# Patient Record
Sex: Female | Born: 1945
Health system: Southern US, Community
[De-identification: ages and names within clinical notes are randomized; demographics above are authoritative.]

## PROBLEM LIST (undated history)

## (undated) DIAGNOSIS — H269 Unspecified cataract: Secondary | ICD-10-CM

## (undated) DIAGNOSIS — Z8489 Family history of other specified conditions: Secondary | ICD-10-CM

## (undated) DIAGNOSIS — I10 Essential (primary) hypertension: Secondary | ICD-10-CM

## (undated) DIAGNOSIS — I2510 Atherosclerotic heart disease of native coronary artery without angina pectoris: Secondary | ICD-10-CM

## (undated) DIAGNOSIS — M1990 Unspecified osteoarthritis, unspecified site: Secondary | ICD-10-CM

## (undated) DIAGNOSIS — D649 Anemia, unspecified: Secondary | ICD-10-CM

## (undated) DIAGNOSIS — E785 Hyperlipidemia, unspecified: Secondary | ICD-10-CM

## (undated) DIAGNOSIS — E119 Type 2 diabetes mellitus without complications: Secondary | ICD-10-CM

## (undated) DIAGNOSIS — I119 Hypertensive heart disease without heart failure: Secondary | ICD-10-CM

## (undated) DIAGNOSIS — D126 Benign neoplasm of colon, unspecified: Secondary | ICD-10-CM

## (undated) DIAGNOSIS — K219 Gastro-esophageal reflux disease without esophagitis: Secondary | ICD-10-CM

## (undated) DIAGNOSIS — N184 Chronic kidney disease, stage 4 (severe): Secondary | ICD-10-CM

## (undated) DIAGNOSIS — I639 Cerebral infarction, unspecified: Secondary | ICD-10-CM

## (undated) DIAGNOSIS — Z9289 Personal history of other medical treatment: Secondary | ICD-10-CM

## (undated) HISTORY — DX: Chronic kidney disease, stage 4 (severe): N18.4

## (undated) HISTORY — PX: REDUCTION MAMMAPLASTY: SUR839

## (undated) HISTORY — DX: Anemia, unspecified: D64.9

## (undated) HISTORY — DX: Type 2 diabetes mellitus without complications: E11.9

## (undated) HISTORY — DX: Cerebral infarction, unspecified: I63.9

## (undated) HISTORY — DX: Unspecified osteoarthritis, unspecified site: M19.90

## (undated) HISTORY — DX: Unspecified cataract: H26.9

## (undated) HISTORY — DX: Hyperlipidemia, unspecified: E78.5

## (undated) HISTORY — DX: Benign neoplasm of colon, unspecified: D12.6

## (undated) HISTORY — PX: TONSILLECTOMY: SUR1361

## (undated) HISTORY — DX: Gastro-esophageal reflux disease without esophagitis: K21.9

## (undated) HISTORY — PX: ECTOPIC PREGNANCY SURGERY: SHX613

---

## 1982-11-19 HISTORY — PX: BARIATRIC SURGERY: SHX1103

## 1982-11-19 HISTORY — PX: CHOLECYSTECTOMY OPEN: SUR202

## 1983-11-20 HISTORY — PX: APPENDECTOMY: SHX54

## 1986-11-19 HISTORY — PX: ABDOMINAL HYSTERECTOMY: SHX81

## 1999-11-16 ENCOUNTER — Emergency Department (HOSPITAL_COMMUNITY): Admission: EM | Admit: 1999-11-16 | Discharge: 1999-11-16 | Payer: Self-pay | Admitting: Emergency Medicine

## 1999-12-30 ENCOUNTER — Emergency Department (HOSPITAL_COMMUNITY): Admission: EM | Admit: 1999-12-30 | Discharge: 1999-12-30 | Payer: Self-pay | Admitting: Emergency Medicine

## 1999-12-30 ENCOUNTER — Encounter: Payer: Self-pay | Admitting: Emergency Medicine

## 2000-05-14 ENCOUNTER — Encounter: Admission: RE | Admit: 2000-05-14 | Discharge: 2000-08-12 | Payer: Self-pay | Admitting: Internal Medicine

## 2001-09-02 ENCOUNTER — Encounter: Admission: RE | Admit: 2001-09-02 | Discharge: 2001-12-01 | Payer: Self-pay | Admitting: Family Medicine

## 2002-11-21 ENCOUNTER — Other Ambulatory Visit: Admission: RE | Admit: 2002-11-21 | Discharge: 2002-11-21 | Payer: Self-pay | Admitting: Family Medicine

## 2003-02-25 ENCOUNTER — Ambulatory Visit (HOSPITAL_COMMUNITY): Admission: RE | Admit: 2003-02-25 | Discharge: 2003-02-25 | Payer: Self-pay | Admitting: Family Medicine

## 2003-10-27 ENCOUNTER — Inpatient Hospital Stay (HOSPITAL_COMMUNITY): Admission: EM | Admit: 2003-10-27 | Discharge: 2003-10-29 | Payer: Self-pay | Admitting: Emergency Medicine

## 2003-10-27 ENCOUNTER — Encounter: Payer: Self-pay | Admitting: Cardiology

## 2004-01-01 ENCOUNTER — Emergency Department (HOSPITAL_COMMUNITY): Admission: EM | Admit: 2004-01-01 | Discharge: 2004-01-01 | Payer: Self-pay | Admitting: *Deleted

## 2004-11-27 ENCOUNTER — Encounter: Admission: RE | Admit: 2004-11-27 | Discharge: 2004-11-27 | Payer: Self-pay | Admitting: Family Medicine

## 2004-11-29 ENCOUNTER — Encounter: Admission: RE | Admit: 2004-11-29 | Discharge: 2004-11-29 | Payer: Self-pay | Admitting: Gastroenterology

## 2005-11-15 ENCOUNTER — Inpatient Hospital Stay (HOSPITAL_COMMUNITY): Admission: EM | Admit: 2005-11-15 | Discharge: 2005-11-16 | Payer: Self-pay | Admitting: Emergency Medicine

## 2006-02-26 ENCOUNTER — Other Ambulatory Visit: Admission: RE | Admit: 2006-02-26 | Discharge: 2006-02-26 | Payer: Self-pay | Admitting: Internal Medicine

## 2006-10-22 ENCOUNTER — Encounter: Admission: RE | Admit: 2006-10-22 | Discharge: 2006-10-22 | Payer: Self-pay | Admitting: Internal Medicine

## 2007-05-23 ENCOUNTER — Ambulatory Visit: Payer: Self-pay | Admitting: Cardiology

## 2007-05-23 ENCOUNTER — Inpatient Hospital Stay (HOSPITAL_COMMUNITY): Admission: EM | Admit: 2007-05-23 | Discharge: 2007-05-26 | Payer: Self-pay | Admitting: Emergency Medicine

## 2007-05-26 ENCOUNTER — Encounter (INDEPENDENT_AMBULATORY_CARE_PROVIDER_SITE_OTHER): Payer: Self-pay | Admitting: Gastroenterology

## 2007-05-28 ENCOUNTER — Ambulatory Visit: Payer: Self-pay | Admitting: Internal Medicine

## 2007-07-10 ENCOUNTER — Ambulatory Visit: Payer: Self-pay | Admitting: Cardiovascular Disease

## 2009-08-22 ENCOUNTER — Emergency Department (HOSPITAL_COMMUNITY): Admission: EM | Admit: 2009-08-22 | Discharge: 2009-08-23 | Payer: Self-pay | Admitting: Emergency Medicine

## 2009-10-04 ENCOUNTER — Other Ambulatory Visit: Admission: RE | Admit: 2009-10-04 | Discharge: 2009-10-04 | Payer: Self-pay | Admitting: Obstetrics and Gynecology

## 2010-06-08 ENCOUNTER — Encounter: Admission: RE | Admit: 2010-06-08 | Discharge: 2010-06-08 | Payer: Self-pay | Admitting: Internal Medicine

## 2010-12-10 ENCOUNTER — Encounter: Payer: Self-pay | Admitting: Internal Medicine

## 2011-02-22 LAB — POCT I-STAT, CHEM 8
Calcium, Ion: 1.15 mmol/L (ref 1.12–1.32)
Glucose, Bld: 366 mg/dL — ABNORMAL HIGH (ref 70–99)
HCT: 40 % (ref 36.0–46.0)
Hemoglobin: 13.6 g/dL (ref 12.0–15.0)

## 2011-02-22 LAB — URINALYSIS, ROUTINE W REFLEX MICROSCOPIC
Ketones, ur: NEGATIVE mg/dL
Leukocytes, UA: NEGATIVE
Nitrite: NEGATIVE
Protein, ur: NEGATIVE mg/dL

## 2011-02-22 LAB — GLUCOSE, CAPILLARY: Glucose-Capillary: 432 mg/dL — ABNORMAL HIGH (ref 70–99)

## 2011-04-03 NOTE — Assessment & Plan Note (Signed)
[place]                            CARDIOLOGY OFFICE NOTE   NAME:Alicia [name], Alicia [name]                        MRN:          [id number]  DATE:07/10/2007                            DOB:          29-Oct-1946    Alicia [name] was seen in follow up at the [facility] cardiology office on  July 10, 2007. [name] is a 65 year old woman who was admitted to the  hospital in July of this year with atypical chest pain. She ruled out  for myocardial infarction and ultimately was thought to have a GI  etiology of her pain. She was evaluated in the hospital by Dr. Benson Norway and  underwent an EGD. She has seen him in follow up as an outpatient and has  been treated for gastritis. She informs me that she has had much less  chest and epigastric discomfort since her return home. She is feeling  better. Her pain generally occurs at night when she is laying down and  feels like a squeezing sensation. She has not had any exertional  symptoms. She denies dyspnea, orthopnea, or PND. She continues to  trouble with blood pressure control and tells me that her blood pressure  today of 160/99 is a good reading for her. Also of note, during her  hospitalization she was found to have markedly elevated total CK with  normal NB fraction. Her peak CK was 2519 with an MB of 5.4. Her  troponins were normal. Her CK elevation was thought to be secondary to  statin use and her statin was discontinued.   CURRENT MEDICATIONS:  1. Micardis 40 mg daily.  2. Cardizem LA 240 mg daily.  3. Nexium 40 mg daily.  4. Glimepiride 2 mg twice daily.  5. Sucralfate 1 mg 2 twice daily.  6. Metformin 500 mg 2 twice daily.   ALLERGIES:  CONTRAST MEDIA, SULFA, and IODINE.   PHYSICAL EXAMINATION:  GENERAL:  The patient is alert and oriented. She  is in no acute distress. She is an obese African-American woman.  VITAL SIGNS:  Weight 254 pounds, blood pressure 160/99, heart rate 83,  respiratory rate 16.  HEENT:   Normal.  NECK:  Normal carotid upstrokes without bruits. Jugular venous pressure  is normal.  LUNGS:  Clear to auscultation bilaterally.  HEART:  Regular rate and rhythm with an S4 gallop. There are no murmurs.  ABDOMEN:  Obese, soft, nontender, no organomegaly.  EXTREMITIES:  No cyanosis, clubbing, or edema. Peripheral pulses are 2+  and equal throughout.   ASSESSMENT:  Alicia [name] is stable from a cardiac standpoint. Her cardiac  issues are as follows:  1. Atypical chest pain. Suspect GI etiology. No further cardiac workup      necessary at this time. She has had nuclear stress studies in the      past that have been normal.  2. Hypertension. Her control is suboptimal. I am not sure how      compliant she is. She is currently on Micardis and Cardizem. I took      the liberty of starting her  on hydrochlorothiazide to see if her      hypertension may be volume sensitive and improved with low dose      diuretic. I advised that her glycemic control would have to be      watched closely. She has close follow up with Dr. Lysle Rubens next week.      If she does not tolerate hydrochlorothiazide or it is ineffective,      other considerations might include a beta blocker or Clonidine.  3. Dyslipidemia. Her lipid panel in the hospital showed a cholesterol      of 161, HDL 41, LDL 111. This was drawn with the patient on therapy      with Crestor. I would not be in favor of another trial of statins,      as she presented to the hospital with marked elevation of her CK,      as well as renal insufficiency. May consider the addition of Zetia,      but I will leave this to Dr. Glenna Durand discretion.   For follow up, I will see Alicia [name] on as needed basis. She closely  follows with Dr. Lysle Rubens and I would be happy to see her back at any time  if I can be of assistance regarding her cardiovascular problems.     Juanda Bond. Burt Knack, MD  Electronically Signed    MDC/MedQ  DD: 07/11/2007  DT: 07/12/2007  Job  #: [id number]   cc:   Tory Emerald. Benson Norway, MD  Wenda Low, MD

## 2011-04-03 NOTE — H&P (Signed)
NAMEELECTA, VOIROL NO.:  [id number]   MEDICAL RECORD NO.:  [id number]          PATIENT TYPE:  INP   LOCATION:  1827                         FACILITY:  [facility]   PHYSICIAN:  Darryl D. Prime, MD    DATE OF BIRTH:  1946-02-18   DATE OF ADMISSION:  05/23/2007  DATE OF DISCHARGE:                              HISTORY & PHYSICAL   The patient's referral, consult, and possible admission was from Dr.  Eulis Foster, ER.  The patient's primary care physician is Dr. Lysle Rubens.  The  patient is full code.  The patient and the patient's sister who is a  nurse are the primary historians.  Total visit time was approximately 52  minutes.   CHIEF COMPLAINT:  Chest pain and vomiting.   HISTORY OF PRESENT ILLNESS:  Ms. Ebany Ravindran is a 65 year old female  with a history of diabetes for the last 9 years, non-insulin-dependent  with no complications.  Got a history of hyperlipidemia, history of  obesity, and history of hypertension.  History of angina chest pain  syndrome with a stress test in December 2006; technetium Myoview  exercise with showing ejection fraction of 64% with no evidence of  infarct or ischemia.  History of a stress test; technetium Persantine  with an ejection fraction of 57% with no evidence of infarct or  ischemia, or wall motion abnormalities.  History of possible TIA, unable  to talk with right-sided weakness, since resolved.  This was in 2000.  Presents with chest pain and vomiting.  The patient notes she has  recently been taking a diet pill.  She is unsure of what type, but has  since had apparent palpitations with it and nausea.  The patient notes  persistent nausea for the last 5 to 6 days and also with emesis, non  bloody, non bilious, which has prevented her from taking her medicine  sometimes and sometimes throwing up her medication, particularly on the  day of admission.  The patient notes she was at the ball game and  approximately 6:30 p.m. she had and  episode of squeezing chest pain  substernally with radiation to the right arm, gradual, with the worst  pain being 9/10.  It occurred at rest and was associated with shortness  of breath, nausea, vomiting, diaphoresis, and, per her sister who is a  nurse, a brief episode of possible syncope before EMS saw here.  When  EMS aroused her, they checked her blood sugar, which was 164.  She was  noted to be tachycardic.  The patient was also given sublingual  nitroglycerin, she notes, in the ambulance, which relieved the pain  almost completely.  The patient notes the pain returned until being  given aspirin, morphine, and nitroglycerin drip in the emergency room  with resolution of the chest pain to 0/10 and also resolution of nausea  symptoms and shortness of breath symptoms.  The patient was also given  IV fluids, oxygen, and Zofran in the emergency room.  The patient denies  any recent history of paroxysmal nocturnal dyspnea or  any orthopnea.  The patient denies any recent travel.  She does note recent increase in  lower extremity edema.   PAST MEDICAL HISTORY:  She has never had a catheterization.  Never had a  CABG.  She had an echocardiogram on October 27, 2003 showing an ejection  fraction of 55-65% with mildly dilated left atrium, otherwise  unremarkable echo.  She has a past medical history as above.  Also  history of gastroesophageal reflux disease, history of toxemia with 2  pregnancies, history of total abdominal hysterectomy and bilateral  salpingeal oophorectomy in 1987, status post cholecystectomy.  She is  also status post tonsillectomy.  She has had breast reduction surgery  bilaterally.  She is status post appendectomy.   MEDICATIONS:  She is unsure of the doses, but she is on Glucophage,  Glipizide, Crestor, Cardizem, Micardis, Nexium, and Altace.   SOCIAL HISTORY:  She lives with her sister.  She has 3 grown children.  She never smoked.  She had a history of tobacco abuse  20 years ago.  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  She is  currently working on her doctorate in education as well.   FAMILY HISTORY:  Mother passes away of a stroke at 54.  Father passed  away of a stroke at 56.  She has multiple siblings with 4 sisters, all  of whom have hypertension.  She also has 2 sisters with diabetes.  The  patient has been recently trying to lose weight and is apparently on a  diet drug.  She is unsure of the name of the drug.   REVIEW OF SYSTEMS:  EYES:  Concerning eyes and vision, she wears  eyeglasses.  EARS:  Ears and hearing, she has had a loss of hearing with  no buzzing in the ears.  NOSE AND THROAT:  No hoarseness, excessive  wheezing, or problems with blocked nasal passages.  RESPIRATORY:  No  wheezes or large quantities even though excessive cough, but does note  night sweats and pain with breathing today.  NEUROLOGICAL:  She is  notable for dizziness and numbing and tingling in the body diffusely and  also in the right hand today, but no fading spells, seizures, or  shaking.  No severe or frequent headaches.  CARDIOVASCULAR:  Noted for  as above, also high blood pressure.  No history of low blood pressure,  varicose veins, or heart murmur.  GASTROINTESTINAL:  Notable for  frequent nausea and vomiting, loss of appetite, and a history of  diabetes, but no gallbladder disease currently.  No hemorrhoids or  problems with diarrhea.  She does have problems with acid reflux.  GENITOURINARY:  She had dark urine in color with no malodor, but there  are no problems with bloody urine or urinary incontinence.  GENITAL:  Female.  She has had a hysterectomy.  No problems with tubal infections  or breast pain.  MUSCULOSKELETAL:  Positive for arthritis in her knees,  but no history of polio, sciatic, or spine abnormalities.  It is  positive for bursitis.  EMOTION AND PSYCHOLOGIC:  She has had problems  with excessive worrying, anxiety,  and also difficulty sleeping.  No  nervous exhaustion.  No frequent nightmares.  The patient other 11-point  review of systems is negative unless dictated above.   PHYSICAL EXAMINATION:  VITAL SIGNS:  Temperature is 98.4 with a pulse of  128 on admission, respiratory rate of 22, blood pressure of  154/89, sats  are 98% on room air.  GENERAL:  She is a well-developed female lying in bed in mild distress.  Does not look chronically ill.  EYES:  Her pupils are equal, round, and reactive to light.  Extraocular  movements are intact with anicteric sclerae.  OROPHARYNX:  Dry with no posterior oropharyngeal lesions.  NECK:  Supple with no lymphadenopathy or thyromegaly.  No carotid  bruits.  No jugular venous distension.  CARDIOVASCULAR EXAM:  Regular rhythm with a faster rate with no murmurs,  rubs, or gallops.  Normal S1, S2.  No S3 or S4 is noted.  ABDOMEN:  Soft, nontender, nondistended.  No hepatosplenomegaly.  Normoactive bowel sounds.  EXTREMITIES:  No clubbing or cyanosis.  There is 1+ bilateral lower  extremity edema.  NEUROLOGIC EXAM:  Cranial nerves II-XII are grossly intact.  Strength  and sensation are grossly intact.   LABORATORY DATA:  Chest x-ray is unremarkable.  EKG shows sinus  tachycardia at ventricular rate of 118 with a normal axis, P-R interval  of 167, QRS of 108, and QT corrected of 461 msec, with ST-T  abnormalities anterolaterally, which are different compared to EKG  November 16, 2005.  Venous blood gas showed a pH of 7.514, CO2 of 31.5,  bicarb of 25.4.  Urinalysis shows 80 ketones with moderate bilirubin and  leukocyte esterase small, protein 100, white blood cells 3-6, bacteria  few, and squamous cells few.  Urine drug screen was negative.  White  count of 7.2 and a hemoglobin of 12.1, hematocrit of 38.7, platelets  254, lymphocytes 24.  Sodium of 144, potassium of 2.7, chloride of 109,  bicarb of 32, BUN of 32, and a creatinine of 1.8, glucose of 177.   Creatinine was 1 in 2006.  Cardiac markers were unremarkable.  Her INR  of 1.1, PT of 14.1, PTT of 29.  She had a D-dimer that was elevated at  0.98.   ASSESSMENT/PLAN:  The patient with a history of chest pain today, has  been recently on diet pills, which is concerning for sympathomimetic  diet weight loss pills, who presents with shortness of breath,  tachycardia, and nausea and vomiting as well.  The concerns are for  acute coronary syndrome or pulmonary thromboembolus.  This may, however,  be related to the sympathomimetic, including nausea and vomiting, acute  renal failure with volume overload, and chest pain and possible  tachycardia-mediated cardiomyopathy.  She will be admitted to a  telemetry bed.  We will get a CT scan to rule out PE and she will placed  on heparin in the meantime.  For her vomiting, we will place her on  antiemetics and IV fluids and we will get a urine culture, liver  function tests, and amylase and lipase.  For rule out MI, we will get  cardiac markers x3 total.  We will check a magnesium.  She will be  n.p.o. after midnight for possible coronary-blood-flow test.  For rule  out arrhythmias, she will be on telemetry.  For acute renal failure, we  will hold the Altace and we will give her IV fluids, despite concern for  possible volume overload, as she apparently is profoundly  intravascularly depleted.  For her hypertension, we will control her  blood pressures with nitroglycerin drip, sublingual nitroglycerin, and  she will receive beta blockers.      Darryl D. Prime, MD  Electronically Signed     DDP/MEDQ  D:  05/23/2007  T:  05/24/2007  Job:  [id number]

## 2011-04-03 NOTE — Discharge Summary (Signed)
NAMEOPHA, Alicia [name] NO.:  [name]   MEDICAL RECORD NO.:  [id number]          PATIENT TYPE:  INP   LOCATION:  2034                         FACILITY:  [facility]   PHYSICIAN:  Juanda Bond. Burt Knack, MD  DATE OF BIRTH:  1946/11/09   DATE OF ADMISSION:  05/23/2007  DATE OF DISCHARGE:  05/26/2007                         DISCHARGE SUMMARY - REFERRING   DISCHARGE DIAGNOSIS:  1. Chest pain and epigastric discomfort of unknown etiology.  2. Elevated CK total may be related to statin  therapy.  3. Elevated amylase may be related to vomiting, microcytic anemia with      a history of heme-positive stools, gastroesophageal reflux disease,      status post bariatric surgery with stapling in 1984, renal      insufficiency secondary to dehydration, decreased TSH with a normal      free T4 and low free T3, hyperglycemia with uncontrolled diabetes.   PROCEDURES PERFORMED:  EGD by Dr. Benson Norway.   BRIEF HISTORY:  Alicia [name] is a 65 year old female who presented to the  emergency room with chest discomfort and vomiting.  She has recently  began taking a diet pill and is associated with these palpitations and  nausea. The nausea has persisted in the last 5-6 days. This has  prevented her from taking some of her medications. On the evening of  admission approximately 6:30 while at a baseball game, she had episode  of squeezing and chest discomfort radiating to the right arm associated  with shortness of breath, nausea, vomiting, diaphoresis.  Her sister,  who is a Marine scientist, thought she might have had a brief syncopal episode  before EMS saw her.  Blood sugar was 164.  On arrival, she was  tachycardia.  She was given sublingual nitroglycerin which almost  relieved the discomfort.  However, the discomfort reoccurred and  resolved after receiving aspirin, morphine, IV nitroglycerin.   PAST MEDICAL HISTORY:  Is notable for:  1. Chronic chest discomfort with most recent echocardiogram in 2004  showing EF of 55-65%.  Last stress test was in 2006 showing an EF      of 64% with no ischemia.  2. She has a history of non-insulin-dependent diabetes for 9 years.  3. Hyperlipidemia.  4. Obesity.  5. Hypertension.  6. Chronic chest pain.  7. GERD/  8. Toxemia with two pregnancies.  9. TAH-BSO.  10.Cholecystectomy.  11.Tonsillectomy.  12.Breast reduction surgery.  13.Appendectomy.  14.Remote tobacco.   LABORATORY:  Chest x-ray on July 4 showed no active disease.   Abdominal ultrasound showed a prior cholecystectomy.  No evidence of  biliary ductal dilatation, a 3.3 cm mass in the central liver.   Abdominal MRI without and with contrast was recommended for further  characterization.   The admission hemoglobin 14.3, hematocrit 42.0, MCV slightly low at  67.5, platelets 258, WBC 7.3.  Prior to discharge, hemoglobin 9.8,  hematocrit 30.9, MCV 66.9, platelets 216, WBC 6.6.  ESR was 22. Percent  reticulocyte 1.2.  RBCs 4.55. Absolute reticulocyte 54.6.  PTT 29, PT  14.1. D-dimer 0.98.  I-STAT  showed a sodium of 144, potassium 3.7, BUN  32, creatinine 1.8, glucose 177.  On July 5, BUN 36, creatinine 2.05.  Indirect bilirubin was slightly elevated at 1.0.  AST was 49. Phosphorus  was slightly high at 6.4 with magnesium of 2.4.  Prior to discharge,  sodium is 138, potassium 3.4, BUN 15, creatinine 1.13, glucose 113.  Amylase was 224.  Hemoglobin A1c 9.8. Lipase 28.  CK totals were  elevated at 2519 and 1786 with normal relative indices.  Troponins were  within normal limits.  BNP was less than 30.  Total cholesterol was 161,  triglycerides 46, HDL 41, LDL 111.  TSH was low at 0.334.  However, free  T4 was 1.29, and free T3 was 1.9.  Iron was 49, TIBC 269, percent  saturation 18, UIBC 220, ferritin 24, transferrin 253. Urine drug screen  on presentation was positive for opiates.  Urinalysis showed a moderate  amount of bilirubin and ketones, protein. Urine culture did not show any   growth.   EKGs showed sinus bradycardia, normal sinus rhythm, normal axis. QTC was  507.  Nonspecific ST-T wave changes.   HOSPITAL COURSE:  The patient was admitted by Dr. Belva Chimes and placed on IV  heparin. Overnight she was complaining of epigastric as well as chest  discomfort.  Her CK-MBs were slightly abnormal but did not indicate a  cardiac pattern compatible with skeletal injury. Dr. Harrington Challenger noted that she  did have a recent minor fall.  Given her epigastric tenderness, it was  felt that she should undergo a GI evaluation.  Given her renal  insufficiency, she was hydrated, and Glucophage, glipizide, and Micardis  were held. Given her elevated CKs, her Crestor was also discontinued.  She was placed on sliding scale especially given her hemoglobin A1c was  elevated.  GI consultation was obtained on May 25, 2007..  They  recommended continued proton pump inhibitor, iron studies, and agreed  with holding Crestor dietary agent and monitor CPKs. Dr. Henrene Pastor arranged  EGD with Dr. Benson Norway.  This was performed on May 26, 2007 and noted gastric  stapling with the partition portion erythema noted.  Felt this may be  related to nausea and vomiting.  Biopsies were taking. It was felt that  she could be discharged from a GI standpoint with outpatient followup.  They did start sucralfate 2 grams b.i.d. in addition to her Nexium.  Dr.  Benson Norway stated that the patient had been scheduled for EGD colonoscopy in  the past.  However, she has never had these procedures.  Dr. Burt Knack  agreed with Dr. Alan Ripper opinion that her elevated CK did not show a  cardiac pattern.  He felt after her EGD she may be discharged home with  outpatient followup with her primary care physician, especially  in  regards to her hyperglycemia and elevated hemoglobin A1c.  She may  benefit from Lantus.   DISPOSITION:  The patient is discharged home.  Her activity is not  restricted.  She was asked to continue low-sodium heart-healthy  ADA  diet.   Her new medications include:  1. Sucralfate 2 grams b.i.d.  2. She was asked to continue metformin 1000 mg b.i.d.  3. Cardizem CD 240 daily.  4. Micardis 40 mg daily.  5. Glimepiride 2 mg b.i.d.  6. She was advised not to take Renovyl (diet pill) and Crestor.   She was asked to call her primary care physician, Dr. Lysle Rubens, for 1-2  week followup appointment in  regards to her hyperglycemia and decreased  thyroid.  She was also asked to call Dr. Benson [facility] at [facility]  for a 2-week followup appointment per his instructions left in the  chart.  Dr. Burt Knack will see her at his first available appointment  on June 26, 2007, at 3:15 with copies sent to her primary care  physician. Blood work will consistent of a BMET and CK total for  followup.  She was asked to bring all medications to all appointments.   Discharge time 40 minutes.      Alicia Nimrod, PA-C      Juanda Bond. Burt Knack, MD  Electronically Signed    EW/MEDQ  D:  05/26/2007  T:  05/26/2007  Job:  [id number]   cc:   Wenda Low, MD  Tory Emerald. Benson Norway, MD

## 2011-06-28 ENCOUNTER — Emergency Department (HOSPITAL_COMMUNITY): Payer: No Typology Code available for payment source

## 2011-06-28 ENCOUNTER — Emergency Department (HOSPITAL_COMMUNITY)
Admission: EM | Admit: 2011-06-28 | Discharge: 2011-06-28 | Disposition: A | Discharge status: Home / Self Care | Payer: No Typology Code available for payment source | Attending: Emergency Medicine | Admitting: Emergency Medicine

## 2011-06-28 DIAGNOSIS — Z79899 Other long term (current) drug therapy: Secondary | ICD-10-CM | POA: Insufficient documentation

## 2011-06-28 DIAGNOSIS — M542 Cervicalgia: Secondary | ICD-10-CM | POA: Insufficient documentation

## 2011-06-28 DIAGNOSIS — E119 Type 2 diabetes mellitus without complications: Secondary | ICD-10-CM | POA: Insufficient documentation

## 2011-06-28 DIAGNOSIS — I10 Essential (primary) hypertension: Secondary | ICD-10-CM | POA: Insufficient documentation

## 2011-06-28 DIAGNOSIS — R209 Unspecified disturbances of skin sensation: Secondary | ICD-10-CM | POA: Insufficient documentation

## 2011-06-28 DIAGNOSIS — S139XXA Sprain of joints and ligaments of unspecified parts of neck, initial encounter: Secondary | ICD-10-CM | POA: Insufficient documentation

## 2011-09-04 LAB — DIFFERENTIAL
Basophils Absolute: 0.1
Lymphocytes Relative: 24
Lymphs Abs: 1.8
Monocytes Relative: 7

## 2011-09-04 LAB — BASIC METABOLIC PANEL
BUN: 36 — ABNORMAL HIGH
CO2: 27
CO2: 28
Calcium: 8 — ABNORMAL LOW
Calcium: 8.3 — ABNORMAL LOW
Calcium: 8.3 — ABNORMAL LOW
Chloride: 107
Chloride: 108
Creatinine, Ser: 1.13
Creatinine, Ser: 2.05 — ABNORMAL HIGH
GFR calc Af Amer: 32 — ABNORMAL LOW
GFR calc Af Amer: 41 — ABNORMAL LOW
GFR calc Af Amer: 60 — ABNORMAL LOW
Glucose, Bld: 178 — ABNORMAL HIGH
Potassium: 3.6
Sodium: 141
Sodium: 142

## 2011-09-04 LAB — HEPATIC FUNCTION PANEL
ALT: 22
Alkaline Phosphatase: 110
Bilirubin, Direct: 0.1
Indirect Bilirubin: 1 — ABNORMAL HIGH
Total Protein: 6.5

## 2011-09-04 LAB — RAPID URINE DRUG SCREEN, HOSP PERFORMED
Amphetamines: NOT DETECTED
Barbiturates: NOT DETECTED
Tetrahydrocannabinol: NOT DETECTED

## 2011-09-04 LAB — POCT CARDIAC MARKERS
CKMB, poc: 5
CKMB, poc: 5.1
CKMB, poc: 5.9
Operator id: 272551
Troponin i, poc: <0.05
Troponin i, poc: <0.05

## 2011-09-04 LAB — IRON AND TIBC
Saturation Ratios: 18 — ABNORMAL LOW
TIBC: 269

## 2011-09-04 LAB — URINE CULTURE
Colony Count: NO GROWTH
Special Requests: NEGATIVE

## 2011-09-04 LAB — URINALYSIS, ROUTINE W REFLEX MICROSCOPIC
Nitrite: NEGATIVE
Protein, ur: 100 — AB
Specific Gravity, Urine: 1.036 — ABNORMAL HIGH
Urobilinogen, UA: 1

## 2011-09-04 LAB — HEMOGLOBIN AND HEMATOCRIT, BLOOD
HCT: 31.4 — ABNORMAL LOW
Hemoglobin: 9.8 — ABNORMAL LOW

## 2011-09-04 LAB — CARDIAC PANEL(CRET KIN+CKTOT+MB+TROPI)
CK, MB: 4.2 — ABNORMAL HIGH
Total CK: 1780 — ABNORMAL HIGH
Troponin I: 0.03

## 2011-09-04 LAB — LIPID PANEL
Cholesterol: 161
HDL: 41
LDL Cholesterol: 111 — ABNORMAL HIGH
Triglycerides: 46

## 2011-09-04 LAB — TSH: TSH: 0.334 — ABNORMAL LOW

## 2011-09-04 LAB — I-STAT 8, (EC8 V) (CONVERTED LAB)
Acid-Base Excess: 3 — ABNORMAL HIGH
Glucose, Bld: 177 — ABNORMAL HIGH
HCT: 42
Hemoglobin: 14.3
Operator id: 272551
Potassium: 3.7
Sodium: 144
TCO2: 26

## 2011-09-04 LAB — CBC
HCT: 38.7
Hemoglobin: 9.3 — ABNORMAL LOW
MCHC: 31.3
Platelets: 258
RBC: 4.33
RBC: 4.61
RDW: 15.9 — ABNORMAL HIGH
WBC: 6.6

## 2011-09-04 LAB — RETICULOCYTES: Retic Count, Absolute: 54.6

## 2011-09-04 LAB — URINE MICROSCOPIC-ADD ON

## 2011-09-04 LAB — TRANSFERRIN: Transferrin: 253

## 2011-09-04 LAB — PROTIME-INR
INR: 1.1
INR: 1.1
Prothrombin Time: 14.4

## 2011-09-04 LAB — CK TOTAL AND CKMB (NOT AT ARMC): Total CK: 1786 — ABNORMAL HIGH

## 2011-09-04 LAB — AMYLASE
Amylase: 164 — ABNORMAL HIGH
Amylase: 224 — ABNORMAL HIGH

## 2011-09-04 LAB — TROPONIN I: Troponin I: 0.05

## 2011-10-12 ENCOUNTER — Emergency Department (HOSPITAL_COMMUNITY): Payer: BC Managed Care – PPO

## 2011-10-12 ENCOUNTER — Inpatient Hospital Stay (HOSPITAL_COMMUNITY)
Admission: EM | Admit: 2011-10-12 | Discharge: 2011-10-15 | DRG: 180 | Disposition: A | Discharge status: Home / Self Care | Payer: BC Managed Care – PPO | Attending: Internal Medicine | Admitting: Internal Medicine

## 2011-10-12 ENCOUNTER — Other Ambulatory Visit: Payer: Self-pay

## 2011-10-12 DIAGNOSIS — K219 Gastro-esophageal reflux disease without esophagitis: Secondary | ICD-10-CM | POA: Diagnosis present

## 2011-10-12 DIAGNOSIS — R509 Fever, unspecified: Secondary | ICD-10-CM

## 2011-10-12 DIAGNOSIS — K560 Paralytic ileus: Secondary | ICD-10-CM

## 2011-10-12 DIAGNOSIS — R1012 Left upper quadrant pain: Secondary | ICD-10-CM | POA: Diagnosis present

## 2011-10-12 DIAGNOSIS — I10 Essential (primary) hypertension: Secondary | ICD-10-CM | POA: Diagnosis present

## 2011-10-12 DIAGNOSIS — K56609 Unspecified intestinal obstruction, unspecified as to partial versus complete obstruction: Principal | ICD-10-CM | POA: Diagnosis present

## 2011-10-12 DIAGNOSIS — E876 Hypokalemia: Secondary | ICD-10-CM | POA: Diagnosis present

## 2011-10-12 DIAGNOSIS — Z9884 Bariatric surgery status: Secondary | ICD-10-CM

## 2011-10-12 DIAGNOSIS — E119 Type 2 diabetes mellitus without complications: Secondary | ICD-10-CM | POA: Diagnosis present

## 2011-10-12 DIAGNOSIS — R109 Unspecified abdominal pain: Secondary | ICD-10-CM | POA: Diagnosis present

## 2011-10-12 DIAGNOSIS — E785 Hyperlipidemia, unspecified: Secondary | ICD-10-CM | POA: Diagnosis present

## 2011-10-12 DIAGNOSIS — IMO0002 Reserved for concepts with insufficient information to code with codable children: Secondary | ICD-10-CM | POA: Diagnosis present

## 2011-10-12 DIAGNOSIS — E1165 Type 2 diabetes mellitus with hyperglycemia: Secondary | ICD-10-CM | POA: Diagnosis present

## 2011-10-12 LAB — DIFFERENTIAL
Eosinophils Relative: 1 % (ref 0–5)
Lymphocytes Relative: 17 % (ref 12–46)
Lymphs Abs: 1.4 10*3/uL (ref 0.7–4.0)
Monocytes Relative: 5 % (ref 3–12)
Neutro Abs: 6.6 10*3/uL (ref 1.7–7.7)

## 2011-10-12 LAB — COMPREHENSIVE METABOLIC PANEL
AST: 20 U/L (ref 0–37)
Alkaline Phosphatase: 97 U/L (ref 39–117)
CO2: 31 mEq/L (ref 19–32)
Chloride: 101 mEq/L (ref 96–112)
Creatinine, Ser: 0.99 mg/dL (ref 0.50–1.10)
GFR calc non Af Amer: 59 mL/min — ABNORMAL LOW (ref >90)
Potassium: 3.3 mEq/L — ABNORMAL LOW (ref 3.5–5.1)
Total Bilirubin: 0.4 mg/dL (ref 0.3–1.2)

## 2011-10-12 LAB — URINALYSIS, ROUTINE W REFLEX MICROSCOPIC
Bilirubin Urine: NEGATIVE
Glucose, UA: NEGATIVE mg/dL
Hgb urine dipstick: NEGATIVE
Ketones, ur: NEGATIVE mg/dL
Protein, ur: NEGATIVE mg/dL
pH: 8 (ref 5.0–8.0)

## 2011-10-12 LAB — CBC
HCT: 36.1 % (ref 36.0–46.0)
HCT: 33.1 % — ABNORMAL LOW (ref 36.0–46.0)
Hemoglobin: 10.3 g/dL — ABNORMAL LOW (ref 12.0–15.0)
MCH: 21.9 pg — ABNORMAL LOW (ref 26.0–34.0)
MCHC: 31.1 g/dL (ref 30.0–36.0)
MCV: 69.7 fL — ABNORMAL LOW (ref 78.0–100.0)
MCV: 70.3 fL — ABNORMAL LOW (ref 78.0–100.0)
Platelets: 253 10*3/uL (ref 150–400)
RBC: 5.18 MIL/uL — ABNORMAL HIGH (ref 3.87–5.11)
RBC: 4.71 MIL/uL (ref 3.87–5.11)
WBC: 8.5 10*3/uL (ref 4.0–10.5)

## 2011-10-12 LAB — CARDIAC PANEL(CRET KIN+CKTOT+MB+TROPI)
CK, MB: 3.5 ng/mL (ref 0.3–4.0)
Relative Index: 1.6 (ref 0.0–2.5)
Total CK: 224 U/L — ABNORMAL HIGH (ref 7–177)
Troponin I: <0.3 ng/mL (ref <0.30)

## 2011-10-12 LAB — PROTIME-INR: Prothrombin Time: 13.2 seconds (ref 11.6–15.2)

## 2011-10-12 MED ORDER — ONDANSETRON HCL 4 MG/2ML IJ SOLN
4.0000 mg | Freq: Once | INTRAMUSCULAR | Status: AC
  Administered 2011-10-12: 4 mg via INTRAVENOUS
  Filled 2011-10-12: qty 2

## 2011-10-12 MED ORDER — ONDANSETRON HCL 4 MG PO TABS: 4.0000 mg | ORAL_TABLET | Freq: Four times a day (QID) | ORAL | Status: DC | PRN

## 2011-10-12 MED ORDER — LOSARTAN POTASSIUM 50 MG PO TABS
100.0000 mg | ORAL_TABLET | Freq: Every day | ORAL | Status: DC
  Administered 2011-10-12 – 2011-10-15 (×4): 100 mg via ORAL
  Filled 2011-10-12 (×4): qty 2

## 2011-10-12 MED ORDER — SODIUM CHLORIDE 0.9 % IV SOLN: INTRAVENOUS | Status: DC

## 2011-10-12 MED ORDER — FENTANYL CITRATE 0.05 MG/ML IJ SOLN
100.0000 ug | Freq: Once | INTRAMUSCULAR | Status: AC
  Administered 2011-10-12: 100 ug via INTRAVENOUS
  Filled 2011-10-12: qty 2

## 2011-10-12 MED ORDER — ONDANSETRON HCL 4 MG/2ML IJ SOLN: 4.0000 mg | Freq: Three times a day (TID) | INTRAMUSCULAR | Status: DC | PRN

## 2011-10-12 MED ORDER — HYDROMORPHONE HCL PF 1 MG/ML IJ SOLN
1.0000 mg | INTRAMUSCULAR | Status: DC | PRN
  Administered 2011-10-13 – 2011-10-14 (×3): 1 mg via INTRAVENOUS
  Filled 2011-10-12 (×3): qty 1

## 2011-10-12 MED ORDER — HYDROMORPHONE HCL PF 1 MG/ML IJ SOLN
1.0000 mg | INTRAMUSCULAR | Status: DC | PRN
  Administered 2011-10-12 – 2011-10-13 (×2): 1 mg via INTRAVENOUS
  Filled 2011-10-12 (×2): qty 1

## 2011-10-12 MED ORDER — POTASSIUM CHLORIDE IN NACL 40-0.9 MEQ/L-% IV SOLN
INTRAVENOUS | Status: DC
  Administered 2011-10-13 – 2011-10-14 (×3): via INTRAVENOUS
  Filled 2011-10-12 (×9): qty 1000

## 2011-10-12 MED ORDER — INSULIN ASPART 100 UNIT/ML ~~LOC~~ SOLN
0.0000 [IU] | Freq: Three times a day (TID) | SUBCUTANEOUS | Status: DC
  Administered 2011-10-14: 2 [IU] via SUBCUTANEOUS
  Administered 2011-10-15: 3 [IU] via SUBCUTANEOUS
  Filled 2011-10-12 (×7): qty 3

## 2011-10-12 MED ORDER — HYDRALAZINE HCL 20 MG/ML IJ SOLN
10.0000 mg | Freq: Four times a day (QID) | INTRAMUSCULAR | Status: DC | PRN
  Administered 2011-10-12 – 2011-10-13 (×3): 10 mg via INTRAVENOUS
  Filled 2011-10-12 (×2): qty 1
  Filled 2011-10-12: qty 0.5
  Filled 2011-10-12: qty 1

## 2011-10-12 MED ORDER — DOXAZOSIN MESYLATE 4 MG PO TABS
4.0000 mg | ORAL_TABLET | Freq: Every day | ORAL | Status: DC
  Administered 2011-10-12 – 2011-10-14 (×3): 4 mg via ORAL
  Filled 2011-10-12 (×5): qty 1

## 2011-10-12 MED ORDER — ENOXAPARIN SODIUM 40 MG/0.4ML ~~LOC~~ SOLN
40.0000 mg | SUBCUTANEOUS | Status: DC
  Administered 2011-10-12 – 2011-10-14 (×3): 40 mg via SUBCUTANEOUS
  Filled 2011-10-12 (×4): qty 0.4

## 2011-10-12 MED ORDER — ONDANSETRON HCL 4 MG/2ML IJ SOLN: 4.0000 mg | Freq: Four times a day (QID) | INTRAMUSCULAR | Status: DC | PRN

## 2011-10-12 MED ORDER — ACETAMINOPHEN 325 MG PO TABS
650.0000 mg | ORAL_TABLET | Freq: Four times a day (QID) | ORAL | Status: DC | PRN
  Administered 2011-10-14: 650 mg via ORAL
  Filled 2011-10-12: qty 2

## 2011-10-12 MED ORDER — ACETAMINOPHEN 650 MG RE SUPP: 650.0000 mg | Freq: Four times a day (QID) | RECTAL | Status: DC | PRN

## 2011-10-12 NOTE — ED Notes (Signed)
Pt complains of generalized abd pain for one hour

## 2011-10-12 NOTE — H&P (Signed)
Alicia [name] MRN: [id number] DOB/AGE: February 18, 1946 65 y.o. Primary Care Physician:HUSAIN,KARRAR, MD Admit date: 10/12/2011  PCP Dr. [name] hussain   Chief Complaint: Abdominal pain   HPI: 65 year old female who presents to the ER with a chief complaint of left upper quadrant pain. The pain started at around 11:00 today. The patient denied any nausea vomiting associated with it. She denies any fever chills or rigors. She has noted bright red blood per rectum, and 2 separate occasions. The last episode was yesterday morning. She denies any chest pain per se. She denies any orthopnea paroxysmal nocturnal dyspnea or dependent edema. She had a significantly elevated blood pressure today when she presented to the ER, with systolic blood pressure greater than 200.  Past Medical History/surgical history   Diagnosis Date  . Diabetes mellitus   . Hypertension    morbid obesity Gastric bypass surgery Cholecystectomy Hysterectomy     Prior to Admission medications   Medication Sig Start Date End Date Taking? Authorizing Provider  doxazosin (CARDURA) 4 MG tablet Take 4 mg by mouth at bedtime.     Yes Historical Provider, MD  esomeprazole (NEXIUM) 40 MG capsule Take 40 mg by mouth daily before breakfast.     Yes Historical Provider, MD  losartan (COZAAR) 100 MG tablet Take 100 mg by mouth daily.     Yes Historical Provider, MD  pioglitazone-metformin (ACTOPLUS MET) 15-850 MG per tablet Take 1 tablet by mouth 2 (two) times daily with a meal.     Yes Historical Provider, MD  sitaGLIPtin (JANUVIA) 100 MG tablet Take 100 mg by mouth daily.     Yes Historical Provider, MD    Allergies:  Allergies  Allergen Reactions  . Iodinated Diagnostic Agents   . Sulfa Antibiotics      .SOCIAL HISTORY:  She lives with her sister.  She has 3 grown children.   She never smoked.  She had a history of tobacco abuse 20 years ago.  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  She is   currently  working on her doctorate in education as well.      FAMILY HISTORY:  Mother passes away of a stroke at 58.  Father passed   away of a stroke at 21.  She has multiple siblings with 4 sisters, all   of whom have hypertension.  She also has 2 sisters with diabetes.  The   patient has been recently trying to lose weight and is apparently on a   diet drug.  She is unsure of the name of the drug.       ROS: 14 point review of systems was done as documented in history of present illness  PHYSICAL EXAM: Blood pressure 169/72, pulse 71, temperature 97.8 F (36.6 C), temperature source Oral, resp. rate 22, height 5' 5.5" (1.664 m), weight 113.399 kg (250 lb), SpO2 100.00%. HEENT pupils equal and reactive to light extraocular movements intact Neck supple JVD Cardiovascular regular rate and rhythm no murmurs rubs or gallops Lungs clear to auscultation bilaterally wheezes or crackles or rhonchi Abdomen tender to palpation in the left upper quadrant and in the epigastric region. Neurologic cranials 2 through 12 grossly intact Psychiatric appropriate mood and affect Extremities without cyanosis clubbing or edema  No results found for this or any previous visit (from the past 240 hour(s)).   Results for orders placed during the hospital encounter of 10/12/11 (from the past 48 hour(s))  COMPREHENSIVE  METABOLIC PANEL     Status: Abnormal   Collection Time   10/12/11  4:25 PM      Component Value Range Comment   Sodium 140  135 - 145 (mEq/L)    Potassium 3.3 (*) 3.5 - 5.1 (mEq/L)    Chloride 101  96 - 112 (mEq/L)    CO2 31  19 - 32 (mEq/L)    Glucose, Bld 133 (*) 70 - 99 (mg/dL)    BUN 21  6 - 23 (mg/dL)    Creatinine, Ser 0.99  0.50 - 1.10 (mg/dL)    Calcium 9.8  8.4 - 10.5 (mg/dL)    Total Protein 7.2  6.0 - 8.3 (g/dL)    Albumin 3.9  3.5 - 5.2 (g/dL)    AST 20  0 - 37 (U/L)    ALT 13  0 - 35 (U/L)    Alkaline Phosphatase 97  39 - 117 (U/L)    Total Bilirubin 0.4  0.3 - 1.2 (mg/dL)    GFR  calc non Af Amer 59 (*) >90 (mL/min)    GFR calc Af Amer 68 (*) >90 (mL/min)   LIPASE, BLOOD     Status: Abnormal   Collection Time   10/12/11  4:25 PM      Component Value Range Comment   Lipase 76 (*) 11 - 59 (U/L)   URINALYSIS, ROUTINE W REFLEX MICROSCOPIC     Status: Normal   Collection Time   10/12/11  5:26 PM      Component Value Range Comment   Color, Urine YELLOW  YELLOW     Appearance CLEAR  CLEAR     Specific Gravity, Urine 1.015  1.005 - 1.030     pH 8.0  5.0 - 8.0     Glucose, UA NEGATIVE  NEGATIVE (mg/dL)    Hgb urine dipstick NEGATIVE  NEGATIVE     Bilirubin Urine NEGATIVE  NEGATIVE     Ketones, ur NEGATIVE  NEGATIVE (mg/dL)    Protein, ur NEGATIVE  NEGATIVE (mg/dL)    Urobilinogen, UA 0.2  0.0 - 1.0 (mg/dL)    Nitrite NEGATIVE  NEGATIVE     Leukocytes, UA NEGATIVE  NEGATIVE  MICROSCOPIC NOT DONE ON URINES WITH NEGATIVE PROTEIN, BLOOD, LEUKOCYTES, NITRITE, OR GLUCOSE <1000 mg/dL.    Ct Abdomen Pelvis Wo Contrast  10/12/2011  *RADIOLOGY REPORT*   IMPRESSION: Mildly distended anterior abdominal small bowel loops with adjacent inflammation and small amount of free fluid.  Although a discrete transition point is not identified, developing obstruction is not excluded given this patient's history of gastric bypass.  This also may represent an enteritis.  No evidence of pneumoperitoneum.  Apparently stable right hepatic mass - probably representing an hemangioma.  Original Report Authenticated By: Lura Em, M.D.   Dg Abd 1 View  10/12/2011  *RADIOLOGY REPORT*  Clinical Data: Abdominal pain and nausea  ABDOMEN - 1 VIEW  Comparison: None  Findings:  The bowel gas pattern appears non obstructed.  No dilated loops of small bowel or air-fluid levels identified.  Suture material is identified within the left upper quadrant of the abdomen.  There are cholecystectomy clips in the right upper quadrant the abdomen.  IMPRESSION:  1.  Normal bowel gas pattern.  Original Report  Authenticated By: Angelita Ingles, M.D.    Impression:  Principal Problem:  Hypokalemia Partial small bowel obstruction  Hypertension  Dyslipidemia  Abdominal pain  Diabetes mellitus  Gastric bypass status for obesity  Plan: This is a 65 year old morbidly obese female with history of multiple abdominal surgeries who presents with acute onset of abdominal pain, with essentially normal liver function tests except for a mildly elevated lipase. Her CT scan shows distended small bowel loops without any discrete transition point, suggestive of early developing small bowel obstruction. We will treat her conservatively. We'll keep her n.p.o. Start her on IV fluids. She has described 2 episodes of red blood per rectum. At this point the CBCs pending. We will also check a stat PT/INR.  If her symptoms do not improve consider GI consultation in the morning. She states that she had a colonoscopy 4 years ago which was within normal limits. She is a full code      [facility] 10/12/2011, 6:30 PM

## 2011-10-12 NOTE — ED Notes (Signed)
[id number] Expected date:10/12/11<BR> Expected time: 2:21 PM<BR> Means of arrival:Ambulance<BR> Comments:<BR> GC 40. 60 you abd pain. Stable. 10 min eta

## 2011-10-12 NOTE — ED Notes (Signed)
Pt states she started to have upper quad abdominal pain an hour ago. Pt  C/o nausea denies emesis or loose stool.

## 2011-10-12 NOTE — ED Provider Notes (Signed)
History     CSN: [id number] Arrival date & time: 10/12/2011  2:39 PM   First MD Initiated Contact with Patient 10/12/11 1515      Chief Complaint  Patient presents with  . Abdominal Pain    (Consider location/radiation/quality/duration/timing/severity/associated sxs/prior treatment) HPI Pt states she started to have upper quad abdominal pain an hour ago. Pt C/o nausea denies emesis or loose stool.  No bowel movements today.  He had normal bowel movement yesterday.  No documented fever.  No complaints of chills.  Pain has been persistent and similar to gallbladder pain she had in the past prior to her cholecystectomy.  Past Medical History  Diagnosis Date  . Diabetes mellitus   . Hypertension     History reviewed. No pertinent past surgical history.  History reviewed. No pertinent family history.  History  Substance Use Topics  . Smoking status: Never Smoker   . Smokeless tobacco: Not on file  . Alcohol Use: No    OB History    Grav Para Term Preterm Abortions TAB SAB Ect Mult Living                  Review of Systems  Constitutional: Negative.   HENT: Negative.   Eyes: Negative.   Cardiovascular: Negative.   Hematological: Negative.   Psychiatric/Behavioral: Negative.   All other systems reviewed and are negative.    Allergies  Sulfa antibiotics  Home Medications   Current Outpatient Rx  Name Route Sig Dispense Refill  . DOXAZOSIN MESYLATE 4 MG PO TABS Oral Take 4 mg by mouth at bedtime.      [name] ESOMEPRAZOLE MAGNESIUM 40 MG PO CPDR Oral Take 40 mg by mouth daily before breakfast.      . LOSARTAN POTASSIUM 100 MG PO TABS Oral Take 100 mg by mouth daily.      [name] PIOGLITAZONE HCL-METFORMIN HCL 15-850 MG PO TABS Oral Take 1 tablet by mouth 2 (two) times daily with a meal.      . SITAGLIPTIN PHOSPHATE 100 MG PO TABS Oral Take 100 mg by mouth daily.        BP 211/113  Pulse 71  Temp(Src) 97.8 F (36.6 C) (Oral)  Resp 22  Ht 5' 5.5" (1.664 m)  Wt 250 lb  (113.399 kg)  BMI 40.97 kg/m2  SpO2 100%  Physical Exam  Nursing note and vitals reviewed. Constitutional: She is oriented to person, place, and time. She appears well-developed and well-nourished. No distress.  HENT:  Head: Normocephalic and atraumatic.  Eyes: Pupils are equal, round, and reactive to light.  Neck: Normal range of motion.  Cardiovascular: Normal rate and intact distal pulses.   Pulmonary/Chest: No respiratory distress.  Abdominal: Normal appearance. She exhibits no distension. There is tenderness. There is no rebound and no guarding.    Musculoskeletal: Normal range of motion.  Neurological: She is alert and oriented to person, place, and time. No cranial nerve deficit.  Skin: Skin is warm and dry. No rash noted.  Psychiatric: She has a normal mood and affect. Her behavior is normal.    ED Course  Procedures (including critical care time)  Labs Reviewed  CBC - Abnormal; Notable for the following:    RBC 5.18 (*)    Hemoglobin 11.3 (*)    MCV 69.7 (*)    MCH 21.8 (*)    RDW 15.8 (*)    All other components within normal limits  COMPREHENSIVE METABOLIC PANEL - Abnormal; Notable for the following:  Potassium 3.3 (*)    Glucose, Bld 133 (*)    GFR calc non Af Amer 59 (*)    GFR calc Af Amer 68 (*)    All other components within normal limits  LIPASE, BLOOD - Abnormal; Notable for the following:    Lipase 76 (*)    All other components within normal limits  HEMOGLOBIN A1C - Abnormal; Notable for the following:    Hemoglobin A1C 6.5 (*)    Mean Plasma Glucose 140 (*)    All other components within normal limits  CARDIAC PANEL(CRET KIN+CKTOT+MB+TROPI) - Abnormal; Notable for the following:    Total CK 224 (*)    All other components within normal limits  CARDIAC PANEL(CRET KIN+CKTOT+MB+TROPI) - Abnormal; Notable for the following:    Total CK 181 (*)    All other components within normal limits  CBC - Abnormal; Notable for the following:    Hemoglobin  10.3 (*)    HCT 33.1 (*)    MCV 70.3 (*)    MCH 21.9 (*)    RDW 15.9 (*)    All other components within normal limits  COMPREHENSIVE METABOLIC PANEL - Abnormal; Notable for the following:    Glucose, Bld 111 (*)    Calcium 8.3 (*)    Total Protein 5.9 (*)    Albumin 3.1 (*)    GFR calc non Af Amer 55 (*)    GFR calc Af Amer 64 (*)    All other components within normal limits  CBC - Abnormal; Notable for the following:    Hemoglobin 9.9 (*)    HCT 31.5 (*)    MCV 70.6 (*)    MCH 22.2 (*)    RDW 16.1 (*)    All other components within normal limits  GLUCOSE, CAPILLARY - Abnormal; Notable for the following:    Glucose-Capillary 110 (*)    All other components within normal limits  DIFFERENTIAL  URINALYSIS, ROUTINE W REFLEX MICROSCOPIC  PROTIME-INR  CARDIAC PANEL(CRET KIN+CKTOT+MB+TROPI)  GLUCOSE, CAPILLARY  GLUCOSE, CAPILLARY  GLUCOSE, CAPILLARY  GLUCOSE, CAPILLARY  POCT CBG MONITORING   Ct Abdomen Pelvis Wo Contrast  10/12/2011  *RADIOLOGY REPORT*  Clinical Data: 65 year old female with abdominal and pelvic pain.  CT ABDOMEN AND PELVIS WITHOUT CONTRAST  Technique:  Multidetector CT imaging of the abdomen and pelvis was performed following the standard protocol without intravenous contrast.  Comparison: 05/26/2007 abdominal ultrasound  Findings: A 2.5 x 3.3 cm hypodense lesion within the right liver (image 19) has a similar appearance to prior ultrasound probably represents a hemangioma. The liver is otherwise unremarkable.  The spleen, pancreas, adrenal glands and kidneys are unremarkable. It is status post cholecystectomy. Please note that parenchymal abnormalities may be missed as intravenous contrast was not administered.  A very small amount of ascites is identified in the perihepatic region and anterior right abdomen.  Evidence of gastric bypass is noted.  There are mildly distended loops of small bowel within the anterior abdomen with a small amount of inflammation and fluid  in the anterior abdomen. No discrete transition point or evidence of pneumoperitoneum noted. No enlarged lymph nodes, biliary dilatation or abdominal aortic aneurysm noted. No acute or suspicious bony abnormalities are present.  IMPRESSION: Mildly distended anterior abdominal small bowel loops with adjacent inflammation and small amount of free fluid.  Although a discrete transition point is not identified, developing obstruction is not excluded given this patient's history of gastric bypass.  This also may represent an enteritis.  No  evidence of pneumoperitoneum.  Apparently stable right hepatic mass - probably representing an hemangioma.  Original Report Authenticated By: Lura Em, M.D.   Dg Abd 1 View  10/12/2011  *RADIOLOGY REPORT*  Clinical Data: Abdominal pain and nausea  ABDOMEN - 1 VIEW  Comparison: None  Findings:  The bowel gas pattern appears non obstructed.  No dilated loops of small bowel or air-fluid levels identified.  Suture material is identified within the left upper quadrant of the abdomen.  There are cholecystectomy clips in the right upper quadrant the abdomen.  IMPRESSION:  1.  Normal bowel gas pattern.  Original Report Authenticated By: Angelita Ingles, M.D.   Acute Abdominal Series  10/13/2011  *RADIOLOGY REPORT*  Clinical Data: Abdominal pain.  ACUTE ABDOMEN SERIES (ABDOMEN 2 VIEW & CHEST 1 VIEW)  Comparison: Abdominal radiograph performed 10/12/2011, and chest radiograph performed 05/23/2007  Findings: The lungs are relatively well-aerated.  Minimal bibasilar opacities likely reflect atelectasis.  There is no evidence of pleural effusion or pneumothorax.  The cardiomediastinal silhouette is within normal limits.  The visualized bowel gas pattern is unremarkable.  Postoperative change is noted at the right upper quadrant and about the gastric fundus.  Stool and air are noted throughout the colon; there is no evidence of small bowel dilatation to suggest obstruction.  No free  intra-abdominal air is identified on the provided upright view. Bilateral vascular calcifications are noted within the abdomen and pelvis.  No acute osseous abnormalities are seen; the sacroiliac joints are unremarkable in appearance.  IMPRESSION:  1.  Unremarkable bowel gas pattern; no free intra-abdominal air seen. 2.  Minimal bibasilar airspace opacities likely reflect atelectasis.  Original Report Authenticated By: Santa Lighter, M.D.     1. Abdominal  pain, other specified site   2. Adynamic ileus       MDM  Possible pancreatitis or early sbo. Will admit to medicine.       Dot Lanes, MD 10/13/11 [phone]

## 2011-10-13 ENCOUNTER — Inpatient Hospital Stay (HOSPITAL_COMMUNITY): Payer: BC Managed Care – PPO

## 2011-10-13 DIAGNOSIS — E876 Hypokalemia: Secondary | ICD-10-CM

## 2011-10-13 LAB — HEMOGLOBIN A1C
Hgb A1c MFr Bld: 6.5 % — ABNORMAL HIGH (ref <5.7)
Mean Plasma Glucose: 140 mg/dL — ABNORMAL HIGH (ref <117)

## 2011-10-13 LAB — CBC
HCT: 31.5 % — ABNORMAL LOW (ref 36.0–46.0)
MCH: 22.2 pg — ABNORMAL LOW (ref 26.0–34.0)
MCV: 70.6 fL — ABNORMAL LOW (ref 78.0–100.0)
RBC: 4.46 MIL/uL (ref 3.87–5.11)
RDW: 16.1 % — ABNORMAL HIGH (ref 11.5–15.5)
WBC: 7.4 10*3/uL (ref 4.0–10.5)

## 2011-10-13 LAB — GLUCOSE, CAPILLARY
Glucose-Capillary: 110 mg/dL — ABNORMAL HIGH (ref 70–99)
Glucose-Capillary: 99 mg/dL (ref 70–99)
Glucose-Capillary: 105 mg/dL — ABNORMAL HIGH (ref 70–99)

## 2011-10-13 LAB — COMPREHENSIVE METABOLIC PANEL
AST: 15 U/L (ref 0–37)
Albumin: 3.1 g/dL — ABNORMAL LOW (ref 3.5–5.2)
Alkaline Phosphatase: 77 U/L (ref 39–117)
BUN: 18 mg/dL (ref 6–23)
Chloride: 106 mEq/L (ref 96–112)
Potassium: 3.5 mEq/L (ref 3.5–5.1)
Total Bilirubin: 0.3 mg/dL (ref 0.3–1.2)
Total Protein: 5.9 g/dL — ABNORMAL LOW (ref 6.0–8.3)

## 2011-10-13 LAB — CARDIAC PANEL(CRET KIN+CKTOT+MB+TROPI)
CK, MB: 2.6 ng/mL (ref 0.3–4.0)
Relative Index: 1.6 (ref 0.0–2.5)
Troponin I: <0.3 ng/mL (ref <0.30)
Troponin I: <0.3 ng/mL (ref <0.30)

## 2011-10-13 MED ORDER — PIPERACILLIN-TAZOBACTAM 3.375 G IVPB: 3.3750 g | Freq: Four times a day (QID) | INTRAVENOUS | Status: DC

## 2011-10-13 MED ORDER — LIP MEDEX EX OINT
TOPICAL_OINTMENT | CUTANEOUS | Status: AC
  Filled 2011-10-13: qty 7

## 2011-10-13 MED ORDER — PIPERACILLIN-TAZOBACTAM 3.375 G IVPB
3.3750 g | Freq: Three times a day (TID) | INTRAVENOUS | Status: DC
  Administered 2011-10-14 – 2011-10-15 (×4): 3.375 g via INTRAVENOUS
  Filled 2011-10-13 (×8): qty 50

## 2011-10-13 MED ORDER — CLONIDINE HCL 0.2 MG PO TABS
0.2000 mg | ORAL_TABLET | ORAL | Status: DC | PRN
  Administered 2011-10-13 – 2011-10-15 (×6): 0.2 mg via ORAL
  Filled 2011-10-13 (×5): qty 1

## 2011-10-13 NOTE — Progress Notes (Signed)
Patients blood pressure improved to 136/64.  Patients temp 101.4.  Patient with no recorded prior temp greater than 99.2.  Notified M. Lynch NP.  Orders given.  Notified patient and family of the above.  Patient verbalizes understanding.  Lab notified and awaiting radiology for portable chest x-ray.

## 2011-10-13 NOTE — Progress Notes (Signed)
Subjective: Patient admitted with abdominal pain with CT evidence of possible SBO. She does have a history of multiple surgeries. She also had out of control hypertension.  Objective: Lab: K 3.5, normal renal function; CK nl, Trop I <0.30; WBC 7.4, Hgb 9.9 Imaging - KUB non-obstructive bowel gas pattern                 CT - s/p gastric by-pass. Question of dilated loops of bowel - enteritis vs emerging obstruction   Physical Exam: Vitals reviewed - BP very high Gen'l - obese AA woman in no distress Pulm - normal respirations, no increased WOB Cor - 2+ radial, RRR, no murmur Abd- s/p surgery with distorted abdominal wall. BS+ x 4 quadrants, no guarding or rebound, no tenderness to palpation Ext - no edema Neuro - A&O x 3     Assessment/Plan: 1.ABdominal pain- clinical no evidence of obstruction. Favor gastroenteritis.  Plan - advance diet to clear liquid and to full liquids in AM  2. HTN - pt reports she had good control August. Hoe meds = cozaar 100mg  qd, doxazosin 4 mg.  Plan - resume home meds.           Clonidine 0.2 mg q 4 for SBP >160  3. DM -  metformin, actos and januvia as outpatient.. While in [place] will follow sliding scale until taking diet.   Adella Hare 10/13/2011, 3:44 PM

## 2011-10-13 NOTE — Progress Notes (Signed)
Pt given clonidine as ordered.  Pt instructed to call if headache or blurred vision. Call light within reach.  Family at bedside.  Pt denies complaints at this time.

## 2011-10-13 NOTE — Progress Notes (Signed)
Patients bp 188/92 manual.  Read Dr Crissie Sickles order concerning BP control.  Clonidine not ordered at this time.  Notified Dr. Crissie Sickles of the above blood pressure, and ? Clonidine.  Order given to start Clonidine for SBP >160.  Ordered. Awaiting medicine.  Patient denies headache, blurred vision.  Family at bedside.

## 2011-10-14 ENCOUNTER — Inpatient Hospital Stay (HOSPITAL_COMMUNITY): Payer: BC Managed Care – PPO

## 2011-10-14 LAB — GLUCOSE, CAPILLARY: Glucose-Capillary: 124 mg/dL — ABNORMAL HIGH (ref 70–99)

## 2011-10-14 NOTE — Progress Notes (Signed)
Subjective: Feels better but still w/ some abdominal pain. Has been able to tolerated full liquid diet. Has not had BM or flatus  Objective: No new lab. No new imaging   Physical Exam: Vitals reviewed - stble Cor- RRR Abd - obese, soft, tender at the upper quadrants, hypoactive BS     Assessment/Plan: 1. Abdominal pain - better but very quiet.  Plan - hold at full liquid diet           2 view abdoen - f/u enteritis vs SBO  2. HTN - doing better. Has required clonidine  Plan - continue present regimen  3. DM - continue sliding scale.    Adella Hare 10/14/2011, 9:46 AM

## 2011-10-15 LAB — GLUCOSE, CAPILLARY: Glucose-Capillary: 187 mg/dL — ABNORMAL HIGH (ref 70–99)

## 2011-10-15 MED ORDER — PRAVASTATIN SODIUM 20 MG PO TABS: 20.0000 mg | ORAL_TABLET | Freq: Every day | ORAL | Status: DC

## 2011-10-15 MED ORDER — SPIRONOLACTONE 25 MG PO TABS: 25.0000 mg | ORAL_TABLET | Freq: Every day | ORAL | Status: DC

## 2011-10-15 MED ORDER — HYDROMORPHONE HCL PF 2 MG/ML IJ SOLN
INTRAMUSCULAR | Status: AC
  Administered 2011-10-15: 1 mg
  Filled 2011-10-15: qty 1

## 2011-10-15 MED ORDER — HYDROCODONE-ACETAMINOPHEN 5-325 MG PO TABS
1.0000 | ORAL_TABLET | Freq: Four times a day (QID) | ORAL | Status: DC | PRN
  Administered 2011-10-15: 1 via ORAL
  Filled 2011-10-15: qty 1

## 2011-10-15 NOTE — Discharge Summary (Addendum)
  Physician Discharge Summary  Patient ID: Alicia [name] MRN: [id number] DOB/AGE: 08/18/1946 65 y.o.  Admit date: 10/12/2011 Discharge date: 10/15/2011  Admission Diagnoses: fever and abdominal pain  Discharge Diagnoses:  Active Problems: Small bowel obstruction Hypokalemia Fever/abdominal pain  Hypertension  Dyslipidemia  Diabetes mellitus  Gastric bypass status for obesity   Disposition: Home or Self Care   Consults: none  Significant Diagnostic Studies: labs: Blood chemistries, LFTs, lipase, CBC normal. , microbiology: blood culture: negative and urine culture: negative and radiology: CXR: normal, KUB: Normal   and CT scan: Possible early small bowel obstruction otherwise unremarkable.  Treatments: IV fluids and antibiotics Hospital Course: 65 years old female with history of type 2 diabetes hypertension, history of previous gastric bypass surgery, GERD dyslipidemia. Present with fever abdominal pain. KUB and blood work was unremarkable. She had CT scan of the abdomen pelvic. CT scan of the abdomen pelvic showed possible mild early small bowel obstruction without any mechanical findings. Patient did not had any vomiting. She was put on IV fluids and clear liquid diet. Also started on empiric antibiotics for possible enteritis. Her white count went stable. Her diet was advanced. She thought it that well. Subsequent KUB was unremarkable. She did complain of some loose stools. Abdomen pain improved. She remained afebrile. Hypokalemia resolved Hypertension: Blood pressure is elevated. Patient was not taking her Aldactone at home. We'll restart that with a prescription given. She'll continue on her previous blood pressure medication. Her cardiac markers are negative. Diabetes: A1c was in normal range. Continue on Actos plus met and Januvia GERD: Nexium Bland diet Discharged Condition: Improved  Discharge Orders    Future Orders Please Complete By Expires   Diet - low sodium  heart healthy      Increase activity slowly      Discharge instructions      Comments:   Bland diet for 48 hrs then advance to low carb diet as tolerated     Current Discharge Medication List    START taking these medications   Details  pravastatin (PRAVACHOL) 20 MG tablet Take 1 tablet (20 mg total) by mouth daily. Qty: 30 tablet, Refills: 6    spironolactone (ALDACTONE) 25 MG tablet Take 1 tablet (25 mg total) by mouth daily. Qty: 30 tablet, Refills: 5      CONTINUE these medications which have NOT CHANGED   Details  doxazosin (CARDURA) 4 MG tablet Take 4 mg by mouth at bedtime.      esomeprazole (NEXIUM) 40 MG capsule Take 40 mg by mouth daily before breakfast.      losartan (COZAAR) 100 MG tablet Take 100 mg by mouth daily.      pioglitazone-metformin (ACTOPLUS MET) 15-850 MG per tablet Take 1 tablet by mouth 2 (two) times daily with a meal.      sitaGLIPtin (JANUVIA) 100 MG tablet Take 100 mg by mouth daily.         Follow-up Information    Follow up with Kynadee Dam in 1 week. (call office for appt)    Contact information:   [address] [place]          Signed: Wenda Low 10/15/2011, 7:51 AM

## 2011-10-16 LAB — URINE CULTURE
Colony Count: >=100000
Culture  Setup Time: 201211251107

## 2011-10-20 LAB — CULTURE, BLOOD (ROUTINE X 2)
Culture  Setup Time: 201211251108
Culture: NO GROWTH
Culture: NO GROWTH

## 2011-10-29 ENCOUNTER — Other Ambulatory Visit: Payer: Self-pay | Admitting: Gastroenterology

## 2011-11-20 LAB — HM MAMMOGRAPHY

## 2011-12-18 ENCOUNTER — Other Ambulatory Visit: Payer: Self-pay | Admitting: Dermatology

## 2012-01-22 ENCOUNTER — Other Ambulatory Visit: Payer: Self-pay | Admitting: Internal Medicine

## 2012-01-22 DIAGNOSIS — Z1231 Encounter for screening mammogram for malignant neoplasm of breast: Secondary | ICD-10-CM

## 2012-02-08 ENCOUNTER — Ambulatory Visit
Admission: RE | Admit: 2012-02-08 | Discharge: 2012-02-08 | Disposition: A | Discharge status: Home / Self Care | Payer: BC Managed Care – PPO | Source: Ambulatory Visit | Attending: Internal Medicine | Admitting: Internal Medicine

## 2012-02-08 DIAGNOSIS — Z1231 Encounter for screening mammogram for malignant neoplasm of breast: Secondary | ICD-10-CM

## 2012-09-01 ENCOUNTER — Encounter: Payer: Self-pay | Admitting: Internal Medicine

## 2012-09-02 ENCOUNTER — Encounter (HOSPITAL_COMMUNITY): Payer: Self-pay | Admitting: *Deleted

## 2012-09-02 ENCOUNTER — Emergency Department (HOSPITAL_COMMUNITY)
Admission: EM | Admit: 2012-09-02 | Discharge: 2012-09-02 | Disposition: A | Discharge status: Home / Self Care | Payer: BC Managed Care – PPO | Source: Home / Self Care | Attending: Emergency Medicine | Admitting: Emergency Medicine

## 2012-09-02 ENCOUNTER — Emergency Department (INDEPENDENT_AMBULATORY_CARE_PROVIDER_SITE_OTHER): Payer: BC Managed Care – PPO

## 2012-09-02 DIAGNOSIS — R0789 Other chest pain: Secondary | ICD-10-CM

## 2012-09-02 LAB — POCT I-STAT, CHEM 8
Chloride: 107 mEq/L (ref 96–112)
Creatinine, Ser: 1.1 mg/dL (ref 0.50–1.10)
Glucose, Bld: 117 mg/dL — ABNORMAL HIGH (ref 70–99)
Hemoglobin: 12.6 g/dL (ref 12.0–15.0)
Potassium: 4.3 mEq/L (ref 3.5–5.1)
Sodium: 144 mEq/L (ref 135–145)
TCO2: 26 mmol/L (ref 0–100)

## 2012-09-02 MED ORDER — TRAMADOL HCL 50 MG PO TABS: 50.0000 mg | ORAL_TABLET | Freq: Four times a day (QID) | ORAL | Status: DC | PRN

## 2012-09-02 MED ORDER — PREDNISONE 50 MG PO TABS: ORAL_TABLET | ORAL | Status: DC

## 2012-09-02 NOTE — ED Provider Notes (Signed)
History     CSN: [id number]  Arrival date & time 09/02/12  65   First MD Initiated Contact with Patient 09/02/12 1420      Chief Complaint  Patient presents with  . Abdominal Pain    (Consider location/radiation/quality/duration/timing/severity/associated sxs/prior treatment) HPI 2 weeks of right upper abd pain that has been gradually worsening.  No obvious inciting factor.  Patient has been under a large amount of stress recently and has been ignoring it hoping it would go away.  It has been gradually worsening.  Pain is constant and sharp in quality.  Made worse by movement, relieved by nothing.  Pain is also felt in her right shoulder and some in her right arm.  It is also made worse by eating.  Pt has history of gastric stapling in 1980s, prior cholecystectomy and appendectomy.  She admits to some nausea and intermittent vomiting for past two weeks but denies any fevers, chills, shortness of breath, chest pain, weight changes, rashes, or lack of appetite.  Past Medical History  Diagnosis Date  . Diabetes mellitus   . Hypertension     Past Surgical History  Procedure Date  . Cholecystectomy     No family history on file.  History  Substance Use Topics  . Smoking status: Never Smoker   . Smokeless tobacco: Not on file  . Alcohol Use: No    OB History    Grav Para Term Preterm Abortions TAB SAB Ect Mult Living                  Review of Systems  Allergies  Iodinated diagnostic agents and Sulfa antibiotics  Home Medications   Current Outpatient Rx  Name Route Sig Dispense Refill  . DOXAZOSIN MESYLATE 4 MG PO TABS Oral Take 4 mg by mouth at bedtime.      [name] ESOMEPRAZOLE MAGNESIUM 40 MG PO CPDR Oral Take 40 mg by mouth daily before breakfast.      . LOSARTAN POTASSIUM 100 MG PO TABS Oral Take 100 mg by mouth daily.      [name] PIOGLITAZONE HCL-METFORMIN HCL 15-850 MG PO TABS Oral Take 1 tablet by mouth 2 (two) times daily with a meal.      . PRAVASTATIN SODIUM 20  MG PO TABS Oral Take 1 tablet (20 mg total) by mouth daily. 30 tablet 6  . SITAGLIPTIN PHOSPHATE 100 MG PO TABS Oral Take 100 mg by mouth daily.      [name] SPIRONOLACTONE 25 MG PO TABS Oral Take 1 tablet (25 mg total) by mouth daily. 30 tablet 5    BP 216/127  Pulse 84  Temp 98.5 F (36.9 C) (Oral)  Resp 16  SpO2 100% Manual recheck of bp gives 178/86  Physical Exam  Constitutional: She is oriented to person, place, and time. She appears well-developed and well-nourished. No distress.  HENT:  Head: Normocephalic and atraumatic.  Right Ear: External ear normal.  Left Ear: External ear normal.  Eyes: Conjunctivae normal and EOM are normal. Pupils are equal, round, and reactive to light.  Neck: Normal range of motion.  Cardiovascular: Normal rate, regular rhythm and normal heart sounds.   Pulmonary/Chest: Effort normal and breath sounds normal. She exhibits tenderness.       Chest wall tenderness along sternum and right lower rib.  Reproduces her pain.  Abdominal: Soft. She exhibits no distension and no mass. There is no tenderness. There is no rebound and no guarding.  Neurological: She is alert and  oriented to person, place, and time. No cranial nerve deficit.    ED Course  Procedures (including critical care time)  Labs Reviewed  POCT I-STAT, CHEM 8 - Abnormal; Notable for the following:    Glucose, Bld 117 (*)     All other components within normal limits   Dg Abd Acute W/chest  09/02/2012  *RADIOLOGY REPORT*  Clinical Data: 66 year old female with right-sided abdominal and pelvic pain and diarrhea.  ACUTE ABDOMEN SERIES (ABDOMEN 2 VIEW & CHEST 1 VIEW)  Comparison: 10/14/2011  Findings: Upper limits normal heart size noted. There is no evidence of airspace disease, pleural effusion, pulmonary nodule/mass or pneumothorax.  The bowel gas pattern is unremarkable. There is no evidence of bowel obstruction or pneumoperitoneum. Surgical clips in the upper abdomen are again noted. No  acute bony abnormalities are identified.  IMPRESSION: No evidence of acute abnormality. Unremarkable bowel gas pattern.  Upper limits normal heart size.   Original Report Authenticated By: Lura Em, M.D.      1. Chest pain, musculoskeletal       MDM  MSK chest pain.  Unable to use NSAIDs due to bp.  Will treat with tramadol and prednisone.        Vivi Ferns, MD 09/02/12 [phone]

## 2012-09-02 NOTE — ED Provider Notes (Signed)
Medical screening examination/treatment/procedure(s) were performed by a resident physician and as supervising physician I was immediately available for consultation/collaboration.  Additionally, I saw the patient independently, verified the history, examined the patient and discussed the treatment plan with the resident. On examination, the patient is very tender over the rib cage on the right and the sternum suggesting musculoskeletal pain.  Philipp Deputy, M.D.    Harden Mo, MD 09/02/12 [phone]

## 2012-09-02 NOTE — ED Notes (Signed)
Pt  Reports   r upper  Quadrant  Pain   As  Well  As   Pain  In  Upper  Back /  Neck  Radiating  Down r  Arm   X  sev  Weeks     Worse   Over  Last  Couple  Of  Days   She reports  Some  Nausea  No  Vomiting  /      Pt  However  Did  Report   Some  Diarrhea           She  Ambulated  To  Room  With a  Slow  Steady  Gait    She  Is  Sitting  Upright on  Exam table  Speaking in  Complete  sentances

## 2012-09-22 ENCOUNTER — Encounter: Payer: Self-pay | Admitting: Internal Medicine

## 2012-09-23 ENCOUNTER — Other Ambulatory Visit (INDEPENDENT_AMBULATORY_CARE_PROVIDER_SITE_OTHER): Payer: Medicare Other

## 2012-09-23 ENCOUNTER — Encounter: Payer: Self-pay | Admitting: Internal Medicine

## 2012-09-23 ENCOUNTER — Ambulatory Visit (INDEPENDENT_AMBULATORY_CARE_PROVIDER_SITE_OTHER): Payer: Medicare Other | Admitting: Internal Medicine

## 2012-09-23 VITALS — BP 158/82 | HR 51 | Ht 66.0 in | Wt 286.0 lb

## 2012-09-23 DIAGNOSIS — Z860100 Personal history of colon polyps, unspecified: Secondary | ICD-10-CM

## 2012-09-23 DIAGNOSIS — K921 Melena: Secondary | ICD-10-CM | POA: Diagnosis not present

## 2012-09-23 DIAGNOSIS — K219 Gastro-esophageal reflux disease without esophagitis: Secondary | ICD-10-CM

## 2012-09-23 DIAGNOSIS — D126 Benign neoplasm of colon, unspecified: Secondary | ICD-10-CM

## 2012-09-23 DIAGNOSIS — E119 Type 2 diabetes mellitus without complications: Secondary | ICD-10-CM

## 2012-09-23 DIAGNOSIS — R1013 Epigastric pain: Secondary | ICD-10-CM

## 2012-09-23 DIAGNOSIS — I10 Essential (primary) hypertension: Secondary | ICD-10-CM

## 2012-09-23 DIAGNOSIS — E785 Hyperlipidemia, unspecified: Secondary | ICD-10-CM

## 2012-09-23 LAB — COMPREHENSIVE METABOLIC PANEL
AST: 18 U/L (ref 0–37)
Albumin: 3.7 g/dL (ref 3.5–5.2)
Alkaline Phosphatase: 74 U/L (ref 39–117)
BUN: 21 mg/dL (ref 6–23)
Calcium: 9.2 mg/dL (ref 8.4–10.5)
Chloride: 103 mEq/L (ref 96–112)
Glucose, Bld: 149 mg/dL — ABNORMAL HIGH (ref 70–99)
Potassium: 4.7 mEq/L (ref 3.5–5.1)
Sodium: 141 mEq/L (ref 135–145)
Total Protein: 6.9 g/dL (ref 6.0–8.3)

## 2012-09-23 LAB — CBC
HCT: 35.1 % — ABNORMAL LOW (ref 36.0–46.0)
MCV: 70.2 fl — ABNORMAL LOW (ref 78.0–100.0)
RBC: 5 Mil/uL (ref 3.87–5.11)
RDW: 16.4 % — ABNORMAL HIGH (ref 11.5–14.6)
WBC: 6.6 10*3/uL (ref 4.5–10.5)

## 2012-09-23 MED ORDER — PIOGLITAZONE HCL-METFORMIN HCL 15-850 MG PO TABS: 1.0000 | ORAL_TABLET | Freq: Two times a day (BID) | ORAL | Status: DC

## 2012-09-23 MED ORDER — PRAVASTATIN SODIUM 20 MG PO TABS: 20.0000 mg | ORAL_TABLET | Freq: Every day | ORAL | Status: DC

## 2012-09-23 MED ORDER — SPIRONOLACTONE 25 MG PO TABS: 25.0000 mg | ORAL_TABLET | Freq: Every day | ORAL | Status: DC

## 2012-09-23 MED ORDER — LOSARTAN POTASSIUM 100 MG PO TABS: 100.0000 mg | ORAL_TABLET | Freq: Every day | ORAL | Status: DC

## 2012-09-23 MED ORDER — ESOMEPRAZOLE MAGNESIUM 40 MG PO CPDR: 40.0000 mg | DELAYED_RELEASE_CAPSULE | Freq: Two times a day (BID) | ORAL | Status: DC

## 2012-09-23 NOTE — Progress Notes (Addendum)
Patient ID: Alicia [name], female   DOB: [birth year], 66 y.o.   MRN: [id number]  SUBJECTIVE: HPI Alicia [name] is a 66 year old female with a past medical history of diabetes, hypertension, hyperlipidemia, obesity status post bariatric surgery (unclear if this is Roux-en-Y), and colon polyps who is seen in consultation at the request of Dr. Lysle Rubens for evaluation of right upper quadrant abdominal pain. The patient states that over the past several weeks she's had a constant moderate to severe right upper quadrant discomfort. This tends to be worse as the day progresses. He is also worse after a full meal. 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 It also tends to be worse with deep inspiration. She reports occasional nausea without vomiting. Her heartburn has been well controlled on Nexium. She has noticed some looser stools and at times they have been black and tarry. She also sees some rare bright red blood per rectum, which was evaluated with colonoscopy in the past year at [facility]. She recalls some benign polyps being removed and told she needed a repeat in 5 years. She denies fevers or chills.  She reports being seen in urgent care for this pain and was treated with a steroid burst for "inflamed pectoral muscle".  This did not help. She was given some tramadol which does help and she is taking this each bedtime.  She has a history of "stomach stapling" in 1984, gallbladder removal early 1980s for stones, lysis of adhesions and appendectomy  On review of the EMR only, it appears she was hospitalized for short time in November 2012 with abdominal pain. CT scan revealed possible enteritis without a distinct transition point in the anterior abdomen. She was treated with IV antibiotics, IV fluids and her issues resolved and she was discharged.  She also is under the impression that I was her primary  care provider and she is requesting refills of her blood pressure, diabetes and cholesterol medications  Review of Systems  As per history of present illness, otherwise negative   Past Medical History  Diagnosis Date  . Diabetes mellitus   . Hypertension   . Hyperlipidemia   . Obesity     Current Outpatient Prescriptions  Medication Sig Dispense Refill  . doxazosin (CARDURA) 4 MG tablet Take 4 mg by mouth at bedtime.        [name] esomeprazole (NEXIUM) 40 MG capsule Take 1 capsule (40 mg total) by mouth 2 (two) times daily.  60 capsule  3  . losartan (COZAAR) 100 MG tablet Take 1 tablet (100 mg total) by mouth daily.  30 tablet  0  . pioglitazone-metformin (ACTOPLUS MET) 15-850 MG per tablet Take 1 tablet by mouth 2 (two) times daily with a meal.  30 tablet  0  . pravastatin (PRAVACHOL) 20 MG tablet Take 1 tablet (20 mg total) by mouth daily.  30 tablet  1  . predniSONE (DELTASONE) 50 MG tablet Take one daily for 5 days  5 tablet  0  . sitaGLIPtin (JANUVIA) 100 MG tablet Take 100 mg by mouth daily.        [name] spironolactone (ALDACTONE) 25 MG tablet Take 1 tablet (25 mg total) by mouth daily.  30 tablet  1  . traMADol (ULTRAM) 50 MG tablet Take 1-2 tablets (50-100 mg total) by mouth every 6 (six) hours as needed for pain.  45 tablet  0  . [DISCONTINUED] esomeprazole (NEXIUM) 40 MG capsule Take 40 mg by mouth daily before breakfast.        . [DISCONTINUED] losartan (COZAAR) 100 MG tablet Take 100 mg by mouth daily.        . [DISCONTINUED] pioglitazone-metformin (ACTOPLUS MET) 15-850 MG per tablet Take 1 tablet by mouth 2 (two) times daily with a meal.        . [DISCONTINUED] pravastatin (PRAVACHOL) 20 MG tablet Take 1 tablet (20 mg total) by mouth daily.  30 tablet  6  . [DISCONTINUED] spironolactone (ALDACTONE) 25 MG tablet Take 1 tablet (25 mg total) by mouth daily.  30 tablet  5    Allergies  Allergen Reactions  . Iodinated Diagnostic Agents   . Sulfa Antibiotics     Family History    Problem Relation Age of Onset  . Stomach cancer Sister   . Diabetes Mother   . Diabetes Sister     History  Substance Use Topics  . Smoking status: Never Smoker   . Smokeless tobacco: Never Used  . Alcohol Use: No    OBJECTIVE: BP 158/82  Pulse 51  Ht 5\' 6"  (1.676 m)  Wt 286 lb (129.729 kg)  BMI 46.16 kg/m2  SpO2 98% Constitutional: Well-developed and well-nourished. No distress. HEENT: Normocephalic and atraumatic. Oropharynx is clear and moist. No oropharyngeal exudate. Conjunctivae are normal. No scleral icterus. Dentures noted Neck: Neck supple. Trachea midline. Cardiovascular: Bradycardic with regular rhythm and intact distal pulses.  Pulmonary/chest: Effort normal and breath sounds normal. No wheezing, rales or rhonchi. Abdominal: Soft, mild to moderate right upper quadrant tenderness with deep palpation and also with palpation over the anterior rib cage on the right, nondistended. Well-healed abdominal incision. Bowel sounds active throughout. Exam somewhat limited by body habitus Extremities: no clubbing, cyanosis, or edema Neurological: Alert and oriented to person place and time. Skin: Skin is warm and dry. No rashes noted. Psychiatric: Normal mood and affect. Behavior is normal.  Labs and Imaging -- CMP     Component Value Date/Time   NA 144 09/02/2012 1501   K 4.3 09/02/2012 1501   CL 107 09/02/2012 1501   CO2 26 10/13/2011 0510   GLUCOSE 117* 09/02/2012 1501   BUN 23 09/02/2012 1501   CREATININE 1.10 09/02/2012 1501   CALCIUM 8.3* 10/13/2011 0510   PROT 5.9* 10/13/2011 0510   ALBUMIN 3.1* 10/13/2011 0510   AST 15 10/13/2011 0510   ALT 10 10/13/2011 0510   ALKPHOS 77 10/13/2011 0510   BILITOT 0.3 10/13/2011 0510   GFRNONAA 55* 10/13/2011 0510   GFRAA 64* 10/13/2011 0510   CT scan of the abdomen and pelvis performed November 2012 for pelvic pain Clinical Data: 66 year old female with abdominal and pelvic pain.   CT ABDOMEN AND PELVIS WITHOUT  CONTRAST   Technique:  Multidetector CT imaging of the abdomen and pelvis was performed following the standard protocol without intravenous contrast.   Comparison: 05/26/2007 abdominal ultrasound   Findings: A 2.5 x 3.3 cm hypodense lesion within the right liver (image 19) has a similar appearance to prior ultrasound probably represents a hemangioma. The liver is otherwise unremarkable.   The spleen, pancreas, adrenal glands and kidneys are unremarkable. It is status post cholecystectomy. Please note that parenchymal abnormalities may be missed as intravenous contrast was not administered.   A very small amount of ascites is identified in the perihepatic region and anterior right abdomen.   Evidence of gastric bypass is noted.  There are mildly distended loops of small bowel within the anterior abdomen with a small amount of inflammation and fluid in the anterior abdomen. No discrete transition point or evidence of pneumoperitoneum noted. No enlarged lymph nodes, biliary dilatation or abdominal aortic aneurysm noted. No acute or suspicious bony abnormalities are present.   IMPRESSION: Mildly distended anterior abdominal small bowel loops with adjacent inflammation and small amount of free fluid.  Although a discrete transition point is not identified, developing obstruction is not excluded given this patient's history of gastric bypass.  This also may represent an enteritis.  No evidence of pneumoperitoneum.   Apparently stable right hepatic mass - probably representing an hemangioma.   ASSESSMENT AND PLAN: 66 year old female with a past medical history of diabetes, hypertension, hyperlipidemia, obesity status post bariatric surgery (unclear if this is Roux-en-Y), and colon polyps who is seen in consultation at the request of Dr. Lysle Rubens for evaluation of right upper quadrant abdominal pain.  1.  RUQ pain -- at this point I'm not convinced that her right upper quadrant pain  which is constant in nature and somewhat pleuritic is definitely gastrointestinal. I am concerned by her report of black and tarry stools and therefore I recommended upper endoscopy for direct visualization. I've explained, given that she has had previous bariatric surgery, the part of her stomach may be excluded and therefore difficult to examine endoscopically. Nonetheless, we will proceed to EGD to evaluate her gastric pouch and small bowel. I will check an H. pylori stool antigen, given that the breath test may be inaccurate after bypass surgery.   She is status post cholecystectomy and therefore a biliary origin of her pain is felt less likely. I will check a comprehensive metabolic panel today and CBC. Finally, her pain may be musculoskeletal but she did not respond to a steroid burst. I've advised that she try to avoid heavy lifting for the next several weeks. In the interim she will continue tramadol for pain and I have asked that she increase her Nexium to 40 mg twice a day before meals until after the EGD.  2.  Hypertension/diabetes/hypercholesterolermia -- I will give her 30 day refills of her losartan, pioglitazone-metformin, pravastatin and spironolactone and refer her to primary care to establish care. She is aware that these prescriptions need to originate from primary care in the future.  3.  History of colon polyps -- we will obtain her prior colonoscopy records for completeness she will continue colonoscopy surveillance interval her that recommendation.   Addendum: Colonoscopy - 10/29/11 -- Dr Earle Gell -- to the cecum with good prep.  3 mm sigmoid colon adenoma, rec repeat Dec 2017

## 2012-09-23 NOTE — Patient Instructions (Addendum)
You have been scheduled for an endoscopy with propofol. Please follow written instructions given to you at your visit today. If you use inhalers (even only as needed) or a CPAP machine, please bring them with you on the day of your procedure.  We have sent the following medications to your pharmacy for you to pick up at your convenience: Losartan, Nexium, Provastatin, aldactone  Your physician has requested that you go to the basement for lab work before leaving today.  You have been scheduled with Dr. Asa Lente at [facility] on [address] at 9:30 am on 04/14/2013 Please arrive 15 minutes prior to your appointment.

## 2012-09-24 DIAGNOSIS — D126 Benign neoplasm of colon, unspecified: Secondary | ICD-10-CM | POA: Insufficient documentation

## 2012-09-24 LAB — HELICOBACTER PYLORI  SPECIAL ANTIGEN

## 2012-09-25 ENCOUNTER — Telehealth: Payer: Self-pay | Admitting: Internal Medicine

## 2012-09-25 NOTE — Telephone Encounter (Signed)
Forward  9 pages from [facility] Internal Medicine @ [facility] to Dr. Zenovia Jarred for review on 09-25-12 ym

## 2012-09-26 ENCOUNTER — Other Ambulatory Visit: Payer: Self-pay | Admitting: Internal Medicine

## 2012-09-26 ENCOUNTER — Other Ambulatory Visit (INDEPENDENT_AMBULATORY_CARE_PROVIDER_SITE_OTHER): Payer: Medicare Other

## 2012-09-26 DIAGNOSIS — D649 Anemia, unspecified: Secondary | ICD-10-CM | POA: Diagnosis not present

## 2012-09-26 DIAGNOSIS — R6889 Other general symptoms and signs: Secondary | ICD-10-CM

## 2012-09-26 LAB — FOLATE: Folate: 14.7 ng/mL (ref >5.9)

## 2012-09-26 LAB — VITAMIN B12: Vitamin B-12: 298 pg/mL (ref 211–911)

## 2012-09-26 LAB — IBC PANEL
Iron: 33 ug/dL — ABNORMAL LOW (ref 42–145)
Transferrin: 239.1 mg/dL (ref 212.0–360.0)

## 2012-09-29 ENCOUNTER — Encounter: Payer: Self-pay | Admitting: Internal Medicine

## 2012-09-29 ENCOUNTER — Ambulatory Visit (AMBULATORY_SURGERY_CENTER): Payer: Medicare Other | Admitting: Internal Medicine

## 2012-09-29 VITALS — BP 208/106 | HR 72 | Temp 96.9°F | Resp 11 | Ht 66.0 in | Wt 286.0 lb

## 2012-09-29 DIAGNOSIS — K921 Melena: Secondary | ICD-10-CM | POA: Diagnosis not present

## 2012-09-29 DIAGNOSIS — Z6841 Body Mass Index (BMI) 40.0 and over, adult: Secondary | ICD-10-CM | POA: Diagnosis not present

## 2012-09-29 DIAGNOSIS — R1013 Epigastric pain: Secondary | ICD-10-CM

## 2012-09-29 DIAGNOSIS — Z9884 Bariatric surgery status: Secondary | ICD-10-CM

## 2012-09-29 DIAGNOSIS — R1011 Right upper quadrant pain: Secondary | ICD-10-CM | POA: Diagnosis not present

## 2012-09-29 DIAGNOSIS — K219 Gastro-esophageal reflux disease without esophagitis: Secondary | ICD-10-CM

## 2012-09-29 DIAGNOSIS — I10 Essential (primary) hypertension: Secondary | ICD-10-CM | POA: Diagnosis not present

## 2012-09-29 LAB — GLUCOSE, CAPILLARY
Glucose-Capillary: 78 mg/dL (ref 70–99)
Glucose-Capillary: 130 mg/dL — ABNORMAL HIGH (ref 70–99)

## 2012-09-29 MED ORDER — DEXTROSE 5 % IV SOLN: INTRAVENOUS | Status: DC

## 2012-09-29 MED ORDER — DOXAZOSIN MESYLATE 4 MG PO TABS: 4.0000 mg | ORAL_TABLET | Freq: Every day | ORAL | Status: DC

## 2012-09-29 MED ORDER — SODIUM CHLORIDE 0.9 % IV SOLN: 500.0000 mL | INTRAVENOUS | Status: DC

## 2012-09-29 NOTE — Op Note (Signed)
[place]  [address] [place], [id number]   ENDOSCOPY PROCEDURE REPORT  PATIENT: Alicia, [name]  MR#: [id number] BIRTHDATE: [birth year] , 21  yrs. old GENDER: Female ENDOSCOPIST: [name], MD REFERRED BY:  Wenda Low PROCEDURE DATE:  09/29/2012 PROCEDURE:  EGD, diagnostic ASA CLASS:     Class III INDICATIONS:  abdominal pain in the upper right quadrant, possible melena MEDICATIONS: MAC sedation, administered by CRNA and Propofol (Diprivan) 220 mg IV TOPICAL ANESTHETIC: Cetacaine Spray  DESCRIPTION OF PROCEDURE: After the risks benefits and alternatives of the procedure were thoroughly explained, informed consent was obtained.  The LB [id number]-H180 [id number] endoscope was introduced through the mouth and advanced to the second portion of the duodenum. Without limitations.  The instrument was slowly withdrawn as the mucosa was fully examined.     ESOPHAGUS: A variable Z-line was observed 38 cm from the incisors. The mucosa of the esophagus appeared normal.  STOMACH: A fundoplication was found in the cardia consistent with prior bariatric surgery.  Normal gastric pouch with incidental pill noted.  Pouch characterized as healthy in appearance.   The remainder of the stomach beyond this pouch appeared normal.  DUODENUM: A small angiodysplastic lesion with no bleeding found in the 2nd part of the duodenum.   Otherwise normal duodenum to the second portion.  Retroflexed views revealed no abnormalities. The scope was then withdrawn from the patient and the procedure completed.  COMPLICATIONS: There were no complications.  ENDOSCOPIC IMPRESSION: 1.   Variable Z-line was observed 38 cm from the incisors 2.   The mucosa of the esophagus appeared normal 3.   Prior bariatric fundoplication was found in the cardia 4.   The mucosa of the stomach appeared normal 5.   Small angiodysplastic lesion with no bleeding found in the 2nd part of the duodenum 6.    Otherwise normal duodenum to the second portion     RECOMMENDATIONS: 1.  Await labs drawn today 2.  Continue current medications  eSigned:  [name], MD 09/29/2012 3:32 PM   [id number], [place] Ar MD and The Patient  PATIENT NAME:  Alicia, [name] MR#: [id number]

## 2012-09-29 NOTE — Patient Instructions (Addendum)
YOU HAD AN ENDOSCOPIC PROCEDURE TODAY AT THE [facility]: Refer to the procedure report that was given to you for any specific questions about what was found during the examination.  If the procedure report does not answer your questions, please call your gastroenterologist to clarify.  If you requested that your care partner not be given the details of your procedure findings, then the procedure report has been included in a sealed envelope for you to review at your convenience later.  YOU SHOULD EXPECT: Some feelings of bloating in the abdomen. Passage of more gas than usual.  Walking can help get rid of the air that was put into your GI tract during the procedure and reduce the bloating. If you had a lower endoscopy (such as a colonoscopy or flexible sigmoidoscopy) you may notice spotting of blood in your stool or on the toilet paper. If you underwent a bowel prep for your procedure, then you may not have a normal bowel movement for a few days.  DIET: Your first meal following the procedure should be a light meal and then it is ok to progress to your normal diet.  A half-sandwich or bowl of soup is an example of a good first meal.  Heavy or fried foods are harder to digest and may make you feel nauseous or bloated.  Likewise meals heavy in dairy and vegetables can cause extra gas to form and this can also increase the bloating.  Drink plenty of fluids but you should avoid alcoholic beverages for 24 hours.  ACTIVITY: Your care partner should take you home directly after the procedure.  You should plan to take it easy, moving slowly for the rest of the day.  You can resume normal activity the day after the procedure however you should NOT DRIVE or use heavy machinery for 24 hours (because of the sedation medicines used during the test).    SYMPTOMS TO REPORT IMMEDIATELY: A gastroenterologist can be reached at any hour.  During normal business hours, 8:30 AM to 5:00 PM Monday through Friday,  call [phone].  After hours and on weekends, please call the GI answering service at [phone] who will take a message and have the physician on call contact you.   Following lower endoscopy (colonoscopy or flexible sigmoidoscopy):  Excessive amounts of blood in the stool  Significant tenderness or worsening of abdominal pains  Swelling of the abdomen that is new, acute  Fever of 100F or higher  FOLLOW UP: If any biopsies were taken you will be contacted by phone or by letter within the next 1-3 weeks.  Call your gastroenterologist if you have not heard about the biopsies in 3 weeks.  Our staff will call the home number listed on your records the next business day following your procedure to check on you and address any questions or concerns that you may have at that time regarding the information given to you following your procedure. This is a courtesy call and so if there is no answer at the home number and we have not heard from you through the emergency physician on call, we will assume that you have returned to your regular daily activities without incident.  SIGNATURES/CONFIDENTIALITY: You and/or your care partner have signed paperwork which will be entered into your electronic medical record.  These signatures attest to the fact that that the information above on your After Visit Summary has been reviewed and is understood.  Full responsibility of the confidentiality of this   discharge information lies with you and/or your care-partner.   Thank-you for choosing us for your healthcare needs. 

## 2012-09-29 NOTE — Progress Notes (Addendum)
Patient states that she did take one of her bp meds this am, but the pharmacy did not give her the cardura.   Patient's BP is elevated 201/105, and I have changed cuffs and repositioned with no good results.    Dr. Hilarie Fredrickson has been notifed.    Dr. Hilarie Fredrickson stated that the patient needs to see her PCP as soon as possible to check her blood pressure.  The 3rd floor is working to get pt in with another PCP.  THIS IS AN ERROR. WRONG PATIENT.  This is the correct patient.    I tried to get an appointment with Dr. Burgess Estelle, and their office stated that they would not make an appointment to see her due to an outstanding bill.   I told the patient to go either to urgent care or to the emergency room to be checked out.  She has had bad headaches and blackouts in the past.   The patient agreed.

## 2012-09-30 ENCOUNTER — Telehealth: Payer: Self-pay | Admitting: Gastroenterology

## 2012-09-30 ENCOUNTER — Telehealth: Payer: Self-pay

## 2012-09-30 NOTE — Telephone Encounter (Signed)
  Follow up Call-  Call back number 09/29/2012  Post procedure Call Back phone  # [phone]  Permission to leave phone message Yes     Patient questions:  Do you have a fever, pain , or abdominal swelling? no Pain Score  0 *  Have you tolerated food without any problems? yes  Have you been able to return to your normal activities? yes  Do you have any questions about your discharge instructions: Diet   no Medications  no Follow up visit  no  Do you have questions or concerns about your Care? no  Actions: * If pain score is 4 or above: No action needed, pain <4.

## 2012-09-30 NOTE — Telephone Encounter (Signed)
Spoke to pt. Let her know she has an appointment with Cecille Po on 10/09/2012 @ 10:30am. At [facility] primary care on the 1st floor.  Pt stated understanding of appointment

## 2012-10-09 ENCOUNTER — Other Ambulatory Visit (INDEPENDENT_AMBULATORY_CARE_PROVIDER_SITE_OTHER): Payer: Medicare Other

## 2012-10-09 ENCOUNTER — Encounter: Payer: Self-pay | Admitting: Internal Medicine

## 2012-10-09 ENCOUNTER — Ambulatory Visit (INDEPENDENT_AMBULATORY_CARE_PROVIDER_SITE_OTHER): Payer: Medicare Other | Admitting: Internal Medicine

## 2012-10-09 VITALS — BP 134/86 | HR 82 | Temp 98.0°F | Ht 65.5 in | Wt 285.0 lb

## 2012-10-09 DIAGNOSIS — Z0000 Encounter for general adult medical examination without abnormal findings: Secondary | ICD-10-CM

## 2012-10-09 DIAGNOSIS — E119 Type 2 diabetes mellitus without complications: Secondary | ICD-10-CM

## 2012-10-09 DIAGNOSIS — E785 Hyperlipidemia, unspecified: Secondary | ICD-10-CM | POA: Diagnosis not present

## 2012-10-09 DIAGNOSIS — K219 Gastro-esophageal reflux disease without esophagitis: Secondary | ICD-10-CM

## 2012-10-09 DIAGNOSIS — I10 Essential (primary) hypertension: Secondary | ICD-10-CM

## 2012-10-09 DIAGNOSIS — Z23 Encounter for immunization: Secondary | ICD-10-CM

## 2012-10-09 LAB — LIPID PANEL
Cholesterol: 198 mg/dL (ref 0–200)
VLDL: 12.4 mg/dL (ref 0.0–40.0)

## 2012-10-09 LAB — MICROALBUMIN / CREATININE URINE RATIO
Creatinine,U: 448 mg/dL
Microalb Creat Ratio: 0.6 mg/g (ref 0.0–30.0)
Microalb, Ur: 2.7 mg/dL — ABNORMAL HIGH (ref 0.0–1.9)

## 2012-10-09 NOTE — Progress Notes (Signed)
Subjective:    Patient ID: Alicia [name], female    DOB: 1946/07/18, 66 y.o.   MRN: [id number]  HPI  Pt presents to the clinic to establish care. Her only concern at this visit is her weight gain. She has gained a considerable amount of weight over the past year. She has had gastric bypass in the past, but has never been below 200 lbs. She does report increased stress, as she is the primary caregiver for her sister who is hemipalegic after a stroke. She is maintaining a 1200 calorie diet, but is not exercising at this time. Review of Systems  Past Medical History  Diagnosis Date  . Diabetes mellitus   . Hypertension   . Hyperlipidemia   . Obesity   . GERD (gastroesophageal reflux disease)   . Anemia   . Blood transfusion without reported diagnosis   . Cataract     Current Outpatient Prescriptions  Medication Sig Dispense Refill  . doxazosin (CARDURA) 4 MG tablet Take 1 tablet (4 mg total) by mouth at bedtime.  30 tablet  1  . esomeprazole (NEXIUM) 40 MG capsule Take 1 capsule (40 mg total) by mouth 2 (two) times daily.  60 capsule  3  . losartan (COZAAR) 100 MG tablet Take 1 tablet (100 mg total) by mouth daily.  30 tablet  0  . pioglitazone-metformin (ACTOPLUS MET) 15-850 MG per tablet Take 1 tablet by mouth 2 (two) times daily with a meal.  30 tablet  0  . pravastatin (PRAVACHOL) 20 MG tablet Take 1 tablet (20 mg total) by mouth daily.  30 tablet  1  . predniSONE (DELTASONE) 50 MG tablet Take one daily for 5 days  5 tablet  0  . sitaGLIPtin (JANUVIA) 100 MG tablet Take 100 mg by mouth daily.        [name] spironolactone (ALDACTONE) 25 MG tablet Take 1 tablet (25 mg total) by mouth daily.  30 tablet  1  . traMADol (ULTRAM) 50 MG tablet Take 1-2 tablets (50-100 mg total) by mouth every 6 (six) hours as needed for pain.  45 tablet  0    Allergies  Allergen Reactions  . Iodinated Diagnostic Agents   . Sulfa Antibiotics     Family History  Problem Relation Age of Onset  . Stomach  cancer Sister   . Diabetes Mother   . Diabetes Sister     History   Social History  . Marital Status: Divorced    Spouse Name: N/A    Number of Children: 3  . Years of Education: N/A   Occupational History  . Not on file.   Social History Main Topics  . Smoking status: Never Smoker   . Smokeless tobacco: Never Used  . Alcohol Use: No  . Drug Use: No  . Sexually Active: Not on file   Other Topics Concern  . Not on file   Social History Narrative  . No narrative on file     Constitutional: Pt reports weight gain. Denies fever, malaise, fatigue or headache. HEENT: Denies eye pain, eye redness, ear pain, ringing in the ears, wax buildup, runny nose, nasal congestion, bloody nose, or sore throat. Respiratory: Denies difficulty breathing, shortness of breath, cough or sputum production.   Cardiovascular: Denies chest pain, chest tightness, palpitations or swelling in the hands or feet.  Gastrointestinal: Denies abdominal pain, bloating, constipation, diarrhea or blood in the stool.  GU: Denies urgency, frequency, pain with urination, burning sensation, blood in urine,  odor or discharge. Musculoskeletal: Denies decrease in range of motion, difficulty with gait, muscle pain or joint pain and swelling.  Skin: Denies redness, rashes, lesions or ulcercations.  Neurological: Denies dizziness, difficulty with memory, difficulty with speech or problems with balance and coordination.   No other specific complaints in a complete review of systems (except as listed in HPI above).     Objective:   Physical Exam  BP 134/86  Pulse 82  Temp 98 F (36.7 C) (Oral)  Ht 5' 5.5" (1.664 m)  Wt 285 lb (129.275 kg)  BMI 46.71 kg/m2  SpO2 97% Wt Readings from Last 3 Encounters:  10/09/12 285 lb (129.275 kg)  09/29/12 286 lb (129.729 kg)  09/23/12 286 lb (129.729 kg)    General: Appears her stated age, obese butwell developed, well nourished in NAD. Skin: Warm, dry and intact. No  rashes, lesions or ulcerations noted. HEENT: Head: normal shape and size; Eyes: sclera white, no icterus, conjunctiva pink, PERRLA and EOMs intact; Ears: Tm's gray and intact, normal light reflex; Nose: mucosa pink and moist, septum midline; Throat/Mouth: Teeth present, mucosa pink and moist, no exudate, lesions or ulcerations noted.  Neck: Normal range of motion. Neck supple, trachea midline. No massses, lumps or thyromegaly present.  Cardiovascular: Normal rate and rhythm. S1,S2 noted.  No murmur, rubs or gallops noted. No JVD or BLE edema. No carotid bruits noted. Pulmonary/Chest: Normal effort and positive vesicular breath sounds. No respiratory distress. No wheezes, rales or ronchi noted.  Abdomen: Soft and nontender. Normal bowel sounds, no bruits noted. No distention or masses noted. Liver, spleen and kidneys non palpable. Musculoskeletal: Normal range of motion. No signs of joint swelling. No difficulty with gait.  Neurological: Alert and oriented. Cranial nerves II-XII intact. Coordination normal. +DTRs bilaterally. Psychiatric: Mood and affect normal. Behavior is normal. Judgment and thought content normal.   EKG:       Assessment & Plan:  Preventative Health Maintenance:  Will obtain a EKG today. Tdap, flu and pneumovax given today.  Hyperlipidemia, chronic problem with additional workup required:  Will obtain lipid profile. Continue taking pravastatin at this time. Consume a low fat diet avoiding fried foods and fast food. Exercise for at least 30 mins 4 days out of the week.  Diabetes Mellitus, chronic problem with additional workup required:  Will obtain HgbA1c and urine microalbumin Continue taking Januvia and Actoplus Met at this time. Avoid foods high in carbohydrates: Exercise for at least 30 mins 4 days out of the week.  Hypertension,chronic problem, with additional workup required:  Consume a low salt diet. Contine taking Cardura, Cozaar, and Aldactone at  this time. Exercise for at least 30 mins 4 days out of the week.  GERD, chronic problem, no additional workup required:  Continue taking Nexium at this time.  RTC in 3 months for DM/HTN/ HLD followup

## 2012-10-09 NOTE — Addendum Note (Signed)
Addended by: Rebeca Alert C on: 10/09/2012 11:33 AM   Modules accepted: Orders

## 2012-10-09 NOTE — Patient Instructions (Addendum)

## 2012-10-10 ENCOUNTER — Telehealth: Payer: Self-pay | Admitting: *Deleted

## 2012-10-10 ENCOUNTER — Encounter: Payer: Self-pay | Admitting: Internal Medicine

## 2012-10-10 NOTE — Telephone Encounter (Signed)
Pt informed of results and of MD's advisement.    Ash,   Can you please send this to Ms. Alicia [name]. Also, can you call her to let her know that her lipid panel shows that your LDL (bad cholesterol) is elevated. Continue taking your pravastatin and try to consume a low fat diet, avoiding fried foods and fast foods.  Your urine microalbumin is high. This indicates that your kidneys are being affected by your diabetes and hypertension. We need to keep these conditions under control to   prevent long term damage to your kidneys.

## 2012-10-21 ENCOUNTER — Telehealth: Payer: Self-pay | Admitting: Internal Medicine

## 2012-10-21 DIAGNOSIS — K219 Gastro-esophageal reflux disease without esophagitis: Secondary | ICD-10-CM

## 2012-10-21 DIAGNOSIS — K921 Melena: Secondary | ICD-10-CM

## 2012-10-21 DIAGNOSIS — R1013 Epigastric pain: Secondary | ICD-10-CM

## 2012-10-21 MED ORDER — LOSARTAN POTASSIUM 100 MG PO TABS: 100.0000 mg | ORAL_TABLET | Freq: Every day | ORAL | Status: DC

## 2012-10-21 MED ORDER — PIOGLITAZONE HCL-METFORMIN HCL 15-850 MG PO TABS: 1.0000 | ORAL_TABLET | Freq: Two times a day (BID) | ORAL | Status: DC

## 2012-10-21 NOTE — Telephone Encounter (Signed)
Sent in Cozaar and Actoplus to pt's pharmacy

## 2012-11-20 ENCOUNTER — Other Ambulatory Visit: Payer: Self-pay | Admitting: Gastroenterology

## 2012-11-24 ENCOUNTER — Telehealth: Payer: Self-pay | Admitting: Internal Medicine

## 2012-11-24 DIAGNOSIS — K921 Melena: Secondary | ICD-10-CM

## 2012-11-24 DIAGNOSIS — K219 Gastro-esophageal reflux disease without esophagitis: Secondary | ICD-10-CM

## 2012-11-24 DIAGNOSIS — R1013 Epigastric pain: Secondary | ICD-10-CM

## 2012-11-24 MED ORDER — ESOMEPRAZOLE MAGNESIUM 40 MG PO CPDR: 40.0000 mg | DELAYED_RELEASE_CAPSULE | Freq: Two times a day (BID) | ORAL | Status: DC

## 2012-11-24 MED ORDER — PIOGLITAZONE HCL-METFORMIN HCL 15-850 MG PO TABS: 1.0000 | ORAL_TABLET | Freq: Two times a day (BID) | ORAL | Status: DC

## 2012-11-24 MED ORDER — SPIRONOLACTONE 25 MG PO TABS: 25.0000 mg | ORAL_TABLET | Freq: Every day | ORAL | Status: DC

## 2012-11-24 MED ORDER — LOSARTAN POTASSIUM 100 MG PO TABS: 100.0000 mg | ORAL_TABLET | Freq: Every day | ORAL | Status: DC

## 2012-11-24 MED ORDER — SITAGLIPTIN PHOSPHATE 100 MG PO TABS: 100.0000 mg | ORAL_TABLET | Freq: Every day | ORAL | Status: DC

## 2012-11-24 MED ORDER — PRAVASTATIN SODIUM 20 MG PO TABS: 20.0000 mg | ORAL_TABLET | Freq: Every day | ORAL | Status: DC

## 2012-11-24 NOTE — Telephone Encounter (Signed)
Rx's sent to [facility] as pt requested.

## 2012-11-24 NOTE — Telephone Encounter (Signed)
Patient called stating that her medications were never called in to her pharmacy when she was here for her visit, called patient to verify which medications she is requesting and her pharmacy, unable to leave a message

## 2012-12-22 ENCOUNTER — Other Ambulatory Visit: Payer: Self-pay | Admitting: Internal Medicine

## 2012-12-22 ENCOUNTER — Other Ambulatory Visit: Payer: Self-pay | Admitting: Gastroenterology

## 2013-01-09 ENCOUNTER — Ambulatory Visit: Payer: Medicare Other | Admitting: Internal Medicine

## 2013-01-12 ENCOUNTER — Encounter: Payer: Self-pay | Admitting: Internal Medicine

## 2013-01-12 ENCOUNTER — Ambulatory Visit (INDEPENDENT_AMBULATORY_CARE_PROVIDER_SITE_OTHER): Payer: Medicare Other | Admitting: Internal Medicine

## 2013-01-12 VITALS — BP 140/98 | HR 82 | Temp 97.3°F | Ht 65.5 in | Wt 284.0 lb

## 2013-01-12 DIAGNOSIS — Z9884 Bariatric surgery status: Secondary | ICD-10-CM

## 2013-01-12 DIAGNOSIS — E785 Hyperlipidemia, unspecified: Secondary | ICD-10-CM

## 2013-01-12 DIAGNOSIS — E119 Type 2 diabetes mellitus without complications: Secondary | ICD-10-CM

## 2013-01-12 DIAGNOSIS — I10 Essential (primary) hypertension: Secondary | ICD-10-CM | POA: Diagnosis not present

## 2013-01-12 NOTE — Assessment & Plan Note (Signed)
Well controlled Continue to avoid sodium in you diet No treatment changes at this time Can have blood pressure checked at your nearest fire station.  Continue exercising as much as possible.

## 2013-01-12 NOTE — Assessment & Plan Note (Signed)
Unable to exercise due to arthritis in knees Will refer to bariatric surgery for additional evaluation

## 2013-01-12 NOTE — Assessment & Plan Note (Signed)
Continue to avoid fat in your diet No treatment changes at this time Will recheck lipids in 1 year

## 2013-01-12 NOTE — Patient Instructions (Signed)

## 2013-01-12 NOTE — Progress Notes (Signed)
Subjective:    Patient ID: Alicia [name], female    DOB: 1946/07/03, 67 y.o.   MRN: [id number]  HPI  Pt presents to the clinic today for 3 month F/U of HTN, HLD, DM and obesity. She has not lost any weight since her last visit. This is very distressing to her. She is walking 3 times a week but is not able to walk very fast due to the arthritis in her knees. She is under a lot of emotional stress, with the death of one of her sisters and she is the primary caregiver for her other sister who has suffered a stroke. She is trying to maintain a 1200 calorie diet. Her blood pressure she has not really been monitoring at home. They do have a wrist cuff but she feels like it is not accurate. She has continued to avoid sodium in her diet. Her blood sugars range from 110-215. She typically only tests once per day. She has not had any issues with any of her medications. She reports no problems at this time.  Review of Systems      Past Medical History  Diagnosis Date  . Diabetes mellitus   . Hypertension   . Hyperlipidemia   . Obesity   . GERD (gastroesophageal reflux disease)   . Anemia   . Blood transfusion without reported diagnosis   . Cataract   . Arthritis     Current Outpatient Prescriptions  Medication Sig Dispense Refill  . doxazosin (CARDURA) 4 MG tablet Take 1 tablet (4 mg total) by mouth at bedtime.  30 tablet  1  . esomeprazole (NEXIUM) 40 MG capsule Take 1 capsule (40 mg total) by mouth 2 (two) times daily.  60 capsule  11  . losartan (COZAAR) 100 MG tablet Take 1 tablet (100 mg total) by mouth daily.  30 tablet  11  . pioglitazone-metformin (ACTOPLUS MET) 15-850 MG per tablet Take 1 tablet by mouth 2 (two) times daily with a meal.  60 tablet  11  . pravastatin (PRAVACHOL) 20 MG tablet Take 1 tablet (20 mg total) by mouth daily.  30 tablet  11  . sitaGLIPtin (JANUVIA) 100 MG tablet Take 1 tablet (100 mg total) by mouth daily.  30 tablet  11  . spironolactone (ALDACTONE) 25 MG  tablet Take 1 tablet (25 mg total) by mouth daily.  30 tablet  11  . traMADol (ULTRAM) 50 MG tablet Take 1-2 tablets (50-100 mg total) by mouth every 6 (six) hours as needed for pain.  45 tablet  0   No current facility-administered medications for this visit.    Allergies  Allergen Reactions  . Iodinated Diagnostic Agents   . Sulfa Antibiotics     Family History  Problem Relation Age of Onset  . Stomach cancer Sister   . Diabetes Sister   . Diabetes Mother   . Heart attack Mother   . Hypertension Mother   . Hyperlipidemia Mother   . Diabetes Sister   . Heart attack Father   . Stroke Father   . Hypertension Father   . Hyperlipidemia Father   . Diabetes Sister     History   Social History  . Marital Status: Divorced    Spouse Name: N/A    Number of Children: 3  . Years of Education: doctorate   Occupational History  . Teacher    Social History Main Topics  . Smoking status: Never Smoker   . Smokeless tobacco: Never Used  .  Alcohol Use: No  . Drug Use: No  . Sexually Active: Not on file   Other Topics Concern  . Not on file   Social History Narrative   Caffeine use-yes   Regular exercise-no     Constitutional: Pt reports difficulty losing weight. Denies fever, malaise, fatigue, headache or abrupt weight changes.  HEENT: Denies blurred vision, eye pain, eye redness, ear pain, ringing in the ears, wax buildup, runny nose, nasal congestion, bloody nose, or sore throat. Respiratory: Denies difficulty breathing, shortness of breath, cough or sputum production.   Cardiovascular: Denies chest pain, chest tightness, palpitations or swelling in the hands or feet.   Skin: Denies redness, rashes, lesions or ulcercations.  Neurological: Denies dizziness, difficulty with memory, difficulty with speech or problems with balance and coordination.   No other specific complaints in a complete review of systems (except as listed in HPI above).  Objective:   Physical  Exam   BP 140/98  Pulse 82  Temp(Src) 97.3 F (36.3 C) (Oral)  Ht 5' 5.5" (1.664 m)  Wt 284 lb (128.822 kg)  BMI 46.52 kg/m2  SpO2 99% Wt Readings from Last 3 Encounters:  01/12/13 284 lb (128.822 kg)  10/09/12 285 lb (129.275 kg)  09/29/12 286 lb (129.729 kg)    General: Appears her stated age, obese but well developed, well nourished in NAD. Skin: Warm, dry and intact. No rashes, lesions or ulcerations noted. HEENT: Head: normal shape and size; Eyes: sclera white, no icterus, conjunctiva pink, PERRLA and EOMs intact; Ears: Tm's gray and intact, normal light reflex; Nose: mucosa pink and moist, septum midline; Throat/Mouth: Teeth present, mucosa pink and moist, no exudate, lesions or ulcerations noted.  Cardiovascular: Normal rate and rhythm. S1,S2 noted.  No murmur, rubs or gallops noted. No JVD or BLE edema. No carotid bruits noted. Pulmonary/Chest: Normal effort and positive vesicular breath sounds. No respiratory distress. No wheezes, rales or ronchi noted.  Neurological: Alert and oriented. Cranial nerves II-XII intact. Coordination normal. +DTRs bilaterally.   BMET    Component Value Date/Time   NA 141 09/23/2012 1006   K 4.7 09/23/2012 1006   CL 103 09/23/2012 1006   CO2 28 09/23/2012 1006   GLUCOSE 149* 09/23/2012 1006   BUN 21 09/23/2012 1006   CREATININE 1.3* 09/23/2012 1006   CALCIUM 9.2 09/23/2012 1006   GFRNONAA 55* 10/13/2011 0510   GFRAA 64* 10/13/2011 0510    Lipid Panel     Component Value Date/Time   CHOL 198 10/09/2012 1131   TRIG 62.0 10/09/2012 1131   HDL 48.20 10/09/2012 1131   CHOLHDL 4 10/09/2012 1131   VLDL 12.4 10/09/2012 1131   LDLCALC 137* 10/09/2012 1131    CBC    Component Value Date/Time   WBC 6.6 09/23/2012 1006   RBC 5.00 09/23/2012 1006   HGB 10.8* 09/23/2012 1006   HCT 35.1* 09/23/2012 1006   PLT 233.0 09/23/2012 1006   MCV 70.2* 09/23/2012 1006   MCH 22.2* 10/13/2011 0510   MCHC 30.7 09/23/2012 1006   RDW 16.4* 09/23/2012 1006    LYMPHSABS 1.4 10/12/2011 1625   MONOABS 0.4 10/12/2011 1625   EOSABS 0.1 10/12/2011 1625   BASOSABS 0.0 10/12/2011 1625    Hgb A1C Lab Results  Component Value Date   HGBA1C 6.6* 10/09/2012        Assessment & Plan:   RTC in 6 months for follow up and lab work

## 2013-01-12 NOTE — Assessment & Plan Note (Signed)
Well controlled Continue to avoid carbs in your diet Continue to exercise as much as possible No treatment changes at this time

## 2013-01-22 ENCOUNTER — Ambulatory Visit (INDEPENDENT_AMBULATORY_CARE_PROVIDER_SITE_OTHER): Payer: Medicare Other | Admitting: Internal Medicine

## 2013-01-22 ENCOUNTER — Encounter: Payer: Self-pay | Admitting: Internal Medicine

## 2013-01-22 VITALS — BP 168/92 | HR 82 | Temp 97.9°F | Ht 65.5 in | Wt 288.0 lb

## 2013-01-22 DIAGNOSIS — E669 Obesity, unspecified: Secondary | ICD-10-CM | POA: Diagnosis not present

## 2013-01-22 NOTE — Assessment & Plan Note (Signed)
Will request medical records from [facility] for prior bariatric surgery Pt will work on diet and exercise in order to lose a goal of 5 pounds over the next month I will fill out forms and fax to [place]

## 2013-01-22 NOTE — Patient Instructions (Signed)
1200 Calorie Diabetic Diet The 1200 calorie diabetic diet limits calories to 1200 each day. Following this diet and making healthy meal choices can help improve overall health. It controls blood glucose (sugar) levels and can also help lower blood pressure and cholesterol.  SERVING SIZES Measuring foods and serving sizes helps to make sure you are getting the right amount of food. The list below tells how big or small some common serving sizes are.   1 oz.........4 stacked dice.  3 oz........Marland KitchenDeck of cards.  1 tsp.......Marland KitchenTip of little finger.  1 tbs......Marland KitchenMarland KitchenThumb.  2 tbs.......Marland KitchenGolf ball.   cup......Marland KitchenHalf of a fist.  1 cup.......Marland KitchenA fist. GUIDELINES FOR CHOOSING FOODS The goal of this diet is to eat a variety of foods and limit calories to 1200 each day. This can be done by choosing foods that are low in calories and fat. The diet also suggests eating small amounts of food frequently. Doing this helps control your blood glucose levels, so they do not get too high or too low. Each meal or snack may include a protein food source to help you feel more satisfied. Try to eat about the same amount of food around the same time each day. This includes weekend days, travel days, and days off work. Space your meals about 4 to 5 hours apart, and add a snack between them, if you wish.  For example, a daily food plan could include breakfast, a morning snack, lunch, dinner, and an evening snack. Healthy meals and snacks have different types of foods, including whole grains, vegetables, fruits, lean meats, poultry, fish, and dairy products. As you plan your meals, select a variety of foods. Choose from the bread and starch, vegetable, fruit, dairy, and meat/protein groups. Examples of foods from each group are listed below, with their suggested serving sizes. Use measuring cups and spoons to become familiar with what a healthy portion looks like. Bread and Starch Each serving equals 15 grams of  carbohydrate.  1 slice bread.   bagel.   cup cold cereal (unsweetened).   cup hot cereal or mashed potatoes.  1 small potato (size of a computer mouse).   cup cooked pasta or rice.   English muffin.  1 cup broth-based soup.  3 cups of popcorn.  4 to 6 whole-wheat crackers.   cup cooked beans, peas, or corn. Vegetables Each serving equals 5 grams of carbohydrate.   cup cooked vegetables.  1 cup raw vegetables.   cup tomato or vegetable juice. Fruit Each serving equals 15 grams of carbohydrate.  1 small apple or orange.  1  cup watermelon or strawberries.   cup applesauce (no sugar added).  2 tbs raisins.   banana.   cup canned fruit, packed in water or in its own juice.   cup unsweetened fruit juice. Dairy Each serving equals 12 to 15 grams of carbohydrate.  1 cup fat-free milk.  6 oz artificially sweetened yogurt or plain yogurt.  1 cup low-fat buttermilk.  1 cup soy milk.  1 cup almond milk. Meat/Protein  1 large egg.  2 to 3 oz meat, poultry, or fish.   cup low-fat cottage cheese.  1 tbs peanut butter.  1 oz low-fat cheese.   cup tuna, packed in water.   cup tofu. Fat  1 tsp oil.  1 tsp trans-fat-free margarine.  1 tsp butter.  1 tsp mayonnaise.  2 tbs avocado.  1 tbs salad dressing.  1 tbs cream cheese.  2 tbs sour cream. SAMPLE 1200 CALORIE  DIET PLAN Breakfast  1 cup fat-free milk (1 carb serving).  1 small orange (1 carb serving).  1 scrambled egg.  1 slice whole-wheat toast (1 carb serving). Lunch  Sandwich  2 slices whole-wheat bread (2 carb servings).  2 oz lean meat.  2 tsp reduced fat mayonnaise.  1 lettuce leaf.  2 slices tomato.  1 cup carrot sticks. Afternoon Snack  1 small apple (1 carb serving).  1 string cheese. Dinner  2 oz meat.  1 small baked potato (1 carb serving).  1 tsp trans-fat-free margarine.  1 cup steamed broccoli.  1 cup fat-free milk (1 carb  serving). Evening Snack   small banana (1 carb serving).  6 vanilla wafers (1 carb serving). MEAL PLAN You can use this worksheet to help you make a daily meal plan based on the 1200 calorie diabetic diet suggestions. If you are using this plan to help you control your blood glucose, you may interchange carbohydrate containing foods (dairy, starches, and fruits). Select a variety of fresh foods of varying colors and flavors. The total amount of carbohydrate in your meals or snacks is more important than making sure you include all of the food groups every time you eat. You can choose from approximately this many of the following foods to build your day's meals:  6 Starches.  3 Vegetables.  2 Fruits.  2 Dairy.  4 to 6 oz Meat/Protein.  Up to 3 Fats. Your dietician can use this worksheet to help you decide how many servings and which types of foods are right for you. BREAKFAST Food Group and Servings / Food Choice Starch _________________________________________________________ Dairy __________________________________________________________ Fruit ___________________________________________________________ Meat/Protein____________________________________________________ Fat ____________________________________________________________ LUNCH Food Group and Servings / Food Choice  Starch _________________________________________________________ Meat/Protein ___________________________________________________ Vegetables _____________________________________________________ Fruit __________________________________________________________ Dairy __________________________________________________________ Fat ____________________________________________________________ Wilhemina Bonito Food Group and Servings / Food Choice Dairy __________________________________________________________ Fruit ___________________________________________________________ Starch  __________________________________________________________ Meat/Protein____________________________________________________ Wonda Cheng Food Group and Servings / Food Choice Starch _________________________________________________________ Meat/Protein ___________________________________________________ Dairy __________________________________________________________ Vegetables _____________________________________________________ Fruit __________________________________________________________ Fat ____________________________________________________________ Fermin Schwab Food Group and Servings / Food Choice Fruit ___________________________________________________________ Meat/Protein ____________________________________________________ Dairy __________________________________________________________ Starch __________________________________________________________ DAILY TOTALS Starches _________________________ Vegetables _______________________ Fruits ____________________________ Dairy ____________________________ Meat/Protein______________________ Fats _____________________________ Document Released: 05/28/2005 Document Revised: 01/28/2012 Document Reviewed: 09/22/2009 [employer] Patient Information 2013 [place], [place].

## 2013-01-22 NOTE — Progress Notes (Signed)
Subjective:    Patient ID: Alicia [name], female    DOB: Apr 16, 1946, 67 y.o.   MRN: [id number]  HPI  Pt presents to the clinic today to talk about her weight loss surgery. She has been evaluated by [facility], and would like to move forward with the bariatric surgery. She did previously have her stomach stapled back i n1984. She did lose a good amount of weight, but over the years has gained it all back. She does take measures to lose weight. She is trying to maintain a 1200 calorie diet. She does walk 1-2.5 miles in the mornings over the course of 30 minutes to 1 hour. She does do this 5 days a week. She is not able to walk at a much faster pace due to the pain from arthritis in the knees. In the past 10 days, she has gained 4 pounds. She has been seeing a nutritionist who is helping her make better diet choices.  Review of Systems      Past Medical History  Diagnosis Date  . Diabetes mellitus   . Hypertension   . Hyperlipidemia   . Obesity   . GERD (gastroesophageal reflux disease)   . Anemia   . Blood transfusion without reported diagnosis   . Cataract   . Arthritis     Current Outpatient Prescriptions  Medication Sig Dispense Refill  . doxazosin (CARDURA) 4 MG tablet Take 1 tablet (4 mg total) by mouth at bedtime.  30 tablet  1  . esomeprazole (NEXIUM) 40 MG capsule Take 1 capsule (40 mg total) by mouth 2 (two) times daily.  60 capsule  11  . losartan (COZAAR) 100 MG tablet Take 1 tablet (100 mg total) by mouth daily.  30 tablet  11  . pioglitazone-metformin (ACTOPLUS MET) 15-850 MG per tablet Take 1 tablet by mouth 2 (two) times daily with a meal.  60 tablet  11  . pravastatin (PRAVACHOL) 20 MG tablet Take 1 tablet (20 mg total) by mouth daily.  30 tablet  11  . sitaGLIPtin (JANUVIA) 100 MG tablet Take 1 tablet (100 mg total) by mouth daily.  30 tablet  11  . spironolactone (ALDACTONE) 25 MG tablet Take 1 tablet (25 mg total) by mouth daily.  30 tablet  11  . traMADol (ULTRAM) 50 MG  tablet Take 1-2 tablets (50-100 mg total) by mouth every 6 (six) hours as needed for pain.  45 tablet  0   No current facility-administered medications for this visit.    Allergies  Allergen Reactions  . Iodinated Diagnostic Agents   . Sulfa Antibiotics     Family History  Problem Relation Age of Onset  . Stomach cancer Sister   . Diabetes Sister   . Diabetes Mother   . Heart attack Mother   . Hypertension Mother   . Hyperlipidemia Mother   . Diabetes Sister   . Heart attack Father   . Stroke Father   . Hypertension Father   . Hyperlipidemia Father   . Diabetes Sister     History   Social History  . Marital Status: Divorced    Spouse Name: N/A    Number of Children: 3  . Years of Education: doctorate   Occupational History  . Teacher    Social History Main Topics  . Smoking status: Never Smoker   . Smokeless tobacco: Never Used  . Alcohol Use: No  . Drug Use: No  . Sexually Active: Not on file  Other Topics Concern  . Not on file   Social History Narrative   Caffeine use-yes   Regular exercise-no     Constitutional: Denies fever, malaise, fatigue, headache or abrupt weight changes.  Respiratory: Denies difficulty breathing, shortness of breath, cough or sputum production.   Cardiovascular: Denies chest pain, chest tightness, palpitations or swelling in the hands or feet.  Musculoskeletal: Pt reports arthritis in bilateral knees. Denies decrease in range of motion, difficulty with gait, muscle pain or joint swelling.    No other specific complaints in a complete review of systems (except as listed in HPI above).  Objective:   Physical Exam  BP 168/92  Pulse 82  Temp(Src) 97.9 F (36.6 C) (Oral)  Ht 5' 5.5" (1.664 m)  Wt 288 lb (130.636 kg)  BMI 47.18 kg/m2  SpO2 99% Wt Readings from Last 3 Encounters:  01/22/13 288 lb (130.636 kg)  01/12/13 284 lb (128.822 kg)  10/09/12 285 lb (129.275 kg)    General: Appears her stated age, obese but  developed, well nourished in NAD. Cardiovascular: Normal rate and rhythm. S1,S2 noted.  No murmur, rubs or gallops noted. No JVD or BLE edema. No carotid bruits noted. Pulmonary/Chest: Normal effort and positive vesicular breath sounds. No respiratory distress. No wheezes, rales or ronchi noted.  Musculoskeletal: Normal range of motion. Crepitus with ROM of knees. No signs of joint swelling. No difficulty with gait.      Assessment & Plan:   RTC in 1 month to assess weight loss

## 2013-03-11 ENCOUNTER — Ambulatory Visit: Payer: Medicare PPO | Admitting: Internal Medicine

## 2013-03-11 DIAGNOSIS — Z0289 Encounter for other administrative examinations: Secondary | ICD-10-CM

## 2013-03-12 ENCOUNTER — Ambulatory Visit (INDEPENDENT_AMBULATORY_CARE_PROVIDER_SITE_OTHER): Payer: Medicare PPO | Admitting: Internal Medicine

## 2013-03-12 ENCOUNTER — Encounter: Payer: Self-pay | Admitting: Internal Medicine

## 2013-03-12 VITALS — BP 156/102 | HR 82 | Temp 98.1°F | Ht 65.5 in | Wt 285.2 lb

## 2013-03-12 DIAGNOSIS — K219 Gastro-esophageal reflux disease without esophagitis: Secondary | ICD-10-CM

## 2013-03-12 DIAGNOSIS — E669 Obesity, unspecified: Secondary | ICD-10-CM

## 2013-03-12 DIAGNOSIS — I10 Essential (primary) hypertension: Secondary | ICD-10-CM

## 2013-03-12 MED ORDER — PANTOPRAZOLE SODIUM 40 MG PO TBEC: 40.0000 mg | DELAYED_RELEASE_TABLET | Freq: Every day | ORAL | Status: DC

## 2013-03-12 NOTE — Patient Instructions (Signed)

## 2013-03-12 NOTE — Assessment & Plan Note (Signed)
Elevated today Pt states  She had not taken her meds this morning She also reports being very stressed at the moment having to deal with her the insurance company about her medications  Will assess BP at next visit. Try to take your meds before your next appoint

## 2013-03-12 NOTE — Assessment & Plan Note (Signed)
Will switch Nexium to protonix

## 2013-03-12 NOTE — Progress Notes (Signed)
Subjective:    Patient ID: Alicia [name], female    DOB: 01-May-1946, 67 y.o.   MRN: [id number]  HPI  Pt presents to the clinic today f/u with plans about her weight loss surgery. She has completed 1 month of the required 6 month wait period before she can proceed with the bariatric surgery. She has lost 3 pounds. She is frusterated with this amount. She really thought she would be down at least 10 lbs. She does take measures to lose weight. She is trying to maintain a 1200 calorie diet. She does walk 1-2.5 miles in the mornings over the course of 30 minutes to 1 hour. She does do this 5 days a week. She is not able to walk at a much faster pace due to the pain from arthritis in the knees. She has been seeing a nutritionist who is helping her make better diet choices. She did previously have her stomach stapled back in 1984. Additionally today, she request that her Nexium be switched to something else. Her nexium cost her 40$ per month and she cannot afford that on a fixed income.   Review of Systems      Past Medical History  Diagnosis Date  . Diabetes mellitus   . Hypertension   . Hyperlipidemia   . Obesity   . GERD (gastroesophageal reflux disease)   . Anemia   . Blood transfusion without reported diagnosis   . Cataract   . Arthritis     Current Outpatient Prescriptions  Medication Sig Dispense Refill  . doxazosin (CARDURA) 4 MG tablet Take 1 tablet (4 mg total) by mouth at bedtime.  30 tablet  1  . losartan (COZAAR) 100 MG tablet Take 1 tablet (100 mg total) by mouth daily.  30 tablet  11  . pioglitazone-metformin (ACTOPLUS MET) 15-850 MG per tablet Take 1 tablet by mouth 2 (two) times daily with a meal.  60 tablet  11  . pravastatin (PRAVACHOL) 20 MG tablet Take 1 tablet (20 mg total) by mouth daily.  30 tablet  11  . sitaGLIPtin (JANUVIA) 100 MG tablet Take 1 tablet (100 mg total) by mouth daily.  30 tablet  11  . spironolactone (ALDACTONE) 25 MG tablet Take 1 tablet (25 mg  total) by mouth daily.  30 tablet  11  . traMADol (ULTRAM) 50 MG tablet Take 1-2 tablets (50-100 mg total) by mouth every 6 (six) hours as needed for pain.  45 tablet  0  . esomeprazole (NEXIUM) 40 MG capsule Take 1 capsule (40 mg total) by mouth 2 (two) times daily.  60 capsule  11   No current facility-administered medications for this visit.    Allergies  Allergen Reactions  . Iodinated Diagnostic Agents   . Sulfa Antibiotics     Family History  Problem Relation Age of Onset  . Stomach cancer Sister   . Diabetes Sister   . Diabetes Mother   . Heart attack Mother   . Hypertension Mother   . Hyperlipidemia Mother   . Diabetes Sister   . Heart attack Father   . Stroke Father   . Hypertension Father   . Hyperlipidemia Father   . Diabetes Sister     History   Social History  . Marital Status: Divorced    Spouse Name: N/A    Number of Children: 3  . Years of Education: doctorate   Occupational History  . Teacher    Social History Main Topics  . Smoking  status: Never Smoker   . Smokeless tobacco: Never Used  . Alcohol Use: No  . Drug Use: No  . Sexually Active: Not on file   Other Topics Concern  . Not on file   Social History Narrative   Caffeine use-yes   Regular exercise-no     Constitutional: Denies fever, malaise, fatigue, headache or abrupt weight changes.  Respiratory: Denies difficulty breathing, shortness of breath, cough or sputum production.   Cardiovascular: Denies chest pain, chest tightness, palpitations or swelling in the hands or feet.  Musculoskeletal: Pt reports arthritis in bilateral knees. Denies decrease in range of motion, difficulty with gait, muscle pain or joint swelling.    No other specific complaints in a complete review of systems (except as listed in HPI above).  Objective:   Physical Exam  BP 156/102  Pulse 82  Temp(Src) 98.1 F (36.7 C) (Oral)  Ht 5' 5.5" (1.664 m)  Wt 285 lb 4 oz (129.389 kg)  BMI 46.73 kg/m2  SpO2  98% Wt Readings from Last 3 Encounters:  03/12/13 285 lb 4 oz (129.389 kg)  01/22/13 288 lb (130.636 kg)  01/12/13 284 lb (128.822 kg)    General: Appears her stated age, obese but developed, well nourished in NAD. Cardiovascular: Normal rate and rhythm. S1,S2 noted.  No murmur, rubs or gallops noted. No JVD or BLE edema. No carotid bruits noted. Pulmonary/Chest: Normal effort and positive vesicular breath sounds. No respiratory distress. No wheezes, rales or ronchi noted.  Musculoskeletal: Normal range of motion. Crepitus with ROM of knees. No signs of joint swelling. No difficulty with gait.      Assessment & Plan:   RTC in 1 month to assess weight loss

## 2013-03-12 NOTE — Assessment & Plan Note (Signed)
Continue with your diet and exercise Continue to see nutrition to assess your intake  RTC i n1 month for f/u

## 2013-04-14 ENCOUNTER — Ambulatory Visit: Payer: Medicare Other | Admitting: Internal Medicine

## 2013-04-17 ENCOUNTER — Encounter: Payer: Self-pay | Admitting: Internal Medicine

## 2013-04-17 ENCOUNTER — Ambulatory Visit (INDEPENDENT_AMBULATORY_CARE_PROVIDER_SITE_OTHER): Payer: Medicare PPO | Admitting: Internal Medicine

## 2013-04-17 VITALS — BP 154/88 | HR 86 | Temp 97.0°F | Ht 65.5 in | Wt 286.0 lb

## 2013-04-17 DIAGNOSIS — I10 Essential (primary) hypertension: Secondary | ICD-10-CM

## 2013-04-17 DIAGNOSIS — E669 Obesity, unspecified: Secondary | ICD-10-CM

## 2013-04-17 NOTE — Assessment & Plan Note (Signed)
Not well controlled Discuss changing medications with patient She feels her BP is related to stress she would like to see what it is in 1 month before making any changes

## 2013-04-17 NOTE — Patient Instructions (Signed)

## 2013-04-17 NOTE — Assessment & Plan Note (Signed)
Continue to work on weight loss Encouraged diet and exercise

## 2013-04-17 NOTE — Progress Notes (Signed)
Subjective:    Patient ID: Alicia [name], female    DOB: [birth year], 67 y.o.   MRN: [id number]  HPI  Pt presents to the clinic today f/u with plans about her weight loss surgery. She has completed 3 rd month of the required 6 month wait period before she can proceed with the bariatric surgery. She has lost 3 pounds. . She does take measures to lose weight. She is trying to maintain a 1200 calorie diet. She does walk 1-2.5 miles in the mornings over the course of 30 minutes to 1 hour. She does do this 5 days a week. She is not able to walk at a much faster pace due to the pain from arthritis in the knees. She has been seeing a nutritionist who is helping her make better diet choices. She did previously have her stomach stapled back in 1984.   Review of Systems      Past Medical History  Diagnosis Date  . Diabetes mellitus   . Hypertension   . Hyperlipidemia   . Obesity   . GERD (gastroesophageal reflux disease)   . Anemia   . Blood transfusion without reported diagnosis   . Cataract   . Arthritis     Current Outpatient Prescriptions  Medication Sig Dispense Refill  . doxazosin (CARDURA) 4 MG tablet Take 1 tablet (4 mg total) by mouth at bedtime.  30 tablet  1  . losartan (COZAAR) 100 MG tablet Take 1 tablet (100 mg total) by mouth daily.  30 tablet  11  . pantoprazole (PROTONIX) 40 MG tablet Take 1 tablet (40 mg total) by mouth daily.  30 tablet  3  . pioglitazone-metformin (ACTOPLUS MET) 15-850 MG per tablet Take 1 tablet by mouth 2 (two) times daily with a meal.  60 tablet  11  . pravastatin (PRAVACHOL) 20 MG tablet Take 1 tablet (20 mg total) by mouth daily.  30 tablet  11  . sitaGLIPtin (JANUVIA) 100 MG tablet Take 1 tablet (100 mg total) by mouth daily.  30 tablet  11  . spironolactone (ALDACTONE) 25 MG tablet Take 1 tablet (25 mg total) by mouth daily.  30 tablet  11  . traMADol (ULTRAM) 50 MG tablet Take 1-2 tablets (50-100 mg total) by mouth every 6 (six) hours as needed for  pain.  45 tablet  0   No current facility-administered medications for this visit.    Allergies  Allergen Reactions  . Iodinated Diagnostic Agents   . Sulfa Antibiotics     Family History  Problem Relation Age of Onset  . Stomach cancer Sister   . Diabetes Sister   . Diabetes Mother   . Heart attack Mother   . Hypertension Mother   . Hyperlipidemia Mother   . Diabetes Sister   . Heart attack Father   . Stroke Father   . Hypertension Father   . Hyperlipidemia Father   . Diabetes Sister     History   Social History  . Marital Status: Divorced    Spouse Name: N/A    Number of Children: 3  . Years of Education: doctorate   Occupational History  . Teacher    Social History Main Topics  . Smoking status: Never Smoker   . Smokeless tobacco: Never Used  . Alcohol Use: No  . Drug Use: No  . Sexually Active: Not on file   Other Topics Concern  . Not on file   Social History Narrative   Caffeine use-yes  Regular exercise-no     Constitutional: Denies fever, malaise, fatigue, headache or abrupt weight changes.  Respiratory: Denies difficulty breathing, shortness of breath, cough or sputum production.   Cardiovascular: Denies chest pain, chest tightness, palpitations or swelling in the hands or feet.  Musculoskeletal: Pt reports arthritis in bilateral knees. Denies decrease in range of motion, difficulty with gait, muscle pain or joint swelling.    No other specific complaints in a complete review of systems (except as listed in HPI above).  Objective:   Physical Exam  154/88, 97.0, 86, 99% weight 286lb Wt Readings from Last 3 Encounters:  03/12/13 285 lb 4 oz (129.389 kg)  01/22/13 288 lb (130.636 kg)  01/12/13 284 lb (128.822 kg)    General: Appears her stated age, obese but developed, well nourished in NAD. Cardiovascular: Normal rate and rhythm. S1,S2 noted.  No murmur, rubs or gallops noted. No JVD or BLE edema. No carotid bruits  noted. Pulmonary/Chest: Normal effort and positive vesicular breath sounds. No respiratory distress. No wheezes, rales or ronchi noted.  Musculoskeletal: Normal range of motion. Crepitus with ROM of knees. No signs of joint swelling. No difficulty with gait.      Assessment & Plan:   RTC in 1 month to assess weight loss

## 2013-05-12 ENCOUNTER — Encounter: Payer: Self-pay | Admitting: Internal Medicine

## 2013-05-12 ENCOUNTER — Ambulatory Visit (INDEPENDENT_AMBULATORY_CARE_PROVIDER_SITE_OTHER): Payer: Medicare PPO | Admitting: Internal Medicine

## 2013-05-12 MED ORDER — DOXAZOSIN MESYLATE 4 MG PO TABS: 4.0000 mg | ORAL_TABLET | Freq: Every day | ORAL | Status: DC

## 2013-05-12 NOTE — Progress Notes (Signed)
Subjective:    Patient ID: Alicia [name], female    DOB: [birth year], 67 y.o.   MRN: [id number]  HPI  Pt presents to the clinic today f/u with plans about her weight loss surgery. She has completed 5th month of the required 6 month wait period before she can proceed with the bariatric surgery. She has lost 4 pounds . She does take measures to lose weight. She is trying to maintain a 1200 calorie diet. She does walk 3 miles in the mornings over the course of 2 hours. She does do this 5 days a week. She is not able to walk at a much faster pace due to the pain from arthritis in the knees. She has been seeing a nutritionist who is helping her make better diet choices. She did previously have her stomach stapled back in 1984.   Review of Systems      Past Medical History  Diagnosis Date  . Diabetes mellitus   . Hypertension   . Hyperlipidemia   . Obesity   . GERD (gastroesophageal reflux disease)   . Anemia   . Blood transfusion without reported diagnosis   . Cataract   . Arthritis     Current Outpatient Prescriptions  Medication Sig Dispense Refill  . doxazosin (CARDURA) 4 MG tablet Take 1 tablet (4 mg total) by mouth at bedtime.  30 tablet  6  . losartan (COZAAR) 100 MG tablet Take 1 tablet (100 mg total) by mouth daily.  30 tablet  11  . pantoprazole (PROTONIX) 40 MG tablet Take 1 tablet (40 mg total) by mouth daily.  30 tablet  3  . pioglitazone-metformin (ACTOPLUS MET) 15-850 MG per tablet Take 1 tablet by mouth 2 (two) times daily with a meal.  60 tablet  11  . pravastatin (PRAVACHOL) 20 MG tablet Take 1 tablet (20 mg total) by mouth daily.  30 tablet  11  . sitaGLIPtin (JANUVIA) 100 MG tablet Take 1 tablet (100 mg total) by mouth daily.  30 tablet  11  . spironolactone (ALDACTONE) 25 MG tablet Take 1 tablet (25 mg total) by mouth daily.  30 tablet  11  . traMADol (ULTRAM) 50 MG tablet Take 1-2 tablets (50-100 mg total) by mouth every 6 (six) hours as needed for pain.  45 tablet   0   No current facility-administered medications for this visit.    Allergies  Allergen Reactions  . Iodinated Diagnostic Agents   . Sulfa Antibiotics     Family History  Problem Relation Age of Onset  . Stomach cancer Sister   . Diabetes Sister   . Diabetes Mother   . Heart attack Mother   . Hypertension Mother   . Hyperlipidemia Mother   . Diabetes Sister   . Heart attack Father   . Stroke Father   . Hypertension Father   . Hyperlipidemia Father   . Diabetes Sister     History   Social History  . Marital Status: Divorced    Spouse Name: N/A    Number of Children: 3  . Years of Education: doctorate   Occupational History  . Teacher    Social History Main Topics  . Smoking status: Never Smoker   . Smokeless tobacco: Never Used  . Alcohol Use: No  . Drug Use: No  . Sexually Active: Not on file   Other Topics Concern  . Not on file   Social History Narrative   Caffeine use-yes   Regular exercise-no  Constitutional: Denies fever, malaise, fatigue, headache or abrupt weight changes.  Respiratory: Denies difficulty breathing, shortness of breath, cough or sputum production.   Cardiovascular: Denies chest pain, chest tightness, palpitations or swelling in the hands or feet.  Musculoskeletal: Pt reports arthritis in bilateral knees. Denies decrease in range of motion, difficulty with gait, muscle pain or joint swelling.    No other specific complaints in a complete review of systems (except as listed in HPI above).  Objective:   Physical Exam  154/88, 97.0, 86, 99% weight 286lb Wt Readings from Last 3 Encounters:  05/12/13 285 lb (129.275 kg)  04/17/13 286 lb (129.729 kg)  03/12/13 285 lb 4 oz (129.389 kg)    General: Appears her stated age, obese but developed, well nourished in NAD. Cardiovascular: Normal rate and rhythm. S1,S2 noted.  No murmur, rubs or gallops noted. No JVD or BLE edema. No carotid bruits noted. Pulmonary/Chest: Normal effort  and positive vesicular breath sounds. No respiratory distress. No wheezes, rales or ronchi noted.  Musculoskeletal: Normal range of motion. Crepitus with ROM of knees. No signs of joint swelling. No difficulty with gait.      Assessment & Plan:   RTC in 1 month to assess weight loss

## 2013-05-12 NOTE — Patient Instructions (Signed)
Calorie Counting Diet A calorie counting diet requires you to eat the number of calories that are right for you in a day. Calories are the measurement of how much energy you get from the food you eat. Eating the right amount of calories is important for staying at a healthy weight. If you eat too many calories, your body will store them as fat and you may gain weight. If you eat too few calories, you may lose weight. Counting the number of calories you eat during a day will help you know if you are eating the right amount. A Registered Dietitian can determine how many calories you need in a day. The amount of calories needed varies from person to person. If your goal is to lose weight, you will need to eat fewer calories. Losing weight can benefit you if you are overweight or have health problems such as heart disease, high blood pressure, or diabetes. If your goal is to gain weight, you will need to eat more calories. Gaining weight may be necessary if you have a certain health problem that causes your body to need more energy. TIPS Whether you are increasing or decreasing the number of calories you eat during a day, it may be hard to get used to changes in what you eat and drink. The following are tips to help you keep track of the number of calories you eat.  Measure foods at home with measuring cups. This helps you know the amount of food and number of calories you are eating.  Restaurants often serve food in amounts that are larger than 1 serving. While eating out, estimate how many servings of a food you are given. For example, a serving of cooked rice is  cup or about the size of half of a fist. Knowing serving sizes will help you be aware of how much food you are eating at restaurants.  Ask for smaller portion sizes or child-size portions at restaurants.  Plan to eat half of a meal at a restaurant. Take the rest home or share the other half with a friend.  Read the Nutrition Facts panel on  food labels for calorie content and serving size. You can find out how many servings are in a package, the size of a serving, and the number of calories each serving has.  For example, a package might contain 3 cookies. The Nutrition Facts panel on that package says that 1 serving is 1 cookie. Below that, it will say there are 3 servings in the container. The calories section of the Nutrition Facts label says there are 90 calories. This means there are 90 calories in 1 cookie (1 serving). If you eat 1 cookie you have eaten 90 calories. If you eat all 3 cookies, you have eaten 270 calories (3 servings x 90 calories = 270 calories). The list below tells you how big or small some common portion sizes are.  1 oz.........4 stacked dice.  3 oz.........Deck of cards.  1 tsp........Tip of little finger.  1 tbs........Thumb.  2 tbs........Golf ball.   cup.......Half of a fist.  1 cup........A fist. KEEP A FOOD LOG Write down every food item you eat, the amount you eat, and the number of calories in each food you eat during the day. At the end of the day, you can add up the total number of calories you have eaten. It may help to keep a list like the one below. Find out the calorie information by reading the   Nutrition Facts panel on food labels. Breakfast  Bran cereal (1 cup, 110 calories).  Fat-free milk ( cup, 45 calories). Snack  Apple (1 medium, 80 calories). Lunch  Spinach (1 cup, 20 calories).  Tomato ( medium, 20 calories).  Chicken breast strips (3 oz, 165 calories).  Shredded cheddar cheese ( cup, 110 calories).  Light Italian dressing (2 tbs, 60 calories).  Whole-wheat bread (1 slice, 80 calories).  Tub margarine (1 tsp, 35 calories).  Vegetable soup (1 cup, 160 calories). Dinner  Pork chop (3 oz, 190 calories).  Brown rice (1 cup, 215 calories).  Steamed broccoli ( cup, 20 calories).  Strawberries (1  cup, 65 calories).  Whipped cream (1 tbs, 50  calories). Daily Calorie Total: 1425 Document Released: 11/05/2005 Document Revised: 01/28/2012 Document Reviewed: 05/02/2007 [employer] Patient Information 2014 [employer].  

## 2013-06-11 ENCOUNTER — Ambulatory Visit (INDEPENDENT_AMBULATORY_CARE_PROVIDER_SITE_OTHER): Payer: Medicare PPO | Admitting: Internal Medicine

## 2013-06-11 ENCOUNTER — Encounter: Payer: Self-pay | Admitting: Internal Medicine

## 2013-06-11 VITALS — BP 138/86 | HR 79 | Temp 98.7°F | Wt 289.0 lb

## 2013-06-11 DIAGNOSIS — E669 Obesity, unspecified: Secondary | ICD-10-CM

## 2013-06-11 NOTE — Patient Instructions (Signed)
Bariatric Surgery (Gastrointestinal Surgery for Severe Obesity) Severe obesity is a longstanding condition. It is difficult to treat through diet and exercise alone. Gastrointestinal surgery is the best option for people who are severely obese and cannot lose weight by traditional means, or who suffer from serious obesity-related health problems. The surgery promotes weight loss by decreasing the absorption of food and, in some operations, interrupting the digestive process. As in other treatments for obesity, the best results are achieved with healthy eating behaviors and regular physical activity.  People who may consider gastrointestinal surgery include those with a body mass index (BMI) above 40. This is about 100 pounds of overweight for men and 80 pounds for women. People with a BMI between 35 and 40 and who suffer from type 2 diabetes or life-threatening cardiopulmonary (heart and lung) problems, such as severe sleep apnea or obesity-related heart disease, may also be candidates for surgery. (To use the Body Mass Index chart. find your weight on the bottom of the graph. Go straight up from that point until you come to the line that matches your height. Then look to find your weight group). The idea of gastrointestinal surgery to control obesity grew out of results of operations for cancer or severe ulcers that removed large portions of the stomach or small intestine. Patients undergoing these procedures tended to lose weight after surgery. So some physicians began to use such operations to treat severe obesity. The first operation that was widely used for severe obesity was the intestinal bypass. This operation was first used 40 years ago. It produced weight loss by causing malabsorption. The idea was that patients could eat large amounts of food, which would be poorly digested or passed along too fast for the body to absorb many calories. The problem with this surgery was that it caused a loss of  essential nutrients. Also, its side effects were unpredictable and sometimes fatal. The original form of the intestinal bypass operation is no longer used. THE NORMAL DIGESTIVE PROCESS Normally, as food moves along the digestive tract, digestive juices and enzymes digest and absorb calories and nutrients. After we chew and swallow our food, it moves down the esophagus to the stomach. There a strong acid continues the digestive process. The stomach can hold about 3 pints of food at one time. When the stomach contents move to the first portion of the small intestine (duodenum ), bile and pancreatic juice speed up digestion. Most of the iron and calcium in the foods we eat is absorbed in the duodenum. The jejunum and ileum are the remaining two segments of the nearly 20 feet of small intestine. They complete the absorption of almost all calories and nutrients. The food particles that cannot be digested in the small intestine are stored in the large intestine until eliminated.  HOW DOES SURGERY PROMOTE WEIGHT LOSS? Gastrointestinal surgery for obesity is also called bariatric surgery. It alters the digestive process. The operations promote weight loss by closing off parts of the stomach. This will make it smaller. Operations that only reduce stomach size are known as "restrictive operations". They restrict the amount of food the stomach can hold. Some operations combine stomach restriction with a partial bypass of the small intestine. These procedures create a direct connection from the stomach to the lower segment of the small intestine. This causes bypassing portions of the digestive tract that absorb calories and nutrients. These are known as malabsorptive operations. WHAT ARE THE SURGICAL OPTIONS? There are several types of restrictive and  malabsorptive operations. Each one carries its own benefits and risks.  Restrictive Operations  Restrictive operations serve only to restrict food intake. They do not  interfere with the normal digestive process. To perform the surgery, doctors create a small pouch at the top of the stomach where food enters from the esophagus. At first, the pouch holds about 1 ounce of food. It later expands to 2-3 ounces. The lower outlet of the pouch usually has a diameter of only about  inch. This small outlet delays the emptying of food from the pouch and causes a feeling of fullness. As a result of this surgery, most people lose the ability to eat large amounts of food at one time. After an operation, the person usually can eat only  to 1 cup of food without discomfort or nausea. Also, food has to be well chewed. Restrictive operations for obesity include adjustable gastric banding (AGB) and vertical banded gastroplasty (VBG).  Adjustable gastric banding  In this procedure, a hollow band made of special material is placed around the stomach near its upper end. This creates a small pouch and a narrow passage into the larger remainder of the stomach. The band is then inflated with a salt solution. It can be tightened or loosened over time to change the size of the passage by increasing or decreasing the amount of salt solution.  The band is adjusted based on feelings of hunger and weight loss. Patients decide when they need an adjustment and come to their surgeons to evaluate this. The adjustment is done as an office visit. The band is fully reversible with a second surgery if the patient changes his/her mind. There is no cutting or re-routing of the intestine.  Vertical banded gastroplasty  VBG has been the most common restrictive operation for weight control. Both a band and staples are used to create a small stomach pouch. Vertical banded gastroplasty is based on the same principle of restriction as the band. But the stomach is surgically altered with the stapling. This treatment is not reversible.  Restrictive operations lead to weight loss in almost all patients. But they are  less successful than malabsorptive operations in achieving substantial, long-term weight loss. About 30 percent of those who undergo VBG achieve normal weight. About 80 percent achieve some degree of weight loss. Some patients regain weight. Others are unable to adjust their eating habits and fail to lose the desired weight. Successful results depend on the patient's willingness to adopt a long-term plan of healthy eating and regular physical activity.  A common risk of restrictive operations is vomiting. This is caused when the small stomach is overly stretched by food particles that have not been chewed well. Band slippage and saline leakage have been reported after AGB. Risks of VBG include wearing away of the band and breakdown of the staple line. In a small number of cases, stomach juices may leak into the abdomen. This requires an emergency operation. In less than 1 percent of all cases, infection or death from complications may occur. Malabsorptive Operations  Malabsorptive operations are the most common gastrointestinal surgeries for weight loss. They restrict both food intake and the amount of calories and nutrients the body absorbs.  Roux-en-Y gastric bypass (RGB)  This operation is the most common and successful malabsorptive surgery. First, a small stomach pouch is created to restrict food intake. Next, a Y-shaped section of the small intestine is attached to the pouch. This allows food to bypass the lower stomach, the first  segment of the small intestine (duodenum), and the first portion of the jejunum (the second segment of the small intestine). This bypass reduces the amount of calories and nutrients the body absorbs.  Biliopancreatic diversion (BPD)  In this more complicated malabsorptive operation, portions of the stomach are removed. The small pouch that remains is connected directly to the final segment of the small intestine, completely bypassing the duodenum and the jejunum. This  procedure successfully promotes weight loss. But it is less frequently used than other types of surgery because of the high risk for nutritional deficiencies. A variation of BPD includes a "duodenal switch". This leaves a larger portion of the stomach intact, including the pyloric valve. This valve regulates the release of stomach contents into the small intestine. It also keeps a small part of the duodenum in the digestive pathway.  Malabsorptive operations produce more weight loss than restrictive operations. And they are more effective in reversing the health problems associated with severe obesity. Patients who have malabsorptive operations generally lose two-thirds of their excess weight within 2 years.  In addition to the risks of restrictive surgeries, malabsorptive operations also carry greater risk for nutritional deficiencies. This is because the procedure causes food to bypass the duodenum and jejunum. That is where most iron and calcium are absorbed. Menstruating women may develop anemia because not enough vitamin B12 and iron are absorbed. Decreased absorption of calcium may also bring on osteoporosis and metabolic bone disease. Patients are required to take nutritional supplements that usually prevent these deficiencies. Patients who have the biliopancreatic diversion surgery must also take fat-soluble (dissolved by fat) vitamins A, D, E, and K supplements.  RGB and BPD operations may also cause "dumping syndrome". This means that stomach contents move too rapidly through the small intestine. Symptoms include nausea, weakness, sweating, faintness, and sometimes diarrhea after eating. The duodenal switch operation keeps the pyloric valve intact. So it may reduce the likelihood of dumping syndrome.  The more extensive the bypass, the greater the risk is for complications and nutritional deficiencies. Patients with extensive bypasses of the normal digestive process require close monitoring. They  also need life-long use of special foods, supplements, and medications. EXPLORE BENEFITS AND RISKS Surgery to produce weight loss is a serious undertaking. Anyone thinking about surgery should understand what the operation involves. Patients and physicians should carefully consider the following benefits and risks.  Benefits  Right after surgery, most patients lose weight quickly. They continue to lose for 18 to 24 months after the procedure. Most patients regain 5 to 10 percent of the weight they lost. But many maintain a long-term weight loss of about 100 pounds.  Surgery improves most obesity-related conditions. For example, in one study blood sugar levels of 83 percent of obese patients with diabetes returned to normal after surgery. Nearly all patients whose blood sugar levels did not return to normal were older. Or they had lived with diabetes for a long time. Risks  Ten to 20 percent of patients who have weight-loss surgery require follow-up operations to correct complications. Abdominal hernia was the most common complication requiring follow-up surgery. But laparoscopic techniques seem to have solved this problem. In laparoscopy, the surgeon makes one or more small incisions. Slender surgical instruments are passed them. This technique eliminates the need for a large incision. And it creates less tissue damage. Patients who are super obese (greater than 350 pounds) or have had previous abdominal surgery, may not be good candidates for laparoscopy. Less common complications include breakdown  of the staple line and stretched stomach outlets.  Some obese patients who have weight-loss surgery develop gallstones. These are clumps of cholesterol and other matter that form in the gallbladder. During quick or substantial weight loss, one's risk of developing gallstones increases. Taking supplemental bile salts for the first 6 months after surgery can prevent them.  Nearly 30 percent of patients who  have weight-loss surgery develop nutritional deficiencies. These include anemia, osteoporosis, and metabolic bone disease. These usually can be avoided if vitamin and mineral intakes are high enough.  Women of childbearing age should avoid pregnancy until their weight becomes stable. Quick weight loss and nutritional deficiencies can harm a growing fetus.  Other risks of restrictive surgeries include:  Band slippage.  Stomach prolapse.  Band erosion into the lumen of the stomach.  Port infection.  The main risk with malabsorption operations is life threatening. It is the risk of leak from any of the anastomosis. The more involved the operation, the more risk involved.  There is one other risk of having the surgery. If people do not follow a strict diet, they will stretch out their stomach pouches. Then they will not lose weight. MEDICAL COSTS Gastrointestinal surgery costs vary. They depend on the procedure. Medical insurance coverage varies by state and insurance provider. If you are considering gastrointestinal surgery, contact your r egional Medicare or Medicaid office or your insurance plan. Find out from them if the procedure is covered. IS THE SURGERY FOR YOU?  Gastrointestinal surgery may be the next step for people who remain severely obese after trying nonsurgical approaches or have an obesity-related disease. Candidates for surgery have:  A BMI of 40 or more.  A BMI of 35 or more and a life-threatening obesity-related health problem such as:  Diabetes.  Severe sleep apnea.  Heart disease.  Obesity-related physical problems that interfere with:  Employment.  Walking.  Family function. If you fit the profile for surgery, answers to these questions may help you decide whether weight-loss surgery is appropriate for you. Are you:  Unlikely to lose weight successfully without surgery?  Well informed about the surgical procedure? The effects of treatment?  Determined  to lose weight? Improve your health?  Aware of how your life may change after the operation? Adjustment to the side effects of the surgery include the need to chew well and being unable to eat large meals.  Aware of the potential for serious complications? Dietary restrictions? Occasional failures?  Committed to lifelong medical follow-up?  Restrictive operations are very successful with patients who follow a diet created by a dietician. Support groups and follow up with caregivers is important. Remember: There are no guarantees for any method to produce and maintain weight loss. This includes surgery. Success is possible only with:  Maximum cooperation.  Commitment to behavioral change.  Medical follow-up. This cooperation and commitment must be carried out for the rest of your life.  ADDITIONAL RESOURCES [employer] [place], [id number] [place] [address] www.asmbs.org  Weight[employer]control Information Network (WIN) [place] [address] [email or web address] Document Released: 11/05/2005 Document Revised: 01/28/2012 Document Reviewed: 01/29/2007 [facility] Patient Information 2014 [place], [place].

## 2013-06-11 NOTE — Assessment & Plan Note (Signed)
Gained 4 pounds despite diet and exercise Proceed with bypass surgery

## 2013-06-11 NOTE — Progress Notes (Signed)
Subjective:    Patient ID: Alicia [name], female    DOB: 08-Jan-1946, 67 y.o.   MRN: [id number]  HPI  Pt presents to the clinic today f/u with plans about her weight loss surgery. She has completed 6th month of the required 6 month wait period before she can proceed with the bariatric surgery. She has gained 4 pounds . She does take measures to lose weight. She is trying to maintain a 1200 calorie diet. She does walk 3 miles in the mornings over the course of 2 hours. She does do this 5 days a week. She is not able to walk at a much faster pace due to the pain from arthritis in the knees. She has been seeing a nutritionist who is helping her make better diet choices. She did previously have her stomach stapled back in 1984.   Review of Systems      Past Medical History  Diagnosis Date  . Diabetes mellitus   . Hypertension   . Hyperlipidemia   . Obesity   . GERD (gastroesophageal reflux disease)   . Anemia   . Blood transfusion without reported diagnosis   . Cataract   . Arthritis     Current Outpatient Prescriptions  Medication Sig Dispense Refill  . aspirin 81 MG tablet Take 81 mg by mouth daily.      [name] doxazosin (CARDURA) 4 MG tablet Take 1 tablet (4 mg total) by mouth at bedtime.  30 tablet  6  . losartan (COZAAR) 100 MG tablet Take 1 tablet (100 mg total) by mouth daily.  30 tablet  11  . pantoprazole (PROTONIX) 40 MG tablet Take 1 tablet (40 mg total) by mouth daily.  30 tablet  3  . pioglitazone-metformin (ACTOPLUS MET) 15-850 MG per tablet Take 1 tablet by mouth 2 (two) times daily with a meal.  60 tablet  11  . pravastatin (PRAVACHOL) 20 MG tablet Take 1 tablet (20 mg total) by mouth daily.  30 tablet  11  . sitaGLIPtin (JANUVIA) 100 MG tablet Take 1 tablet (100 mg total) by mouth daily.  30 tablet  11  . spironolactone (ALDACTONE) 25 MG tablet Take 1 tablet (25 mg total) by mouth daily.  30 tablet  11  . traMADol (ULTRAM) 50 MG tablet Take 1-2 tablets (50-100 mg total) by  mouth every 6 (six) hours as needed for pain.  45 tablet  0   No current facility-administered medications for this visit.    Allergies  Allergen Reactions  . Iodinated Diagnostic Agents   . Sulfa Antibiotics     Family History  Problem Relation Age of Onset  . Stomach cancer Sister   . Diabetes Sister   . Diabetes Mother   . Heart attack Mother   . Hypertension Mother   . Hyperlipidemia Mother   . Diabetes Sister   . Heart attack Father   . Stroke Father   . Hypertension Father   . Hyperlipidemia Father   . Diabetes Sister     History   Social History  . Marital Status: Divorced    Spouse Name: N/A    Number of Children: 3  . Years of Education: doctorate   Occupational History  . Teacher    Social History Main Topics  . Smoking status: Never Smoker   . Smokeless tobacco: Never Used  . Alcohol Use: No  . Drug Use: No  . Sexually Active: Not on file   Other Topics Concern  .  Not on file   Social History Narrative   Caffeine use-yes   Regular exercise-no     Constitutional: Denies fever, malaise, fatigue, headache or abrupt weight changes.  Respiratory: Denies difficulty breathing, shortness of breath, cough or sputum production.   Cardiovascular: Denies chest pain, chest tightness, palpitations or swelling in the hands or feet.  Musculoskeletal: Pt reports arthritis in bilateral knees. Denies decrease in range of motion, difficulty with gait, muscle pain or joint swelling.    No other specific complaints in a complete review of systems (except as listed in HPI above).  Objective:   Physical Exam  154/88, 97.0, 86, 99% weight 286lb Wt Readings from Last 3 Encounters:  06/11/13 289 lb (131.09 kg)  05/12/13 285 lb (129.275 kg)  04/17/13 286 lb (129.729 kg)    General: Appears her stated age, obese but developed, well nourished in NAD. Cardiovascular: Normal rate and rhythm. S1,S2 noted.  No murmur, rubs or gallops noted. No JVD or BLE edema. No  carotid bruits noted. Pulmonary/Chest: Normal effort and positive vesicular breath sounds. No respiratory distress. No wheezes, rales or ronchi noted.  Musculoskeletal: Normal range of motion. Crepitus with ROM of knees. No signs of joint swelling. No difficulty with gait.      Assessment & Plan:   Will fill out forms to continue with bariatric surgery

## 2013-07-09 ENCOUNTER — Ambulatory Visit (INDEPENDENT_AMBULATORY_CARE_PROVIDER_SITE_OTHER): Payer: Self-pay | Admitting: General Surgery

## 2013-07-24 ENCOUNTER — Telehealth: Payer: Self-pay | Admitting: Internal Medicine

## 2013-07-24 NOTE — Telephone Encounter (Signed)
Rec'd from [facility] forward 1 page to [facility]

## 2013-08-22 ENCOUNTER — Other Ambulatory Visit: Payer: Self-pay | Admitting: Internal Medicine

## 2013-09-25 ENCOUNTER — Encounter: Payer: Self-pay | Admitting: Internal Medicine

## 2013-12-05 ENCOUNTER — Other Ambulatory Visit: Payer: Self-pay | Admitting: Internal Medicine

## 2013-12-11 ENCOUNTER — Other Ambulatory Visit: Payer: Self-pay | Admitting: Internal Medicine

## 2013-12-14 ENCOUNTER — Other Ambulatory Visit: Payer: Self-pay | Admitting: Internal Medicine

## 2014-01-10 ENCOUNTER — Other Ambulatory Visit: Payer: Self-pay | Admitting: Internal Medicine

## 2014-01-18 ENCOUNTER — Other Ambulatory Visit: Payer: Self-pay | Admitting: Internal Medicine

## 2014-01-29 ENCOUNTER — Encounter: Payer: Self-pay | Admitting: Internal Medicine

## 2014-01-29 ENCOUNTER — Ambulatory Visit (INDEPENDENT_AMBULATORY_CARE_PROVIDER_SITE_OTHER): Payer: Medicare Other | Admitting: Internal Medicine

## 2014-01-29 VITALS — BP 146/102 | HR 71 | Temp 98.0°F | Wt 288.5 lb

## 2014-01-29 DIAGNOSIS — I10 Essential (primary) hypertension: Secondary | ICD-10-CM

## 2014-01-29 DIAGNOSIS — E119 Type 2 diabetes mellitus without complications: Secondary | ICD-10-CM | POA: Diagnosis not present

## 2014-01-29 DIAGNOSIS — K219 Gastro-esophageal reflux disease without esophagitis: Secondary | ICD-10-CM | POA: Diagnosis not present

## 2014-01-29 DIAGNOSIS — E669 Obesity, unspecified: Secondary | ICD-10-CM

## 2014-01-29 DIAGNOSIS — E785 Hyperlipidemia, unspecified: Secondary | ICD-10-CM

## 2014-01-29 LAB — CBC
HEMATOCRIT: 32.5 % — AB (ref 36.0–46.0)
Hemoglobin: 10.1 g/dL — ABNORMAL LOW (ref 12.0–15.0)
MCHC: 31.1 g/dL (ref 30.0–36.0)
MCV: 70.3 fl — ABNORMAL LOW (ref 78.0–100.0)
Platelets: 216 10*3/uL (ref 150.0–400.0)
RBC: 4.62 Mil/uL (ref 3.87–5.11)
RDW: 16.3 % — AB (ref 11.5–14.6)
WBC: 4.7 10*3/uL (ref 4.5–10.5)

## 2014-01-29 LAB — COMPREHENSIVE METABOLIC PANEL
ALBUMIN: 3.9 g/dL (ref 3.5–5.2)
ALT: 14 U/L (ref 0–35)
AST: 20 U/L (ref 0–37)
Alkaline Phosphatase: 58 U/L (ref 39–117)
BILIRUBIN TOTAL: 0.6 mg/dL (ref 0.3–1.2)
BUN: 23 mg/dL (ref 6–23)
CO2: 29 mEq/L (ref 19–32)
Calcium: 9.2 mg/dL (ref 8.4–10.5)
Chloride: 105 mEq/L (ref 96–112)
Creatinine, Ser: 1.1 mg/dL (ref 0.4–1.2)
GFR: 63.18 mL/min (ref >60.00)
GLUCOSE: 89 mg/dL (ref 70–99)
Potassium: 4.2 mEq/L (ref 3.5–5.1)
SODIUM: 139 meq/L (ref 135–145)
TOTAL PROTEIN: 6.8 g/dL (ref 6.0–8.3)

## 2014-01-29 LAB — HEMOGLOBIN A1C: HEMOGLOBIN A1C: 6 % (ref 4.6–6.5)

## 2014-01-29 LAB — LIPID PANEL
CHOL/HDL RATIO: 4
Cholesterol: 232 mg/dL — ABNORMAL HIGH (ref 0–200)
HDL: 64.8 mg/dL (ref >39.00)
LDL Cholesterol: 155 mg/dL — ABNORMAL HIGH (ref 0–99)
Triglycerides: 60 mg/dL (ref 0.0–149.0)
VLDL: 12 mg/dL (ref 0.0–40.0)

## 2014-01-29 LAB — MICROALBUMIN / CREATININE URINE RATIO
Creatinine,U: 156.7 mg/dL
MICROALB UR: 0.5 mg/dL (ref 0.0–1.9)
Microalb Creat Ratio: 0.3 mg/g (ref 0.0–30.0)

## 2014-01-29 LAB — TSH: TSH: 0.71 u[IU]/mL (ref 0.35–5.50)

## 2014-01-29 MED ORDER — DOXAZOSIN MESYLATE 4 MG PO TABS: 4.0000 mg | ORAL_TABLET | Freq: Every day | ORAL | Status: DC

## 2014-01-29 MED ORDER — LOSARTAN POTASSIUM 100 MG PO TABS: 100.0000 mg | ORAL_TABLET | Freq: Every day | ORAL | Status: DC

## 2014-01-29 MED ORDER — INSULIN PEN NEEDLE 31G X 5 MM MISC: 1.0000 | Freq: Every day | Status: DC | PRN

## 2014-01-29 MED ORDER — PIOGLITAZONE HCL-METFORMIN HCL 15-850 MG PO TABS: 1.0000 | ORAL_TABLET | Freq: Two times a day (BID) | ORAL | Status: DC

## 2014-01-29 MED ORDER — LIRAGLUTIDE 18 MG/3ML ~~LOC~~ SOPN: PEN_INJECTOR | SUBCUTANEOUS | Status: DC

## 2014-01-29 MED ORDER — LIRAGLUTIDE 18 MG/3ML ~~LOC~~ SOPN: 0.6000 mL | PEN_INJECTOR | Freq: Every day | SUBCUTANEOUS | Status: DC

## 2014-01-29 MED ORDER — PRAVASTATIN SODIUM 20 MG PO TABS: 20.0000 mg | ORAL_TABLET | Freq: Every day | ORAL | Status: DC

## 2014-01-29 MED ORDER — SITAGLIPTIN PHOSPHATE 100 MG PO TABS: 100.0000 mg | ORAL_TABLET | Freq: Every day | ORAL | Status: DC

## 2014-01-29 MED ORDER — SPIRONOLACTONE 25 MG PO TABS: 25.0000 mg | ORAL_TABLET | Freq: Every day | ORAL | Status: DC

## 2014-01-29 MED ORDER — PANTOPRAZOLE SODIUM 40 MG PO TBEC: 40.0000 mg | DELAYED_RELEASE_TABLET | Freq: Every day | ORAL | Status: DC

## 2014-01-29 NOTE — Assessment & Plan Note (Signed)
Well controlled on protonix No changes needed at this time

## 2014-01-29 NOTE — Assessment & Plan Note (Addendum)
Last A1C 6.6% Will recheck A1C today She wants to come off the Januvia because she has heard bad things about it on TV. If A1C looks good, will consider d/c januvia. Will start Victoza in its place. Foot exam today

## 2014-01-29 NOTE — Assessment & Plan Note (Signed)
Was supposed to get gastric bypass but they were not able to approve her because she has medicare Continue to work on diet and exercise

## 2014-01-29 NOTE — Patient Instructions (Addendum)

## 2014-01-29 NOTE — Assessment & Plan Note (Signed)
Elevated today, but she reports she was rushing trying to get here.  Will check CBC and CMET today  Medications refilled per pt request

## 2014-01-29 NOTE — Assessment & Plan Note (Signed)
On pravastatin Will reckeck lipid profile and CMET today

## 2014-01-29 NOTE — Progress Notes (Signed)
Pre visit review using our clinic review tool, if applicable. No additional management support is needed unless otherwise documented below in the visit note. 

## 2014-01-29 NOTE — Addendum Note (Signed)
Addended by: Lurlean Nanny on: 01/29/2014 11:14 AM   Modules accepted: Orders

## 2014-01-29 NOTE — Progress Notes (Signed)
Subjective:    Patient ID: [name], female    DOB: 1946/08/20, 68 y.o.   MRN: [id number]  HPI  Pt presents to the clinic today to follow up chronic medical conditions.  HTN: No issues on cardra, cozaar and aldactone.  GERD: Well controlled on pantoprazole.  DM 2: Last A1C 6.6% 09/2012. On januvia, actoplus met.  HLD: On pravastatin. Last lipid profile 09/2012.  Obesity: s/p gastric bypass. Was being reevaluated for gastric sleeve by bariatric surgery.  Review of Systems      Past Medical History  Diagnosis Date  . Diabetes mellitus   . Hypertension   . Hyperlipidemia   . Obesity   . GERD (gastroesophageal reflux disease)   . Anemia   . Blood transfusion without reported diagnosis   . Cataract   . Arthritis     Current Outpatient Prescriptions  Medication Sig Dispense Refill  . aspirin 81 MG tablet Take 81 mg by mouth daily.      [name] doxazosin (CARDURA) 4 MG tablet Take 1 tablet (4 mg total) by mouth at bedtime.  30 tablet  6  . JANUVIA 100 MG tablet TAKE 1 TABLET (100 MG TOTAL) BY MOUTH DAILY.  30 tablet  0  . losartan (COZAAR) 100 MG tablet Take 1 tablet (100 mg total) by mouth daily.  30 tablet  0  . pantoprazole (PROTONIX) 40 MG tablet TAKE 1 TABLET (40 MG TOTAL) BY MOUTH DAILY.  30 tablet  3  . pioglitazone-metformin (ACTOPLUS MET) 15-850 MG per tablet TAKE 1 TABLET BY MOUTH TWICE A DAY WITH A MEAL  60 tablet  0  . pravastatin (PRAVACHOL) 20 MG tablet Take 1 tablet (20 mg total) by mouth daily.  30 tablet  0  . spironolactone (ALDACTONE) 25 MG tablet Take 1 tablet (25 mg total) by mouth daily.  30 tablet  0  . traMADol (ULTRAM) 50 MG tablet Take 1-2 tablets (50-100 mg total) by mouth every 6 (six) hours as needed for pain.  45 tablet  0   No current facility-administered medications for this visit.    Allergies  Allergen Reactions  . Iodinated Diagnostic Agents   . Sulfa Antibiotics     Family History  Problem Relation Age of Onset  . Stomach cancer  Sister   . Diabetes Sister   . Diabetes Mother   . Heart attack Mother   . Hypertension Mother   . Hyperlipidemia Mother   . Diabetes Sister   . Heart attack Father   . Stroke Father   . Hypertension Father   . Hyperlipidemia Father   . Diabetes Sister     History   Social History  . Marital Status: Divorced    Spouse Name: N/A    Number of Children: 3  . Years of Education: doctorate   Occupational History  . Teacher    Social History Main Topics  . Smoking status: Never Smoker   . Smokeless tobacco: Never Used  . Alcohol Use: No  . Drug Use: No  . Sexual Activity: Not on file   Other Topics Concern  . Not on file   Social History Narrative   Caffeine use-yes   Regular exercise-no     Constitutional: Denies fever, malaise, fatigue, headache or abrupt weight changes.  HEENT: Denies eye pain, eye redness, ear pain, ringing in the ears, wax buildup, runny nose, nasal congestion, bloody nose, or sore throat. Respiratory: Denies difficulty breathing, shortness of breath, cough or sputum  production.   Cardiovascular: Denies chest pain, chest tightness, palpitations or swelling in the hands or feet.  Gastrointestinal: Denies abdominal pain, bloating, constipation, diarrhea or blood in the stool.  GU: Denies urgency, frequency, pain with urination, burning sensation, blood in urine, odor or discharge. Musculoskeletal: Denies decrease in range of motion, difficulty with gait, muscle pain or joint pain and swelling.  Skin: Denies redness, rashes, lesions or ulcercations.  Neurological: Denies dizziness, difficulty with memory, difficulty with speech or problems with balance and coordination.   No other specific complaints in a complete review of systems (except as listed in HPI above).  Objective:   Physical Exam   BP 146/102  Pulse 71  Temp(Src) 98 F (36.7 C) (Oral)  Wt 288 lb 8 oz (130.863 kg)  SpO2 99% Wt Readings from Last 3 Encounters:  01/29/14 288 lb 8  oz (130.863 kg)  06/11/13 289 lb (131.09 kg)  05/12/13 285 lb (129.275 kg)    General: Appears her stated age,obese but  well developed, well nourished in NAD. Cardiovascular: Normal rate and rhythm. S1,S2 noted.  No murmur, rubs or gallops noted. No JVD or BLE edema. No carotid bruits noted. Pulmonary/Chest: Normal effort and positive vesicular breath sounds. No respiratory distress. No wheezes, rales or ronchi noted.  Abdomen: Soft and nontender. Normal bowel sounds, no bruits noted. No distention or masses noted. Liver, spleen and kidneys non palpable. Neurological: Alert and oriented. Cranial nerves II-XII intact. Coordination normal. +DTRs bilaterally.   BMET    Component Value Date/Time   NA 141 09/23/2012 1006   K 4.7 09/23/2012 1006   CL 103 09/23/2012 1006   CO2 28 09/23/2012 1006   GLUCOSE 149* 09/23/2012 1006   BUN 21 09/23/2012 1006   CREATININE 1.3* 09/23/2012 1006   CALCIUM 9.2 09/23/2012 1006   GFRNONAA 55* 10/13/2011 0510   GFRAA 64* 10/13/2011 0510    Lipid Panel     Component Value Date/Time   CHOL 198 10/09/2012 1131   TRIG 62.0 10/09/2012 1131   HDL 48.20 10/09/2012 1131   CHOLHDL 4 10/09/2012 1131   VLDL 12.4 10/09/2012 1131   LDLCALC 137* 10/09/2012 1131    CBC    Component Value Date/Time   WBC 6.6 09/23/2012 1006   RBC 5.00 09/23/2012 1006   RBC 4.55 05/25/2007 1320   HGB 10.8* 09/23/2012 1006   HCT 35.1* 09/23/2012 1006   PLT 233.0 09/23/2012 1006   MCV 70.2* 09/23/2012 1006   MCH 22.2* 10/13/2011 0510   MCHC 30.7 09/23/2012 1006   RDW 16.4* 09/23/2012 1006   LYMPHSABS 1.4 10/12/2011 1625   MONOABS 0.4 10/12/2011 1625   EOSABS 0.1 10/12/2011 1625   BASOSABS 0.0 10/12/2011 1625    Hgb A1C Lab Results  Component Value Date   HGBA1C 6.6* 10/09/2012        Assessment & Plan:

## 2014-02-01 ENCOUNTER — Telehealth: Payer: Self-pay | Admitting: Internal Medicine

## 2014-02-01 NOTE — Telephone Encounter (Signed)
Relevant patient education mailed to patient.  

## 2014-02-04 ENCOUNTER — Ambulatory Visit: Payer: Medicare PPO | Admitting: Physician Assistant

## 2014-02-10 DIAGNOSIS — E1139 Type 2 diabetes mellitus with other diabetic ophthalmic complication: Secondary | ICD-10-CM | POA: Diagnosis not present

## 2014-02-10 DIAGNOSIS — H2510 Age-related nuclear cataract, unspecified eye: Secondary | ICD-10-CM | POA: Diagnosis not present

## 2014-02-10 DIAGNOSIS — E11329 Type 2 diabetes mellitus with mild nonproliferative diabetic retinopathy without macular edema: Secondary | ICD-10-CM | POA: Diagnosis not present

## 2014-02-10 DIAGNOSIS — H35369 Drusen (degenerative) of macula, unspecified eye: Secondary | ICD-10-CM | POA: Diagnosis not present

## 2014-04-02 ENCOUNTER — Telehealth: Payer: Self-pay | Admitting: Family Medicine

## 2014-04-02 NOTE — Telephone Encounter (Signed)
Pt was told by a pharmacist in [place] that she should not take Januvia with Victoza.  She has been getting Januvia since moving to [name] and the pharmacist has not said anything so she wants to know if it's safe to take together.  Cb [id number]

## 2014-04-05 NOTE — Telephone Encounter (Signed)
Pt is aware as instructed 

## 2014-04-05 NOTE — Telephone Encounter (Signed)
They are okay to take together.

## 2014-05-22 ENCOUNTER — Other Ambulatory Visit: Payer: Self-pay | Admitting: Internal Medicine

## 2014-05-22 DIAGNOSIS — E119 Type 2 diabetes mellitus without complications: Secondary | ICD-10-CM

## 2014-05-22 DIAGNOSIS — I10 Essential (primary) hypertension: Secondary | ICD-10-CM

## 2014-05-22 DIAGNOSIS — E785 Hyperlipidemia, unspecified: Secondary | ICD-10-CM

## 2014-06-23 ENCOUNTER — Other Ambulatory Visit: Payer: Self-pay | Admitting: Internal Medicine

## 2014-08-05 ENCOUNTER — Ambulatory Visit: Payer: BC Managed Care – PPO | Admitting: Internal Medicine

## 2014-08-05 DIAGNOSIS — Z0289 Encounter for other administrative examinations: Secondary | ICD-10-CM

## 2014-08-20 ENCOUNTER — Other Ambulatory Visit: Payer: Self-pay | Admitting: Internal Medicine

## 2014-09-08 ENCOUNTER — Other Ambulatory Visit: Payer: Self-pay

## 2014-09-08 DIAGNOSIS — E119 Type 2 diabetes mellitus without complications: Secondary | ICD-10-CM

## 2014-09-08 MED ORDER — LIRAGLUTIDE 18 MG/3ML ~~LOC~~ SOPN
PEN_INJECTOR | SUBCUTANEOUS | Status: DC
Start: 2014-09-08 — End: 2014-10-25

## 2014-09-23 ENCOUNTER — Encounter: Payer: Self-pay | Admitting: Internal Medicine

## 2014-09-23 ENCOUNTER — Ambulatory Visit (INDEPENDENT_AMBULATORY_CARE_PROVIDER_SITE_OTHER): Payer: Medicare Other | Admitting: Internal Medicine

## 2014-09-23 VITALS — BP 148/94 | HR 77 | Temp 98.1°F | Wt 292.0 lb

## 2014-09-23 DIAGNOSIS — E119 Type 2 diabetes mellitus without complications: Secondary | ICD-10-CM | POA: Diagnosis not present

## 2014-09-23 DIAGNOSIS — E785 Hyperlipidemia, unspecified: Secondary | ICD-10-CM

## 2014-09-23 DIAGNOSIS — Z1239 Encounter for other screening for malignant neoplasm of breast: Secondary | ICD-10-CM | POA: Diagnosis not present

## 2014-09-23 DIAGNOSIS — K219 Gastro-esophageal reflux disease without esophagitis: Secondary | ICD-10-CM

## 2014-09-23 DIAGNOSIS — I10 Essential (primary) hypertension: Secondary | ICD-10-CM

## 2014-09-23 MED ORDER — LOSARTAN POTASSIUM 100 MG PO TABS: ORAL_TABLET | ORAL | Status: DC

## 2014-09-23 MED ORDER — PRAVASTATIN SODIUM 20 MG PO TABS: ORAL_TABLET | ORAL | Status: DC

## 2014-09-23 MED ORDER — SITAGLIPTIN PHOSPHATE 100 MG PO TABS: ORAL_TABLET | ORAL | Status: DC

## 2014-09-23 MED ORDER — PIOGLITAZONE HCL-METFORMIN HCL 15-850 MG PO TABS
ORAL_TABLET | ORAL | Status: DC
Start: 2014-09-23 — End: 2015-02-23

## 2014-09-23 NOTE — Patient Instructions (Signed)
Fat and Cholesterol Control Diet Fat and cholesterol levels in your blood and organs are influenced by your diet. High levels of fat and cholesterol may lead to diseases of the heart, small and large blood vessels, gallbladder, liver, and pancreas. CONTROLLING FAT AND CHOLESTEROL WITH DIET Although exercise and lifestyle factors are important, your diet is key. That is because certain foods are known to raise cholesterol and others to lower it. The goal is to balance foods for their effect on cholesterol and more importantly, to replace saturated and trans fat with other types of fat, such as monounsaturated fat, polyunsaturated fat, and omega-3 fatty acids. On average, a person should consume no more than 15 to 17 g of saturated fat daily. Saturated and trans fats are considered "bad" fats, and they will raise LDL cholesterol. Saturated fats are primarily found in animal products such as meats, butter, and cream. However, that does not mean you need to give up all your favorite foods. Today, there are good tasting, low-fat, low-cholesterol substitutes for most of the things you like to eat. Choose low-fat or nonfat alternatives. Choose round or loin cuts of red meat. These types of cuts are lowest in fat and cholesterol. Chicken (without the skin), fish, veal, and ground turkey breast are great choices. Eliminate fatty meats, such as hot dogs and salami. Even shellfish have little or no saturated fat. Have a 3 oz (85 g) portion when you eat lean meat, poultry, or fish. Trans fats are also called "partially hydrogenated oils." They are oils that have been scientifically manipulated so that they are solid at room temperature resulting in a longer shelf life and improved taste and texture of foods in which they are added. Trans fats are found in stick margarine, some tub margarines, cookies, crackers, and baked goods.  When baking and cooking, oils are a great substitute for butter. The monounsaturated oils are  especially beneficial since it is believed they lower LDL and raise HDL. The oils you should avoid entirely are saturated tropical oils, such as coconut and palm.  Remember to eat a lot from food groups that are naturally free of saturated and trans fat, including fish, fruit, vegetables, beans, grains (barley, rice, couscous, bulgur wheat), and pasta (without cream sauces).  IDENTIFYING FOODS THAT LOWER FAT AND CHOLESTEROL  Soluble fiber may lower your cholesterol. This type of fiber is found in fruits such as apples, vegetables such as broccoli, potatoes, and carrots, legumes such as beans, peas, and lentils, and grains such as barley. Foods fortified with plant sterols (phytosterol) may also lower cholesterol. You should eat at least 2 g per day of these foods for a cholesterol lowering effect.  Read package labels to identify low-saturated fats, trans fat free, and low-fat foods at the supermarket. Select cheeses that have only 2 to 3 g saturated fat per ounce. Use a heart-healthy tub margarine that is free of trans fats or partially hydrogenated oil. When buying baked goods (cookies, crackers), avoid partially hydrogenated oils. Breads and muffins should be made from whole grains (whole-wheat or whole oat flour, instead of "flour" or "enriched flour"). Buy non-creamy canned soups with reduced salt and no added fats.  FOOD PREPARATION TECHNIQUES  Never deep-fry. If you must fry, either stir-fry, which uses very little fat, or use non-stick cooking sprays. When possible, broil, bake, or roast meats, and steam vegetables. Instead of putting butter or margarine on vegetables, use lemon and herbs, applesauce, and cinnamon (for squash and sweet potatoes). Use nonfat   yogurt, salsa, and low-fat dressings for salads.  LOW-SATURATED FAT / LOW-FAT FOOD SUBSTITUTES Meats / Saturated Fat (g)  Avoid: Steak, marbled (3 oz/85 g) / 11 g  Choose: Steak, lean (3 oz/85 g) / 4 g  Avoid: Hamburger (3 oz/85 g) / 7  g  Choose: Hamburger, lean (3 oz/85 g) / 5 g  Avoid: Ham (3 oz/85 g) / 6 g  Choose: Ham, lean cut (3 oz/85 g) / 2.4 g  Avoid: Chicken, with skin, dark meat (3 oz/85 g) / 4 g  Choose: Chicken, skin removed, dark meat (3 oz/85 g) / 2 g  Avoid: Chicken, with skin, light meat (3 oz/85 g) / 2.5 g  Choose: Chicken, skin removed, light meat (3 oz/85 g) / 1 g Dairy / Saturated Fat (g)  Avoid: Whole milk (1 cup) / 5 g  Choose: Low-fat milk, 2% (1 cup) / 3 g  Choose: Low-fat milk, 1% (1 cup) / 1.5 g  Choose: Skim milk (1 cup) / 0.3 g  Avoid: Hard cheese (1 oz/28 g) / 6 g  Choose: Skim milk cheese (1 oz/28 g) / 2 to 3 g  Avoid: Cottage cheese, 4% fat (1 cup) / 6.5 g  Choose: Low-fat cottage cheese, 1% fat (1 cup) / 1.5 g  Avoid: Ice cream (1 cup) / 9 g  Choose: Sherbet (1 cup) / 2.5 g  Choose: Nonfat frozen yogurt (1 cup) / 0.3 g  Choose: Frozen fruit bar / trace  Avoid: Whipped cream (1 tbs) / 3.5 g  Choose: Nondairy whipped topping (1 tbs) / 1 g Condiments / Saturated Fat (g)  Avoid: Mayonnaise (1 tbs) / 2 g  Choose: Low-fat mayonnaise (1 tbs) / 1 g  Avoid: Butter (1 tbs) / 7 g  Choose: Extra light margarine (1 tbs) / 1 g  Avoid: Coconut oil (1 tbs) / 11.8 g  Choose: Olive oil (1 tbs) / 1.8 g  Choose: Corn oil (1 tbs) / 1.7 g  Choose: Safflower oil (1 tbs) / 1.2 g  Choose: Sunflower oil (1 tbs) / 1.4 g  Choose: Soybean oil (1 tbs) / 2.4 g  Choose: Canola oil (1 tbs) / 1 g Document Released: 11/05/2005 Document Revised: 03/02/2013 Document Reviewed: 02/03/2014 [employer] Patient Information 2015 [employer]. This information is not intended to replace advice given to you by your health care provider. Make sure you discuss any questions you have with your health care provider.  

## 2014-09-23 NOTE — Assessment & Plan Note (Signed)
Fasting sugars look good Microalbumin 01/2014 A1C today RX provided for actoplus, januvia and victoza Foot exam today Flu shot today

## 2014-09-23 NOTE — Assessment & Plan Note (Signed)
Elevated today but has been off meds Will repeat cbc and cmet RX sent to [facility] today for cozaar, cardura and spirinolactone.

## 2014-09-23 NOTE — Progress Notes (Signed)
Pre visit review using our clinic review tool, if applicable. No additional management support is needed unless otherwise documented below in the visit note. 

## 2014-09-23 NOTE — Assessment & Plan Note (Signed)
Continues to gain weight "unable to exercise" due to knee pain Advised trying water aerobics

## 2014-09-23 NOTE — Assessment & Plan Note (Signed)
No issues on pravastatin Will repeat lipid and CMET today RX provided for pravastatin Handout given for low fat diet

## 2014-09-23 NOTE — Progress Notes (Signed)
Subjective:    Patient ID: [name], female    DOB: Jun 20, 1946, 68 y.o.   MRN: [id number]  HPI  Pt presents to the clinic today for follow up of chronic conditions.  HTN: BP elevated today 148/94 on Cozaar, Cardura and Spirolactone. She has been out of her medication for the last 3 weeks. She has noticed some double visions but denies dizziness, chest pain or chest tightness.  HLD: Denies myalgias on Pravastatin. She has been out of her medication for the last 3 weeks.  DM2:  Fasting blood sugars range from 46-128. Takes Actoplus, Januvia and Victoza. She has been out of her medication the last 3 weeks.  Microalbumin done 01/2014. Flu shot 2014. Pneumonia shot 2013. Eye exam 12/2013.  GERD: No recent flares on Protonix. She has been out of her medication for the last 3 weeks.  Obesity: Has gained 4 lbs since her last visit. She is s/p gastric bypass. Has worked on diet but unable to lose weight. Unable to exercise secondary to knee pain.  Review of Systems      Past Medical History  Diagnosis Date  . Diabetes mellitus   . Hypertension   . Hyperlipidemia   . Obesity   . GERD (gastroesophageal reflux disease)   . Anemia   . Blood transfusion without reported diagnosis   . Cataract   . Arthritis     Current Outpatient Prescriptions  Medication Sig Dispense Refill  . aspirin 81 MG tablet Take 81 mg by mouth daily.    [name] doxazosin (CARDURA) 4 MG tablet Take 1 tablet (4 mg total) by mouth at bedtime. 90 tablet 6  . Insulin Pen Needle 31G X 5 MM MISC 1 each by Does not apply route daily as needed. 100 each 6  . JANUVIA 100 MG tablet TAKE 1 TABLET BY MOUTH DAILY 30 tablet 3  . Liraglutide 18 MG/3ML SOPN Inject 0.6 mg daily x 1 week, then increase to 1.2 mg daily thereafter 1 pen 0  . losartan (COZAAR) 100 MG tablet TAKE 1 TABLET (100 MG TOTAL) BY MOUTH DAILY. 30 tablet 0  . pantoprazole (PROTONIX) 40 MG tablet Take 1 tablet (40 mg total) by mouth daily. 30 tablet 3  .  pioglitazone-metformin (ACTOPLUS MET) 15-850 MG per tablet TAKE 1 TABLET BY MOUTH 2 (TWO) TIMES DAILY WITH A MEAL. 60 tablet 0  . pravastatin (PRAVACHOL) 20 MG tablet TAKE 1 TABLET (20 MG TOTAL) BY MOUTH DAILY. 30 tablet 0  . spironolactone (ALDACTONE) 25 MG tablet TAKE 1 TABLET (25 MG TOTAL) BY MOUTH DAILY. 30 tablet 0  . traMADol (ULTRAM) 50 MG tablet Take 1-2 tablets (50-100 mg total) by mouth every 6 (six) hours as needed for pain. 45 tablet 0   No current facility-administered medications for this visit.    Allergies  Allergen Reactions  . Iodinated Diagnostic Agents   . Sulfa Antibiotics     Family History  Problem Relation Age of Onset  . Stomach cancer Sister   . Diabetes Sister   . Diabetes Mother   . Heart attack Mother   . Hypertension Mother   . Hyperlipidemia Mother   . Diabetes Sister   . Heart attack Father   . Stroke Father   . Hypertension Father   . Hyperlipidemia Father   . Diabetes Sister     History   Social History  . Marital Status: Divorced    Spouse Name: N/A    Number of Children: 3  .  Years of Education: doctorate   Occupational History  . Teacher    Social History Main Topics  . Smoking status: Never Smoker   . Smokeless tobacco: Never Used  . Alcohol Use: No  . Drug Use: No  . Sexual Activity: Not on file   Other Topics Concern  . Not on file   Social History Narrative   Caffeine use-yes   Regular exercise-no     Constitutional: Denies fever, malaise, fatigue, headache or abrupt weight changes.  HEENT: Denies eye pain, eye redness, ear pain, ringing in the ears, wax buildup, runny nose, nasal congestion, bloody nose, or sore throat. Respiratory: Denies difficulty breathing, shortness of breath, cough or sputum production.   Cardiovascular: Denies chest pain, chest tightness, palpitations or swelling in the hands or feet.  Gastrointestinal: Denies abdominal pain, bloating, constipation, diarrhea or blood in the stool.  GU:  Denies urgency, frequency, pain with urination, burning sensation, blood in urine, odor or discharge. Musculoskeletal: Denies decrease in range of motion, difficulty with gait, muscle pain or joint pain and swelling.  Skin: Denies redness, rashes, lesions or ulcercations.  Neurological: Denies dizziness, difficulty with memory, difficulty with speech or problems with balance and coordination.   No other specific complaints in a complete review of systems (except as listed in HPI above).  Objective:   Physical Exam  BP 148/94 mmHg  Pulse 77  Temp(Src) 98.1 F (36.7 C) (Oral)  Wt 292 lb (132.45 kg)  SpO2 98% Wt Readings from Last 3 Encounters:  09/23/14 292 lb (132.45 kg)  01/29/14 288 lb 8 oz (130.863 kg)  06/11/13 289 lb (131.09 kg)    General: Appears their stated age, well developed, well nourished in NAD. Skin: Warm, dry and intact. No rashes, lesions or ulcerations noted. HEENT: Head: normal shape and size; Eyes: sclera white, no icterus, conjunctiva pink, PERRLA and EOMs intact; Ears: Tm's gray and intact, normal light reflex; Nose: mucosa pink and moist, septum midline; Throat/Mouth: Teeth present, mucosa pink and moist, no exudate, lesions or ulcerations noted.  Neck: Normal range of motion. Neck supple, trachea midline. No massses, lumps or thyromegaly present.  Cardiovascular: Normal rate and rhythm. S1,S2 noted.  No murmur, rubs or gallops noted. No JVD or BLE edema. No carotid bruits noted. Pulmonary/Chest: Normal effort and positive vesicular breath sounds. No respiratory distress. No wheezes, rales or ronchi noted.  Abdomen: Soft and nontender. Normal bowel sounds, no bruits noted. No distention or masses noted. Liver, spleen and kidneys non palpable. Musculoskeletal: Normal range of motion. No signs of joint swelling. No difficulty with gait.  Neurological: Alert and oriented. Cranial nerves II-XII intact. Coordination normal. +DTRs bilaterally. Psychiatric: Mood and  affect normal. Behavior is normal. Judgment and thought content normal.   EKG:  BMET    Component Value Date/Time   NA 139 01/29/2014 1049   K 4.2 01/29/2014 1049   CL 105 01/29/2014 1049   CO2 29 01/29/2014 1049   GLUCOSE 89 01/29/2014 1049   BUN 23 01/29/2014 1049   CREATININE 1.1 01/29/2014 1049   CALCIUM 9.2 01/29/2014 1049   GFRNONAA 55* 10/13/2011 0510   GFRAA 64* 10/13/2011 0510    Lipid Panel     Component Value Date/Time   CHOL 232* 01/29/2014 1049   TRIG 60.0 01/29/2014 1049   HDL 64.80 01/29/2014 1049   CHOLHDL 4 01/29/2014 1049   VLDL 12.0 01/29/2014 1049   LDLCALC 155* 01/29/2014 1049    CBC    Component Value Date/Time  WBC 4.7 01/29/2014 1049   RBC 4.62 01/29/2014 1049   RBC 4.55 05/25/2007 1320   HGB 10.1* 01/29/2014 1049   HCT 32.5* 01/29/2014 1049   PLT 216.0 01/29/2014 1049   MCV 70.3* 01/29/2014 1049   MCH 22.2* 10/13/2011 0510   MCHC 31.1 01/29/2014 1049   RDW 16.3* 01/29/2014 1049   LYMPHSABS 1.4 10/12/2011 1625   MONOABS 0.4 10/12/2011 1625   EOSABS 0.1 10/12/2011 1625   BASOSABS 0.0 10/12/2011 1625    Hgb A1C Lab Results  Component Value Date   HGBA1C 6.0 01/29/2014         Assessment & Plan:

## 2014-09-23 NOTE — Assessment & Plan Note (Signed)
Controlled on protonix Will check CBC and CMET today

## 2014-09-24 LAB — COMPREHENSIVE METABOLIC PANEL
ALT: 13 U/L (ref 0–35)
AST: 20 U/L (ref 0–37)
Albumin: 3.4 g/dL — ABNORMAL LOW (ref 3.5–5.2)
Alkaline Phosphatase: 63 U/L (ref 39–117)
BILIRUBIN TOTAL: 0.6 mg/dL (ref 0.2–1.2)
BUN: 24 mg/dL — ABNORMAL HIGH (ref 6–23)
CO2: 28 mEq/L (ref 19–32)
Calcium: 9.4 mg/dL (ref 8.4–10.5)
Chloride: 103 mEq/L (ref 96–112)
Creatinine, Ser: 1.2 mg/dL (ref 0.4–1.2)
GFR: 59.93 mL/min — ABNORMAL LOW (ref >60.00)
GLUCOSE: 65 mg/dL — AB (ref 70–99)
Potassium: 4.3 mEq/L (ref 3.5–5.1)
SODIUM: 139 meq/L (ref 135–145)
TOTAL PROTEIN: 7 g/dL (ref 6.0–8.3)

## 2014-09-24 LAB — CBC
HEMATOCRIT: 33.9 % — AB (ref 36.0–46.0)
Hemoglobin: 10.5 g/dL — ABNORMAL LOW (ref 12.0–15.0)
MCHC: 30.9 g/dL (ref 30.0–36.0)
MCV: 70.8 fl — AB (ref 78.0–100.0)
Platelets: 233 10*3/uL (ref 150.0–400.0)
RBC: 4.78 Mil/uL (ref 3.87–5.11)
RDW: 16.5 % — ABNORMAL HIGH (ref 11.5–15.5)
WBC: 5.3 10*3/uL (ref 4.0–10.5)

## 2014-09-24 LAB — LIPID PANEL
CHOLESTEROL: 194 mg/dL (ref 0–200)
HDL: 55 mg/dL (ref >39.00)
LDL Cholesterol: 127 mg/dL — ABNORMAL HIGH (ref 0–99)
NonHDL: 139
Total CHOL/HDL Ratio: 4
Triglycerides: 60 mg/dL (ref 0.0–149.0)
VLDL: 12 mg/dL (ref 0.0–40.0)

## 2014-09-24 LAB — HEMOGLOBIN A1C: Hgb A1c MFr Bld: 5.8 % (ref 4.6–6.5)

## 2014-09-30 ENCOUNTER — Other Ambulatory Visit: Payer: Self-pay

## 2014-09-30 MED ORDER — SPIRONOLACTONE 25 MG PO TABS: 25.0000 mg | ORAL_TABLET | Freq: Every day | ORAL | Status: DC

## 2014-10-07 ENCOUNTER — Ambulatory Visit
Admission: RE | Admit: 2014-10-07 | Discharge: 2014-10-07 | Disposition: A | Discharge status: Home / Self Care | Payer: Medicare Other | Source: Ambulatory Visit | Attending: Internal Medicine | Admitting: Internal Medicine

## 2014-10-07 DIAGNOSIS — Z1239 Encounter for other screening for malignant neoplasm of breast: Secondary | ICD-10-CM

## 2014-10-07 DIAGNOSIS — Z1231 Encounter for screening mammogram for malignant neoplasm of breast: Secondary | ICD-10-CM | POA: Diagnosis not present

## 2014-10-25 ENCOUNTER — Other Ambulatory Visit: Payer: Self-pay | Admitting: Internal Medicine

## 2015-02-22 ENCOUNTER — Other Ambulatory Visit: Payer: Self-pay | Admitting: Internal Medicine

## 2015-02-23 ENCOUNTER — Other Ambulatory Visit: Payer: Self-pay

## 2015-02-23 DIAGNOSIS — I10 Essential (primary) hypertension: Secondary | ICD-10-CM

## 2015-02-23 DIAGNOSIS — E119 Type 2 diabetes mellitus without complications: Secondary | ICD-10-CM

## 2015-02-23 DIAGNOSIS — E785 Hyperlipidemia, unspecified: Secondary | ICD-10-CM

## 2015-02-23 MED ORDER — PRAVASTATIN SODIUM 20 MG PO TABS: ORAL_TABLET | ORAL | Status: DC

## 2015-02-23 MED ORDER — SPIRONOLACTONE 25 MG PO TABS: 25.0000 mg | ORAL_TABLET | Freq: Every day | ORAL | Status: DC

## 2015-02-23 MED ORDER — PIOGLITAZONE HCL-METFORMIN HCL 15-850 MG PO TABS: ORAL_TABLET | ORAL | Status: DC

## 2015-02-23 MED ORDER — LOSARTAN POTASSIUM 100 MG PO TABS: ORAL_TABLET | ORAL | Status: DC

## 2015-02-23 MED ORDER — SITAGLIPTIN PHOSPHATE 100 MG PO TABS: ORAL_TABLET | ORAL | Status: DC

## 2015-03-24 ENCOUNTER — Ambulatory Visit: Payer: BC Managed Care – PPO | Admitting: Internal Medicine

## 2015-03-24 DIAGNOSIS — Z0289 Encounter for other administrative examinations: Secondary | ICD-10-CM

## 2015-04-25 ENCOUNTER — Other Ambulatory Visit: Payer: Self-pay | Admitting: Internal Medicine

## 2015-04-28 ENCOUNTER — Other Ambulatory Visit: Payer: Self-pay

## 2015-04-28 DIAGNOSIS — I10 Essential (primary) hypertension: Secondary | ICD-10-CM

## 2015-04-28 DIAGNOSIS — E119 Type 2 diabetes mellitus without complications: Secondary | ICD-10-CM

## 2015-04-28 MED ORDER — INSULIN PEN NEEDLE 31G X 5 MM MISC: 1.0000 | Freq: Every day | Status: DC | PRN

## 2015-04-28 MED ORDER — DOXAZOSIN MESYLATE 4 MG PO TABS: 4.0000 mg | ORAL_TABLET | Freq: Every day | ORAL | Status: DC

## 2015-04-28 NOTE — Addendum Note (Signed)
Addended by: Lurlean Nanny on: 04/28/2015 12:25 PM   Modules accepted: Orders

## 2015-06-23 ENCOUNTER — Other Ambulatory Visit: Payer: Self-pay | Admitting: Internal Medicine

## 2015-07-25 ENCOUNTER — Other Ambulatory Visit: Payer: Self-pay | Admitting: Internal Medicine

## 2015-07-26 ENCOUNTER — Other Ambulatory Visit: Payer: Self-pay

## 2015-07-26 DIAGNOSIS — I10 Essential (primary) hypertension: Secondary | ICD-10-CM

## 2015-07-26 MED ORDER — LOSARTAN POTASSIUM 100 MG PO TABS: ORAL_TABLET | ORAL | Status: DC

## 2015-08-16 ENCOUNTER — Other Ambulatory Visit: Payer: Self-pay | Admitting: Internal Medicine

## 2015-08-24 ENCOUNTER — Other Ambulatory Visit: Payer: Self-pay | Admitting: Internal Medicine

## 2015-08-25 ENCOUNTER — Other Ambulatory Visit: Payer: Self-pay | Admitting: Internal Medicine

## 2015-09-26 ENCOUNTER — Other Ambulatory Visit: Payer: Self-pay | Admitting: Internal Medicine

## 2015-09-27 MED ORDER — SITAGLIPTIN PHOSPHATE 100 MG PO TABS: 100.0000 mg | ORAL_TABLET | Freq: Every day | ORAL | Status: DC

## 2015-09-27 MED ORDER — SPIRONOLACTONE 25 MG PO TABS: 25.0000 mg | ORAL_TABLET | Freq: Every day | ORAL | Status: DC

## 2015-09-27 NOTE — Addendum Note (Signed)
Addended by: Lurlean Nanny on: 09/27/2015 08:41 AM   Modules accepted: Orders

## 2015-10-10 ENCOUNTER — Ambulatory Visit (INDEPENDENT_AMBULATORY_CARE_PROVIDER_SITE_OTHER): Payer: Medicare Other | Admitting: Internal Medicine

## 2015-10-10 ENCOUNTER — Encounter: Payer: Self-pay | Admitting: Internal Medicine

## 2015-10-10 VITALS — BP 146/78 | HR 98 | Temp 98.0°F | Wt 297.0 lb

## 2015-10-10 DIAGNOSIS — E785 Hyperlipidemia, unspecified: Secondary | ICD-10-CM | POA: Diagnosis not present

## 2015-10-10 DIAGNOSIS — E119 Type 2 diabetes mellitus without complications: Secondary | ICD-10-CM | POA: Diagnosis not present

## 2015-10-10 DIAGNOSIS — I10 Essential (primary) hypertension: Secondary | ICD-10-CM | POA: Diagnosis not present

## 2015-10-10 DIAGNOSIS — R0609 Other forms of dyspnea: Secondary | ICD-10-CM | POA: Diagnosis not present

## 2015-10-10 DIAGNOSIS — R0789 Other chest pain: Secondary | ICD-10-CM

## 2015-10-10 DIAGNOSIS — H6123 Impacted cerumen, bilateral: Secondary | ICD-10-CM

## 2015-10-10 DIAGNOSIS — Z23 Encounter for immunization: Secondary | ICD-10-CM

## 2015-10-10 DIAGNOSIS — E669 Obesity, unspecified: Secondary | ICD-10-CM

## 2015-10-10 DIAGNOSIS — N289 Disorder of kidney and ureter, unspecified: Secondary | ICD-10-CM

## 2015-10-10 DIAGNOSIS — K219 Gastro-esophageal reflux disease without esophagitis: Secondary | ICD-10-CM

## 2015-10-10 NOTE — Patient Instructions (Signed)

## 2015-10-10 NOTE — Assessment & Plan Note (Signed)
Will check A1C today No microalbumin secondary to ARB therapy Will consider stopping Januvia if A1C < 8 Continue Actoplus-Met and Victoza for now Reinforce low carb diet Flu and Prevnar today Advised her to schedule an annual eye exam

## 2015-10-10 NOTE — Addendum Note (Signed)
Addended by: Lindalou Hose Y on: 10/10/2015 05:00 PM   Modules accepted: Orders

## 2015-10-10 NOTE — Assessment & Plan Note (Signed)
Encouraged her to work on diet and exercise 

## 2015-10-10 NOTE — Assessment & Plan Note (Signed)
Controlled on current meds Given DOE and chest tightness will refer to cardiology for possible stress test

## 2015-10-10 NOTE — Progress Notes (Signed)
Subjective:    Patient ID: Alicia [name], female    DOB: 1946-08-07, 69 y.o.   MRN: [id number]  HPI  Pt presents to the clinic today for follow up of chronic conditions. She was seen 1 year ago.  DM 2: Her last A1C was 5.8%. She is taking Actoplus- Met, Victoza and Januvia as prescribed. She has not been testing her sugars. She would like a flu shot today. Pneumovax: 2013. Prevnar never. Her last eye exam was 01/2014.  HLD: Her last LDL was 127. She is taking Pravastatin as prescribed. She denies myalgias. She tries to consume a low fat diet.  HTN: Her BP today is 146/78. She is taking Cardura, Losartan and Aldactone as prescribed. She has had intermittent chest tightness, shortness of breath, palpitations and blurred vision. Nothing has been severe or persistent. ECG from 2013 reviewed.  GERD: She denies breakthrough symptoms on Protonix.  Obesity: She has gained 11 lbs in the last year. Her BMI is 49.42. She has been trying to eat right but is not able to exercise.  She also reports a hearing problem. This has been intermittent over the last year. She reports people are telling her that she is having trouble hearing. She denies pain in her ears.  Review of Systems  Past Medical History  Diagnosis Date  . Diabetes mellitus   . Hypertension   . Hyperlipidemia   . Obesity   . GERD (gastroesophageal reflux disease)   . Anemia   . Blood transfusion without reported diagnosis   . Cataract   . Arthritis     Current Outpatient Prescriptions  Medication Sig Dispense Refill  . aspirin 81 MG tablet Take 81 mg by mouth daily.    [name] doxazosin (CARDURA) 4 MG tablet Take 1 tablet (4 mg total) by mouth at bedtime. NEED TO SCHEDULE FOLLOW UP OFFICE VISIT FOR MORE REFILLS 90 tablet 0  . Insulin Pen Needle 31G X 5 MM MISC 1 each by Does not apply route daily as needed. 100 each 6  . Liraglutide (VICTOZA) 18 MG/3ML SOPN Inject 0.2 mLs (1.2 mg total) into the skin daily. NO MORE REFILLS WITHOUT  ANNUAL OFFICE VISIT [phone] 2 pen 1  . losartan (COZAAR) 100 MG tablet TAKE 1 TABLET (100 MG TOTAL) BY MOUTH DAILY. NEED TO SCHEDULE ANNUAL PHYSICAL FOR MORE REFILLS [phone] 30 tablet 1  . pantoprazole (PROTONIX) 40 MG tablet Take 1 tablet (40 mg total) by mouth daily. 30 tablet 3  . pioglitazone-metformin (ACTOPLUS MET) 15-850 MG tablet Take 1 tablet by mouth 2 (two) times daily with a meal. NO MORE REFILLS WITHOUT ANNUAL OFFICE VISIT [phone] 60 tablet 1  . pravastatin (PRAVACHOL) 20 MG tablet Take 1 tablet (20 mg total) by mouth daily. NO MORE REFILLS WITHOUT OFFICE VISIT 30 tablet 1  . sitaGLIPtin (JANUVIA) 100 MG tablet Take 1 tablet (100 mg total) by mouth daily. MUST SCHEDULE ANNUAL PHYSICAL FOR MORE REFILL [phone] 30 tablet 0  . spironolactone (ALDACTONE) 25 MG tablet Take 1 tablet (25 mg total) by mouth daily. MUST SCHEDULE ANNUAL PHYSICAL FOR MORE REFILL [phone] 30 tablet 0  . traMADol (ULTRAM) 50 MG tablet Take 1-2 tablets (50-100 mg total) by mouth every 6 (six) hours as needed for pain. 45 tablet 0   No current facility-administered medications for this visit.    Allergies  Allergen Reactions  . Iodinated Diagnostic Agents   . Sulfa Antibiotics     Family History  Problem Relation Age of  Onset  . Stomach cancer Sister   . Diabetes Sister   . Diabetes Mother   . Heart attack Mother   . Hypertension Mother   . Hyperlipidemia Mother   . Diabetes Sister   . Heart attack Father   . Stroke Father   . Hypertension Father   . Hyperlipidemia Father   . Diabetes Sister     Social History   Social History  . Marital Status: Divorced    Spouse Name: N/A  . Number of Children: 3  . Years of Education: doctorate   Occupational History  . Teacher    Social History Main Topics  . Smoking status: Never Smoker   . Smokeless tobacco: Never Used  . Alcohol Use: No  . Drug Use: No  . Sexual Activity: Not on file   Other Topics Concern  . Not on  file   Social History Narrative   Caffeine use-yes   Regular exercise-no     Constitutional: Pt reports weight gain. Denies fever, malaise, fatigue, headache.  HEENT: Pt reports blurred vision and decreased hearing. Denies eye pain, eye redness, ear pain, ringing in the ears, wax buildup, runny nose, nasal congestion, bloody nose, or sore throat. Respiratory: Pt reports occasional shortness of breath. Denies difficulty breathing, cough or sputum production.   Cardiovascular: Pt reports occasional chest tightness and palpitations. Denies chest pain, chest tightness, palpitations or swelling in the hands or feet.  Gastrointestinal: Denies abdominal pain, bloating, constipation, diarrhea or blood in the stool.  GU: Denies urgency, frequency, pain with urination, burning sensation, blood in urine, odor or discharge. Musculoskeletal: Pt reports bilateral knee pain. Denies decrease in range of motion, difficulty with gait, muscle pain or joint swelling.  Skin: Denies redness, rashes, lesions or ulcercations.  Neurological: Denies dizziness, difficulty with memory, difficulty with speech or problems with balance and coordination.  Psych: Denies anxiety, depression, SI/HI.  No other specific complaints in a complete review of systems (except as listed in HPI above).     Objective:   Physical Exam   BP 146/78 mmHg  Pulse 98  Temp(Src) 98 F (36.7 C) (Oral)  Wt 297 lb (134.718 kg)  SpO2 99% Wt Readings from Last 3 Encounters:  10/10/15 297 lb (134.718 kg)  09/23/14 292 lb (132.45 kg)  01/29/14 288 lb 8 oz (130.863 kg)    General: Appears her stated age, obese in NAD. Skin: Warm, dry and intact. No rashes, lesions or ulcerations noted. HEENT: Ears: Bilateral cerumen impaction.  Cardiovascular: Normal rate and rhythm. S1,S2 noted.  No murmur, rubs or gallops noted. No JVD or BLE edema. No carotid bruits noted. Pulmonary/Chest: Normal effort and positive vesicular breath sounds. No  respiratory distress. No wheezes, rales or ronchi noted.  Abdomen: Soft and nontender. Normal bowel sounds. Neurological: Alert and oriented. Sensation intact to BLE.   BMET    Component Value Date/Time   NA 139 09/23/2014 1637   K 4.3 09/23/2014 1637   CL 103 09/23/2014 1637   CO2 28 09/23/2014 1637   GLUCOSE 65* 09/23/2014 1637   BUN 24* 09/23/2014 1637   CREATININE 1.2 09/23/2014 1637   CALCIUM 9.4 09/23/2014 1637   GFRNONAA 55* 10/13/2011 0510   GFRAA 64* 10/13/2011 0510    Lipid Panel     Component Value Date/Time   CHOL 194 09/23/2014 1637   TRIG 60.0 09/23/2014 1637   HDL 55.00 09/23/2014 1637   CHOLHDL 4 09/23/2014 1637   VLDL 12.0 09/23/2014 1637   LDLCALC  127* 09/23/2014 1637    CBC    Component Value Date/Time   WBC 5.3 09/23/2014 1637   RBC 4.78 09/23/2014 1637   RBC 4.55 05/25/2007 1320   HGB 10.5* 09/23/2014 1637   HCT 33.9* 09/23/2014 1637   PLT 233.0 09/23/2014 1637   MCV 70.8* 09/23/2014 1637   MCH 22.2* 10/13/2011 0510   MCHC 30.9 09/23/2014 1637   RDW 16.5* 09/23/2014 1637   LYMPHSABS 1.4 10/12/2011 1625   MONOABS 0.4 10/12/2011 1625   EOSABS 0.1 10/12/2011 1625   BASOSABS 0.0 10/12/2011 1625    Hgb A1C Lab Results  Component Value Date   HGBA1C 5.8 09/23/2014        Assessment & Plan:   Bilateral Cerumen Impaction:  Manual lavage by CMA Advised her to try Debrox twice per week to avoid wax buildup  RTC ibn 6 months for Medicare Wellness/Followup

## 2015-10-10 NOTE — Progress Notes (Signed)
Pre visit review using our clinic review tool, if applicable. No additional management support is needed unless otherwise documented below in the visit note. 

## 2015-10-10 NOTE — Assessment & Plan Note (Signed)
Continue Protonix Will check CMET today

## 2015-10-10 NOTE — Assessment & Plan Note (Signed)
Encouraged her to consume a low fat diet Will check CMET and Lipid Profile today Continue Pravastin

## 2015-10-11 LAB — COMPREHENSIVE METABOLIC PANEL
ALT: 11 U/L (ref 0–35)
AST: 18 U/L (ref 0–37)
Albumin: 4 g/dL (ref 3.5–5.2)
Alkaline Phosphatase: 65 U/L (ref 39–117)
BUN: 21 mg/dL (ref 6–23)
CALCIUM: 9.3 mg/dL (ref 8.4–10.5)
CHLORIDE: 103 meq/L (ref 96–112)
CO2: 29 meq/L (ref 19–32)
Creatinine, Ser: 1.32 mg/dL — ABNORMAL HIGH (ref 0.40–1.20)
GFR: 51.46 mL/min — AB (ref >60.00)
GLUCOSE: 87 mg/dL (ref 70–99)
Potassium: 4.2 mEq/L (ref 3.5–5.1)
Sodium: 139 mEq/L (ref 135–145)
Total Bilirubin: 0.6 mg/dL (ref 0.2–1.2)
Total Protein: 7 g/dL (ref 6.0–8.3)

## 2015-10-11 LAB — CBC
HCT: 31.9 % — ABNORMAL LOW (ref 36.0–46.0)
HEMOGLOBIN: 9.7 g/dL — AB (ref 12.0–15.0)
MCHC: 30.5 g/dL (ref 30.0–36.0)
MCV: 67.8 fl — AB (ref 78.0–100.0)
PLATELETS: 266 10*3/uL (ref 150.0–400.0)
RBC: 4.7 Mil/uL (ref 3.87–5.11)
RDW: 19.9 % — AB (ref 11.5–15.5)
WBC: 5.3 10*3/uL (ref 4.0–10.5)

## 2015-10-11 LAB — HEMOGLOBIN A1C: HEMOGLOBIN A1C: 6 % (ref 4.6–6.5)

## 2015-10-11 LAB — LIPID PANEL
CHOLESTEROL: 208 mg/dL — AB (ref 0–200)
HDL: 69.1 mg/dL (ref >39.00)
LDL Cholesterol: 130 mg/dL — ABNORMAL HIGH (ref 0–99)
NonHDL: 139.36
TRIGLYCERIDES: 47 mg/dL (ref 0.0–149.0)
Total CHOL/HDL Ratio: 3
VLDL: 9.4 mg/dL (ref 0.0–40.0)

## 2015-10-12 MED ORDER — DOXAZOSIN MESYLATE 4 MG PO TABS: 4.0000 mg | ORAL_TABLET | Freq: Every day | ORAL | Status: DC

## 2015-10-12 MED ORDER — INSULIN PEN NEEDLE 31G X 5 MM MISC: 1.0000 | Freq: Every day | Status: DC | PRN

## 2015-10-12 MED ORDER — SPIRONOLACTONE 25 MG PO TABS: 25.0000 mg | ORAL_TABLET | Freq: Every day | ORAL | Status: DC

## 2015-10-12 MED ORDER — GLIPIZIDE 5 MG PO TABS: 5.0000 mg | ORAL_TABLET | Freq: Two times a day (BID) | ORAL | Status: DC

## 2015-10-12 MED ORDER — PRAVASTATIN SODIUM 40 MG PO TABS: 40.0000 mg | ORAL_TABLET | Freq: Every day | ORAL | Status: DC

## 2015-10-12 MED ORDER — SITAGLIPTIN PHOSPHATE 100 MG PO TABS: 100.0000 mg | ORAL_TABLET | Freq: Every day | ORAL | Status: DC

## 2015-10-12 MED ORDER — LIRAGLUTIDE 18 MG/3ML ~~LOC~~ SOPN: 0.2000 mL | PEN_INJECTOR | Freq: Every day | SUBCUTANEOUS | Status: DC

## 2015-10-12 NOTE — Addendum Note (Signed)
Addended by: Lurlean Nanny on: 10/12/2015 10:32 AM   Modules accepted: Orders, Medications

## 2015-10-17 ENCOUNTER — Other Ambulatory Visit: Payer: Self-pay | Admitting: Nurse Practitioner

## 2015-10-17 ENCOUNTER — Encounter: Payer: Self-pay | Admitting: Nurse Practitioner

## 2015-10-17 ENCOUNTER — Ambulatory Visit
Admission: RE | Admit: 2015-10-17 | Discharge: 2015-10-17 | Disposition: A | Discharge status: Home / Self Care | Payer: Medicare Other | Source: Ambulatory Visit | Attending: Nurse Practitioner | Admitting: Nurse Practitioner

## 2015-10-17 ENCOUNTER — Ambulatory Visit (INDEPENDENT_AMBULATORY_CARE_PROVIDER_SITE_OTHER): Payer: Medicare Other | Admitting: Nurse Practitioner

## 2015-10-17 VITALS — BP 178/106 | HR 73 | Ht 65.0 in | Wt 299.2 lb

## 2015-10-17 DIAGNOSIS — E785 Hyperlipidemia, unspecified: Secondary | ICD-10-CM

## 2015-10-17 DIAGNOSIS — I10 Essential (primary) hypertension: Secondary | ICD-10-CM | POA: Diagnosis not present

## 2015-10-17 DIAGNOSIS — R0600 Dyspnea, unspecified: Secondary | ICD-10-CM

## 2015-10-17 DIAGNOSIS — R0789 Other chest pain: Secondary | ICD-10-CM | POA: Diagnosis not present

## 2015-10-17 DIAGNOSIS — R062 Wheezing: Secondary | ICD-10-CM | POA: Diagnosis not present

## 2015-10-17 LAB — PROTIME-INR
INR: 1.03 (ref <1.50)
Prothrombin Time: 13.6 seconds (ref 11.6–15.2)

## 2015-10-17 LAB — BASIC METABOLIC PANEL
BUN: 20 mg/dL (ref 7–25)
CO2: 29 mmol/L (ref 20–31)
Calcium: 9.1 mg/dL (ref 8.6–10.4)
Chloride: 104 mmol/L (ref 98–110)
Creat: 1.05 mg/dL — ABNORMAL HIGH (ref 0.50–0.99)
Glucose, Bld: 73 mg/dL (ref 65–99)
Potassium: 3.9 mmol/L (ref 3.5–5.3)
Sodium: 139 mmol/L (ref 135–146)

## 2015-10-17 LAB — CBC
HCT: 29.8 % — ABNORMAL LOW (ref 36.0–46.0)
Hemoglobin: 9.3 g/dL — ABNORMAL LOW (ref 12.0–15.0)
MCH: 20.9 pg — ABNORMAL LOW (ref 26.0–34.0)
MCHC: 31.2 g/dL (ref 30.0–36.0)
MCV: 67.1 fL — ABNORMAL LOW (ref 78.0–100.0)
Platelets: 267 10*3/uL (ref 150–400)
RBC: 4.44 MIL/uL (ref 3.87–5.11)
RDW: 18.7 % — ABNORMAL HIGH (ref 11.5–15.5)
WBC: 4.9 10*3/uL (ref 4.0–10.5)

## 2015-10-17 LAB — APTT: aPTT: 35 seconds (ref 24–37)

## 2015-10-17 LAB — BRAIN NATRIURETIC PEPTIDE: Brain Natriuretic Peptide: 32.3 pg/mL (ref 0.0–100.0)

## 2015-10-17 NOTE — Patient Instructions (Addendum)
We will be checking the following labs today - BMET, CBC, PT, PTT, BNP  Please go to [facility] to [facility] on the first floor for a chest Xray - you may walk in.    Medication Instructions:    Continue with your current medicines.     Testing/Procedures To Be Arranged:  Echocardiogram  CXR  Cardiac catheterization  Follow-Up:   Will see how your studies turn out and then decide about your follow up     Other Special Instructions:  Your provider has recommended a cardiac catherization  You are scheduled for a cardiac catheterization on Friday, December 2nd at 7:30 AM with Dr. Burt Knack or associate.  Go to [facility] 2nd Floor Short Stay on Friday, December 2nd at 5:30 AM.  Enter thru the [facility] entrance A No food or drink after midnight on Thursday. You may take your medications with a sip of water on the day of your procedure BUT HOLD YOUR DIABETIC MEDICINES ON THE DAY OF YOUR PROCEDURE   Coronary Angiogram A coronary angiogram, also called coronary angiography, is an X-ray procedure used to look at the arteries in the heart. In this procedure, a dye (contrast dye) is injected through a long, hollow tube (catheter). The catheter is about the size of a piece of cooked spaghetti and is inserted through your groin, wrist, or arm. The dye is injected into each artery, and X-rays are then taken to show if there is a blockage in the arteries of your heart.  LET [facility] CARE PROVIDER KNOW ABOUT: Any allergies you have, including allergies to shellfish or contrast dye.  All medicines you are taking, including vitamins, herbs, eye drops, creams, and over-the-counter medicines.  Previous problems you or members of your family have had with the use of anesthetics.  Any blood disorders you have.  Previous surgeries you have had. History of kidney problems or failure.  Other medical conditions you have.  RISKS AND COMPLICATIONS  Generally, a coronary  angiogram is a safe procedure. However, about 1 person out of 1000 can have problems that may include: Allergic reaction to the dye. Bleeding/bruising from the access site or other locations. Kidney injury, especially in people with impaired kidney function. Stroke (rare). Heart attack (rare). Irregular rhythms (rare) Death (rare)  BEFORE THE PROCEDURE  Do not eat or drink anything after midnight the night before the procedure or as directed by your health care provider.  Ask your health care provider about changing or stopping your regular medicines. This is especially important if you are taking diabetes medicines or blood thinners.  PROCEDURE You may be given a medicine to help you relax (sedative) before the procedure. This medicine is given through an intravenous (IV) access tube that is inserted into one of your veins.  The area where the catheter will be inserted will be washed and shaved. This is usually done in the groin but may be done in the fold of your arm (near your elbow) or in the wrist.  A medicine will be given to numb the area where the catheter will be inserted (local anesthetic).  The health care provider will insert the catheter into an artery. The catheter will be guided by using a special type of X-ray (fluoroscopy) of the blood vessel being examined.  A special dye will then be injected into the catheter, and X-rays will be taken. The dye will help to show where any narrowing or blockages are located in the heart arteries.  AFTER THE PROCEDURE  If the procedure is done through the leg, you will be kept in bed lying flat for several hours. You will be instructed to not bend or cross your legs. The insertion site will be checked frequently.  The pulse in your feet or wrist will be checked frequently.  Additional blood tests, X-rays, and an electrocardiogram may be done.       If you need a refill on your cardiac medications before your next  appointment, please call your pharmacy.   Call the [place] office at [phone] if you have any questions, problems or concerns.

## 2015-10-17 NOTE — Progress Notes (Signed)
CARDIOLOGY OFFICE NOTE  Date:  10/17/2015    Alicia [name] Date of Birth: 11-19-1946 Medical Record [id number]  PCP:  Webb Silversmith, NP  Cardiologist:  Harrington Challenger (NEW)    Chief Complaint  Patient presents with  . Chest Pain    New patient visit - seen for Dr. Harrington Challenger (DOD)    History of Present Illness: Alicia [name] is a 69 y.o. female who presents today for a new patient visit. Seen for Dr. Harrington Challenger (DOD today). She has a history of HTN, HLD, GERD, obesity and diabetes. She has been diabetic since the mid 80's. Mother had MI at 56. Father had stroke at 54. She has had anemia as well.   Seen earlier this month by primary care - endorsed chest pain and palpitations. Referred here for further evaluation.  Comes in today. Here alone.  She notes a squeezing type chest pain in the right chest with radiation down the right arm over the past 3 weeks - not getting any worse but not getting better. Will last for a few seconds. Short of breath with "any activity" and has to stop for frequent rest periods.  Lots of fatigue. Admits she is not as active due to knee issues. 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 No palpitations. She does have some dizziness at times. Has not had her medicines today - says she does not typically take until about 9 to 10 AM. Looks like her Pravachol was just increased to 40 mg. LDL not at goal.   Past Medical History  Diagnosis Date  . Diabetes mellitus   . Hypertension   . Hyperlipidemia   . Obesity   . GERD (gastroesophageal reflux disease)   . Anemia   . Blood transfusion without reported diagnosis   . Cataract   . Arthritis     Past Surgical History  Procedure Laterality Date  . Bariatric surgery  1984  . Ectopic pregnancy surgery    . Appendectomy  1985  . Cholecystectomy  1984  . Abdominal hysterectomy  1988     Medications: Current Outpatient Prescriptions  Medication Sig Dispense Refill  . aspirin 81 MG tablet Take 81 mg by mouth daily.    [name]  doxazosin (CARDURA) 4 MG tablet Take 1 tablet (4 mg total) by mouth at bedtime. 90 tablet 1  . glipiZIDE (GLUCOTROL) 5 MG tablet Take 1 tablet (5 mg total) by mouth 2 (two) times daily before a meal. 180 tablet 0  . Insulin Pen Needle 31G X 5 MM MISC 1 each by Does not apply route daily as needed. 100 each 6  . Liraglutide (VICTOZA) 18 MG/3ML SOPN Inject 0.2 mLs (1.2 mg total) into the skin daily. 6 pen 1  . losartan (COZAAR) 100 MG tablet TAKE 1 TABLET (100 MG TOTAL) BY MOUTH DAILY. NEED TO SCHEDULE ANNUAL PHYSICAL FOR MORE REFILLS [phone] 30 tablet 1  . pantoprazole (PROTONIX) 40 MG tablet Take 1 tablet (40 mg total) by mouth daily. 30 tablet 3  . pravastatin (PRAVACHOL) 40 MG tablet Take 1 tablet (40 mg total) by mouth daily. 90 tablet 1  . sitaGLIPtin (JANUVIA) 100 MG tablet Take 1 tablet (100 mg total) by mouth daily. 90 tablet 1  . spironolactone (ALDACTONE) 25 MG tablet Take 1 tablet (25 mg total) by mouth daily. 90 tablet 1  . traMADol (ULTRAM) 50 MG tablet Take 1-2 tablets (50-100 mg total) by mouth every 6 (six) hours as needed for pain.  45 tablet 0   No current facility-administered medications for this visit.    Allergies: Allergies  Allergen Reactions  . Iodinated Diagnostic Agents   . Sulfa Antibiotics     Social History: The patient  reports that she has never smoked. She has never used smokeless tobacco. She reports that she does not drink alcohol or use illicit drugs.   Family History: The patient's family history includes Diabetes in her mother, sister, sister, and sister; Heart attack in her father and mother; Heart disease in her mother; Hyperlipidemia in her father and mother; Hypertension in her father and mother; Stomach cancer in her sister; Stroke in her father.   Review of Systems: Please see the history of present illness.   Otherwise, the review of systems is positive for leg swelling, snoring, excessive fatigue, chest pressure, leg pain and snoring.   All  other systems are reviewed and negative.   Physical Exam: VS:  BP 178/106 mmHg  Pulse 73  Ht 5\' 5"  (1.651 m)  Wt 299 lb 3.2 oz (135.716 kg)  BMI 49.79 kg/m2 .  BMI Body mass index is 49.79 kg/(m^2).  Wt Readings from Last 3 Encounters:  10/17/15 299 lb 3.2 oz (135.716 kg)  10/10/15 297 lb (134.718 kg)  09/23/14 292 lb (132.45 kg)    General: Pleasant black female who is morbidly obese but in no acute distress. Looks older than her stated age.  HEENT: Normal. Neck: Supple, no JVD, carotid bruits, or masses noted.  Cardiac: Regular rate and rhythm. No murmurs, rubs, or gallops. No edema.  Respiratory:  Lungs are clear to auscultation bilaterally with normal work of breathing.  GI: Soft and nontender.  MS: No deformity or atrophy. Gait and ROM intact. Skin: Warm and dry. Color is normal.  Neuro:  Strength and sensation are intact and no gross focal deficits noted.  Psych: Alert, appropriate and with normal affect.   LABORATORY DATA:  EKG:  EKG is ordered today. This demonstrates NSR with inferior ischemia. T wave inversion in AVF is new compared to last EKG of 2013 - continues to have T wave inversion in lead III.  Lab Results  Component Value Date   WBC 5.3 10/10/2015   HGB 9.7* 10/10/2015   HCT 31.9* 10/10/2015   PLT 266.0 10/10/2015   GLUCOSE 87 10/10/2015   CHOL 208* 10/10/2015   TRIG 47.0 10/10/2015   HDL 69.10 10/10/2015   LDLCALC 130* 10/10/2015   ALT 11 10/10/2015   AST 18 10/10/2015   NA 139 10/10/2015   K 4.2 10/10/2015   CL 103 10/10/2015   CREATININE 1.32* 10/10/2015   BUN 21 10/10/2015   CO2 29 10/10/2015   TSH 0.71 01/29/2014   INR 0.98 10/12/2011   HGBA1C 6.0 10/10/2015   MICROALBUR 0.5 01/29/2014    BNP (last 3 results) No results for input(s): BNP in the last 8760 hours.  ProBNP (last 3 results) No results for input(s): PROBNP in the last 8760 hours.   Other Studies Reviewed Today:   Assessment/Plan: 1. Chest pain - right sided with  radiation down right arm. Multiple risk factors. She is morbidly obese and carries all her weight in the middle - I do not think stress testing will be adequate given her size/body habitus.   2. Abnormal EKG   3. Multiple CV risk factors  4. HTN - not controlled  5. DM - recent AIC was 6.0.   6. Anemia  7. Dyspnea - will check echo - see  how much diastolic dysfunction she has. Check CXR today as well.   In light of all her symptoms and her size, it is felt that cardiac cath is best to further define. The patient understands that risks include but are not limited to stroke (1 in 1000), death (1 in 21), kidney failure [usually temporary] (1 in 500), bleeding (1 in 200), allergic reaction [possibly serious] (1 in 200), and agrees to proceed. Lab and CXR today. Echo to be scheduled as well.   She is subsequently seen with Dr. Harrington Challenger who is in agreement with this plan.   Current medicines are reviewed with the patient today.  The patient does not have concerns regarding medicines other than what has been noted above.  The following changes have been made:  See above.  Labs/ tests ordered today include:    Orders Placed This Encounter  Procedures  . DG Chest 2 View  . Brain natriuretic peptide  . Basic metabolic panel  . CBC  . Protime-INR  . APTT  . EKG 12-Lead  . ECHOCARDIOGRAM COMPLETE     Disposition:   Further disposition to follow.   Patient is agreeable to this plan and will call if any problems develop in the interim.   Signed: Burtis Junes, RN, ANP-C 10/17/2015 9:03 AM  [place] [address] [place], [place]  [address] Phone: [phone] Fax: [phone]

## 2015-10-19 ENCOUNTER — Other Ambulatory Visit: Payer: Self-pay

## 2015-10-19 DIAGNOSIS — I10 Essential (primary) hypertension: Secondary | ICD-10-CM

## 2015-10-19 MED ORDER — LOSARTAN POTASSIUM 100 MG PO TABS: ORAL_TABLET | ORAL | Status: DC

## 2015-10-20 ENCOUNTER — Other Ambulatory Visit: Payer: Self-pay

## 2015-10-20 ENCOUNTER — Ambulatory Visit (HOSPITAL_COMMUNITY): Payer: Medicare Other | Attending: Internal Medicine

## 2015-10-20 DIAGNOSIS — I358 Other nonrheumatic aortic valve disorders: Secondary | ICD-10-CM | POA: Insufficient documentation

## 2015-10-20 DIAGNOSIS — I517 Cardiomegaly: Secondary | ICD-10-CM | POA: Insufficient documentation

## 2015-10-20 DIAGNOSIS — R0789 Other chest pain: Secondary | ICD-10-CM | POA: Diagnosis not present

## 2015-10-20 DIAGNOSIS — R079 Chest pain, unspecified: Secondary | ICD-10-CM | POA: Diagnosis present

## 2015-10-20 DIAGNOSIS — I071 Rheumatic tricuspid insufficiency: Secondary | ICD-10-CM | POA: Diagnosis not present

## 2015-10-20 DIAGNOSIS — R0600 Dyspnea, unspecified: Secondary | ICD-10-CM

## 2015-10-20 DIAGNOSIS — I10 Essential (primary) hypertension: Secondary | ICD-10-CM

## 2015-10-20 DIAGNOSIS — I5189 Other ill-defined heart diseases: Secondary | ICD-10-CM | POA: Diagnosis not present

## 2015-10-20 DIAGNOSIS — I059 Rheumatic mitral valve disease, unspecified: Secondary | ICD-10-CM | POA: Insufficient documentation

## 2015-10-20 DIAGNOSIS — E785 Hyperlipidemia, unspecified: Secondary | ICD-10-CM | POA: Diagnosis not present

## 2015-10-20 DIAGNOSIS — I272 Other secondary pulmonary hypertension: Secondary | ICD-10-CM | POA: Diagnosis not present

## 2015-10-21 ENCOUNTER — Ambulatory Visit (HOSPITAL_COMMUNITY)
Admission: RE | Admit: 2015-10-21 | Discharge: 2015-10-22 | Disposition: A | Discharge status: Home / Self Care | Payer: Medicare Other | Source: Ambulatory Visit | Attending: Cardiovascular Disease | Admitting: Cardiovascular Disease

## 2015-10-21 ENCOUNTER — Ambulatory Visit (HOSPITAL_BASED_OUTPATIENT_CLINIC_OR_DEPARTMENT_OTHER): Payer: Medicare Other

## 2015-10-21 ENCOUNTER — Encounter (HOSPITAL_COMMUNITY)
Admission: RE | Disposition: A | Discharge status: Home / Self Care | Payer: Self-pay | Source: Ambulatory Visit | Attending: Cardiovascular Disease

## 2015-10-21 ENCOUNTER — Encounter (HOSPITAL_COMMUNITY): Payer: Self-pay | Admitting: Cardiovascular Disease

## 2015-10-21 DIAGNOSIS — R079 Chest pain, unspecified: Secondary | ICD-10-CM

## 2015-10-21 DIAGNOSIS — Z8249 Family history of ischemic heart disease and other diseases of the circulatory system: Secondary | ICD-10-CM | POA: Diagnosis not present

## 2015-10-21 DIAGNOSIS — K625 Hemorrhage of anus and rectum: Secondary | ICD-10-CM | POA: Diagnosis not present

## 2015-10-21 DIAGNOSIS — I208 Other forms of angina pectoris: Secondary | ICD-10-CM | POA: Diagnosis present

## 2015-10-21 DIAGNOSIS — E119 Type 2 diabetes mellitus without complications: Secondary | ICD-10-CM | POA: Diagnosis not present

## 2015-10-21 DIAGNOSIS — D509 Iron deficiency anemia, unspecified: Secondary | ICD-10-CM

## 2015-10-21 DIAGNOSIS — Z6841 Body Mass Index (BMI) 40.0 and over, adult: Secondary | ICD-10-CM | POA: Insufficient documentation

## 2015-10-21 DIAGNOSIS — I119 Hypertensive heart disease without heart failure: Secondary | ICD-10-CM | POA: Diagnosis not present

## 2015-10-21 DIAGNOSIS — E785 Hyperlipidemia, unspecified: Secondary | ICD-10-CM | POA: Insufficient documentation

## 2015-10-21 DIAGNOSIS — I25110 Atherosclerotic heart disease of native coronary artery with unstable angina pectoris: Secondary | ICD-10-CM | POA: Insufficient documentation

## 2015-10-21 DIAGNOSIS — Z79899 Other long term (current) drug therapy: Secondary | ICD-10-CM | POA: Insufficient documentation

## 2015-10-21 DIAGNOSIS — I2089 Other forms of angina pectoris: Secondary | ICD-10-CM | POA: Diagnosis present

## 2015-10-21 DIAGNOSIS — I25118 Atherosclerotic heart disease of native coronary artery with other forms of angina pectoris: Secondary | ICD-10-CM

## 2015-10-21 DIAGNOSIS — Z7982 Long term (current) use of aspirin: Secondary | ICD-10-CM | POA: Insufficient documentation

## 2015-10-21 DIAGNOSIS — R0602 Shortness of breath: Secondary | ICD-10-CM

## 2015-10-21 DIAGNOSIS — K219 Gastro-esophageal reflux disease without esophagitis: Secondary | ICD-10-CM | POA: Diagnosis not present

## 2015-10-21 DIAGNOSIS — Z794 Long term (current) use of insulin: Secondary | ICD-10-CM | POA: Diagnosis not present

## 2015-10-21 DIAGNOSIS — I200 Unstable angina: Secondary | ICD-10-CM

## 2015-10-21 DIAGNOSIS — I10 Essential (primary) hypertension: Secondary | ICD-10-CM

## 2015-10-21 DIAGNOSIS — Z9861 Coronary angioplasty status: Secondary | ICD-10-CM

## 2015-10-21 DIAGNOSIS — I2510 Atherosclerotic heart disease of native coronary artery without angina pectoris: Secondary | ICD-10-CM

## 2015-10-21 HISTORY — PX: CARDIAC CATHETERIZATION: SHX172

## 2015-10-21 HISTORY — DX: Essential (primary) hypertension: I10

## 2015-10-21 HISTORY — DX: Atherosclerotic heart disease of native coronary artery without angina pectoris: I25.10

## 2015-10-21 HISTORY — DX: Morbid (severe) obesity due to excess calories: E66.01

## 2015-10-21 HISTORY — DX: Family history of other specified conditions: Z84.89

## 2015-10-21 HISTORY — DX: Hypertensive heart disease without heart failure: I11.9

## 2015-10-21 HISTORY — DX: Type 2 diabetes mellitus without complications: E11.9

## 2015-10-21 HISTORY — DX: Personal history of other medical treatment: Z92.89

## 2015-10-21 LAB — GLUCOSE, CAPILLARY
GLUCOSE-CAPILLARY: 132 mg/dL — AB (ref 65–99)
GLUCOSE-CAPILLARY: 202 mg/dL — AB (ref 65–99)
Glucose-Capillary: 73 mg/dL (ref 65–99)
Glucose-Capillary: 180 mg/dL — ABNORMAL HIGH (ref 65–99)

## 2015-10-21 LAB — POCT I-STAT 3, VENOUS BLOOD GAS (G3P V)
Acid-Base Excess: 1 mmol/L (ref 0.0–2.0)
BICARBONATE: 25.7 meq/L — AB (ref 20.0–24.0)
Bicarbonate: 27.1 mEq/L — ABNORMAL HIGH (ref 20.0–24.0)
O2 SAT: 76 %
O2 Saturation: 78 %
PCO2 VEN: 47.2 mmHg (ref 45.0–50.0)
PCO2 VEN: 47.2 mmHg (ref 45.0–50.0)
PH VEN: 7.344 — AB (ref 7.250–7.300)
PH VEN: 7.367 — AB (ref 7.250–7.300)
PO2 VEN: 42 mmHg (ref 30.0–45.0)
PO2 VEN: 45 mmHg (ref 30.0–45.0)
TCO2: 27 mmol/L (ref 0–100)
TCO2: 29 mmol/L (ref 0–100)

## 2015-10-21 LAB — POCT I-STAT 3, ART BLOOD GAS (G3+)
ACID-BASE EXCESS: 1 mmol/L (ref 0.0–2.0)
BICARBONATE: 25.7 meq/L — AB (ref 20.0–24.0)
O2 Saturation: 99 %
PO2 ART: 120 mmHg — AB (ref 80.0–100.0)
TCO2: 27 mmol/L (ref 0–100)
pCO2 arterial: 39.1 mmHg (ref 35.0–45.0)
pH, Arterial: 7.426 (ref 7.350–7.450)

## 2015-10-21 LAB — POCT ACTIVATED CLOTTING TIME
Activated Clotting Time: 193 seconds
Activated Clotting Time: 435 seconds

## 2015-10-21 SURGERY — RIGHT/LEFT HEART CATH AND CORONARY ANGIOGRAPHY

## 2015-10-21 MED ORDER — ALPRAZOLAM 0.25 MG PO TABS: 0.2500 mg | ORAL_TABLET | Freq: Once | ORAL | Status: AC

## 2015-10-21 MED ORDER — INSULIN ASPART 100 UNIT/ML ~~LOC~~ SOLN
0.0000 [IU] | Freq: Three times a day (TID) | SUBCUTANEOUS | Status: DC
  Administered 2015-10-21: 5 [IU] via SUBCUTANEOUS
  Administered 2015-10-21: 2 [IU] via SUBCUTANEOUS

## 2015-10-21 MED ORDER — SPIRONOLACTONE 25 MG PO TABS
25.0000 mg | ORAL_TABLET | Freq: Every day | ORAL | Status: DC
  Administered 2015-10-22: 12:00:00 25 mg via ORAL
  Filled 2015-10-21: qty 1

## 2015-10-21 MED ORDER — DIPHENHYDRAMINE HCL 50 MG/ML IJ SOLN: 25.0000 mg | INTRAMUSCULAR | Status: DC

## 2015-10-21 MED ORDER — LIDOCAINE HCL (PF) 1 % IJ SOLN
INTRAMUSCULAR | Status: AC
  Filled 2015-10-21: qty 30

## 2015-10-21 MED ORDER — SODIUM CHLORIDE 0.9 % IV SOLN: 250.0000 mL | INTRAVENOUS | Status: DC | PRN

## 2015-10-21 MED ORDER — HYDRALAZINE HCL 20 MG/ML IJ SOLN
INTRAMUSCULAR | Status: AC
  Filled 2015-10-21: qty 1

## 2015-10-21 MED ORDER — HEPARIN (PORCINE) IN NACL 2-0.9 UNIT/ML-% IJ SOLN
INTRAMUSCULAR | Status: AC
  Filled 2015-10-21: qty 1000

## 2015-10-21 MED ORDER — METOPROLOL TARTRATE 25 MG PO TABS
25.0000 mg | ORAL_TABLET | Freq: Two times a day (BID) | ORAL | Status: DC
  Administered 2015-10-21: 25 mg via ORAL
  Filled 2015-10-21: qty 1

## 2015-10-21 MED ORDER — GLIPIZIDE 5 MG PO TABS
5.0000 mg | ORAL_TABLET | Freq: Two times a day (BID) | ORAL | Status: DC
  Administered 2015-10-22: 5 mg via ORAL
  Filled 2015-10-21 (×4): qty 1

## 2015-10-21 MED ORDER — MIDAZOLAM HCL 2 MG/2ML IJ SOLN
INTRAMUSCULAR | Status: AC
  Filled 2015-10-21: qty 2

## 2015-10-21 MED ORDER — SODIUM CHLORIDE 0.9 % IJ SOLN: 3.0000 mL | INTRAMUSCULAR | Status: DC | PRN

## 2015-10-21 MED ORDER — METHYLPREDNISOLONE SODIUM SUCC 125 MG IJ SOLR: 125.0000 mg | INTRAMUSCULAR | Status: DC

## 2015-10-21 MED ORDER — BIVALIRUDIN 250 MG IV SOLR
250.0000 mg | INTRAVENOUS | Status: DC | PRN
  Administered 2015-10-21 (×2): 1.75 mg/kg/h via INTRAVENOUS

## 2015-10-21 MED ORDER — CLOPIDOGREL BISULFATE 300 MG PO TABS
ORAL_TABLET | ORAL | Status: AC
Start: 2015-10-21 — End: 2015-10-21
  Filled 2015-10-21: qty 1

## 2015-10-21 MED ORDER — BIVALIRUDIN 250 MG IV SOLR
INTRAVENOUS | Status: AC
  Filled 2015-10-21: qty 250

## 2015-10-21 MED ORDER — FAMOTIDINE IN NACL 20-0.9 MG/50ML-% IV SOLN
INTRAVENOUS | Status: AC
  Administered 2015-10-21: 20 mg
  Filled 2015-10-21: qty 50

## 2015-10-21 MED ORDER — HYDRALAZINE HCL 20 MG/ML IJ SOLN
INTRAMUSCULAR | Status: DC | PRN
  Administered 2015-10-21 (×2): 10 mg via INTRAVENOUS

## 2015-10-21 MED ORDER — ASPIRIN 81 MG PO CHEW
CHEWABLE_TABLET | ORAL | Status: AC
  Administered 2015-10-21: 81 mg
  Filled 2015-10-21: qty 1

## 2015-10-21 MED ORDER — LINAGLIPTIN 5 MG PO TABS
5.0000 mg | ORAL_TABLET | Freq: Every day | ORAL | Status: DC
  Administered 2015-10-22: 09:00:00 5 mg via ORAL
  Filled 2015-10-21: qty 1

## 2015-10-21 MED ORDER — METHYLPREDNISOLONE SODIUM SUCC 125 MG IJ SOLR
INTRAMUSCULAR | Status: AC
  Administered 2015-10-21: 125 mg
  Filled 2015-10-21: qty 2

## 2015-10-21 MED ORDER — LABETALOL HCL 5 MG/ML IV SOLN
INTRAVENOUS | Status: DC | PRN
  Administered 2015-10-21: 20 mg via INTRAVENOUS

## 2015-10-21 MED ORDER — HEPARIN SODIUM (PORCINE) 1000 UNIT/ML IJ SOLN
INTRAMUSCULAR | Status: DC | PRN
  Administered 2015-10-21: 5000 [IU] via INTRAVENOUS

## 2015-10-21 MED ORDER — MIDAZOLAM HCL 2 MG/2ML IJ SOLN
INTRAMUSCULAR | Status: DC | PRN
  Administered 2015-10-21 (×2): 2 mg via INTRAVENOUS

## 2015-10-21 MED ORDER — IOHEXOL 350 MG/ML SOLN
INTRAVENOUS | Status: DC | PRN
  Administered 2015-10-21: 150 mL via INTRACARDIAC

## 2015-10-21 MED ORDER — NITROGLYCERIN 1 MG/10 ML FOR IR/CATH LAB: INTRA_ARTERIAL | Status: DC | PRN

## 2015-10-21 MED ORDER — LABETALOL HCL 5 MG/ML IV SOLN
INTRAVENOUS | Status: AC
  Filled 2015-10-21: qty 4

## 2015-10-21 MED ORDER — TRAMADOL HCL 50 MG PO TABS: 50.0000 mg | ORAL_TABLET | Freq: Four times a day (QID) | ORAL | Status: DC | PRN

## 2015-10-21 MED ORDER — BIVALIRUDIN BOLUS VIA INFUSION - CUPID
INTRAVENOUS | Status: DC | PRN
  Administered 2015-10-21: 101.7 mg via INTRAVENOUS

## 2015-10-21 MED ORDER — SODIUM CHLORIDE 0.9 % IV SOLN
INTRAVENOUS | Status: DC
  Administered 2015-10-21: 11:00:00 via INTRAVENOUS

## 2015-10-21 MED ORDER — DOXAZOSIN MESYLATE 4 MG PO TABS
4.0000 mg | ORAL_TABLET | Freq: Every day | ORAL | Status: DC
  Administered 2015-10-21: 4 mg via ORAL
  Filled 2015-10-21 (×3): qty 1

## 2015-10-21 MED ORDER — SODIUM CHLORIDE 0.9 % IJ SOLN: 3.0000 mL | Freq: Two times a day (BID) | INTRAMUSCULAR | Status: DC

## 2015-10-21 MED ORDER — CLOPIDOGREL BISULFATE 75 MG PO TABS
75.0000 mg | ORAL_TABLET | Freq: Every day | ORAL | Status: DC
  Administered 2015-10-22: 09:00:00 75 mg via ORAL
  Filled 2015-10-21: qty 1

## 2015-10-21 MED ORDER — ANGIOPLASTY BOOK
Freq: Once | Status: AC
  Administered 2015-10-21: 22:00:00
  Filled 2015-10-21: qty 1

## 2015-10-21 MED ORDER — DIPHENHYDRAMINE HCL 50 MG/ML IJ SOLN
INTRAMUSCULAR | Status: AC
  Administered 2015-10-21: 25 mg
  Filled 2015-10-21: qty 1

## 2015-10-21 MED ORDER — LABETALOL HCL 5 MG/ML IV SOLN: 10.0000 mg | Freq: Once | INTRAVENOUS | Status: DC

## 2015-10-21 MED ORDER — LOSARTAN POTASSIUM 50 MG PO TABS
100.0000 mg | ORAL_TABLET | Freq: Every day | ORAL | Status: DC
  Administered 2015-10-22: 09:00:00 100 mg via ORAL
  Filled 2015-10-21: qty 2

## 2015-10-21 MED ORDER — PRAVASTATIN SODIUM 40 MG PO TABS
40.0000 mg | ORAL_TABLET | Freq: Every day | ORAL | Status: DC
  Administered 2015-10-21: 40 mg via ORAL
  Filled 2015-10-21 (×2): qty 1

## 2015-10-21 MED ORDER — ACETAMINOPHEN 325 MG PO TABS: 650.0000 mg | ORAL_TABLET | ORAL | Status: DC | PRN

## 2015-10-21 MED ORDER — CLOPIDOGREL BISULFATE 300 MG PO TABS
ORAL_TABLET | ORAL | Status: DC | PRN
  Administered 2015-10-21: 600 mg via ORAL

## 2015-10-21 MED ORDER — ONDANSETRON HCL 4 MG/2ML IJ SOLN: 4.0000 mg | Freq: Four times a day (QID) | INTRAMUSCULAR | Status: DC | PRN

## 2015-10-21 MED ORDER — SODIUM CHLORIDE 0.9 % IV SOLN
INTRAVENOUS | Status: DC
  Administered 2015-10-21: 07:00:00 via INTRAVENOUS

## 2015-10-21 MED ORDER — LIRAGLUTIDE 18 MG/3ML ~~LOC~~ SOPN: 0.2000 mL | PEN_INJECTOR | Freq: Every day | SUBCUTANEOUS | Status: DC

## 2015-10-21 MED ORDER — NITROGLYCERIN 1 MG/10 ML FOR IR/CATH LAB
INTRA_ARTERIAL | Status: DC | PRN
  Administered 2015-10-21: 10:00:00

## 2015-10-21 MED ORDER — PANTOPRAZOLE SODIUM 40 MG PO TBEC
40.0000 mg | DELAYED_RELEASE_TABLET | Freq: Every day | ORAL | Status: DC
  Administered 2015-10-21 – 2015-10-22 (×2): 40 mg via ORAL
  Filled 2015-10-21 (×2): qty 1

## 2015-10-21 MED ORDER — FAMOTIDINE IN NACL 20-0.9 MG/50ML-% IV SOLN: 20.0000 mg | INTRAVENOUS | Status: DC

## 2015-10-21 MED ORDER — MORPHINE SULFATE (PF) 2 MG/ML IV SOLN: 2.0000 mg | INTRAVENOUS | Status: DC | PRN

## 2015-10-21 MED ORDER — ASPIRIN 81 MG PO CHEW
81.0000 mg | CHEWABLE_TABLET | Freq: Every day | ORAL | Status: DC
  Administered 2015-10-22: 81 mg via ORAL
  Filled 2015-10-21 (×2): qty 1

## 2015-10-21 MED ORDER — LIDOCAINE HCL (PF) 1 % IJ SOLN
INTRAMUSCULAR | Status: DC | PRN
  Administered 2015-10-21: 09:00:00

## 2015-10-21 MED ORDER — VERAPAMIL HCL 2.5 MG/ML IV SOLN
INTRAVENOUS | Status: DC | PRN
  Administered 2015-10-21: 09:00:00 via INTRA_ARTERIAL

## 2015-10-21 MED ORDER — LABETALOL HCL 5 MG/ML IV SOLN
10.0000 mg | Freq: Once | INTRAVENOUS | Status: AC
  Administered 2015-10-21: 10 mg via INTRAVENOUS

## 2015-10-21 MED ORDER — CLOPIDOGREL BISULFATE 300 MG PO TABS
ORAL_TABLET | ORAL | Status: AC
  Filled 2015-10-21: qty 1

## 2015-10-21 MED ORDER — ASPIRIN 81 MG PO CHEW: 81.0000 mg | CHEWABLE_TABLET | ORAL | Status: DC

## 2015-10-21 MED ORDER — FENTANYL CITRATE (PF) 100 MCG/2ML IJ SOLN
INTRAMUSCULAR | Status: DC | PRN
  Administered 2015-10-21 (×2): 25 ug via INTRAVENOUS

## 2015-10-21 MED ORDER — ALPRAZOLAM 0.25 MG PO TABS: 0.2500 mg | ORAL_TABLET | Freq: Once | ORAL | Status: DC

## 2015-10-21 MED ORDER — CARVEDILOL 12.5 MG PO TABS
25.0000 mg | ORAL_TABLET | Freq: Two times a day (BID) | ORAL | Status: DC
  Filled 2015-10-21: qty 2

## 2015-10-21 MED ORDER — HEPARIN SODIUM (PORCINE) 1000 UNIT/ML IJ SOLN
INTRAMUSCULAR | Status: AC
  Filled 2015-10-21: qty 1

## 2015-10-21 MED ORDER — FENTANYL CITRATE (PF) 100 MCG/2ML IJ SOLN
INTRAMUSCULAR | Status: AC
  Filled 2015-10-21: qty 2

## 2015-10-21 MED ORDER — VERAPAMIL HCL 2.5 MG/ML IV SOLN
INTRAVENOUS | Status: AC
  Filled 2015-10-21: qty 2

## 2015-10-21 SURGICAL SUPPLY — 34 items
BALLN EUPHORA RX 3.0X12 (BALLOONS) ×2
BALLN ~~LOC~~ EUPHORA RX 4.5X12 (BALLOONS) ×2
BALLOON EUPHORA RX 3.0X12 (BALLOONS) IMPLANT
BALLOON ~~LOC~~ EUPHORA RX 4.5X12 (BALLOONS) IMPLANT
CATH BALLN WEDGE 5F 110CM (CATHETERS) ×1 IMPLANT
CATH INFINITI 5 FR JL3.5 (CATHETERS) ×2 IMPLANT
CATH INFINITI 5FR AL1 (CATHETERS) ×1 IMPLANT
CATH INFINITI 5FR ANG PIGTAIL (CATHETERS) ×2 IMPLANT
CATH INFINITI 5FR MULTPACK ANG (CATHETERS) IMPLANT
CATH INFINITI JR4 5F (CATHETERS) ×2 IMPLANT
CATH LAUNCHER 5F JR4 (CATHETERS) ×1 IMPLANT
CATH SWAN GANZ 7F STRAIGHT (CATHETERS) IMPLANT
DEVICE RAD COMP TR BAND LRG (VASCULAR PRODUCTS) ×2 IMPLANT
GLIDESHEATH SLEND SS 6F .021 (SHEATH) ×3 IMPLANT
GUIDE CATH RUNWAY 6FR AL 1 (CATHETERS) ×1 IMPLANT
GUIDE CATH RUNWAY 6FR AL 75 (CATHETERS) ×1 IMPLANT
GUIDEWIRE .025 260CM (WIRE) ×1 IMPLANT
HOVERMATT SINGLE USE (MISCELLANEOUS) ×1 IMPLANT
KIT ENCORE 26 ADVANTAGE (KITS) ×1 IMPLANT
KIT HEART LEFT (KITS) ×2 IMPLANT
KIT HEART RIGHT NAMIC (KITS) ×1 IMPLANT
PACK CARDIAC CATHETERIZATION (CUSTOM PROCEDURE TRAY) ×2 IMPLANT
SHEATH FAST CATH BRACH 5F 5CM (SHEATH) ×1 IMPLANT
SHEATH PINNACLE 5F 10CM (SHEATH) IMPLANT
SHEATH PINNACLE 7F 10CM (SHEATH) IMPLANT
STENT SYNERGY DES 4X16 (Permanent Stent) ×1 IMPLANT
SYR MEDRAD MARK V 150ML (SYRINGE) ×2 IMPLANT
TRANSDUCER W/STOPCOCK (MISCELLANEOUS) ×3 IMPLANT
TUBING ART PRESS 72  MALE/FEM (TUBING) ×1
TUBING ART PRESS 72 MALE/FEM (TUBING) IMPLANT
TUBING CIL FLEX 10 FLL-RA (TUBING) ×3 IMPLANT
WIRE COUGAR XT STRL 190CM (WIRE) ×1 IMPLANT
WIRE EMERALD 3MM-J .035X150CM (WIRE) IMPLANT
WIRE SAFE-T 1.5MM-J .035X260CM (WIRE) ×2 IMPLANT

## 2015-10-21 NOTE — Interval H&P Note (Signed)
Cath Lab Visit (complete for each Cath Lab visit)  Clinical Evaluation Leading to the Procedure:   ACS: No.  Non-ACS:    Anginal Classification: CCS III  Anti-ischemic medical therapy: Minimal Therapy (1 class of medications)  Non-Invasive Test Results: No non-invasive testing performed  Prior CABG: No previous CABG      History and Physical Interval Note:  10/21/2015 8:19 AM  Alicia [name]  has presented today for surgery, with the diagnosis of cp  The various methods of treatment have been discussed with the patient and family. After consideration of risks, benefits and other options for treatment, the patient has consented to  Procedure(s): Right/Left Heart Cath and Coronary Angiography (N/A) as a surgical intervention .  The patient's history has been reviewed, patient examined, no change in status, stable for surgery.  I have reviewed the patient's chart and labs.  Questions were answered to the patient's satisfaction.     Alicia [name]

## 2015-10-21 NOTE — H&P (View-Only) (Signed)
CARDIOLOGY OFFICE NOTE  Date:  10/17/2015    [name] Date of Birth: 1946-06-13 Medical Record [id number]  PCP:  Webb Silversmith, NP  Cardiologist:  Harrington Challenger (NEW)    Chief Complaint  Patient presents with  . Chest Pain    New patient visit - seen for Dr. Harrington Challenger (DOD)    History of Present Illness: Alicia [name] is a 69 y.o. female who presents today for a new patient visit. Seen for Dr. Harrington Challenger (DOD today). She has a history of HTN, HLD, GERD, obesity and diabetes. She has been diabetic since the mid 80's. Mother had MI at 78. Father had stroke at 6. She has had anemia as well.   Seen earlier this month by primary care - endorsed chest pain and palpitations. Referred here for further evaluation.  Comes in today. Here alone.  She notes a squeezing type chest pain in the right chest with radiation down the right arm over the past 3 weeks - not getting any worse but not getting better. Will last for a few seconds. Short of breath with "any activity" and has to stop for frequent rest periods.  Lots of fatigue. Admits she is not as active due to knee issues. 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 No palpitations. She does have some dizziness at times. Has not had her medicines today - says she does not typically take until about 9 to 10 AM. Looks like her Pravachol was just increased to 40 mg. LDL not at goal.   Past Medical History  Diagnosis Date  . Diabetes mellitus   . Hypertension   . Hyperlipidemia   . Obesity   . GERD (gastroesophageal reflux disease)   . Anemia   . Blood transfusion without reported diagnosis   . Cataract   . Arthritis     Past Surgical History  Procedure Laterality Date  . Bariatric surgery  1984  . Ectopic pregnancy surgery    . Appendectomy  1985  . Cholecystectomy  1984  . Abdominal hysterectomy  1988     Medications: Current Outpatient Prescriptions  Medication Sig Dispense Refill  . aspirin 81 MG tablet Take 81 mg by mouth daily.    [name]  doxazosin (CARDURA) 4 MG tablet Take 1 tablet (4 mg total) by mouth at bedtime. 90 tablet 1  . glipiZIDE (GLUCOTROL) 5 MG tablet Take 1 tablet (5 mg total) by mouth 2 (two) times daily before a meal. 180 tablet 0  . Insulin Pen Needle 31G X 5 MM MISC 1 each by Does not apply route daily as needed. 100 each 6  . Liraglutide (VICTOZA) 18 MG/3ML SOPN Inject 0.2 mLs (1.2 mg total) into the skin daily. 6 pen 1  . losartan (COZAAR) 100 MG tablet TAKE 1 TABLET (100 MG TOTAL) BY MOUTH DAILY. NEED TO SCHEDULE ANNUAL PHYSICAL FOR MORE REFILLS [phone] 30 tablet 1  . pantoprazole (PROTONIX) 40 MG tablet Take 1 tablet (40 mg total) by mouth daily. 30 tablet 3  . pravastatin (PRAVACHOL) 40 MG tablet Take 1 tablet (40 mg total) by mouth daily. 90 tablet 1  . sitaGLIPtin (JANUVIA) 100 MG tablet Take 1 tablet (100 mg total) by mouth daily. 90 tablet 1  . spironolactone (ALDACTONE) 25 MG tablet Take 1 tablet (25 mg total) by mouth daily. 90 tablet 1  . traMADol (ULTRAM) 50 MG tablet Take 1-2 tablets (50-100 mg total) by mouth every 6 (six) hours as needed for pain.  45 tablet 0   No current facility-administered medications for this visit.    Allergies: Allergies  Allergen Reactions  . Iodinated Diagnostic Agents   . Sulfa Antibiotics     Social History: The patient  reports that she has never smoked. She has never used smokeless tobacco. She reports that she does not drink alcohol or use illicit drugs.   Family History: The patient's family history includes Diabetes in her mother, sister, sister, and sister; Heart attack in her father and mother; Heart disease in her mother; Hyperlipidemia in her father and mother; Hypertension in her father and mother; Stomach cancer in her sister; Stroke in her father.   Review of Systems: Please see the history of present illness.   Otherwise, the review of systems is positive for leg swelling, snoring, excessive fatigue, chest pressure, leg pain and snoring.   All  other systems are reviewed and negative.   Physical Exam: VS:  BP 178/106 mmHg  Pulse 73  Ht 5\' 5"  (1.651 m)  Wt 299 lb 3.2 oz (135.716 kg)  BMI 49.79 kg/m2 .  BMI Body mass index is 49.79 kg/(m^2).  Wt Readings from Last 3 Encounters:  10/17/15 299 lb 3.2 oz (135.716 kg)  10/10/15 297 lb (134.718 kg)  09/23/14 292 lb (132.45 kg)    General: Pleasant black female who is morbidly obese but in no acute distress. Looks older than her stated age.  HEENT: Normal. Neck: Supple, no JVD, carotid bruits, or masses noted.  Cardiac: Regular rate and rhythm. No murmurs, rubs, or gallops. No edema.  Respiratory:  Lungs are clear to auscultation bilaterally with normal work of breathing.  GI: Soft and nontender.  MS: No deformity or atrophy. Gait and ROM intact. Skin: Warm and dry. Color is normal.  Neuro:  Strength and sensation are intact and no gross focal deficits noted.  Psych: Alert, appropriate and with normal affect.   LABORATORY DATA:  EKG:  EKG is ordered today. This demonstrates NSR with inferior ischemia. T wave inversion in AVF is new compared to last EKG of 2013 - continues to have T wave inversion in lead III.  Lab Results  Component Value Date   WBC 5.3 10/10/2015   HGB 9.7* 10/10/2015   HCT 31.9* 10/10/2015   PLT 266.0 10/10/2015   GLUCOSE 87 10/10/2015   CHOL 208* 10/10/2015   TRIG 47.0 10/10/2015   HDL 69.10 10/10/2015   LDLCALC 130* 10/10/2015   ALT 11 10/10/2015   AST 18 10/10/2015   NA 139 10/10/2015   K 4.2 10/10/2015   CL 103 10/10/2015   CREATININE 1.32* 10/10/2015   BUN 21 10/10/2015   CO2 29 10/10/2015   TSH 0.71 01/29/2014   INR 0.98 10/12/2011   HGBA1C 6.0 10/10/2015   MICROALBUR 0.5 01/29/2014    BNP (last 3 results) No results for input(s): BNP in the last 8760 hours.  ProBNP (last 3 results) No results for input(s): PROBNP in the last 8760 hours.   Other Studies Reviewed Today:   Assessment/Plan: 1. Chest pain - right sided with  radiation down right arm. Multiple risk factors. She is morbidly obese and carries all her weight in the middle - I do not think stress testing will be adequate given her size/body habitus.   2. Abnormal EKG   3. Multiple CV risk factors  4. HTN - not controlled  5. DM - recent AIC was 6.0.   6. Anemia  7. Dyspnea - will check echo - see  how much diastolic dysfunction she has. Check CXR today as well.   In light of all her symptoms and her size, it is felt that cardiac cath is best to further define. The patient understands that risks include but are not limited to stroke (1 in 1000), death (1 in 66), kidney failure [usually temporary] (1 in 500), bleeding (1 in 200), allergic reaction [possibly serious] (1 in 200), and agrees to proceed. Lab and CXR today. Echo to be scheduled as well.   She is subsequently seen with Dr. Harrington Challenger who is in agreement with this plan.   Current medicines are reviewed with the patient today.  The patient does not have concerns regarding medicines other than what has been noted above.  The following changes have been made:  See above.  Labs/ tests ordered today include:    Orders Placed This Encounter  Procedures  . DG Chest 2 View  . Brain natriuretic peptide  . Basic metabolic panel  . CBC  . Protime-INR  . APTT  . EKG 12-Lead  . ECHOCARDIOGRAM COMPLETE     Disposition:   Further disposition to follow.   Patient is agreeable to this plan and will call if any problems develop in the interim.   Signed: Burtis Junes, RN, ANP-C 10/17/2015 9:03 AM  [facility] [address] [place], [place]  [address] Phone: [phone] Fax: [phone]

## 2015-10-21 NOTE — Progress Notes (Addendum)
Cardiology Overnight  Called re: BP of 204/83.  Asymptomatic.  Patient has received metop 25 mg post cath.  Will give 10 IV labetalol now.    Target BP of 160/100 tonight.  Will d/c metoprolol for tomorrow and change to coreg 25 bid.  Terressa Koyanagi, MD Cardiology   Addendum: Still with elevated pressure and now anixous.  Will give a dose of xanax now.  And will give second dose of labeatlol 10 IV 30 minutes after if still SBP > 190.

## 2015-10-21 NOTE — Progress Notes (Signed)
  Echocardiogram 2D Echocardiogram has been performed.  Darlina Sicilian M 10/21/2015, 4:19 PM

## 2015-10-21 NOTE — Progress Notes (Signed)
SBP > 200 post cath, looking back, her SBP was 170s a few days ago when she was seen in the clinic. Will add 25mg  metoprolol BID. Once she takes her morning losartan tomorrow, hopefully her BP will be better. If still high, consider Imdur and amlodipine.  Alicia Corrigan PA Pager: [phone]

## 2015-10-22 ENCOUNTER — Other Ambulatory Visit: Payer: Self-pay

## 2015-10-22 ENCOUNTER — Encounter (HOSPITAL_COMMUNITY): Payer: Self-pay | Admitting: Physician Assistant

## 2015-10-22 DIAGNOSIS — D509 Iron deficiency anemia, unspecified: Secondary | ICD-10-CM

## 2015-10-22 DIAGNOSIS — I119 Hypertensive heart disease without heart failure: Secondary | ICD-10-CM | POA: Diagnosis not present

## 2015-10-22 DIAGNOSIS — I10 Essential (primary) hypertension: Secondary | ICD-10-CM

## 2015-10-22 DIAGNOSIS — Z6841 Body Mass Index (BMI) 40.0 and over, adult: Secondary | ICD-10-CM | POA: Diagnosis not present

## 2015-10-22 DIAGNOSIS — Z9861 Coronary angioplasty status: Secondary | ICD-10-CM

## 2015-10-22 DIAGNOSIS — I208 Other forms of angina pectoris: Secondary | ICD-10-CM

## 2015-10-22 DIAGNOSIS — E785 Hyperlipidemia, unspecified: Secondary | ICD-10-CM | POA: Diagnosis not present

## 2015-10-22 DIAGNOSIS — I200 Unstable angina: Secondary | ICD-10-CM

## 2015-10-22 DIAGNOSIS — I25110 Atherosclerotic heart disease of native coronary artery with unstable angina pectoris: Secondary | ICD-10-CM | POA: Diagnosis not present

## 2015-10-22 DIAGNOSIS — I2510 Atherosclerotic heart disease of native coronary artery without angina pectoris: Secondary | ICD-10-CM

## 2015-10-22 LAB — GLUCOSE, CAPILLARY
GLUCOSE-CAPILLARY: 76 mg/dL (ref 65–99)
Glucose-Capillary: 105 mg/dL — ABNORMAL HIGH (ref 65–99)

## 2015-10-22 LAB — BASIC METABOLIC PANEL
Anion gap: 8 (ref 5–15)
BUN: 24 mg/dL — ABNORMAL HIGH (ref 6–20)
CALCIUM: 8.6 mg/dL — AB (ref 8.9–10.3)
CHLORIDE: 108 mmol/L (ref 101–111)
CO2: 22 mmol/L (ref 22–32)
CREATININE: 1.09 mg/dL — AB (ref 0.44–1.00)
GFR calc Af Amer: 59 mL/min — ABNORMAL LOW (ref >60)
GFR calc non Af Amer: 51 mL/min — ABNORMAL LOW (ref >60)
GLUCOSE: 128 mg/dL — AB (ref 65–99)
Potassium: 3.7 mmol/L (ref 3.5–5.1)
Sodium: 138 mmol/L (ref 135–145)

## 2015-10-22 LAB — CBC
HCT: 29.4 % — ABNORMAL LOW (ref 36.0–46.0)
HEMOGLOBIN: 8.8 g/dL — AB (ref 12.0–15.0)
MCH: 20.7 pg — AB (ref 26.0–34.0)
MCHC: 29.9 g/dL — AB (ref 30.0–36.0)
MCV: 69 fL — AB (ref 78.0–100.0)
PLATELETS: 241 10*3/uL (ref 150–400)
RBC: 4.26 MIL/uL (ref 3.87–5.11)
RDW: 18.8 % — ABNORMAL HIGH (ref 11.5–15.5)
WBC: 7.8 10*3/uL (ref 4.0–10.5)

## 2015-10-22 MED ORDER — NITROGLYCERIN 0.4 MG SL SUBL
0.4000 mg | SUBLINGUAL_TABLET | SUBLINGUAL | Status: DC | PRN
Start: 2015-10-22 — End: 2018-09-16

## 2015-10-22 MED ORDER — CARVEDILOL 3.125 MG PO TABS
6.2500 mg | ORAL_TABLET | Freq: Two times a day (BID) | ORAL | Status: DC
  Administered 2015-10-22: 09:00:00 6.25 mg via ORAL
  Filled 2015-10-22: qty 2

## 2015-10-22 MED ORDER — ATORVASTATIN CALCIUM 80 MG PO TABS: 80.0000 mg | ORAL_TABLET | Freq: Every day | ORAL | Status: DC

## 2015-10-22 MED ORDER — ALPRAZOLAM 0.25 MG PO TABS
0.2500 mg | ORAL_TABLET | Freq: Once | ORAL | Status: AC
  Administered 2015-10-22: 0.25 mg via ORAL
  Filled 2015-10-22: qty 1

## 2015-10-22 MED ORDER — ATORVASTATIN CALCIUM 80 MG PO TABS: 80.0000 mg | ORAL_TABLET | Freq: Every evening | ORAL | Status: DC

## 2015-10-22 MED ORDER — CARVEDILOL 6.25 MG PO TABS: 6.2500 mg | ORAL_TABLET | Freq: Two times a day (BID) | ORAL | Status: DC

## 2015-10-22 MED ORDER — CLOPIDOGREL BISULFATE 75 MG PO TABS: 75.0000 mg | ORAL_TABLET | Freq: Every day | ORAL | Status: DC

## 2015-10-22 NOTE — Progress Notes (Signed)
CARDIAC REHAB PHASE I   PRE:  Rate/Rhythm: 78 SR  BP:  Supine: 191/73  Sitting:   Standing:    SaO2:   MODE:  Ambulation: 500 ft   POST:  Rate/Rhythm: 110 ST  BP:  Supine:   Sitting: 198/73  Standing:    SaO2:  0755-0850 Pt walked 500 ft with steady gait. No CP. Stopped twice to rest. Education completed with pt who voiced understanding. Stressed importance of plavix with stent. Pt has been keeping diabetes controlled and A1C at 6 so reviewed some heart healthy tips and watching sodium due to elevated BP. Dr Marlou Porch in and aware of BP. Discussed CRP 2 and referral to [facility].    Graylon Good, RN BSN  10/22/2015 8:44 AM

## 2015-10-22 NOTE — Progress Notes (Addendum)
BP remains elevated but just got spiro a short while ago. Asymptomatic. Unit closing shortly. Reviewed with Dr. Marlou Porch. He feels we are still OK to dc - anticipate BP will continue to improve at home since we just added a new medicine this AM. (OP BP's were 140-170 recently). Patient will be discharged with instructions to monitor BP at home per prior note and call if remaining elevated. Grover Woodfield PA-C

## 2015-10-22 NOTE — Discharge Summary (Signed)
Discharge Summary   Patient ID: Alicia [name],  MRN: [id number], DOB/AGE: 05-29-1946 69 y.o.  Admit date: 10/21/2015 Discharge date: 10/22/2015  Primary Care Provider: Webb Silversmith Primary Cardiologist: Dr. Harrington Challenger  Discharge Diagnoses    Principal Problem:   Unstable angina [facility]) - s/p DES to RCA Active Problems:   Exertional angina ([place])   CAD S/P percutaneous coronary angioplasty   Hyperlipidemia   Essential hypertension   Microcytic anemia   Morbid obesity ([place])   Hypertensive heart disease   Allergies Allergies  Allergen Reactions  . Iodinated Diagnostic Agents   . Sulfa Antibiotics     Diagnostic Studies/Procedures    - Cardiac catheterization this admission, please see full report and above for summary. - 2D echoes as below _____________   History of Present Illness  Alicia [name] is a 69 y/o F with history of HTN, HLD, DM, GERD, morbid obesity, DM who was seen in the office recently for symptoms of chest pain and SOB concerning for [place].  Hospital Course   She was admitted for cath 10/21/15 and found to have CAD - received DES to RCA. Otherwise she had moderate diffuse stenosis of mod diagonal, diffuse irregularity LCx and LAD, to be treated medically. She was started on ASA/Plavix. (Note: If further cath/PCI required, would NOT use the right radial because of severe tortuosity.) 2D echo 10/21/15: mod LVH, EF 60-65%, no RWMA, calcified MV, mod-severe LAE. (Note it appears duplicate echo was done 10/20/15 showing moderate LVH, severe focal basal septal hypertrophy, grade 1 DD, severe LAE, mod TR, mild pulm HTN). The patient tolerated PCI well. She was noted to have elevated BP post-cath. She received metoprolol 25mg  x1 without much change in BP. She was started on carvedilol 6.25mg  BID this morning in addition to home spironolactone, losartan, and cardura. We anticipate she may need further titration as outpatient. She was asked to monitor BP at home and call if  running>130/80 before next appt. She was also noted to have a chronic appearing microcytic anemia with mild downtrend compared to one year ago. She does report spotty rectal bleeding 1-2x a week on the toilet paper but no aggressive frank bleeding. We asked her to please contact PCP on Monday 12/5 to discuss this further when their office opens - she has never mentioned this to them. She was also asked to observe for any worsening bleeding and contact her doctor immediately if she notices this. Would consider repeat CBC at f/u appt in 1 week. Also, her pravastatin was changed to Lipitor - if the patient is tolerating statin at time of follow-up appointment, would consider rechecking liver function/lipid panel in 6-8 weeks. I have sent a message to our [facility] office's scheduler requesting a follow-up appointment, and our office will call the patient with this information. Dr. Marlou Porch has seen and examined the patient today and feels she is stable for discharge.   Consultants: N/A _____________  Discharge Vitals Blood pressure 172/72, pulse 73, temperature 98.5 F (36.9 C), temperature source Oral, resp. rate 18, height 5\' 5"  (1.651 m), weight 301 lb 1.6 oz (136.578 kg), SpO2 96 %.  Filed Weights   10/21/15 0554 10/22/15 0403  Weight: 299 lb (135.626 kg) 301 lb 1.6 oz (136.578 kg)   _____________  Labs     CBC  Recent Labs  10/22/15 0501  WBC 7.8  HGB 8.8*  HCT 29.4*  MCV 69.0*  PLT [id number]   Basic Metabolic Panel  Recent Labs  10/22/15 0501  NA 138  K 3.7  CL 108  CO2 22  GLUCOSE 128*  BUN 24*  CREATININE 1.09*  CALCIUM 8.6*   ____________  Disposition   Pt is being discharged home today in good condition. _____________  Follow-up Plans & Appointments    Follow-up Information    Follow up with [name], MD.   Specialty:  Cardiology   Why:  Office will call you for your followup appointment with Dr. Harrington Challenger or one of our PAs/NPs. Call office if you have not heard  back in 3 days.   Contact information:   [place] [address] [phone]      Discharge Instructions    AMB Referral to Cardiac Rehabilitation - Phase II    Complete by:  As directed   Diagnosis:  PCI     Diet - low sodium heart healthy    Complete by:  As directed      Increase activity slowly    Complete by:  As directed   If you notice any increased rectal bleeding or black stools, please call your doctor immediately. Please call your primary doctor early on Monday to discuss further workup of your anemia.  No driving for 2 days. No lifting over 5 lbs for 1 week. No sexual activity for 1 week. Keep procedure site clean & dry. If you notice increased pain, swelling, bleeding or pus, call/return!  You may shower, but no soaking baths/hot tubs/pools for 1 week.   Please monitor your blood pressure occasionally at home over the next few days. This can be done by a home blood pressure cuff or at places like [facility], [facility], etc. Call our office if you tend to get readings of greater than 130 on the top number or 80 on the bottom number.  Your pravastatin was stopped and changed to atorvastatin. This is to help prevent further progression of your heart disease. Other new medicines include carvedilol, clopidogrel, and nitroglycerin.           Discharge Medications   Current Discharge Medication List    START taking these medications   Details  atorvastatin (LIPITOR) 80 MG tablet Take 1 tablet (80 mg total) by mouth every evening. Qty: 30 tablet, Refills: 6    carvedilol (COREG) 6.25 MG tablet Take 1 tablet (6.25 mg total) by mouth 2 (two) times daily with a meal. Qty: 60 tablet, Refills: 6    clopidogrel (PLAVIX) 75 MG tablet Take 1 tablet (75 mg total) by mouth daily. Qty: 30 tablet, Refills: 11    nitroGLYCERIN (NITROSTAT) 0.4 MG SL tablet Place 1 tablet (0.4 mg total) under the tongue every 5 (five) minutes as needed for chest pain (up to 3  doses). Qty: 25 tablet, Refills: 3      CONTINUE these medications which have NOT CHANGED   Details  aspirin 81 MG tablet Take 81 mg by mouth daily.    doxazosin (CARDURA) 4 MG tablet Take 1 tablet (4 mg total) by mouth at bedtime.    Associated Diagnoses: Essential hypertension    glipiZIDE (GLUCOTROL) 5 MG tablet Take 1 tablet (5 mg total) by mouth 2 (two) times daily before a meal.    Associated Diagnoses: Type 2 diabetes mellitus without complication, without long-term current use of insulin (HCC)    Insulin Pen Needle 31G X 5 MM MISC 1 each by Does not apply route daily as needed.    Associated Diagnoses: Type 2 diabetes mellitus without complication,  without long-term current use of insulin (HCC)    Liraglutide (VICTOZA) 18 MG/3ML SOPN Inject 0.2 mLs (1.2 mg total) into the skin daily.    Associated Diagnoses: Type 2 diabetes mellitus without complication, without long-term current use of insulin (HCC)    losartan (COZAAR) 100 MG tablet TAKE 1 TABLET (100 MG TOTAL) BY MOUTH DAILY.    Associated Diagnoses: Essential hypertension    Multiple Vitamins-Minerals (MULTIVITAMIN WITH MINERALS) tablet Take 1 tablet by mouth daily.    pantoprazole (PROTONIX) 40 MG tablet Take 1 tablet (40 mg total) by mouth daily.    Associated Diagnoses: GERD (gastroesophageal reflux disease)    sitaGLIPtin (JANUVIA) 100 MG tablet Take 1 tablet (100 mg total) by mouth daily.    Associated Diagnoses: Type 2 diabetes mellitus without complication, without long-term current use of insulin (HCC)    spironolactone (ALDACTONE) 25 MG tablet Take 1 tablet (25 mg total) by mouth daily.    Associated Diagnoses: Essential hypertension    traMADol (ULTRAM) 50 MG tablet Take 1-2 tablets (50-100 mg total) by mouth every 6 (six) hours as needed for pain.       STOP taking these medications     pravastatin (PRAVACHOL) 40 MG tablet         _____________   Outstanding Labs/Studies   See above re:  suggestions.  Duration of Discharge Encounter   Greater than 30 minutes including physician time.  Signed, Dayna Dunn PA-C 10/22/2015, 8:50 AM   Personally seen and examined. Agree with above. BP has been challenging, asymptomatic Start coreg 6.25 BID with anticipated increase as outpatient Continue spironolactone, losartan, cardura. Consider amlodipine if not used in the past  Anemia - seems out of proportion to spotty rectal bleeding. Needs complete workup by PCP. DAPT may aggravate.   Lungs CTA, ambulated well.   OK for DC  Candee Furbish, MD

## 2015-10-22 NOTE — Discharge Instructions (Addendum)
MEDICATION TO STOP:  STOP TAKING PRAVACHOL (PRAVASTATIN)

## 2015-10-22 NOTE — Progress Notes (Signed)
Patient: Alicia [name] / Admit Date: 10/21/2015 / Date of Encounter: 10/22/2015, 7:51 AM   Subjective: Feeling good this AM. No CP or SOB. Does report spotty rectal bleeding 1-2x/week.    Objective: Telemetry: NSR Physical Exam: Blood pressure 172/72, pulse 73, temperature 98.5 F (36.9 C), temperature source Oral, resp. rate 18, height 5\' 5"  (1.651 m), weight 301 lb 1.6 oz (136.578 kg), SpO2 96 %. General: Well developed obese AAF in no acute distress. Head: Normocephalic, atraumatic, sclera non-icteric, no xanthomas, nares are without discharge. Neck: JVP not elevated. Lungs: Clear bilaterally to auscultation without wheezes, rales, or rhonchi. Breathing is unlabored. Heart: RRR S1 S2 without murmurs, rubs, or gallops.  Abdomen: Soft, non-tender, non-distended with normoactive bowel sounds. No rebound/guarding. Extremities: No clubbing or cyanosis. No edema. Distal pedal pulses are 2+ and equal bilaterally. Right radial cath site without hematoma or ecchymosis; good pulse. Neuro: Alert and oriented X 3. Moves all extremities spontaneously. Psych:  Responds to questions appropriately with a normal affect.   Intake/Output Summary (Last 24 hours) at 10/22/15 0751 Last data filed at 10/22/15 0408  Gross per 24 hour  Intake   1020 ml  Output   2000 ml  Net   -980 ml    Inpatient Medications:  . aspirin  81 mg Oral Daily  . carvedilol  25 mg Oral BID WC  . clopidogrel  75 mg Oral Q breakfast  . doxazosin  4 mg Oral QHS  . glipiZIDE  5 mg Oral BID AC  . insulin aspart  0-15 Units Subcutaneous TID WC  . labetalol  10 mg Intravenous Once  . linagliptin  5 mg Oral Daily  . Liraglutide  0.2 mL Subcutaneous Daily  . losartan  100 mg Oral Daily  . pantoprazole  40 mg Oral Daily  . pravastatin  40 mg Oral Daily  . spironolactone  25 mg Oral Daily   Infusions:    Labs:  Recent Labs  10/22/15 0501  NA 138  K 3.7  CL 108  CO2 22  GLUCOSE 128*  BUN 24*  CREATININE 1.09*    CALCIUM 8.6*   No results for input(s): AST, ALT, ALKPHOS, BILITOT, PROT, ALBUMIN in the last 72 hours.  Recent Labs  10/22/15 0501  WBC 7.8  HGB 8.8*  HCT 29.4*  MCV 69.0*  PLT 241   No results for input(s): CKTOTAL, CKMB, TROPONINI in the last 72 hours. Invalid input(s): POCBNP No results for input(s): HGBA1C in the last 72 hours.   Radiology/Studies:  Dg Chest 2 View  10/17/2015  CLINICAL DATA:  Wheezing.  Cardiac catheterization . EXAM: CHEST  2 VIEW COMPARISON:  10/13/2011.  05/23/1999. FINDINGS: Mediastinum and hilar structures normal the lungs are clear. Heart size is stable. No focal infiltrate. No pleural effusion or pneumothorax. IMPRESSION: No acute abnormality. Electronically Signed   By: Marcello Moores  Register   On: 10/17/2015 10:50     Assessment and Plan  14F with HTN, HLD, DM, GERD, obesity, DM admitted with sx c/w [place]. Cath 10/21/15: s/p DES to RCA: moderate diffuse stenosis of mod diagonal, diffuse irregularity LCx and LAD. 2D echo 10/21/15: mod LVH, EF 60-65%, no RWMA, calcified MV, mod-severe LAE.  1. [place]/CAD as above - continue DAPT for 12 months. Titrate statin as below. Will include BB in home regimen at d/c (see below).  2. Essential HTN - will discuss med regimen with MD. On spironolactone and losartan at baseline. Metoprolol 25mg  x1 given last night - fellow  wrote to start carvedilol 25mg  BID this AM. This seems a little high as a starting dose. Will review with MD.  3. Hyperlipidemia - change statin to Lipitor 80mg  especially in the setting of CAD/DM. Baseline LFTs normal just a few weeks ago.  4. Acute on chronic microcytic anemia - Hgb with slight downtrend over the last year. She reports spotty rectal bleeding as above. Encouraged her to call PCP on Monday to discuss further workup.  5. Morbid obesity Body mass index is 50.11 kg/(m^2). - weight loss recommended.  Signed, Melina Copa PA-C Pager: [phone]   Personally seen and examined. Agree with  above. BP has been challenging, asymptomatic Start coreg 6.25 BID with anticipated increase as outpatient Continue spironolactone, losartan, cardura. Consider amlodipine if not used in the past  Anemia - seems out of proportion to spotty rectal bleeding. Needs complete workup by PCP. DAPT may aggravate.   Lungs CTA, ambulated well.   OK for DC  Candee Furbish, MD

## 2015-10-24 ENCOUNTER — Telehealth: Payer: Self-pay | Admitting: Internal Medicine

## 2015-10-24 DIAGNOSIS — D649 Anemia, unspecified: Secondary | ICD-10-CM

## 2015-10-24 NOTE — Telephone Encounter (Signed)
Pt request cb -  Pt was given some new medications and was taken off of a couple medications when she went to her heart catherization on Friday and doesn't know what to do next  cb number is [phone]

## 2015-10-24 NOTE — Telephone Encounter (Signed)
TOC per [facility] 12/8 @ 930 w/ Nicki Reaper

## 2015-10-24 NOTE — Telephone Encounter (Signed)
Spoke to pt and she had questions about her labs, not medications---advised pt to keep upcoming lab appt and added CBC to recheck anemia

## 2015-10-24 NOTE — Addendum Note (Signed)
Addended by: Lurlean Nanny on: 10/24/2015 02:35 PM   Modules accepted: Orders

## 2015-10-24 NOTE — Telephone Encounter (Signed)
She needs to call Dr. Burt Knack then since he is the one who made the changes.

## 2015-10-24 NOTE — Telephone Encounter (Signed)
Patient contacted regarding discharge from [facility]  Patient understands to follow up with provider ? On 12/08?at 0930am) at [facility].  Patient understands discharge instructions.  Patient understands medications and regiment. Patient understands to bring all medications to this visit.

## 2015-10-26 NOTE — Progress Notes (Deleted)
Cardiology Office Note   Date:  10/27/2015   ID:  Alicia [name], DOB [birth year], MRN [id number]   Patient Care Team: Jearld Fenton, NP as PCP - General (Internal Medicine) [name], MD as Consulting Physician (Cardiology)    Chief Complaint  Patient presents with  . Hospitalization Follow-up    [place] >> s/p PCI  . Coronary Artery Disease     History of Present Illness: Alicia [name] is a 69 y.o. female with a hx of HTN, HL, GERD, DM2, obesity, anemia.  She was evaluated by Dr. [name] and Truitt Merle, NP on 10/17/15 with chest pain felt to represent unstable angina.  Cardiac cath was arranged and this was performed 12/2.  It demonstrated severe stenosis of the RCA treated with a Synergy DES.  Residual disease included mod diffuse stenosis of a moderate caliber diagonal and diffuse irregularity of the LCx and LAD.  Medical Rx recommended for residual disease.  Echo demonstrated normal LVF.  Note, R radial artery with severe tortuosity and access from this site should be avoided in the future.     Studies/Reports Reviewed Today:  Echo 10/21/15 - Left ventricle: The cavity size was normal. There was moderate concentric hypertrophy. Systolic function was normal. The estimated ejection fraction was in the range of 60% to 65%. Wall motion was normal; there were no regional wall motion abnormalities. - Mitral valve: Calcified annulus. Mildly thickened leaflets . - Left atrium: The atrium was moderately to severely dilated.  LHC 10/21/15  1. Severe stenosis of the RCA, treated successfully with a 4.0x16 mm Synergy DES 2. Moderate diffuse stenosis of a moderate-caliber diagonal branch 3. Diffuse irregularity of the LCx and LAD  Recommend: DAPT with ASA and plavix x 12 months, medical therapy for residual CAD  **If further cath/PCI required, would NOT use the right radial because of severe tortuosity**    Indications    Exertional angina (HCC) [I20.8 (ICD-10-CM)]     Technique and Indications    INDICATION: Chest pain, shortness of breath, CCS 3 symptoms  PROCEDURAL DETAILS: The right wrist was prepped, draped, and anesthetized with 1% lidocaine. Using the modified Seldinger technique, a 5/6 French Slender sheath was introduced into the right radial artery. 3 mg of verapamil was administered through the sheath, weight-based unfractionated heparin was administered intravenously. Standard Judkins catheters were used for selective coronary angiography. There was marked subclavian tortuosity making catheter manipulation very difficult. I couldn't advance a pigtail catheter into the left ventricle. Catheter exchanges were performed over an exchange length guidewire. Following the diagnostic procedure, PCI is performed (see note below for details). There were no immediate procedural complications. A TR band was used for radial hemostasis at the completion of the procedure. The patient was transferred to the post catheterization recovery area for further monitoring.  Estimated blood loss <50 mL. There were no immediate complications during the procedure.    Coronary Findings    Dominance: Right   Left Anterior Descending   . Mid LAD lesion, 25% stenosed. Diffuse.   . First Diagonal Branch   . 1st Diag lesion, 60% stenosed. Diffuse.     Left Circumflex  There is mild the vessel.     Right Coronary Artery   . Mid RCA lesion, 75% stenosed. Discrete.   [name] PCI: The pre-interventional distal flow is normal (TIMI 3). Pre-stent angioplasty was performed. An unspecified stent was placed. Post-stent angioplasty was performed. Maximum pressure: 20 atm. The post-interventional distal flow  is normal (TIMI 3). The intervention was successful. No complications occurred at this lesion. Technically challenging PCI because of severe subclavian tortuosity and difficulty with guide manipulation/cannulation of the RCA. Multiple guides are attempted - ultimately a 5 Fr JR4 is  used. Aniomax is used, Plavix 600 mg is given on the table. A 3.0 balloon is used for predilatation and while inflated, the guide catheter is deep-seated in the vessel. The vessel is then stented with a 4.0x16 mm Synergy stent, a 4.5 mm [place] balloon is used for post-dilatation to 20 atm.  . There is no residual stenosis post intervention.        Coronary Diagrams    Diagnostic Diagram           Post-Intervention Diagram               Past Medical History  Diagnosis Date  . Essential hypertension   . Hyperlipidemia   . Morbid obesity ([place])   . GERD (gastroesophageal reflux disease)   . Anemia   . History of blood transfusion     "related to ruptured tubal pregnancy"  . Cataract   . Family history of adverse reaction to anesthesia     "oldest sister, [name], related to brain formation at back of head; when they put her to sleep it's hard to wake her up"  . Type II diabetes mellitus ([place])   . Arthritis     "right knee" (10/21/2015)  . CAD (coronary artery disease)     a. Cath 10/21/15: s/p DES to RCA: moderate diffuse stenosis of mod diagonal, diffuse irregularity LCx and LAD. 2D echo 10/21/15: mod LVH, EF 60-65%, no RWMA, calcified MV, mod-severe LAE.  [name] Hypertensive heart disease     Past Surgical History  Procedure Laterality Date  . Bariatric surgery  1984  . Ectopic pregnancy surgery    . Appendectomy  1985  . Abdominal hysterectomy  1988  . Cardiac catheterization N/A 10/21/2015    Procedure: Right/Left Heart Cath and Coronary Angiography;  Surgeon: [name], MD;  Location: [facility];  Service: Cardiovascular;  Laterality: N/A;  . Tonsillectomy    . Cholecystectomy open  1984  . Reduction mammaplasty Bilateral ~ 1976     Current Outpatient Prescriptions  Medication Sig Dispense Refill  . aspirin 81 MG tablet Take 81 mg by mouth daily.    [name] atorvastatin (LIPITOR) 80 MG tablet Take 1 tablet (80 mg total) by mouth every evening. 30 tablet 6  .  carvedilol (COREG) 6.25 MG tablet Take 1 tablet (6.25 mg total) by mouth 2 (two) times daily with a meal. 60 tablet 6  . clopidogrel (PLAVIX) 75 MG tablet Take 1 tablet (75 mg total) by mouth daily. 30 tablet 11  . doxazosin (CARDURA) 4 MG tablet Take 1 tablet (4 mg total) by mouth at bedtime. 90 tablet 1  . glipiZIDE (GLUCOTROL) 5 MG tablet Take 1 tablet (5 mg total) by mouth 2 (two) times daily before a meal. 180 tablet 0  . Insulin Pen Needle 31G X 5 MM MISC 1 each by Does not apply route daily as needed. 100 each 6  . Liraglutide (VICTOZA) 18 MG/3ML SOPN Inject 0.2 mLs (1.2 mg total) into the skin daily. 6 pen 1  . losartan (COZAAR) 100 MG tablet TAKE 1 TABLET (100 MG TOTAL) BY MOUTH DAILY. 30 tablet 5  . Multiple Vitamins-Minerals (MULTIVITAMIN WITH MINERALS) tablet Take 1 tablet by mouth daily.    . nitroGLYCERIN (NITROSTAT) 0.4  MG SL tablet Place 1 tablet (0.4 mg total) under the tongue every 5 (five) minutes as needed for chest pain (up to 3 doses). 25 tablet 3  . pantoprazole (PROTONIX) 40 MG tablet Take 1 tablet (40 mg total) by mouth daily. 30 tablet 3  . sitaGLIPtin (JANUVIA) 100 MG tablet Take 1 tablet (100 mg total) by mouth daily. 90 tablet 1  . spironolactone (ALDACTONE) 25 MG tablet Take 1 tablet (25 mg total) by mouth daily. 90 tablet 1  . traMADol (ULTRAM) 50 MG tablet Take 1-2 tablets (50-100 mg total) by mouth every 6 (six) hours as needed for pain. 45 tablet 0   No current facility-administered medications for this visit.    Allergies:   Iodinated diagnostic agents and Sulfa antibiotics    Social History:   Social History   Social History  . Marital Status: Divorced    Spouse Name: N/A  . Number of Children: 3  . Years of Education: doctorate   Occupational History  . Teacher    Social History Main Topics  . Smoking status: Never Smoker   . Smokeless tobacco: Never Used  . Alcohol Use: No  . Drug Use: No  . Sexual Activity: Not Currently   Other Topics  Concern  . Not on file   Social History Narrative   Caffeine use-yes   Regular exercise-no     Family History:   Family History  Problem Relation Age of Onset  . Stomach cancer Sister   . Diabetes Sister   . Diabetes Mother   . Heart attack Mother   . Hypertension Mother   . Hyperlipidemia Mother   . Heart disease Mother   . Diabetes Sister   . Heart attack Father   . Stroke Father   . Hypertension Father   . Hyperlipidemia Father   . Diabetes Sister       ROS:   Please see the history of present illness.   ROS    PHYSICAL EXAM: VS:  There were no vitals taken for this visit.    Wt Readings from Last 3 Encounters:  10/22/15 301 lb 1.6 oz (136.578 kg)  10/17/15 299 lb 3.2 oz (135.716 kg)  10/10/15 297 lb (134.718 kg)     GEN: Well nourished, well developed, in no acute distress HEENT: normal Neck: no JVD, no carotid bruits, no masses Cardiac:  Normal S1/S2, RRR; no murmur ,  no rubs or gallops, no edema *** Respiratory:  clear to auscultation bilaterally, no wheezing, rhonchi or rales. GI: soft, nontender, nondistended, + BS MS: no deformity or atrophy Skin: warm and dry  Neuro:  CNs II-XII intact, Strength and sensation are intact Psych: Normal affect   EKG:  EKG is ordered today.  It demonstrates:   ***   Recent Labs: 10/10/2015: ALT 11 10/22/2015: BUN 24*; Creatinine, Ser 1.09*; Hemoglobin 8.8*; Platelets 241; Potassium 3.7; Sodium 138    Lipid Panel    Component Value Date/Time   CHOL 208* 10/10/2015 1538   TRIG 47.0 10/10/2015 1538   HDL 69.10 10/10/2015 1538   CHOLHDL 3 10/10/2015 1538   VLDL 9.4 10/10/2015 1538   LDLCALC 130* 10/10/2015 1538      ASSESSMENT AND PLAN:  ***    Medication Changes: Current medicines are reviewed at length with the patient today.  Concerns regarding medicines are as outlined above.  The following changes have been made:   Discontinued Medications   No medications on file   Modified Medications  No  medications on file   New Prescriptions   No medications on file   Labs/ tests ordered today include:   No orders of the defined types were placed in this encounter.     Disposition:    FU with ***    Signed, Richardson Dopp, PA-C, MHS 10/27/2015 8:07 AM    [facility] [place], [place], [place]  [address] Phone: [phone]; Fax: [phone]

## 2015-10-27 ENCOUNTER — Encounter: Payer: Self-pay | Admitting: Physician Assistant

## 2015-10-27 ENCOUNTER — Ambulatory Visit (INDEPENDENT_AMBULATORY_CARE_PROVIDER_SITE_OTHER): Payer: Medicare Other | Admitting: Physician Assistant

## 2015-10-27 VITALS — BP 140/90 | HR 75 | Ht 65.0 in | Wt 296.6 lb

## 2015-10-27 DIAGNOSIS — Z9861 Coronary angioplasty status: Secondary | ICD-10-CM | POA: Diagnosis not present

## 2015-10-27 DIAGNOSIS — I2510 Atherosclerotic heart disease of native coronary artery without angina pectoris: Secondary | ICD-10-CM

## 2015-10-27 DIAGNOSIS — I10 Essential (primary) hypertension: Secondary | ICD-10-CM

## 2015-10-27 MED ORDER — CARVEDILOL 12.5 MG PO TABS: 12.5000 mg | ORAL_TABLET | Freq: Two times a day (BID) | ORAL | Status: DC

## 2015-10-27 NOTE — Progress Notes (Signed)
CARDIOLOGY OFFICE NOTE  Date:  10/27/2015    Alicia [name] Date of Birth: Oct 16, 1946 Medical Record #[id number]  PCP:  Alicia Silversmith, NP  Cardiologist:  Dr. Dorris [name]  Chief Complaint  Patient presents with  . Hospitalization Follow-up    [place] >> s/p PCI  . Coronary Artery Disease    s/p DES to RCA 10/21/2015, 60% diagonal residual  . Hypertension    History of Present Illness:  Alicia [name] is a 69 y.o. female with a hx of HTN, HL, GERD, DM2, obesity, anemia. She was evaluated by Dr. Dorris [name] and Alicia Merle, NP on 10/17/15 with chest pain felt to represent unstable angina. Cardiac cath was arranged and this was performed 12/2. It demonstrated severe stenosis of the RCA treated with a Synergy DES. Residual disease included mod diffuse stenosis of a moderate caliber diagonal and diffuse irregularity of the LCx and LAD. Medical Rx recommended for residual disease. Echo demonstrated normal LVF. Note, R radial artery with severe tortuosity and access from this site should be avoided in the future.She was also noted to have downtrending acute on chronic anemia. She was advised to followup with her PCP regarding anemia. During the hospitalization, she had uncontrolled HTN, metoprolol $RemoveBeforeD'25mg'RldOBWShIwpcvd$  BID was added, and later changed to coreg 6.$RemoveBe'25mg'GlRqLpatm$  BID. Hgb A1C 6.0. She was discharged on 10/22/2015.  She presents back today for TOC followup, she has been doing well since discharge. She has been compliant with her medications. She does have chronic 1+ pitting edema, however denies any PND and orthopnea. Despite taking ASA and plavix, she has not had significant bleeding issue since discharge. She has discussed with her PCP who told her on her next followup, PCP will check hgb. There does not appear to be any plan for GI workup underway at this time. She has not started on her cardiac rehab yet. Her SBP has been occasionally high at home. She has not experienced her anginal symptom since  cardiac cath.     Studies/Reports Reviewed Today:  Echo 10/21/15 - Left ventricle: The cavity size was normal. There was moderate concentric hypertrophy. Systolic function was normal. The estimated ejection fraction was in the range of 60% to 65%. Wall motion was normal; there were no regional wall motion abnormalities. - Mitral valve: Calcified annulus. Mildly thickened leaflets . - Left atrium: The atrium was moderately to severely dilated.  LHC 10/21/15  1. Severe stenosis of the RCA, treated successfully with a 4.0x16 mm Synergy DES 2. Moderate diffuse stenosis of a moderate-caliber diagonal branch 3. Diffuse irregularity of the LCx and LAD  Recommend: DAPT with ASA and plavix x 12 months, medical therapy for residual CAD  **If further cath/PCI required, would NOT use the right radial because of severe tortuosity**         Indications    Exertional angina (HCC) [I20.8 (ICD-10-CM)]    Technique and Indications    INDICATION: Chest pain, shortness of breath, CCS 3 symptoms  PROCEDURAL DETAILS: The right wrist was prepped, draped, and anesthetized with 1% lidocaine. Using the modified Seldinger technique, a 5/6 French Slender sheath was introduced into the right radial artery. 3 mg of verapamil was administered through the sheath, weight-based unfractionated heparin was administered intravenously. Standard Judkins catheters were used for selective coronary angiography. There was marked subclavian tortuosity making catheter manipulation very difficult. I couldn't advance a pigtail catheter into the left ventricle. Catheter exchanges were performed over an exchange length guidewire. Following the  diagnostic procedure, PCI is performed (see note below for details). There were no immediate procedural complications. A TR band was used for radial hemostasis at the completion of the procedure. The patient was transferred to the post catheterization recovery area for  further monitoring.  Estimated blood loss <50 mL. There were no immediate complications during the procedure.    Coronary Findings    Dominance: Right   Left Anterior Descending   . Mid LAD lesion, 25% stenosed. Diffuse.   . First Diagonal Branch   . 1st Diag lesion, 60% stenosed. Diffuse.     Left Circumflex  There is mild the vessel.     Right Coronary Artery   . Mid RCA lesion, 75% stenosed. Discrete.   [name] PCI: The pre-interventional distal flow is normal (TIMI 3). Pre-stent angioplasty was performed. An unspecified stent was placed. Post-stent angioplasty was performed. Maximum pressure: 20 atm. The post-interventional distal flow is normal (TIMI 3). The intervention was successful. No complications occurred at this lesion. Technically challenging PCI because of severe subclavian tortuosity and difficulty with guide manipulation/cannulation of the RCA. Multiple guides are attempted - ultimately a 5 Fr JR4 is used. Aniomax is used, Plavix 600 mg is given on the table. A 3.0 balloon is used for predilatation and while inflated, the guide catheter is deep-seated in the vessel. The vessel is then stented with a 4.0x16 mm Synergy stent, a 4.5 mm [place] balloon is used for post-dilatation to 20 atm.  . There is no residual stenosis post intervention.        Coronary Diagrams    Diagnostic Diagram           Post-Intervention Diagram                    Past Medical History  Diagnosis Date  . Essential hypertension   . Hyperlipidemia   . Morbid obesity ([place])   . GERD (gastroesophageal reflux disease)   . Anemia   . History of blood transfusion     "related to ruptured tubal pregnancy"  . Cataract   . Family history of adverse reaction to anesthesia     "oldest sister, Alicia [name], related to brain formation at back of head; when they put her to sleep it's hard to wake her up"  . Type II diabetes mellitus ([place])   .  Arthritis     "right knee" (10/21/2015)  . CAD (coronary artery disease)     a. Cath 10/21/15: s/p DES to RCA: moderate diffuse stenosis of mod diagonal, diffuse irregularity LCx and LAD. 2D echo 10/21/15: mod LVH, EF 60-65%, no RWMA, calcified MV, mod-severe LAE.  [name] Hypertensive heart disease     Past Surgical History  Procedure Laterality Date  . Bariatric surgery  1984  . Ectopic pregnancy surgery    . Appendectomy  1985  . Abdominal hysterectomy  1988  . Cardiac catheterization N/A 10/21/2015    Procedure: Right/Left Heart Cath and Coronary Angiography;  Surgeon: [name], MD;  Location: [facility];  Service: Cardiovascular;  Laterality: N/A;  . Tonsillectomy    . Cholecystectomy open  1984  . Reduction mammaplasty Bilateral ~ 1976     Medications: Current Outpatient Prescriptions  Medication Sig Dispense Refill  . aspirin 81 MG tablet Take 81 mg by mouth daily.    [name] atorvastatin (LIPITOR) 80 MG tablet Take 1 tablet (80 mg total) by mouth every evening. 30 tablet 6  . clopidogrel (PLAVIX) 75 MG tablet Take 1  tablet (75 mg total) by mouth daily. 30 tablet 11  . DiphenhydrAMINE HCl, Sleep, (UNISOM SLEEPGELS PO) Take 1 tablet by mouth at bedtime as needed (SLEEP).    [name] doxazosin (CARDURA) 4 MG tablet Take 1 tablet (4 mg total) by mouth at bedtime. 90 tablet 1  . glipiZIDE (GLUCOTROL) 5 MG tablet Take 1 tablet (5 mg total) by mouth 2 (two) times daily before a meal. 180 tablet 0  . Insulin Pen Needle 31G X 5 MM MISC 1 each by Does not apply route daily as needed. 100 each 6  . Liraglutide (VICTOZA) 18 MG/3ML SOPN Inject 0.2 mLs (1.2 mg total) into the skin daily. 6 pen 1  . losartan (COZAAR) 100 MG tablet TAKE 1 TABLET (100 MG TOTAL) BY MOUTH DAILY. 30 tablet 5  . Multiple Vitamins-Minerals (MULTIVITAMIN WITH MINERALS) tablet Take 1 tablet by mouth daily.    . nitroGLYCERIN (NITROSTAT) 0.4 MG SL tablet Place 1 tablet (0.4 mg total) under the tongue every 5 (five) minutes  as needed for chest pain (up to 3 doses). 25 tablet 3  . sitaGLIPtin (JANUVIA) 100 MG tablet Take 1 tablet (100 mg total) by mouth daily. 90 tablet 1  . spironolactone (ALDACTONE) 25 MG tablet Take 1 tablet (25 mg total) by mouth daily. 90 tablet 1  . traMADol (ULTRAM) 50 MG tablet Take 1-2 tablets (50-100 mg total) by mouth every 6 (six) hours as needed for pain. 45 tablet 0  . carvedilol (COREG) 12.5 MG tablet Take 1 tablet (12.5 mg total) by mouth 2 (two) times daily. 180 tablet 3   No current facility-administered medications for this visit.    Allergies: Allergies  Allergen Reactions  . Iodinated Diagnostic Agents   . Sulfa Antibiotics     Social History: The patient  reports that she has never smoked. She has never used smokeless tobacco. She reports that she does not drink alcohol or use illicit drugs.   Family History: The patient's family history includes Diabetes in her mother, sister, sister, and sister; Heart attack in her father and mother; Heart disease in her mother; Hyperlipidemia in her father and mother; Hypertension in her father and mother; Stomach cancer in her sister; Stroke in her father.   Review of Systems: Please see the history of present illness.   Otherwise, the review of systems is positive for chronic RLQ abdominal discomfort unchanged, chronic 1+ pitting edema, otherwise feeling well.   All other systems are reviewed and negative.   Physical Exam: VS:  BP 140/90 mmHg  Pulse 75  Ht $R'5\' 5"'vB$  (1.651 m)  Wt 296 lb 9.6 oz (134.537 kg)  BMI 49.36 kg/m2 .  BMI Body mass index is 49.36 kg/(m^2).  Wt Readings from Last 3 Encounters:  10/27/15 296 lb 9.6 oz (134.537 kg)  10/22/15 301 lb 1.6 oz (136.578 kg)  10/17/15 299 lb 3.2 oz (135.716 kg)    General: Pleasant. Well developed, well nourished and in no acute distress.  HEENT: Normal. Neck: Supple, no JVD, carotid bruits, or masses noted.  Cardiac: Regular rate and rhythm. No murmurs, rubs, or gallops. 1+  pitting edema bilaterally. R radial cath site stable with 2+ pulses Respiratory:  Lungs are clear to auscultation bilaterally with normal work of breathing.  GI: Soft and nontender.  MS: No deformity or atrophy. Gait and ROM intact. Skin: Warm and dry. Color is normal.  Neuro:  Strength and sensation are intact and no gross focal deficits noted.  Psych: Alert, appropriate and  with normal affect.   LABORATORY DATA:  EKG:  EKG is ordered today. This demonstrates NSR with TWI in inferior lateral leads. TWI in lateral leads more prominent when compare to previous EKG  Lab Results  Component Value Date   WBC 7.8 10/22/2015   HGB 8.8* 10/22/2015   HCT 29.4* 10/22/2015   PLT 241 10/22/2015   GLUCOSE 128* 10/22/2015   CHOL 208* 10/10/2015   TRIG 47.0 10/10/2015   HDL 69.10 10/10/2015   LDLCALC 130* 10/10/2015   ALT 11 10/10/2015   AST 18 10/10/2015   NA 138 10/22/2015   K 3.7 10/22/2015   CL 108 10/22/2015   CREATININE 1.09* 10/22/2015   BUN 24* 10/22/2015   CO2 22 10/22/2015   TSH 0.71 01/29/2014   INR 1.03 10/17/2015   HGBA1C 6.0 10/10/2015   MICROALBUR 0.5 01/29/2014     Assessment/Plan:  1. CAD newly diagnosed in 10/2015 DES to RCA with preserved EF  - Cath 10/21/15: s/p DES to RCA: moderate diffuse stenosis of mod diagonal, diffuse irregularity LCx and LAD  - 2D echo 10/21/15: mod LVH, EF 60-65%, no RWMA, calcified MV, mod-severe LAE.  - She has been compliant with ASA, plavix, statin, coreg and losartan. Her EKG was reviewed, it does have some TWI in V5-V6 that is worse when compare to discharge EKG, but when compare to EKG on 11/28, it has not changed much. Given the fact that she just had a stent, has been compliant with DAPT and no further symptom, we will monitor this for now.   - she does have 1+ bilateral pitting edema, denies any orthopnea and PND, advised her to continue spironolactone, cut back on salt and limit daily fluid to <2 liter per day. Also advised her to  lift her leg during sleep with extra pillow. She is aware she need to contact cardiology if has increased SOB or edema  2. Acute on chronic anemia: slow but steady down trend over the past year, 10.5 --> 9.7 --> 9.3 --> 8.8. She has not noticed increased bleeding since starting ASA and plavix. She has history of tubular pregnancy and chronic pain in RLQ for years that has not changed. She also noticed occasional blood in the stool, she contacted her PCP's office, she is going to see her PCP on 12/28. Since she has not complained of increased bleeding since discharge and her PCP is planning to check her Hgb, I will hold off on ordering another CBC today. I have went over sign and symptom of severe anemia with her, she understand she will need to seek medical attention if has symptom of dizziness, weakness and feeling of passing out.   3. Hypertension: continue coreg, losratan, spironolactone. SBP 140 today, her SBP in the hospital was >200, will increase coreg to 12.$RemoveBef'5mg'QfgkFntbFl$  BID. She has been instructed to continue monitor her BP and contact cardiology if SBP is still high  4. Hyperlipidemia: transitioned from pravachol to lipitor $RemoveBe'80mg'SOEaHoeFg$  during recent admission, recheck LFT in 2 month  5. DM II: on glipizide, januvia, and victoza. Hgb A1C 6.0 on 10/10/2015. Managed by PCP.  Current medicines are reviewed with the patient today.  The patient does not have concerns regarding medicines other than what has been noted above.  The following changes have been made: Coreg increased to 12.$RemoveBeforeD'5mg'PqjfYESVloWhoF$  BID. See above.  Labs/ tests ordered today include:    Orders Placed This Encounter  Procedures  . Hepatic function panel  . EKG 12-Lead  Disposition:   FU with Dr. Harrington Challenger in 2 month.   Patient is agreeable to this plan and will call if any problems develop in the interim.   Signed: Almyra Deforest PA-C 10/27/2015 10:37 AM  [facility] [address] [place], [place]   [address] Phone: [phone] Fax: [phone]

## 2015-10-27 NOTE — Patient Instructions (Addendum)
Medication Instructions:  Your physician has recommended you make the following change in your medication:  1.  INCREASE the Carvedilol (Coreg) to 12.5 mg taking 1 tablet twice a day   Labwork: In 2 Mionths (at same time appt with Dr. Harrington Challenger)  LFT   Testing/Procedures: None ordered  Follow-Up: Your physician recommends that you schedule a follow-up appointment in:  2 [place].   Any Other Special Instructions Will Be Listed Below (If Applicable).     If you need a refill on your cardiac medications before your next appointment, please call your pharmacy.

## 2015-11-01 ENCOUNTER — Other Ambulatory Visit: Payer: Self-pay | Admitting: Internal Medicine

## 2015-11-03 ENCOUNTER — Telehealth: Payer: Self-pay | Admitting: Internal Medicine

## 2015-11-03 DIAGNOSIS — R0602 Shortness of breath: Secondary | ICD-10-CM

## 2015-11-03 MED ORDER — FUROSEMIDE 20 MG PO TABS: 20.0000 mg | ORAL_TABLET | Freq: Every day | ORAL | Status: DC

## 2015-11-03 NOTE — Telephone Encounter (Signed)
Patient c/o having SOB, BLE swelling, and feeling extremely tired for two days. Patient has not been SOB since her heart cath on 10/21/2015. Patient states she can't function and she feels like she could sleep all day.Patient stated her BP from her wrist machine showed 204/132, but she does not think this is accurate . Will consult DOD or FLEX.  Almyra Deforest PA (FLEX) ordered patient to start Lasix 20 mg by mouth daily, and take 40 mg the first two days. CBC ordered for tomorrow and BMET in a week. Patient needs a follow-up appointment in two weeks. Patient informed if she has any of these symptoms to seek medical attention right away, 1- SOB at rest. 2- Waking up at night feeling smothered. Patient verbalized understanding. I will call her back with an appointment.  Spoke with Dr. Harrington Challenger, patient can be seen tomorrow. Patient verbalized understanding.

## 2015-11-03 NOTE — Telephone Encounter (Signed)
New message      Pt c/o Shortness Of Breath: STAT if SOB developed within the last 24 hours or pt is noticeably SOB on the phone  1. Are you currently SOB (can you hear that pt is SOB on the phone)? no 2. How long have you been experiencing SOB? Really got bad yesterday  3. Are you SOB when sitting or when up moving around? Sitting down and moving around  4. Are you currently experiencing any other symptoms? no Pt had a cath on 10-21-15

## 2015-11-04 ENCOUNTER — Encounter: Payer: Self-pay | Admitting: Internal Medicine

## 2015-11-04 ENCOUNTER — Other Ambulatory Visit (INDEPENDENT_AMBULATORY_CARE_PROVIDER_SITE_OTHER): Payer: Medicare Other | Admitting: *Deleted

## 2015-11-04 ENCOUNTER — Telehealth: Payer: Self-pay | Admitting: Internal Medicine

## 2015-11-04 ENCOUNTER — Ambulatory Visit (INDEPENDENT_AMBULATORY_CARE_PROVIDER_SITE_OTHER): Payer: Medicare Other | Admitting: Internal Medicine

## 2015-11-04 VITALS — BP 134/68 | HR 77 | Ht 65.0 in | Wt 293.8 lb

## 2015-11-04 DIAGNOSIS — D509 Iron deficiency anemia, unspecified: Secondary | ICD-10-CM

## 2015-11-04 DIAGNOSIS — R5383 Other fatigue: Secondary | ICD-10-CM

## 2015-11-04 DIAGNOSIS — R0602 Shortness of breath: Secondary | ICD-10-CM

## 2015-11-04 DIAGNOSIS — Z9861 Coronary angioplasty status: Secondary | ICD-10-CM

## 2015-11-04 DIAGNOSIS — I2510 Atherosclerotic heart disease of native coronary artery without angina pectoris: Secondary | ICD-10-CM

## 2015-11-04 LAB — IRON AND TIBC
%SAT: 13 % (ref 11–50)
IRON: 45 ug/dL (ref 45–160)
TIBC: 357 ug/dL (ref 250–450)
UIBC: 312 ug/dL (ref 125–400)

## 2015-11-04 LAB — BRAIN NATRIURETIC PEPTIDE: Brain Natriuretic Peptide: 44.5 pg/mL (ref 0.0–100.0)

## 2015-11-04 LAB — FERRITIN: Ferritin: 14 ng/mL (ref 10–291)

## 2015-11-04 LAB — BASIC METABOLIC PANEL
BUN: 20 mg/dL (ref 7–25)
CALCIUM: 9.1 mg/dL (ref 8.6–10.4)
CO2: 28 mmol/L (ref 20–31)
CREATININE: 1.28 mg/dL — AB (ref 0.50–0.99)
Chloride: 101 mmol/L (ref 98–110)
Glucose, Bld: 83 mg/dL (ref 65–99)
Potassium: 4.3 mmol/L (ref 3.5–5.3)
SODIUM: 142 mmol/L (ref 135–146)

## 2015-11-04 LAB — CBC
HCT: 32 % — ABNORMAL LOW (ref 36.0–46.0)
Hemoglobin: 9.7 g/dL — ABNORMAL LOW (ref 12.0–15.0)
MCH: 21.3 pg — AB (ref 26.0–34.0)
MCHC: 30.3 g/dL (ref 30.0–36.0)
MCV: 70.3 fL — AB (ref 78.0–100.0)
PLATELETS: 256 10*3/uL (ref 150–400)
RBC: 4.55 MIL/uL (ref 3.87–5.11)
RDW: 19 % — AB (ref 11.5–15.5)
WBC: 5.1 10*3/uL (ref 4.0–10.5)

## 2015-11-04 LAB — VITAMIN B12: VITAMIN B 12: 766 pg/mL (ref 211–911)

## 2015-11-04 LAB — FOLATE

## 2015-11-04 MED ORDER — FERROUS SULFATE 325 (65 FE) MG PO TBEC: 325.0000 mg | DELAYED_RELEASE_TABLET | Freq: Three times a day (TID) | ORAL | Status: DC

## 2015-11-04 MED ORDER — FUROSEMIDE 20 MG PO TABS: ORAL_TABLET | ORAL | Status: DC

## 2015-11-04 NOTE — Telephone Encounter (Signed)
New message      Pt was seen today.  She is calling to get lab results and to see if she needs to continue taking lasix

## 2015-11-04 NOTE — Telephone Encounter (Signed)
Dr. Harrington Challenger reviewed labs:  Notes Recorded by [name], MD on 11/04/2015 at 4:55 PM Take 40 lasix 2x per week Then 20 mg on other days She is anemic Better than 2 wks ago  Make sure that she is taking 324 iron sulfate 2x per day F/U CBC and BMET in 2 wks    Informed patient.  She has not taken iron sulfate, will enter order for this as well as repeat labs.  Pt has appt with PCP at [facility] in on 12/28 and will have drawn at that time.

## 2015-11-04 NOTE — Progress Notes (Signed)
Cardiology Office Note   Date:  11/04/2015   ID:  Alicia [name], DOB 1946-01-08, MRN [id number]  PCP:  Alicia Silversmith, NP  Cardiologist:   [name], MD   No chief complaint on file.     History of Present Illness: Alicia [name] is a 69 y.o. female with a history of CAD   I saw the pt with L Gerhardt in November  She went on to have L heart cath with resultant interventiosn to RCA (DES with residural  Feeling pretty good the first days after procedure  Now, more SOB  Lasiix started yesteday after she called in  [place] she urinated all night  Still SOB Edema a little better. Denies CP     Current Outpatient Prescriptions  Medication Sig Dispense Refill  . aspirin 81 MG tablet Take 81 mg by mouth daily.    [name] atorvastatin (LIPITOR) 80 MG tablet Take 1 tablet (80 mg total) by mouth every evening. 30 tablet 6  . carvedilol (COREG) 12.5 MG tablet Take 1 tablet (12.5 mg total) by mouth 2 (two) times daily. 180 tablet 3  . clopidogrel (PLAVIX) 75 MG tablet Take 1 tablet (75 mg total) by mouth daily. 30 tablet 11  . DiphenhydrAMINE HCl, Sleep, (UNISOM SLEEPGELS PO) Take 1 tablet by mouth at bedtime as needed (SLEEP).    [name] doxazosin (CARDURA) 4 MG tablet Take 1 tablet (4 mg total) by mouth at bedtime. 90 tablet 1  . furosemide (LASIX) 20 MG tablet Take 1 tablet (20 mg total) by mouth daily. Take 40 mg by mouth 1st two days. 30 tablet 3  . glipiZIDE (GLUCOTROL) 5 MG tablet Take 1 tablet (5 mg total) by mouth 2 (two) times daily before a meal. 180 tablet 0  . Insulin Pen Needle 31G X 5 MM MISC 1 each by Does not apply route daily as needed. 100 each 6  . Liraglutide (VICTOZA) 18 MG/3ML SOPN Inject 0.2 mLs (1.2 mg total) into the skin daily. 6 pen 1  . losartan (COZAAR) 100 MG tablet TAKE 1 TABLET (100 MG TOTAL) BY MOUTH DAILY. 30 tablet 5  . Multiple Vitamins-Minerals (MULTIVITAMIN WITH MINERALS) tablet Take 1 tablet by mouth daily.    . nitroGLYCERIN (NITROSTAT) 0.4 MG SL tablet Place 1  tablet (0.4 mg total) under the tongue every 5 (five) minutes as needed for chest pain (up to 3 doses). 25 tablet 3  . sitaGLIPtin (JANUVIA) 100 MG tablet Take 1 tablet (100 mg total) by mouth daily. 90 tablet 1  . spironolactone (ALDACTONE) 25 MG tablet Take 1 tablet (25 mg total) by mouth daily. 90 tablet 1  . traMADol (ULTRAM) 50 MG tablet Take 1-2 tablets (50-100 mg total) by mouth every 6 (six) hours as needed for pain. 45 tablet 0   No current facility-administered medications for this visit.    Allergies:   Iodinated diagnostic agents and Sulfa antibiotics   Past Medical History  Diagnosis Date  . Essential hypertension   . Hyperlipidemia   . Morbid obesity ([place])   . GERD (gastroesophageal reflux disease)   . Anemia   . History of blood transfusion     "related to ruptured tubal pregnancy"  . Cataract   . Family history of adverse reaction to anesthesia     "oldest sister, [name], related to brain formation at back of head; when they put her to sleep it's hard to wake her up"  . Type II diabetes mellitus ([place])   .  Arthritis     "right knee" (10/21/2015)  . CAD (coronary artery disease)     a. Cath 10/21/15: s/p DES to RCA: moderate diffuse stenosis of mod diagonal, diffuse irregularity LCx and LAD. 2D echo 10/21/15: mod LVH, EF 60-65%, no RWMA, calcified MV, mod-severe LAE.  [name] Hypertensive heart disease     Past Surgical History  Procedure Laterality Date  . Bariatric surgery  1984  . Ectopic pregnancy surgery    . Appendectomy  1985  . Abdominal hysterectomy  1988  . Cardiac catheterization N/A 10/21/2015    Procedure: Right/Left Heart Cath and Coronary Angiography;  Surgeon: [name], MD;  Location: [facility];  Service: Cardiovascular;  Laterality: N/A;  . Tonsillectomy    . Cholecystectomy open  1984  . Reduction mammaplasty Bilateral ~ 1976     Social History:  The patient  reports that she has never smoked. She has never used smokeless tobacco. She  reports that she does not drink alcohol or use illicit drugs.   Family History:  The patient's family history includes Diabetes in her mother, sister, sister, and sister; Heart attack in her father and mother; Heart disease in her mother; Hyperlipidemia in her father and mother; Hypertension in her father and mother; Stomach cancer in her sister; Stroke in her father.    ROS:  Please see the history of present illness. All other systems are reviewed and  Negative to the above problem except as noted.    PHYSICAL EXAM: VS:  Pulse 77  Ht 5\' 5"  (1.651 m)  Wt 133.267 kg (293 lb 12.8 oz)  BMI 48.89 kg/m2  SpO2 99%  GEN:  Morbidly obese 69 yo, in no acute distress HEENT: normal Neck: no JVD, carotid bruits, or masses Cardiac: RRR; no murmurs, rubs, or gallops,no edema  Respiratory:  clear to auscultation bilaterally, normal work of breathing GI: soft, nontender, nondistended, + BS  No hepatomegaly  MS: no deformity Moving all extremities   Skin: warm and dry, no rash Neuro:  Strength and sensation are intact Psych: euthymic mood, full affect   EKG:  EKG is ordered today.   SB 53 bpm  T wave inversion III, AVF,  (old)   Lipid Panel    Component Value Date/Time   CHOL 208* 10/10/2015 1538   TRIG 47.0 10/10/2015 1538   HDL 69.10 10/10/2015 1538   CHOLHDL 3 10/10/2015 1538   VLDL 9.4 10/10/2015 1538   LDLCALC 130* 10/10/2015 1538      Wt Readings from Last 3 Encounters:  11/04/15 133.267 kg (293 lb 12.8 oz)  10/27/15 134.537 kg (296 lb 9.6 oz)  10/22/15 136.578 kg (301 lb 1.6 oz)      ASSESSMENT AND PLAN:  1  CAD  Recent intervention  EKG with no acute changes . I am not convinced SOB is angina.  Will check labs  Keep on lasix.  2.  HTN  Adequate control  3.  HL  Continue lipitor  Signed, [name], MD  11/04/2015 9:23 AM    [address], [place], [place]  [address] Phone: [phone]; Fax: [phone]

## 2015-11-04 NOTE — Patient Instructions (Signed)
Your physician recommends that you continue on your current medications as directed. Please refer to the Current Medication list given to you today. Your physician recommends that you return for lab work in: TODAY (BMET, BNP, CBC, ANEMIA PANEL)

## 2015-11-10 ENCOUNTER — Other Ambulatory Visit (INDEPENDENT_AMBULATORY_CARE_PROVIDER_SITE_OTHER): Payer: Medicare Other | Admitting: *Deleted

## 2015-11-10 DIAGNOSIS — I2510 Atherosclerotic heart disease of native coronary artery without angina pectoris: Secondary | ICD-10-CM

## 2015-11-10 DIAGNOSIS — Z9861 Coronary angioplasty status: Secondary | ICD-10-CM

## 2015-11-10 DIAGNOSIS — R0602 Shortness of breath: Secondary | ICD-10-CM

## 2015-11-10 DIAGNOSIS — I10 Essential (primary) hypertension: Secondary | ICD-10-CM

## 2015-11-10 LAB — CBC
HEMATOCRIT: 31.1 % — AB (ref 36.0–46.0)
HEMOGLOBIN: 9.6 g/dL — AB (ref 12.0–15.0)
MCH: 21.5 pg — ABNORMAL LOW (ref 26.0–34.0)
MCHC: 30.9 g/dL (ref 30.0–36.0)
MCV: 69.7 fL — ABNORMAL LOW (ref 78.0–100.0)
Platelets: 235 10*3/uL (ref 150–400)
RBC: 4.46 MIL/uL (ref 3.87–5.11)
RDW: 18.8 % — ABNORMAL HIGH (ref 11.5–15.5)
WBC: 4.4 10*3/uL (ref 4.0–10.5)

## 2015-11-10 LAB — BASIC METABOLIC PANEL
BUN: 21 mg/dL (ref 7–25)
CALCIUM: 8.9 mg/dL (ref 8.6–10.4)
CO2: 30 mmol/L (ref 20–31)
CREATININE: 1.4 mg/dL — AB (ref 0.50–0.99)
Chloride: 102 mmol/L (ref 98–110)
Glucose, Bld: 88 mg/dL (ref 65–99)
Potassium: 3.9 mmol/L (ref 3.5–5.3)
SODIUM: 140 mmol/L (ref 135–146)

## 2015-11-10 LAB — HEPATIC FUNCTION PANEL
ALT: 13 U/L (ref 6–29)
AST: 17 U/L (ref 10–35)
Albumin: 3.7 g/dL (ref 3.6–5.1)
Alkaline Phosphatase: 76 U/L (ref 33–130)
BILIRUBIN DIRECT: 0.2 mg/dL (ref <=0.2)
BILIRUBIN INDIRECT: 0.3 mg/dL (ref 0.2–1.2)
BILIRUBIN TOTAL: 0.5 mg/dL (ref 0.2–1.2)
TOTAL PROTEIN: 6.5 g/dL (ref 6.1–8.1)

## 2015-11-16 ENCOUNTER — Other Ambulatory Visit: Payer: Medicare Other

## 2015-11-16 ENCOUNTER — Other Ambulatory Visit (INDEPENDENT_AMBULATORY_CARE_PROVIDER_SITE_OTHER): Payer: Medicare Other

## 2015-11-16 ENCOUNTER — Other Ambulatory Visit: Payer: Self-pay | Admitting: *Deleted

## 2015-11-16 DIAGNOSIS — N289 Disorder of kidney and ureter, unspecified: Secondary | ICD-10-CM | POA: Diagnosis not present

## 2015-11-16 DIAGNOSIS — D509 Iron deficiency anemia, unspecified: Secondary | ICD-10-CM

## 2015-11-16 DIAGNOSIS — R0602 Shortness of breath: Secondary | ICD-10-CM

## 2015-11-16 LAB — BASIC METABOLIC PANEL
BUN: 27 mg/dL — AB (ref 6–23)
CHLORIDE: 106 meq/L (ref 96–112)
CO2: 27 meq/L (ref 19–32)
CREATININE: 1.47 mg/dL — AB (ref 0.40–1.20)
Calcium: 9.4 mg/dL (ref 8.4–10.5)
GFR: 45.44 mL/min — ABNORMAL LOW (ref >60.00)
GLUCOSE: 117 mg/dL — AB (ref 70–99)
Potassium: 4 mEq/L (ref 3.5–5.1)
SODIUM: 142 meq/L (ref 135–145)

## 2015-11-17 ENCOUNTER — Encounter (HOSPITAL_COMMUNITY)
Admission: RE | Admit: 2015-11-17 | Discharge: 2015-11-17 | Disposition: A | Discharge status: Home / Self Care | Payer: Medicare Other | Source: Ambulatory Visit | Attending: Internal Medicine | Admitting: Internal Medicine

## 2015-11-17 NOTE — Progress Notes (Signed)
Cardiac Rehab Medication Review by a Pharmacist  Does the patient  feel that his/her medications are working for him/her?  yes  Has the patient been experiencing any side effects to the medications prescribed?  no  Does the patient measure his/her own blood pressure or blood glucose at home?  yes   Does the patient have any problems obtaining medications due to transportation or finances?   no  Understanding of regimen: excellent Understanding of indications: good Potential of compliance: excellent    Pharmacist comments: Pt demonstrated no cost or adherence barriers.  Pt had no current Side effects to the medications.  Pt stated when started new meds after her stent that she had n/v but now those SE have resolved.     Alicia [name] 11/17/2015 8:39 AM

## 2015-11-18 ENCOUNTER — Telehealth: Payer: Self-pay | Admitting: Internal Medicine

## 2015-11-18 DIAGNOSIS — I10 Essential (primary) hypertension: Secondary | ICD-10-CM

## 2015-11-18 MED ORDER — FUROSEMIDE 20 MG PO TABS: 20.0000 mg | ORAL_TABLET | Freq: Two times a day (BID) | ORAL | Status: DC

## 2015-11-18 NOTE — Telephone Encounter (Signed)
New Message  Pt calling to speak w/ rn- pt stated that at latest PCP ov- pt was told to f/u w/ cardiology due to recent lab work. Please call back and discuss.

## 2015-11-18 NOTE — Telephone Encounter (Signed)
Informed patient that notes on recent labs from PCP were seen.  Informed patient that Dr. Harrington Challenger is not in the office, but lab work will be reviewed by DOD and she will be called with instruction. Patient agrees with treatment plan.

## 2015-11-18 NOTE — Telephone Encounter (Signed)
Per Dr. Angelena Form, instructed patient to DECREASE LASIX to 20 mg BID. Repeat BMET scheduled for 1/6. Patient agrees with treatment plan.

## 2015-11-22 ENCOUNTER — Other Ambulatory Visit (INDEPENDENT_AMBULATORY_CARE_PROVIDER_SITE_OTHER): Payer: Medicare Other | Admitting: *Deleted

## 2015-11-22 DIAGNOSIS — I10 Essential (primary) hypertension: Secondary | ICD-10-CM | POA: Diagnosis not present

## 2015-11-22 LAB — BASIC METABOLIC PANEL
BUN: 16 mg/dL (ref 7–25)
CHLORIDE: 106 mmol/L (ref 98–110)
CO2: 27 mmol/L (ref 20–31)
Calcium: 8.7 mg/dL (ref 8.6–10.4)
Creat: 1.26 mg/dL — ABNORMAL HIGH (ref 0.50–0.99)
GLUCOSE: 96 mg/dL (ref 65–99)
POTASSIUM: 4.2 mmol/L (ref 3.5–5.3)
SODIUM: 142 mmol/L (ref 135–146)

## 2015-11-23 ENCOUNTER — Encounter (HOSPITAL_COMMUNITY): Payer: Self-pay

## 2015-11-23 ENCOUNTER — Encounter (HOSPITAL_COMMUNITY)
Admission: RE | Admit: 2015-11-23 | Discharge: 2015-11-23 | Disposition: A | Discharge status: Home / Self Care | Payer: Medicare Other | Source: Ambulatory Visit | Attending: Internal Medicine | Admitting: Internal Medicine

## 2015-11-23 DIAGNOSIS — I2510 Atherosclerotic heart disease of native coronary artery without angina pectoris: Secondary | ICD-10-CM | POA: Insufficient documentation

## 2015-11-23 DIAGNOSIS — E785 Hyperlipidemia, unspecified: Secondary | ICD-10-CM | POA: Insufficient documentation

## 2015-11-23 DIAGNOSIS — I10 Essential (primary) hypertension: Secondary | ICD-10-CM | POA: Insufficient documentation

## 2015-11-23 DIAGNOSIS — Z955 Presence of coronary angioplasty implant and graft: Secondary | ICD-10-CM | POA: Insufficient documentation

## 2015-11-23 DIAGNOSIS — Z79899 Other long term (current) drug therapy: Secondary | ICD-10-CM | POA: Insufficient documentation

## 2015-11-23 LAB — GLUCOSE, CAPILLARY
GLUCOSE-CAPILLARY: 107 mg/dL — AB (ref 65–99)
GLUCOSE-CAPILLARY: 128 mg/dL — AB (ref 65–99)

## 2015-11-23 NOTE — Progress Notes (Signed)
Pt started cardiac rehab today.  Pt tolerated light exercise without difficulty. VSS, telemetry-sinus rhythm, asymptomatic.  Medication list reconciled.  Pt verbalized compliance with medications and denies barriers to compliance. PSYCHOSOCIAL ASSESSMENT:  PHQ-0. Pt exhibits positive coping skills, hopeful outlook with supportive family. No psychosocial needs identified at this time, no psychosocial interventions necessary.    Pt enjoys writing, reading and playing video games.  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   Pt cardiac rehab  goal is  to increase energy and stamina.  Pt would like to be able to perform her daily tasks with more ease.  Pt encouraged to participate in home exercise in addition to cardiac rehab  to increase ability to achieve these goals.   Pt long term cardiac rehab goal is lose weight, pt encouraged to participate in nutrition classes.  Pt oriented to exercise equipment and routine.  Understanding verbalized.

## 2015-11-25 ENCOUNTER — Other Ambulatory Visit: Payer: Medicare Other

## 2015-11-25 ENCOUNTER — Other Ambulatory Visit: Payer: Self-pay | Admitting: *Deleted

## 2015-11-25 ENCOUNTER — Telehealth: Payer: Self-pay | Admitting: *Deleted

## 2015-11-25 ENCOUNTER — Encounter (HOSPITAL_COMMUNITY)
Admission: RE | Admit: 2015-11-25 | Discharge: 2015-11-25 | Disposition: A | Discharge status: Home / Self Care | Payer: Medicare Other | Source: Ambulatory Visit | Attending: Internal Medicine | Admitting: Internal Medicine

## 2015-11-25 ENCOUNTER — Other Ambulatory Visit: Payer: Self-pay

## 2015-11-25 DIAGNOSIS — Z955 Presence of coronary angioplasty implant and graft: Secondary | ICD-10-CM | POA: Diagnosis not present

## 2015-11-25 DIAGNOSIS — I10 Essential (primary) hypertension: Secondary | ICD-10-CM

## 2015-11-25 MED ORDER — CLOPIDOGREL BISULFATE 75 MG PO TABS: 75.0000 mg | ORAL_TABLET | Freq: Every day | ORAL | Status: DC

## 2015-11-25 MED ORDER — LOSARTAN POTASSIUM 100 MG PO TABS: ORAL_TABLET | ORAL | Status: DC

## 2015-11-25 NOTE — Telephone Encounter (Signed)
Please call patient and Alicia [name].  Is this the first time this has occurred? Or has this occurred multiple times? Did she eat prior to her cardiac rehab appointment or before taking her oral hypoglycemics? She must have something to eat when taking these medications.  Her A1C was worse in December which is why Glipizide was added.

## 2015-11-25 NOTE — Telephone Encounter (Signed)
Spoke with pt; this is the first time this has happened/ pt did eat but only at bowl of cereal at 11AM; pt took Januvia and glipizide at 9 Am and took Victoza when ate cereal at 11 AM./ pt feels good now.  Pt will eat more for breakfast including a protein and will cb to [facility] if occurs again.

## 2015-11-25 NOTE — Telephone Encounter (Signed)
Noted. Will route to PCP for FYI.  

## 2015-11-25 NOTE — Telephone Encounter (Signed)
Voice mail from [place] at [facility].  Patient experience a hypoglycemic episode at her cardiac rehab appointment today.  CBG 62.  Please call [place] at Cardiac Rehab regarding possible medication changes.  Rollene Fare is out of the office, routed to Allie Bossier, NP).

## 2015-11-25 NOTE — Progress Notes (Signed)
Pt CBG-98 pre exercise at cardiac rehab.  Pt had eaten bread and banana prior to arrival.  Pt given peanut butter crackers and lemonade.  Post exercise CBG-62.  Pt asymptomatic.  Pt given one tube glucose gel and  8 oz gingerale.   15 minute recheck CBG:  63.  Pt given glucose gel and banana.  15 minute recheck CBG:  96.  Phone call to Golden Hurter, NP office.  LM for triage nurse to return call. Inbasket message sent to Golden Hurter, NP.    Pt given instructions to add protein to meals especially prior to activity. Pt given Diabetic Meal Planning Prior to Exercise Handout.  Pt verbalized understanding.

## 2015-11-28 ENCOUNTER — Telehealth (HOSPITAL_COMMUNITY): Payer: Self-pay | Admitting: Cardiac Rehabilitation

## 2015-11-28 ENCOUNTER — Encounter (HOSPITAL_COMMUNITY): Admission: RE | Admit: 2015-11-28 | Payer: Medicare Other | Source: Ambulatory Visit

## 2015-11-28 LAB — GLUCOSE, CAPILLARY
GLUCOSE-CAPILLARY: 62 mg/dL — AB (ref 65–99)
GLUCOSE-CAPILLARY: 63 mg/dL — AB (ref 65–99)
GLUCOSE-CAPILLARY: 96 mg/dL (ref 65–99)
Glucose-Capillary: 98 mg/dL (ref 65–99)

## 2015-11-28 NOTE — Telephone Encounter (Signed)
Pt made aware. Understanding verbalized.

## 2015-11-28 NOTE — Telephone Encounter (Signed)
-----   Message from Jearld Fenton, NP sent at 11/28/2015 11:07 AM EST ----- Regarding: RE: cardiac  rehab She was advised at her last visit to stop Januvia.  ----- Message -----    From: Lowell Guitar, RN    Sent: 11/25/2015   4:39 PM      To: Jearld Fenton, NP Subject: cardiac  rehab                                 Dear Rollene Fare,  Pt CBG-98 pre exercise at cardiac rehab.  Pt had eaten bread and banana prior to arrival.  Pt given peanut butter crackers and lemonade.  Post exercise CBG-62.  Pt asymptomatic.  Pt given one tube glucose gel and  8 oz gingerale.  Pt c/o weakness otherwise asymptomatic.   15 minute recheck CBG:  63.  Pt given glucose gel and banana.  15 minute recheck CBG:  96.  Phone call to Golden Hurter, NP office.  LM for triage nurse to return call, however did not hear back.   Pt also reports she restarted [place] 2 days ago, she had previously held it due to expense.    Do you have any recommendations?  Pt is taking [place], glipzide and victoza.  Most recent HGB A1C-6.0.  Thank you, Andi Hence, RN, BSN Cardiac Pulmonary Rehab

## 2015-11-29 MED FILL — Glucose Gel 40%: ORAL | Qty: 1 | Status: AC

## 2015-11-30 ENCOUNTER — Encounter (HOSPITAL_COMMUNITY)
Admission: RE | Admit: 2015-11-30 | Discharge: 2015-11-30 | Disposition: A | Discharge status: Home / Self Care | Payer: Medicare Other | Source: Ambulatory Visit | Attending: Internal Medicine | Admitting: Internal Medicine

## 2015-11-30 DIAGNOSIS — Z955 Presence of coronary angioplasty implant and graft: Secondary | ICD-10-CM | POA: Diagnosis not present

## 2015-11-30 LAB — GLUCOSE, CAPILLARY: GLUCOSE-CAPILLARY: 121 mg/dL — AB (ref 65–99)

## 2015-12-02 ENCOUNTER — Encounter (HOSPITAL_COMMUNITY): Payer: Medicare Other

## 2015-12-02 ENCOUNTER — Telehealth: Payer: Self-pay

## 2015-12-02 NOTE — Telephone Encounter (Signed)
Ok to keep current appt. Monitor BS for low's

## 2015-12-02 NOTE — Telephone Encounter (Signed)
Pt is aware---pt has 6 mth f/u scheduled for 03/2016 already and has appts for card rehad up until 02/2016 at this point---is it okay for her to just keep her 6 mth f/u appt it will extend f/u to 4 mths instead of 3---please advise if okay with arrangement

## 2015-12-02 NOTE — Telephone Encounter (Signed)
-----   Message from Jearld Fenton, NP sent at 11/28/2015 11:08 AM EST ----- Regarding: FW: cardiac  rehab Please call pt, have her stop [place]. Follow up in 3 months ----- Message -----    From: Lowell Guitar, RN    Sent: 11/25/2015   4:39 PM      To: Jearld Fenton, NP Subject: cardiac  rehab                                 Dear Rollene Fare,  Pt CBG-98 pre exercise at cardiac rehab.  Pt had eaten bread and banana prior to arrival.  Pt given peanut butter crackers and lemonade.  Post exercise CBG-62.  Pt asymptomatic.  Pt given one tube glucose gel and  8 oz gingerale.  Pt c/o weakness otherwise asymptomatic.   15 minute recheck CBG:  63.  Pt given glucose gel and banana.  15 minute recheck CBG:  96.  Phone call to Golden Hurter, NP office.  LM for triage nurse to return call, however did not hear back.   Pt also reports she restarted [place] 2 days ago, she had previously held it due to expense.    Do you have any recommendations?  Pt is taking [place], glipzide and victoza.  Most recent HGB A1C-6.0.  Thank you, Andi Hence, RN, BSN Cardiac Pulmonary Rehab

## 2015-12-05 ENCOUNTER — Encounter (HOSPITAL_COMMUNITY): Payer: Medicare Other

## 2015-12-07 ENCOUNTER — Encounter (HOSPITAL_COMMUNITY)
Admission: RE | Admit: 2015-12-07 | Discharge: 2015-12-07 | Disposition: A | Discharge status: Home / Self Care | Payer: Medicare Other | Source: Ambulatory Visit | Attending: Internal Medicine | Admitting: Internal Medicine

## 2015-12-07 ENCOUNTER — Other Ambulatory Visit: Payer: Self-pay | Admitting: Physician Assistant

## 2015-12-07 ENCOUNTER — Telehealth: Payer: Self-pay | Admitting: Physician Assistant

## 2015-12-07 DIAGNOSIS — Z955 Presence of coronary angioplasty implant and graft: Secondary | ICD-10-CM | POA: Diagnosis not present

## 2015-12-07 LAB — GLUCOSE, CAPILLARY
GLUCOSE-CAPILLARY: 100 mg/dL — AB (ref 65–99)
Glucose-Capillary: 88 mg/dL (ref 65–99)

## 2015-12-07 MED ORDER — CARVEDILOL 25 MG PO TABS: 25.0000 mg | ORAL_TABLET | Freq: Two times a day (BID) | ORAL | Status: DC

## 2015-12-07 NOTE — Telephone Encounter (Signed)
     Cardiac rehab called me about patient having issues with elevated BPs. Elevated at baseline and went up to [id number] systolic. After rest it improved to 160. Her resting HR is in the 80s. I have asked her to increase her Coreg from 12.5 BID to 25mg  BID. I have sent a message to the schedulers at our office to have her seen next week by a Pharm D in the HTN clinic to make sure her pressures are better controlled. Patient agreed with plan  Angelena Form PA-C  [facility]

## 2015-12-07 NOTE — Progress Notes (Signed)
Pt arrived at cardiac rehab.  Pre-exercise CBG-88.  Pt ate eggs, sausage and toast this morning.  She ate banana and raisins on way to rehab.  Pt took medications as prescribed.  Pt has been holding Januvia per Golden Hurter, NP recommendation.  Pt given lemonade and graham crackers.  Pt asymptomatic.  Post exercise recheck CBG-100.  Pt instructed to hold AM glipizide on exercise days.   During exercise, BP-224/104.  Recheck 190/100.  Pt asymptomatic.  PC to Nell Range, NP.  Verbal order given for pt to increase carvedilol to 25mg  BID.  Joellen Jersey will notify office to schedule pt in HTN clinic next week and will call in new rx to pt pharmacy. Pt instructed to increase carvedilol, advised to take 12.5mg  2 tabs twice daily until she picks up new prescription.  Recheck BP:  160/100.  Exit  BP:  150/90.  Written and verbal instructions given.  Pt verbalized understanding.

## 2015-12-09 ENCOUNTER — Telehealth (HOSPITAL_COMMUNITY): Payer: Self-pay | Admitting: Cardiac Rehabilitation

## 2015-12-09 ENCOUNTER — Encounter (HOSPITAL_COMMUNITY)
Admission: RE | Admit: 2015-12-09 | Discharge: 2015-12-09 | Disposition: A | Discharge status: Home / Self Care | Payer: Medicare Other | Source: Ambulatory Visit | Attending: Internal Medicine | Admitting: Internal Medicine

## 2015-12-09 DIAGNOSIS — Z955 Presence of coronary angioplasty implant and graft: Secondary | ICD-10-CM | POA: Diagnosis not present

## 2015-12-09 LAB — GLUCOSE, CAPILLARY
Glucose-Capillary: 121 mg/dL — ABNORMAL HIGH (ref 65–99)
Glucose-Capillary: 168 mg/dL — ABNORMAL HIGH (ref 65–99)

## 2015-12-09 NOTE — Telephone Encounter (Signed)
-----   Message from Jearld Fenton, NP sent at 12/07/2015  7:49 PM EST ----- Regarding: RE: cardiac rehab  Yes, I agree with recommendations. We will also schedule her for followup. ----- Message -----    From: Lowell Guitar, RN    Sent: 12/07/2015   4:49 PM      To: Jearld Fenton, NP Subject: cardiac rehab                                  Dear Rollene Fare,  [place] CBG-88 upon arrival to cardiac rehab today. Pt had eaten on the way to rehab.  Our protocol is for pt on sulfanureas to have CBG >110 prior to exercise.  I have asked pt to hold AM glipizide on exercise days to see if this will allow her to be able to exercise.  Her last HGB A1C was 6.  She is also holding her [place] as directed.   Do you agree?  Are there any other recommendations?  Thank you,  Andi Hence, RN, BSN Cardiac Pulmonary Rehab

## 2015-12-09 NOTE — Progress Notes (Signed)
Pt arrived at cardiac rehab reporting she held her victoza instead of glipizide as instructed.  Pre exercise CBG-168.  Pt had eaten hot dog, cheese grits and banana for breakfast.  Pt had banana and crackers on way to rehab.  Pt exercised without difficulty.  Post exercise CBG-121.  Pt again instructed to hold AM glipizide on exercise days and take victoza due to benefits victoza provides. Pt verbalized understanding.

## 2015-12-12 ENCOUNTER — Ambulatory Visit: Payer: Medicare Other | Admitting: Pharmacist

## 2015-12-12 ENCOUNTER — Encounter (HOSPITAL_COMMUNITY): Payer: Medicare Other

## 2015-12-14 ENCOUNTER — Encounter (HOSPITAL_COMMUNITY)
Admission: RE | Admit: 2015-12-14 | Discharge: 2015-12-14 | Disposition: A | Discharge status: Home / Self Care | Payer: Medicare Other | Source: Ambulatory Visit | Attending: Internal Medicine | Admitting: Internal Medicine

## 2015-12-14 DIAGNOSIS — Z955 Presence of coronary angioplasty implant and graft: Secondary | ICD-10-CM | POA: Diagnosis not present

## 2015-12-14 LAB — GLUCOSE, CAPILLARY
GLUCOSE-CAPILLARY: 165 mg/dL — AB (ref 65–99)
GLUCOSE-CAPILLARY: 130 mg/dL — AB (ref 65–99)

## 2015-12-15 ENCOUNTER — Ambulatory Visit (INDEPENDENT_AMBULATORY_CARE_PROVIDER_SITE_OTHER): Payer: Medicare Other | Admitting: Pharmacist

## 2015-12-15 VITALS — BP 142/82 | HR 72

## 2015-12-15 DIAGNOSIS — I208 Other forms of angina pectoris: Secondary | ICD-10-CM

## 2015-12-15 DIAGNOSIS — I10 Essential (primary) hypertension: Secondary | ICD-10-CM

## 2015-12-15 NOTE — Progress Notes (Signed)
Patient ID:  Alicia [name]                DOB:  1946/03/20                        MRN:  [id number]     HPI: Alicia [name] is a 70 y.o. female referred by Dr. Harrington Challenger to HTN clinic.  Her carvedilol was recently increased to 25mg  twice daily after an episode of high blood pressure at cardiac rehab. The patient states that she had just finished riding the bike when her pressures were high. She also reports that she found her spironolactone on the floor after getting home from cardiac rehab that day.   Current HTN meds:  Carvedilol 25mg  BID Doxazosin 4mg  daily Furosemide 20mg  BID Losartan 100mg  daily Spironolactone 25mg  daily  Previously tried:  BP goal: <140/90  Family History: The patient's family history includes Diabetes in her mother and sisters; Heart attack in her father and mother; Heart disease in her mother; Hyperlipidemia in her father and mother; Hypertension in her father and mother; Stomach cancer in her sister; Stroke in her father.   Social History: never smoker  Diet: She reports she prepares most of her meals at home. She is mindful of sodium intake and uses as little salt as possible.   Exercise: She does not do any formal exercise outside of cardiac rehab. 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  Home BP readings:  She has a wrist cuff at home but has not been monitoring her blood pressures.    Wt Readings from Last 3 Encounters:  11/17/15 296 lb 15.4 oz (134.7 kg)  11/04/15 293 lb 12.8 oz (133.267 kg)  10/27/15 296 lb 9.6 oz (134.537 kg)   BP Readings from Last 3 Encounters:  12/15/15 142/82  11/17/15 148/90  11/04/15 134/68   Pulse Readings from Last 3 Encounters:  12/15/15 72  11/17/15 79  11/04/15 77    Renal function: CrCl cannot be calculated (Unknown ideal weight.).  Past Medical History  Diagnosis Date  . Essential hypertension   . Hyperlipidemia   . Morbid obesity ([place])   . GERD (gastroesophageal reflux disease)   . Anemia    . History of blood transfusion     "related to ruptured tubal pregnancy"  . Cataract   . Family history of adverse reaction to anesthesia     "oldest sister, Alicia [name], related to brain formation at back of head; when they put her to sleep it's hard to wake her up"  . Type II diabetes mellitus ([place])   . Arthritis     "right knee" (10/21/2015)  . CAD (coronary artery disease)     a. Cath 10/21/15: s/p DES to RCA: moderate diffuse stenosis of mod diagonal, diffuse irregularity LCx and LAD. 2D echo 10/21/15: mod LVH, EF 60-65%, no RWMA, calcified MV, mod-severe LAE.  [name] Hypertensive heart disease     Current Outpatient Prescriptions on File Prior to Visit  Medication Sig Dispense Refill  . aspirin 81 MG tablet Take 81 mg by mouth daily.    [name] atorvastatin (LIPITOR) 80 MG tablet Take 1 tablet (80 mg total) by mouth every evening. 30 tablet 6  . carvedilol (COREG) 25 MG tablet Take 1 tablet (25 mg total) by mouth 2 (two) times daily. 60 tablet 3  . clopidogrel (PLAVIX) 75 MG tablet Take 1 tablet (75 mg total) by mouth daily. [place]  tablet 3  . DiphenhydrAMINE HCl, Sleep, (UNISOM SLEEPGELS PO) Take 1 tablet by mouth at bedtime as needed (SLEEP).    [name] doxazosin (CARDURA) 4 MG tablet Take 1 tablet (4 mg total) by mouth at bedtime. 90 tablet 1  . ferrous sulfate 325 (65 FE) MG EC tablet Take 1 tablet (325 mg total) by mouth 3 (three) times daily with meals. 60 tablet 3  . furosemide (LASIX) 20 MG tablet Take 1 tablet (20 mg total) by mouth 2 (two) times daily. 30 tablet 3  . glipiZIDE (GLUCOTROL) 5 MG tablet Take 1 tablet (5 mg total) by mouth 2 (two) times daily before a meal. 180 tablet 0  . Insulin Pen Needle 31G X 5 MM MISC 1 each by Does not apply route daily as needed. 100 each 6  . Liraglutide (VICTOZA) 18 MG/3ML SOPN Inject 0.2 mLs (1.2 mg total) into the skin daily. 6 pen 1  . losartan (COZAAR) 100 MG tablet TAKE 1 TABLET (100 MG TOTAL) BY MOUTH DAILY. 90 tablet 1  . Multiple Vitamins-Minerals  (MULTIVITAMIN WITH MINERALS) tablet Take 1 tablet by mouth daily.    . nitroGLYCERIN (NITROSTAT) 0.4 MG SL tablet Place 1 tablet (0.4 mg total) under the tongue every 5 (five) minutes as needed for chest pain (up to 3 doses). 25 tablet 3  . spironolactone (ALDACTONE) 25 MG tablet Take 1 tablet (25 mg total) by mouth daily. 90 tablet 1  . traMADol (ULTRAM) 50 MG tablet Take 1-2 tablets (50-100 mg total) by mouth every 6 (six) hours as needed for pain. (Patient not taking: Reported on 12/15/2015) 45 tablet 0   No current facility-administered medications on file prior to visit.    Allergies  Allergen Reactions  . Iodinated Diagnostic Agents Hives  . Sulfa Antibiotics Hives     Assessment/Plan: Hypertension: Blood pressure is borderline controlled today in office. She is on multiple medications for hypertension. Instructed her to start monitoring at home or local pharmacy (recommended arm cuff rather than wrist cuff if possible) and bring record to next visit. At next visit consider adding amlodipine if continues to be borderline controlled. Follow-up in hypertension clinic in 4 weeks.    Thank you, Alicia [name]. Alicia [name], [place]  [id number] [place], [place] [address]  Phone: [phone]; Fax: [phone] 12/15/2015 10:19 AM

## 2015-12-15 NOTE — Patient Instructions (Signed)
Thank you for coming to see me today!  Try to monitor your blood pressure at home or a local pharmacy. Bring the record with you at your next visit. If you have any symptoms of high blood pressure or notice your pressures running greater than 140/90 consistently please call the clinic at [phone].   Continue to take your medications as prescribed. Follow-up in blood pressure clinic in 1 month (Feb 23rd at 11:30).

## 2015-12-16 ENCOUNTER — Encounter (HOSPITAL_COMMUNITY)
Admission: RE | Admit: 2015-12-16 | Discharge: 2015-12-16 | Disposition: A | Discharge status: Home / Self Care | Payer: Medicare Other | Source: Ambulatory Visit | Attending: Internal Medicine | Admitting: Internal Medicine

## 2015-12-16 DIAGNOSIS — Z955 Presence of coronary angioplasty implant and graft: Secondary | ICD-10-CM | POA: Diagnosis not present

## 2015-12-16 LAB — GLUCOSE, CAPILLARY
GLUCOSE-CAPILLARY: 278 mg/dL — AB (ref 65–99)
GLUCOSE-CAPILLARY: 149 mg/dL — AB (ref 65–99)

## 2015-12-16 NOTE — Progress Notes (Signed)
Alicia [name] 70 y.o. female Nutrition Note Spoke with pt. Nutrition Plan and Nutrition Survey goals reviewed with pt. Pt is following Step 1 of the Therapeutic Lifestyle Changes diet. Pt wants to lose wt. Pt has been trying to lose wt by changing her diet over the past year. Pt states she lost 140 lb with her Gastric Bypass surgery 30+ years ago. Pt highest wt before gastric bypass reportedly 288 lb. Pt feel wt gain due to not making lifestyle changes after her surgery. Wt loss tips reviewed. Pt is diabetic. Last A1c indicates blood glucose well-controlled. This Probation officer went over Diabetes Education test results. Pt pre-exercise CBG's have been below desired goal so pt held Glipizide today. Pre-exercise CBG today 278 mg/dL. Pt states she ate eggs, liver pudding, toast, and grape juice. Post-exercise CBG 149 mg/dL. Healthier pre-exercise meal choices discussed. Pt expressed understanding of the information reviewed. Pt aware of nutrition education classes offered and is unable to attend nutrition classes due to pt takes care of her sister, who recently had a CVA, and a grandchild.  Lab Results  Component Value Date   HGBA1C 6.0 10/10/2015    Nutrition Diagnosis ? Food-and nutrition-related knowledge deficit related to lack of exposure to information as related to diagnosis of: ? CVD ? DM  ? Obesity related to excessive energy intake as evidenced by a BMI of 47.9  Nutrition RX/ Estimated Daily Nutrition Needs for: wt loss 1650-2150 Kcal, 45-55 gm fat, 11-17 gm sat fat, 1.6-2.2 gm trans-fat, <1500 mg sodium, 175-250 gm CHO   Nutrition Intervention ? Pt's individual nutrition plan reviewed with pt. ? Benefits of adopting Therapeutic Lifestyle Changes discussed when Medficts reviewed. ? Pt to attend the Portion Distortion class ? Pt to attend the Diabetes Q & A class ? Pt given handouts for: ? Nutrition I class ? Nutrition II class ? Diabetes Blitz Class ? Will monitor pre-and post-exercise CBG's  with team and fax MD re: DM medication recommendations prn ? Continue client-centered nutrition education by RD, as part of interdisciplinary care. Goal(s) ? Pt to identify and limit food sources of saturated fat, trans fat, and sodium ? Pt to identify food quantities necessary to achieve: ? wt loss to a goal wt loss of 6-24 lb (2.7-10.9 kg) at graduation from cardiac rehab.  ? CBG concentrations in the normal range or as close to normal as is safely possible. Monitor and Evaluate progress toward nutrition goal with team. Nutrition Risk: Change to Moderate Derek M[place], M.Ed, RD, LDN, CDE 12/16/2015 2:40 PM

## 2015-12-16 NOTE — Progress Notes (Signed)
No psychosocial needs identified, no intervention necessary. 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 However pt exhibits positive outlook and good coping skills.  Pt glycemic index is stabalizing making it easier for her to participate in cardiac rehab.  Pt did hold her glucotrol today and took her victoza.  Pre exercise CBG-278.  Post exercise CBG-149.  Pt had nutritional and diabetes educator consult today.  Will continue to monitor.

## 2015-12-19 ENCOUNTER — Encounter (HOSPITAL_COMMUNITY)
Admission: RE | Admit: 2015-12-19 | Discharge: 2015-12-19 | Disposition: A | Discharge status: Home / Self Care | Payer: Medicare Other | Source: Ambulatory Visit | Attending: Internal Medicine | Admitting: Internal Medicine

## 2015-12-19 DIAGNOSIS — Z955 Presence of coronary angioplasty implant and graft: Secondary | ICD-10-CM | POA: Diagnosis not present

## 2015-12-19 LAB — GLUCOSE, CAPILLARY
GLUCOSE-CAPILLARY: 177 mg/dL — AB (ref 65–99)
GLUCOSE-CAPILLARY: 99 mg/dL (ref 65–99)

## 2015-12-19 NOTE — Progress Notes (Signed)
Pt arrived at cardiac rehab reporting she picked up coreg 6.25mg  that was automatically filled for [facility] at [facility] on Friday. Pt reports she was unable to call the office over the weekend to verify this dosage so she took it instead of Coreg 25mg .  Pt mildly hypertensive at cardiac rehab.  Verified correct dosage should be coreg 25mg  BID which was increased by  Dr. Harrington Challenger on 12/07/2015.  [facility] to [facility] to inform to remove coreg 6.25 and 12.5mg  from pt auto refill.  Pharmacist verbalized understanding.

## 2015-12-21 ENCOUNTER — Encounter (HOSPITAL_COMMUNITY)
Admission: RE | Admit: 2015-12-21 | Discharge: 2015-12-21 | Disposition: A | Discharge status: Home / Self Care | Payer: Medicare Other | Source: Ambulatory Visit | Attending: Internal Medicine | Admitting: Internal Medicine

## 2015-12-21 ENCOUNTER — Telehealth: Payer: Self-pay | Admitting: *Deleted

## 2015-12-21 DIAGNOSIS — Z955 Presence of coronary angioplasty implant and graft: Secondary | ICD-10-CM | POA: Diagnosis not present

## 2015-12-21 LAB — GLUCOSE, CAPILLARY
Glucose-Capillary: 189 mg/dL — ABNORMAL HIGH (ref 65–99)
Glucose-Capillary: 96 mg/dL (ref 65–99)

## 2015-12-21 MED ORDER — FUROSEMIDE 20 MG PO TABS: 20.0000 mg | ORAL_TABLET | Freq: Two times a day (BID) | ORAL | Status: DC

## 2015-12-21 NOTE — Progress Notes (Signed)
Pt arrived at cardiac rehab reporting she [facility] and states that [facility] advised her to take lasix 40mg  x2 days weekly, then 20mg  other days.  Pt is concerned because she previously took lasix 20mg  BID and was not advised by her physician to change directions.  pc to Dr. Harrington Challenger office to confirm dosage and directions.  Per Rodena Piety, Dr. Harrington Challenger triage nurse, most recent documentation states take lasix 20mg  BID, which reconciles with medication list and most recent order per Dr. Harrington Challenger.   [facility]  [facility]ist, they do not have order on file for Lasix 20mg  BID.  Request was made for Rodena Piety, Dr. Alan Ripper office to forward refill.

## 2015-12-21 NOTE — Telephone Encounter (Signed)
Received call from Andi Hence from cardiac rehab stating [facility] did not receive the correct Rx for Lasix (Lasix 20 mg BID) that was sent on 12/19/15.  [facility] [address] and also called them to see if they received Rx which they did.

## 2015-12-22 ENCOUNTER — Other Ambulatory Visit: Payer: Medicare Other

## 2015-12-23 ENCOUNTER — Encounter (HOSPITAL_COMMUNITY)
Admission: RE | Admit: 2015-12-23 | Discharge: 2015-12-23 | Disposition: A | Discharge status: Home / Self Care | Payer: Medicare Other | Source: Ambulatory Visit | Attending: Internal Medicine | Admitting: Internal Medicine

## 2015-12-23 DIAGNOSIS — Z955 Presence of coronary angioplasty implant and graft: Secondary | ICD-10-CM | POA: Diagnosis not present

## 2015-12-23 LAB — GLUCOSE, CAPILLARY
GLUCOSE-CAPILLARY: 125 mg/dL — AB (ref 65–99)
Glucose-Capillary: 187 mg/dL — ABNORMAL HIGH (ref 65–99)

## 2015-12-26 ENCOUNTER — Encounter (HOSPITAL_COMMUNITY)
Admission: RE | Admit: 2015-12-26 | Discharge: 2015-12-26 | Disposition: A | Discharge status: Home / Self Care | Payer: Medicare Other | Source: Ambulatory Visit | Attending: Internal Medicine | Admitting: Internal Medicine

## 2015-12-26 ENCOUNTER — Other Ambulatory Visit: Payer: Self-pay | Admitting: Internal Medicine

## 2015-12-26 DIAGNOSIS — Z955 Presence of coronary angioplasty implant and graft: Secondary | ICD-10-CM | POA: Diagnosis not present

## 2015-12-26 LAB — GLUCOSE, CAPILLARY
GLUCOSE-CAPILLARY: 131 mg/dL — AB (ref 65–99)
Glucose-Capillary: 91 mg/dL (ref 65–99)

## 2015-12-27 ENCOUNTER — Other Ambulatory Visit: Payer: Self-pay

## 2015-12-27 DIAGNOSIS — Z1231 Encounter for screening mammogram for malignant neoplasm of breast: Secondary | ICD-10-CM

## 2015-12-28 ENCOUNTER — Encounter (HOSPITAL_COMMUNITY)
Admission: RE | Admit: 2015-12-28 | Discharge: 2015-12-28 | Disposition: A | Discharge status: Home / Self Care | Payer: Medicare Other | Source: Ambulatory Visit | Attending: Internal Medicine | Admitting: Internal Medicine

## 2015-12-28 DIAGNOSIS — Z955 Presence of coronary angioplasty implant and graft: Secondary | ICD-10-CM | POA: Diagnosis not present

## 2015-12-28 LAB — GLUCOSE, CAPILLARY
GLUCOSE-CAPILLARY: 143 mg/dL — AB (ref 65–99)
Glucose-Capillary: 176 mg/dL — ABNORMAL HIGH (ref 65–99)

## 2015-12-30 ENCOUNTER — Encounter (HOSPITAL_COMMUNITY)
Admission: RE | Admit: 2015-12-30 | Discharge: 2015-12-30 | Disposition: A | Discharge status: Home / Self Care | Payer: Medicare Other | Source: Ambulatory Visit | Attending: Internal Medicine | Admitting: Internal Medicine

## 2015-12-30 DIAGNOSIS — Z955 Presence of coronary angioplasty implant and graft: Secondary | ICD-10-CM | POA: Diagnosis not present

## 2015-12-30 LAB — GLUCOSE, CAPILLARY
GLUCOSE-CAPILLARY: 179 mg/dL — AB (ref 65–99)
GLUCOSE-CAPILLARY: 133 mg/dL — AB (ref 65–99)

## 2016-01-02 ENCOUNTER — Encounter (HOSPITAL_COMMUNITY): Payer: Medicare Other

## 2016-01-04 ENCOUNTER — Encounter (HOSPITAL_COMMUNITY)
Admission: RE | Admit: 2016-01-04 | Discharge: 2016-01-04 | Disposition: A | Discharge status: Home / Self Care | Payer: Medicare Other | Source: Ambulatory Visit | Attending: Internal Medicine | Admitting: Internal Medicine

## 2016-01-04 DIAGNOSIS — Z955 Presence of coronary angioplasty implant and graft: Secondary | ICD-10-CM | POA: Diagnosis not present

## 2016-01-04 LAB — GLUCOSE, CAPILLARY
Glucose-Capillary: 134 mg/dL — ABNORMAL HIGH (ref 65–99)
Glucose-Capillary: 87 mg/dL (ref 65–99)

## 2016-01-05 ENCOUNTER — Ambulatory Visit
Admission: RE | Admit: 2016-01-05 | Discharge: 2016-01-05 | Disposition: A | Discharge status: Home / Self Care | Payer: Medicare Other | Source: Ambulatory Visit

## 2016-01-05 DIAGNOSIS — Z1231 Encounter for screening mammogram for malignant neoplasm of breast: Secondary | ICD-10-CM | POA: Diagnosis not present

## 2016-01-06 ENCOUNTER — Ambulatory Visit (INDEPENDENT_AMBULATORY_CARE_PROVIDER_SITE_OTHER): Payer: Medicare Other | Admitting: Internal Medicine

## 2016-01-06 ENCOUNTER — Encounter (HOSPITAL_COMMUNITY)
Admission: RE | Admit: 2016-01-06 | Discharge: 2016-01-06 | Disposition: A | Discharge status: Home / Self Care | Payer: Medicare Other | Source: Ambulatory Visit | Attending: Internal Medicine | Admitting: Internal Medicine

## 2016-01-06 ENCOUNTER — Other Ambulatory Visit (INDEPENDENT_AMBULATORY_CARE_PROVIDER_SITE_OTHER): Payer: Medicare Other | Admitting: *Deleted

## 2016-01-06 ENCOUNTER — Encounter: Payer: Self-pay | Admitting: Internal Medicine

## 2016-01-06 ENCOUNTER — Ambulatory Visit: Payer: Medicare Other | Admitting: Internal Medicine

## 2016-01-06 VITALS — BP 110/60 | HR 72 | Ht 66.0 in | Wt 302.0 lb

## 2016-01-06 DIAGNOSIS — E785 Hyperlipidemia, unspecified: Secondary | ICD-10-CM | POA: Diagnosis not present

## 2016-01-06 DIAGNOSIS — I208 Other forms of angina pectoris: Secondary | ICD-10-CM

## 2016-01-06 DIAGNOSIS — I209 Angina pectoris, unspecified: Secondary | ICD-10-CM | POA: Diagnosis not present

## 2016-01-06 DIAGNOSIS — K2100 Gastro-esophageal reflux disease with esophagitis, without bleeding: Secondary | ICD-10-CM

## 2016-01-06 DIAGNOSIS — I509 Heart failure, unspecified: Secondary | ICD-10-CM | POA: Diagnosis not present

## 2016-01-06 DIAGNOSIS — I110 Hypertensive heart disease with heart failure: Secondary | ICD-10-CM

## 2016-01-06 DIAGNOSIS — D126 Benign neoplasm of colon, unspecified: Secondary | ICD-10-CM

## 2016-01-06 DIAGNOSIS — I10 Essential (primary) hypertension: Secondary | ICD-10-CM

## 2016-01-06 DIAGNOSIS — Z955 Presence of coronary angioplasty implant and graft: Secondary | ICD-10-CM | POA: Diagnosis not present

## 2016-01-06 LAB — CBC WITH DIFFERENTIAL/PLATELET
BASOS ABS: 0 10*3/uL (ref 0.0–0.1)
BASOS PCT: 0 % (ref 0–1)
EOS ABS: 0.2 10*3/uL (ref 0.0–0.7)
Eosinophils Relative: 4 % (ref 0–5)
HCT: 32.1 % — ABNORMAL LOW (ref 36.0–46.0)
Hemoglobin: 9.9 g/dL — ABNORMAL LOW (ref 12.0–15.0)
LYMPHS ABS: 1.9 10*3/uL (ref 0.7–4.0)
Lymphocytes Relative: 36 % (ref 12–46)
MCH: 22 pg — ABNORMAL LOW (ref 26.0–34.0)
MCHC: 30.8 g/dL (ref 30.0–36.0)
MCV: 71.2 fL — AB (ref 78.0–100.0)
MPV: 10 fL (ref 8.6–12.4)
Monocytes Absolute: 0.4 10*3/uL (ref 0.1–1.0)
Monocytes Relative: 7 % (ref 3–12)
NEUTROS PCT: 53 % (ref 43–77)
Neutro Abs: 2.8 10*3/uL (ref 1.7–7.7)
PLATELETS: 195 10*3/uL (ref 150–400)
RBC: 4.51 MIL/uL (ref 3.87–5.11)
RDW: 16.6 % — ABNORMAL HIGH (ref 11.5–15.5)
WBC: 5.3 10*3/uL (ref 4.0–10.5)

## 2016-01-06 LAB — LIPID PANEL
CHOL/HDL RATIO: 3 ratio (ref <=5.0)
CHOLESTEROL: 125 mg/dL (ref 125–200)
HDL: 42 mg/dL — AB (ref >=46)
LDL CALC: 75 mg/dL (ref <130)
TRIGLYCERIDES: 38 mg/dL (ref <150)
VLDL: 8 mg/dL (ref <30)

## 2016-01-06 LAB — BASIC METABOLIC PANEL
BUN: 25 mg/dL (ref 7–25)
CHLORIDE: 102 mmol/L (ref 98–110)
CO2: 27 mmol/L (ref 20–31)
Calcium: 8.7 mg/dL (ref 8.6–10.4)
Creat: 1.58 mg/dL — ABNORMAL HIGH (ref 0.50–0.99)
Glucose, Bld: 114 mg/dL — ABNORMAL HIGH (ref 65–99)
POTASSIUM: 4.1 mmol/L (ref 3.5–5.3)
SODIUM: 140 mmol/L (ref 135–146)

## 2016-01-06 LAB — HEPATIC FUNCTION PANEL
ALBUMIN: 3.5 g/dL — AB (ref 3.6–5.1)
ALT: 13 U/L (ref 6–29)
AST: 21 U/L (ref 10–35)
Alkaline Phosphatase: 87 U/L (ref 33–130)
Bilirubin, Direct: 0.1 mg/dL (ref <=0.2)
Indirect Bilirubin: 0.4 mg/dL (ref 0.2–1.2)
TOTAL PROTEIN: 6.3 g/dL (ref 6.1–8.1)
Total Bilirubin: 0.5 mg/dL (ref 0.2–1.2)

## 2016-01-06 LAB — GLUCOSE, CAPILLARY
Glucose-Capillary: 102 mg/dL — ABNORMAL HIGH (ref 65–99)
Glucose-Capillary: 116 mg/dL — ABNORMAL HIGH (ref 65–99)

## 2016-01-06 NOTE — Patient Instructions (Signed)
Your physician recommends that you continue on your current medications as directed. Please refer to the Current Medication list given to you today.  Your physician wants you to follow-up in: August, 2017 with Dr. Harrington Challenger.  You will receive a reminder letter in the mail two months in advance. If you don't receive a letter, please call our office to schedule the follow-up appointment.  Please return to lab to have CBC and BMET collected today.
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
## 2016-01-07 ENCOUNTER — Other Ambulatory Visit: Payer: Self-pay | Admitting: Internal Medicine

## 2016-01-09 ENCOUNTER — Encounter (HOSPITAL_COMMUNITY): Payer: Medicare Other

## 2016-01-11 ENCOUNTER — Encounter (HOSPITAL_COMMUNITY)
Admission: RE | Admit: 2016-01-11 | Discharge: 2016-01-11 | Disposition: A | Discharge status: Home / Self Care | Payer: Medicare Other | Source: Ambulatory Visit | Attending: Internal Medicine | Admitting: Internal Medicine

## 2016-01-11 DIAGNOSIS — Z955 Presence of coronary angioplasty implant and graft: Secondary | ICD-10-CM | POA: Diagnosis not present

## 2016-01-11 LAB — GLUCOSE, CAPILLARY: GLUCOSE-CAPILLARY: 216 mg/dL — AB (ref 65–99)

## 2016-01-12 ENCOUNTER — Ambulatory Visit: Payer: Medicare Other | Admitting: Pharmacist

## 2016-01-13 ENCOUNTER — Encounter (HOSPITAL_COMMUNITY): Payer: Medicare Other

## 2016-01-16 ENCOUNTER — Encounter (HOSPITAL_COMMUNITY)
Admission: RE | Admit: 2016-01-16 | Discharge: 2016-01-16 | Disposition: A | Discharge status: Home / Self Care | Payer: Medicare Other | Source: Ambulatory Visit | Attending: Internal Medicine | Admitting: Internal Medicine

## 2016-01-16 DIAGNOSIS — Z955 Presence of coronary angioplasty implant and graft: Secondary | ICD-10-CM | POA: Diagnosis not present

## 2016-01-16 LAB — GLUCOSE, CAPILLARY: GLUCOSE-CAPILLARY: 226 mg/dL — AB (ref 65–99)

## 2016-01-18 ENCOUNTER — Encounter (HOSPITAL_COMMUNITY): Payer: Medicare Other

## 2016-01-20 ENCOUNTER — Encounter (HOSPITAL_COMMUNITY)
Admission: RE | Admit: 2016-01-20 | Discharge: 2016-01-20 | Disposition: A | Discharge status: Home / Self Care | Payer: Medicare Other | Source: Ambulatory Visit | Attending: Internal Medicine | Admitting: Internal Medicine

## 2016-01-20 DIAGNOSIS — Z955 Presence of coronary angioplasty implant and graft: Secondary | ICD-10-CM | POA: Insufficient documentation

## 2016-01-20 LAB — GLUCOSE, CAPILLARY
GLUCOSE-CAPILLARY: 139 mg/dL — AB (ref 65–99)
GLUCOSE-CAPILLARY: 102 mg/dL — AB (ref 65–99)

## 2016-01-20 NOTE — Progress Notes (Signed)
60 day psychosocial:  No psychosocial needs identified, no intervention necessary. Pt is exercising on his own at home using a sit and spin. Will continue to monitor.

## 2016-01-23 ENCOUNTER — Encounter (HOSPITAL_COMMUNITY): Payer: Medicare Other

## 2016-01-25 ENCOUNTER — Encounter (HOSPITAL_COMMUNITY)
Admission: RE | Admit: 2016-01-25 | Discharge: 2016-01-25 | Disposition: A | Discharge status: Home / Self Care | Payer: Medicare Other | Source: Ambulatory Visit | Attending: Internal Medicine | Admitting: Internal Medicine

## 2016-01-25 DIAGNOSIS — Z955 Presence of coronary angioplasty implant and graft: Secondary | ICD-10-CM | POA: Diagnosis not present

## 2016-01-25 LAB — GLUCOSE, CAPILLARY: Glucose-Capillary: 162 mg/dL — ABNORMAL HIGH (ref 65–99)

## 2016-01-27 ENCOUNTER — Encounter (HOSPITAL_COMMUNITY)
Admission: RE | Admit: 2016-01-27 | Discharge: 2016-01-27 | Disposition: A | Discharge status: Home / Self Care | Payer: Medicare Other | Source: Ambulatory Visit | Attending: Internal Medicine | Admitting: Internal Medicine

## 2016-01-27 DIAGNOSIS — Z955 Presence of coronary angioplasty implant and graft: Secondary | ICD-10-CM | POA: Diagnosis not present

## 2016-01-27 LAB — GLUCOSE, CAPILLARY
GLUCOSE-CAPILLARY: 179 mg/dL — AB (ref 65–99)
GLUCOSE-CAPILLARY: 191 mg/dL — AB (ref 65–99)

## 2016-01-30 ENCOUNTER — Encounter (HOSPITAL_COMMUNITY)
Admission: RE | Admit: 2016-01-30 | Discharge: 2016-01-30 | Disposition: A | Discharge status: Home / Self Care | Payer: Medicare Other | Source: Ambulatory Visit | Attending: Internal Medicine | Admitting: Internal Medicine

## 2016-01-30 DIAGNOSIS — Z955 Presence of coronary angioplasty implant and graft: Secondary | ICD-10-CM | POA: Diagnosis not present

## 2016-01-30 LAB — GLUCOSE, CAPILLARY
Glucose-Capillary: 138 mg/dL — ABNORMAL HIGH (ref 65–99)
Glucose-Capillary: 126 mg/dL — ABNORMAL HIGH (ref 65–99)

## 2016-02-01 ENCOUNTER — Encounter (HOSPITAL_COMMUNITY)
Admission: RE | Admit: 2016-02-01 | Discharge: 2016-02-01 | Disposition: A | Discharge status: Home / Self Care | Payer: Medicare Other | Source: Ambulatory Visit | Attending: Internal Medicine | Admitting: Internal Medicine

## 2016-02-01 DIAGNOSIS — Z955 Presence of coronary angioplasty implant and graft: Secondary | ICD-10-CM | POA: Diagnosis not present

## 2016-02-01 LAB — GLUCOSE, CAPILLARY: GLUCOSE-CAPILLARY: 199 mg/dL — AB (ref 65–99)

## 2016-02-03 ENCOUNTER — Other Ambulatory Visit: Payer: Self-pay | Admitting: Internal Medicine

## 2016-02-03 ENCOUNTER — Encounter (HOSPITAL_COMMUNITY): Payer: Medicare Other

## 2016-02-06 ENCOUNTER — Other Ambulatory Visit: Payer: Self-pay | Admitting: *Deleted

## 2016-02-06 ENCOUNTER — Encounter (HOSPITAL_COMMUNITY)
Admission: RE | Admit: 2016-02-06 | Discharge: 2016-02-06 | Disposition: A | Discharge status: Home / Self Care | Payer: Medicare Other | Source: Ambulatory Visit | Attending: Internal Medicine | Admitting: Internal Medicine

## 2016-02-06 DIAGNOSIS — Z955 Presence of coronary angioplasty implant and graft: Secondary | ICD-10-CM | POA: Diagnosis not present

## 2016-02-06 LAB — GLUCOSE, CAPILLARY: GLUCOSE-CAPILLARY: 203 mg/dL — AB (ref 65–99)

## 2016-02-06 MED ORDER — FUROSEMIDE 20 MG PO TABS: 20.0000 mg | ORAL_TABLET | Freq: Two times a day (BID) | ORAL | Status: DC
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
## 2016-02-08 ENCOUNTER — Encounter (HOSPITAL_COMMUNITY)
Admission: RE | Admit: 2016-02-08 | Discharge: 2016-02-08 | Disposition: A | Discharge status: Home / Self Care | Payer: Medicare Other | Source: Ambulatory Visit | Attending: Internal Medicine | Admitting: Internal Medicine

## 2016-02-08 DIAGNOSIS — Z955 Presence of coronary angioplasty implant and graft: Secondary | ICD-10-CM | POA: Diagnosis not present

## 2016-02-08 LAB — GLUCOSE, CAPILLARY
GLUCOSE-CAPILLARY: 113 mg/dL — AB (ref 65–99)
Glucose-Capillary: 248 mg/dL — ABNORMAL HIGH (ref 65–99)

## 2016-02-10 ENCOUNTER — Other Ambulatory Visit: Payer: Self-pay | Admitting: Internal Medicine

## 2016-02-10 ENCOUNTER — Encounter (HOSPITAL_COMMUNITY)
Admission: RE | Admit: 2016-02-10 | Discharge: 2016-02-10 | Disposition: A | Discharge status: Home / Self Care | Payer: Medicare Other | Source: Ambulatory Visit | Attending: Internal Medicine | Admitting: Internal Medicine

## 2016-02-10 DIAGNOSIS — Z955 Presence of coronary angioplasty implant and graft: Secondary | ICD-10-CM | POA: Diagnosis not present

## 2016-02-10 LAB — GLUCOSE, CAPILLARY
GLUCOSE-CAPILLARY: 186 mg/dL — AB (ref 65–99)
Glucose-Capillary: 105 mg/dL — ABNORMAL HIGH (ref 65–99)

## 2016-02-10 NOTE — Telephone Encounter (Signed)
Ok to refill 

## 2016-02-13 ENCOUNTER — Encounter (HOSPITAL_COMMUNITY)
Admission: RE | Admit: 2016-02-13 | Discharge: 2016-02-13 | Disposition: A | Discharge status: Home / Self Care | Payer: Medicare Other | Source: Ambulatory Visit | Attending: Internal Medicine | Admitting: Internal Medicine

## 2016-02-13 DIAGNOSIS — Z955 Presence of coronary angioplasty implant and graft: Secondary | ICD-10-CM | POA: Diagnosis not present

## 2016-02-14 LAB — GLUCOSE, CAPILLARY: GLUCOSE-CAPILLARY: 161 mg/dL — AB (ref 65–99)

## 2016-02-15 ENCOUNTER — Other Ambulatory Visit: Payer: Self-pay | Admitting: *Deleted

## 2016-02-15 ENCOUNTER — Encounter (HOSPITAL_COMMUNITY)
Admission: RE | Admit: 2016-02-15 | Discharge: 2016-02-15 | Disposition: A | Discharge status: Home / Self Care | Payer: Medicare Other | Source: Ambulatory Visit | Attending: Internal Medicine | Admitting: Internal Medicine

## 2016-02-15 DIAGNOSIS — Z955 Presence of coronary angioplasty implant and graft: Secondary | ICD-10-CM | POA: Diagnosis not present

## 2016-02-15 LAB — GLUCOSE, CAPILLARY
GLUCOSE-CAPILLARY: 181 mg/dL — AB (ref 65–99)
Glucose-Capillary: 240 mg/dL — ABNORMAL HIGH (ref 65–99)

## 2016-02-15 MED ORDER — CARVEDILOL 25 MG PO TABS: 25.0000 mg | ORAL_TABLET | Freq: Two times a day (BID) | ORAL | Status: DC

## 2016-02-15 NOTE — Progress Notes (Signed)
PSYCHOSOCIAL REVIEW  No psychosocial needs identified, no intervention necessary. Pt is exercising on his own at home, using sit and spin. 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 Will continue to monitor.

## 2016-02-15 NOTE — Telephone Encounter (Signed)
Pt called back again to rqst refill for ferrous sulfate. Pt sts that she has taken her last pills this morning

## 2016-02-16 NOTE — Telephone Encounter (Signed)
Patient requesting a refill on ferrous sulfate. Ok to refill for the patient?

## 2016-02-17 ENCOUNTER — Encounter (HOSPITAL_COMMUNITY): Payer: Medicare Other

## 2016-02-20 ENCOUNTER — Encounter (HOSPITAL_COMMUNITY)
Admission: RE | Admit: 2016-02-20 | Discharge: 2016-02-20 | Disposition: A | Discharge status: Home / Self Care | Payer: Medicare Other | Source: Ambulatory Visit | Attending: Internal Medicine | Admitting: Internal Medicine

## 2016-02-20 DIAGNOSIS — Z955 Presence of coronary angioplasty implant and graft: Secondary | ICD-10-CM | POA: Insufficient documentation

## 2016-02-22 ENCOUNTER — Telehealth: Payer: Self-pay | Admitting: Internal Medicine

## 2016-02-22 ENCOUNTER — Encounter (HOSPITAL_COMMUNITY)
Admission: RE | Admit: 2016-02-22 | Discharge: 2016-02-22 | Disposition: A | Discharge status: Home / Self Care | Payer: Medicare Other | Source: Ambulatory Visit | Attending: Internal Medicine | Admitting: Internal Medicine

## 2016-02-22 DIAGNOSIS — R5383 Other fatigue: Secondary | ICD-10-CM

## 2016-02-22 DIAGNOSIS — Z955 Presence of coronary angioplasty implant and graft: Secondary | ICD-10-CM | POA: Diagnosis not present

## 2016-02-22 DIAGNOSIS — R601 Generalized edema: Secondary | ICD-10-CM

## 2016-02-22 DIAGNOSIS — R609 Edema, unspecified: Secondary | ICD-10-CM

## 2016-02-22 LAB — GLUCOSE, CAPILLARY
GLUCOSE-CAPILLARY: 167 mg/dL — AB (ref 65–99)
Glucose-Capillary: 142 mg/dL — ABNORMAL HIGH (ref 65–99)
Glucose-Capillary: 114 mg/dL — ABNORMAL HIGH (ref 65–99)

## 2016-02-22 NOTE — Progress Notes (Addendum)
At cardiac rehab today, pt reports she has had fatigue and leg heaviness for several days. Pt states she has had decreased urination although taking usual lasix dosage. Pt fatigued more than usual with exercise. Pt reports she has had knee pain and taking aleve daily for 1 week. PC to Dr. Harrington Challenger office, nurse unavailable, left message for her to call pt to further assess.   Pt cardiac rehab weight stable (138.7kg), although pt reports her home weight is up, lungs clear. Pt instructed to avoid aleve and await further instruction from Dr. Harrington Challenger' nurse. Pt also instructed to notify office if symptoms unimproved or worsen.  Pt verbalized understanding.

## 2016-02-22 NOTE — Telephone Encounter (Signed)
New message   She is calling to update on pt conditions when she was in the office earlier today  She said you can call her or the pt

## 2016-02-22 NOTE — Telephone Encounter (Signed)
Have pt come in for BMET, BNP and TSH

## 2016-02-22 NOTE — Telephone Encounter (Signed)
Please see cardiac rehab progress note from today.  Called patient. She reports that her legs feel like she is carrying around 60 pound weights on them.  She reports they are swollen from her feet to thighs. Cardiac rehab reports her weight has been stable. She denies SOB.  Reports fatigue.  Confirmed that she takes Lasix 20 mg twice daily. She states she is urinating two times per day and the amount has decreased.  Pt is aware I will forward to Dr. Harrington Challenger for review/recommendations and will call her back tomorrow.

## 2016-02-23 DIAGNOSIS — R600 Localized edema: Secondary | ICD-10-CM | POA: Insufficient documentation

## 2016-02-23 DIAGNOSIS — R609 Edema, unspecified: Secondary | ICD-10-CM | POA: Insufficient documentation

## 2016-02-23 NOTE — Telephone Encounter (Signed)
Patient will come for labs tomorrow after cardiac rehab. She is feeling okay today, about the same as yesterday.

## 2016-02-24 ENCOUNTER — Other Ambulatory Visit (INDEPENDENT_AMBULATORY_CARE_PROVIDER_SITE_OTHER): Payer: Medicare Other | Admitting: *Deleted

## 2016-02-24 ENCOUNTER — Encounter (HOSPITAL_COMMUNITY)
Admission: RE | Admit: 2016-02-24 | Discharge: 2016-02-24 | Disposition: A | Discharge status: Home / Self Care | Payer: Medicare Other | Source: Ambulatory Visit | Attending: Internal Medicine | Admitting: Internal Medicine

## 2016-02-24 ENCOUNTER — Other Ambulatory Visit: Payer: Self-pay | Admitting: Internal Medicine

## 2016-02-24 DIAGNOSIS — Z955 Presence of coronary angioplasty implant and graft: Secondary | ICD-10-CM | POA: Diagnosis not present

## 2016-02-24 DIAGNOSIS — R609 Edema, unspecified: Secondary | ICD-10-CM | POA: Diagnosis not present

## 2016-02-24 DIAGNOSIS — R601 Generalized edema: Secondary | ICD-10-CM

## 2016-02-24 DIAGNOSIS — R5383 Other fatigue: Secondary | ICD-10-CM

## 2016-02-24 LAB — BASIC METABOLIC PANEL
BUN: 18 mg/dL (ref 7–25)
CALCIUM: 8.9 mg/dL (ref 8.6–10.4)
CHLORIDE: 104 mmol/L (ref 98–110)
CO2: 28 mmol/L (ref 20–31)
CREATININE: 1.71 mg/dL — AB (ref 0.50–0.99)
Glucose, Bld: 140 mg/dL — ABNORMAL HIGH (ref 65–99)
Potassium: 4.1 mmol/L (ref 3.5–5.3)
Sodium: 141 mmol/L (ref 135–146)

## 2016-02-24 LAB — GLUCOSE, CAPILLARY
GLUCOSE-CAPILLARY: 154 mg/dL — AB (ref 65–99)
Glucose-Capillary: 93 mg/dL (ref 65–99)
Glucose-Capillary: 128 mg/dL — ABNORMAL HIGH (ref 65–99)

## 2016-02-24 LAB — BRAIN NATRIURETIC PEPTIDE: BRAIN NATRIURETIC PEPTIDE: 71.9 pg/mL (ref <100)

## 2016-02-24 LAB — TSH: TSH: 1.49 m[IU]/L

## 2016-02-24 NOTE — Addendum Note (Signed)
Addended by: Eulis Foster on: 02/24/2016 12:09 PM   Modules accepted: Orders

## 2016-02-27 ENCOUNTER — Encounter (HOSPITAL_COMMUNITY): Payer: Medicare Other

## 2016-02-27 ENCOUNTER — Telehealth: Payer: Self-pay | Admitting: *Deleted

## 2016-02-27 DIAGNOSIS — Z9861 Coronary angioplasty status: Principal | ICD-10-CM

## 2016-02-27 DIAGNOSIS — R7989 Other specified abnormal findings of blood chemistry: Secondary | ICD-10-CM | POA: Insufficient documentation

## 2016-02-27 DIAGNOSIS — I119 Hypertensive heart disease without heart failure: Secondary | ICD-10-CM

## 2016-02-27 DIAGNOSIS — E785 Hyperlipidemia, unspecified: Secondary | ICD-10-CM

## 2016-02-27 DIAGNOSIS — R601 Generalized edema: Secondary | ICD-10-CM

## 2016-02-27 DIAGNOSIS — I2510 Atherosclerotic heart disease of native coronary artery without angina pectoris: Secondary | ICD-10-CM

## 2016-02-27 MED ORDER — FUROSEMIDE 20 MG PO TABS: 20.0000 mg | ORAL_TABLET | Freq: Every day | ORAL | Status: DC

## 2016-02-27 NOTE — Telephone Encounter (Signed)
-----   Message from [name] V, MD sent at 02/24/2016  4:59 PM EDT ----- Thyroid function level is good I would recomm cutting back on lasix to 1x per day   (Twice per day may be a little too much)  BMET and BNP in 3 wks

## 2016-02-27 NOTE — Telephone Encounter (Signed)
Notified the pt that per Dr Harrington Challenger, her thyroid function was good, but creatinine level slightly elevated and she recommends that we decrease her lasix to 20 mg po daily, vs 20 mg po bid, for bid may be a little too much for her.  Informed the pt that Dr Harrington Challenger would like for her to come back for repeat labs in 3 weeks, to check a bmet and BNP.  Scheduled the pts lab for 3 weeks out on 03/19/16 at our office to check a bmet and BNP.  Pt request that her lab appt scheduled for 4/24 at our office, be added to her 5/1 lab appt, to save her a commute back to our office. Added a CBC w diff from 4/24 lab appt, to the pts 5/1 lab appt, here at our office.  Cancelled the 4/24 lab appt at our office.  Pt verbalized understanding and agrees with this plan.

## 2016-02-29 ENCOUNTER — Encounter (HOSPITAL_COMMUNITY)
Admission: RE | Admit: 2016-02-29 | Discharge: 2016-02-29 | Disposition: A | Discharge status: Home / Self Care | Payer: Medicare Other | Source: Ambulatory Visit | Attending: Internal Medicine | Admitting: Internal Medicine

## 2016-02-29 DIAGNOSIS — Z955 Presence of coronary angioplasty implant and graft: Secondary | ICD-10-CM | POA: Diagnosis not present

## 2016-02-29 LAB — GLUCOSE, CAPILLARY
Glucose-Capillary: 173 mg/dL — ABNORMAL HIGH (ref 65–99)
Glucose-Capillary: 168 mg/dL — ABNORMAL HIGH (ref 65–99)

## 2016-03-02 ENCOUNTER — Encounter (HOSPITAL_COMMUNITY)
Admission: RE | Admit: 2016-03-02 | Discharge: 2016-03-02 | Disposition: A | Discharge status: Home / Self Care | Payer: Medicare Other | Source: Ambulatory Visit | Attending: Internal Medicine | Admitting: Internal Medicine

## 2016-03-02 DIAGNOSIS — Z955 Presence of coronary angioplasty implant and graft: Secondary | ICD-10-CM | POA: Diagnosis not present

## 2016-03-02 LAB — GLUCOSE, CAPILLARY: GLUCOSE-CAPILLARY: 177 mg/dL — AB (ref 65–99)

## 2016-03-05 ENCOUNTER — Encounter (HOSPITAL_COMMUNITY)
Admission: RE | Admit: 2016-03-05 | Discharge: 2016-03-05 | Disposition: A | Discharge status: Home / Self Care | Payer: Medicare Other | Source: Ambulatory Visit | Attending: Internal Medicine | Admitting: Internal Medicine

## 2016-03-05 DIAGNOSIS — Z955 Presence of coronary angioplasty implant and graft: Secondary | ICD-10-CM | POA: Diagnosis not present

## 2016-03-05 LAB — GLUCOSE, CAPILLARY
GLUCOSE-CAPILLARY: 167 mg/dL — AB (ref 65–99)
GLUCOSE-CAPILLARY: 157 mg/dL — AB (ref 65–99)

## 2016-03-07 ENCOUNTER — Encounter (HOSPITAL_COMMUNITY): Payer: Medicare Other

## 2016-03-09 ENCOUNTER — Encounter (HOSPITAL_COMMUNITY)
Admission: RE | Admit: 2016-03-09 | Discharge: 2016-03-09 | Disposition: A | Discharge status: Home / Self Care | Payer: Medicare Other | Source: Ambulatory Visit | Attending: Internal Medicine | Admitting: Internal Medicine

## 2016-03-09 DIAGNOSIS — Z955 Presence of coronary angioplasty implant and graft: Secondary | ICD-10-CM | POA: Diagnosis not present

## 2016-03-12 ENCOUNTER — Encounter (HOSPITAL_COMMUNITY)
Admission: RE | Admit: 2016-03-12 | Discharge: 2016-03-12 | Disposition: A | Discharge status: Home / Self Care | Payer: Medicare Other | Source: Ambulatory Visit | Attending: Internal Medicine | Admitting: Internal Medicine

## 2016-03-12 ENCOUNTER — Other Ambulatory Visit: Payer: Medicare Other

## 2016-03-12 DIAGNOSIS — Z955 Presence of coronary angioplasty implant and graft: Secondary | ICD-10-CM | POA: Diagnosis not present

## 2016-03-12 LAB — GLUCOSE, CAPILLARY
Glucose-Capillary: 181 mg/dL — ABNORMAL HIGH (ref 65–99)
Glucose-Capillary: 151 mg/dL — ABNORMAL HIGH (ref 65–99)

## 2016-03-14 ENCOUNTER — Encounter (HOSPITAL_COMMUNITY)
Admission: RE | Admit: 2016-03-14 | Discharge: 2016-03-14 | Disposition: A | Discharge status: Home / Self Care | Payer: Medicare Other | Source: Ambulatory Visit | Attending: Internal Medicine | Admitting: Internal Medicine

## 2016-03-14 DIAGNOSIS — Z955 Presence of coronary angioplasty implant and graft: Secondary | ICD-10-CM | POA: Diagnosis not present

## 2016-03-14 NOTE — Progress Notes (Signed)
Pt c/o right leg numbness and tingling after using recumbent bike at cardiac rehab. Pt with symmetrical upper and lower body strength, no facial droop, speech or swallowing deficits. BP:  130/70 sitting and standing. HR-85. CBG-190.  Sinus rhythm. Pt symptoms resolved quickly after getting off exercise equipment Pt given water and monitor while sitting in chair. Pt rested in chair without further complaints. Pt able to walk track and use Nustep without symptoms.  Will continue to monitor.

## 2016-03-15 LAB — GLUCOSE, CAPILLARY
GLUCOSE-CAPILLARY: 156 mg/dL — AB (ref 65–99)
Glucose-Capillary: 191 mg/dL — ABNORMAL HIGH (ref 65–99)

## 2016-03-16 ENCOUNTER — Encounter (HOSPITAL_COMMUNITY): Payer: Self-pay

## 2016-03-16 ENCOUNTER — Encounter (HOSPITAL_COMMUNITY)
Admission: RE | Admit: 2016-03-16 | Discharge: 2016-03-16 | Disposition: A | Discharge status: Home / Self Care | Payer: Medicare Other | Source: Ambulatory Visit | Attending: Internal Medicine | Admitting: Internal Medicine

## 2016-03-16 DIAGNOSIS — Z955 Presence of coronary angioplasty implant and graft: Secondary | ICD-10-CM | POA: Diagnosis not present

## 2016-03-16 LAB — GLUCOSE, CAPILLARY: GLUCOSE-CAPILLARY: 166 mg/dL — AB (ref 65–99)

## 2016-03-16 NOTE — Progress Notes (Signed)
Pt graduated from cardiac rehab program today with completion of 36 exercise sessions in Phase II. Pt maintained good attendance and progressed nicely during her participation in rehab as evidenced by increased MET level. Pt has really put forth great effort with her rehab sessions.   Medication list reconciled. Repeat  PHQ score-0.   Pt has made significant lifestyle changes and should be commended for her success. Pt feels she has achieved her goals during cardiac rehab which include increased strength and endurance. Pt plans to continue working towards her weight loss goals and is looking into the [facility] weight loss program.   Pt plans to continue exercise in cardiac maintenance program.

## 2016-03-19 ENCOUNTER — Encounter (HOSPITAL_COMMUNITY): Payer: Medicare Other

## 2016-03-19 ENCOUNTER — Other Ambulatory Visit: Payer: Medicare Other

## 2016-03-21 ENCOUNTER — Other Ambulatory Visit: Payer: Self-pay | Admitting: Internal Medicine

## 2016-03-21 ENCOUNTER — Encounter (HOSPITAL_COMMUNITY): Payer: Medicare Other

## 2016-03-23 ENCOUNTER — Encounter (HOSPITAL_COMMUNITY): Payer: Medicare Other

## 2016-04-08 ENCOUNTER — Other Ambulatory Visit: Payer: Self-pay | Admitting: Internal Medicine

## 2016-04-09 ENCOUNTER — Ambulatory Visit: Payer: Medicare Other | Admitting: Internal Medicine

## 2016-04-17 ENCOUNTER — Other Ambulatory Visit: Payer: Self-pay | Admitting: Internal Medicine

## 2016-05-01 ENCOUNTER — Other Ambulatory Visit: Payer: Self-pay | Admitting: Physician Assistant

## 2016-05-03 ENCOUNTER — Other Ambulatory Visit: Payer: Self-pay | Admitting: Internal Medicine

## 2016-05-08 ENCOUNTER — Other Ambulatory Visit: Payer: Self-pay | Admitting: Internal Medicine

## 2016-05-23 ENCOUNTER — Other Ambulatory Visit: Payer: Self-pay

## 2016-05-23 DIAGNOSIS — I10 Essential (primary) hypertension: Secondary | ICD-10-CM

## 2016-05-23 MED ORDER — SPIRONOLACTONE 25 MG PO TABS: ORAL_TABLET | ORAL | Status: DC

## 2016-05-23 MED ORDER — LOSARTAN POTASSIUM 100 MG PO TABS: ORAL_TABLET | ORAL | Status: DC

## 2016-05-23 NOTE — Telephone Encounter (Signed)
Rx sent electronically.  

## 2016-06-01 ENCOUNTER — Other Ambulatory Visit: Payer: Self-pay | Admitting: *Deleted

## 2016-06-01 MED ORDER — FERROUS SULFATE 325 (65 FE) MG PO TABS: ORAL_TABLET | ORAL | Status: DC

## 2016-06-18 ENCOUNTER — Other Ambulatory Visit: Payer: Self-pay | Admitting: Internal Medicine

## 2016-06-18 DIAGNOSIS — I10 Essential (primary) hypertension: Secondary | ICD-10-CM

## 2016-06-19 ENCOUNTER — Other Ambulatory Visit: Payer: Self-pay | Admitting: Internal Medicine

## 2016-06-28 ENCOUNTER — Other Ambulatory Visit: Payer: Self-pay | Admitting: *Deleted

## 2016-06-28 MED ORDER — ATORVASTATIN CALCIUM 80 MG PO TABS: ORAL_TABLET | ORAL | 1 refills | Status: DC

## 2016-07-20 LAB — HM DIABETES EYE EXAM

## 2016-07-27 ENCOUNTER — Encounter: Payer: Self-pay | Admitting: Internal Medicine

## 2016-08-10 ENCOUNTER — Encounter (INDEPENDENT_AMBULATORY_CARE_PROVIDER_SITE_OTHER): Payer: Self-pay

## 2016-08-10 ENCOUNTER — Ambulatory Visit (INDEPENDENT_AMBULATORY_CARE_PROVIDER_SITE_OTHER): Payer: Medicare Other | Admitting: Internal Medicine

## 2016-08-10 ENCOUNTER — Encounter: Payer: Self-pay | Admitting: Internal Medicine

## 2016-08-10 VITALS — BP 132/78 | HR 67 | Ht 66.0 in | Wt 306.0 lb

## 2016-08-10 DIAGNOSIS — Z9861 Coronary angioplasty status: Secondary | ICD-10-CM

## 2016-08-10 DIAGNOSIS — I10 Essential (primary) hypertension: Secondary | ICD-10-CM

## 2016-08-10 DIAGNOSIS — E785 Hyperlipidemia, unspecified: Secondary | ICD-10-CM

## 2016-08-10 DIAGNOSIS — R0602 Shortness of breath: Secondary | ICD-10-CM | POA: Diagnosis not present

## 2016-08-10 DIAGNOSIS — I208 Other forms of angina pectoris: Secondary | ICD-10-CM

## 2016-08-10 DIAGNOSIS — R609 Edema, unspecified: Secondary | ICD-10-CM | POA: Diagnosis not present

## 2016-08-10 DIAGNOSIS — I2510 Atherosclerotic heart disease of native coronary artery without angina pectoris: Secondary | ICD-10-CM

## 2016-08-10 LAB — CBC
HEMATOCRIT: 34.3 % — AB (ref 35.0–45.0)
HEMOGLOBIN: 10.4 g/dL — AB (ref 11.7–15.5)
MCH: 21.8 pg — ABNORMAL LOW (ref 27.0–33.0)
MCHC: 30.3 g/dL — ABNORMAL LOW (ref 32.0–36.0)
MCV: 71.9 fL — ABNORMAL LOW (ref 80.0–100.0)
MPV: 11.6 fL (ref 7.5–12.5)
Platelets: 213 10*3/uL (ref 140–400)
RBC: 4.77 MIL/uL (ref 3.80–5.10)
RDW: 15.2 % — AB (ref 11.0–15.0)
WBC: 5.2 10*3/uL (ref 3.8–10.8)

## 2016-08-10 LAB — LIPID PANEL
Cholesterol: 135 mg/dL (ref 125–200)
HDL: 43 mg/dL — ABNORMAL LOW (ref >=46)
LDL CALC: 77 mg/dL (ref <130)
TRIGLYCERIDES: 77 mg/dL (ref <150)
Total CHOL/HDL Ratio: 3.1 Ratio (ref <=5.0)
VLDL: 15 mg/dL (ref <30)

## 2016-08-10 LAB — BASIC METABOLIC PANEL
BUN: 22 mg/dL (ref 7–25)
CHLORIDE: 101 mmol/L (ref 98–110)
CO2: 27 mmol/L (ref 20–31)
Calcium: 9.2 mg/dL (ref 8.6–10.4)
Creat: 1.55 mg/dL — ABNORMAL HIGH (ref 0.50–0.99)
GLUCOSE: 416 mg/dL — AB (ref 65–99)
POTASSIUM: 4.6 mmol/L (ref 3.5–5.3)
Sodium: 137 mmol/L (ref 135–146)

## 2016-08-10 LAB — BRAIN NATRIURETIC PEPTIDE: Brain Natriuretic Peptide: 79 pg/mL (ref <100)

## 2016-08-10 LAB — TSH: TSH: 1.83 mIU/L

## 2016-08-10 NOTE — Patient Instructions (Signed)
Medication Instructions:  Your physician recommends that you continue on your current medications as directed. Please refer to the Current Medication list given to you today.   Labwork: TODAY: CBC/BMET/LIPID/TSH/BNP  Testing/Procedures: None Ordered   Follow-Up: Your physician wants you to follow-up in: 6 months with Dr. Harrington Challenger. You will receive a reminder letter in the mail two months in advance. If you don't receive a letter, please call our office to schedule the follow-up appointment.   Any Other Special Instructions Will Be Listed Below (If Applicable).     If you need a refill on your cardiac medications before your next appointment, please call your pharmacy.

## 2016-08-10 NOTE — Progress Notes (Signed)
Cardiology Office Note   Date:  08/10/2016   ID:  Alicia [name], DOB Oct 10, 1946, MRN [id number]  PCP:  Webb Silversmith, NP  Cardiologist:   [name], MD   F/U of CAD     History of Present Illness: Alicia [name] is a 70 y.o. female with a history of CAD  In December  2016 L heart cath with resultant interventiosn to RCA (DES with residural I saw the pt after   She was seen by Baldo Daub for BP   In January 2017 I aw her in Feb  Cr was up a little and I recomm pulling back in Lasix    Pt notes change in bowels with them being dark  Has been on Fe for a year  Change hadppened about 3 months ago  Trying to lose wt  Active   Calories  1400 per day   No breathing problems  Some pressure at night   In bed    Outpatient Medications Prior to Visit  Medication Sig Dispense Refill  . aspirin 81 MG tablet Take 81 mg by mouth daily.    [name] atorvastatin (LIPITOR) 80 MG tablet TAKE 1 TABLET (80 MG TOTAL) BY MOUTH EVERY EVENING. 90 tablet 1  . carvedilol (COREG) 25 MG tablet Take 1 tablet (25 mg total) by mouth 2 (two) times daily. 180 tablet 2  . clopidogrel (PLAVIX) 75 MG tablet Take 1 tablet (75 mg total) by mouth daily. 90 tablet 3  . DiphenhydrAMINE HCl, Sleep, (UNISOM SLEEPGELS PO) Take 1 tablet by mouth at bedtime as needed (SLEEP).    [name] doxazosin (CARDURA) 4 MG tablet Take 1 tablet (4 mg total) by mouth at bedtime. MUST SCHEDULE ANNUAL EXAM FOR FURTHER REFILLS 90 tablet 0  . ferrous sulfate 325 (65 FE) MG tablet TAKE 1 TABLET (325 MG TOTAL) BY MOUTH 3 (THREE) TIMES DAILY WITH MEALS. 90 tablet 3  . furosemide (LASIX) 20 MG tablet Take 1 tablet (20 mg total) by mouth daily. 90 tablet 3  . glipiZIDE (GLUCOTROL) 5 MG tablet TAKE 1 TABLET (5 MG TOTAL) BY MOUTH 2 (TWO) TIMES DAILY BEFORE A MEAL. 180 tablet 0  . Insulin Pen Needle 31G X 5 MM MISC 1 each by Does not apply route daily as needed. 100 each 6  . losartan (COZAAR) 100 MG tablet TAKE 1 TABLET (100 MG TOTAL) BY MOUTH DAILY. 90 tablet 0    . Multiple Vitamins-Minerals (MULTIVITAMIN WITH MINERALS) tablet Take 1 tablet by mouth daily.    . nitroGLYCERIN (NITROSTAT) 0.4 MG SL tablet Place 1 tablet (0.4 mg total) under the tongue every 5 (five) minutes as needed for chest pain (up to 3 doses). 25 tablet 3  . spironolactone (ALDACTONE) 25 MG tablet TAKE 1 TABLET (25 MG TOTAL) BY MOUTH DAILY. MUST HAVE OFFICE VISIT BEFORE NEXT REFILL 90 tablet 0  . traMADol (ULTRAM) 50 MG tablet Take 1-2 tablets (50-100 mg total) by mouth every 6 (six) hours as needed for pain. 45 tablet 0  . VICTOZA 18 MG/3ML SOPN INJECT 1.2 MG (0.2ML) INTO THE SKIN DAILY. MUST SCHEDULE ANNUAL EXAM 6 pen 0   No facility-administered medications prior to visit.      Allergies:   Iodinated diagnostic agents and Sulfa antibiotics   Past Medical History:  Diagnosis Date  . Anemia   . Arthritis    "right knee" (10/21/2015)  . CAD (coronary artery disease)    a. Cath 10/21/15: s/p DES to RCA:  moderate diffuse stenosis of mod diagonal, diffuse irregularity LCx and LAD. 2D echo 10/21/15: mod LVH, EF 60-65%, no RWMA, calcified MV, mod-severe LAE.  [name] Cataract   . Essential hypertension   . Family history of adverse reaction to anesthesia    "oldest sister, Alicia [name], related to brain formation at back of head; when they put her to sleep it's hard to wake her up"  . GERD (gastroesophageal reflux disease)   . History of blood transfusion    "related to ruptured tubal pregnancy"  . Hyperlipidemia   . Hypertensive heart disease   . Morbid obesity ([place])   . Type II diabetes mellitus ([place])     Past Surgical History:  Procedure Laterality Date  . ABDOMINAL HYSTERECTOMY  1988  . APPENDECTOMY  1985  . [place] SURGERY  1984  . CARDIAC CATHETERIZATION N/A 10/21/2015   Procedure: Right/Left Heart Cath and Coronary Angiography;  Surgeon: [name], MD;  Location: [facility];  Service: Cardiovascular;  Laterality: N/A;  . CHOLECYSTECTOMY OPEN  1984  . ECTOPIC  PREGNANCY SURGERY    . REDUCTION MAMMAPLASTY Bilateral ~ 1976  . TONSILLECTOMY       Social History:  The patient  reports that she has never smoked. She has never used smokeless tobacco. She reports that she does not drink alcohol or use drugs.   Family History:  The patient's family history includes Diabetes in her mother, sister, sister, and sister; Heart attack in her father and mother; Heart disease in her mother; Hyperlipidemia in her father and mother; Hypertension in her father and mother; Stomach cancer in her sister; Stroke in her father.    ROS:  Please see the history of present illness. All other systems are reviewed and  Negative to the above problem except as noted.    PHYSICAL EXAM: VS:  BP 132/78   Pulse 67   Ht 5\' 6"  (1.676 m)   Wt (!) 306 lb (138.8 kg)   SpO2 97%   BMI 49.39 kg/m   GEN: Morbidly obese 28 yod, in no acute distress HEENT: normal Neck: no JVD, carotid bruits, or masses Cardiac: RRR; no murmurs, rubs, or gallops,no edema  Respiratory:  clear to auscultation bilaterally, normal work of breathing GI: soft, nontender, nondistended, + BS  No hepatomegaly  MS: no deformity Moving all extremities   Skin: warm and dry, no rash Neuro:  Strength and sensation are intact Psych: euthymic mood, full affect   EKG:  EKG iis not  ordered today.   Lipid Panel    Component Value Date/Time   CHOL 125 01/06/2016 0956   TRIG 38 01/06/2016 0956   HDL 42 (L) 01/06/2016 0956   CHOLHDL 3.0 01/06/2016 0956   VLDL 8 01/06/2016 0956   LDLCALC 75 01/06/2016 0956      Wt Readings from Last 3 Encounters:  08/10/16 (!) 306 lb (138.8 kg)  01/06/16 (!) 302 lb (137 kg)  11/17/15 296 lb 15.4 oz (134.7 kg)      ASSESSMENT AND PLAN:  1  CAD  NO symtposm of angina  Will check labs  Plan to continue Plavix for 1 year total (December)  2  GI  Will check CBC  Change in stool color  ? Blood vs FE  3  HTN  Good control  4  HL  Check lipids  5  Wt  Pt discouraged   I encouraged her to stay acitve    Will check TSH    F/U in  6 months     Current medicines are reviewed at length with the patient today.  The patient does not have concerns regarding medicines.  Signed, [name], MD  08/10/2016 8:28 AM    [address], [place], [place]  [address] Phone: [phone]; Fax: [phone]

## 2016-08-11 ENCOUNTER — Telehealth: Payer: Self-pay | Admitting: Student

## 2016-08-11 NOTE — Telephone Encounter (Signed)
  Received call from [facility] regarding patient's abnormal glucose, elevated to 416. Attempted to contact the patient multiple times with busy signal each time.  Will notify office to follow-up with the patient on Monday. Would verify with patient that she was fasting for labs. Either way, she needs close follow-up with her PCP for better glucose control.   Signed, Erma Heritage, PA-C 08/11/2016, 12:42 PM Pager: [phone]

## 2016-08-13 ENCOUNTER — Telehealth: Payer: Self-pay | Admitting: *Deleted

## 2016-08-13 ENCOUNTER — Encounter: Payer: Self-pay | Admitting: Internal Medicine

## 2016-08-13 ENCOUNTER — Ambulatory Visit (INDEPENDENT_AMBULATORY_CARE_PROVIDER_SITE_OTHER): Payer: Medicare Other | Admitting: Internal Medicine

## 2016-08-13 VITALS — BP 134/88 | HR 76 | Temp 98.3°F | Wt 306.0 lb

## 2016-08-13 DIAGNOSIS — E1165 Type 2 diabetes mellitus with hyperglycemia: Secondary | ICD-10-CM

## 2016-08-13 DIAGNOSIS — Z23 Encounter for immunization: Secondary | ICD-10-CM

## 2016-08-13 DIAGNOSIS — I208 Other forms of angina pectoris: Secondary | ICD-10-CM | POA: Diagnosis not present

## 2016-08-13 LAB — HEMOGLOBIN A1C

## 2016-08-13 NOTE — Telephone Encounter (Signed)
Phone number [phone] busy X 2.

## 2016-08-13 NOTE — Patient Instructions (Signed)
Hyperglycemia °Hyperglycemia occurs when the glucose (sugar) in your blood is too high. Hyperglycemia can happen for many reasons, but it most often happens to people who do not know they have diabetes or are not managing their diabetes properly.  °CAUSES  °Whether you have diabetes or not, there are other causes of hyperglycemia. Hyperglycemia can occur when you have diabetes, but it can also occur in other situations that you might not be as aware of, such as: °Diabetes °· If you have diabetes and are having problems controlling your blood glucose, hyperglycemia could occur because of some of the following reasons: °¨ Not following your meal plan. °¨ Not taking your diabetes medications or not taking it properly. °¨ Exercising less or doing less activity than you normally do. °¨ Being sick. °Pre-diabetes °· This cannot be ignored. Before people develop Type 2 diabetes, they almost always have "pre-diabetes." This is when your blood glucose levels are higher than normal, but not yet high enough to be diagnosed as diabetes. Research has shown that some long-term damage to the body, especially the heart and circulatory system, may already be occurring during pre-diabetes. If you take action to manage your blood glucose when you have pre-diabetes, you may delay or prevent Type 2 diabetes from developing. °Stress °· If you have diabetes, you may be "diet" controlled or on oral medications or insulin to control your diabetes. However, you may find that your blood glucose is higher than usual in the hospital whether you have diabetes or not. This is often referred to as "stress hyperglycemia." Stress can elevate your blood glucose. This happens because of hormones put out by the body during times of stress. If stress has been the cause of your high blood glucose, it can be followed regularly by your caregiver. That way he/she can make sure your hyperglycemia does not continue to get worse or progress to  diabetes. °Steroids °· Steroids are medications that act on the infection fighting system (immune system) to block inflammation or infection. One side effect can be a rise in blood glucose. Most people can produce enough extra insulin to allow for this rise, but for those who cannot, steroids make blood glucose levels go even higher. It is not unusual for steroid treatments to "uncover" diabetes that is developing. It is not always possible to determine if the hyperglycemia will go away after the steroids are stopped. A special blood test called an A1c is sometimes done to determine if your blood glucose was elevated before the steroids were started. °SYMPTOMS °· Thirsty. °· Frequent urination. °· Dry mouth. °· Blurred vision. °· Tired or fatigue. °· Weakness. °· Sleepy. °· Tingling in feet or leg. °DIAGNOSIS  °Diagnosis is made by monitoring blood glucose in one or all of the following ways: °· A1c test. This is a chemical found in your blood. °· Fingerstick blood glucose monitoring. °· Laboratory results. °TREATMENT  °First, knowing the cause of the hyperglycemia is important before the hyperglycemia can be treated. Treatment may include, but is not be limited to: °· Education. °· Change or adjustment in medications. °· Change or adjustment in meal plan. °· Treatment for an illness, infection, etc. °· More frequent blood glucose monitoring. °· Change in exercise plan. °· Decreasing or stopping steroids. °· Lifestyle changes. °HOME CARE INSTRUCTIONS  °· Test your blood glucose as directed. °· Exercise regularly. Your caregiver will give you instructions about exercise. Pre-diabetes or diabetes which comes on with stress is helped by exercising. °· Eat wholesome,   balanced meals. Eat often and at regular, fixed times. Your caregiver or nutritionist will give you a meal plan to guide your sugar intake. °· Being at an ideal weight is important. If needed, losing as little as 10 to 15 pounds may help improve blood  glucose levels. °SEEK MEDICAL CARE IF:  °· You have questions about medicine, activity, or diet. °· You continue to have symptoms (problems such as increased thirst, urination, or weight gain). °SEEK IMMEDIATE MEDICAL CARE IF:  °· You are vomiting or have diarrhea. °· Your breath smells fruity. °· You are breathing faster or slower. °· You are very sleepy or incoherent. °· You have numbness, tingling, or pain in your feet or hands. °· You have chest pain. °· Your symptoms get worse even though you have been following your caregiver's orders. °· If you have any other questions or concerns. °  °This information is not intended to replace advice given to you by your health care provider. Make sure you discuss any questions you have with your health care provider. °  °Document Released: 05/01/2001 Document Revised: 01/28/2012 Document Reviewed: 07/12/2015 °Elsevier Interactive Patient Education ©2016 Else[employer] Inc. ° °

## 2016-08-13 NOTE — Assessment & Plan Note (Signed)
A1C today No microalbumin secondary to ARB therapy Encouraged her to consume a low fat, low carb diet and exercise to lose weight Encouraged yearly eye exams Foot exam today Flu shot given today Pneumonia vaccines UTD If A1C < 7, will refill Victoza only If A1C> 7, need to restart Glipizide only

## 2016-08-13 NOTE — Addendum Note (Signed)
Addended by: Lurlean Nanny on: 08/13/2016 11:53 AM   Modules accepted: Orders

## 2016-08-13 NOTE — Telephone Encounter (Signed)
Pt states she does not check her glucose at home so she does not have other glucose readings.  Pt states she has had some lightheadedness over the weekend. Pt states Golden Hurter, NP manages her diabetes.  Pt states she will call and schedule an appt with Rollene Fare today to follow up on glucose.  I emphasized need for pt to have follow up today for elevated glucose.

## 2016-08-13 NOTE — Telephone Encounter (Signed)
-----   Message from Erma Heritage, [place] sent at 08/11/2016  1:34 PM EDT ----- Regarding: Follow-Up on Monday Hi,   I received a call over the weekend regarding this patient's elevated glucose. I attempted to contact her multiple times and no one would answer. Could you please try her again and ask for her to follow-up with her PCP regarding her elevated blood sugar.  Thanks so much!   Signed, Erma Heritage, PA-C 08/11/2016, 1:35 PM

## 2016-08-13 NOTE — Progress Notes (Signed)
Subjective:    Patient ID: Alicia [name], female    DOB: 1946-07-02, 70 y.o.   MRN: [id number]  HPI  Pt presents to the clinic today to follow up diabetes. Her last A1C was 6.0%, 09/2015. She reports she had labs drawn by Dr. Harrington Challenger, and her glucose was 416. She is prescribed Glipizide 5 mg BID and Victoza. She reports she has been out of her Victoza for 2 weeks and she felt like her BG was dropping too low on Glipizide, so she does not take it. Her LDL is 77 on Lipitor. She does reports increased thirst, urinary frequency, and tingling in her feet. Her last eye exam was 1 week ago. She would like her flu vaccine today. Pneumonia vaccines are UTD.   Review of Systems      Past Medical History:  Diagnosis Date  . Anemia   . Arthritis    "right knee" (10/21/2015)  . CAD (coronary artery disease)    a. Cath 10/21/15: s/p DES to RCA: moderate diffuse stenosis of mod diagonal, diffuse irregularity LCx and LAD. 2D echo 10/21/15: mod LVH, EF 60-65%, no RWMA, calcified MV, mod-severe LAE.  [name] Cataract   . Essential hypertension   . Family history of adverse reaction to anesthesia    "oldest sister, [name], related to brain formation at back of head; when they put her to sleep it's hard to wake her up"  . GERD (gastroesophageal reflux disease)   . History of blood transfusion    "related to ruptured tubal pregnancy"  . Hyperlipidemia   . Hypertensive heart disease   . Morbid obesity ([place])   . Type II diabetes mellitus ([place])     Current Outpatient Prescriptions  Medication Sig Dispense Refill  . aspirin 81 MG tablet Take 81 mg by mouth daily.    [name] atorvastatin (LIPITOR) 80 MG tablet TAKE 1 TABLET (80 MG TOTAL) BY MOUTH EVERY EVENING. 90 tablet 1  . carvedilol (COREG) 25 MG tablet Take 1 tablet (25 mg total) by mouth 2 (two) times daily. 180 tablet 2  . clopidogrel (PLAVIX) 75 MG tablet Take 1 tablet (75 mg total) by mouth daily. 90 tablet 3  . DiphenhydrAMINE HCl, Sleep, (UNISOM SLEEPGELS  PO) Take 1 tablet by mouth at bedtime as needed (SLEEP).    [name] doxazosin (CARDURA) 4 MG tablet Take 1 tablet (4 mg total) by mouth at bedtime. MUST SCHEDULE ANNUAL EXAM FOR FURTHER REFILLS 90 tablet 0  . ferrous sulfate 325 (65 FE) MG tablet TAKE 1 TABLET (325 MG TOTAL) BY MOUTH 3 (THREE) TIMES DAILY WITH MEALS. 90 tablet 3  . furosemide (LASIX) 20 MG tablet Take 1 tablet (20 mg total) by mouth daily. 90 tablet 3  . glipiZIDE (GLUCOTROL) 5 MG tablet TAKE 1 TABLET (5 MG TOTAL) BY MOUTH 2 (TWO) TIMES DAILY BEFORE A MEAL. 180 tablet 0  . Insulin Pen Needle 31G X 5 MM MISC 1 each by Does not apply route daily as needed. 100 each 6  . losartan (COZAAR) 100 MG tablet TAKE 1 TABLET (100 MG TOTAL) BY MOUTH DAILY. 90 tablet 0  . Multiple Vitamins-Minerals (MULTIVITAMIN WITH MINERALS) tablet Take 1 tablet by mouth daily.    . nitroGLYCERIN (NITROSTAT) 0.4 MG SL tablet Place 1 tablet (0.4 mg total) under the tongue every 5 (five) minutes as needed for chest pain (up to 3 doses). 25 tablet 3  . spironolactone (ALDACTONE) 25 MG tablet TAKE 1 TABLET (25 MG TOTAL) BY MOUTH  DAILY. MUST HAVE OFFICE VISIT BEFORE NEXT REFILL 90 tablet 0  . traMADol (ULTRAM) 50 MG tablet Take 1-2 tablets (50-100 mg total) by mouth every 6 (six) hours as needed for pain. 45 tablet 0  . VICTOZA 18 MG/3ML SOPN INJECT 1.2 MG (0.2ML) INTO THE SKIN DAILY. MUST SCHEDULE ANNUAL EXAM 6 pen 0   No current facility-administered medications for this visit.     Allergies  Allergen Reactions  . Iodinated Diagnostic Agents Hives  . Sulfa Antibiotics Hives    Family History  Problem Relation Age of Onset  . Diabetes Mother   . Heart attack Mother   . Hypertension Mother   . Hyperlipidemia Mother   . Heart disease Mother   . Heart attack Father   . Stroke Father   . Hypertension Father   . Hyperlipidemia Father   . Stomach cancer Sister   . Diabetes Sister   . Diabetes Sister   . Diabetes Sister     Social History   Social  History  . Marital status: Divorced    Spouse name: N/A  . Number of children: 3  . Years of education: doctorate   Occupational History  . Teacher    Social History Main Topics  . Smoking status: Never Smoker  . Smokeless tobacco: Never Used  . Alcohol use No  . Drug use: No  . Sexual activity: Not Currently   Other Topics Concern  . Not on file   Social History Narrative   Caffeine use-yes   Regular exercise-no     Constitutional: Pt reports weight gain. Denies fever, malaise, fatigue, headache.  HEENT: Denies eye pain, eye redness, ear pain, ringing in the ears, wax buildup, runny nose, nasal congestion, bloody nose, or sore throat. Respiratory: Denies difficulty breathing, shortness of breath, cough or sputum production.   Cardiovascular: Denies chest pain, chest tightness, palpitations or swelling in the hands or feet.  Gastrointestinal: Pt reports increased thirst. Denies abdominal pain, bloating, constipation, diarrhea or blood in the stool.  GU: Pt reports urinary frequency. Denies urgency, pain with urination, burning sensation, blood in urine, odor or discharge. Neurological: Pt reports tingling in feet. Denies dizziness, difficulty with memory, difficulty with speech or problems with balance and coordination.    No other specific complaints in a complete review of systems (except as listed in HPI above).  Objective:   Physical Exam  BP 134/88   Pulse 76   Temp 98.3 F (36.8 C) (Oral)   Wt (!) 306 lb (138.8 kg)   SpO2 98%   BMI 49.39 kg/m  Wt Readings from Last 3 Encounters:  08/13/16 (!) 306 lb (138.8 kg)  08/10/16 (!) 306 lb (138.8 kg)  01/06/16 (!) 302 lb (137 kg)    General: Appears her stated age, obese in NAD. Skin: Warm, dry and intact. No rashes, lesions or ulcerations noted. Cardiovascular: Normal rate and rhythm. S1,S2 noted.  No murmur, rubs or gallops noted.  Pulmonary/Chest: Normal effort and positive vesicular breath sounds. No  respiratory distress. No wheezes, rales or ronchi noted.  Neurological: Alert and oriented. Sensation intact to BLE. Psychiatric: Mood and affect normal. Behavior is normal. Judgment and thought content normal.    BMET    Component Value Date/Time   NA 137 08/10/2016 0850   K 4.6 08/10/2016 0850   CL 101 08/10/2016 0850   CO2 27 08/10/2016 0850   GLUCOSE 416 (H) 08/10/2016 0850   BUN 22 08/10/2016 0850   CREATININE 1.55 (H) 08/10/2016  0850   CALCIUM 9.2 08/10/2016 0850   GFRNONAA 51 (L) 10/22/2015 0501   GFRAA 59 (L) 10/22/2015 0501    Lipid Panel     Component Value Date/Time   CHOL 135 08/10/2016 0850   TRIG 77 08/10/2016 0850   HDL 43 (L) 08/10/2016 0850   CHOLHDL 3.1 08/10/2016 0850   VLDL 15 08/10/2016 0850   LDLCALC 77 08/10/2016 0850    CBC    Component Value Date/Time   WBC 5.2 08/10/2016 0850   RBC 4.77 08/10/2016 0850   HGB 10.4 (L) 08/10/2016 0850   HCT 34.3 (L) 08/10/2016 0850   PLT 213 08/10/2016 0850   MCV 71.9 (L) 08/10/2016 0850   MCH 21.8 (L) 08/10/2016 0850   MCHC 30.3 (L) 08/10/2016 0850   RDW 15.2 (H) 08/10/2016 0850   LYMPHSABS 1.9 01/06/2016 1040   MONOABS 0.4 01/06/2016 1040   EOSABS 0.2 01/06/2016 1040   BASOSABS 0.0 01/06/2016 1040    Hgb A1C Lab Results  Component Value Date   HGBA1C 6.0 10/10/2015       Assessment & Plan:

## 2016-08-15 ENCOUNTER — Telehealth: Payer: Self-pay | Admitting: Internal Medicine

## 2016-08-15 ENCOUNTER — Other Ambulatory Visit: Payer: Self-pay | Admitting: *Deleted

## 2016-08-15 DIAGNOSIS — D649 Anemia, unspecified: Secondary | ICD-10-CM

## 2016-08-15 NOTE — Telephone Encounter (Signed)
Pt returned call re: lab results.  Best number [phone]

## 2016-08-18 ENCOUNTER — Other Ambulatory Visit: Payer: Self-pay | Admitting: Internal Medicine

## 2016-08-18 DIAGNOSIS — I10 Essential (primary) hypertension: Secondary | ICD-10-CM

## 2016-08-21 MED ORDER — GLIPIZIDE 10 MG PO TABS: 10.0000 mg | ORAL_TABLET | Freq: Two times a day (BID) | ORAL | 0 refills | Status: DC

## 2016-08-21 NOTE — Addendum Note (Signed)
Addended by: Lurlean Nanny on: 08/21/2016 04:51 PM   Modules accepted: Orders

## 2016-09-12 ENCOUNTER — Other Ambulatory Visit: Payer: Medicare Other

## 2016-09-16 ENCOUNTER — Other Ambulatory Visit: Payer: Self-pay | Admitting: Internal Medicine

## 2016-09-16 DIAGNOSIS — I10 Essential (primary) hypertension: Secondary | ICD-10-CM

## 2016-10-15 IMAGING — MG MM SCREEN MAMMOGRAM BILATERAL
6 series · 6 of 6 positions shown · non-contrast
Comparison: Previous exam(s).

CLINICAL DATA: Screening.

EXAM:
DIGITAL SCREENING BILATERAL MAMMOGRAM WITH CAD

[R CC]
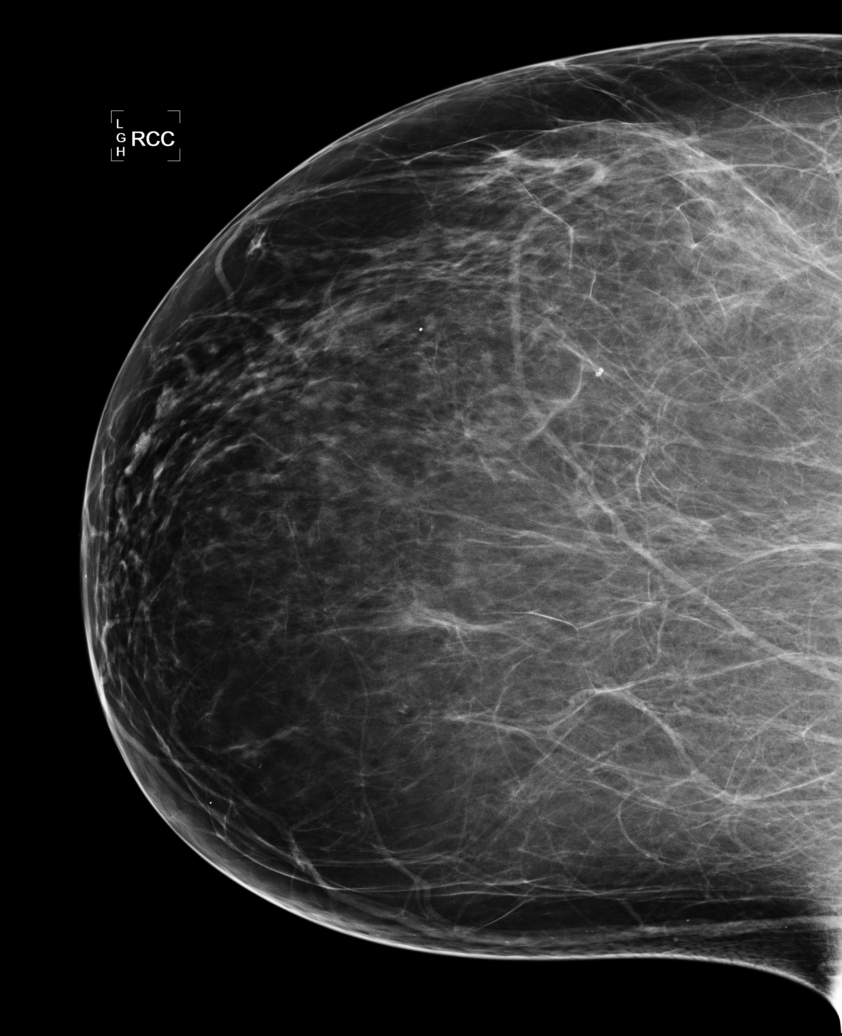

[L CC]
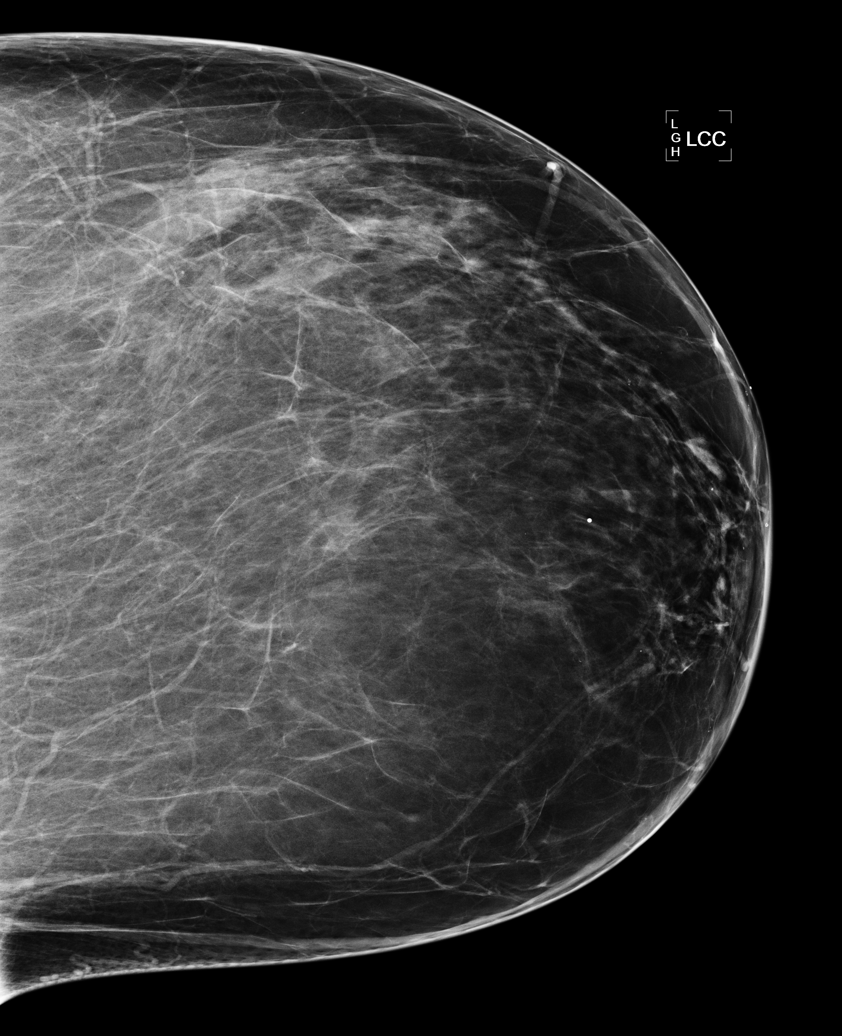

[L MLO (1 of 2)]
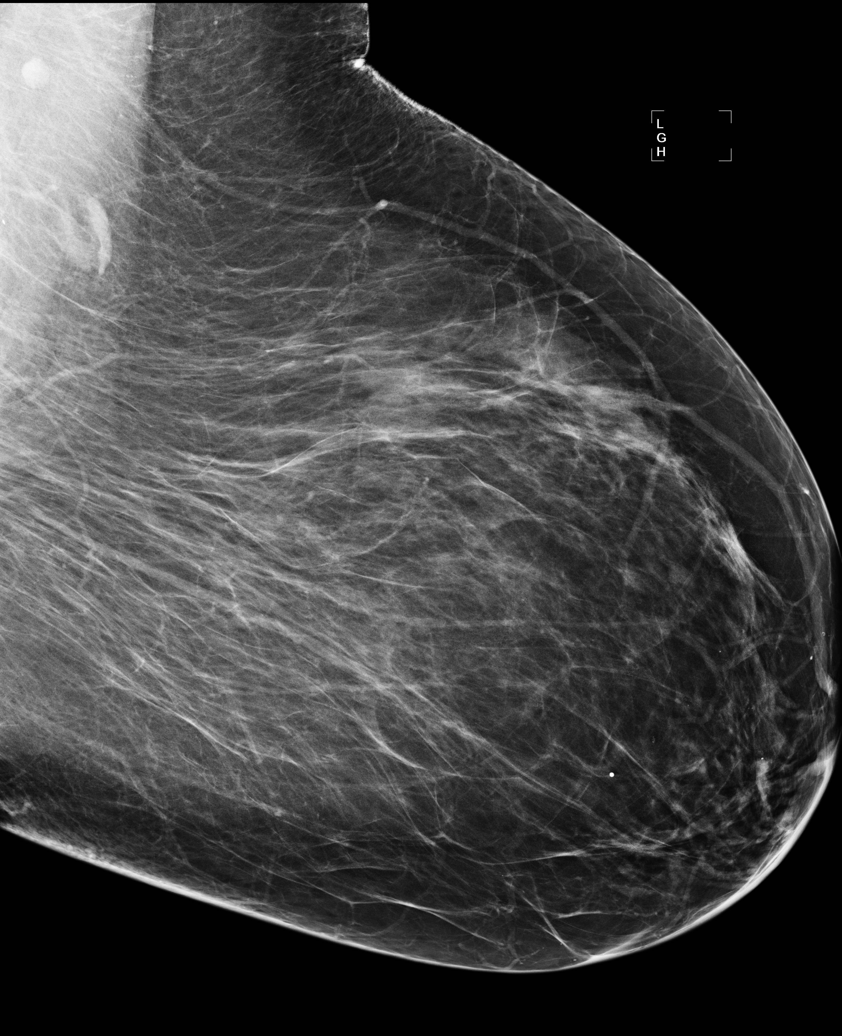

[R MLO (1 of 2)]
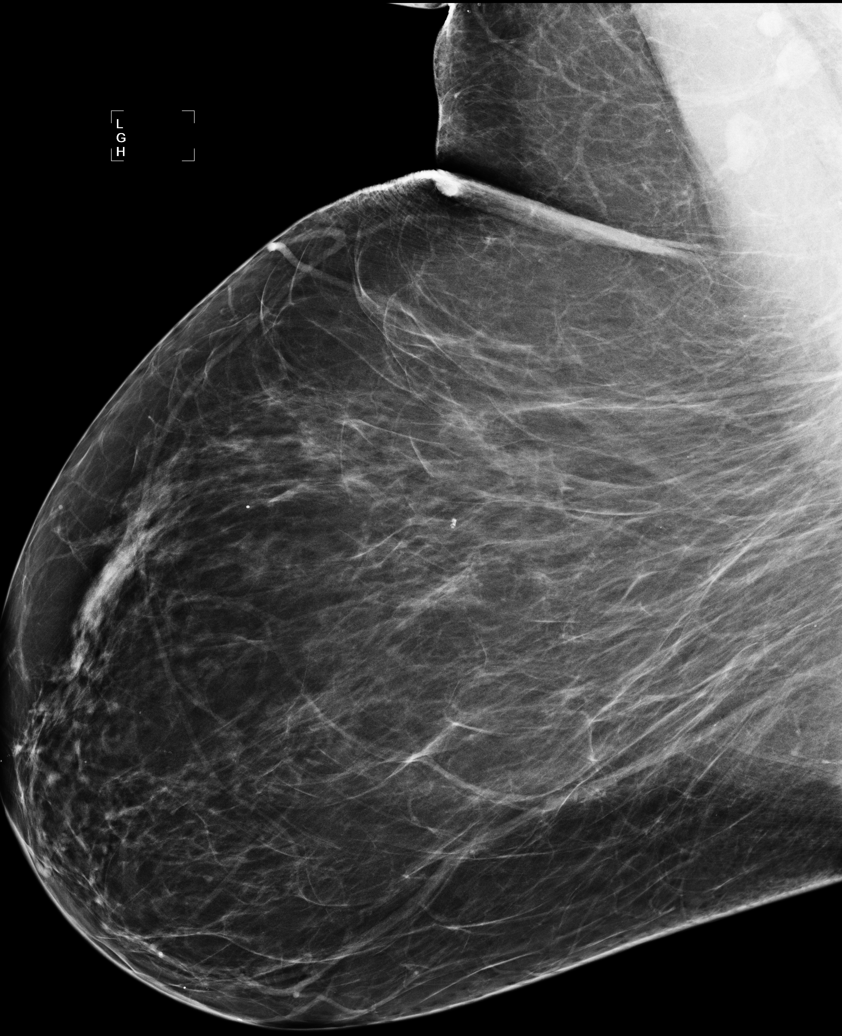

[L MLO (2 of 2)]
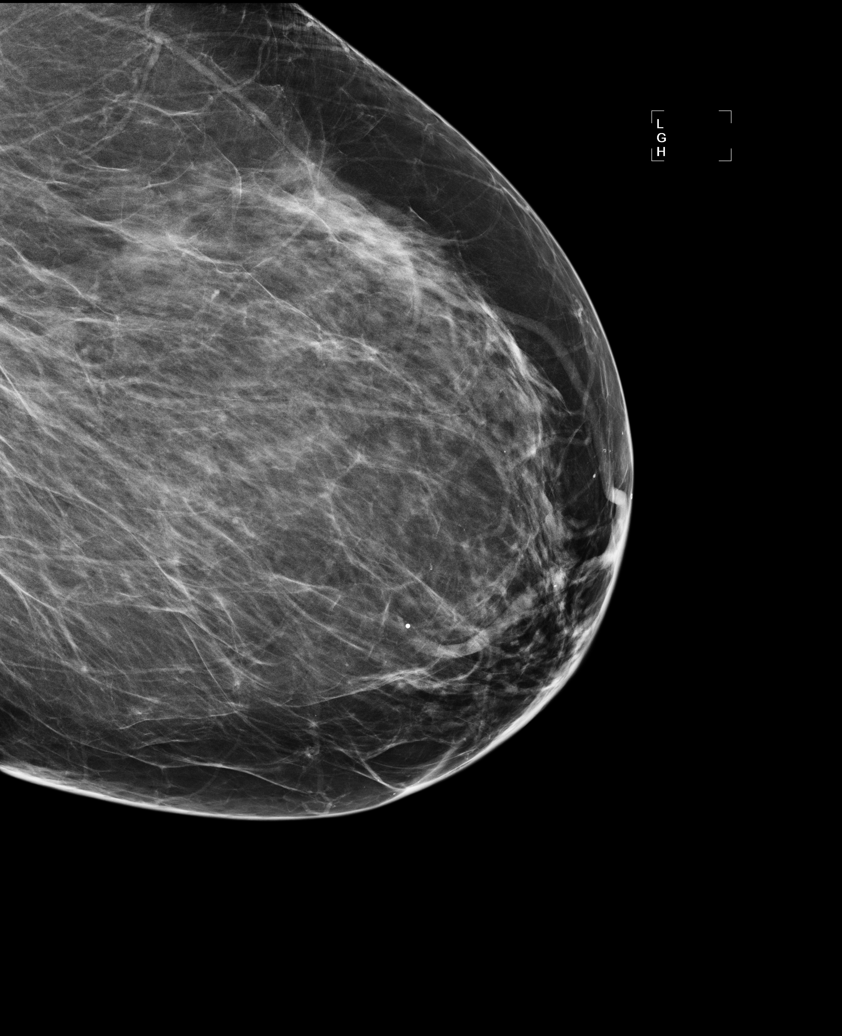

[R MLO (2 of 2)]
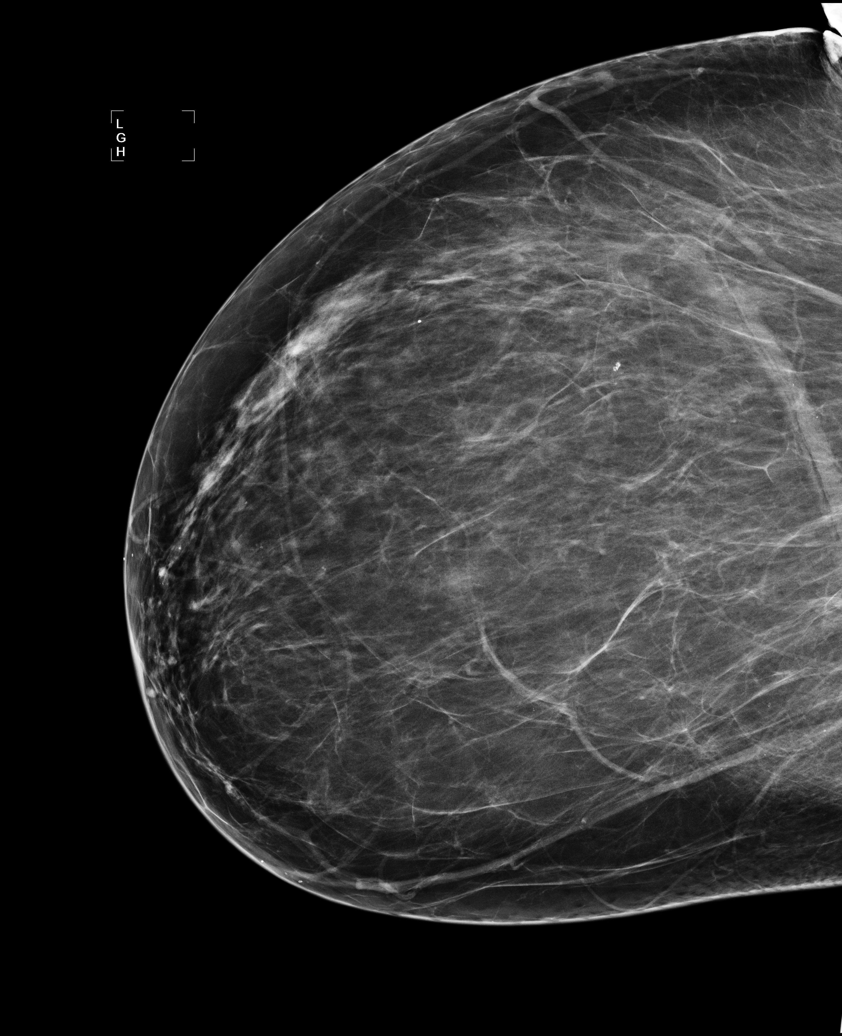

[6 of 6 positions shown; findings below may reference images not displayed]

ACR Breast Density Category b: There are scattered areas of
fibroglandular density.
FINDINGS: There are no findings suspicious for malignancy. Images were
processed with CAD.
IMPRESSION: No mammographic evidence of malignancy. A result letter of this
screening mammogram will be mailed directly to the patient.

RECOMMENDATION:
Screening mammogram in one year. (Code:AS-G-LCT)

BI-RADS CATEGORY  1: Negative.

## 2016-10-16 ENCOUNTER — Encounter: Payer: Self-pay | Admitting: Internal Medicine

## 2016-10-16 ENCOUNTER — Ambulatory Visit (INDEPENDENT_AMBULATORY_CARE_PROVIDER_SITE_OTHER): Payer: Medicare Other | Admitting: Internal Medicine

## 2016-10-16 VITALS — BP 130/74 | HR 79 | Temp 98.9°F | Ht 65.0 in | Wt 304.8 lb

## 2016-10-16 DIAGNOSIS — E1165 Type 2 diabetes mellitus with hyperglycemia: Secondary | ICD-10-CM | POA: Diagnosis not present

## 2016-10-16 DIAGNOSIS — D509 Iron deficiency anemia, unspecified: Secondary | ICD-10-CM

## 2016-10-16 DIAGNOSIS — Z9861 Coronary angioplasty status: Secondary | ICD-10-CM

## 2016-10-16 DIAGNOSIS — I208 Other forms of angina pectoris: Secondary | ICD-10-CM

## 2016-10-16 DIAGNOSIS — E7800 Pure hypercholesterolemia, unspecified: Secondary | ICD-10-CM

## 2016-10-16 DIAGNOSIS — I10 Essential (primary) hypertension: Secondary | ICD-10-CM

## 2016-10-16 DIAGNOSIS — Z0000 Encounter for general adult medical examination without abnormal findings: Secondary | ICD-10-CM

## 2016-10-16 DIAGNOSIS — K2100 Gastro-esophageal reflux disease with esophagitis, without bleeding: Secondary | ICD-10-CM

## 2016-10-16 DIAGNOSIS — I2510 Atherosclerotic heart disease of native coronary artery without angina pectoris: Secondary | ICD-10-CM

## 2016-10-16 NOTE — Progress Notes (Signed)
HPI:  Pt presents to the clinic today for her Medicare Wellness Exam. She is also due for follow up of chronic conditions.   HTN: Her BP today is 130/74. She is taking Carbedilol, Doxazosin, Lasix, Losartan and Aldactone as prescribed. She follows with Dr. Harrington Challenger. ECG and echo from 10/2015 reviewed.  HLD with CAD: Her last LDL was 77. 07/2016. She is taking Lipitor and Plavix as prescribed. She denies recent chest pain. She has Nitro but has not had to use it. She follows with Dr. Harrington Challenger.  GERD: She controls this with diet. She does not take anything OTC for this.   DM 2: Her last A1C was 14.1%. She is taking Glipizide 10 mg BID. She does not check her sugars regularly. Her last eye exam was 06/2016. Flu shot 07/2016. Pneumovax and Prevnar are UTD.  Obesity: She has gained 5 lbs over the last year. Her BMI is 50.71. She reports she recently started a new diet plan, but doesn't know the name of it. She is not able to exercise secondary to her knee pain.  Anemia: Her last H/H was 10.4/34.3. She has not noticed any s/s of bleeding. She does not take any iron supplement.  Arthritis: Mainly in her knees. She takes Aleve as needed.  Past Medical History:  Diagnosis Date  . Anemia   . Arthritis    "right knee" (10/21/2015)  . CAD (coronary artery disease)    a. Cath 10/21/15: s/p DES to RCA: moderate diffuse stenosis of mod diagonal, diffuse irregularity LCx and LAD. 2D echo 10/21/15: mod LVH, EF 60-65%, no RWMA, calcified MV, mod-severe LAE.  [name] Cataract   . Essential hypertension   . Family history of adverse reaction to anesthesia    "oldest sister, [name], related to brain formation at back of head; when they put her to sleep it's hard to wake her up"  . GERD (gastroesophageal reflux disease)   . History of blood transfusion    "related to ruptured tubal pregnancy"  . Hyperlipidemia   . Hypertensive heart disease   . Morbid obesity ([place])   . Type II diabetes mellitus ([place])     Current  Outpatient Prescriptions  Medication Sig Dispense Refill  . aspirin 81 MG tablet Take 81 mg by mouth daily.    [name] atorvastatin (LIPITOR) 80 MG tablet TAKE 1 TABLET (80 MG TOTAL) BY MOUTH EVERY EVENING. 90 tablet 1  . carvedilol (COREG) 25 MG tablet Take 1 tablet (25 mg total) by mouth 2 (two) times daily. 180 tablet 2  . clopidogrel (PLAVIX) 75 MG tablet Take 1 tablet (75 mg total) by mouth daily. 90 tablet 3  . DiphenhydrAMINE HCl, Sleep, (UNISOM SLEEPGELS PO) Take 1 tablet by mouth at bedtime as needed (SLEEP).    [name] doxazosin (CARDURA) 4 MG tablet Take 1 tablet (4 mg total) by mouth at bedtime. 90 tablet 0  . ferrous sulfate 325 (65 FE) MG tablet TAKE 1 TABLET (325 MG TOTAL) BY MOUTH 3 (THREE) TIMES DAILY WITH MEALS. 90 tablet 3  . furosemide (LASIX) 20 MG tablet Take 1 tablet (20 mg total) by mouth daily. 90 tablet 3  . glipiZIDE (GLUCOTROL) 10 MG tablet Take 1 tablet (10 mg total) by mouth 2 (two) times daily before a meal. 180 tablet 0  . Insulin Pen Needle 31G X 5 MM MISC 1 each by Does not apply route daily as needed. 100 each 6  . losartan (COZAAR) 100 MG tablet TAKE 1 TABLET (100 MG TOTAL)  BY MOUTH DAILY. 90 tablet 0  . Multiple Vitamins-Minerals (MULTIVITAMIN WITH MINERALS) tablet Take 1 tablet by mouth daily.    . nitroGLYCERIN (NITROSTAT) 0.4 MG SL tablet Place 1 tablet (0.4 mg total) under the tongue every 5 (five) minutes as needed for chest pain (up to 3 doses). 25 tablet 3  . spironolactone (ALDACTONE) 25 MG tablet Take 1 tablet (25 mg total) by mouth daily. TAKE 1 TABLET (25 MG TOTAL) BY MOUTH DAILY. 90 tablet 0  . traMADol (ULTRAM) 50 MG tablet Take 1-2 tablets (50-100 mg total) by mouth every 6 (six) hours as needed for pain. 45 tablet 0   No current facility-administered medications for this visit.     Allergies  Allergen Reactions  . Iodinated Diagnostic Agents Hives  . Sulfa Antibiotics Hives    Family History  Problem Relation Age of Onset  . Diabetes Mother   .  Heart attack Mother   . Hypertension Mother   . Hyperlipidemia Mother   . Heart disease Mother   . Heart attack Father   . Stroke Father   . Hypertension Father   . Hyperlipidemia Father   . Stomach cancer Sister   . Diabetes Sister   . Diabetes Sister   . Diabetes Sister     Social History   Social History  . Marital status: Divorced    Spouse name: N/A  . Number of children: 3  . Years of education: doctorate   Occupational History  . Teacher    Social History Main Topics  . Smoking status: Never Smoker  . Smokeless tobacco: Never Used  . Alcohol use No  . Drug use: No  . Sexual activity: Not Currently   Other Topics Concern  . Not on file   Social History Narrative   Caffeine use-yes   Regular exercise-no    Hospitiliaztions: None  Health Maintenance:    Flu: 07/2016  Tetanus: 09/2012  Pneumovax: 09/2012  Prevnar: 09/2015  Zostavax: never  Mammogram: 12/2015  Pap Smear: hysterectomy  Bone Density: never  Colon Screening: 2012  Eye Doctor: 06/2016 at [facility] Exam: as needed   Providers:   PCP: Webb Silversmith, NP-C  Cardiologist: Dr. Laurine Blazer  Gastroenterologist: Dr. Hilarie Fredrickson    I have personally reviewed and have noted:  1. The patient's medical and social history 2. Their use of alcohol, tobacco or illicit drugs 3. Their current medications and supplements 4. The patient's functional ability including ADL's, fall risks, home safety risks and hearing or visual impairment. 5. Diet and physical activities 6. Evidence for depression or mood disorder  Subjective:   Review of Systems:   Constitutional: Pt reports weight gain. Denies fever, malaise, fatigue, headache.  Respiratory: Pt reports intermittent shortness of breath with exertion. Denies difficulty breathing, cough or sputum production.   Cardiovascular: Pt reports swelling of BLE. Denies chest pain, chest tightness, palpitations or swelling in the hands.  Gastrointestinal: Denies  abdominal pain, bloating, constipation, diarrhea or blood in the stool.  GU: Denies urgency, frequency, pain with urination, burning sensation, blood in urine, odor or discharge. Musculoskeletal: Pt reports bilateral knee pain. Denies decrease in range of motion, difficulty with gait, muscle pain or joint swelling.  Skin: Denies redness, rashes, lesions or ulcercations.  Neurological: Denies dizziness, difficulty with memory, difficulty with speech or problems with balance and coordination.  Psych: Denies anxiety, depression, SI/HI.  No other specific complaints in a complete review of systems (except as listed in HPI above).  Objective:  PE:   BP 130/74   Pulse 79   Temp 98.9 F (37.2 C) (Oral)   Ht 5\' 5"  (1.651 m)   Wt (!) 304 lb 12 oz (138.2 kg)   SpO2 98%   BMI 50.71 kg/m   Wt Readings from Last 3 Encounters:  08/13/16 (!) 306 lb (138.8 kg)  08/10/16 (!) 306 lb (138.8 kg)  01/06/16 (!) 302 lb (137 kg)    General: Appears her stated age, obese in NAD. Skin: Warm, dry and intact.  Cardiovascular: Normal rate and rhythm. S1,S2 noted.  No murmur, rubs or gallops noted. Trace BLE edema. No carotid bruits noted. Pulmonary/Chest: Normal effort and positive vesicular breath sounds. No respiratory distress. No wheezes, rales or ronchi noted.  Abdomen: Soft and nontender. Normal bowel sounds. No distention or masses noted.  Musculoskeletal: No pain with palpation of the knees. No difficulty with gait.  Neurological: Alert and oriented. Sensation intact to BLE. Psychiatric: Mood and affect normal. Behavior is normal. Judgment and thought content normal.     BMET    Component Value Date/Time   NA 137 08/10/2016 0850   K 4.6 08/10/2016 0850   CL 101 08/10/2016 0850   CO2 27 08/10/2016 0850   GLUCOSE 416 (H) 08/10/2016 0850   BUN 22 08/10/2016 0850   CREATININE 1.55 (H) 08/10/2016 0850   CALCIUM 9.2 08/10/2016 0850   GFRNONAA 51 (L) 10/22/2015 0501   GFRAA 59 (L) 10/22/2015  0501    Lipid Panel     Component Value Date/Time   CHOL 135 08/10/2016 0850   TRIG 77 08/10/2016 0850   HDL 43 (L) 08/10/2016 0850   CHOLHDL 3.1 08/10/2016 0850   VLDL 15 08/10/2016 0850   LDLCALC 77 08/10/2016 0850    CBC    Component Value Date/Time   WBC 5.2 08/10/2016 0850   RBC 4.77 08/10/2016 0850   HGB 10.4 (L) 08/10/2016 0850   HCT 34.3 (L) 08/10/2016 0850   PLT 213 08/10/2016 0850   MCV 71.9 (L) 08/10/2016 0850   MCH 21.8 (L) 08/10/2016 0850   MCHC 30.3 (L) 08/10/2016 0850   RDW 15.2 (H) 08/10/2016 0850   LYMPHSABS 1.9 01/06/2016 1040   MONOABS 0.4 01/06/2016 1040   EOSABS 0.2 01/06/2016 1040   BASOSABS 0.0 01/06/2016 1040    Hgb A1C Lab Results  Component Value Date   HGBA1C 14.1 Repeated and verified X2. (H) 08/13/2016      Assessment and Plan:   Medicare Annual Wellness Visit:  Diet: She does eat meat. She consumes fruits and veggies daily. She tries to avoid fried food. She drinks mostly coffee and water. Physical activity: Sedentary Depression/mood screen: Negative Hearing: Intact to whispered voice Visual acuity: Grossly normal, performs annual eye exam  ADLs: Capable Fall risk: None Home safety: Good Cognitive evaluation: Intact to orientation, naming, recall and repetition EOL planning: No adv directives, full code/ I agree  Preventative Medicine: Flu, tetanus, pneumovax and prevnar UTD. She declines shingles vaccine. Mammogram and colonoscopy UTD. She declines pelvic exam and bone density exam. Colonoscopy UTD. Encouraged her to consume a balanced diet and exercise regimen. Advised her to see a eye doctor and dentist annually. No labs today, make lab only appt for labs to be done in 1 month.   Next appointment: 6 months, follow up chronic conditions.     Webb Silversmith, NP

## 2016-10-18 NOTE — Patient Instructions (Signed)

## 2016-10-18 NOTE — Assessment & Plan Note (Signed)
Will check lipid profile and CMET in 1 month Encouraged her to consume a low fat diet Continue Lipitor and Plavix

## 2016-10-18 NOTE — Assessment & Plan Note (Signed)
Controlled on multiple meds Will get CMET in 1 month She will continue to follow with Dr. Harrington Challenger

## 2016-10-18 NOTE — Assessment & Plan Note (Signed)
Will check A1C in 1 month No microalbumin secondary to ACEI therapy Encouraged her to consume a low carb diet and exercise to lose weight Foot exam today Continue yearly eye exams Continue Glipizide for now

## 2016-10-18 NOTE — Assessment & Plan Note (Signed)
Encouraged her to work on diet and exercise 

## 2016-10-18 NOTE — Assessment & Plan Note (Signed)
Will check CBC and iron panel in 1 month Continue to monitor for signs of bleeding

## 2016-10-18 NOTE — Assessment & Plan Note (Signed)
Encouraged her to continue to avoid triggers Discussed how weight loss could help improve her reflux

## 2016-10-18 NOTE — Assessment & Plan Note (Signed)
Will check CMET and lipid profile in 1 month Encouraged her to consume a low fat diet Continue Lipitor and Plavix for now Continue to follow with Dr. Harrington Challenger

## 2016-10-23 NOTE — Addendum Note (Signed)
Addended by: Ellamae Sia on: 10/23/2016 09:52 AM   Modules accepted: Orders

## 2016-10-31 ENCOUNTER — Other Ambulatory Visit: Payer: Self-pay | Admitting: Physician Assistant

## 2016-11-15 ENCOUNTER — Telehealth: Payer: Self-pay | Admitting: Radiology

## 2016-11-15 ENCOUNTER — Other Ambulatory Visit (INDEPENDENT_AMBULATORY_CARE_PROVIDER_SITE_OTHER): Payer: Medicare Other

## 2016-11-15 DIAGNOSIS — E1165 Type 2 diabetes mellitus with hyperglycemia: Secondary | ICD-10-CM | POA: Diagnosis not present

## 2016-11-15 DIAGNOSIS — E7800 Pure hypercholesterolemia, unspecified: Secondary | ICD-10-CM | POA: Diagnosis not present

## 2016-11-15 DIAGNOSIS — D509 Iron deficiency anemia, unspecified: Secondary | ICD-10-CM | POA: Diagnosis not present

## 2016-11-15 DIAGNOSIS — I10 Essential (primary) hypertension: Secondary | ICD-10-CM

## 2016-11-15 LAB — CBC WITH DIFFERENTIAL/PLATELET
BASOS ABS: 0 10*3/uL (ref 0.0–0.1)
Basophils Relative: 0.3 % (ref 0.0–3.0)
EOS PCT: 2 % (ref 0.0–5.0)
Eosinophils Absolute: 0.1 10*3/uL (ref 0.0–0.7)
HCT: 34.9 % — ABNORMAL LOW (ref 36.0–46.0)
HEMOGLOBIN: 11.1 g/dL — AB (ref 12.0–15.0)
LYMPHS ABS: 1.2 10*3/uL (ref 0.7–4.0)
Lymphocytes Relative: 23.9 % (ref 12.0–46.0)
MCHC: 31.8 g/dL (ref 30.0–36.0)
MCV: 70.9 fl — AB (ref 78.0–100.0)
MONO ABS: 0.4 10*3/uL (ref 0.1–1.0)
MONOS PCT: 7.6 % (ref 3.0–12.0)
NEUTROS PCT: 66.2 % (ref 43.0–77.0)
Neutro Abs: 3.3 10*3/uL (ref 1.4–7.7)
Platelets: 196 10*3/uL (ref 150.0–400.0)
RBC: 4.92 Mil/uL (ref 3.87–5.11)
RDW: 15.7 % — ABNORMAL HIGH (ref 11.5–15.5)
WBC: 5 10*3/uL (ref 4.0–10.5)

## 2016-11-15 LAB — COMPREHENSIVE METABOLIC PANEL
ALT: 17 U/L (ref 0–35)
AST: 16 U/L (ref 0–37)
Albumin: 3.7 g/dL (ref 3.5–5.2)
Alkaline Phosphatase: 127 U/L — ABNORMAL HIGH (ref 39–117)
BILIRUBIN TOTAL: 0.6 mg/dL (ref 0.2–1.2)
BUN: 24 mg/dL — ABNORMAL HIGH (ref 6–23)
CALCIUM: 9.1 mg/dL (ref 8.4–10.5)
CO2: 33 meq/L — AB (ref 19–32)
Chloride: 96 mEq/L (ref 96–112)
Creatinine, Ser: 1.78 mg/dL — ABNORMAL HIGH (ref 0.40–1.20)
GFR: 36.33 mL/min — AB (ref >60.00)
GLUCOSE: 643 mg/dL — AB (ref 70–99)
POTASSIUM: 4.8 meq/L (ref 3.5–5.1)
Sodium: 135 mEq/L (ref 135–145)
Total Protein: 6.5 g/dL (ref 6.0–8.3)

## 2016-11-15 LAB — LIPID PANEL
CHOL/HDL RATIO: 4
Cholesterol: 150 mg/dL (ref 0–200)
HDL: 42.7 mg/dL (ref >39.00)
LDL CALC: 80 mg/dL (ref 0–99)
NONHDL: 106.86
TRIGLYCERIDES: 134 mg/dL (ref 0.0–149.0)
VLDL: 26.8 mg/dL (ref 0.0–40.0)

## 2016-11-15 LAB — IBC PANEL
Iron: 92 ug/dL (ref 42–145)
SATURATION RATIOS: 32.1 % (ref 20.0–50.0)
TRANSFERRIN: 205 mg/dL — AB (ref 212.0–360.0)

## 2016-11-15 NOTE — Telephone Encounter (Signed)
Pt will be notified by result note

## 2016-11-15 NOTE — Telephone Encounter (Signed)
[facility] called critical lab results, Glucose - 643. Results called to [facility].

## 2016-11-16 ENCOUNTER — Emergency Department (HOSPITAL_COMMUNITY)
Admission: EM | Admit: 2016-11-16 | Discharge: 2016-11-16 | Disposition: A | Discharge status: Home / Self Care | Payer: Medicare Other | Attending: Emergency Medicine | Admitting: Emergency Medicine

## 2016-11-16 ENCOUNTER — Encounter (HOSPITAL_COMMUNITY): Payer: Self-pay | Admitting: *Deleted

## 2016-11-16 DIAGNOSIS — Z7982 Long term (current) use of aspirin: Secondary | ICD-10-CM | POA: Diagnosis not present

## 2016-11-16 DIAGNOSIS — Z794 Long term (current) use of insulin: Secondary | ICD-10-CM | POA: Diagnosis not present

## 2016-11-16 DIAGNOSIS — E1165 Type 2 diabetes mellitus with hyperglycemia: Secondary | ICD-10-CM | POA: Diagnosis not present

## 2016-11-16 DIAGNOSIS — I2510 Atherosclerotic heart disease of native coronary artery without angina pectoris: Secondary | ICD-10-CM | POA: Insufficient documentation

## 2016-11-16 DIAGNOSIS — R739 Hyperglycemia, unspecified: Secondary | ICD-10-CM

## 2016-11-16 DIAGNOSIS — I10 Essential (primary) hypertension: Secondary | ICD-10-CM | POA: Diagnosis not present

## 2016-11-16 LAB — CBC WITH DIFFERENTIAL/PLATELET
BASOS ABS: 0 10*3/uL (ref 0.0–0.1)
Basophils Relative: 0 %
Eosinophils Absolute: 0.1 10*3/uL (ref 0.0–0.7)
Eosinophils Relative: 2 %
HEMATOCRIT: 35.8 % — AB (ref 36.0–46.0)
HEMOGLOBIN: 11.1 g/dL — AB (ref 12.0–15.0)
LYMPHS ABS: 1.5 10*3/uL (ref 0.7–4.0)
LYMPHS PCT: 26 %
MCH: 22.3 pg — ABNORMAL LOW (ref 26.0–34.0)
MCHC: 31 g/dL (ref 30.0–36.0)
MCV: 71.9 fL — AB (ref 78.0–100.0)
Monocytes Absolute: 0.3 10*3/uL (ref 0.1–1.0)
Monocytes Relative: 5 %
NEUTROS ABS: 3.9 10*3/uL (ref 1.7–7.7)
Neutrophils Relative %: 67 %
Platelets: 199 10*3/uL (ref 150–400)
RBC: 4.98 MIL/uL (ref 3.87–5.11)
RDW: 15.8 % — ABNORMAL HIGH (ref 11.5–15.5)
WBC: 5.9 10*3/uL (ref 4.0–10.5)

## 2016-11-16 LAB — BASIC METABOLIC PANEL
ANION GAP: 9 (ref 5–15)
BUN: 23 mg/dL — ABNORMAL HIGH (ref 6–20)
CHLORIDE: 102 mmol/L (ref 101–111)
CO2: 27 mmol/L (ref 22–32)
Calcium: 9.2 mg/dL (ref 8.9–10.3)
Creatinine, Ser: 1.67 mg/dL — ABNORMAL HIGH (ref 0.44–1.00)
GFR calc Af Amer: 35 mL/min — ABNORMAL LOW (ref >60)
GFR calc non Af Amer: 30 mL/min — ABNORMAL LOW (ref >60)
Glucose, Bld: 443 mg/dL — ABNORMAL HIGH (ref 65–99)
POTASSIUM: 4.4 mmol/L (ref 3.5–5.1)
SODIUM: 138 mmol/L (ref 135–145)

## 2016-11-16 LAB — HEMOGLOBIN A1C: Hgb A1c MFr Bld: 15.3 % — ABNORMAL HIGH (ref 4.6–6.5)

## 2016-11-16 LAB — CBG MONITORING, ED: Glucose-Capillary: 330 mg/dL — ABNORMAL HIGH (ref 65–99)

## 2016-11-16 MED ORDER — METFORMIN HCL 500 MG PO TABS: 500.0000 mg | ORAL_TABLET | Freq: Two times a day (BID) | ORAL | 0 refills | Status: DC

## 2016-11-16 MED ORDER — INSULIN ASPART 100 UNIT/ML ~~LOC~~ SOLN
5.0000 [IU] | Freq: Once | SUBCUTANEOUS | Status: AC
  Administered 2016-11-16: 5 [IU] via INTRAVENOUS
  Filled 2016-11-16: qty 1

## 2016-11-16 MED ORDER — SODIUM CHLORIDE 0.9 % IV BOLUS (SEPSIS)
2000.0000 mL | Freq: Once | INTRAVENOUS | Status: AC
  Administered 2016-11-16: 2000 mL via INTRAVENOUS

## 2016-11-16 NOTE — ED Notes (Signed)
Pt verbalized understanding of metformin use and to keep up with CBG at home and to follow up with pcp and has no further questions. Pt stable and NAD. VSS.

## 2016-11-16 NOTE — ED Provider Notes (Signed)
[facility] DEPT Provider Note   CSN: [id number] Arrival date & time: 11/16/16  1211     History   Chief Complaint Chief Complaint  Patient presents with  . Leg Pain    HPI   Blood pressure 107/86, pulse 63, temperature 97.9 F (36.6 C), temperature source Oral, resp. rate 16, height 5\' 5"  (1.651 m), weight (!) 137.9 kg, SpO2 94 %.  Alicia [name] is a 70 y.o. female sent from primary care for evaluation of hyperglycemia, States she has not been checking her blood sugar regularly because she's been busy cooking for the holidays. As chart review patient has A1c of 15.3 drawn yesterday. She is compliant with her glipizide, she was taken off metformin and victoza because after her cardiac rehabilitation her blood sugar was dropping into the 40s after exercise. She endorses polyuria and polydipsia with no unintentional weight loss, fever, chills, chest pain, abdominal pain. She does endorse bilateral upper extremity cramping onset 2-3 days ago she states that sometimes she has still stop walking and let the cramps past because they're so severe. She's not taking any pain medication for this.  Past Medical History:  Diagnosis Date  . Anemia   . Arthritis    "right knee" (10/21/2015)  . CAD (coronary artery disease)    a. Cath 10/21/15: s/p DES to RCA: moderate diffuse stenosis of mod diagonal, diffuse irregularity LCx and LAD. 2D echo 10/21/15: mod LVH, EF 60-65%, no RWMA, calcified MV, mod-severe LAE.  [name] Cataract   . Essential hypertension   . Family history of adverse reaction to anesthesia    "oldest sister, [name], related to brain formation at back of head; when they put her to sleep it's hard to wake her up"  . GERD (gastroesophageal reflux disease)   . History of blood transfusion    "related to ruptured tubal pregnancy"  . Hyperlipidemia   . Hypertensive heart disease   . Morbid obesity ([place])   . Type II diabetes mellitus [facility])     Patient Active Problem List   Diagnosis  Date Noted  . CAD S/P percutaneous coronary angioplasty 10/22/2015  . Essential hypertension 10/22/2015  . Microcytic anemia 10/22/2015  . Morbid obesity ([place]) 10/22/2015  . Hyperlipidemia 10/09/2012  . GERD (gastroesophageal reflux disease) 10/09/2012  . Diabetes mellitus type 2, controlled ([place]) 10/09/2012  . Adenomatous colon polyp 09/24/2012  . Gastric bypass status for obesity 10/12/2011    Past Surgical History:  Procedure Laterality Date  . ABDOMINAL HYSTERECTOMY  1988  . APPENDECTOMY  1985  . [place] SURGERY  1984  . CARDIAC CATHETERIZATION N/A 10/21/2015   Procedure: Right/Left Heart Cath and Coronary Angiography;  Surgeon: [name], MD;  Location: [facility];  Service: Cardiovascular;  Laterality: N/A;  . CHOLECYSTECTOMY OPEN  1984  . ECTOPIC PREGNANCY SURGERY    . REDUCTION MAMMAPLASTY Bilateral ~ 1976  . TONSILLECTOMY      OB History    No data available       Home Medications    Prior to Admission medications   Medication Sig Start Date End Date Taking? Authorizing Provider  aspirin 81 MG tablet Take 81 mg by mouth daily.    Historical Provider, MD  atorvastatin (LIPITOR) 80 MG tablet TAKE 1 TABLET (80 MG TOTAL) BY MOUTH EVERY EVENING. 06/28/16   Dayna N Dunn, PA-C  carvedilol (COREG) 25 MG tablet Take 1 tablet (25 mg total) by mouth 2 (two) times daily. 02/15/16   Eileen Stanford,  PA-C  clopidogrel (PLAVIX) 75 MG tablet TAKE 1 TABLET (75 MG TOTAL) BY MOUTH DAILY. 10/31/16   [name], MD  DiphenhydrAMINE HCl, Sleep, (UNISOM SLEEPGELS PO) Take 1 tablet by mouth at bedtime as needed (SLEEP).    Historical Provider, MD  doxazosin (CARDURA) 4 MG tablet Take 1 tablet (4 mg total) by mouth at bedtime. 09/17/16   Jearld Fenton, NP  ferrous sulfate 325 (65 FE) MG tablet TAKE 1 TABLET (325 MG TOTAL) BY MOUTH 3 (THREE) TIMES DAILY WITH MEALS. 06/01/16   [name], MD  furosemide (LASIX) 20 MG tablet Take 1 tablet (20 mg total) by mouth daily.  02/27/16   [name], MD  glipiZIDE (GLUCOTROL) 10 MG tablet Take 1 tablet (10 mg total) by mouth 2 (two) times daily before a meal. 08/21/16   Jearld Fenton, NP  Insulin Pen Needle 31G X 5 MM MISC 1 each by Does not apply route daily as needed. 10/12/15   Jearld Fenton, NP  losartan (COZAAR) 100 MG tablet TAKE 1 TABLET (100 MG TOTAL) BY MOUTH DAILY. 08/20/16   Jearld Fenton, NP  metFORMIN (GLUCOPHAGE) 500 MG tablet Take 1 tablet (500 mg total) by mouth 2 (two) times daily with a meal. 11/16/16   Keilynn Marano, PA-C  Multiple Vitamins-Minerals (MULTIVITAMIN WITH MINERALS) tablet Take 1 tablet by mouth daily.    Historical Provider, MD  nitroGLYCERIN (NITROSTAT) 0.4 MG SL tablet Place 1 tablet (0.4 mg total) under the tongue every 5 (five) minutes as needed for chest pain (up to 3 doses). 10/22/15   Dayna N Dunn, PA-C  spironolactone (ALDACTONE) 25 MG tablet Take 1 tablet (25 mg total) by mouth daily. TAKE 1 TABLET (25 MG TOTAL) BY MOUTH DAILY. 08/20/16   Jearld Fenton, NP    Family History Family History  Problem Relation Age of Onset  . Diabetes Mother   . Heart attack Mother   . Hypertension Mother   . Hyperlipidemia Mother   . Heart disease Mother   . Heart attack Father   . Stroke Father   . Hypertension Father   . Hyperlipidemia Father   . Stomach cancer Sister   . Diabetes Sister   . Diabetes Sister   . Diabetes Sister     Social History Social History  Substance Use Topics  . Smoking status: Never Smoker  . Smokeless tobacco: Never Used  . Alcohol use No     Allergies   Iodinated diagnostic agents and Sulfa antibiotics   Review of Systems Review of Systems  10 systems reviewed and found to be negative, except as noted in the HPI.  Physical Exam Updated Vital Signs BP 182/86   Pulse (!) 58   Temp 97.9 F (36.6 C) (Oral)   Resp 16   Ht 5\' 5"  (1.651 m)   Wt (!) 137.9 kg   SpO2 100%   BMI 50.59 kg/m   Physical Exam  Constitutional: She is oriented  to person, place, and time. She appears well-developed and well-nourished. No distress.  HENT:  Head: Normocephalic and atraumatic.  Mouth/Throat: Oropharynx is clear and moist.  Eyes: Conjunctivae and EOM are normal. Pupils are equal, round, and reactive to light.  Neck: Normal range of motion.  Cardiovascular: Normal rate, regular rhythm and intact distal pulses.   Pulmonary/Chest: Effort normal and breath sounds normal.  Abdominal: Soft. There is no tenderness.  Musculoskeletal: Normal range of motion.  Neurological: She is alert and oriented to  person, place, and time.  Skin: She is not diaphoretic.  Psychiatric: She has a normal mood and affect.  Nursing note and vitals reviewed.    ED Treatments / Results  Labs (all labs ordered are listed, but only abnormal results are displayed) Labs Reviewed  BASIC METABOLIC PANEL - Abnormal; Notable for the following:       Result Value   Glucose, Bld 443 (*)    BUN 23 (*)    Creatinine, Ser 1.67 (*)    GFR calc non Af Amer 30 (*)    GFR calc Af Amer 35 (*)    All other components within normal limits  CBC WITH DIFFERENTIAL/PLATELET - Abnormal; Notable for the following:    Hemoglobin 11.1 (*)    HCT 35.8 (*)    MCV 71.9 (*)    MCH 22.3 (*)    RDW 15.8 (*)    All other components within normal limits  CBG MONITORING, ED - Abnormal; Notable for the following:    Glucose-Capillary 330 (*)    All other components within normal limits  APTT    EKG  EKG Interpretation None       Radiology No results found.  Procedures Procedures (including critical care time)  Medications Ordered in ED Medications  sodium chloride 0.9 % bolus 2,000 mL (0 mLs Intravenous Stopped 11/16/16 1814)  insulin aspart (novoLOG) injection 5 Units (5 Units Intravenous Given 11/16/16 1622)     Initial Impression / Assessment and Plan / ED Course  I have reviewed the triage vital signs and the nursing notes.  Pertinent labs & imaging results  that were available during my care of the patient were reviewed by me and considered in my medical decision making (see chart for details).  Clinical Course     Vitals:   11/16/16 1525 11/16/16 1630 11/16/16 1700 11/16/16 1730  BP: 163/87 163/99 164/78 182/86  Pulse: (!) 57 72 67 (!) 58  Resp: 16     Temp:      TempSrc:      SpO2: 100% 100% 100% 100%  Weight:      Height:        Medications  sodium chloride 0.9 % bolus 2,000 mL (0 mLs Intravenous Stopped 11/16/16 1814)  insulin aspart (novoLOG) injection 5 Units (5 Units Intravenous Given 11/16/16 1622)    Chassie Pennix is 70 y.o. female presenting with Elevated blood sugar, our reading today is 443. She has a creatinine of 1.67, this is not atypical for her baseline. A1c which was drawn yesterday is over 15. It seems that her metformin and Victoza were DC'd over concerns for hypoglycemia. She will likely need to restart these medications. Patient is overall well appearing, she has a normal anion gap and no significant abnormalities on vital signs. Patient will be given a bolus and 5 units IV insulin.  Blood sugar improved to 330. Will restart metformin and have her follow closely with primary care.  Evaluation does not show pathology that would require ongoing emergent intervention or inpatient treatment. Pt is hemodynamically stable and mentating appropriately. Discussed findings and plan with patient/guardian, who agrees with care plan. All questions answered. Return precautions discussed and outpatient follow up given.    Final Clinical Impressions(s) / ED Diagnoses   Final diagnoses:  Hyperglycemia without ketosis    New Prescriptions New Prescriptions   METFORMIN (GLUCOPHAGE) 500 MG TABLET    Take 1 tablet (500 mg total) by mouth 2 (two) times daily  with a meal.     Monico Blitz, PA-C 11/16/[address], MD 11/22/16 1742

## 2016-11-16 NOTE — Discharge Instructions (Signed)
Please make an appointment with your primary care doctor to discuss further options for control of your diabetes.  Do not hesitate to return to the emergency department for any new, worsening or concerning symptoms.

## 2016-11-16 NOTE — ED Triage Notes (Signed)
Patient c/o right leg pain onset 3-4 days ago sates she was in the doctor office yest for a follow up visit and had labs drawn was called today and told her labs were abnormal and she need to be checked out.

## 2016-11-22 ENCOUNTER — Other Ambulatory Visit: Payer: Medicare Other

## 2016-11-28 ENCOUNTER — Other Ambulatory Visit: Payer: Self-pay | Admitting: Internal Medicine

## 2016-11-28 DIAGNOSIS — I10 Essential (primary) hypertension: Secondary | ICD-10-CM

## 2016-11-28 DIAGNOSIS — E1165 Type 2 diabetes mellitus with hyperglycemia: Secondary | ICD-10-CM

## 2016-11-28 NOTE — Telephone Encounter (Signed)
Pt was started on Metformin at ED, when did you want pt to f/u---or should there be any further changes to medication....just wanted to confirm before refilling medication

## 2016-11-29 NOTE — Telephone Encounter (Signed)
Yes, continue the others. Follow up in 3 months.

## 2016-12-06 ENCOUNTER — Other Ambulatory Visit: Payer: Self-pay | Admitting: Physician Assistant

## 2016-12-12 NOTE — Telephone Encounter (Signed)
Reminder letter mailed to pt. 

## 2016-12-13 ENCOUNTER — Other Ambulatory Visit: Payer: Self-pay | Admitting: Internal Medicine

## 2016-12-13 DIAGNOSIS — I10 Essential (primary) hypertension: Secondary | ICD-10-CM

## 2016-12-13 DIAGNOSIS — R7989 Other specified abnormal findings of blood chemistry: Secondary | ICD-10-CM

## 2016-12-13 DIAGNOSIS — I2510 Atherosclerotic heart disease of native coronary artery without angina pectoris: Secondary | ICD-10-CM

## 2016-12-13 DIAGNOSIS — I119 Hypertensive heart disease without heart failure: Secondary | ICD-10-CM

## 2016-12-13 DIAGNOSIS — R601 Generalized edema: Secondary | ICD-10-CM

## 2016-12-13 DIAGNOSIS — E785 Hyperlipidemia, unspecified: Secondary | ICD-10-CM

## 2016-12-13 DIAGNOSIS — Z9861 Coronary angioplasty status: Principal | ICD-10-CM

## 2016-12-19 ENCOUNTER — Other Ambulatory Visit: Payer: Self-pay | Admitting: Physician Assistant

## 2016-12-22 ENCOUNTER — Other Ambulatory Visit: Payer: Self-pay | Admitting: Internal Medicine

## 2017-01-10 ENCOUNTER — Other Ambulatory Visit: Payer: Self-pay | Admitting: Internal Medicine

## 2017-01-16 ENCOUNTER — Other Ambulatory Visit: Payer: Self-pay | Admitting: Internal Medicine

## 2017-01-21 ENCOUNTER — Other Ambulatory Visit: Payer: Self-pay | Admitting: Internal Medicine

## 2017-01-22 NOTE — Telephone Encounter (Signed)
Medication Detail    Disp Refills Start End   carvedilol (COREG) 25 MG tablet 60 tablet 5 01/16/2017    Sig: TAKE 1 TABLET BY MOUTH TWICE A DAY   E-Prescribing Status: Receipt confirmed by pharmacy (01/16/2017 9:40 AM EST)   Pharmacy   [id number] [address], [place]

## 2017-02-19 ENCOUNTER — Other Ambulatory Visit: Payer: Self-pay | Admitting: *Deleted

## 2017-02-19 MED ORDER — METFORMIN HCL 500 MG PO TABS: 500.0000 mg | ORAL_TABLET | Freq: Two times a day (BID) | ORAL | 0 refills | Status: DC

## 2017-02-19 NOTE — Telephone Encounter (Signed)
Pt requesting medication refill and has upcoming appt 4/10. Spoke to pt who states she only has enough meds thru Thursday 4/5. #20 sent to pharmacy as f/u is for DM, so labs can be as accurate as possible. Pt advised unable to have additional tabs until seen. Rx sent to pharmacy

## 2017-02-24 ENCOUNTER — Other Ambulatory Visit: Payer: Self-pay | Admitting: Internal Medicine

## 2017-02-24 DIAGNOSIS — I10 Essential (primary) hypertension: Secondary | ICD-10-CM

## 2017-02-24 DIAGNOSIS — E1165 Type 2 diabetes mellitus with hyperglycemia: Secondary | ICD-10-CM

## 2017-02-26 ENCOUNTER — Encounter: Payer: Self-pay | Admitting: Internal Medicine

## 2017-02-26 ENCOUNTER — Ambulatory Visit (INDEPENDENT_AMBULATORY_CARE_PROVIDER_SITE_OTHER): Payer: Medicare Other | Admitting: Internal Medicine

## 2017-02-26 ENCOUNTER — Ambulatory Visit: Payer: Medicare Other | Admitting: Internal Medicine

## 2017-02-26 VITALS — BP 154/100 | HR 70 | Temp 97.9°F | Wt 307.2 lb

## 2017-02-26 DIAGNOSIS — I10 Essential (primary) hypertension: Secondary | ICD-10-CM | POA: Diagnosis not present

## 2017-02-26 DIAGNOSIS — E1165 Type 2 diabetes mellitus with hyperglycemia: Secondary | ICD-10-CM

## 2017-02-26 LAB — LIPID PANEL
CHOLESTEROL: 168 mg/dL (ref 0–200)
HDL: 43.3 mg/dL (ref >39.00)
LDL Cholesterol: 101 mg/dL — ABNORMAL HIGH (ref 0–99)
NonHDL: 125
Total CHOL/HDL Ratio: 4
Triglycerides: 122 mg/dL (ref 0.0–149.0)
VLDL: 24.4 mg/dL (ref 0.0–40.0)

## 2017-02-26 LAB — HEMOGLOBIN A1C: HEMOGLOBIN A1C: 14.1 % — AB (ref 4.6–6.5)

## 2017-02-26 NOTE — Progress Notes (Signed)
Subjective:    Patient ID: Alicia [name], female    DOB: 1946/05/24, 71 y.o.   MRN: [id number]  HPI  Pt presents to the clinic today for 3 month follow up of DM 2. She had been noncompliant with her medication, and her last A1C went up to 15.3% 10/2016. She was restarted on her Glipizide 10 mg and Meformin 500 mg BID. She is not currently on the Victoza at this time. Her fasting blood sugars range 286-315. She tries to avoid pasta, she eats toast daily. She does not eat sweets. She drinks mostly flavored water. She is not able to exercise due to knee pain. She does c/o increased thirst, numbness and tingling in her feet. She denies urinary frequency or nonhealing wounds.  Of note, her BP today is 154/100. She reports she has been taking Carvedilol, Doxazosin, Lasix, Spironolactone and Losartan as prescribed. She denies chest pain or shortness of breath at this time.   Review of Systems      Past Medical History:  Diagnosis Date  . Anemia   . Arthritis    "right knee" (10/21/2015)  . CAD (coronary artery disease)    a. Cath 10/21/15: s/p DES to RCA: moderate diffuse stenosis of mod diagonal, diffuse irregularity LCx and LAD. 2D echo 10/21/15: mod LVH, EF 60-65%, no RWMA, calcified MV, mod-severe LAE.  [name] Cataract   . Essential hypertension   . Family history of adverse reaction to anesthesia    "oldest sister, [name], related to brain formation at back of head; when they put her to sleep it's hard to wake her up"  . GERD (gastroesophageal reflux disease)   . History of blood transfusion    "related to ruptured tubal pregnancy"  . Hyperlipidemia   . Hypertensive heart disease   . Morbid obesity ([place])   . Type II diabetes mellitus ([place])     Current Outpatient Prescriptions  Medication Sig Dispense Refill  . aspirin 81 MG tablet Take 81 mg by mouth daily.    [name] atorvastatin (LIPITOR) 80 MG tablet TAKE 1 TABLET BY MOUTH EVERY EVENING 90 tablet 1  . carvedilol (COREG) 25 MG tablet TAKE 1  TABLET BY MOUTH TWICE A DAY 60 tablet 5  . clopidogrel (PLAVIX) 75 MG tablet TAKE 1 TABLET (75 MG TOTAL) BY MOUTH DAILY. 90 tablet 2  . DiphenhydrAMINE HCl, Sleep, (UNISOM SLEEPGELS PO) Take 1 tablet by mouth at bedtime as needed (SLEEP).    [name] doxazosin (CARDURA) 4 MG tablet TAKE 1 TABLET (4 MG TOTAL) BY MOUTH AT BEDTIME. 90 tablet 0  . ferrous sulfate 325 (65 FE) MG tablet TAKE 1 TABLET (325 MG TOTAL) BY MOUTH 3 (THREE) TIMES DAILY WITH MEALS. 90 tablet 3  . furosemide (LASIX) 20 MG tablet TAKE 1 TABLET (20 MG TOTAL) BY MOUTH DAILY. 90 tablet 1  . glipiZIDE (GLUCOTROL) 10 MG tablet TAKE 1 TABLET (10 MG TOTAL) BY MOUTH 2 (TWO) TIMES DAILY BEFORE A MEAL. 180 tablet 0  . Insulin Pen Needle 31G X 5 MM MISC 1 each by Does not apply route daily as needed. 100 each 6  . losartan (COZAAR) 100 MG tablet TAKE 1 TABLET (100 MG TOTAL) BY MOUTH DAILY. 90 tablet 0  . metFORMIN (GLUCOPHAGE) 500 MG tablet Take 1 tablet (500 mg total) by mouth 2 (two) times daily with a meal. SCHEDULE FOLLOW UP FOR April 2018 20 tablet 0  . Multiple Vitamins-Minerals (MULTIVITAMIN WITH MINERALS) tablet Take 1 tablet by mouth daily.    [name]  nitroGLYCERIN (NITROSTAT) 0.4 MG SL tablet Place 1 tablet (0.4 mg total) under the tongue every 5 (five) minutes as needed for chest pain (up to 3 doses). 25 tablet 3  . spironolactone (ALDACTONE) 25 MG tablet TAKE 1 TABLET (25 MG TOTAL) BY MOUTH DAILY. 90 tablet 0   No current facility-administered medications for this visit.     Allergies  Allergen Reactions  . Iodinated Diagnostic Agents Hives  . Sulfa Antibiotics Hives    Family History  Problem Relation Age of Onset  . Diabetes Mother   . Heart attack Mother   . Hypertension Mother   . Hyperlipidemia Mother   . Heart disease Mother   . Heart attack Father   . Stroke Father   . Hypertension Father   . Hyperlipidemia Father   . Stomach cancer Sister   . Diabetes Sister   . Diabetes Sister   . Diabetes Sister     Social  History   Social History  . Marital status: Divorced    Spouse name: N/A  . Number of children: 3  . Years of education: doctorate   Occupational History  . Teacher    Social History Main Topics  . Smoking status: Never Smoker  . Smokeless tobacco: Never Used  . Alcohol use No  . Drug use: No  . Sexual activity: Not Currently   Other Topics Concern  . Not on file   Social History Narrative   Caffeine use-yes   Regular exercise-no     Constitutional: Denies fever, malaise, fatigue, headache or abrupt weight changes.  Respiratory: Denies difficulty breathing, shortness of breath, cough or sputum production.   Cardiovascular: Denies chest pain, chest tightness, palpitations or swelling in the hands or feet.  Gastrointestinal: Pt reports increased thirst. Denies abdominal pain, bloating, constipation, diarrhea or blood in the stool.  GU: Denies urgency, frequency, pain with urination, burning sensation, blood in urine, odor or discharge. Skin: Denies redness, rashes, lesions or ulcercations.  Neurological: Pt reports numbness and tingling in her feet. Denies dizziness, difficulty with memory, difficulty with speech or problems with balance and coordination.    No other specific complaints in a complete review of systems (except as listed in HPI above).  Objective:   Physical Exam   BP (!) 154/100   Pulse 70   Temp 97.9 F (36.6 C) (Oral)   Wt (!) 307 lb 4 oz (139.4 kg)   SpO2 99%   BMI 51.13 kg/m  Wt Readings from Last 3 Encounters:  02/26/17 (!) 307 lb 4 oz (139.4 kg)  11/16/16 (!) 304 lb (137.9 kg)  10/16/16 (!) 304 lb 12 oz (138.2 kg)    General: Appears her stated age, obese in NAD. Skin: Warm, dry and intact. No ulcerations noted. Cardiovascular: Normal rate and rhythm. S1,S2 noted.  No murmur, rubs or gallops noted. Pulmonary/Chest: Normal effort and positive vesicular breath sounds. No respiratory distress. No wheezes, rales or ronchi noted.    Neurological: Sensation intact to BLE.    BMET    Component Value Date/Time   NA 138 11/16/2016 1212   K 4.4 11/16/2016 1212   CL 102 11/16/2016 1212   CO2 27 11/16/2016 1212   GLUCOSE 443 (H) 11/16/2016 1212   BUN 23 (H) 11/16/2016 1212   CREATININE 1.67 (H) 11/16/2016 1212   CREATININE 1.55 (H) 08/10/2016 0850   CALCIUM 9.2 11/16/2016 1212   GFRNONAA 30 (L) 11/16/2016 1212   GFRAA 35 (L) 11/16/2016 1212    Lipid Panel  Component Value Date/Time   CHOL 168 02/26/2017 1329   TRIG 122.0 02/26/2017 1329   HDL 43.30 02/26/2017 1329   CHOLHDL 4 02/26/2017 1329   VLDL 24.4 02/26/2017 1329   LDLCALC 101 (H) 02/26/2017 1329    CBC    Component Value Date/Time   WBC 5.9 11/16/2016 1212   RBC 4.98 11/16/2016 1212   HGB 11.1 (L) 11/16/2016 1212   HCT 35.8 (L) 11/16/2016 1212   PLT 199 11/16/2016 1212   MCV 71.9 (L) 11/16/2016 1212   MCH 22.3 (L) 11/16/2016 1212   MCHC 31.0 11/16/2016 1212   RDW 15.8 (H) 11/16/2016 1212   LYMPHSABS 1.5 11/16/2016 1212   MONOABS 0.3 11/16/2016 1212   EOSABS 0.1 11/16/2016 1212   BASOSABS 0.0 11/16/2016 1212    Hgb A1C Lab Results  Component Value Date   HGBA1C 14.1 (H) 02/26/2017           Assessment & Plan:

## 2017-02-28 ENCOUNTER — Other Ambulatory Visit: Payer: Self-pay | Admitting: Internal Medicine

## 2017-02-28 ENCOUNTER — Telehealth: Payer: Self-pay | Admitting: Internal Medicine

## 2017-02-28 ENCOUNTER — Encounter: Payer: Self-pay | Admitting: Internal Medicine

## 2017-02-28 DIAGNOSIS — E119 Type 2 diabetes mellitus without complications: Secondary | ICD-10-CM

## 2017-02-28 MED ORDER — LIRAGLUTIDE 18 MG/3ML ~~LOC~~ SOPN: PEN_INJECTOR | SUBCUTANEOUS | 1 refills | Status: DC

## 2017-02-28 MED ORDER — METFORMIN HCL 1000 MG PO TABS: 1000.0000 mg | ORAL_TABLET | Freq: Two times a day (BID) | ORAL | 1 refills | Status: DC

## 2017-02-28 NOTE — Patient Instructions (Signed)
Carbohydrate Counting for Diabetes Mellitus, Adult Carbohydrate counting is a method for keeping track of how many carbohydrates you eat. Eating carbohydrates naturally increases the amount of sugar (glucose) in the blood. Counting how many carbohydrates you eat helps keep your blood glucose within normal limits, which helps you manage your diabetes (diabetes mellitus). It is important to know how many carbohydrates you can safely have in each meal. This is different for every person. A diet and nutrition specialist (registered dietitian) can help you make a meal plan and calculate how many carbohydrates you should have at each meal and snack. Carbohydrates are found in the following foods:  Grains, such as breads and cereals.  Dried beans and soy products.  Starchy vegetables, such as potatoes, peas, and corn.  Fruit and fruit juices.  Milk and yogurt.  Sweets and snack foods, such as cake, cookies, candy, chips, and soft drinks. How do I count carbohydrates? There are two ways to count carbohydrates in food. You can use either of the methods or a combination of both. Reading "Nutrition Facts" on packaged food  The "Nutrition Facts" list is included on the labels of almost all packaged foods and beverages in the [place] It includes:  The serving size.  Information about nutrients in each serving, including the grams (g) of carbohydrate per serving. To use the "Nutrition Facts":  Decide how many servings you will have.  Multiply the number of servings by the number of carbohydrates per serving.  The resulting number is the total amount of carbohydrates that you will be having. Learning standard serving sizes of other foods  When you eat foods containing carbohydrates that are not packaged or do not include "Nutrition Facts" on the label, you need to measure the servings in order to count the amount of carbohydrates:  Measure the foods that you will eat with a food scale or measuring  cup, if needed.  Decide how many standard-size servings you will eat.  Multiply the number of servings by 15. Most carbohydrate-rich foods have about 15 g of carbohydrates per serving.  For example, if you eat 8 oz (170 g) of strawberries, you will have eaten 2 servings and 30 g of carbohydrates (2 servings x 15 g = 30 g).  For foods that have more than one food mixed, such as soups and casseroles, you must count the carbohydrates in each food that is included. The following list contains standard serving sizes of common carbohydrate-rich foods. Each of these servings has about 15 g of carbohydrates:   hamburger bun or  English muffin.   oz (15 mL) syrup.   oz (14 g) jelly.  1 slice of bread.  1 six-inch tortilla.  3 oz (85 g) cooked rice or pasta.  4 oz (113 g) cooked dried beans.  4 oz (113 g) starchy vegetable, such as peas, corn, or potatoes.  4 oz (113 g) hot cereal.  4 oz (113 g) mashed potatoes or  of a large baked potato.  4 oz (113 g) canned or frozen fruit.  4 oz (120 mL) fruit juice.  4-6 crackers.  6 chicken nuggets.  6 oz (170 g) unsweetened dry cereal.  6 oz (170 g) plain fat-free yogurt or yogurt sweetened with artificial sweeteners.  8 oz (240 mL) milk.  8 oz (170 g) fresh fruit or one small piece of fruit.  24 oz (680 g) popped popcorn. Example of carbohydrate counting Sample meal  3 oz (85 g) chicken breast.  6 oz (  170 g) brown rice.  4 oz (113 g) corn.  8 oz (240 mL) milk.  8 oz (170 g) strawberries with sugar-free whipped topping. Carbohydrate calculation 1. Identify the foods that contain carbohydrates:  Rice.  Corn.  Milk.  Strawberries. 2. Calculate how many servings you have of each food:  2 servings rice.  1 serving corn.  1 serving milk.  1 serving strawberries. 3. Multiply each number of servings by 15 g:  2 servings rice x 15 g = 30 g.  1 serving corn x 15 g = 15 g.  1 serving milk x 15 g = 15  g.  1 serving strawberries x 15 g = 15 g. 4. Add together all of the amounts to find the total grams of carbohydrates eaten:  30 g + 15 g + 15 g + 15 g = 75 g of carbohydrates total. This information is not intended to replace advice given to you by your health care provider. Make sure you discuss any questions you have with your health care provider. Document Released: 11/05/2005 Document Revised: 05/25/2016 Document Reviewed: 04/18/2016 Elsevier Interactive Patient Education  2017 [employer]vier Inc.  

## 2017-02-28 NOTE — Assessment & Plan Note (Signed)
Elevated today There is a question about medication compliance Pt refusing to start on additional medication at this time  RTC in 1 month to recheck BP

## 2017-02-28 NOTE — Assessment & Plan Note (Signed)
Repeat A1C and Lipid profile today Encouraged her to consume a low carb, low fat diet and exercise for weight loss Offered referral to [facility]- she declines Foot exam today Encouraged yearly eye exams Immunizations are UTD

## 2017-02-28 NOTE — Telephone Encounter (Signed)
Pt called stating she received my chart message for labs.  She would like a call to explain what it means

## 2017-02-28 NOTE — Addendum Note (Signed)
Addended by: Lurlean Nanny on: 02/28/2017 02:18 PM   Modules accepted: Orders

## 2017-03-08 ENCOUNTER — Telehealth: Payer: Self-pay | Admitting: Internal Medicine

## 2017-03-08 NOTE — Telephone Encounter (Signed)
New message     Pt states she is having problems with pressure and angina, no sob or dizziness.  Pt wanted to wait until end of May , I got her to take appt with Richardson Dopp 4/27 at 1215pm

## 2017-03-11 NOTE — Telephone Encounter (Signed)
Called patient to check if she is having acute chest pressure.   It has been occurring since January.  She  was having problems controlling her blood sugar.  She thought that was the reason for her symptoms.  Out of breath walking around, squeezing chest pressure.  Diabetes medications were changed.  She thought this would make a difference.  Minimal exertion causes the chest pressure.-, washing clothes, cooking, walking upstairs.  This goes away eventually when she stops the activity, sits down and rests.   This occurs every day.   She is nervous to go to bed at night because  her mom died in her sleep.     Advised patient to try to avoid the activities that cause her symptoms and keep appointment for 4/27.  Informed reasons to go to the ER: if pain comes back and does not go away with rest/stopping activity, if it occurs with activity but is worse than normal, if it occurs at rest.  Pt verbalizes understanding and agreement.

## 2017-03-15 ENCOUNTER — Encounter: Payer: Self-pay | Admitting: Physician Assistant

## 2017-03-15 ENCOUNTER — Ambulatory Visit (INDEPENDENT_AMBULATORY_CARE_PROVIDER_SITE_OTHER): Payer: Medicare Other | Admitting: Physician Assistant

## 2017-03-15 VITALS — BP 122/76 | HR 78 | Ht 65.0 in | Wt 308.8 lb

## 2017-03-15 DIAGNOSIS — I10 Essential (primary) hypertension: Secondary | ICD-10-CM | POA: Diagnosis not present

## 2017-03-15 DIAGNOSIS — Z91041 Radiographic dye allergy status: Secondary | ICD-10-CM

## 2017-03-15 DIAGNOSIS — N183 Chronic kidney disease, stage 3 unspecified: Secondary | ICD-10-CM

## 2017-03-15 DIAGNOSIS — E118 Type 2 diabetes mellitus with unspecified complications: Secondary | ICD-10-CM | POA: Diagnosis not present

## 2017-03-15 DIAGNOSIS — E7800 Pure hypercholesterolemia, unspecified: Secondary | ICD-10-CM | POA: Diagnosis not present

## 2017-03-15 DIAGNOSIS — I25118 Atherosclerotic heart disease of native coronary artery with other forms of angina pectoris: Secondary | ICD-10-CM | POA: Diagnosis not present

## 2017-03-15 MED ORDER — PREDNISONE 20 MG PO TABS: 60.0000 mg | ORAL_TABLET | ORAL | 0 refills | Status: DC

## 2017-03-15 MED ORDER — ISOSORBIDE MONONITRATE ER 30 MG PO TB24: 30.0000 mg | ORAL_TABLET | Freq: Every day | ORAL | 3 refills | Status: DC

## 2017-03-15 NOTE — Patient Instructions (Addendum)
Medication Instructions:  1. START IMDUR 30 MG ONCE A DAY; RX SENT IN TODAY  2. YOU HAVE BEEN GIVEN AN RX FOR PREDNISONE 20 MG TABLET WITH THE DIRECTION TO READ: THE DAY BEFORE CATH YOU WILL TAKE 3 TABLETS EVERY 12 HOURS (TWICE DAILY) THIS DAY WILL BE A TOTAL OF 6 TABLETS ON Sunday 03/17/17; THEN THE MORNING OF THE CATH 4/30 YOU WILL TAKE 3 TABLETS.  Labwork: TODAY BMET, CBC, PT/INR; PRE CATH FOR Monday 04/17/17  Testing/Procedures: Your physician has requested that you have a cardiac catheterization. Cardiac catheterization is used to diagnose and/or treat various heart conditions. Doctors may recommend this procedure for a number of different reasons. The most common reason is to evaluate chest pain. Chest pain can be a symptom of coronary artery disease (CAD), and cardiac catheterization can show whether plaque is narrowing or blocking your heart's arteries. This procedure is also used to evaluate the valves, as well as measure the blood flow and oxygen levels in different parts of your heart. For further information please visit [email or web address]. Please follow instruction sheet, as given.  Follow-Up: 04/08/17 @ 12:15 WITH SCOTT WEAVER, PAC  Any Other Special Instructions Will Be Listed Below (If Applicable). If you need a refill on your cardiac medications before your next appointment, please call your pharmacy.    [facility] [address], [place] [address] [place] [address] Dept: [phone] Loc: [phone]  Alicia [name]  03/15/2017  You are scheduled for a Cardiac Catheterization on Monday, April 30 with Dr. [name] @ 17 NOON.  1. Please arrive at the [facility] (Main Entrance A) at [facility]: [address] [place], [place] [address] at 8:00 AM (two hours before your procedure to ensure your preparation). Free valet parking service is available.   Special note: Every effort is  made to have your procedure done on time. Please understand that emergencies sometimes delay scheduled procedures.  2. Diet: Do not eat or drink anything after midnight prior to your procedure except sips of water to take medications.  3. Labs: You will need to have blood drawn on Friday, April 27 at [facility] at [facility]. [address]  Open: 7:30am - 5pm    Phone: [phone]. You do not need to be fasting.  4. Medication instructions in preparation for your procedure:  1. DO NOT TAKE ANY METFORMIN ON 03/17/17 THE DAY BEFORE CATH, DO NOT TAKE THE DAY OF CATH AND YOU WILL NEED TO HOLD METFORMIN FOR 48 HOURS AFTER CATH  2. THE DAY BEFORE CATH YOU WILL ALSO NEED TO HOLD GLUCOTROL, LOSARTAN, LASIX AND SPIRONOLACTONE; AFTER YOUR CATH YOU WILL BE ABLE TO RESUME THESE MEDICATIONS  On the morning of your procedure, take your Aspirin 81 MG and PLAVIX 75 MG any morning medicines NOT listed above.  You may use sips of water.  5. Plan for one night stay--bring personal belongings. 6. Bring a current list of your medications and current insurance cards. 7. You MUST have a responsible person to drive you home. 8. Someone MUST be with you the first 24 hours after you arrive home or your discharge will be delayed. 9. Please wear clothes that are easy to get on and off and wear slip-on shoes.  Thank you for allowing [place] to care for you!   -- [facility]

## 2017-03-15 NOTE — Progress Notes (Signed)
Cardiology Office Note:    Date:  03/15/2017   ID:  Alicia [name], DOB [birth year], MRN [id number]  PCP:  Webb Silversmith, NP  Cardiologist:  Dr. [name]   Electrophysiologist:  n/a  Referring MD: Jearld Fenton, NP   Chief Complaint  Patient presents with  . Chest Pain    History of Present Illness:    Alicia [name] is a 71 y.o. female with a hx of CAD, HTN, HL, diabetes. She underwent cardiac catheterization 12/16 for shortness of breath and chest pain concerning for an several angina. This demonstrated high-grade stenosis in the mid LAD treated with a Synergy DES. Last seen by Dr. Harrington Challenger 9/17.   She returns for evaluation of chest pain.  She is here alone.  She describes progressively worsening chest pressure over the past 4-5 weeks. She has noted assoc dyspnea, nausea and diaphoresis.  She has had some rest symptoms. She has not taken NTG.  She denies PND. She has had some LE edema (L>R).  She denies cough, hematochezia.  Her stools are dark from [facility].   Prior CV studies:   The following studies were reviewed today:  Echo 10/21/15 Moderate concentric LVH, EF 60-65, normal wall motion, MAC, moderate to severe LAE  LHC 10/21/15 LAD mid 25, D1 60 LCx irregularities RCA mid 75 PCI: 4 x 16 mm Synergy DES to the mid RCA  Echo 10/20/15 Moderate LVH, EF 60-65, normal wall motion, grade 1 diastolic dysfunction, aortic sclerosis without stenosis, MAC, severe LAE, normal RVSF, moderate TR, PASP 38  Past Medical History:  Diagnosis Date  . Anemia   . Arthritis    "right knee" (10/21/2015)  . CAD (coronary artery disease)    a. Cath 10/21/15: s/p DES to RCA: moderate diffuse stenosis of mod diagonal, diffuse irregularity LCx and LAD. 2D echo 10/21/15: mod LVH, EF 60-65%, no RWMA, calcified MV, mod-severe LAE.  [name] Cataract   . Essential hypertension   . Family history of adverse reaction to anesthesia    "oldest sister, [name], related to brain formation at back of head; when they put  her to sleep it's hard to wake her up"  . GERD (gastroesophageal reflux disease)   . History of blood transfusion    "related to ruptured tubal pregnancy"  . Hyperlipidemia   . Hypertensive heart disease   . Morbid obesity ([place])   . Type II diabetes mellitus ([place])     Past Surgical History:  Procedure Laterality Date  . ABDOMINAL HYSTERECTOMY  1988  . APPENDECTOMY  1985  . [place] SURGERY  1984  . CARDIAC CATHETERIZATION N/A 10/21/2015   Procedure: Right/Left Heart Cath and Coronary Angiography;  Surgeon: [name], MD;  Location: [facility];  Service: Cardiovascular;  Laterality: N/A;  . CHOLECYSTECTOMY OPEN  1984  . ECTOPIC PREGNANCY SURGERY    . REDUCTION MAMMAPLASTY Bilateral ~ 1976  . TONSILLECTOMY      Current Medications: Current Meds  Medication Sig  . aspirin 81 MG tablet Take 81 mg by mouth daily.  [name] atorvastatin (LIPITOR) 80 MG tablet TAKE 1 TABLET BY MOUTH EVERY EVENING  . B-D UF III MINI PEN NEEDLES 31G X 5 MM MISC USE DAILY AS NEEDED  . carvedilol (COREG) 25 MG tablet TAKE 1 TABLET BY MOUTH TWICE A DAY  . clopidogrel (PLAVIX) 75 MG tablet TAKE 1 TABLET (75 MG TOTAL) BY MOUTH DAILY.  . DiphenhydrAMINE HCl, Sleep, (UNISOM SLEEPGELS PO) Take 1 tablet by  mouth at bedtime as needed (SLEEP).  [name] doxazosin (CARDURA) 4 MG tablet TAKE 1 TABLET (4 MG TOTAL) BY MOUTH AT BEDTIME.  . ferrous sulfate 325 (65 FE) MG tablet TAKE 1 TABLET (325 MG TOTAL) BY MOUTH 3 (THREE) TIMES DAILY WITH MEALS.  . furosemide (LASIX) 20 MG tablet TAKE 1 TABLET (20 MG TOTAL) BY MOUTH DAILY.  [name] glipiZIDE (GLUCOTROL) 10 MG tablet TAKE 1 TABLET (10 MG TOTAL) BY MOUTH 2 (TWO) TIMES DAILY BEFORE A MEAL.  [name] liraglutide 18 MG/3ML SOPN Inject 0.2 mLs (1.2 mg total) into the skin daily  . losartan (COZAAR) 100 MG tablet TAKE 1 TABLET (100 MG TOTAL) BY MOUTH DAILY.  . metFORMIN (GLUCOPHAGE) 1000 MG tablet Take 1 tablet (1,000 mg total) by mouth 2 (two) times daily with a meal.  . Multiple  Vitamins-Minerals (MULTIVITAMIN WITH MINERALS) tablet Take 1 tablet by mouth daily.  . nitroGLYCERIN (NITROSTAT) 0.4 MG SL tablet Place 1 tablet (0.4 mg total) under the tongue every 5 (five) minutes as needed for chest pain (up to 3 doses).  [name] spironolactone (ALDACTONE) 25 MG tablet TAKE 1 TABLET (25 MG TOTAL) BY MOUTH DAILY.     Allergies:   Iodinated diagnostic agents and Sulfa antibiotics   Social History   Social History  . Marital status: Divorced    Spouse name: N/A  . Number of children: 3  . Years of education: doctorate   Occupational History  . Teacher    Social History Main Topics  . Smoking status: Never Smoker  . Smokeless tobacco: Never Used  . Alcohol use No  . Drug use: No  . Sexual activity: Not Currently   Other Topics Concern  . None   Social History Narrative   Caffeine use-yes   Regular exercise-no     Family History  Problem Relation Age of Onset  . Diabetes Mother   . Heart attack Mother   . Hypertension Mother   . Hyperlipidemia Mother   . Heart disease Mother   . Heart attack Father   . Stroke Father   . Hypertension Father   . Hyperlipidemia Father   . Stomach cancer Sister   . Diabetes Sister   . Diabetes Sister   . Diabetes Sister      ROS:   Please see the history of present illness.    Review of Systems  Cardiovascular: Positive for chest pain, dyspnea on exertion and leg swelling.   All other systems reviewed and are negative.   EKGs/Labs/Other Test Reviewed:    EKG:  EKG is  ordered today.  The ekg ordered today demonstratesNSR, HR 78, normal axis, T-wave inversions 3, aVF, V3-V6, QTc 437 ms-T wave changes are more prominent/diffuse since prior tracing 12/16   Recent Labs: 08/10/2016: Brain Natriuretic Peptide 79.0; TSH 1.83 11/15/2016: ALT 17 11/16/2016: BUN 23; Creatinine, Ser 1.67; Hemoglobin 11.1; Platelets 199; Potassium 4.4; Sodium 138   Recent Lipid Panel    Component Value Date/Time   CHOL 168 02/26/2017 1329    TRIG 122.0 02/26/2017 1329   HDL 43.30 02/26/2017 1329   CHOLHDL 4 02/26/2017 1329   VLDL 24.4 02/26/2017 1329   LDLCALC 101 (H) 02/26/2017 1329     Physical Exam:    VS:  BP 122/76   Pulse 78   Ht _0  (1.651 m)   Wt (!) 308 lb 12.8 oz (140.1 kg)   SpO2 98%   BMI 51.39 kg/m     Wt Readings from Last 3 Encounters:  03/15/17 (!) 308 lb 12.8 oz (140.1 kg)  02/26/17 (!) 307 lb 4 oz (139.4 kg)  11/16/16 (!) 304 lb (137.9 kg)     Physical Exam  Constitutional: She is oriented to person, place, and time. She appears well-developed and well-nourished. No distress.  HENT:  Head: Normocephalic and atraumatic.  Eyes: No scleral icterus.  Neck: Normal range of motion. No JVD (no obvious JVD) present.  Cardiovascular: Normal rate, regular rhythm, S1 normal, S2 normal and normal heart sounds.   No murmur heard. Pulmonary/Chest: Breath sounds normal. She has no wheezes. She has no rhonchi. She has no rales.  Abdominal: Soft. There is no tenderness.  Musculoskeletal: She exhibits edema (trace bilateral LE edema).  Neurological: She is alert and oriented to person, place, and time.  Skin: Skin is warm and dry.  Psychiatric: She has a normal mood and affect.    ASSESSMENT:    1. Coronary artery disease involving native coronary artery of native heart with other form of angina pectoris ([place])   2. CKD (chronic kidney disease) stage 3, GFR 30-59 ml/min   3. Type 2 diabetes mellitus with complication, without long-term current use of insulin ([place])   4. Essential hypertension   5. Pure hypercholesterolemia   6. Allergic to IV contrast    PLAN:    In order of problems listed above:  1. Coronary artery disease involving native coronary artery of native heart with other form of angina pectoris [facility])- She is s/p DES to RCA in 2016. She now presents with symptoms c/w CCS class 3 angina. She has inf-lat TWI on her ECG that are more prominent.  Her DM2 is uncontrolled with last A1c > 14.   I have recommend proceeding with cardiac catheterization.  I reviewed this with Dr. Marlou Porch who agreed.  Risks and benefits of cardiac catheterization have been discussed with the patient.  These include bleeding, infection, kidney damage, stroke, heart attack, death.  The patient understands these risks and is willing to proceed. There is no availability in the cath lab today.  Therefore, we will have to proceed Monday 4/30.  She does have CKD and a dye allergy.  -  Start Imdur 30 mg QD  -  Labs today - BMET, CBC, INR  -  She knows to go to the ED if she has recurrent/worsening symptoms  -  Will give Prednisone for dye allergy prophylaxis and give Benadryl/Pepcid on call to cath lab  -  R radial cannot be used due to tortuosity noted on last cath  -  Hold Metformin 24 hours before and 48 hours after cath  -  Hold ARB, Lasix, Spironolactone 24 hours prior to cath  -  Arrive early for fluids prior to cath  2. CKD (chronic kidney disease) stage 3, GFR 30-59 ml/min - Will hold medications with potential renal harm and hydrate with fluids prior to cath.   3. Type 2 diabetes mellitus with complication, without long-term current use of insulin ([place])- Uncontrolled.  FU with PCP.  Hold Metformin 24 hours before and 48 hours after cath.   4. Essential hypertension - Controlled.   5. Pure hypercholesterolemia - Continue statin   6. Allergic to IV contrast- Will given prednisone 60 bid day before cath, then 60 in AM of cath; Benadryl and Pepcid IV on call to cath lab.   Dispo:  Return in about 2 weeks (around 03/29/2017) for Post Procedure Follow Up, w/ Dr. Harrington Challenger, w/ Richardson Dopp, PA-C.   Medication  Adjustments/Labs and Tests Ordered: Current medicines are reviewed at length with the patient today.  Concerns regarding medicines are outlined above.  Orders placed this visit:  Orders Placed This Encounter  Procedures  . Basic Metabolic Panel (BMET)  . CBC  . INR/PT  . EKG 12-Lead   Medication  changes this visit: Meds ordered this encounter  Medications  . predniSONE (DELTASONE) 20 MG tablet    Sig: Take 3 tablets (60 mg total) by mouth as directed. Take 3 tablets every 12 hours (twice daily) on Sunday 4/29, then 3 tablets again on Monday morning of cath 4/30    Dispense:  9 tablet    Refill:  0  . isosorbide mononitrate (IMDUR) 30 MG 24 hr tablet    Sig: Take 1 tablet (30 mg total) by mouth daily.    Dispense:  90 tablet    Refill:  3    Signed, Richardson Dopp, PA-C  03/15/2017 1:34 PM    [facility] [place], [place], [place]  [address] Phone: [phone]; Fax: [phone]

## 2017-03-17 ENCOUNTER — Other Ambulatory Visit: Payer: Self-pay | Admitting: Internal Medicine

## 2017-03-17 DIAGNOSIS — I10 Essential (primary) hypertension: Secondary | ICD-10-CM

## 2017-03-18 ENCOUNTER — Encounter (HOSPITAL_COMMUNITY): Payer: Self-pay | Admitting: General Practice

## 2017-03-18 ENCOUNTER — Ambulatory Visit (HOSPITAL_COMMUNITY)
Admission: RE | Disposition: A | Discharge status: Home / Self Care | Payer: Self-pay | Source: Ambulatory Visit | Attending: Cardiology

## 2017-03-18 ENCOUNTER — Ambulatory Visit (HOSPITAL_COMMUNITY)
Admission: RE | Admit: 2017-03-18 | Discharge: 2017-03-19 | Disposition: A | Discharge status: Home / Self Care | Payer: Medicare Other | Source: Ambulatory Visit | Attending: Cardiology | Admitting: Cardiology

## 2017-03-18 DIAGNOSIS — E785 Hyperlipidemia, unspecified: Secondary | ICD-10-CM | POA: Diagnosis not present

## 2017-03-18 DIAGNOSIS — Z882 Allergy status to sulfonamides status: Secondary | ICD-10-CM | POA: Diagnosis not present

## 2017-03-18 DIAGNOSIS — N183 Chronic kidney disease, stage 3 unspecified: Secondary | ICD-10-CM | POA: Diagnosis present

## 2017-03-18 DIAGNOSIS — E1165 Type 2 diabetes mellitus with hyperglycemia: Secondary | ICD-10-CM | POA: Diagnosis present

## 2017-03-18 DIAGNOSIS — Z7902 Long term (current) use of antithrombotics/antiplatelets: Secondary | ICD-10-CM | POA: Insufficient documentation

## 2017-03-18 DIAGNOSIS — I2510 Atherosclerotic heart disease of native coronary artery without angina pectoris: Secondary | ICD-10-CM

## 2017-03-18 DIAGNOSIS — Z7982 Long term (current) use of aspirin: Secondary | ICD-10-CM | POA: Diagnosis not present

## 2017-03-18 DIAGNOSIS — R7989 Other specified abnormal findings of blood chemistry: Secondary | ICD-10-CM

## 2017-03-18 DIAGNOSIS — R601 Generalized edema: Secondary | ICD-10-CM

## 2017-03-18 DIAGNOSIS — E1122 Type 2 diabetes mellitus with diabetic chronic kidney disease: Secondary | ICD-10-CM | POA: Diagnosis not present

## 2017-03-18 DIAGNOSIS — I25110 Atherosclerotic heart disease of native coronary artery with unstable angina pectoris: Secondary | ICD-10-CM | POA: Insufficient documentation

## 2017-03-18 DIAGNOSIS — Z6841 Body Mass Index (BMI) 40.0 and over, adult: Secondary | ICD-10-CM | POA: Diagnosis not present

## 2017-03-18 DIAGNOSIS — I129 Hypertensive chronic kidney disease with stage 1 through stage 4 chronic kidney disease, or unspecified chronic kidney disease: Secondary | ICD-10-CM | POA: Diagnosis not present

## 2017-03-18 DIAGNOSIS — Z955 Presence of coronary angioplasty implant and graft: Secondary | ICD-10-CM | POA: Insufficient documentation

## 2017-03-18 DIAGNOSIS — Z91041 Radiographic dye allergy status: Secondary | ICD-10-CM | POA: Diagnosis not present

## 2017-03-18 DIAGNOSIS — E66813 Obesity, class 3: Secondary | ICD-10-CM | POA: Diagnosis present

## 2017-03-18 DIAGNOSIS — IMO0002 Reserved for concepts with insufficient information to code with codable children: Secondary | ICD-10-CM | POA: Diagnosis present

## 2017-03-18 DIAGNOSIS — I25118 Atherosclerotic heart disease of native coronary artery with other forms of angina pectoris: Secondary | ICD-10-CM

## 2017-03-18 DIAGNOSIS — I10 Essential (primary) hypertension: Secondary | ICD-10-CM | POA: Diagnosis present

## 2017-03-18 DIAGNOSIS — I200 Unstable angina: Secondary | ICD-10-CM | POA: Diagnosis present

## 2017-03-18 DIAGNOSIS — Z794 Long term (current) use of insulin: Secondary | ICD-10-CM | POA: Insufficient documentation

## 2017-03-18 DIAGNOSIS — I119 Hypertensive heart disease without heart failure: Secondary | ICD-10-CM

## 2017-03-18 DIAGNOSIS — Z9861 Coronary angioplasty status: Secondary | ICD-10-CM

## 2017-03-18 HISTORY — PX: LEFT HEART CATH AND CORONARY ANGIOGRAPHY: CATH118249

## 2017-03-18 HISTORY — PX: CORONARY STENT INTERVENTION: CATH118234

## 2017-03-18 HISTORY — PX: CORONARY STENT PLACEMENT: SHX1402

## 2017-03-18 LAB — GLUCOSE, CAPILLARY
GLUCOSE-CAPILLARY: 277 mg/dL — AB (ref 65–99)
Glucose-Capillary: 347 mg/dL — ABNORMAL HIGH (ref 65–99)
Glucose-Capillary: 348 mg/dL — ABNORMAL HIGH (ref 65–99)
Glucose-Capillary: 333 mg/dL — ABNORMAL HIGH (ref 65–99)
Glucose-Capillary: 328 mg/dL — ABNORMAL HIGH (ref 65–99)

## 2017-03-18 LAB — PROTIME-INR
INR: 1 (ref 0.8–1.2)
Prothrombin Time: 10.8 s (ref 9.1–12.0)

## 2017-03-18 LAB — BASIC METABOLIC PANEL
BUN/Creatinine Ratio: 17 (ref 12–28)
BUN: 28 mg/dL — ABNORMAL HIGH (ref 8–27)
CO2: 25 mmol/L (ref 18–29)
CREATININE: 1.66 mg/dL — AB (ref 0.57–1.00)
Calcium: 9.5 mg/dL (ref 8.7–10.3)
Chloride: 100 mmol/L (ref 96–106)
GFR calc Af Amer: 36 mL/min/{1.73_m2} — ABNORMAL LOW (ref >59)
GFR, EST NON AFRICAN AMERICAN: 31 mL/min/{1.73_m2} — AB (ref >59)
Glucose: 174 mg/dL — ABNORMAL HIGH (ref 65–99)
POTASSIUM: 4.5 mmol/L (ref 3.5–5.2)
SODIUM: 143 mmol/L (ref 134–144)

## 2017-03-18 LAB — CBC
Hematocrit: 34.7 % (ref 34.0–46.6)
Hemoglobin: 10.6 g/dL — ABNORMAL LOW (ref 11.1–15.9)
MCH: 22.1 pg — AB (ref 26.6–33.0)
MCHC: 30.5 g/dL — AB (ref 31.5–35.7)
MCV: 72 fL — AB (ref 79–97)
PLATELETS: 208 10*3/uL (ref 150–379)
RBC: 4.79 x10E6/uL (ref 3.77–5.28)
RDW: 16.2 % — ABNORMAL HIGH (ref 12.3–15.4)
WBC: 6.9 10*3/uL (ref 3.4–10.8)

## 2017-03-18 LAB — POCT ACTIVATED CLOTTING TIME: ACTIVATED CLOTTING TIME: 296 s

## 2017-03-18 LAB — TROPONIN I
TROPONIN I: 0.04 ng/mL — AB (ref <0.03)
Troponin I: <0.03 ng/mL (ref <0.03)

## 2017-03-18 SURGERY — LEFT HEART CATH AND CORONARY ANGIOGRAPHY
Anesthesia: LOCAL

## 2017-03-18 MED ORDER — SODIUM CHLORIDE 0.9 % IV SOLN
INTRAVENOUS | Status: AC
  Administered 2017-03-18: 22:00:00 via INTRAVENOUS

## 2017-03-18 MED ORDER — HYDRALAZINE HCL 20 MG/ML IJ SOLN
INTRAMUSCULAR | Status: AC
  Filled 2017-03-18: qty 1

## 2017-03-18 MED ORDER — VERAPAMIL HCL 2.5 MG/ML IV SOLN
INTRAVENOUS | Status: AC
  Filled 2017-03-18: qty 2

## 2017-03-18 MED ORDER — HEPARIN (PORCINE) IN NACL 2-0.9 UNIT/ML-% IJ SOLN
INTRAMUSCULAR | Status: AC
  Filled 2017-03-18: qty 1000

## 2017-03-18 MED ORDER — INSULIN ASPART 100 UNIT/ML ~~LOC~~ SOLN
SUBCUTANEOUS | Status: AC
  Administered 2017-03-18: 5 [IU]
  Filled 2017-03-18: qty 1

## 2017-03-18 MED ORDER — DIPHENHYDRAMINE HCL 50 MG/ML IJ SOLN: 25.0000 mg | INTRAMUSCULAR | Status: DC

## 2017-03-18 MED ORDER — FENTANYL CITRATE (PF) 100 MCG/2ML IJ SOLN
INTRAMUSCULAR | Status: DC | PRN
  Administered 2017-03-18 (×2): 25 ug via INTRAVENOUS
  Administered 2017-03-18: 50 ug via INTRAVENOUS

## 2017-03-18 MED ORDER — INSULIN ASPART 100 UNIT/ML ~~LOC~~ SOLN: 5.0000 [IU] | Freq: Once | SUBCUTANEOUS | Status: DC

## 2017-03-18 MED ORDER — IOPAMIDOL (ISOVUE-370) INJECTION 76%
INTRAVENOUS | Status: DC | PRN
  Administered 2017-03-18: 170 mL via INTRAVENOUS

## 2017-03-18 MED ORDER — DOXAZOSIN MESYLATE 4 MG PO TABS
4.0000 mg | ORAL_TABLET | Freq: Every day | ORAL | Status: DC
  Administered 2017-03-18: 22:00:00 4 mg via ORAL
  Filled 2017-03-18: qty 1

## 2017-03-18 MED ORDER — ASPIRIN EC 81 MG PO TBEC
81.0000 mg | DELAYED_RELEASE_TABLET | Freq: Every day | ORAL | Status: DC
  Administered 2017-03-19: 09:00:00 81 mg via ORAL
  Filled 2017-03-18: qty 1

## 2017-03-18 MED ORDER — LABETALOL HCL 5 MG/ML IV SOLN
INTRAVENOUS | Status: DC | PRN
  Administered 2017-03-18 (×2): 10 mg via INTRAVENOUS

## 2017-03-18 MED ORDER — INSULIN ASPART 100 UNIT/ML ~~LOC~~ SOLN
0.0000 [IU] | Freq: Three times a day (TID) | SUBCUTANEOUS | Status: DC
  Administered 2017-03-18: 8 [IU] via SUBCUTANEOUS
  Administered 2017-03-19: 07:00:00 3 [IU] via SUBCUTANEOUS

## 2017-03-18 MED ORDER — FAMOTIDINE IN NACL 20-0.9 MG/50ML-% IV SOLN
INTRAVENOUS | Status: AC
  Administered 2017-03-18: 20 mg
  Filled 2017-03-18: qty 50

## 2017-03-18 MED ORDER — HEPARIN SODIUM (PORCINE) 1000 UNIT/ML IJ SOLN
INTRAMUSCULAR | Status: AC
  Filled 2017-03-18: qty 1

## 2017-03-18 MED ORDER — INSULIN ASPART 100 UNIT/ML ~~LOC~~ SOLN
5.0000 [IU] | Freq: Once | SUBCUTANEOUS | Status: AC
  Administered 2017-03-18: 5 [IU] via SUBCUTANEOUS
  Filled 2017-03-18: qty 0.05

## 2017-03-18 MED ORDER — ANGIOPLASTY BOOK
Freq: Once | Status: AC
  Administered 2017-03-18: 22:00:00
  Filled 2017-03-18: qty 1

## 2017-03-18 MED ORDER — LABETALOL HCL 5 MG/ML IV SOLN
10.0000 mg | INTRAVENOUS | Status: DC | PRN
Start: 2017-03-18 — End: 2017-03-18
  Administered 2017-03-18 (×2): 10 mg via INTRAVENOUS
  Filled 2017-03-18: qty 4

## 2017-03-18 MED ORDER — IOPAMIDOL (ISOVUE-370) INJECTION 76%
INTRAVENOUS | Status: AC
  Filled 2017-03-18: qty 50

## 2017-03-18 MED ORDER — ISOSORBIDE MONONITRATE ER 30 MG PO TB24
30.0000 mg | ORAL_TABLET | Freq: Every day | ORAL | Status: DC
  Administered 2017-03-19: 30 mg via ORAL
  Filled 2017-03-18: qty 1

## 2017-03-18 MED ORDER — MIDAZOLAM HCL 2 MG/2ML IJ SOLN
INTRAMUSCULAR | Status: AC
  Filled 2017-03-18: qty 2

## 2017-03-18 MED ORDER — CLOPIDOGREL BISULFATE 75 MG PO TABS
75.0000 mg | ORAL_TABLET | Freq: Every day | ORAL | Status: DC
  Administered 2017-03-19: 09:00:00 75 mg via ORAL
  Filled 2017-03-18: qty 1

## 2017-03-18 MED ORDER — SODIUM CHLORIDE 0.9 % WEIGHT BASED INFUSION: 1.0000 mL/kg/h | INTRAVENOUS | Status: DC

## 2017-03-18 MED ORDER — HYDRALAZINE HCL 20 MG/ML IJ SOLN
INTRAMUSCULAR | Status: DC | PRN
  Administered 2017-03-18: 20 mg via INTRAVENOUS

## 2017-03-18 MED ORDER — ATORVASTATIN CALCIUM 80 MG PO TABS
80.0000 mg | ORAL_TABLET | Freq: Every evening | ORAL | Status: DC
  Administered 2017-03-18: 17:00:00 80 mg via ORAL
  Filled 2017-03-18 (×2): qty 1

## 2017-03-18 MED ORDER — SODIUM CHLORIDE 0.9% FLUSH: 3.0000 mL | INTRAVENOUS | Status: DC | PRN

## 2017-03-18 MED ORDER — HEPARIN (PORCINE) IN NACL 2-0.9 UNIT/ML-% IJ SOLN
INTRAMUSCULAR | Status: DC | PRN
  Administered 2017-03-18: 1000 mL

## 2017-03-18 MED ORDER — HYDRALAZINE HCL 20 MG/ML IJ SOLN: 5.0000 mg | INTRAMUSCULAR | Status: DC | PRN

## 2017-03-18 MED ORDER — LIDOCAINE HCL 1 % IJ SOLN
INTRAMUSCULAR | Status: AC
  Filled 2017-03-18: qty 20

## 2017-03-18 MED ORDER — INSULIN ASPART 100 UNIT/ML ~~LOC~~ SOLN
0.0000 [IU] | Freq: Every day | SUBCUTANEOUS | Status: DC
  Administered 2017-03-18: 22:00:00 5 [IU] via SUBCUTANEOUS

## 2017-03-18 MED ORDER — ASPIRIN 81 MG PO CHEW: 81.0000 mg | CHEWABLE_TABLET | ORAL | Status: DC

## 2017-03-18 MED ORDER — FERROUS SULFATE 325 (65 FE) MG PO TABS
325.0000 mg | ORAL_TABLET | Freq: Three times a day (TID) | ORAL | Status: DC
  Administered 2017-03-18 – 2017-03-19 (×2): 325 mg via ORAL
  Filled 2017-03-18 (×2): qty 1

## 2017-03-18 MED ORDER — ONDANSETRON HCL 4 MG/2ML IJ SOLN: 4.0000 mg | Freq: Four times a day (QID) | INTRAMUSCULAR | Status: DC | PRN

## 2017-03-18 MED ORDER — FENTANYL CITRATE (PF) 100 MCG/2ML IJ SOLN
INTRAMUSCULAR | Status: AC
  Filled 2017-03-18: qty 2

## 2017-03-18 MED ORDER — INSULIN ASPART 100 UNIT/ML ~~LOC~~ SOLN
SUBCUTANEOUS | Status: AC
  Filled 2017-03-18: qty 1

## 2017-03-18 MED ORDER — SODIUM CHLORIDE 0.9% FLUSH
3.0000 mL | Freq: Two times a day (BID) | INTRAVENOUS | Status: DC
  Administered 2017-03-18: 21:00:00 3 mL via INTRAVENOUS

## 2017-03-18 MED ORDER — LABETALOL HCL 5 MG/ML IV SOLN
INTRAVENOUS | Status: AC
  Filled 2017-03-18: qty 4

## 2017-03-18 MED ORDER — HEPARIN SODIUM (PORCINE) 1000 UNIT/ML IJ SOLN
INTRAMUSCULAR | Status: DC | PRN
  Administered 2017-03-18: 12000 [IU] via INTRAVENOUS

## 2017-03-18 MED ORDER — CARVEDILOL 12.5 MG PO TABS
25.0000 mg | ORAL_TABLET | Freq: Two times a day (BID) | ORAL | Status: DC
  Administered 2017-03-18 – 2017-03-19 (×2): 25 mg via ORAL
  Filled 2017-03-18 (×2): qty 2

## 2017-03-18 MED ORDER — DIPHENHYDRAMINE HCL 25 MG PO CAPS
ORAL_CAPSULE | Freq: Every evening | ORAL | Status: DC | PRN
  Administered 2017-03-18: 25 mg via ORAL
  Filled 2017-03-18: qty 1

## 2017-03-18 MED ORDER — SODIUM CHLORIDE 0.9 % WEIGHT BASED INFUSION
3.0000 mL/kg/h | INTRAVENOUS | Status: DC
  Administered 2017-03-18: 3 mL/kg/h via INTRAVENOUS
  Administered 2017-03-18: 1 mL via INTRAVENOUS

## 2017-03-18 MED ORDER — SPIRONOLACTONE 25 MG PO TABS
25.0000 mg | ORAL_TABLET | Freq: Every day | ORAL | Status: DC
  Administered 2017-03-18 – 2017-03-19 (×2): 25 mg via ORAL
  Filled 2017-03-18 (×2): qty 1

## 2017-03-18 MED ORDER — PREDNISONE 20 MG PO TABS: 60.0000 mg | ORAL_TABLET | Freq: Once | ORAL | Status: DC

## 2017-03-18 MED ORDER — LIDOCAINE HCL (PF) 1 % IJ SOLN
INTRAMUSCULAR | Status: DC | PRN
  Administered 2017-03-18: 20 mL

## 2017-03-18 MED ORDER — LOSARTAN POTASSIUM 50 MG PO TABS
100.0000 mg | ORAL_TABLET | Freq: Every day | ORAL | Status: DC
  Administered 2017-03-18 – 2017-03-19 (×2): 100 mg via ORAL
  Filled 2017-03-18 (×2): qty 2

## 2017-03-18 MED ORDER — SODIUM CHLORIDE 0.9 % IV SOLN: 250.0000 mL | INTRAVENOUS | Status: DC | PRN

## 2017-03-18 MED ORDER — IOPAMIDOL (ISOVUE-370) INJECTION 76%
INTRAVENOUS | Status: AC
  Filled 2017-03-18: qty 100

## 2017-03-18 MED ORDER — FAMOTIDINE IN NACL 20-0.9 MG/50ML-% IV SOLN: 20.0000 mg | INTRAVENOUS | Status: DC

## 2017-03-18 MED ORDER — DIPHENHYDRAMINE HCL 50 MG/ML IJ SOLN
INTRAMUSCULAR | Status: AC
  Administered 2017-03-18: 25 mg
  Filled 2017-03-18: qty 1

## 2017-03-18 MED ORDER — PREDNISOLONE 5 MG PO TABS
60.0000 mg | ORAL_TABLET | Freq: Once | ORAL | Status: AC
  Administered 2017-03-18: 60 mg via ORAL
  Filled 2017-03-18: qty 12

## 2017-03-18 MED ORDER — MIDAZOLAM HCL 2 MG/2ML IJ SOLN
INTRAMUSCULAR | Status: DC | PRN
  Administered 2017-03-18: 2 mg via INTRAVENOUS
  Administered 2017-03-18 (×2): 1 mg via INTRAVENOUS

## 2017-03-18 MED ORDER — GLIPIZIDE 5 MG PO TABS
10.0000 mg | ORAL_TABLET | Freq: Two times a day (BID) | ORAL | Status: DC
  Administered 2017-03-18 – 2017-03-19 (×2): 10 mg via ORAL
  Filled 2017-03-18: qty 2
  Filled 2017-03-18: qty 1
  Filled 2017-03-18: qty 2

## 2017-03-18 MED ORDER — ACETAMINOPHEN 325 MG PO TABS: 650.0000 mg | ORAL_TABLET | ORAL | Status: DC | PRN

## 2017-03-18 MED ORDER — NITROGLYCERIN 0.4 MG SL SUBL: 0.4000 mg | SUBLINGUAL_TABLET | SUBLINGUAL | Status: DC | PRN

## 2017-03-18 SURGICAL SUPPLY — 22 items
BALLN EUPHORA RX 2.0X12 (BALLOONS) ×2
BALLOON EUPHORA RX 2.0X12 (BALLOONS) IMPLANT
CATH EXPO 5FR FL4 (CATHETERS) ×1 IMPLANT
CATH INFINITI 5FR AL1 (CATHETERS) ×1 IMPLANT
CATH VISTA GUIDE 6FR XBLAD3.5 (CATHETERS) ×1 IMPLANT
DEVICE WIRE ANGIOSEAL 6FR (Vascular Products) ×1 IMPLANT
FEM STOP ARCH (HEMOSTASIS) ×1
GLIDESHEATH SLEND A-KIT 6F 22G (SHEATH) IMPLANT
GUIDEWIRE 3MM J TIP .035 145 (WIRE) ×1 IMPLANT
GUIDEWIRE INQWIRE 1.5J.035X260 (WIRE) IMPLANT
HOVERMATT SINGLE USE (MISCELLANEOUS) ×1 IMPLANT
INQWIRE 1.5J .035X260CM (WIRE)
KIT ENCORE 26 ADVANTAGE (KITS) ×1 IMPLANT
KIT HEART LEFT (KITS) ×2 IMPLANT
PACK CARDIAC CATHETERIZATION (CUSTOM PROCEDURE TRAY) ×2 IMPLANT
SHEATH PINNACLE 5F 10CM (SHEATH) ×1 IMPLANT
SHEATH PINNACLE 6F 10CM (SHEATH) ×1 IMPLANT
STENT XIENCE ALPINE RX 2.5X18 (Permanent Stent) ×1 IMPLANT
SYSTEM COMPRESSION FEMOSTOP (HEMOSTASIS) IMPLANT
TRANSDUCER W/STOPCOCK (MISCELLANEOUS) ×2 IMPLANT
TUBING CIL FLEX 10 FLL-RA (TUBING) ×2 IMPLANT
WIRE ASAHI PROWATER 180CM (WIRE) ×1 IMPLANT

## 2017-03-18 NOTE — Progress Notes (Signed)
Lab in to draw troponin

## 2017-03-18 NOTE — Progress Notes (Signed)
perclose failed. Pt bleeding from femoral site; pressure held x 1 hour. Unable to stop oozing/ bleeding. femostop applied at 2040. Pt tolerating well; bleeding stopped.

## 2017-03-18 NOTE — Care Management Note (Signed)
Case Management Note  Patient Details  Name: Alicia [name] MRN: [id number] Date of Birth: 07-22-1946  Subjective/Objective:     s/p coronary stent intervention, will be on plavix.                Action/Plan: NCM will cont to follow for dc needs.  Expected Discharge Date:                  Expected Discharge Plan:  Home/Self Care  In-House Referral:     Discharge planning Services  CM Consult  Post Acute Care Choice:    Choice offered to:     DME Arranged:    DME Agency:     HH Arranged:    HH Agency:     Status of Service:  In process, will continue to follow  If discussed at Long Length of Stay Meetings, dates discussed:    Additional Comments:  Zenon Mayo, RN 03/18/2017, 3:51 PM

## 2017-03-18 NOTE — H&P (Signed)
   History and Physical Interval Note:  03/18/2017 1:26 PM  Alicia [name]  has presented today for surgery, with the diagnosis of excertional angina  The various methods of treatment have been discussed with the patient and family. After consideration of risks, benefits and other options for treatment, the patient has consented to   Procedure(s): Left Heart Cath and Coronary Angiography (N/A) with Possible Percutaneous Coronary Intervention  as a surgical intervention .  The patient's history has been reviewed, patient examined, no change in status, stable for surgery.  I have reviewed the patient's chart and labs.  Questions were answered to the patient's satisfaction.     Cath Lab Visit (complete for each Cath Lab visit)  Clinical Evaluation Leading to the Procedure:   ACS: No.   Non-ACS:    Anginal Classification: CCS IV  Anti-ischemic medical therapy: Minimal Therapy (1 class of medications)  Non-Invasive Test Results: No non-invasive testing performed  Prior CABG: No previous CABG    Alicia [name]

## 2017-03-18 NOTE — Research (Signed)
Patient enrolled in the OPTIMIZE study with Research.  Troponin ordered for 03/18/2017 @ 2100 and 03/19/17 @ 0500 am per protocol. EKG ordered 03/19/17 @ 0500 per protocol also.   If any questions please call research team at [phone]  Patton Salles RN, BSN

## 2017-03-18 NOTE — Research (Addendum)
OPTIMIZE Informed Consent   Subject Name: Alicia [name]  Subject met inclusion and exclusion criteria.  The informed consent form, study requirements and expectations were reviewed with the subject and questions and concerns were addressed prior to the signing of the consent form.  The subject verbalized understanding of the trail requirements.  The subject agreed to participate in the OPTIMIZE trial and signed the informed consent.  The informed consent was obtained prior to performance of any protocol-specific procedures for the subject.  A copy of the signed informed consent was given to the subject and a copy was placed in the subject's medical record.   Philemon Kingdom D 03/18/2017, 11:24 AM

## 2017-03-19 ENCOUNTER — Encounter (HOSPITAL_COMMUNITY): Payer: Self-pay | Admitting: Cardiology

## 2017-03-19 ENCOUNTER — Other Ambulatory Visit: Payer: Self-pay | Admitting: Internal Medicine

## 2017-03-19 DIAGNOSIS — E1122 Type 2 diabetes mellitus with diabetic chronic kidney disease: Secondary | ICD-10-CM | POA: Diagnosis not present

## 2017-03-19 DIAGNOSIS — I25110 Atherosclerotic heart disease of native coronary artery with unstable angina pectoris: Secondary | ICD-10-CM | POA: Diagnosis not present

## 2017-03-19 DIAGNOSIS — I129 Hypertensive chronic kidney disease with stage 1 through stage 4 chronic kidney disease, or unspecified chronic kidney disease: Secondary | ICD-10-CM | POA: Diagnosis not present

## 2017-03-19 DIAGNOSIS — I10 Essential (primary) hypertension: Secondary | ICD-10-CM

## 2017-03-19 DIAGNOSIS — E785 Hyperlipidemia, unspecified: Secondary | ICD-10-CM | POA: Diagnosis not present

## 2017-03-19 LAB — BASIC METABOLIC PANEL
Anion gap: 7 (ref 5–15)
BUN: 22 mg/dL — AB (ref 6–20)
CHLORIDE: 108 mmol/L (ref 101–111)
CO2: 23 mmol/L (ref 22–32)
CREATININE: 1.21 mg/dL — AB (ref 0.44–1.00)
Calcium: 8.1 mg/dL — ABNORMAL LOW (ref 8.9–10.3)
GFR calc Af Amer: 52 mL/min — ABNORMAL LOW (ref >60)
GFR calc non Af Amer: 45 mL/min — ABNORMAL LOW (ref >60)
GLUCOSE: 180 mg/dL — AB (ref 65–99)
Potassium: 4 mmol/L (ref 3.5–5.1)
Sodium: 138 mmol/L (ref 135–145)

## 2017-03-19 LAB — CBC
HCT: 32 % — ABNORMAL LOW (ref 36.0–46.0)
Hemoglobin: 9.9 g/dL — ABNORMAL LOW (ref 12.0–15.0)
MCH: 21.9 pg — AB (ref 26.0–34.0)
MCHC: 30.9 g/dL (ref 30.0–36.0)
MCV: 70.6 fL — AB (ref 78.0–100.0)
PLATELETS: 201 10*3/uL (ref 150–400)
RBC: 4.53 MIL/uL (ref 3.87–5.11)
RDW: 15.5 % (ref 11.5–15.5)
WBC: 10 10*3/uL (ref 4.0–10.5)

## 2017-03-19 LAB — GLUCOSE, CAPILLARY: Glucose-Capillary: 174 mg/dL — ABNORMAL HIGH (ref 65–99)

## 2017-03-19 LAB — TROPONIN I: Troponin I: 0.1 ng/mL (ref <0.03)

## 2017-03-19 MED ORDER — HYDRALAZINE HCL 20 MG/ML IJ SOLN
10.0000 mg | INTRAMUSCULAR | Status: AC | PRN
  Administered 2017-03-19: 10 mg via INTRAVENOUS
  Filled 2017-03-19: qty 1

## 2017-03-19 MED ORDER — METFORMIN HCL 1000 MG PO TABS: 1000.0000 mg | ORAL_TABLET | Freq: Two times a day (BID) | ORAL | 1 refills | Status: DC

## 2017-03-19 MED ORDER — ACETAMINOPHEN 325 MG PO TABS: 650.0000 mg | ORAL_TABLET | ORAL | Status: DC | PRN

## 2017-03-19 MED FILL — Verapamil HCl IV Soln 2.5 MG/ML: INTRAVENOUS | Qty: 2 | Status: AC

## 2017-03-19 NOTE — Progress Notes (Signed)
On rounds patient was sitting on the side of the bed; femostop still in place (deflated).  Reinforced education that bedrest will now be until 0500. Helped pt back to bed. Right groin is soft and no bleeding. Level 0. Bed alarm on.

## 2017-03-19 NOTE — Discharge Instructions (Signed)
Coronary Angiogram With Stent, Care After This sheet gives you information about how to care for yourself after your procedure. Your health care provider may also give you more specific instructions. If you have problems or questions, contact your health care provider. What can I expect after the procedure? After your procedure, it is common to have:  Bruising in the area where a small, thin tube (catheter) was inserted. This usually fades within 1-2 weeks.  Blood collecting in the tissue (hematoma) that may be painful to the touch. It should usually decrease in size and tenderness within 1-2 weeks. Follow these instructions at home: Insertion area care   Do not take baths, swim, or use a hot tub until your health care provider approves.  You may shower 24-48 hours after the procedure or as directed by your health care provider.  Follow instructions from your health care provider about how to take care of your incision. Make sure you:  Wash your hands with soap and water before you change your bandage (dressing). If soap and water are not available, use hand sanitizer.  Change your dressing as told by your health care provider.  Leave stitches (sutures), skin glue, or adhesive strips in place. These skin closures may need to stay in place for 2 weeks or longer. If adhesive strip edges start to loosen and curl up, you may trim the loose edges. Do not remove adhesive strips completely unless your health care provider tells you to do that.  Remove the bandage (dressing) and gently wash the catheter insertion site with plain soap and water.  Pat the area dry with a clean towel. Do not rub the area, because that may cause bleeding.  Do not apply powder or lotion to the incision area.  Check your incision area every day for signs of infection. Check for:  More redness, swelling, or pain.  More fluid or blood.  Warmth.  Pus or a bad smell. Activity   Do not drive for 24 hours if you  were given a medicine to help you relax (sedative).  Do not lift anything that is heavier than 10 lb (4.5 kg) for 5 days after your procedure or as directed by your health care provider.  Ask your health care provider when it is okay for you:  To return to work or school.  To resume usual physical activities or sports.  To resume sexual activity. Eating and drinking   Eat a heart-healthy diet. This should include plenty of fresh fruits and vegetables.  Avoid the following types of food:  Food that is high in salt.  Canned or highly processed food.  Food that is high in saturated fat or sugar.  Fried food.  Limit alcohol intake to no more than 1 drink a day for non-pregnant women and 2 drinks a day for men. One drink equals 12 oz of beer, 5 oz of wine, or 1 oz of hard liquor. Lifestyle   Do not use any products that contain nicotine or tobacco, such as cigarettes and e-cigarettes. If you need help quitting, ask your health care provider.  Take steps to manage and control your weight.  Get regular exercise.  Manage your blood pressure.  Manage other health problems, such as diabetes. General instructions   Take over-the-counter and prescription medicines only as told by your health care provider. Blood thinners may be prescribed after your procedure to improve blood flow through the stent.  If you need an MRI after your heart stent  has been placed, be sure to tell the health care provider who orders the MRI that you have a heart stent.  Keep all follow-up visits as directed by your health care provider. This is important. Contact a health care provider if:  You have a fever.  You have chills.  You have increased bleeding from the catheter insertion area. Hold pressure on the area. Get help right away if:  You develop chest pain or shortness of breath.  You feel faint or you pass out.  You have unusual pain at the catheter insertion area.  You have redness,  warmth, or swelling at the catheter insertion area.  You have drainage (other than a small amount of blood on the dressing) from the catheter insertion area.  The catheter insertion area is bleeding, and the bleeding does not stop after 30 minutes of holding steady pressure on the area.  You develop bleeding from any other place, such as from your rectum. There may be bright red blood in your urine or stool, or it may appear as black, tarry stool. This information is not intended to replace advice given to you by your health care provider. Make sure you discuss any questions you have with your health care provider. Document Released: 05/25/2005 Document Revised: 08/02/2016 Document Reviewed: 08/02/2016 Elsevier Interactive Patient Education  2017 [employer].

## 2017-03-19 NOTE — Care Management Note (Signed)
Case Management Note  Patient Details  Name: Alicia [name] MRN: [id number] Date of Birth: 03/08/1946  Subjective/Objective:    From home, pta indep, s/p coronary stent intervention, will be on plavix.  for dc today, no needs.                            Action/Plan:   Expected Discharge Date:  03/19/17               Expected Discharge Plan:  Home/Self Care  In-House Referral:     Discharge planning Services  CM Consult  Post Acute Care Choice:    Choice offered to:     DME Arranged:    DME Agency:     HH Arranged:    HH Agency:     Status of Service:  Completed, signed off  If discussed at [employer] of Stay Meetings, dates discussed:    Additional Comments:  Zenon Mayo, RN 03/19/2017, 9:14 AM

## 2017-03-19 NOTE — Progress Notes (Signed)
     SUBJECTIVE: No chest pain or dyspnea.   Tele: sinus  BP (!) 93/48   Pulse 86   Temp 98.1 F (36.7 C) (Oral)   Resp (!) 21   Ht 5\' 5"  (1.651 m)   Wt (!) 306 lb (138.8 kg)   SpO2 100%   BMI 50.92 kg/m   Intake/Output Summary (Last 24 hours) at 03/19/17 1102 Last data filed at 03/18/17 2207  Gross per 24 hour  Intake            612.5 ml  Output              650 ml  Net            -37.5 ml    PHYSICAL EXAM General: Well developed, well nourished, in no acute distress. Alert and oriented x 3.  Psych:  Good affect, responds appropriately Neck: No JVD. No masses noted.  Lungs: Clear bilaterally with no wheezes or rhonci noted.  Heart: RRR with no murmurs noted. Abdomen: Bowel sounds are present. Soft, non-tender.  Extremities: No lower extremity edema. No groin hematoma  LABS: Basic Metabolic Panel:  Recent Labs  03/19/17 0310  NA 138  K 4.0  CL 108  CO2 23  GLUCOSE 180*  BUN 22*  CREATININE 1.21*  CALCIUM 8.1*   CBC:  Recent Labs  03/19/17 0310  WBC 10.0  HGB 9.9*  HCT 32.0*  MCV 70.6*  PLT 201   Cardiac Enzymes:  Current Meds: . aspirin EC  81 mg Oral Daily  . atorvastatin  80 mg Oral QPM  . carvedilol  25 mg Oral BID  . clopidogrel  75 mg Oral Daily  . doxazosin  4 mg Oral QHS  . ferrous sulfate  325 mg Oral TID WC  . glipiZIDE  10 mg Oral BID AC  . insulin aspart  0-15 Units Subcutaneous TID WC  . insulin aspart  0-5 Units Subcutaneous QHS  . insulin aspart  5 Units Subcutaneous Once  . isosorbide mononitrate  30 mg Oral Daily  . losartan  100 mg Oral Daily  . sodium chloride flush  3 mL Intravenous Q12H  . spironolactone  25 mg Oral Daily     ASSESSMENT AND PLAN:  1. CAD/Unstable angina: Pt admitted following PCI/stenting of the Diagonal branch by Dr. Ellyn Hack with a drug eluting stent. She is on ASA and Plavix. Continue DAPT for at least one year. Continue statin, beta blocker, Imdur, ARB. Mild elevation of troponin post PCI which  is clinically insignificant.   Discharge home today. Follow up with Dr. Harrington Challenger or office APP in 1 week.   Lauree Chandler  5/1/20187:28 AM

## 2017-03-19 NOTE — Progress Notes (Signed)
CARDIAC REHAB PHASE I   PRE:  Rate/Rhythm: 101 ST  BP:  Supine: 144/47  Sitting:   Standing:    SaO2:   MODE:  Ambulation: 400 ft   POST:  Rate/Rhythm: 120 ST  BP:  Supine:   Sitting: 148/74  Standing:    SaO2: 98%RA 0800-0910 Pt walked 400 ft stopping a couple of times to rest due to SOB. No CP. Education completed with pt who voiced understanding. Stressed importance of plavix with stent. Pt's A1C has gone up from 6 when I saw her in 2016 to 14.1 now. We discussed what had happened to increase. Stated she has just recently gotten back on her victoza. Also stated has not been walking much as she has been writing a lot. Also pt stated her brother moved in to help her take care of disabled sister and she has had to change her cooking.  Encouraged her to get back to walking and cooking healthier. Pt attended CRP 2 before and is looking forward to returning. Will refer to [place] CRP 2.  Reviewed NTG use, carb counting, ex ed also.   Graylon Good, RN BSN  03/19/2017 9:03 AM

## 2017-03-19 NOTE — Discharge Summary (Signed)
Discharge Summary    Patient ID: Alicia [name],  MRN: [id number], DOB/AGE: 1946-08-16 71 y.o.  Admit date: 03/18/2017 Discharge date: 03/19/2017  Primary Care Provider: Webb Silversmith Primary Cardiologist: Dr Harrington Challenger  Discharge Diagnoses    Principal Problem:   Progressive angina [facility]) Active Problems:   DM (diabetes mellitus), type 2, uncontrolled ([place])   Hyperlipidemia   CAD S/P percutaneous coronary angioplasty   Essential hypertension   Morbid obesity (HCC)   CKD (chronic kidney disease) stage 3, GFR 30-59 ml/min   Allergies Allergies  Allergen Reactions  . Iodinated Diagnostic Agents Hives  . Sulfa Antibiotics Hives    Diagnostic Studies/Procedures    Cath/PCI 03/18/17 _____________   History of Present Illness     71 y/o female seen in the office with chest pain- admitted for elective cath  Hospital Course     71 y.o.morbidly obese AA female with a hx of CAD s/p LAD DES in Dec 2016, HTN, HL, and diabetes.  Last seen by Dr. Harrington Challenger 9/17. She was seen by Richardson Dopp in the office 03/15/17 for evaluation of chest pain. She described progressively worsening chest pressure over the past 4-5 weeks. She was placed on Imdur and set up for an OP cath 03/18/17. She has a history of IV contrast allergy and she was prescribed Prednisone pre cath. On 03/18/17 she was taken to the cath lab by Dr Ellyn Hack. Cath revealed a proximal 90% Dx1 lesion which had progressed from prio cath. This was treated with a DES. She also had a 45% mid LAD lesion that was treated with POBA. The prior RCA stent was patent. No LVG was done to limit contrast, her LVF was normal in Dec 2016 by echo. Post PCI she did well. Dr Julianne Handler feels she can be discharged 03/19/17. F/U has been arranged, she should have a BMP at follow up. F/U B/P was 144/70. Her SCr was 1.2 at discharge.  _____________  Discharge Vitals Blood pressure (!) 93/48, pulse 86, temperature 98.4 F (36.9 C), temperature source Oral, resp. rate  (!) 21, height 5\' 5"  (1.651 m), weight (!) 306 lb (138.8 kg), SpO2 97 %.  Filed Weights   03/18/17 0829 03/19/17 0510  Weight: (!) 306 lb (138.8 kg) (!) 306 lb (138.8 kg)    Labs & Radiologic Studies    CBC  Recent Labs  03/19/17 0310  WBC 10.0  HGB 9.9*  HCT 32.0*  MCV 70.6*  PLT 644   Basic Metabolic Panel  Recent Labs  03/19/17 0310  NA 138  K 4.0  CL 108  CO2 23  GLUCOSE 180*  BUN 22*  CREATININE 1.21*  CALCIUM 8.1*   Liver Function Tests No results for input(s): AST, ALT, ALKPHOS, BILITOT, PROT, ALBUMIN in the last 72 hours. No results for input(s): LIPASE, AMYLASE in the last 72 hours. Cardiac Enzymes  Recent Labs  03/18/17 1421 03/18/17 2104 03/19/17 0310  TROPONINI <0.03 0.04* 0.10*   _____________  No results found. Disposition   Pt is being discharged home today in good condition.  Follow-up Plans & Appointments    Follow-up Information    Richardson Dopp, PA-C Follow up on 04/08/2017.   Specialties:  Cardiology, Physician Assistant Why:  12:15 Contact information: [address] [place] [address] [phone]            Discharge Medications   Current Discharge Medication List    START taking these medications   Details  acetaminophen (  TYLENOL) 325 MG tablet Take 2 tablets (650 mg total) by mouth every 4 (four) hours as needed for headache or mild pain.      CONTINUE these medications which have CHANGED   Details  metFORMIN (GLUCOPHAGE) 1000 MG tablet Take 1 tablet (1,000 mg total) by mouth 2 (two) times daily with a meal. Qty: 180 tablet, Refills: 1      CONTINUE these medications which have NOT CHANGED   Details  aspirin 81 MG tablet Take 81 mg by mouth daily.    atorvastatin (LIPITOR) 80 MG tablet TAKE 1 TABLET BY MOUTH EVERY EVENING Qty: 90 tablet, Refills: 1    carvedilol (COREG) 25 MG tablet TAKE 1 TABLET BY MOUTH TWICE A DAY Qty: 60 tablet, Refills: 5    clopidogrel (PLAVIX) 75 MG tablet  TAKE 1 TABLET (75 MG TOTAL) BY MOUTH DAILY. Qty: 90 tablet, Refills: 2    DiphenhydrAMINE HCl, Sleep, (UNISOM SLEEPGELS PO) Take 1 tablet by mouth at bedtime as needed (SLEEP).    ferrous sulfate 325 (65 FE) MG tablet TAKE 1 TABLET (325 MG TOTAL) BY MOUTH 3 (THREE) TIMES DAILY WITH MEALS. Qty: 90 tablet, Refills: 3    furosemide (LASIX) 20 MG tablet TAKE 1 TABLET (20 MG TOTAL) BY MOUTH DAILY. Qty: 90 tablet, Refills: 1   Associated Diagnoses: CAD S/P percutaneous coronary angioplasty; Hypertensive heart disease without heart failure; Hyperlipidemia; Elevated serum creatinine; Generalized edema    glipiZIDE (GLUCOTROL) 10 MG tablet TAKE 1 TABLET (10 MG TOTAL) BY MOUTH 2 (TWO) TIMES DAILY BEFORE A MEAL. Qty: 180 tablet, Refills: 0   Associated Diagnoses: Type 2 diabetes mellitus with hyperglycemia, without long-term current use of insulin (HCC)    isosorbide mononitrate (IMDUR) 30 MG 24 hr tablet Take 1 tablet (30 mg total) by mouth daily. Qty: 90 tablet, Refills: 3   Associated Diagnoses: Coronary artery disease involving native coronary artery of native heart with other form of angina pectoris ([place]); Essential hypertension    liraglutide 18 MG/3ML SOPN Inject 0.2 mLs (1.2 mg total) into the skin daily Qty: 6 pen, Refills: 1    losartan (COZAAR) 100 MG tablet TAKE 1 TABLET (100 MG TOTAL) BY MOUTH DAILY. Qty: 90 tablet, Refills: 0   Associated Diagnoses: Essential hypertension    Multiple Vitamins-Minerals (MULTIVITAMIN WITH MINERALS) tablet Take 1 tablet by mouth daily.    spironolactone (ALDACTONE) 25 MG tablet TAKE 1 TABLET (25 MG TOTAL) BY MOUTH DAILY. Qty: 90 tablet, Refills: 0    doxazosin (CARDURA) 4 MG tablet TAKE 1 TABLET (4 MG TOTAL) BY MOUTH AT BEDTIME. Qty: 90 tablet, Refills: 0   Associated Diagnoses: Essential hypertension    nitroGLYCERIN (NITROSTAT) 0.4 MG SL tablet Place 1 tablet (0.4 mg total) under the tongue every 5 (five) minutes as needed for chest pain (up to  3 doses). Qty: 25 tablet, Refills: 3      STOP taking these medications     predniSONE (DELTASONE) 20 MG tablet      B-D UF III MINI PEN NEEDLES 31G X 5 MM MISC          Aspirin prescribed at discharge?  Yes High Intensity Statin Prescribed? (Lipitor 40-80mg  or Crestor 20-40mg ): Yes Beta Blocker Prescribed? Yes For EF <40%, was ACEI/ARB Prescribed? Yes ADP Receptor Inhibitor Prescribed? (i.e. Plavix etc.-Includes Medically Managed Patients): Yes For EF <40%, Aldosterone Inhibitor Prescribed? Yes Was EF assessed during THIS hospitalization? No: Renal insuficiency Was Cardiac Rehab II ordered? (Included Medically managed Patients): Yes   Outstanding  Labs/Studies     Duration of Discharge Encounter   Greater than 30 minutes including physician time.  Angelena Form PA 03/19/2017, 8:16 AM

## 2017-03-21 ENCOUNTER — Encounter: Payer: Self-pay | Admitting: *Deleted

## 2017-03-28 ENCOUNTER — Telehealth (HOSPITAL_COMMUNITY): Payer: Self-pay

## 2017-03-28 NOTE — Telephone Encounter (Signed)
Verified UHC insurance benefits through Passport Copay $20.00 No coinsurance or Deductible Out of Pocket $4000.00 pt has met $35.80. Reference [phone]... KJ

## 2017-04-04 ENCOUNTER — Telehealth: Payer: Self-pay | Admitting: *Deleted

## 2017-04-04 NOTE — Telephone Encounter (Signed)
Open in error

## 2017-04-08 ENCOUNTER — Ambulatory Visit (INDEPENDENT_AMBULATORY_CARE_PROVIDER_SITE_OTHER): Payer: Medicare Other | Admitting: Physician Assistant

## 2017-04-08 ENCOUNTER — Encounter: Payer: Self-pay | Admitting: Physician Assistant

## 2017-04-08 VITALS — BP 162/80 | HR 88 | Ht 65.0 in | Wt 307.1 lb

## 2017-04-08 DIAGNOSIS — N183 Chronic kidney disease, stage 3 unspecified: Secondary | ICD-10-CM

## 2017-04-08 DIAGNOSIS — I10 Essential (primary) hypertension: Secondary | ICD-10-CM | POA: Diagnosis not present

## 2017-04-08 DIAGNOSIS — I2510 Atherosclerotic heart disease of native coronary artery without angina pectoris: Secondary | ICD-10-CM

## 2017-04-08 DIAGNOSIS — E7800 Pure hypercholesterolemia, unspecified: Secondary | ICD-10-CM

## 2017-04-08 MED ORDER — AMLODIPINE BESYLATE 5 MG PO TABS: 5.0000 mg | ORAL_TABLET | Freq: Every day | ORAL | 3 refills | Status: DC

## 2017-04-08 NOTE — Progress Notes (Signed)
Cardiology Office Note:    Date:  04/08/2017   ID:  Frederick Klinger, DOB Nov 26, 1945, MRN [id number]  PCP:  Jearld Fenton, NP  Cardiologist:  Dr. [name]    Referring MD: Jearld Fenton, NP   Chief Complaint  Patient presents with  . Hospitalization Follow-up    Status post PCI    History of Present Illness:    Alicia [name] is a 71 y.o. female with a hx of CAD, HTN, HL, diabetes. She underwent cardiac catheterization 12/16 for shortness of breath and chest pain concerning for an several angina. This demonstrated high-grade stenosis in the mid RCA treated with a Synergy DES.  I saw her 03/15/17 with progressive angina.  Cardiac catheterization was arranged.  This demonstrated high grade ostial D1 stenosis that was tx with a DES.  She had minimally elevated Troponin levels post PCI.  This was not felt to be significant.  Otherwise her post PCI course was uneventful.    Alicia [name] returns for post hospitalization follow up.  She is here alone.  She denies any further chest pain since last seen. She has been more sedentary and feels she is more short of breath with activity.  However this is improving.  She denies syncope, orthopnea, PND, significant edema.  She has some dizziness with standing abruptly. She denies any bleeding issues.    Prior CV studies:   The following studies were reviewed today:  LHC 03/18/17 LAD mid 53; D1 ostial 90 LCx irregs RCA mid stent patent PCI: Optimize Study DES 2.5 x 15 mm to D1  Echo 10/21/15 Moderate concentric LVH, EF 60-65, normal wall motion, MAC, moderate to severe LAE   LHC 10/21/15 LAD mid 25, D1 60 LCx irregularities RCA mid 75 PCI: 4 x 16 mm Synergy DES to the mid RCA   Echo 10/20/15 Moderate LVH, EF 60-65, normal wall motion, grade 1 diastolic dysfunction, aortic sclerosis without stenosis, MAC, severe LAE, normal RVSF, moderate TR, PASP 38  Past Medical History:  Diagnosis Date  . Anemia   . Arthritis    "right knee" (10/21/2015)  .  CAD (coronary artery disease)    a. Cath 10/21/15: s/p DES to RCA: moderate diffuse stenosis of mod diagonal, diffuse irregularity LCx and LAD. 2D echo 10/21/15: mod LVH, EF 60-65%, no RWMA, calcified MV, mod-severe LAE. 03/18/17 PCI with DES--> 1 diag  . Cataract   . Essential hypertension   . Family history of adverse reaction to anesthesia    "oldest sister, [name], related to brain formation at back of head; when they put her to sleep it's hard to wake her up"  . GERD (gastroesophageal reflux disease)   . History of blood transfusion    "related to ruptured tubal pregnancy"  . Hyperlipidemia   . Hypertensive heart disease   . Morbid obesity ([place])   . Type II diabetes mellitus ([place])     Past Surgical History:  Procedure Laterality Date  . ABDOMINAL HYSTERECTOMY  1988  . APPENDECTOMY  1985  . [place] SURGERY  1984  . CARDIAC CATHETERIZATION N/A 10/21/2015   Procedure: Right/Left Heart Cath and Coronary Angiography;  Surgeon: [name], MD;  Location: [place] [facility];  Service: Cardiovascular;  Laterality: N/A;  . CHOLECYSTECTOMY OPEN  1984  . CORONARY STENT INTERVENTION N/A 03/18/2017   Procedure: Coronary Stent Intervention;  Surgeon: [name], MD;  Location: [place] [facility];  Service: Cardiovascular;  Laterality: N/A;  . CORONARY  STENT PLACEMENT  03/18/2017   A OPTIMIZE STUDY Drug Eluting Stent (2.5 mm 18 mm - post-dilated to 2.7 mm) was successfully placed  . ECTOPIC PREGNANCY SURGERY    . LEFT HEART CATH AND CORONARY ANGIOGRAPHY N/A 03/18/2017   Procedure: Left Heart Cath and Coronary Angiography;  Surgeon: [name], MD;  Location: [place] [facility];  Service: Cardiovascular;  Laterality: N/A;  . REDUCTION MAMMAPLASTY Bilateral ~ 1976  . TONSILLECTOMY      Current Medications: Current Meds  Medication Sig  . acetaminophen (TYLENOL) 325 MG tablet Take 2 tablets (650 mg total) by mouth every 4 (four) hours as needed for headache or mild pain.  [name] aspirin  81 MG tablet Take 81 mg by mouth daily.  [name] atorvastatin (LIPITOR) 80 MG tablet TAKE 1 TABLET BY MOUTH EVERY EVENING  . carvedilol (COREG) 25 MG tablet TAKE 1 TABLET BY MOUTH TWICE A DAY  . clopidogrel (PLAVIX) 75 MG tablet TAKE 1 TABLET (75 MG TOTAL) BY MOUTH DAILY.  . DiphenhydrAMINE HCl, Sleep, (UNISOM SLEEPGELS PO) Take 1 tablet by mouth at bedtime as needed (SLEEP).  [name] doxazosin (CARDURA) 4 MG tablet TAKE 1 TABLET (4 MG TOTAL) BY MOUTH AT BEDTIME.  . ferrous sulfate 325 (65 FE) MG tablet TAKE 1 TABLET (325 MG TOTAL) BY MOUTH 3 (THREE) TIMES DAILY WITH MEALS.  . furosemide (LASIX) 20 MG tablet TAKE 1 TABLET (20 MG TOTAL) BY MOUTH DAILY.  [name] glipiZIDE (GLUCOTROL) 10 MG tablet TAKE 1 TABLET (10 MG TOTAL) BY MOUTH 2 (TWO) TIMES DAILY BEFORE A MEAL.  . isosorbide mononitrate (IMDUR) 30 MG 24 hr tablet Take 1 tablet (30 mg total) by mouth daily.  [name] liraglutide 18 MG/3ML SOPN Inject 0.2 mLs (1.2 mg total) into the skin daily  . losartan (COZAAR) 100 MG tablet TAKE 1 TABLET (100 MG TOTAL) BY MOUTH DAILY.  . metFORMIN (GLUCOPHAGE) 1000 MG tablet Take 1 tablet (1,000 mg total) by mouth 2 (two) times daily with a meal.  . Multiple Vitamins-Minerals (MULTIVITAMIN WITH MINERALS) tablet Take 1 tablet by mouth daily.  . nitroGLYCERIN (NITROSTAT) 0.4 MG SL tablet Place 1 tablet (0.4 mg total) under the tongue every 5 (five) minutes as needed for chest pain (up to 3 doses).  [name] spironolactone (ALDACTONE) 25 MG tablet TAKE 1 TABLET (25 MG TOTAL) BY MOUTH DAILY.     Allergies:   Iodinated diagnostic agents and Sulfa antibiotics   Social History   Social History  . Marital status: Divorced    Spouse name: N/A  . Number of children: 3  . Years of education: doctorate   Occupational History  . Teacher    Social History Main Topics  . Smoking status: Never Smoker  . Smokeless tobacco: Never Used  . Alcohol use No  . Drug use: No  . Sexual activity: Not Currently   Other Topics Concern  . None    Social History Narrative   Caffeine use-yes   Regular exercise-no     Family Hx: The patient's family history includes Diabetes in her mother, sister, sister, and sister; Heart attack in her father and mother; Heart disease in her mother; Hyperlipidemia in her father and mother; Hypertension in her father and mother; Stomach cancer in her sister; Stroke in her father.  ROS:   Please see the history of present illness.    Review of Systems  Cardiovascular: Positive for dyspnea on exertion.  Neurological: Positive for dizziness.   All other systems reviewed and are  negative.   EKGs/Labs/Other Test Reviewed:    EKG:  EKG is  ordered today.  The ekg ordered today demonstrates  NSR, HR 88, normal axis, TWI in 3, aVF, V3-6, QTc 442 ms, no change from prior tracing  Recent Labs: 08/10/2016: Brain Natriuretic Peptide 79.0; TSH 1.83 11/15/2016: ALT 17 03/19/2017: BUN 22; Creatinine, Ser 1.21; Hemoglobin 9.9; Platelets 201; Potassium 4.0; Sodium 138   Recent Lipid Panel    Component Value Date/Time   CHOL 168 02/26/2017 1329   TRIG 122.0 02/26/2017 1329   HDL 43.30 02/26/2017 1329   CHOLHDL 4 02/26/2017 1329   VLDL 24.4 02/26/2017 1329   LDLCALC 101 (H) 02/26/2017 1329     Physical Exam:    VS:  BP (!) 162/80   Pulse 88   Ht 5' 5"  (1.651 m)   Wt (!) 307 lb 1.9 oz (139.3 kg)   BMI 51.11 kg/m     Wt Readings from Last 3 Encounters:  04/08/17 (!) 307 lb 1.9 oz (139.3 kg)  03/19/17 (!) 306 lb (138.8 kg)  03/15/17 (!) 308 lb 12.8 oz (140.1 kg)     Physical Exam  Constitutional: She is oriented to person, place, and time. She appears well-developed and well-nourished. No distress.  HENT:  Head: Normocephalic and atraumatic.  Eyes: No scleral icterus.  Neck: Normal range of motion. No JVD present.  Cardiovascular: Normal rate, regular rhythm, S1 normal, S2 normal and normal heart sounds.   No murmur heard. Pulmonary/Chest: Breath sounds normal. She has no wheezes. She has  no rhonchi. She has no rales.  Abdominal: Soft. There is no tenderness.  Musculoskeletal: She exhibits no edema or deformity (exam difficult but no obvious hematoma or bruit over R femoral arteriotomy site).  Neurological: She is alert and oriented to person, place, and time.  Skin: Skin is warm and dry.  Psychiatric: She has a normal mood and affect.    ASSESSMENT:    1. Coronary artery disease involving native coronary artery of native heart without angina pectoris   2. Essential hypertension   3. Pure hypercholesterolemia   4. CKD (chronic kidney disease) stage 3, GFR 30-59 ml/min    PLAN:    In order of problems listed above:  1. Coronary artery disease involving native coronary artery of native heart without angina pectoris -  Status post prior DES to the RCA in 2016. She recently presented with progressive angina and cardiac catheterization demonstrated high-grade disease in the first diagonal. This was treated with a DES. She denies recurrent anginal continue aspirin, carvedilol, atorvastatin, nitrates. Refer to cardiac rehabilitation.  2. Essential hypertension - Blood pressure uncontrolled. Continue carvedilol, doxazosin, furosemide, isosorbide, losartan. Add amlodipine 5 mg daily for better blood pressure control.  3. Pure hypercholesterolemia - Continue statin.  4. CKD (chronic kidney disease) stage 3, GFR 30-59 ml/min -  Repeat BMET today to evaluate renal function and potassium after recent cardiac catheterization.  Dispo:  Return in about 3 months (around 07/09/2017) for Routine Follow Up, w/ Dr. Harrington Challenger.   Medication Adjustments/Labs and Tests Ordered: Current medicines are reviewed at length with the patient today.  Concerns regarding medicines are outlined above.  Orders/Tests:  Orders Placed This Encounter  Procedures  . Basic Metabolic Panel (BMET)  . AMB referral to cardiac rehabilitation  . EKG 12-Lead   Medication changes: Meds ordered this encounter   Medications  . amLODipine (NORVASC) 5 MG tablet    Sig: Take 1 tablet (5 mg total) by mouth daily.  Dispense:  90 tablet    Refill:  3   Signed, Richardson Dopp, PA-C  04/08/2017 1:25 PM    [facility] [address], [place], [place]  [address] Phone: [phone]; Fax: [phone]

## 2017-04-08 NOTE — Patient Instructions (Addendum)
Medication Instructions:  1. START AMLODIPINE 5 MG DAILY; RX HAS BEEN SENT IN  Labwork: 1. TODAY BMET  Testing/Procedures: NONE ORDERED  Follow-Up: DR. Harrington Challenger ON 07/11/17 @ 2:40   Any Other Special Instructions Will Be Listed Below (If Applicable). CARDIAC REHAB AT [facility] ; THEY WILL CALL YOU WITH AN APPT     If you need a refill on your cardiac medications before your next appointment, please call your pharmacy.

## 2017-04-08 NOTE — Progress Notes (Signed)
Correction 04/08/2017: HPI from 03/15/17 should read "high grade stenosis in the mid RCA treated with Synergy DES." Richardson Dopp, PA-C    04/08/2017 1:27 PM

## 2017-04-09 ENCOUNTER — Telehealth: Payer: Self-pay | Admitting: *Deleted

## 2017-04-09 LAB — BASIC METABOLIC PANEL
BUN/Creatinine Ratio: 13 (ref 12–28)
BUN: 19 mg/dL (ref 8–27)
CO2: 23 mmol/L (ref 18–29)
Calcium: 9.2 mg/dL (ref 8.7–10.3)
Chloride: 105 mmol/L (ref 96–106)
Creatinine, Ser: 1.46 mg/dL — ABNORMAL HIGH (ref 0.57–1.00)
GFR calc Af Amer: 42 mL/min/{1.73_m2} — ABNORMAL LOW (ref >59)
GFR calc non Af Amer: 36 mL/min/{1.73_m2} — ABNORMAL LOW (ref >59)
GLUCOSE: 152 mg/dL — AB (ref 65–99)
POTASSIUM: 4.5 mmol/L (ref 3.5–5.2)
Sodium: 142 mmol/L (ref 134–144)

## 2017-04-09 NOTE — Telephone Encounter (Signed)
-----   Message from Liliane Shi, [place] sent at 04/09/2017  1:13 PM EDT ----- Please call the patient. Kidney function is stable. Glucose is elevated.  All other parameters are within acceptable limits and no further intervention or testing required. Continue with current treatment plan. Richardson Dopp, PA-C   04/09/2017 1:13 PM

## 2017-04-09 NOTE — Telephone Encounter (Signed)
Tried to reach pt to go over lab results though no answer.

## 2017-04-10 ENCOUNTER — Telehealth: Payer: Self-pay | Admitting: *Deleted

## 2017-04-10 NOTE — Telephone Encounter (Signed)
Pt has been notified of lab results and findings by phone with verbal understanding. Pt thanked me for the call today.

## 2017-04-10 NOTE — Telephone Encounter (Signed)
-----   Message from Liliane Shi, [place] sent at 04/09/2017  1:13 PM EDT ----- Please call the patient. Kidney function is stable. Glucose is elevated.  All other parameters are within acceptable limits and no further intervention or testing required. Continue with current treatment plan. Richardson Dopp, PA-C   04/09/2017 1:13 PM

## 2017-04-10 NOTE — Telephone Encounter (Signed)
30 day follow up for OPTIMIZE study.  Pt is doing well, no complaints of chest pain.  She is taking her ASA 81 as directed. Medications was added due to hypertension at office visit with Richardson Dopp, PA.

## 2017-04-16 ENCOUNTER — Telehealth (HOSPITAL_COMMUNITY): Payer: Self-pay | Admitting: Pharmacist

## 2017-04-16 ENCOUNTER — Other Ambulatory Visit: Payer: Self-pay | Admitting: *Deleted

## 2017-04-16 MED ORDER — CARVEDILOL 25 MG PO TABS: 25.0000 mg | ORAL_TABLET | Freq: Two times a day (BID) | ORAL | 3 refills | Status: DC

## 2017-04-16 NOTE — Telephone Encounter (Signed)
Cardiac Rehab Medication Review by a Pharmacist  Does the patient  feel that his/her medications are working for him/her?  yes  Has the patient been experiencing any side effects to the medications prescribed?  no  Does the patient measure his/her own blood pressure or blood glucose at home?  yes, measures BP and blood sugars; added amlodipine at last visit and her last home BP was ~142/82  Does the patient have any problems obtaining medications due to transportation or finances?   no  Understanding of regimen: good Understanding of indications: good Potential of compliance: good  Pharmacist comments: Patient is a 71 yo female who was called on 5/29 for cardiac rehab medication history. She appeared to have a good understanding of her medication regimen and is not having any issues at this time. She recently started on amlodipine for elevated blood pressure, and stated that her blood pressure has improved and she has not had any dizziness or ankle swelling. She does measure her blood pressure and blood glucose at home.   Demetrius Charity, PharmD Acute Care Pharmacy Resident  Pager: [phone] 04/16/2017

## 2017-04-19 ENCOUNTER — Ambulatory Visit: Payer: Medicare Other | Admitting: Internal Medicine

## 2017-04-22 ENCOUNTER — Telehealth (HOSPITAL_COMMUNITY): Payer: Self-pay

## 2017-04-22 NOTE — Telephone Encounter (Signed)
Updated insurance information* [employer] $20.00 co-pay, no deductible, out of pocket $4000/$361.99 has been met, no co-insurance, no pre-authorization and no limit to visit. Passport/reference [phone].

## 2017-04-23 ENCOUNTER — Encounter (HOSPITAL_COMMUNITY)
Admission: RE | Admit: 2017-04-23 | Discharge: 2017-04-23 | Disposition: A | Discharge status: Home / Self Care | Payer: Medicare Other | Source: Ambulatory Visit | Attending: Internal Medicine | Admitting: Internal Medicine

## 2017-04-23 ENCOUNTER — Encounter (HOSPITAL_COMMUNITY): Payer: Self-pay

## 2017-04-23 VITALS — BP 116/78 | HR 86 | Ht 65.25 in | Wt 308.6 lb

## 2017-04-23 DIAGNOSIS — Z955 Presence of coronary angioplasty implant and graft: Secondary | ICD-10-CM

## 2017-04-23 DIAGNOSIS — Z48812 Encounter for surgical aftercare following surgery on the circulatory system: Secondary | ICD-10-CM | POA: Insufficient documentation

## 2017-04-23 NOTE — Progress Notes (Signed)
Cardiac Individual Treatment Plan  Patient Details  Name: Alicia [name] MRN: [id number] Date of Birth: [birth year] Referring Provider:     CARDIAC REHAB PHASE II ORIENTATION from 04/23/2017 in [facility]  Referring Provider  [name], MD.      Initial Encounter Date:    CARDIAC REHAB PHASE II ORIENTATION from 04/23/2017 in [facility]  Date  04/23/17  Referring Provider  [name], MD.      Visit Diagnosis: 03/18/17 DES D1  Patient's Home Medications on Admission:  Current Outpatient Prescriptions:  .  acetaminophen (TYLENOL) 325 MG tablet, Take 2 tablets (650 mg total) by mouth every 4 (four) hours as needed for headache or mild pain., Disp: , Rfl:  .  amLODipine (NORVASC) 5 MG tablet, Take 1 tablet (5 mg total) by mouth daily., Disp: 90 tablet, Rfl: 3 .  aspirin 81 MG tablet, Take 81 mg by mouth daily., Disp: , Rfl:  .  atorvastatin (LIPITOR) 80 MG tablet, TAKE 1 TABLET BY MOUTH EVERY EVENING, Disp: 90 tablet, Rfl: 1 .  carvedilol (COREG) 25 MG tablet, Take 1 tablet (25 mg total) by mouth 2 (two) times daily., Disp: 180 tablet, Rfl: 3 .  clopidogrel (PLAVIX) 75 MG tablet, TAKE 1 TABLET (75 MG TOTAL) BY MOUTH DAILY., Disp: 90 tablet, Rfl: 2 .  DiphenhydrAMINE HCl, Sleep, (UNISOM SLEEPGELS PO), Take 1 tablet by mouth at bedtime as needed (SLEEP)., Disp: , Rfl:  .  doxazosin (CARDURA) 4 MG tablet, TAKE 1 TABLET (4 MG TOTAL) BY MOUTH AT BEDTIME., Disp: 90 tablet, Rfl: 0 .  ferrous sulfate 325 (65 FE) MG tablet, TAKE 1 TABLET (325 MG TOTAL) BY MOUTH 3 (THREE) TIMES DAILY WITH MEALS., Disp: 90 tablet, Rfl: 3 .  furosemide (LASIX) 20 MG tablet, TAKE 1 TABLET (20 MG TOTAL) BY MOUTH DAILY., Disp: 90 tablet, Rfl: 1 .  glipiZIDE (GLUCOTROL) 10 MG tablet, TAKE 1 TABLET (10 MG TOTAL) BY MOUTH 2 (TWO) TIMES DAILY BEFORE A MEAL., Disp: 180 tablet, Rfl: 0 .  isosorbide mononitrate (IMDUR) 30 MG 24 hr tablet, Take 1 tablet (30 mg total)  by mouth daily., Disp: 90 tablet, Rfl: 3 .  liraglutide 18 MG/3ML SOPN, Inject 0.2 mLs (1.2 mg total) into the skin daily, Disp: 6 pen, Rfl: 1 .  losartan (COZAAR) 100 MG tablet, TAKE 1 TABLET (100 MG TOTAL) BY MOUTH DAILY., Disp: 90 tablet, Rfl: 0 .  metFORMIN (GLUCOPHAGE) 1000 MG tablet, Take 1 tablet (1,000 mg total) by mouth 2 (two) times daily with a meal., Disp: 180 tablet, Rfl: 1 .  Multiple Vitamins-Minerals (MULTIVITAMIN WITH MINERALS) tablet, Take 1 tablet by mouth daily., Disp: , Rfl:  .  nitroGLYCERIN (NITROSTAT) 0.4 MG SL tablet, Place 1 tablet (0.4 mg total) under the tongue every 5 (five) minutes as needed for chest pain (up to 3 doses)., Disp: 25 tablet, Rfl: 3 .  spironolactone (ALDACTONE) 25 MG tablet, TAKE 1 TABLET (25 MG TOTAL) BY MOUTH DAILY., Disp: 90 tablet, Rfl: 0  Past Medical History: Past Medical History:  Diagnosis Date  . Anemia   . Arthritis    "right knee" (10/21/2015)  . CAD (coronary artery disease)    a. Cath 10/21/15: s/p DES to RCA: moderate diffuse stenosis of mod diagonal, diffuse irregularity LCx and LAD. 2D echo 10/21/15: mod LVH, EF 60-65%, no RWMA, calcified MV, mod-severe LAE. 03/18/17 PCI with DES--> 1 diag  . Cataract   . Essential hypertension   .  Family history of adverse reaction to anesthesia    "oldest sister, [name], related to brain formation at back of head; when they put her to sleep it's hard to wake her up"  . GERD (gastroesophageal reflux disease)   . History of blood transfusion    "related to ruptured tubal pregnancy"  . Hyperlipidemia   . Hypertensive heart disease   . Morbid obesity ([place])   . Type II diabetes mellitus ([facility])     Tobacco Use: History  Smoking Status  . Never Smoker  Smokeless Tobacco  . Never Used    Labs: Recent Review Flowsheet Data    Labs for ITP Cardiac and Pulmonary Rehab Latest Ref Rng & Units 01/06/2016 08/10/2016 08/13/2016 11/15/2016 02/26/2017   Cholestrol 0 - 200 mg/dL 125 135 - 150 168   LDLCALC  0 - 99 mg/dL 75 77 - 80 101(H)   HDL >39.00 mg/dL 42(L) 43(L) - 42.70 43.30   Trlycerides 0.0 - 149.0 mg/dL 38 77 - 134.0 122.0   Hemoglobin A1c 4.6 - 6.5 % - - 14.1 Repeated and verified X2.(H) 15.3(H) 14.1(H)   PHART 7.350 - 7.450 - - - - -   PCO2ART 35.0 - 45.0 mmHg - - - - -   HCO3 20.0 - 24.0 mEq/L - - - - -   TCO2 0 - 100 mmol/L - - - - -   O2SAT % - - - - -      Capillary Blood Glucose: Lab Results  Component Value Date   GLUCAP 174 (H) 03/19/2017   GLUCAP 328 (H) 03/18/2017   GLUCAP 277 (H) 03/18/2017   GLUCAP 333 (H) 03/18/2017   GLUCAP 348 (H) 03/18/2017     Exercise Target Goals: Date: 04/23/17  Exercise Program Goal: Individual exercise prescription set with THRR, safety & activity barriers. Participant demonstrates ability to understand and report RPE using BORG scale, to self-measure pulse accurately, and to acknowledge the importance of the exercise prescription.  Exercise Prescription Goal: Starting with aerobic activity 30 plus minutes a day, 3 days per week for initial exercise prescription. Provide home exercise prescription and guidelines that participant acknowledges understanding prior to discharge.  Activity Barriers & Risk Stratification:     Activity Barriers & Cardiac Risk Stratification - 04/23/17 1115      Activity Barriers & Cardiac Risk Stratification   Activity Barriers Arthritis;Other (comment)   Comments Swelling in left leg and foot.   Cardiac Risk Stratification High      6 Minute Walk:     6 Minute Walk    Row Name 04/23/17 1114         6 Minute Walk   Phase Initial     Distance 878 feet     Walk Time 6 minutes     # of Rest Breaks 2     MPH 1.66     METS 1.45     RPE 15     VO2 Peak 5.07     Symptoms No     Resting HR 86 bpm     Resting BP 116/78     Max Ex. HR 125 bpm     Max Ex. BP 164/80     2 Minute Post BP 128/80        Oxygen Initial Assessment:   Oxygen Re-Evaluation:   Oxygen Discharge (Final  Oxygen Re-Evaluation):   Initial Exercise Prescription:     Initial Exercise Prescription - 04/23/17 1100      Date of Initial  Exercise RX and Referring Provider   Date 04/23/17   Referring Provider [name], MD.     Recumbant Bike   Level 1   Watts 15   Minutes 15   METs 2.15     NuStep   Level 2   SPM 85   Minutes 15   METs 2.2     Track   Laps 10   Minutes 15   METs 2.16     Prescription Details   Frequency (times per week) 3   Duration Progress to 30 minutes of continuous aerobic without signs/symptoms of physical distress     Intensity   THRR 40-80% of Max Heartrate 60-121   Ratings of Perceived Exertion 11-13   Perceived Dyspnea 0-4     Progression   Progression Continue to progress workloads to maintain intensity without signs/symptoms of physical distress.     Resistance Training   Training Prescription Yes   Weight 2lb wts.   Reps 10-15      Perform Capillary Blood Glucose checks as needed.  Exercise Prescription Changes:   Exercise Comments:   Exercise Goals and Review:     Exercise Goals    Row Name 04/23/17 1116             Exercise Goals   Increase Physical Activity Yes       Intervention Provide advice, education, support and counseling about physical activity/exercise needs.;Develop an individualized exercise prescription for aerobic and resistive training based on initial evaluation findings, risk stratification, comorbidities and participant's personal goals.       Expected Outcomes Achievement of increased cardiorespiratory fitness and enhanced flexibility, muscular endurance and strength shown through measurements of functional capacity and personal statement of participant.       Increase Strength and Stamina Yes       Intervention Provide advice, education, support and counseling about physical activity/exercise needs.;Develop an individualized exercise prescription for aerobic and resistive training based on initial  evaluation findings, risk stratification, comorbidities and participant's personal goals.       Expected Outcomes Achievement of increased cardiorespiratory fitness and enhanced flexibility, muscular endurance and strength shown through measurements of functional capacity and personal statement of participant.          Exercise Goals Re-Evaluation :    Discharge Exercise Prescription (Final Exercise Prescription Changes):   Nutrition:  Target Goals: Understanding of nutrition guidelines, daily intake of sodium 1500mg , cholesterol 200mg , calories 30% from fat and 7% or less from saturated fats, daily to have 5 or more servings of fruits and vegetables.  Biometrics:     Pre Biometrics - 04/23/17 1009      Pre Biometrics   Height 5' 5.25" (1.657 m)   Waist Circumference 49 inches   Hip Circumference 62 inches   Waist to Hip Ratio 0.79 %   BMI (Calculated) 51.1   Triceps Skinfold 67 mm   % Body Fat 61.2 %   Grip Strength 26 kg   Flexibility 10 in   Single Leg Stand 7.03 seconds       Nutrition Therapy Plan and Nutrition Goals:   Nutrition Discharge: Nutrition Scores:   Nutrition Goals Re-Evaluation:   Nutrition Goals Re-Evaluation:   Nutrition Goals Discharge (Final Nutrition Goals Re-Evaluation):   Psychosocial: Target Goals: Acknowledge presence or absence of significant depression and/or stress, maximize coping skills, provide positive support system. Participant is able to verbalize types and ability to use techniques and skills needed for reducing stress and depression.  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  unfortunately her sister's failing health is making home caregiving more challenging.  pt also expresses concern  about progression of her CAD     Family Dynamics   Good Support System? Yes  sister, [name] and her 3 children     Barriers   Psychosocial barriers to participate in program The patient should benefit from training in stress management and relaxation.     Screening Interventions   Interventions Encouraged to exercise;Provide feedback about the scores to participant;To provide support and resources with identified psychosocial needs      Quality of Life Scores:     Quality of Life - 04/23/17 1118      Quality of Life Scores   Health/Function Pre 18.11 %   Socioeconomic Pre 26.64 %   Psych/Spiritual Pre 28 %   Family Pre 22.5 %   GLOBAL Pre 22.69 %      PHQ-9: Recent Review Flowsheet Data    Depression screen [facility] 2/9 10/16/2016 03/16/2016 11/23/2015 10/10/2015 01/29/2014   Decreased Interest 0 0 0 0 0   Down, Depressed, Hopeless 0 0 0 0 0   PHQ - 2 Score 0 0 0 0 0     Interpretation of Total Score  Total Score Depression Severity:  1-4 = Minimal depression, 5-9 = Mild depression, 10-14 = Moderate depression, 15-19 = Moderately severe depression, 20-27 = Severe depression   Psychosocial Evaluation and Intervention:   Psychosocial Re-Evaluation:   Psychosocial Discharge (Final Psychosocial Re-Evaluation):   Vocational Rehabilitation: Provide vocational rehab assistance to qualifying candidates.   Vocational Rehab Evaluation & Intervention:     Vocational Rehab - 04/23/17 1209      Initial Vocational Rehab Evaluation & Intervention   Assessment shows need for Vocational Rehabilitation No  retired       Education: Education Goals: Education classes will be provided on a weekly basis, covering required topics. Participant will state understanding/return demonstration of topics presented.  Learning Barriers/Preferences:     Learning Barriers/Preferences - 04/23/17 1154      Learning Barriers/Preferences   Learning Barriers Sight   Learning Preferences  Written Material      Education Topics: Count Your Pulse:  -Group instruction provided by verbal instruction, demonstration, patient participation and written materials to support subject.  Instructors address importance of being able to find your pulse and how to count your pulse when at home without a heart monitor.  Patients get hands on experience counting their pulse with staff help and individually.   Heart Attack, Angina, and Risk Factor Modification:  -Group instruction provided by verbal instruction, video, and written materials to support subject.  Instructors address signs and symptoms of angina and heart attacks.    Also discuss risk factors for heart disease and how to make changes to improve heart health risk factors.   Functional Fitness:  -Group instruction provided by verbal instruction, demonstration, patient participation, and written materials to support subject.  Instructors address safety measures for doing things around the house.  Discuss how to get up and down off the floor, how to pick things up properly, how to safely get out of a chair without assistance, and balance training.   Meditation and Mindfulness:  -  Group instruction provided by verbal instruction, patient participation, and written materials to support subject.  Instructor addresses importance of mindfulness and meditation practice to help reduce stress and improve awareness.  Instructor also leads participants through a meditation exercise.    Stretching for Flexibility and Mobility:  -Group instruction provided by verbal instruction, patient participation, and written materials to support subject.  Instructors lead participants through series of stretches that are designed to increase flexibility thus improving mobility.  These stretches are additional exercise for major muscle groups that are typically performed during regular warm up and cool down.   Hands Only CPR:  -Group verbal, video, and  participation provides a basic overview of AHA guidelines for community CPR. Role-play of emergencies allow participants the opportunity to practice calling for help and chest compression technique with discussion of AED use.   Hypertension: -Group verbal and written instruction that provides a basic overview of hypertension including the most recent diagnostic guidelines, risk factor reduction with self-care instructions and medication management.    Nutrition I class: Heart Healthy Eating:  -Group instruction provided by PowerPoint slides, verbal discussion, and written materials to support subject matter. The instructor gives an explanation and review of the Therapeutic Lifestyle Changes diet recommendations, which includes a discussion on lipid goals, dietary fat, sodium, fiber, plant stanol/sterol esters, sugar, and the components of a well-balanced, healthy diet.   Nutrition II class: Lifestyle Skills:  -Group instruction provided by PowerPoint slides, verbal discussion, and written materials to support subject matter. The instructor gives an explanation and review of label reading, grocery shopping for heart health, heart healthy recipe modifications, and ways to make healthier choices when eating out.   Diabetes Question & Answer:  -Group instruction provided by PowerPoint slides, verbal discussion, and written materials to support subject matter. The instructor gives an explanation and review of diabetes co-morbidities, pre- and post-prandial blood glucose goals, pre-exercise blood glucose goals, signs, symptoms, and treatment of hypoglycemia and hyperglycemia, and foot care basics.   Diabetes Blitz:  -Group instruction provided by PowerPoint slides, verbal discussion, and written materials to support subject matter. The instructor gives an explanation and review of the physiology behind type 1 and type 2 diabetes, diabetes medications and rational behind using different medications,  pre- and post-prandial blood glucose recommendations and Hemoglobin A1c goals, diabetes diet, and exercise including blood glucose guidelines for exercising safely.    Portion Distortion:  -Group instruction provided by PowerPoint slides, verbal discussion, written materials, and food models to support subject matter. The instructor gives an explanation of serving size versus portion size, changes in portions sizes over the last 20 years, and what consists of a serving from each food group.   Stress Management:  -Group instruction provided by verbal instruction, video, and written materials to support subject matter.  Instructors review role of stress in heart disease and how to cope with stress positively.     Exercising on Your Own:  -Group instruction provided by verbal instruction, power point, and written materials to support subject.  Instructors discuss benefits of exercise, components of exercise, frequency and intensity of exercise, and end points for exercise.  Also discuss use of nitroglycerin and activating EMS.  Review options of places to exercise outside of rehab.  Review guidelines for sex with heart disease.   Cardiac Drugs I:  -Group instruction provided by verbal instruction and written materials to support subject.  Instructor reviews cardiac drug classes: antiplatelets, anticoagulants, beta blockers, and statins.  Instructor discusses reasons, side effects,  and lifestyle considerations for each drug class.   Cardiac Drugs II:  -Group instruction provided by verbal instruction and written materials to support subject.  Instructor reviews cardiac drug classes: angiotensin converting enzyme inhibitors (ACE-I), angiotensin II receptor blockers (ARBs), nitrates, and calcium channel blockers.  Instructor discusses reasons, side effects, and lifestyle considerations for each drug class.   Anatomy and Physiology of the Circulatory System:  Group verbal and written instruction and  models provide basic cardiac anatomy and physiology, with the coronary electrical and arterial systems. Review of: AMI, Angina, Valve disease, Heart Failure, Peripheral Artery Disease, Cardiac Arrhythmia, Pacemakers, and the ICD.   Other Education:  -Group or individual verbal, written, or video instructions that support the educational goals of the cardiac rehab program.   Knowledge Questionnaire Score:     Knowledge Questionnaire Score - 04/23/17 1117      Knowledge Questionnaire Score   Pre Score 23/24      Core Components/Risk Factors/Patient Goals at Admission:     Personal Goals and Risk Factors at Admission - 04/23/17 1011      Core Components/Risk Factors/Patient Goals on Admission    Weight Management Yes;Obesity;Weight Maintenance;Weight Loss   Intervention Weight Management: Develop a combined nutrition and exercise program designed to reach desired caloric intake, while maintaining appropriate intake of nutrient and fiber, sodium and fats, and appropriate energy expenditure required for the weight goal.;Weight Management: Provide education and appropriate resources to help participant work on and attain dietary goals.;Obesity: Provide education and appropriate resources to help participant work on and attain dietary goals.;Weight Management/Obesity: Establish reasonable short term and long term weight goals.   Admit Weight 308 lb 10.3 oz (140 kg)   Goal Weight: Short Term 305 lb 5.4 oz (138.5 kg)   Goal Weight: Long Term 300 lb (136.1 kg)   Expected Outcomes Short Term: Continue to assess and modify interventions until short term weight is achieved;Weight Maintenance: Understanding of the daily nutrition guidelines, which includes 25-35% calories from fat, 7% or less cal from saturated fats, less than 200mg  cholesterol, less than 1.5gm of sodium, & 5 or more servings of fruits and vegetables daily;Long Term: Adherence to nutrition and physical activity/exercise program aimed  toward attainment of established weight goal;Weight Loss: Understanding of general recommendations for a balanced deficit meal plan, which promotes 1-2 lb weight loss per week and includes a negative energy balance of [phone] kcal/d;Understanding recommendations for meals to include 15-35% energy as protein, 25-35% energy from fat, 35-60% energy from carbohydrates, less than 200mg  of dietary cholesterol, 20-35 gm of total fiber daily;Understanding of distribution of calorie intake throughout the day with the consumption of 4-5 meals/snacks   Diabetes Yes   Intervention Provide education about signs/symptoms and action to take for hypo/hyperglycemia.;Provide education about proper nutrition, including hydration, and aerobic/resistive exercise prescription along with prescribed medications to achieve blood glucose in normal ranges: Fasting glucose 65-99 mg/dL   Expected Outcomes Short Term: Participant verbalizes understanding of the signs/symptoms and immediate care of hyper/hypoglycemia, proper foot care and importance of medication, aerobic/resistive exercise and nutrition plan for blood glucose control.   Hypertension Yes   Intervention Provide education on lifestyle modifcations including regular physical activity/exercise, weight management, moderate sodium restriction and increased consumption of fresh fruit, vegetables, and low fat dairy, alcohol moderation, and smoking cessation.;Monitor prescription use compliance.   Expected Outcomes Short Term: Continued assessment and intervention until BP is < 140/97mm HG in hypertensive participants. < 130/66mm HG in hypertensive participants with diabetes, heart failure or  chronic kidney disease.;Long Term: Maintenance of blood pressure at goal levels.   Stress Yes   Intervention Offer individual and/or small group education and counseling on adjustment to heart disease, stress management and health-related lifestyle change. Teach and support self-help  strategies.;Refer participants experiencing significant psychosocial distress to appropriate mental health specialists for further evaluation and treatment. When possible, include family members and significant others in education/counseling sessions.   Expected Outcomes Short Term: Participant demonstrates changes in health-related behavior, relaxation and other stress management skills, ability to obtain effective social support, and compliance with psychotropic medications if prescribed.;Long Term: Emotional wellbeing is indicated by absence of clinically significant psychosocial distress or social isolation.      Core Components/Risk Factors/Patient Goals Review:    Core Components/Risk Factors/Patient Goals at Discharge (Final Review):    ITP Comments:     ITP Comments    Row Name 04/23/17 0815           ITP Comments Medical Director- Dr. Fransico Him, MD.          Comments: Patient attended orientation from [place] to 0915 to review rules and guidelines for program. Completed 6 minute walk test, Intitial ITP, and exercise prescription.  VSS. Telemetry-sinus rhythm.  Asymptomatic.

## 2017-04-29 ENCOUNTER — Encounter (HOSPITAL_COMMUNITY)
Admission: RE | Admit: 2017-04-29 | Discharge: 2017-04-29 | Disposition: A | Discharge status: Home / Self Care | Payer: Medicare Other | Source: Ambulatory Visit | Attending: Internal Medicine | Admitting: Internal Medicine

## 2017-04-29 DIAGNOSIS — Z955 Presence of coronary angioplasty implant and graft: Secondary | ICD-10-CM

## 2017-04-29 DIAGNOSIS — Z48812 Encounter for surgical aftercare following surgery on the circulatory system: Secondary | ICD-10-CM | POA: Diagnosis not present

## 2017-04-29 LAB — GLUCOSE, CAPILLARY: Glucose-Capillary: 211 mg/dL — ABNORMAL HIGH (ref 65–99)

## 2017-04-30 ENCOUNTER — Encounter (HOSPITAL_COMMUNITY): Payer: Self-pay

## 2017-04-30 NOTE — Progress Notes (Signed)
Daily Session Note  Patient Details  Name: Alicia [name] MRN: [id number] Date of Birth: 07-20-1946 Referring Provider:     CARDIAC REHAB PHASE II ORIENTATION from 04/23/2017 in [place]  Referring Provider  [name], MD.      Encounter Date: 04/29/2017  Check In:     Session Check In - 04/29/17 1403      Check-In   Location [facility]   Staff Present Cleda Mccreedy, MS, Exercise Physiologist;Carlette Wilber Oliphant, RN, BSN;Darion Juhasz, RN, BSN;Molly diVincenzo, MS, ACSM RCEP, Exercise Physiologist   Supervising physician immediately available to respond to emergencies [facility] immediately available   Physician(s) Dr Posey Pronto   Medication changes reported     No   Fall or balance concerns reported    No   Tobacco Cessation No Change   Warm-up and Cool-down Not performed (comment)   Resistance Training Performed Yes   VAD Patient? No     Pain Assessment   Currently in Pain? No/denies   Multiple Pain Sites No      Capillary Blood Glucose: Results for orders placed or performed during the hospital encounter of 04/29/17 (from the past 24 hour(s))  Glucose, capillary     Status: Abnormal   Collection Time: 04/29/17  1:49 PM  Result Value Ref Range   Glucose-Capillary 211 (H) 65 - 99 mg/dL      History  Smoking Status  . Never Smoker  Smokeless Tobacco  . Never Used    Goals Met:  Exercise tolerated well  Goals Unmet:  Not Applicable  Comments: Pt started cardiac rehab today.  Pt tolerated light exercise without difficulty. VSS, telemetry-sinus rhythm, asymptomatic.  Medication list reconciled. Pt denies barriers to medicaiton compliance.  PSYCHOSOCIAL ASSESSMENT:  PHQ-1. 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   Pt exhibits caregiver strain and fatigue, otherwise. Pt exhibits positive coping skills, hopeful outlook with supportive family. No psychosocial needs identified at this time, no psychosocial  interventions necessary.    Pt enjoys reading, writing novels and going to movies.  Pt goals for cardiac rehab are to lose weight.  She would like to lose 40lb. Pt would also like to improve her outlook and attitude.  Pt encouraged to participate in CR activities of exercise, nutrition and lifestyle modification education to help achieve these goals.   Pt oriented to exercise equipment oroutine.    Understanding verbalized.   Dr. Fransico Him is Medical Director for Cardiac Rehab at [facility].

## 2017-05-01 ENCOUNTER — Encounter (HOSPITAL_COMMUNITY)
Admission: RE | Admit: 2017-05-01 | Discharge: 2017-05-01 | Disposition: A | Discharge status: Home / Self Care | Payer: Medicare Other | Source: Ambulatory Visit | Attending: Internal Medicine | Admitting: Internal Medicine

## 2017-05-01 DIAGNOSIS — Z48812 Encounter for surgical aftercare following surgery on the circulatory system: Secondary | ICD-10-CM | POA: Diagnosis not present

## 2017-05-01 DIAGNOSIS — Z955 Presence of coronary angioplasty implant and graft: Secondary | ICD-10-CM

## 2017-05-01 LAB — GLUCOSE, CAPILLARY: Glucose-Capillary: 172 mg/dL — ABNORMAL HIGH (ref 65–99)

## 2017-05-03 ENCOUNTER — Encounter (HOSPITAL_COMMUNITY)
Admission: RE | Admit: 2017-05-03 | Discharge: 2017-05-03 | Disposition: A | Discharge status: Home / Self Care | Payer: Medicare Other | Source: Ambulatory Visit | Attending: Internal Medicine | Admitting: Internal Medicine

## 2017-05-03 DIAGNOSIS — Z48812 Encounter for surgical aftercare following surgery on the circulatory system: Secondary | ICD-10-CM | POA: Diagnosis not present

## 2017-05-03 DIAGNOSIS — Z955 Presence of coronary angioplasty implant and graft: Secondary | ICD-10-CM

## 2017-05-03 LAB — GLUCOSE, CAPILLARY
GLUCOSE-CAPILLARY: 133 mg/dL — AB (ref 65–99)
GLUCOSE-CAPILLARY: 175 mg/dL — AB (ref 65–99)

## 2017-05-06 ENCOUNTER — Encounter (HOSPITAL_COMMUNITY)
Admission: RE | Admit: 2017-05-06 | Discharge: 2017-05-06 | Disposition: A | Discharge status: Home / Self Care | Payer: Medicare Other | Source: Ambulatory Visit | Attending: Internal Medicine | Admitting: Internal Medicine

## 2017-05-06 DIAGNOSIS — Z48812 Encounter for surgical aftercare following surgery on the circulatory system: Secondary | ICD-10-CM | POA: Diagnosis not present

## 2017-05-06 DIAGNOSIS — Z955 Presence of coronary angioplasty implant and graft: Secondary | ICD-10-CM

## 2017-05-06 LAB — GLUCOSE, CAPILLARY: Glucose-Capillary: 170 mg/dL — ABNORMAL HIGH (ref 65–99)

## 2017-05-07 NOTE — Progress Notes (Signed)
Cardiac Individual Treatment Plan  Patient Details  Name: Alicia [name] MRN: [id number] Date of Birth: Dec 21, 1945 Referring Provider:     CARDIAC REHAB PHASE II ORIENTATION from 04/23/2017 in [facility]  Referring Provider  [name], MD.      Initial Encounter Date:    CARDIAC REHAB PHASE II ORIENTATION from 04/23/2017 in [facility]  Date  04/23/17  Referring Provider  [name], MD.      Visit Diagnosis: 03/18/17 DES D1  Patient's Home Medications on Admission:  Current Outpatient Prescriptions:  .  acetaminophen (TYLENOL) 325 MG tablet, Take 2 tablets (650 mg total) by mouth every 4 (four) hours as needed for headache or mild pain., Disp: , Rfl:  .  amLODipine (NORVASC) 5 MG tablet, Take 1 tablet (5 mg total) by mouth daily., Disp: 90 tablet, Rfl: 3 .  aspirin 81 MG tablet, Take 81 mg by mouth daily., Disp: , Rfl:  .  atorvastatin (LIPITOR) 80 MG tablet, TAKE 1 TABLET BY MOUTH EVERY EVENING, Disp: 90 tablet, Rfl: 1 .  carvedilol (COREG) 25 MG tablet, Take 1 tablet (25 mg total) by mouth 2 (two) times daily., Disp: 180 tablet, Rfl: 3 .  clopidogrel (PLAVIX) 75 MG tablet, TAKE 1 TABLET (75 MG TOTAL) BY MOUTH DAILY., Disp: 90 tablet, Rfl: 2 .  DiphenhydrAMINE HCl, Sleep, (UNISOM SLEEPGELS PO), Take 1 tablet by mouth at bedtime as needed (SLEEP)., Disp: , Rfl:  .  doxazosin (CARDURA) 4 MG tablet, TAKE 1 TABLET (4 MG TOTAL) BY MOUTH AT BEDTIME., Disp: 90 tablet, Rfl: 0 .  ferrous sulfate 325 (65 FE) MG tablet, TAKE 1 TABLET (325 MG TOTAL) BY MOUTH 3 (THREE) TIMES DAILY WITH MEALS., Disp: 90 tablet, Rfl: 3 .  furosemide (LASIX) 20 MG tablet, TAKE 1 TABLET (20 MG TOTAL) BY MOUTH DAILY., Disp: 90 tablet, Rfl: 1 .  glipiZIDE (GLUCOTROL) 10 MG tablet, TAKE 1 TABLET (10 MG TOTAL) BY MOUTH 2 (TWO) TIMES DAILY BEFORE A MEAL., Disp: 180 tablet, Rfl: 0 .  isosorbide mononitrate (IMDUR) 30 MG 24 hr tablet, Take 1 tablet (30 mg total)  by mouth daily., Disp: 90 tablet, Rfl: 3 .  liraglutide 18 MG/3ML SOPN, Inject 0.2 mLs (1.2 mg total) into the skin daily, Disp: 6 pen, Rfl: 1 .  losartan (COZAAR) 100 MG tablet, TAKE 1 TABLET (100 MG TOTAL) BY MOUTH DAILY., Disp: 90 tablet, Rfl: 0 .  metFORMIN (GLUCOPHAGE) 1000 MG tablet, Take 1 tablet (1,000 mg total) by mouth 2 (two) times daily with a meal., Disp: 180 tablet, Rfl: 1 .  Multiple Vitamins-Minerals (MULTIVITAMIN WITH MINERALS) tablet, Take 1 tablet by mouth daily., Disp: , Rfl:  .  nitroGLYCERIN (NITROSTAT) 0.4 MG SL tablet, Place 1 tablet (0.4 mg total) under the tongue every 5 (five) minutes as needed for chest pain (up to 3 doses)., Disp: 25 tablet, Rfl: 3 .  spironolactone (ALDACTONE) 25 MG tablet, TAKE 1 TABLET (25 MG TOTAL) BY MOUTH DAILY., Disp: 90 tablet, Rfl: 0  Past Medical History: Past Medical History:  Diagnosis Date  . Anemia   . Arthritis    "right knee" (10/21/2015)  . CAD (coronary artery disease)    a. Cath 10/21/15: s/p DES to RCA: moderate diffuse stenosis of mod diagonal, diffuse irregularity LCx and LAD. 2D echo 10/21/15: mod LVH, EF 60-65%, no RWMA, calcified MV, mod-severe LAE. 03/18/17 PCI with DES--> 1 diag  . Cataract   . Essential hypertension   .  Family history of adverse reaction to anesthesia    "oldest sister, [name], related to brain formation at back of head; when they put her to sleep it's hard to wake her up"  . GERD (gastroesophageal reflux disease)   . History of blood transfusion    "related to ruptured tubal pregnancy"  . Hyperlipidemia   . Hypertensive heart disease   . Morbid obesity ([place])   . Type II diabetes mellitus ([facility])     Tobacco Use: History  Smoking Status  . Never Smoker  Smokeless Tobacco  . Never Used    Labs: Recent Review Flowsheet Data    Labs for ITP Cardiac and Pulmonary Rehab Latest Ref Rng & Units 01/06/2016 08/10/2016 08/13/2016 11/15/2016 02/26/2017   Cholestrol 0 - 200 mg/dL 125 135 - 150 168   LDLCALC  0 - 99 mg/dL 75 77 - 80 101(H)   HDL >39.00 mg/dL 42(L) 43(L) - 42.70 43.30   Trlycerides 0.0 - 149.0 mg/dL 38 77 - 134.0 122.0   Hemoglobin A1c 4.6 - 6.5 % - - 14.1 Repeated and verified X2.(H) 15.3(H) 14.1(H)   PHART 7.350 - 7.450 - - - - -   PCO2ART 35.0 - 45.0 mmHg - - - - -   HCO3 20.0 - 24.0 mEq/L - - - - -   TCO2 0 - 100 mmol/L - - - - -   O2SAT % - - - - -      Capillary Blood Glucose: Lab Results  Component Value Date   GLUCAP 170 (H) 05/06/2017   GLUCAP 175 (H) 05/03/2017   GLUCAP 133 (H) 05/03/2017   GLUCAP 172 (H) 05/01/2017   GLUCAP 211 (H) 04/29/2017     Exercise Target Goals:    Exercise Program Goal: Individual exercise prescription set with THRR, safety & activity barriers. Participant demonstrates ability to understand and report RPE using BORG scale, to self-measure pulse accurately, and to acknowledge the importance of the exercise prescription.  Exercise Prescription Goal: Starting with aerobic activity 30 plus minutes a day, 3 days per week for initial exercise prescription. Provide home exercise prescription and guidelines that participant acknowledges understanding prior to discharge.  Activity Barriers & Risk Stratification:     Activity Barriers & Cardiac Risk Stratification - 04/23/17 1115      Activity Barriers & Cardiac Risk Stratification   Activity Barriers Arthritis;Other (comment)   Comments Swelling in left leg and foot.   Cardiac Risk Stratification High      6 Minute Walk:     6 Minute Walk    Row Name 04/23/17 1114         6 Minute Walk   Phase Initial     Distance 878 feet     Walk Time 6 minutes     # of Rest Breaks 2     MPH 1.66     METS 1.45     RPE 15     VO2 Peak 5.07     Symptoms No     Resting HR 86 bpm     Resting BP 116/78     Max Ex. HR 125 bpm     Max Ex. BP 164/80     2 Minute Post BP 128/80        Oxygen Initial Assessment:   Oxygen Re-Evaluation:   Oxygen Discharge (Final Oxygen  Re-Evaluation):   Initial Exercise Prescription:     Initial Exercise Prescription - 04/23/17 1100      Date of Initial  Exercise RX and Referring Provider   Date 04/23/17   Referring Provider [name], MD.     Recumbant Bike   Level 1   Watts 15   Minutes 15   METs 2.15     NuStep   Level 2   SPM 85   Minutes 15   METs 2.2     Track   Laps 10   Minutes 15   METs 2.16     Prescription Details   Frequency (times per week) 3   Duration Progress to 30 minutes of continuous aerobic without signs/symptoms of physical distress     Intensity   THRR 40-80% of Max Heartrate 60-121   Ratings of Perceived Exertion 11-13   Perceived Dyspnea 0-4     Progression   Progression Continue to progress workloads to maintain intensity without signs/symptoms of physical distress.     Resistance Training   Training Prescription Yes   Weight 2lb wts.   Reps 10-15      Perform Capillary Blood Glucose checks as needed.  Exercise Prescription Changes:     Exercise Prescription Changes    Row Name 05/06/17 1600             Response to Exercise   Blood Pressure (Admit) 136/80       Blood Pressure (Exercise) 140/80       Blood Pressure (Exit) 122/66       Heart Rate (Admit) 90 bpm       Heart Rate (Exercise) 109 bpm       Heart Rate (Exit) 90 bpm       Rating of Perceived Exertion (Exercise) 13       Symptoms none       Comments Pt was oriented to exercise equipment 04/29/17       Duration Continue with 30 min of aerobic exercise without signs/symptoms of physical distress.       Intensity THRR unchanged         Progression   Progression Continue to progress workloads to maintain intensity without signs/symptoms of physical distress.       Average METs 1.8         Resistance Training   Training Prescription Yes       Weight 1lb       Reps 10-15       Time 10 Minutes         Recumbant Bike   Level 2       Watts 15       Minutes 15       METs 1.9          NuStep   Level 2       SPM 80       Minutes 15       METs 1.7          Exercise Comments:     Exercise Comments    Row Name 05/06/17 1634           Exercise Comments Pt was oriented to exercise eqipment on 04/29/17. Pt is tolerating exercise well; will continue to monitor activity levels and exercise progression.          Exercise Goals and Review:     Exercise Goals    Row Name 04/23/17 1116             Exercise Goals   Increase Physical Activity Yes       Intervention Provide advice, education, support and  counseling about physical activity/exercise needs.;Develop an individualized exercise prescription for aerobic and resistive training based on initial evaluation findings, risk stratification, comorbidities and participant's personal goals.       Expected Outcomes Achievement of increased cardiorespiratory fitness and enhanced flexibility, muscular endurance and strength shown through measurements of functional capacity and personal statement of participant.       Increase Strength and Stamina Yes       Intervention Provide advice, education, support and counseling about physical activity/exercise needs.;Develop an individualized exercise prescription for aerobic and resistive training based on initial evaluation findings, risk stratification, comorbidities and participant's personal goals.       Expected Outcomes Achievement of increased cardiorespiratory fitness and enhanced flexibility, muscular endurance and strength shown through measurements of functional capacity and personal statement of participant.          Exercise Goals Re-Evaluation :    Discharge Exercise Prescription (Final Exercise Prescription Changes):     Exercise Prescription Changes - 05/06/17 1600      Response to Exercise   Blood Pressure (Admit) 136/80   Blood Pressure (Exercise) 140/80   Blood Pressure (Exit) 122/66   Heart Rate (Admit) 90 bpm   Heart Rate (Exercise) 109 bpm   Heart  Rate (Exit) 90 bpm   Rating of Perceived Exertion (Exercise) 13   Symptoms none   Comments Pt was oriented to exercise equipment 04/29/17   Duration Continue with 30 min of aerobic exercise without signs/symptoms of physical distress.   Intensity THRR unchanged     Progression   Progression Continue to progress workloads to maintain intensity without signs/symptoms of physical distress.   Average METs 1.8     Resistance Training   Training Prescription Yes   Weight 1lb   Reps 10-15   Time 10 Minutes     Recumbant Bike   Level 2   Watts 15   Minutes 15   METs 1.9     NuStep   Level 2   SPM 80   Minutes 15   METs 1.7      Nutrition:  Target Goals: Understanding of nutrition guidelines, daily intake of sodium 1500mg , cholesterol 200mg , calories 30% from fat and 7% or less from saturated fats, daily to have 5 or more servings of fruits and vegetables.  Biometrics:     Pre Biometrics - 04/23/17 1009      Pre Biometrics   Height 5' 5.25" (1.657 m)   Waist Circumference 49 inches   Hip Circumference 62 inches   Waist to Hip Ratio 0.79 %   BMI (Calculated) 51.1   Triceps Skinfold 67 mm   % Body Fat 61.2 %   Grip Strength 26 kg   Flexibility 10 in   Single Leg Stand 7.03 seconds       Nutrition Therapy Plan and Nutrition Goals:     Nutrition Therapy & Goals - 04/30/17 1523      Nutrition Therapy   Diet Carb Modified, Therapeutic Lifestyle Changes     Personal Nutrition Goals   Nutrition Goal Wt loss of 1-2 lb/week to a wt loss goal of 6-24 lb at graduation from [facility], educate and counsel regarding individualized specific dietary modifications aiming towards targeted core components such as weight, hypertension, lipid management, diabetes, heart failure and other comorbidities.   Expected Outcomes Short Term Goal: Understand basic principles of dietary content, such as calories, fat, sodium, cholesterol  and nutrients.;Long Term  Goal: Adherence to prescribed nutrition plan.      Nutrition Discharge: Nutrition Scores:     Nutrition Assessments - 04/30/17 1524      MEDFICTS Scores   Pre Score 32      Nutrition Goals Re-Evaluation:   Nutrition Goals Re-Evaluation:   Nutrition Goals Discharge (Final Nutrition Goals Re-Evaluation):   Psychosocial: Target Goals: Acknowledge presence or absence of significant depression and/or stress, maximize coping skills, provide positive support system. Participant is able to verbalize types and ability to use techniques and skills needed for reducing stress and depression.  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  unfortunately her sister's failing health is making home caregiving more challenging.  pt also expresses concern about progression of her CAD     Family Dynamics   Good Support System? Yes  sister, [name] and her 3 children     Barriers   Psychosocial barriers to participate in program The patient should benefit from training in stress management and relaxation.     Screening Interventions   Interventions Encouraged to exercise;Provide feedback about the scores to participant;To provide support and resources with identified psychosocial needs      Quality of Life Scores:     Quality of Life - 05/06/17 1617      Quality of Life Scores   Health/Function Pre 18.11 %  pt with multiple health related concerns. pt with decreased strength/stamina experiences dyspnea with climbing stairs or walking in hot temperatures.  pt reports her stamina is improving she is now able to sweep and mop however has difficulty dusting.     Socioeconomic Pre 26.64 %   Psych/Spiritual Pre 28 %   Family Pre 22.5 %    GLOBAL Pre 22.69 %  pt given emotional support and reassurance.        PHQ-9: Recent Review Flowsheet Data    Depression screen [facility] 2/9 04/30/2017 10/16/2016 03/16/2016 11/23/2015 10/10/2015   Decreased Interest 0 0 0 0 0   Down, Depressed, Hopeless 1  0 0 0 0   PHQ - 2 Score 1 0 0 0 0     Interpretation of Total Score  Total Score Depression Severity:  1-4 = Minimal depression, 5-9 = Mild depression, 10-14 = Moderate depression, 15-19 = Moderately severe depression, 20-27 = Severe depression   Psychosocial Evaluation and Intervention:     Psychosocial Evaluation - 04/30/17 0705      Psychosocial Evaluation & Interventions   Interventions Stress management education;Relaxation education;Encouraged to exercise with the program and follow exercise prescription   Comments pt will need stress management and taking care of self tools to reduce caregiver fatigue.    Expected Outcomes pt will exhibit positive outlook with good coping skills.    Continue Psychosocial Services  No Follow up required      Psychosocial Re-Evaluation:     Psychosocial Re-Evaluation    [place] Name 05/07/17 1651             Psychosocial Re-Evaluation   Current issues with Current Stress Concerns;History of Depression       Comments pt exhibits caregiver stress and fatigue. pt encouraged to participate in self care activities.         Expected Outcomes pt will  demonstrate positive outlook with good coping skills and self care managment.        Interventions Encouraged to attend Cardiac Rehabilitation for the exercise;Stress management education;Relaxation education       Continue Psychosocial Services  Follow up required by staff         Initial Review   Source of Stress Concerns Chronic Illness;Family          Psychosocial Discharge (Final Psychosocial Re-Evaluation):     Psychosocial Re-Evaluation - 05/07/17 1651      Psychosocial Re-Evaluation   Current issues with Current Stress  Concerns;History of Depression   Comments pt exhibits caregiver stress and fatigue. pt encouraged to participate in self care activities.     Expected Outcomes pt will demonstrate positive outlook with good coping skills and self care managment.    Interventions Encouraged to attend Cardiac Rehabilitation for the exercise;Stress management education;Relaxation education   Continue Psychosocial Services  Follow up required by staff     Initial Review   Source of Stress Concerns Chronic Illness;Family      Vocational Rehabilitation: Provide vocational rehab assistance to qualifying candidates.   Vocational Rehab Evaluation & Intervention:     Vocational Rehab - 04/23/17 1209      Initial Vocational Rehab Evaluation & Intervention   Assessment shows need for Vocational Rehabilitation No  retired       Education: Education Goals: Education classes will be provided on a weekly basis, covering required topics. Participant will state understanding/return demonstration of topics presented.  Learning Barriers/Preferences:     Learning Barriers/Preferences - 04/23/17 1154      Learning Barriers/Preferences   Learning Barriers Sight   Learning Preferences Written Material      Education Topics: Count Your Pulse:  -Group instruction provided by verbal instruction, demonstration, patient participation and written materials to support subject.  Instructors address importance of being able to find your pulse and how to count your pulse when at home without a heart monitor.  Patients get hands on experience counting their pulse with staff help and individually.   Heart Attack, Angina, and Risk Factor Modification:  -Group instruction provided by verbal instruction, video, and written materials to support subject.  Instructors address signs and symptoms of angina and heart attacks.    Also discuss risk factors for heart disease and how to make changes to improve heart health risk  factors.   Functional Fitness:  -Group instruction provided by verbal instruction, demonstration, patient participation, and written materials to support subject.  Instructors address safety measures for doing things around the house.  Discuss how to get up and down off the floor, how to pick things up properly, how to safely get out of a chair without assistance, and balance training.   Meditation and Mindfulness:  -Group instruction provided by verbal instruction, patient participation, and written materials to support subject.  Instructor addresses importance of mindfulness and meditation practice to help reduce stress and improve awareness.  Instructor also leads participants through a meditation exercise.    Stretching for Flexibility and Mobility:  -Group instruction provided by verbal instruction, patient participation, and written materials to support subject.  Instructors lead participants through series of stretches that are designed to increase flexibility thus improving mobility.  These stretches are additional exercise for major muscle groups that are typically performed during regular warm up and cool down.   Hands Only CPR:  -Group verbal, video, and participation provides a basic overview of AHA guidelines for community CPR. Role-play  of emergencies allow participants the opportunity to practice calling for help and chest compression technique with discussion of AED use.   Hypertension: -Group verbal and written instruction that provides a basic overview of hypertension including the most recent diagnostic guidelines, risk factor reduction with self-care instructions and medication management.    Nutrition I class: Heart Healthy Eating:  -Group instruction provided by PowerPoint slides, verbal discussion, and written materials to support subject matter. The instructor gives an explanation and review of the Therapeutic Lifestyle Changes diet recommendations, which includes a  discussion on lipid goals, dietary fat, sodium, fiber, plant stanol/sterol esters, sugar, and the components of a well-balanced, healthy diet.   Nutrition II class: Lifestyle Skills:  -Group instruction provided by PowerPoint slides, verbal discussion, and written materials to support subject matter. The instructor gives an explanation and review of label reading, grocery shopping for heart health, heart healthy recipe modifications, and ways to make healthier choices when eating out.   Diabetes Question & Answer:  -Group instruction provided by PowerPoint slides, verbal discussion, and written materials to support subject matter. The instructor gives an explanation and review of diabetes co-morbidities, pre- and post-prandial blood glucose goals, pre-exercise blood glucose goals, signs, symptoms, and treatment of hypoglycemia and hyperglycemia, and foot care basics.   Diabetes Blitz:  -Group instruction provided by PowerPoint slides, verbal discussion, and written materials to support subject matter. The instructor gives an explanation and review of the physiology behind type 1 and type 2 diabetes, diabetes medications and rational behind using different medications, pre- and post-prandial blood glucose recommendations and Hemoglobin A1c goals, diabetes diet, and exercise including blood glucose guidelines for exercising safely.    Portion Distortion:  -Group instruction provided by PowerPoint slides, verbal discussion, written materials, and food models to support subject matter. The instructor gives an explanation of serving size versus portion size, changes in portions sizes over the last 20 years, and what consists of a serving from each food group.   Stress Management:  -Group instruction provided by verbal instruction, video, and written materials to support subject matter.  Instructors review role of stress in heart disease and how to cope with stress positively.     Exercising on Your  Own:  -Group instruction provided by verbal instruction, power point, and written materials to support subject.  Instructors discuss benefits of exercise, components of exercise, frequency and intensity of exercise, and end points for exercise.  Also discuss use of nitroglycerin and activating EMS.  Review options of places to exercise outside of rehab.  Review guidelines for sex with heart disease.   Cardiac Drugs I:  -Group instruction provided by verbal instruction and written materials to support subject.  Instructor reviews cardiac drug classes: antiplatelets, anticoagulants, beta blockers, and statins.  Instructor discusses reasons, side effects, and lifestyle considerations for each drug class.   Cardiac Drugs II:  -Group instruction provided by verbal instruction and written materials to support subject.  Instructor reviews cardiac drug classes: angiotensin converting enzyme inhibitors (ACE-I), angiotensin II receptor blockers (ARBs), nitrates, and calcium channel blockers.  Instructor discusses reasons, side effects, and lifestyle considerations for each drug class.   CARDIAC REHAB PHASE II EXERCISE from 05/01/2017 in [place]  Date  05/01/17  Educator  Pharm D  Instruction Review Code  2- meets goals/outcomes      Anatomy and Physiology of the Circulatory System:  Group verbal and written instruction and models provide basic cardiac anatomy and physiology, with the coronary electrical  and arterial systems. Review of: AMI, Angina, Valve disease, Heart Failure, Peripheral Artery Disease, Cardiac Arrhythmia, Pacemakers, and the ICD.   Other Education:  -Group or individual verbal, written, or video instructions that support the educational goals of the cardiac rehab program.   Knowledge Questionnaire Score:     Knowledge Questionnaire Score - 04/23/17 1117      Knowledge Questionnaire Score   Pre Score 23/24      Core Components/Risk  Factors/Patient Goals at Admission:     Personal Goals and Risk Factors at Admission - 04/23/17 1011      Core Components/Risk Factors/Patient Goals on Admission    Weight Management Yes;Obesity;Weight Maintenance;Weight Loss   Intervention Weight Management: Develop a combined nutrition and exercise program designed to reach desired caloric intake, while maintaining appropriate intake of nutrient and fiber, sodium and fats, and appropriate energy expenditure required for the weight goal.;Weight Management: Provide education and appropriate resources to help participant work on and attain dietary goals.;Obesity: Provide education and appropriate resources to help participant work on and attain dietary goals.;Weight Management/Obesity: Establish reasonable short term and long term weight goals.   Admit Weight 308 lb 10.3 oz (140 kg)   Goal Weight: Short Term 305 lb 5.4 oz (138.5 kg)   Goal Weight: Long Term 300 lb (136.1 kg)   Expected Outcomes Short Term: Continue to assess and modify interventions until short term weight is achieved;Weight Maintenance: Understanding of the daily nutrition guidelines, which includes 25-35% calories from fat, 7% or less cal from saturated fats, less than 200mg  cholesterol, less than 1.5gm of sodium, & 5 or more servings of fruits and vegetables daily;Long Term: Adherence to nutrition and physical activity/exercise program aimed toward attainment of established weight goal;Weight Loss: Understanding of general recommendations for a balanced deficit meal plan, which promotes 1-2 lb weight loss per week and includes a negative energy balance of [phone] kcal/d;Understanding recommendations for meals to include 15-35% energy as protein, 25-35% energy from fat, 35-60% energy from carbohydrates, less than 200mg  of dietary cholesterol, 20-35 gm of total fiber daily;Understanding of distribution of calorie intake throughout the day with the consumption of 4-5 meals/snacks    Diabetes Yes   Intervention Provide education about signs/symptoms and action to take for hypo/hyperglycemia.;Provide education about proper nutrition, including hydration, and aerobic/resistive exercise prescription along with prescribed medications to achieve blood glucose in normal ranges: Fasting glucose 65-99 mg/dL   Expected Outcomes Short Term: Participant verbalizes understanding of the signs/symptoms and immediate care of hyper/hypoglycemia, proper foot care and importance of medication, aerobic/resistive exercise and nutrition plan for blood glucose control.   Hypertension Yes   Intervention Provide education on lifestyle modifcations including regular physical activity/exercise, weight management, moderate sodium restriction and increased consumption of fresh fruit, vegetables, and low fat dairy, alcohol moderation, and smoking cessation.;Monitor prescription use compliance.   Expected Outcomes Short Term: Continued assessment and intervention until BP is < 140/6mm HG in hypertensive participants. < 130/53mm HG in hypertensive participants with diabetes, heart failure or chronic kidney disease.;Long Term: Maintenance of blood pressure at goal levels.   Stress Yes   Intervention Offer individual and/or small group education and counseling on adjustment to heart disease, stress management and health-related lifestyle change. Teach and support self-help strategies.;Refer participants experiencing significant psychosocial distress to appropriate mental health specialists for further evaluation and treatment. When possible, include family members and significant others in education/counseling sessions.   Expected Outcomes Short Term: Participant demonstrates changes in health-related behavior, relaxation and other stress management skills,  ability to obtain effective social support, and compliance with psychotropic medications if prescribed.;Long Term: Emotional wellbeing is indicated by absence of  clinically significant psychosocial distress or social isolation.      Core Components/Risk Factors/Patient Goals Review:      Goals and Risk Factor Review    Row Name 05/07/17 1650             Core Components/Risk Factors/Patient Goals Review   Personal Goals Review Weight Management/Obesity;Diabetes;Hypertension;Stress       Review pt with multiple risk factors for CAD displays eagerness to participate in CR group exercise program.       Expected Outcomes pt will participate in CR exercise, nutrition, and lifestyle education classes to decrease overall risk factors.           Core Components/Risk Factors/Patient Goals at Discharge (Final Review):      Goals and Risk Factor Review - 05/07/17 1650      Core Components/Risk Factors/Patient Goals Review   Personal Goals Review Weight Management/Obesity;Diabetes;Hypertension;Stress   Review pt with multiple risk factors for CAD displays eagerness to participate in CR group exercise program.   Expected Outcomes pt will participate in CR exercise, nutrition, and lifestyle education classes to decrease overall risk factors.       ITP Comments:     ITP Comments    Row Name 04/23/17 0815 05/07/17 1649         ITP Comments Medical Director- Dr. Fransico Him, MD. Medical Director- Dr. Fransico Him, MD.         Comments: Pt is making expected progress toward personal goals after completing 5 sessions. Recommend continued exercise and life style modification education including  stress management and relaxation techniques to decrease cardiac risk profile.

## 2017-05-08 ENCOUNTER — Encounter (HOSPITAL_COMMUNITY)
Admission: RE | Admit: 2017-05-08 | Discharge: 2017-05-08 | Disposition: A | Discharge status: Home / Self Care | Payer: Medicare Other | Source: Ambulatory Visit | Attending: Internal Medicine | Admitting: Internal Medicine

## 2017-05-08 DIAGNOSIS — Z955 Presence of coronary angioplasty implant and graft: Secondary | ICD-10-CM

## 2017-05-08 DIAGNOSIS — Z48812 Encounter for surgical aftercare following surgery on the circulatory system: Secondary | ICD-10-CM | POA: Diagnosis not present

## 2017-05-08 LAB — GLUCOSE, CAPILLARY: Glucose-Capillary: 162 mg/dL — ABNORMAL HIGH (ref 65–99)

## 2017-05-10 ENCOUNTER — Encounter (HOSPITAL_COMMUNITY)
Admission: RE | Admit: 2017-05-10 | Discharge: 2017-05-10 | Disposition: A | Discharge status: Home / Self Care | Payer: Medicare Other | Source: Ambulatory Visit | Attending: Internal Medicine | Admitting: Internal Medicine

## 2017-05-10 DIAGNOSIS — Z955 Presence of coronary angioplasty implant and graft: Secondary | ICD-10-CM

## 2017-05-10 DIAGNOSIS — Z48812 Encounter for surgical aftercare following surgery on the circulatory system: Secondary | ICD-10-CM | POA: Diagnosis not present

## 2017-05-10 LAB — GLUCOSE, CAPILLARY: GLUCOSE-CAPILLARY: 181 mg/dL — AB (ref 65–99)

## 2017-05-13 ENCOUNTER — Encounter (HOSPITAL_COMMUNITY)
Admission: RE | Admit: 2017-05-13 | Discharge: 2017-05-13 | Disposition: A | Discharge status: Home / Self Care | Payer: Medicare Other | Source: Ambulatory Visit | Attending: Internal Medicine | Admitting: Internal Medicine

## 2017-05-13 DIAGNOSIS — Z48812 Encounter for surgical aftercare following surgery on the circulatory system: Secondary | ICD-10-CM | POA: Diagnosis not present

## 2017-05-13 DIAGNOSIS — Z955 Presence of coronary angioplasty implant and graft: Secondary | ICD-10-CM

## 2017-05-13 LAB — GLUCOSE, CAPILLARY: GLUCOSE-CAPILLARY: 230 mg/dL — AB (ref 65–99)

## 2017-05-15 ENCOUNTER — Encounter (HOSPITAL_COMMUNITY)
Admission: RE | Admit: 2017-05-15 | Discharge: 2017-05-15 | Disposition: A | Discharge status: Home / Self Care | Payer: Medicare Other | Source: Ambulatory Visit | Attending: Internal Medicine | Admitting: Internal Medicine

## 2017-05-15 DIAGNOSIS — Z955 Presence of coronary angioplasty implant and graft: Secondary | ICD-10-CM

## 2017-05-15 DIAGNOSIS — Z48812 Encounter for surgical aftercare following surgery on the circulatory system: Secondary | ICD-10-CM | POA: Diagnosis not present

## 2017-05-15 LAB — GLUCOSE, CAPILLARY: GLUCOSE-CAPILLARY: 201 mg/dL — AB (ref 65–99)

## 2017-05-15 NOTE — Progress Notes (Signed)
Reviewed home exercise with pt today.  Pt plans to use stationary bike and leg exercises to build on strength and stamina. Pt will start at 5 min. 2x/day on bike. Discussed exercise progression and safety.  Reviewed THR, pulse, RPE, sign and symptoms, NTG use, and when to call 911 or MD.  Also discussed weather considerations and indoor options.  Pt voiced understanding.    [employer], ACSM RCEP

## 2017-05-17 ENCOUNTER — Encounter (HOSPITAL_COMMUNITY)
Admission: RE | Admit: 2017-05-17 | Discharge: 2017-05-17 | Disposition: A | Discharge status: Home / Self Care | Payer: Medicare Other | Source: Ambulatory Visit | Attending: Internal Medicine | Admitting: Internal Medicine

## 2017-05-17 DIAGNOSIS — Z955 Presence of coronary angioplasty implant and graft: Secondary | ICD-10-CM

## 2017-05-17 DIAGNOSIS — Z48812 Encounter for surgical aftercare following surgery on the circulatory system: Secondary | ICD-10-CM | POA: Diagnosis not present

## 2017-05-17 LAB — GLUCOSE, CAPILLARY: GLUCOSE-CAPILLARY: 161 mg/dL — AB (ref 65–99)

## 2017-05-20 ENCOUNTER — Encounter (HOSPITAL_COMMUNITY)
Admission: RE | Admit: 2017-05-20 | Discharge: 2017-05-20 | Disposition: A | Discharge status: Home / Self Care | Payer: Medicare Other | Source: Ambulatory Visit | Attending: Internal Medicine | Admitting: Internal Medicine

## 2017-05-20 DIAGNOSIS — Z955 Presence of coronary angioplasty implant and graft: Secondary | ICD-10-CM | POA: Diagnosis present

## 2017-05-20 DIAGNOSIS — Z48812 Encounter for surgical aftercare following surgery on the circulatory system: Secondary | ICD-10-CM | POA: Insufficient documentation

## 2017-05-20 LAB — GLUCOSE, CAPILLARY: Glucose-Capillary: 250 mg/dL — ABNORMAL HIGH (ref 65–99)

## 2017-05-24 ENCOUNTER — Encounter (HOSPITAL_COMMUNITY)
Admission: RE | Admit: 2017-05-24 | Discharge: 2017-05-24 | Disposition: A | Discharge status: Home / Self Care | Payer: Medicare Other | Source: Ambulatory Visit | Attending: Internal Medicine | Admitting: Internal Medicine

## 2017-05-24 DIAGNOSIS — Z48812 Encounter for surgical aftercare following surgery on the circulatory system: Secondary | ICD-10-CM | POA: Diagnosis not present

## 2017-05-24 DIAGNOSIS — Z955 Presence of coronary angioplasty implant and graft: Secondary | ICD-10-CM

## 2017-05-24 LAB — GLUCOSE, CAPILLARY: GLUCOSE-CAPILLARY: 147 mg/dL — AB (ref 65–99)

## 2017-05-25 ENCOUNTER — Other Ambulatory Visit: Payer: Self-pay | Admitting: Internal Medicine

## 2017-05-25 DIAGNOSIS — I10 Essential (primary) hypertension: Secondary | ICD-10-CM

## 2017-05-25 DIAGNOSIS — E1165 Type 2 diabetes mellitus with hyperglycemia: Secondary | ICD-10-CM

## 2017-05-27 ENCOUNTER — Encounter (HOSPITAL_COMMUNITY): Payer: Medicare Other

## 2017-05-29 ENCOUNTER — Encounter (HOSPITAL_COMMUNITY)
Admission: RE | Admit: 2017-05-29 | Discharge: 2017-05-29 | Disposition: A | Discharge status: Home / Self Care | Payer: Medicare Other | Source: Ambulatory Visit | Attending: Internal Medicine | Admitting: Internal Medicine

## 2017-05-29 DIAGNOSIS — Z955 Presence of coronary angioplasty implant and graft: Secondary | ICD-10-CM

## 2017-05-29 DIAGNOSIS — Z48812 Encounter for surgical aftercare following surgery on the circulatory system: Secondary | ICD-10-CM | POA: Diagnosis not present

## 2017-05-29 LAB — GLUCOSE, CAPILLARY: GLUCOSE-CAPILLARY: 146 mg/dL — AB (ref 65–99)

## 2017-05-30 ENCOUNTER — Other Ambulatory Visit: Payer: Self-pay | Admitting: Internal Medicine

## 2017-05-30 DIAGNOSIS — I10 Essential (primary) hypertension: Secondary | ICD-10-CM

## 2017-05-30 DIAGNOSIS — E1165 Type 2 diabetes mellitus with hyperglycemia: Secondary | ICD-10-CM

## 2017-05-31 ENCOUNTER — Encounter (HOSPITAL_COMMUNITY)
Admission: RE | Admit: 2017-05-31 | Discharge: 2017-05-31 | Disposition: A | Discharge status: Home / Self Care | Payer: Medicare Other | Source: Ambulatory Visit | Attending: Internal Medicine | Admitting: Internal Medicine

## 2017-05-31 DIAGNOSIS — Z955 Presence of coronary angioplasty implant and graft: Secondary | ICD-10-CM

## 2017-05-31 DIAGNOSIS — Z48812 Encounter for surgical aftercare following surgery on the circulatory system: Secondary | ICD-10-CM | POA: Diagnosis not present

## 2017-05-31 LAB — GLUCOSE, CAPILLARY: Glucose-Capillary: 201 mg/dL — ABNORMAL HIGH (ref 65–99)

## 2017-06-03 ENCOUNTER — Encounter (HOSPITAL_COMMUNITY)
Admission: RE | Admit: 2017-06-03 | Discharge: 2017-06-03 | Disposition: A | Discharge status: Home / Self Care | Payer: Medicare Other | Source: Ambulatory Visit | Attending: Internal Medicine | Admitting: Internal Medicine

## 2017-06-03 ENCOUNTER — Ambulatory Visit: Payer: BC Managed Care – PPO | Admitting: Internal Medicine

## 2017-06-03 DIAGNOSIS — Z955 Presence of coronary angioplasty implant and graft: Secondary | ICD-10-CM

## 2017-06-03 DIAGNOSIS — Z48812 Encounter for surgical aftercare following surgery on the circulatory system: Secondary | ICD-10-CM | POA: Diagnosis not present

## 2017-06-03 LAB — GLUCOSE, CAPILLARY: Glucose-Capillary: 239 mg/dL — ABNORMAL HIGH (ref 65–99)

## 2017-06-04 NOTE — Progress Notes (Signed)
Cardiac Individual Treatment Plan  Patient Details  Name: Alicia [name] MRN: [id number] Date of Birth: [birth year] Referring Provider:     CARDIAC REHAB PHASE II ORIENTATION from 04/23/2017 in [facility]  Referring Provider  [name], MD.      Initial Encounter Date:    CARDIAC REHAB PHASE II ORIENTATION from 04/23/2017 in [place]  Date  04/23/17  Referring Provider  [name], MD.      Visit Diagnosis: 03/18/17 DES D1  Patient's Home Medications on Admission:  Current Outpatient Prescriptions:  .  acetaminophen (TYLENOL) 325 MG tablet, Take 2 tablets (650 mg total) by mouth every 4 (four) hours as needed for headache or mild pain., Disp: , Rfl:  .  amLODipine (NORVASC) 5 MG tablet, Take 1 tablet (5 mg total) by mouth daily., Disp: 90 tablet, Rfl: 3 .  aspirin 81 MG tablet, Take 81 mg by mouth daily., Disp: , Rfl:  .  atorvastatin (LIPITOR) 80 MG tablet, TAKE 1 TABLET BY MOUTH EVERY EVENING, Disp: 90 tablet, Rfl: 1 .  carvedilol (COREG) 25 MG tablet, Take 1 tablet (25 mg total) by mouth 2 (two) times daily., Disp: 180 tablet, Rfl: 3 .  clopidogrel (PLAVIX) 75 MG tablet, TAKE 1 TABLET (75 MG TOTAL) BY MOUTH DAILY., Disp: 90 tablet, Rfl: 2 .  DiphenhydrAMINE HCl, Sleep, (UNISOM SLEEPGELS PO), Take 1 tablet by mouth at bedtime as needed (SLEEP)., Disp: , Rfl:  .  doxazosin (CARDURA) 4 MG tablet, TAKE 1 TABLET (4 MG TOTAL) BY MOUTH AT BEDTIME., Disp: 90 tablet, Rfl: 0 .  ferrous sulfate 325 (65 FE) MG tablet, TAKE 1 TABLET (325 MG TOTAL) BY MOUTH 3 (THREE) TIMES DAILY WITH MEALS., Disp: 90 tablet, Rfl: 3 .  furosemide (LASIX) 20 MG tablet, TAKE 1 TABLET (20 MG TOTAL) BY MOUTH DAILY., Disp: 90 tablet, Rfl: 1 .  glipiZIDE (GLUCOTROL) 10 MG tablet, TAKE 1 TABLET (10 MG TOTAL) BY MOUTH 2 (TWO) TIMES DAILY BEFORE A MEAL., Disp: 180 tablet, Rfl: 0 .  isosorbide mononitrate (IMDUR) 30 MG 24 hr tablet, Take 1 tablet (30 mg total)  by mouth daily., Disp: 90 tablet, Rfl: 3 .  liraglutide 18 MG/3ML SOPN, Inject 0.2 mLs (1.2 mg total) into the skin daily, Disp: 6 pen, Rfl: 1 .  losartan (COZAAR) 100 MG tablet, TAKE 1 TABLET (100 MG TOTAL) BY MOUTH DAILY., Disp: 90 tablet, Rfl: 0 .  metFORMIN (GLUCOPHAGE) 1000 MG tablet, Take 1 tablet (1,000 mg total) by mouth 2 (two) times daily with a meal., Disp: 180 tablet, Rfl: 1 .  Multiple Vitamins-Minerals (MULTIVITAMIN WITH MINERALS) tablet, Take 1 tablet by mouth daily., Disp: , Rfl:  .  nitroGLYCERIN (NITROSTAT) 0.4 MG SL tablet, Place 1 tablet (0.4 mg total) under the tongue every 5 (five) minutes as needed for chest pain (up to 3 doses)., Disp: 25 tablet, Rfl: 3 .  spironolactone (ALDACTONE) 25 MG tablet, TAKE 1 TABLET (25 MG TOTAL) BY MOUTH DAILY., Disp: 90 tablet, Rfl: 0  Past Medical History: Past Medical History:  Diagnosis Date  . Anemia   . Arthritis    "right knee" (10/21/2015)  . CAD (coronary artery disease)    a. Cath 10/21/15: s/p DES to RCA: moderate diffuse stenosis of mod diagonal, diffuse irregularity LCx and LAD. 2D echo 10/21/15: mod LVH, EF 60-65%, no RWMA, calcified MV, mod-severe LAE. 03/18/17 PCI with DES--> 1 diag  . Cataract   . Essential hypertension   .  Family history of adverse reaction to anesthesia    "oldest sister, [name], related to brain formation at back of head; when they put her to sleep it's hard to wake her up"  . GERD (gastroesophageal reflux disease)   . History of blood transfusion    "related to ruptured tubal pregnancy"  . Hyperlipidemia   . Hypertensive heart disease   . Morbid obesity ([place])   . Type II diabetes mellitus ([facility])     Tobacco Use: History  Smoking Status  . Never Smoker  Smokeless Tobacco  . Never Used    Labs: Recent Review Flowsheet Data    Labs for ITP Cardiac and Pulmonary Rehab Latest Ref Rng & Units 01/06/2016 08/10/2016 08/13/2016 11/15/2016 02/26/2017   Cholestrol 0 - 200 mg/dL 125 135 - 150 168   LDLCALC  0 - 99 mg/dL 75 77 - 80 101(H)   HDL >39.00 mg/dL 42(L) 43(L) - 42.70 43.30   Trlycerides 0.0 - 149.0 mg/dL 38 77 - 134.0 122.0   Hemoglobin A1c 4.6 - 6.5 % - - 14.1 Repeated and verified X2.(H) 15.3(H) 14.1(H)   PHART 7.350 - 7.450 - - - - -   PCO2ART 35.0 - 45.0 mmHg - - - - -   HCO3 20.0 - 24.0 mEq/L - - - - -   TCO2 0 - 100 mmol/L - - - - -   O2SAT % - - - - -      Capillary Blood Glucose: Lab Results  Component Value Date   GLUCAP 239 (H) 06/03/2017   GLUCAP 201 (H) 05/31/2017   GLUCAP 146 (H) 05/29/2017   GLUCAP 147 (H) 05/24/2017   GLUCAP 250 (H) 05/20/2017     Exercise Target Goals:    Exercise Program Goal: Individual exercise prescription set with THRR, safety & activity barriers. Participant demonstrates ability to understand and report RPE using BORG scale, to self-measure pulse accurately, and to acknowledge the importance of the exercise prescription.  Exercise Prescription Goal: Starting with aerobic activity 30 plus minutes a day, 3 days per week for initial exercise prescription. Provide home exercise prescription and guidelines that participant acknowledges understanding prior to discharge.  Activity Barriers & Risk Stratification:     Activity Barriers & Cardiac Risk Stratification - 04/23/17 1115      Activity Barriers & Cardiac Risk Stratification   Activity Barriers Arthritis;Other (comment)   Comments Swelling in left leg and foot.   Cardiac Risk Stratification High      6 Minute Walk:     6 Minute Walk    Row Name 04/23/17 1114         6 Minute Walk   Phase Initial     Distance 878 feet     Walk Time 6 minutes     # of Rest Breaks 2     MPH 1.66     METS 1.45     RPE 15     VO2 Peak 5.07     Symptoms No     Resting HR 86 bpm     Resting BP 116/78     Max Ex. HR 125 bpm     Max Ex. BP 164/80     2 Minute Post BP 128/80        Oxygen Initial Assessment:   Oxygen Re-Evaluation:   Oxygen Discharge (Final Oxygen  Re-Evaluation):   Initial Exercise Prescription:     Initial Exercise Prescription - 04/23/17 1100      Date of Initial  Exercise RX and Referring Provider   Date 04/23/17   Referring Provider [name], MD.     Recumbant Bike   Level 1   Watts 15   Minutes 15   METs 2.15     NuStep   Level 2   SPM 85   Minutes 15   METs 2.2     Track   Laps 10   Minutes 15   METs 2.16     Prescription Details   Frequency (times per week) 3   Duration Progress to 30 minutes of continuous aerobic without signs/symptoms of physical distress     Intensity   THRR 40-80% of Max Heartrate 60-121   Ratings of Perceived Exertion 11-13   Perceived Dyspnea 0-4     Progression   Progression Continue to progress workloads to maintain intensity without signs/symptoms of physical distress.     Resistance Training   Training Prescription Yes   Weight 2lb wts.   Reps 10-15      Perform Capillary Blood Glucose checks as needed.  Exercise Prescription Changes:     Exercise Prescription Changes    Row Name 05/06/17 1600 06/04/17 1200           Response to Exercise   Blood Pressure (Admit) 136/80 128/82      Blood Pressure (Exercise) 140/80 134/76      Blood Pressure (Exit) 122/66 130/80      Heart Rate (Admit) 90 bpm 68 bpm      Heart Rate (Exercise) 109 bpm 122 bpm      Heart Rate (Exit) 90 bpm 67 bpm      Rating of Perceived Exertion (Exercise) 13 14      Symptoms none none      Comments Pt was oriented to exercise equipment 04/29/17 Pt was oriented to exercise equipment 04/29/17      Duration Continue with 30 min of aerobic exercise without signs/symptoms of physical distress. Continue with 30 min of aerobic exercise without signs/symptoms of physical distress.      Intensity THRR unchanged THRR unchanged        Progression   Progression Continue to progress workloads to maintain intensity without signs/symptoms of physical distress. Continue to progress workloads to maintain  intensity without signs/symptoms of physical distress.      Average METs 1.8 1.8        Resistance Training   Training Prescription Yes Yes      Weight 1lb 2lbs      Reps 10-15 10-15      Time 10 Minutes 10 Minutes        Recumbant Bike   Level 2  -      Watts 15  -      Minutes 15  -      METs 1.9  -        NuStep   Level 2 4      SPM 80 80      Minutes 15 15      METs 1.7 1.9        Track   Laps  - 7      Minutes  - 15      METs  - 1.6        Home Exercise Plan   Plans to continue exercise at  - Home (comment)      Frequency  - Add 2 additional days to program exercise sessions.      Initial Home Exercises Provided  -  05/15/17         Exercise Comments:     Exercise Comments    Row Name 05/06/17 1634 06/04/17 1240         Exercise Comments Pt was oriented to exercise eqipment on 04/29/17. Pt is tolerating exercise well; will continue to monitor activity levels and exercise progression. Reviewed METs and goals. Pt is tolerating exercise well; will continue to monitor exercise progression.         Exercise Goals and Review:     Exercise Goals    Row Name 04/23/17 1116             Exercise Goals   Increase Physical Activity Yes       Intervention Provide advice, education, support and counseling about physical activity/exercise needs.;Develop an individualized exercise prescription for aerobic and resistive training based on initial evaluation findings, risk stratification, comorbidities and participant's personal goals.       Expected Outcomes Achievement of increased cardiorespiratory fitness and enhanced flexibility, muscular endurance and strength shown through measurements of functional capacity and personal statement of participant.       Increase Strength and Stamina Yes       Intervention Provide advice, education, support and counseling about physical activity/exercise needs.;Develop an individualized exercise prescription for aerobic and resistive  training based on initial evaluation findings, risk stratification, comorbidities and participant's personal goals.       Expected Outcomes Achievement of increased cardiorespiratory fitness and enhanced flexibility, muscular endurance and strength shown through measurements of functional capacity and personal statement of participant.          Exercise Goals Re-Evaluation :     Exercise Goals Re-Evaluation    Row Name 05/15/17 1632 06/04/17 1742           Exercise Goal Re-Evaluation   Exercise Goals Review Increase Strenth and Stamina;Increase Physical Activity  -      Comments Reviewed home exercise with pt today.  Pt plans to use stationary bike and leg exercises to build on strength and stamina. Pt will start at 5 min. 2x/day on bike. Discussed exercise progression and safety.  Reviewed THR, pulse, RPE, sign and symptoms, NTG use, and when to call 911 or MD.  Also discussed weather considerations and indoor options.  Pt voiced understanding.  Pt plans to use stationary bike and leg exercises to build on strength and stamina. Pt will start at 5 min. 2x/day on bike.       Expected Outcomes Pt will build on aerobic capacity and overall functional mobility. Pt will build on aerobic capacity and overall functional mobility.          Discharge Exercise Prescription (Final Exercise Prescription Changes):     Exercise Prescription Changes - 06/04/17 1200      Response to Exercise   Blood Pressure (Admit) 128/82   Blood Pressure (Exercise) 134/76   Blood Pressure (Exit) 130/80   Heart Rate (Admit) 68 bpm   Heart Rate (Exercise) 122 bpm   Heart Rate (Exit) 67 bpm   Rating of Perceived Exertion (Exercise) 14   Symptoms none   Comments Pt was oriented to exercise equipment 04/29/17   Duration Continue with 30 min of aerobic exercise without signs/symptoms of physical distress.   Intensity THRR unchanged     Progression   Progression Continue to progress workloads to maintain intensity  without signs/symptoms of physical distress.   Average METs 1.8     Resistance Training   Training Prescription Yes  Weight 2lbs   Reps 10-15   Time 10 Minutes     NuStep   Level 4   SPM 80   Minutes 15   METs 1.9     Track   Laps 7   Minutes 15   METs 1.6     Home Exercise Plan   Plans to continue exercise at Home (comment)   Frequency Add 2 additional days to program exercise sessions.   Initial Home Exercises Provided 05/15/17      Nutrition:  Target Goals: Understanding of nutrition guidelines, daily intake of sodium 1500mg , cholesterol 200mg , calories 30% from fat and 7% or less from saturated fats, daily to have 5 or more servings of fruits and vegetables.  Biometrics:     Pre Biometrics - 04/23/17 1009      Pre Biometrics   Height 5' 5.25" (1.657 m)   Waist Circumference 49 inches   Hip Circumference 62 inches   Waist to Hip Ratio 0.79 %   BMI (Calculated) 51.1   Triceps Skinfold 67 mm   % Body Fat 61.2 %   Grip Strength 26 kg   Flexibility 10 in   Single Leg Stand 7.03 seconds       Nutrition Therapy Plan and Nutrition Goals:     Nutrition Therapy & Goals - 04/30/17 1523      Nutrition Therapy   Diet Carb Modified, Therapeutic Lifestyle Changes     Personal Nutrition Goals   Nutrition Goal Wt loss of 1-2 lb/week to a wt loss goal of 6-24 lb at graduation from [facility], educate and counsel regarding individualized specific dietary modifications aiming towards targeted core components such as weight, hypertension, lipid management, diabetes, heart failure and other comorbidities.   Expected Outcomes Short Term Goal: Understand basic principles of dietary content, such as calories, fat, sodium, cholesterol and nutrients.;Long Term Goal: Adherence to prescribed nutrition plan.      Nutrition Discharge: Nutrition Scores:     Nutrition Assessments - 04/30/17 1524      MEDFICTS Scores    Pre Score 32      Nutrition Goals Re-Evaluation:   Nutrition Goals Re-Evaluation:   Nutrition Goals Discharge (Final Nutrition Goals Re-Evaluation):   Psychosocial: Target Goals: Acknowledge presence or absence of significant depression and/or stress, maximize coping skills, provide positive support system. Participant is able to verbalize types and ability to use techniques and skills needed for reducing stress and depression.  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  unfortunately her sister's failing health is making home caregiving more challenging.  pt also expresses concern about progression of her CAD     Family Dynamics   Good Support System? Yes  sister, [name] and her 3 children     Barriers   Psychosocial barriers to participate in program The patient should benefit from training in stress management and relaxation.     Screening Interventions   Interventions Encouraged to exercise;Provide feedback about the scores to participant;To provide support and resources with identified psychosocial needs      Quality of Life Scores:     Quality of Life - 05/06/17 1617      Quality of Life  Scores   Health/Function Pre 18.11 %  pt with multiple health related concerns. pt with decreased strength/stamina experiences dyspnea with climbing stairs or walking in hot temperatures.  pt reports her stamina is improving she is now able to sweep and mop however has difficulty dusting.     Socioeconomic Pre 26.64 %   Psych/Spiritual Pre 28 %   Family Pre 22.5 %   GLOBAL Pre 22.69 %  pt given emotional support and reassurance.        PHQ-9: Recent Review Flowsheet Data    Depression screen [facility] 2/9 04/30/2017 10/16/2016 03/16/2016 11/23/2015  10/10/2015   Decreased Interest 0 0 0 0 0   Down, Depressed, Hopeless 1  0 0 0 0   PHQ - 2 Score 1 0 0 0 0     Interpretation of Total Score  Total Score Depression Severity:  1-4 = Minimal depression, 5-9 = Mild depression, 10-14 = Moderate depression, 15-19 = Moderately severe depression, 20-27 = Severe depression   Psychosocial Evaluation and Intervention:     Psychosocial Evaluation - 04/30/17 0705      Psychosocial Evaluation & Interventions   Interventions Stress management education;Relaxation education;Encouraged to exercise with the program and follow exercise prescription   Comments pt will need stress management and taking care of self tools to reduce caregiver fatigue.    Expected Outcomes pt will exhibit positive outlook with good coping skills.    Continue Psychosocial Services  No Follow up required      Psychosocial Re-Evaluation:     Psychosocial Re-Evaluation    [place] Name 05/07/17 1651 06/03/17 1633           Psychosocial Re-Evaluation   Current issues with Current Stress Concerns;History of Depression Current Stress Concerns;History of Depression      Comments pt exhibits caregiver stress and fatigue. pt encouraged to participate in self care activities.   pt exhibits caregiver stress and fatigue. pt encouraged to participate in self care activities.        Expected Outcomes pt will demonstrate positive outlook with good coping skills and self care managment.  pt will demonstrate positive outlook with good coping skills and self care managment.       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  unfortunately her  sister's failing health is making home caregiving more challenging.  pt also expresses concern about progression of her CAD        Initial Review   Source of Stress Concerns Chronic Illness;Family Chronic Illness;Family         Psychosocial Discharge (Final Psychosocial Re-Evaluation):     Psychosocial Re-Evaluation - 06/03/17 1633      Psychosocial Re-Evaluation   Current issues with Current Stress Concerns;History of Depression   Comments pt exhibits caregiver stress and fatigue. pt encouraged to participate in self care activities.     Expected Outcomes pt will demonstrate positive outlook with good coping skills and self care managment.    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  unfortunately her sister's failing  health is making home caregiving more challenging.  pt also expresses concern about progression of her CAD     Initial Review   Source of Stress Concerns Chronic Illness;Family      Vocational Rehabilitation: Provide vocational rehab assistance to qualifying candidates.   Vocational Rehab Evaluation & Intervention:     Vocational Rehab - 04/23/17 1209      Initial Vocational Rehab Evaluation & Intervention   Assessment shows need for Vocational Rehabilitation No  retired       Education: Education Goals: Education classes will be provided on a weekly basis, covering required topics. Participant will state understanding/return demonstration of topics presented.  Learning Barriers/Preferences:     Learning Barriers/Preferences - 04/23/17 1154      Learning Barriers/Preferences   Learning Barriers Sight   Learning Preferences Written Material      Education Topics: Count Your Pulse:  -Group instruction provided by verbal instruction,  demonstration, patient participation and written materials to support subject.  Instructors address importance of being able to find your pulse and how to count your pulse when at home without a heart monitor.  Patients get hands on experience counting their pulse with staff help and individually.   Heart Attack, Angina, and Risk Factor Modification:  -Group instruction provided by verbal instruction, video, and written materials to support subject.  Instructors address signs and symptoms of angina and heart attacks.    Also discuss risk factors for heart disease and how to make changes to improve heart health risk factors.   Functional Fitness:  -Group instruction provided by verbal instruction, demonstration, patient participation, and written materials to support subject.  Instructors address safety measures for doing things around the house.  Discuss how to get up and down off the floor, how to pick things up properly, how to safely get out of a chair without assistance, and balance training.   Meditation and Mindfulness:  -Group instruction provided by verbal instruction, patient participation, and written materials to support subject.  Instructor addresses importance of mindfulness and meditation practice to help reduce stress and improve awareness.  Instructor also leads participants through a meditation exercise.    Stretching for Flexibility and Mobility:  -Group instruction provided by verbal instruction, patient participation, and written materials to support subject.  Instructors lead participants through series of stretches that are designed to increase flexibility thus improving mobility.  These stretches are additional exercise for major muscle groups that are typically performed during regular warm up and cool down.   Hands Only CPR:  -Group verbal, video, and participation provides a basic overview of AHA guidelines for community CPR. Role-play of emergencies allow participants  the opportunity to practice calling for help and chest compression technique with discussion of AED use.   Hypertension: -Group verbal and written instruction that provides a basic overview of hypertension including the most recent diagnostic guidelines, risk factor reduction with self-care instructions and medication management.    Nutrition I class: Heart Healthy Eating:  -Group instruction provided by PowerPoint slides, verbal discussion, and written materials to support subject matter. The instructor gives an explanation and review of the Therapeutic Lifestyle Changes diet recommendations, which includes a discussion on lipid goals, dietary fat, sodium, fiber, plant stanol/sterol esters, sugar, and the components of a well-balanced, healthy diet.   Nutrition II class: Lifestyle Skills:  -Group instruction provided by PowerPoint slides, verbal discussion, and written materials to support subject matter. The instructor gives an explanation and review of label reading, grocery shopping for  heart health, heart healthy recipe modifications, and ways to make healthier choices when eating out.   Diabetes Question & Answer:  -Group instruction provided by PowerPoint slides, verbal discussion, and written materials to support subject matter. The instructor gives an explanation and review of diabetes co-morbidities, pre- and post-prandial blood glucose goals, pre-exercise blood glucose goals, signs, symptoms, and treatment of hypoglycemia and hyperglycemia, and foot care basics.   CARDIAC REHAB PHASE II EXERCISE from 05/31/2017 in [place]  Date  05/31/17  Educator  RD  Instruction Review Code  2- meets goals/outcomes      Diabetes Blitz:  -Group instruction provided by PowerPoint slides, verbal discussion, and written materials to support subject matter. The instructor gives an explanation and review of the physiology behind type 1 and type 2 diabetes, diabetes  medications and rational behind using different medications, pre- and post-prandial blood glucose recommendations and Hemoglobin A1c goals, diabetes diet, and exercise including blood glucose guidelines for exercising safely.    Portion Distortion:  -Group instruction provided by PowerPoint slides, verbal discussion, written materials, and food models to support subject matter. The instructor gives an explanation of serving size versus portion size, changes in portions sizes over the last 20 years, and what consists of a serving from each food group.   CARDIAC REHAB PHASE II EXERCISE from 05/31/2017 in [place]  Date  05/08/17  Educator  RD  Instruction Review Code  2- meets goals/outcomes      Stress Management:  -Group instruction provided by verbal instruction, video, and written materials to support subject matter.  Instructors review role of stress in heart disease and how to cope with stress positively.     CARDIAC REHAB PHASE II EXERCISE from 05/31/2017 in [place]  Date  05/15/17  Instruction Review Code  2- meets goals/outcomes      Exercising on Your Own:  -Group instruction provided by verbal instruction, power point, and written materials to support subject.  Instructors discuss benefits of exercise, components of exercise, frequency and intensity of exercise, and end points for exercise.  Also discuss use of nitroglycerin and activating EMS.  Review options of places to exercise outside of rehab.  Review guidelines for sex with heart disease.   Cardiac Drugs I:  -Group instruction provided by verbal instruction and written materials to support subject.  Instructor reviews cardiac drug classes: antiplatelets, anticoagulants, beta blockers, and statins.  Instructor discusses reasons, side effects, and lifestyle considerations for each drug class.   Cardiac Drugs II:  -Group instruction provided by verbal  instruction and written materials to support subject.  Instructor reviews cardiac drug classes: angiotensin converting enzyme inhibitors (ACE-I), angiotensin II receptor blockers (ARBs), nitrates, and calcium channel blockers.  Instructor discusses reasons, side effects, and lifestyle considerations for each drug class.   CARDIAC REHAB PHASE II EXERCISE from 05/31/2017 in [place]  Date  05/01/17  Educator  Pharm D  Instruction Review Code  2- meets goals/outcomes      Anatomy and Physiology of the Circulatory System:  Group verbal and written instruction and models provide basic cardiac anatomy and physiology, with the coronary electrical and arterial systems. Review of: AMI, Angina, Valve disease, Heart Failure, Peripheral Artery Disease, Cardiac Arrhythmia, Pacemakers, and the ICD.   Other Education:  -Group or individual verbal, written, or video instructions that support the educational goals of the cardiac rehab program.   Knowledge Questionnaire  Score:     Knowledge Questionnaire Score - 04/23/17 1117      Knowledge Questionnaire Score   Pre Score 23/24      Core Components/Risk Factors/Patient Goals at Admission:     Personal Goals and Risk Factors at Admission - 04/23/17 1011      Core Components/Risk Factors/Patient Goals on Admission    Weight Management Yes;Obesity;Weight Maintenance;Weight Loss   Intervention Weight Management: Develop a combined nutrition and exercise program designed to reach desired caloric intake, while maintaining appropriate intake of nutrient and fiber, sodium and fats, and appropriate energy expenditure required for the weight goal.;Weight Management: Provide education and appropriate resources to help participant work on and attain dietary goals.;Obesity: Provide education and appropriate resources to help participant work on and attain dietary goals.;Weight Management/Obesity: Establish reasonable short term and  long term weight goals.   Admit Weight 308 lb 10.3 oz (140 kg)   Goal Weight: Short Term 305 lb 5.4 oz (138.5 kg)   Goal Weight: Long Term 300 lb (136.1 kg)   Expected Outcomes Short Term: Continue to assess and modify interventions until short term weight is achieved;Weight Maintenance: Understanding of the daily nutrition guidelines, which includes 25-35% calories from fat, 7% or less cal from saturated fats, less than 200mg  cholesterol, less than 1.5gm of sodium, & 5 or more servings of fruits and vegetables daily;Long Term: Adherence to nutrition and physical activity/exercise program aimed toward attainment of established weight goal;Weight Loss: Understanding of general recommendations for a balanced deficit meal plan, which promotes 1-2 lb weight loss per week and includes a negative energy balance of [phone] kcal/d;Understanding recommendations for meals to include 15-35% energy as protein, 25-35% energy from fat, 35-60% energy from carbohydrates, less than 200mg  of dietary cholesterol, 20-35 gm of total fiber daily;Understanding of distribution of calorie intake throughout the day with the consumption of 4-5 meals/snacks   Diabetes Yes   Intervention Provide education about signs/symptoms and action to take for hypo/hyperglycemia.;Provide education about proper nutrition, including hydration, and aerobic/resistive exercise prescription along with prescribed medications to achieve blood glucose in normal ranges: Fasting glucose 65-99 mg/dL   Expected Outcomes Short Term: Participant verbalizes understanding of the signs/symptoms and immediate care of hyper/hypoglycemia, proper foot care and importance of medication, aerobic/resistive exercise and nutrition plan for blood glucose control.   Hypertension Yes   Intervention Provide education on lifestyle modifcations including regular physical activity/exercise, weight management, moderate sodium restriction and increased consumption of fresh fruit,  vegetables, and low fat dairy, alcohol moderation, and smoking cessation.;Monitor prescription use compliance.   Expected Outcomes Short Term: Continued assessment and intervention until BP is < 140/23mm HG in hypertensive participants. < 130/56mm HG in hypertensive participants with diabetes, heart failure or chronic kidney disease.;Long Term: Maintenance of blood pressure at goal levels.   Stress Yes   Intervention Offer individual and/or small group education and counseling on adjustment to heart disease, stress management and health-related lifestyle change. Teach and support self-help strategies.;Refer participants experiencing significant psychosocial distress to appropriate mental health specialists for further evaluation and treatment. When possible, include family members and significant others in education/counseling sessions.   Expected Outcomes Short Term: Participant demonstrates changes in health-related behavior, relaxation and other stress management skills, ability to obtain effective social support, and compliance with psychotropic medications if prescribed.;Long Term: Emotional wellbeing is indicated by absence of clinically significant psychosocial distress or social isolation.      Core Components/Risk Factors/Patient Goals Review:      Goals and Risk Factor  Review    Row Name 05/07/17 1650 06/03/17 1632           Core Components/Risk Factors/Patient Goals Review   Personal Goals Review Weight Management/Obesity;Diabetes;Hypertension;Stress Weight Management/Obesity;Diabetes;Hypertension;Stress      Review pt with multiple risk factors for CAD displays eagerness to participate in CR group exercise program. pt with multiple risk factors for CAD displays eagerness to participate in CR group exercise program.  pt is pleased with her increased ability to perform household activities. pt is especially excited to spend this week with her 64yo granddaughter.       Expected Outcomes  pt will participate in CR exercise, nutrition, and lifestyle education classes to decrease overall risk factors.  pt will participate in CR exercise, nutrition, and lifestyle education classes to decrease overall risk factors.          Core Components/Risk Factors/Patient Goals at Discharge (Final Review):      Goals and Risk Factor Review - 06/03/17 1632      Core Components/Risk Factors/Patient Goals Review   Personal Goals Review Weight Management/Obesity;Diabetes;Hypertension;Stress   Review pt with multiple risk factors for CAD displays eagerness to participate in CR group exercise program.  pt is pleased with her increased ability to perform household activities. pt is especially excited to spend this week with her 46yo granddaughter.    Expected Outcomes pt will participate in CR exercise, nutrition, and lifestyle education classes to decrease overall risk factors.       ITP Comments:     ITP Comments    Row Name 04/23/17 0815 05/07/17 1649 06/03/17 1632       ITP Comments Medical Director- Dr. Fransico Him, MD. Medical Director- Dr. Fransico Him, MD. Medical Director- Dr. Fransico Him, MD.        Comments: Pt is making expected progress toward personal goals after completing  15sessions. Recommend continued exercise and life style modification education including  stress management and relaxation techniques to decrease cardiac risk profile.

## 2017-06-05 ENCOUNTER — Encounter (HOSPITAL_COMMUNITY)
Admission: RE | Admit: 2017-06-05 | Discharge: 2017-06-05 | Disposition: A | Discharge status: Home / Self Care | Payer: Medicare Other | Source: Ambulatory Visit | Attending: Internal Medicine | Admitting: Internal Medicine

## 2017-06-05 DIAGNOSIS — Z955 Presence of coronary angioplasty implant and graft: Secondary | ICD-10-CM

## 2017-06-06 ENCOUNTER — Encounter: Payer: Self-pay | Admitting: Internal Medicine

## 2017-06-06 ENCOUNTER — Ambulatory Visit (INDEPENDENT_AMBULATORY_CARE_PROVIDER_SITE_OTHER): Payer: Medicare Other | Admitting: Internal Medicine

## 2017-06-06 VITALS — BP 118/70 | HR 81 | Temp 98.1°F | Wt 305.0 lb

## 2017-06-06 DIAGNOSIS — E1165 Type 2 diabetes mellitus with hyperglycemia: Secondary | ICD-10-CM | POA: Diagnosis not present

## 2017-06-06 DIAGNOSIS — I10 Essential (primary) hypertension: Secondary | ICD-10-CM

## 2017-06-06 LAB — HEMOGLOBIN A1C: HEMOGLOBIN A1C: 8.1 % — AB (ref 4.6–6.5)

## 2017-06-06 MED ORDER — FERROUS SULFATE 325 (65 FE) MG PO TABS: ORAL_TABLET | ORAL | 3 refills | Status: DC

## 2017-06-06 NOTE — Assessment & Plan Note (Signed)
Better controlled now She will continue current medications She will continue to follow with Dr. Harrington Challenger

## 2017-06-06 NOTE — Assessment & Plan Note (Signed)
A1C today Microalbumin not needed secondary to ACEI therapy Advised her to start checking her sugars at least daily Encouraged her to consume a balanced diet and exercise regimen daily She declines vaccines today Foot exam UTD Encouraged her to continue yearly eye exams

## 2017-06-06 NOTE — Progress Notes (Signed)
Subjective:    Patient ID: Alicia [name], female    DOB: 21-Mar-1946, 71 y.o.   MRN: [id number]  HPI  Pt presents to the clinic today to follow up DM 2. Her last A1C was 14.1%. She reports she had been taking her Metformin and Glipizide (complinace questioned). She was restarted on Victoza. She reports she has been taking all 3 medications as prescribed. She does not check her sugars. Her last eye exam 03/2017. She checks her feet daily.  Of note, her BP was 154/100. She is taking Lasix, Spirnolactone, Doxazosin, Carvedilol, Amlodipine and Losartan. She refused to add additional medications at her last visit. She follows with Dr. Harrington Challenger. Her BP today is 118/70.  Review of Systems      Past Medical History:  Diagnosis Date  . Anemia   . Arthritis    "right knee" (10/21/2015)  . CAD (coronary artery disease)    a. Cath 10/21/15: s/p DES to RCA: moderate diffuse stenosis of mod diagonal, diffuse irregularity LCx and LAD. 2D echo 10/21/15: mod LVH, EF 60-65%, no RWMA, calcified MV, mod-severe LAE. 03/18/17 PCI with DES--> 1 diag  . Cataract   . Essential hypertension   . Family history of adverse reaction to anesthesia    "oldest sister, [name], related to brain formation at back of head; when they put her to sleep it's hard to wake her up"  . GERD (gastroesophageal reflux disease)   . History of blood transfusion    "related to ruptured tubal pregnancy"  . Hyperlipidemia   . Hypertensive heart disease   . Morbid obesity ([place])   . Type II diabetes mellitus ([place])     Current Outpatient Prescriptions  Medication Sig Dispense Refill  . acetaminophen (TYLENOL) 325 MG tablet Take 2 tablets (650 mg total) by mouth every 4 (four) hours as needed for headache or mild pain.    [name] amLODipine (NORVASC) 5 MG tablet Take 1 tablet (5 mg total) by mouth daily. 90 tablet 3  . aspirin 81 MG tablet Take 81 mg by mouth daily.    [name] atorvastatin (LIPITOR) 80 MG tablet TAKE 1 TABLET BY MOUTH EVERY EVENING 90  tablet 1  . carvedilol (COREG) 25 MG tablet Take 1 tablet (25 mg total) by mouth 2 (two) times daily. 180 tablet 3  . clopidogrel (PLAVIX) 75 MG tablet TAKE 1 TABLET (75 MG TOTAL) BY MOUTH DAILY. 90 tablet 2  . DiphenhydrAMINE HCl, Sleep, (UNISOM SLEEPGELS PO) Take 1 tablet by mouth at bedtime as needed (SLEEP).    [name] doxazosin (CARDURA) 4 MG tablet TAKE 1 TABLET (4 MG TOTAL) BY MOUTH AT BEDTIME. 90 tablet 0  . ferrous sulfate 325 (65 FE) MG tablet TAKE 1 TABLET (325 MG TOTAL) BY MOUTH 3 (THREE) TIMES DAILY WITH MEALS. 90 tablet 3  . furosemide (LASIX) 20 MG tablet TAKE 1 TABLET (20 MG TOTAL) BY MOUTH DAILY. 90 tablet 1  . glipiZIDE (GLUCOTROL) 10 MG tablet TAKE 1 TABLET (10 MG TOTAL) BY MOUTH 2 (TWO) TIMES DAILY BEFORE A MEAL. 180 tablet 0  . isosorbide mononitrate (IMDUR) 30 MG 24 hr tablet Take 1 tablet (30 mg total) by mouth daily. 90 tablet 3  . liraglutide 18 MG/3ML SOPN Inject 0.2 mLs (1.2 mg total) into the skin daily 6 pen 1  . losartan (COZAAR) 100 MG tablet TAKE 1 TABLET (100 MG TOTAL) BY MOUTH DAILY. 90 tablet 0  . metFORMIN (GLUCOPHAGE) 1000 MG tablet Take 1 tablet (1,000 mg total)  by mouth 2 (two) times daily with a meal. 180 tablet 1  . Multiple Vitamins-Minerals (MULTIVITAMIN WITH MINERALS) tablet Take 1 tablet by mouth daily.    . nitroGLYCERIN (NITROSTAT) 0.4 MG SL tablet Place 1 tablet (0.4 mg total) under the tongue every 5 (five) minutes as needed for chest pain (up to 3 doses). 25 tablet 3  . spironolactone (ALDACTONE) 25 MG tablet TAKE 1 TABLET (25 MG TOTAL) BY MOUTH DAILY. 90 tablet 0   No current facility-administered medications for this visit.     Allergies  Allergen Reactions  . Iodinated Diagnostic Agents Hives  . Sulfa Antibiotics Hives    Family History  Problem Relation Age of Onset  . Diabetes Mother   . Heart attack Mother        2007  . Hypertension Mother   . Hyperlipidemia Mother   . Heart disease Mother   . Heart attack Father   . Stroke Father     . Hypertension Father   . Hyperlipidemia Father   . Stomach cancer Sister   . Diabetes Sister   . Diabetes Sister   . Diabetes Sister     Social History   Social History  . Marital status: Divorced    Spouse name: N/A  . Number of children: 3  . Years of education: doctorate   Occupational History  . Teacher    Social History Main Topics  . Smoking status: Never Smoker  . Smokeless tobacco: Never Used  . Alcohol use No  . Drug use: No  . Sexual activity: Not Currently   Other Topics Concern  . Not on file   Social History Narrative   Caffeine use-yes   Regular exercise-no     Constitutional: Denies fever, malaise, fatigue, headache or abrupt weight changes.  Gastrointestinal: Denies abdominal pain, bloating, constipation, diarrhea or blood in the stool.  GU: Denies urgency, frequency, pain with urination, burning sensation, blood in urine, odor or discharge. Skin: Denies redness, rashes, lesions or ulcercations.  Neurological: Denies dizziness, difficulty with memory, difficulty with speech or problems with balance and coordination.    No other specific complaints in a complete review of systems (except as listed in HPI above).  Objective:   Physical Exam  BP 118/70   Pulse 81   Temp 98.1 F (36.7 C) (Oral)   Wt (!) 305 lb (138.3 kg)   SpO2 97%   BMI 50.37 kg/m  Wt Readings from Last 3 Encounters:  06/06/17 (!) 305 lb (138.3 kg)  04/23/17 (!) 308 lb 10.3 oz (140 kg)  04/08/17 (!) 307 lb 1.9 oz (139.3 kg)    General: Appears her stated age, obese in NAD. Skin: Warm, dry and intact. No ulcerations noted. Neurological: Alert and oriented. Sensation intact to BLE.   BMET    Component Value Date/Time   NA 142 04/08/2017 1353   K 4.5 04/08/2017 1353   CL 105 04/08/2017 1353   CO2 23 04/08/2017 1353   GLUCOSE 152 (H) 04/08/2017 1353   GLUCOSE 180 (H) 03/19/2017 0310   BUN 19 04/08/2017 1353   CREATININE 1.46 (H) 04/08/2017 1353   CREATININE 1.55  (H) 08/10/2016 0850   CALCIUM 9.2 04/08/2017 1353   GFRNONAA 36 (L) 04/08/2017 1353   GFRAA 42 (L) 04/08/2017 1353    Lipid Panel     Component Value Date/Time   CHOL 168 02/26/2017 1329   TRIG 122.0 02/26/2017 1329   HDL 43.30 02/26/2017 1329   CHOLHDL 4 02/26/2017 1329  VLDL 24.4 02/26/2017 1329   LDLCALC 101 (H) 02/26/2017 1329    CBC    Component Value Date/Time   WBC 10.0 03/19/2017 0310   RBC 4.53 03/19/2017 0310   HGB 9.9 (L) 03/19/2017 0310   HGB 10.6 (L) 03/15/2017 1341   HCT 32.0 (L) 03/19/2017 0310   HCT 34.7 03/15/2017 1341   PLT 201 03/19/2017 0310   PLT 208 03/15/2017 1341   MCV 70.6 (L) 03/19/2017 0310   MCV 72 (L) 03/15/2017 1341   MCH 21.9 (L) 03/19/2017 0310   MCHC 30.9 03/19/2017 0310   RDW 15.5 03/19/2017 0310   RDW 16.2 (H) 03/15/2017 1341   LYMPHSABS 1.5 11/16/2016 1212   MONOABS 0.3 11/16/2016 1212   EOSABS 0.1 11/16/2016 1212   BASOSABS 0.0 11/16/2016 1212    Hgb A1C Lab Results  Component Value Date   HGBA1C 14.1 (H) 02/26/2017            Assessment & Plan:

## 2017-06-06 NOTE — Patient Instructions (Signed)

## 2017-06-07 ENCOUNTER — Encounter (HOSPITAL_COMMUNITY)
Admission: RE | Admit: 2017-06-07 | Discharge: 2017-06-07 | Disposition: A | Discharge status: Home / Self Care | Payer: Medicare Other | Source: Ambulatory Visit | Attending: Internal Medicine | Admitting: Internal Medicine

## 2017-06-07 DIAGNOSIS — Z955 Presence of coronary angioplasty implant and graft: Secondary | ICD-10-CM

## 2017-06-07 DIAGNOSIS — Z48812 Encounter for surgical aftercare following surgery on the circulatory system: Secondary | ICD-10-CM | POA: Diagnosis not present

## 2017-06-07 MED ORDER — SITAGLIPTIN PHOSPHATE 100 MG PO TABS: 100.0000 mg | ORAL_TABLET | Freq: Every day | ORAL | 3 refills | Status: DC

## 2017-06-07 NOTE — Addendum Note (Signed)
Addended by: Lurlean Nanny on: 06/07/2017 10:15 AM   Modules accepted: Orders

## 2017-06-08 ENCOUNTER — Other Ambulatory Visit: Payer: Self-pay | Admitting: Internal Medicine

## 2017-06-08 DIAGNOSIS — I2510 Atherosclerotic heart disease of native coronary artery without angina pectoris: Secondary | ICD-10-CM

## 2017-06-08 DIAGNOSIS — I119 Hypertensive heart disease without heart failure: Secondary | ICD-10-CM

## 2017-06-08 DIAGNOSIS — Z9861 Coronary angioplasty status: Principal | ICD-10-CM

## 2017-06-08 DIAGNOSIS — E785 Hyperlipidemia, unspecified: Secondary | ICD-10-CM

## 2017-06-08 DIAGNOSIS — R601 Generalized edema: Secondary | ICD-10-CM

## 2017-06-08 DIAGNOSIS — R7989 Other specified abnormal findings of blood chemistry: Secondary | ICD-10-CM

## 2017-06-09 ENCOUNTER — Other Ambulatory Visit: Payer: Self-pay | Admitting: Internal Medicine

## 2017-06-09 DIAGNOSIS — I10 Essential (primary) hypertension: Secondary | ICD-10-CM

## 2017-06-10 ENCOUNTER — Encounter (HOSPITAL_COMMUNITY)
Admission: RE | Admit: 2017-06-10 | Discharge: 2017-06-10 | Disposition: A | Discharge status: Home / Self Care | Payer: Medicare Other | Source: Ambulatory Visit | Attending: Internal Medicine | Admitting: Internal Medicine

## 2017-06-10 DIAGNOSIS — Z955 Presence of coronary angioplasty implant and graft: Secondary | ICD-10-CM

## 2017-06-10 DIAGNOSIS — Z48812 Encounter for surgical aftercare following surgery on the circulatory system: Secondary | ICD-10-CM | POA: Diagnosis not present

## 2017-06-10 LAB — GLUCOSE, CAPILLARY
GLUCOSE-CAPILLARY: 159 mg/dL — AB (ref 65–99)
GLUCOSE-CAPILLARY: 153 mg/dL — AB (ref 65–99)

## 2017-06-12 ENCOUNTER — Other Ambulatory Visit: Payer: Self-pay | Admitting: Internal Medicine

## 2017-06-12 ENCOUNTER — Encounter (HOSPITAL_COMMUNITY): Payer: Medicare Other

## 2017-06-14 ENCOUNTER — Encounter (HOSPITAL_COMMUNITY)
Admission: RE | Admit: 2017-06-14 | Discharge: 2017-06-14 | Disposition: A | Discharge status: Home / Self Care | Payer: Medicare Other | Source: Ambulatory Visit | Attending: Internal Medicine | Admitting: Internal Medicine

## 2017-06-14 DIAGNOSIS — Z955 Presence of coronary angioplasty implant and graft: Secondary | ICD-10-CM

## 2017-06-14 DIAGNOSIS — Z48812 Encounter for surgical aftercare following surgery on the circulatory system: Secondary | ICD-10-CM | POA: Diagnosis not present

## 2017-06-17 ENCOUNTER — Encounter (HOSPITAL_COMMUNITY)
Admission: RE | Admit: 2017-06-17 | Discharge: 2017-06-17 | Disposition: A | Discharge status: Home / Self Care | Payer: Medicare Other | Source: Ambulatory Visit | Attending: Internal Medicine | Admitting: Internal Medicine

## 2017-06-17 DIAGNOSIS — Z48812 Encounter for surgical aftercare following surgery on the circulatory system: Secondary | ICD-10-CM | POA: Diagnosis not present

## 2017-06-17 DIAGNOSIS — Z955 Presence of coronary angioplasty implant and graft: Secondary | ICD-10-CM

## 2017-06-17 LAB — GLUCOSE, CAPILLARY
GLUCOSE-CAPILLARY: 192 mg/dL — AB (ref 65–99)
Glucose-Capillary: 140 mg/dL — ABNORMAL HIGH (ref 65–99)

## 2017-06-19 ENCOUNTER — Encounter (HOSPITAL_COMMUNITY)
Admission: RE | Admit: 2017-06-19 | Discharge: 2017-06-19 | Disposition: A | Discharge status: Home / Self Care | Payer: Medicare Other | Source: Ambulatory Visit | Attending: Internal Medicine | Admitting: Internal Medicine

## 2017-06-19 DIAGNOSIS — Z955 Presence of coronary angioplasty implant and graft: Secondary | ICD-10-CM | POA: Diagnosis present

## 2017-06-19 DIAGNOSIS — Z48812 Encounter for surgical aftercare following surgery on the circulatory system: Secondary | ICD-10-CM | POA: Diagnosis present

## 2017-06-19 LAB — GLUCOSE, CAPILLARY
GLUCOSE-CAPILLARY: 110 mg/dL — AB (ref 65–99)
Glucose-Capillary: 141 mg/dL — ABNORMAL HIGH (ref 65–99)

## 2017-06-21 ENCOUNTER — Encounter (HOSPITAL_COMMUNITY)
Admission: RE | Admit: 2017-06-21 | Discharge: 2017-06-21 | Disposition: A | Discharge status: Home / Self Care | Payer: Medicare Other | Source: Ambulatory Visit | Attending: Internal Medicine | Admitting: Internal Medicine

## 2017-06-21 DIAGNOSIS — Z48812 Encounter for surgical aftercare following surgery on the circulatory system: Secondary | ICD-10-CM | POA: Diagnosis not present

## 2017-06-21 DIAGNOSIS — Z955 Presence of coronary angioplasty implant and graft: Secondary | ICD-10-CM

## 2017-06-21 LAB — GLUCOSE, CAPILLARY: Glucose-Capillary: 175 mg/dL — ABNORMAL HIGH (ref 65–99)

## 2017-06-24 ENCOUNTER — Encounter (HOSPITAL_COMMUNITY)
Admission: RE | Admit: 2017-06-24 | Discharge: 2017-06-24 | Disposition: A | Discharge status: Home / Self Care | Payer: Medicare Other | Source: Ambulatory Visit | Attending: Internal Medicine | Admitting: Internal Medicine

## 2017-06-24 DIAGNOSIS — Z955 Presence of coronary angioplasty implant and graft: Secondary | ICD-10-CM

## 2017-06-24 DIAGNOSIS — Z48812 Encounter for surgical aftercare following surgery on the circulatory system: Secondary | ICD-10-CM | POA: Diagnosis not present

## 2017-06-24 LAB — GLUCOSE, CAPILLARY: Glucose-Capillary: 150 mg/dL — ABNORMAL HIGH (ref 65–99)

## 2017-06-24 NOTE — Progress Notes (Signed)
Alicia [name] 71 y.o. female Nutrition Note Spoke with pt. Nutrition Plan and Nutrition Survey goals reviewed with pt. Pt is following Step 2 of the Therapeutic Lifestyle Changes diet. Pt wants to lose wt. Wt loss tips reviewed. Pt is diabetic. Pt does not check her CBG's at home due to "I have to check to see what my insurance covers and I always forget to call." Available inexpensive glucometers discussed.  Last A1c indicates blood glucose control has significantly improved from last A1c of 14.1 02/2017. Pt states "I was having chest pain and thought I was headed to glory so I just ate anything I wanted." This writer went over Diabetes Education test results. Pt reports the MD added Januvia yesterday. Pt expressed understanding of the information reviewed. Pt aware of nutrition education classes offered. Per pt, she has previously received Nutrition class handouts and has the handouts in a file at home.    Lab Results  Component Value Date   HGBA1C 8.1 (H) 06/06/2017   Wt Readings from Last 3 Encounters:  06/06/17 (!) 305 lb (138.3 kg)  04/23/17 (!) 308 lb 10.3 oz (140 kg)  04/08/17 (!) 307 lb 1.9 oz (139.3 kg)    Nutrition Diagnosis ? Food-and nutrition-related knowledge deficit related to lack of exposure to information as related to diagnosis of: ? CVD ? DM ? Obesity related to excessive energy intake as evidenced by a BMI of 51.1  Nutrition Intervention ? Pt's individual nutrition plan reviewed with pt. ? Benefits of adopting Therapeutic Lifestyle Changes discussed when Medficts reviewed.   ? Continue client-centered nutrition education by RD, as part of interdisciplinary care. Goal(s) ? Pt to identify food quantities necessary to achieve weight loss of 6-24 lb (2.7-10.9 kg) at graduation from cardiac rehab.  ? CBG concentrations in the normal range or as close to normal as is safely possible.  Derek Mound, M.Ed, RD, LDN, CDE 06/24/2017 2:37 PM

## 2017-06-26 ENCOUNTER — Telehealth (HOSPITAL_COMMUNITY): Payer: Self-pay | Admitting: Internal Medicine

## 2017-06-26 ENCOUNTER — Ambulatory Visit (INDEPENDENT_AMBULATORY_CARE_PROVIDER_SITE_OTHER): Payer: Medicare Other | Admitting: Primary Care

## 2017-06-26 ENCOUNTER — Encounter: Payer: Self-pay | Admitting: Primary Care

## 2017-06-26 ENCOUNTER — Ambulatory Visit (INDEPENDENT_AMBULATORY_CARE_PROVIDER_SITE_OTHER)
Admission: RE | Admit: 2017-06-26 | Discharge: 2017-06-26 | Disposition: A | Discharge status: Home / Self Care | Payer: Medicare Other | Source: Ambulatory Visit | Attending: Primary Care | Admitting: Primary Care

## 2017-06-26 ENCOUNTER — Encounter (HOSPITAL_COMMUNITY): Payer: Medicare Other

## 2017-06-26 VITALS — BP 124/80 | HR 82 | Temp 98.2°F | Wt 302.8 lb

## 2017-06-26 DIAGNOSIS — M79672 Pain in left foot: Secondary | ICD-10-CM

## 2017-06-26 NOTE — Progress Notes (Signed)
Subjective:    Patient ID: Alicia [name], female    DOB: 03-Nov-1946, 71 y.o.   MRN: [id number]  HPI  Alicia [name] is a 71 year old female with a history of obesity and type 2 diabetes who presents today with a chief complaint of foot pain. Her pain is located to the left dorsal foot that has been present since falling yesterday. She was walking into her bathroom yesterday and fell. She thinks she may have tripped over something. She fell onto her left side landing on her shoulder and hip, she twisted her left foot inward during the fall. Her shoulder and hip are much better today, but she can't put much weight onto her left foot. She's been taking Advil without improvement in pain or swelling to the foot.  Review of Systems  Musculoskeletal:       Left dorsal foot pain  Skin: Negative for color change.  Neurological: Negative for weakness.       Past Medical History:  Diagnosis Date  . Anemia   . Arthritis    "right knee" (10/21/2015)  . CAD (coronary artery disease)    a. Cath 10/21/15: s/p DES to RCA: moderate diffuse stenosis of mod diagonal, diffuse irregularity LCx and LAD. 2D echo 10/21/15: mod LVH, EF 60-65%, no RWMA, calcified MV, mod-severe LAE. 03/18/17 PCI with DES--> 1 diag  . Cataract   . Essential hypertension   . Family history of adverse reaction to anesthesia    "oldest sister, [name], related to brain formation at back of head; when they put her to sleep it's hard to wake her up"  . GERD (gastroesophageal reflux disease)   . History of blood transfusion    "related to ruptured tubal pregnancy"  . Hyperlipidemia   . Hypertensive heart disease   . Morbid obesity ([place])   . Type II diabetes mellitus ([place])      Social History   Social History  . Marital status: Divorced    Spouse name: N/A  . Number of children: 3  . Years of education: doctorate   Occupational History  . Teacher    Social History Main Topics  . Smoking status: Never Smoker  . Smokeless  tobacco: Never Used  . Alcohol use No  . Drug use: No  . Sexual activity: Not Currently   Other Topics Concern  . Not on file   Social History Narrative   Caffeine use-yes   Regular exercise-no    Past Surgical History:  Procedure Laterality Date  . ABDOMINAL HYSTERECTOMY  1988  . APPENDECTOMY  1985  . [place] SURGERY  1984  . CARDIAC CATHETERIZATION N/A 10/21/2015   Procedure: Right/Left Heart Cath and Coronary Angiography;  Surgeon: [name], MD;  Location: [facility];  Service: Cardiovascular;  Laterality: N/A;  . CHOLECYSTECTOMY OPEN  1984  . CORONARY STENT INTERVENTION N/A 03/18/2017   Procedure: Coronary Stent Intervention;  Surgeon: [name], MD;  Location: [facility];  Service: Cardiovascular;  Laterality: N/A;  . CORONARY STENT PLACEMENT  03/18/2017   A OPTIMIZE STUDY Drug Eluting Stent (2.5 mm 18 mm - post-dilated to 2.7 mm) was successfully placed  . ECTOPIC PREGNANCY SURGERY    . LEFT HEART CATH AND CORONARY ANGIOGRAPHY N/A 03/18/2017   Procedure: Left Heart Cath and Coronary Angiography;  Surgeon: [name], MD;  Location: [facility];  Service: Cardiovascular;  Laterality: N/A;  . REDUCTION MAMMAPLASTY Bilateral ~ 1976  .  TONSILLECTOMY      Family History  Problem Relation Age of Onset  . Diabetes Mother   . Heart attack Mother        2007  . Hypertension Mother   . Hyperlipidemia Mother   . Heart disease Mother   . Heart attack Father   . Stroke Father   . Hypertension Father   . Hyperlipidemia Father   . Stomach cancer Sister   . Diabetes Sister   . Diabetes Sister   . Diabetes Sister     Allergies  Allergen Reactions  . Iodinated Diagnostic Agents Hives  . Sulfa Antibiotics Hives    Current Outpatient Prescriptions on File Prior to Visit  Medication Sig Dispense Refill  . acetaminophen (TYLENOL) 325 MG tablet Take 2 tablets (650 mg total) by mouth every 4 (four) hours as needed for headache or mild  pain.    [name] amLODipine (NORVASC) 5 MG tablet Take 1 tablet (5 mg total) by mouth daily. 90 tablet 3  . aspirin 81 MG tablet Take 81 mg by mouth daily.    [name] atorvastatin (LIPITOR) 80 MG tablet TAKE 1 TABLET BY MOUTH EVERY EVENING 90 tablet 1  . carvedilol (COREG) 25 MG tablet Take 1 tablet (25 mg total) by mouth 2 (two) times daily. 180 tablet 3  . clopidogrel (PLAVIX) 75 MG tablet TAKE 1 TABLET (75 MG TOTAL) BY MOUTH DAILY. 90 tablet 2  . DiphenhydrAMINE HCl, Sleep, (UNISOM SLEEPGELS PO) Take 1 tablet by mouth at bedtime as needed (SLEEP).    [name] doxazosin (CARDURA) 4 MG tablet TAKE 1 TABLET (4 MG TOTAL) BY MOUTH AT BEDTIME. 90 tablet 0  . ferrous sulfate 325 (65 FE) MG tablet TAKE 1 TABLET (325 MG TOTAL) BY MOUTH 3 (THREE) TIMES DAILY WITH MEALS. 90 tablet 3  . furosemide (LASIX) 20 MG tablet TAKE 1 TABLET (20 MG TOTAL) BY MOUTH DAILY. 90 tablet 2  . glipiZIDE (GLUCOTROL) 10 MG tablet TAKE 1 TABLET (10 MG TOTAL) BY MOUTH 2 (TWO) TIMES DAILY BEFORE A MEAL. 180 tablet 0  . losartan (COZAAR) 100 MG tablet TAKE 1 TABLET (100 MG TOTAL) BY MOUTH DAILY. 90 tablet 0  . metFORMIN (GLUCOPHAGE) 1000 MG tablet Take 1 tablet (1,000 mg total) by mouth 2 (two) times daily with a meal. 180 tablet 1  . Multiple Vitamins-Minerals (MULTIVITAMIN WITH MINERALS) tablet Take 1 tablet by mouth daily.    . nitroGLYCERIN (NITROSTAT) 0.4 MG SL tablet Place 1 tablet (0.4 mg total) under the tongue every 5 (five) minutes as needed for chest pain (up to 3 doses). 25 tablet 3  . sitaGLIPtin (JANUVIA) 100 MG tablet Take 1 tablet (100 mg total) by mouth daily. 30 tablet 3  . spironolactone (ALDACTONE) 25 MG tablet TAKE 1 TABLET (25 MG TOTAL) BY MOUTH DAILY. 90 tablet 0  . VICTOZA 18 MG/3ML SOPN INJECT 0.2 MLS (1.2 MG TOTAL) INTO THE SKIN DAILY 6 pen 1  . isosorbide mononitrate (IMDUR) 30 MG 24 hr tablet Take 1 tablet (30 mg total) by mouth daily. 90 tablet 3   No current facility-administered medications on file prior to visit.       BP 124/80   Pulse 82   Temp 98.2 F (36.8 C) (Oral)   Wt (!) 302 lb 12.8 oz (137.3 kg)   SpO2 95%   BMI 50.00 kg/m    Objective:   Physical Exam  Constitutional: She appears well-nourished.  Neck: Neck supple.  Cardiovascular: Normal rate.   Pulmonary/Chest:  Effort normal and breath sounds normal.  Musculoskeletal:       Left ankle: She exhibits decreased range of motion and swelling. She exhibits no deformity and normal pulse.  Pain with plantar flexion and inversion of left foot.  Skin: Skin is warm and dry. No erythema.          Assessment & Plan:  Acute Foot Pain:  Since fall yesterday.  Exam today with decrease in ROM due to pain. Mild swelling, no obvious deformity.  Suspect MSK strain, but given trauma will check plain films. Xray of left foot pending. Discussed RICE. Discussed NSAID's and Ace bandage for support. Will await results.  Sheral Flow, NP

## 2017-06-26 NOTE — Patient Instructions (Signed)
Complete xray(s) prior to leaving today.   Elevate your foot when resting at home.  Apply ice to reduce swelling. Do this three times daily for 20 minute intervals.   You may take Advil 400-600 mg three times daily as needed for pain and inflammation.  Wrap your foot in an ACE bandage for support when walking.  I'll be in touch soon! It was a pleasure meeting you!

## 2017-06-28 ENCOUNTER — Encounter (HOSPITAL_COMMUNITY): Payer: Medicare Other

## 2017-07-01 ENCOUNTER — Encounter (HOSPITAL_COMMUNITY)
Admission: RE | Admit: 2017-07-01 | Discharge: 2017-07-01 | Disposition: A | Discharge status: Home / Self Care | Payer: Medicare Other | Source: Ambulatory Visit | Attending: Internal Medicine | Admitting: Internal Medicine

## 2017-07-01 DIAGNOSIS — Z955 Presence of coronary angioplasty implant and graft: Secondary | ICD-10-CM

## 2017-07-01 DIAGNOSIS — Z48812 Encounter for surgical aftercare following surgery on the circulatory system: Secondary | ICD-10-CM | POA: Diagnosis not present

## 2017-07-01 LAB — GLUCOSE, CAPILLARY: Glucose-Capillary: 171 mg/dL — ABNORMAL HIGH (ref 65–99)

## 2017-07-03 ENCOUNTER — Encounter (HOSPITAL_COMMUNITY)
Admission: RE | Admit: 2017-07-03 | Discharge: 2017-07-03 | Disposition: A | Discharge status: Home / Self Care | Payer: Medicare Other | Source: Ambulatory Visit | Attending: Internal Medicine | Admitting: Internal Medicine

## 2017-07-03 DIAGNOSIS — Z955 Presence of coronary angioplasty implant and graft: Secondary | ICD-10-CM

## 2017-07-03 DIAGNOSIS — Z48812 Encounter for surgical aftercare following surgery on the circulatory system: Secondary | ICD-10-CM | POA: Diagnosis not present

## 2017-07-04 NOTE — Progress Notes (Signed)
Cardiac Individual Treatment Plan  Patient Details  Name: Alicia [name] MRN: [id number] Date of Birth: 1946/05/30 Referring Provider:     CARDIAC REHAB PHASE II ORIENTATION from 04/23/2017 in [place]  Referring Provider  [name], MD.      Initial Encounter Date:    CARDIAC REHAB PHASE II ORIENTATION from 04/23/2017 in [place]  Date  04/23/17  Referring Provider  [name], MD.      Visit Diagnosis: 03/18/17 DES D1  Patient's Home Medications on Admission:  Current Outpatient Prescriptions:  .  acetaminophen (TYLENOL) 325 MG tablet, Take 2 tablets (650 mg total) by mouth every 4 (four) hours as needed for headache or mild pain., Disp: , Rfl:  .  amLODipine (NORVASC) 5 MG tablet, Take 1 tablet (5 mg total) by mouth daily., Disp: 90 tablet, Rfl: 3 .  aspirin 81 MG tablet, Take 81 mg by mouth daily., Disp: , Rfl:  .  atorvastatin (LIPITOR) 80 MG tablet, TAKE 1 TABLET BY MOUTH EVERY EVENING, Disp: 90 tablet, Rfl: 1 .  carvedilol (COREG) 25 MG tablet, Take 1 tablet (25 mg total) by mouth 2 (two) times daily., Disp: 180 tablet, Rfl: 3 .  clopidogrel (PLAVIX) 75 MG tablet, TAKE 1 TABLET (75 MG TOTAL) BY MOUTH DAILY., Disp: 90 tablet, Rfl: 2 .  DiphenhydrAMINE HCl, Sleep, (UNISOM SLEEPGELS PO), Take 1 tablet by mouth at bedtime as needed (SLEEP)., Disp: , Rfl:  .  doxazosin (CARDURA) 4 MG tablet, TAKE 1 TABLET (4 MG TOTAL) BY MOUTH AT BEDTIME., Disp: 90 tablet, Rfl: 0 .  ferrous sulfate 325 (65 FE) MG tablet, TAKE 1 TABLET (325 MG TOTAL) BY MOUTH 3 (THREE) TIMES DAILY WITH MEALS., Disp: 90 tablet, Rfl: 3 .  furosemide (LASIX) 20 MG tablet, TAKE 1 TABLET (20 MG TOTAL) BY MOUTH DAILY., Disp: 90 tablet, Rfl: 2 .  glipiZIDE (GLUCOTROL) 10 MG tablet, TAKE 1 TABLET (10 MG TOTAL) BY MOUTH 2 (TWO) TIMES DAILY BEFORE A MEAL., Disp: 180 tablet, Rfl: 0 .  isosorbide mononitrate (IMDUR) 30 MG 24 hr tablet, Take 1 tablet (30 mg total)  by mouth daily., Disp: 90 tablet, Rfl: 3 .  losartan (COZAAR) 100 MG tablet, TAKE 1 TABLET (100 MG TOTAL) BY MOUTH DAILY., Disp: 90 tablet, Rfl: 0 .  metFORMIN (GLUCOPHAGE) 1000 MG tablet, Take 1 tablet (1,000 mg total) by mouth 2 (two) times daily with a meal., Disp: 180 tablet, Rfl: 1 .  Multiple Vitamins-Minerals (MULTIVITAMIN WITH MINERALS) tablet, Take 1 tablet by mouth daily., Disp: , Rfl:  .  nitroGLYCERIN (NITROSTAT) 0.4 MG SL tablet, Place 1 tablet (0.4 mg total) under the tongue every 5 (five) minutes as needed for chest pain (up to 3 doses)., Disp: 25 tablet, Rfl: 3 .  sitaGLIPtin (JANUVIA) 100 MG tablet, Take 1 tablet (100 mg total) by mouth daily., Disp: 30 tablet, Rfl: 3 .  spironolactone (ALDACTONE) 25 MG tablet, TAKE 1 TABLET (25 MG TOTAL) BY MOUTH DAILY., Disp: 90 tablet, Rfl: 0 .  VICTOZA 18 MG/3ML SOPN, INJECT 0.2 MLS (1.2 MG TOTAL) INTO THE SKIN DAILY, Disp: 6 pen, Rfl: 1  Past Medical History: Past Medical History:  Diagnosis Date  . Anemia   . Arthritis    "right knee" (10/21/2015)  . CAD (coronary artery disease)    a. Cath 10/21/15: s/p DES to RCA: moderate diffuse stenosis of mod diagonal, diffuse irregularity LCx and LAD. 2D echo 10/21/15: mod LVH, EF 60-65%,  no RWMA, calcified MV, mod-severe LAE. 03/18/17 PCI with DES--> 1 diag  . Cataract   . Essential hypertension   . Family history of adverse reaction to anesthesia    "oldest sister, [name], related to brain formation at back of head; when they put her to sleep it's hard to wake her up"  . GERD (gastroesophageal reflux disease)   . History of blood transfusion    "related to ruptured tubal pregnancy"  . Hyperlipidemia   . Hypertensive heart disease   . Morbid obesity ([place])   . Type II diabetes mellitus ([facility])     Tobacco Use: History  Smoking Status  . Never Smoker  Smokeless Tobacco  . Never Used    Labs: Recent Review Flowsheet Data    Labs for ITP Cardiac and Pulmonary Rehab Latest Ref Rng & Units  08/10/2016 08/13/2016 11/15/2016 02/26/2017 06/06/2017   Cholestrol 0 - 200 mg/dL 135 - 150 168 -   LDLCALC 0 - 99 mg/dL 77 - 80 101(H) -   HDL >39.00 mg/dL 43(L) - 42.70 43.30 -   Trlycerides 0.0 - 149.0 mg/dL 77 - 134.0 122.0 -   Hemoglobin A1c 4.6 - 6.5 % - 14.1 Repeated and verified X2.(H) 15.3(H) 14.1(H) 8.1(H)   PHART 7.350 - 7.450 - - - - -   PCO2ART 35.0 - 45.0 mmHg - - - - -   HCO3 20.0 - 24.0 mEq/L - - - - -   TCO2 0 - 100 mmol/L - - - - -   O2SAT % - - - - -      Capillary Blood Glucose: Lab Results  Component Value Date   GLUCAP 171 (H) 07/01/2017   GLUCAP 150 (H) 06/24/2017   GLUCAP 175 (H) 06/21/2017   GLUCAP 141 (H) 06/19/2017   GLUCAP 110 (H) 06/19/2017     Exercise Target Goals:    Exercise Program Goal: Individual exercise prescription set with THRR, safety & activity barriers. Participant demonstrates ability to understand and report RPE using BORG scale, to self-measure pulse accurately, and to acknowledge the importance of the exercise prescription.  Exercise Prescription Goal: Starting with aerobic activity 30 plus minutes a day, 3 days per week for initial exercise prescription. Provide home exercise prescription and guidelines that participant acknowledges understanding prior to discharge.  Activity Barriers & Risk Stratification:     Activity Barriers & Cardiac Risk Stratification - 04/23/17 1115      Activity Barriers & Cardiac Risk Stratification   Activity Barriers Arthritis;Other (comment)   Comments Swelling in left leg and foot.   Cardiac Risk Stratification High      6 Minute Walk:     6 Minute Walk    Row Name 04/23/17 1114         6 Minute Walk   Phase Initial     Distance 878 feet     Walk Time 6 minutes     # of Rest Breaks 2     MPH 1.66     METS 1.45     RPE 15     VO2 Peak 5.07     Symptoms No     Resting HR 86 bpm     Resting BP 116/78     Max Ex. HR 125 bpm     Max Ex. BP 164/80     2 Minute Post BP 128/80         Oxygen Initial Assessment:   Oxygen Re-Evaluation:   Oxygen Discharge (Final Oxygen Re-Evaluation):  Initial Exercise Prescription:     Initial Exercise Prescription - 04/23/17 1100      Date of Initial Exercise RX and Referring Provider   Date 04/23/17   Referring Provider [name], MD.     Recumbant Bike   Level 1   Watts 15   Minutes 15   METs 2.15     NuStep   Level 2   SPM 85   Minutes 15   METs 2.2     Track   Laps 10   Minutes 15   METs 2.16     Prescription Details   Frequency (times per week) 3   Duration Progress to 30 minutes of continuous aerobic without signs/symptoms of physical distress     Intensity   THRR 40-80% of Max Heartrate 60-121   Ratings of Perceived Exertion 11-13   Perceived Dyspnea 0-4     Progression   Progression Continue to progress workloads to maintain intensity without signs/symptoms of physical distress.     Resistance Training   Training Prescription Yes   Weight 2lb wts.   Reps 10-15      Perform Capillary Blood Glucose checks as needed.  Exercise Prescription Changes:     Exercise Prescription Changes    Row Name 05/06/17 1600 06/04/17 1200 06/17/17 1616 07/03/17 1600       Response to Exercise   Blood Pressure (Admit) 136/80 128/82 104/62 120/62    Blood Pressure (Exercise) 140/80 134/76 144/78 138/60    Blood Pressure (Exit) 122/66 130/80 122/74 120/70    Heart Rate (Admit) 90 bpm 68 bpm 98 bpm 87 bpm    Heart Rate (Exercise) 109 bpm 122 bpm 107 bpm 113 bpm    Heart Rate (Exit) 90 bpm 67 bpm 98 bpm 83 bpm    Rating of Perceived Exertion (Exercise) 13 14 13 13     Symptoms none none none none    Comments Pt was oriented to exercise equipment 04/29/17 Pt was oriented to exercise equipment 04/29/17  -  -    Duration Continue with 30 min of aerobic exercise without signs/symptoms of physical distress. Continue with 30 min of aerobic exercise without signs/symptoms of physical distress. Continue with 30  min of aerobic exercise without signs/symptoms of physical distress. Continue with 30 min of aerobic exercise without signs/symptoms of physical distress.    Intensity THRR unchanged THRR unchanged THRR unchanged THRR unchanged      Progression   Progression Continue to progress workloads to maintain intensity without signs/symptoms of physical distress. Continue to progress workloads to maintain intensity without signs/symptoms of physical distress. Continue to progress workloads to maintain intensity without signs/symptoms of physical distress. Continue to progress workloads to maintain intensity without signs/symptoms of physical distress.    Average METs 1.8 1.8 1.7 1.9      Resistance Training   Training Prescription Yes Yes Yes Yes    Weight 1lb 2lbs 2lbs 2lbs    Reps 10-15 10-15 10-15 10-15    Time 10 Minutes 10 Minutes 10 Minutes 10 Minutes      Recumbant Bike   Level 2  - 2.5 2.5    Watts 15  - 12 12    Minutes 15  - 15 15    METs 1.9  - 1.6 1.6      NuStep   Level 2 4 4 4     SPM 80 80 80 85    Minutes 15 15 15 15     METs 1.7 1.9 1.9  2.1      Track   Laps  - 7  -  -    Minutes  - 15  -  -    METs  - 1.6  -  -      Home Exercise Plan   Plans to continue exercise at  - Home (comment) Home (comment)  recumbent bike Home (comment)  recumbent bike    Frequency  - Add 2 additional days to program exercise sessions. Add 2 additional days to program exercise sessions. Add 2 additional days to program exercise sessions.    Initial Home Exercises Provided  - 05/15/17 05/15/17 05/15/17       Exercise Comments:     Exercise Comments    Row Name 05/06/17 1634 06/04/17 1240 07/03/17 1615       Exercise Comments Pt was oriented to exercise eqipment on 04/29/17. Pt is tolerating exercise well; will continue to monitor activity levels and exercise progression. Reviewed METs and goals. Pt is tolerating exercise well; will continue to monitor exercise progression. Reviewed METs and  goals. Pt is tolerating exercise well; will continue to monitor exercise progression.        Exercise Goals and Review:     Exercise Goals    Row Name 04/23/17 1116             Exercise Goals   Increase Physical Activity Yes       Intervention Provide advice, education, support and counseling about physical activity/exercise needs.;Develop an individualized exercise prescription for aerobic and resistive training based on initial evaluation findings, risk stratification, comorbidities and participant's personal goals.       Expected Outcomes Achievement of increased cardiorespiratory fitness and enhanced flexibility, muscular endurance and strength shown through measurements of functional capacity and personal statement of participant.       Increase Strength and Stamina Yes       Intervention Provide advice, education, support and counseling about physical activity/exercise needs.;Develop an individualized exercise prescription for aerobic and resistive training based on initial evaluation findings, risk stratification, comorbidities and participant's personal goals.       Expected Outcomes Achievement of increased cardiorespiratory fitness and enhanced flexibility, muscular endurance and strength shown through measurements of functional capacity and personal statement of participant.          Exercise Goals Re-Evaluation :     Exercise Goals Re-Evaluation    Row Name 05/15/17 1632 06/04/17 1742 07/02/17 1017         Exercise Goal Re-Evaluation   Exercise Goals Review Increase Strenth and Stamina;Increase Physical Activity  - Increase Physical Activity;Increase Strenth and Stamina     Comments Reviewed home exercise with pt today.  Pt plans to use stationary bike and leg exercises to build on strength and stamina. Pt will start at 5 min. 2x/day on bike. Discussed exercise progression and safety.  Reviewed THR, pulse, RPE, sign and symptoms, NTG use, and when to call 911 or MD.   Also discussed weather considerations and indoor options.  Pt voiced understanding.  Pt plans to use stationary bike and leg exercises to build on strength and stamina. Pt will start at 5 min. 2x/day on bike.  Pt continues to improve in aerobic fitness in cardiac rehab. Pt has increase paced step climbing and is able to climb stairs for 2.45 without stopping.      Expected Outcomes Pt will build on aerobic capacity and overall functional mobility. Pt will build on aerobic capacity and overall functional mobility.  Pt will build on aerobic capacity (stair climbing) and overall functional mobility (negogiating stairs, 3 level home).         Discharge Exercise Prescription (Final Exercise Prescription Changes):     Exercise Prescription Changes - 07/03/17 1600      Response to Exercise   Blood Pressure (Admit) 120/62   Blood Pressure (Exercise) 138/60   Blood Pressure (Exit) 120/70   Heart Rate (Admit) 87 bpm   Heart Rate (Exercise) 113 bpm   Heart Rate (Exit) 83 bpm   Rating of Perceived Exertion (Exercise) 13   Symptoms none   Duration Continue with 30 min of aerobic exercise without signs/symptoms of physical distress.   Intensity THRR unchanged     Progression   Progression Continue to progress workloads to maintain intensity without signs/symptoms of physical distress.   Average METs 1.9     Resistance Training   Training Prescription Yes   Weight 2lbs   Reps 10-15   Time 10 Minutes     Recumbant Bike   Level 2.5   Watts 12   Minutes 15   METs 1.6     NuStep   Level 4   SPM 85   Minutes 15   METs 2.1     Home Exercise Plan   Plans to continue exercise at Home (comment)  recumbent bike   Frequency Add 2 additional days to program exercise sessions.   Initial Home Exercises Provided 05/15/17      Nutrition:  Target Goals: Understanding of nutrition guidelines, daily intake of sodium 1500mg , cholesterol 200mg , calories 30% from fat and 7% or less from saturated  fats, daily to have 5 or more servings of fruits and vegetables.  Biometrics:     Pre Biometrics - 04/23/17 1009      Pre Biometrics   Height 5' 5.25" (1.657 m)   Waist Circumference 49 inches   Hip Circumference 62 inches   Waist to Hip Ratio 0.79 %   BMI (Calculated) 51.1   Triceps Skinfold 67 mm   % Body Fat 61.2 %   Grip Strength 26 kg   Flexibility 10 in   Single Leg Stand 7.03 seconds       Nutrition Therapy Plan and Nutrition Goals:     Nutrition Therapy & Goals - 06/24/17 1448      Nutrition Therapy   Diet Carb Modified, Therapeutic Lifestyle Changes     Personal Nutrition Goals   Nutrition Goal Wt loss of 1-2 lb/week to a wt loss goal of 6-24 lb at graduation from [facility] Goal #2 Improved glycemic control as evidenced by A1c trending toward 7.0 or less.      Intervention Plan   Intervention Prescribe, educate and counsel regarding individualized specific dietary modifications aiming towards targeted core components such as weight, hypertension, lipid management, diabetes, heart failure and other comorbidities.   Expected Outcomes Short Term Goal: Understand basic principles of dietary content, such as calories, fat, sodium, cholesterol and nutrients.;Long Term Goal: Adherence to prescribed nutrition plan.      Nutrition Discharge: Nutrition Scores:     Nutrition Assessments - 06/24/17 1448      MEDFICTS Scores   Pre Score 38      Nutrition Goals Re-Evaluation:   Nutrition Goals Re-Evaluation:   Nutrition Goals Discharge (Final Nutrition Goals Re-Evaluation):   Psychosocial: Target Goals: Acknowledge presence or absence of significant depression and/or stress, maximize coping skills, provide positive support system. Participant is able to verbalize  types and ability to use techniques and skills needed for reducing stress and depression.  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  unfortunately her sister's failing health is making home caregiving more challenging.  pt also expresses concern about progression of her CAD     Family Dynamics   Good Support System? Yes  sister, [name] and her 3 children     Barriers   Psychosocial barriers to participate in program The patient should benefit from training in stress management and relaxation.     Screening Interventions   Interventions Encouraged to exercise;Provide feedback about the scores to participant;To provide support and resources with identified psychosocial needs      Quality of Life Scores:     Quality of Life - 05/06/17 1617      Quality of Life Scores   Health/Function Pre 18.11 %  pt with multiple health related concerns. pt with decreased strength/stamina experiences dyspnea with climbing stairs or walking in hot temperatures.  pt reports her stamina is improving she is now able to sweep and mop however has difficulty dusting.     Socioeconomic Pre 26.64 %   Psych/Spiritual Pre 28 %   Family Pre 22.5 %   GLOBAL Pre 22.69 %  pt given emotional support and reassurance.        PHQ-9: Recent Review Flowsheet Data    Depression screen [facility] 2/9 04/30/2017 10/16/2016 03/16/2016 11/23/2015 10/10/2015   Decreased Interest 0 0 0 0 0   Down, Depressed, Hopeless 1  0 0 0 0   PHQ - 2 Score 1 0 0 0 0     Interpretation of Total Score  Total Score Depression Severity:  1-4 = Minimal depression, 5-9 = Mild depression, 10-14 = Moderate depression, 15-19 = Moderately severe depression, 20-27 = Severe depression   Psychosocial Evaluation and Intervention:     Psychosocial Evaluation - 04/30/17 0705      Psychosocial Evaluation & Interventions   Interventions Stress management education;Relaxation education;Encouraged to  exercise with the program and follow exercise prescription   Comments pt will need stress management and taking care of self tools to reduce caregiver fatigue.    Expected Outcomes pt will exhibit positive outlook with good coping skills.    Continue Psychosocial Services  No Follow up required      Psychosocial Re-Evaluation:     Psychosocial Re-Evaluation    [place] Name 05/07/17 1651 06/03/17 1633 07/04/17 1054         Psychosocial Re-Evaluation   Current issues with Current Stress Concerns;History of Depression Current Stress Concerns;History of Depression Current Stress Concerns;History of Depression     Comments pt exhibits caregiver stress and fatigue. pt encouraged to participate in self care activities.   pt exhibits caregiver stress and fatigue. pt encouraged to participate in self care activities.   pt exhibits caregiver stress and fatigue. pt encouraged to participate in self care activities.   pt making positive interactions with her peers and classmates      Expected Outcomes pt will demonstrate positive outlook with good coping skills and self care managment.  pt will demonstrate positive outlook with good coping skills and self care managment.  pt will demonstrate positive outlook with good coping skills  and self care managment.      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  unfortunately her sister's failing health is making home caregiving more challenging.  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  unfortunately her sister's failing health is making home caregiving more challenging.  pt also expresses concern about progression of her CAD       Initial Review   Source of Stress Concerns Chronic Illness;Family Chronic Illness;Family Chronic Illness;Family        Psychosocial Discharge (Final Psychosocial Re-Evaluation):     Psychosocial Re-Evaluation - 07/04/17 1054      Psychosocial Re-Evaluation   Current issues with Current Stress Concerns;History of Depression   Comments pt exhibits caregiver stress and fatigue. pt encouraged to participate in self care activities.   pt making positive interactions with her peers and classmates    Expected Outcomes pt will demonstrate positive outlook with good coping skills and self care managment.    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  unfortunately her sister's failing health is making home caregiving more challenging.  pt also expresses concern about progression of her CAD     Initial Review   Source of Stress Concerns Chronic Illness;Family      Vocational Rehabilitation: Provide vocational rehab assistance to qualifying candidates.   Vocational Rehab Evaluation & Intervention:     Vocational Rehab - 04/23/17 1209      Initial Vocational Rehab Evaluation & Intervention   Assessment shows need for Vocational Rehabilitation No  retired       Education: Education Goals: Education classes will be provided on a weekly basis, covering required topics. Participant will state understanding/return demonstration of topics presented.  Learning Barriers/Preferences:     Learning Barriers/Preferences - 04/23/17 1154      Learning Barriers/Preferences    Learning Barriers Sight   Learning Preferences Written Material      Education Topics: Count Your Pulse:  -Group instruction provided by verbal instruction, demonstration, patient participation and written materials to support subject.  Instructors address importance of being able to find your pulse and how to count your pulse when at home without a heart monitor.  Patients get hands on experience counting their pulse with staff help and individually.   Heart Attack, Angina, and Risk Factor Modification:  -Group instruction provided by verbal instruction, video, and written materials to support subject.  Instructors address signs and symptoms of angina and heart attacks.    Also discuss risk factors for heart disease and how to make changes to improve heart health risk factors.   Functional Fitness:  -Group instruction provided by verbal instruction, demonstration, patient participation, and written materials to support subject.  Instructors address safety measures for doing things around the house.  Discuss how to get up and down off the floor, how to pick things up properly, how to safely get out of a  chair without assistance, and balance training.   Meditation and Mindfulness:  -Group instruction provided by verbal instruction, patient participation, and written materials to support subject.  Instructor addresses importance of mindfulness and meditation practice to help reduce stress and improve awareness.  Instructor also leads participants through a meditation exercise.    Stretching for Flexibility and Mobility:  -Group instruction provided by verbal instruction, patient participation, and written materials to support subject.  Instructors lead participants through series of stretches that are designed to increase flexibility thus improving mobility.  These stretches are additional exercise for major muscle groups that are typically performed during regular warm up and cool  down.   Hands Only CPR:  -Group verbal, video, and participation provides a basic overview of AHA guidelines for community CPR. Role-play of emergencies allow participants the opportunity to practice calling for help and chest compression technique with discussion of AED use.   Hypertension: -Group verbal and written instruction that provides a basic overview of hypertension including the most recent diagnostic guidelines, risk factor reduction with self-care instructions and medication management.    Nutrition I class: Heart Healthy Eating:  -Group instruction provided by PowerPoint slides, verbal discussion, and written materials to support subject matter. The instructor gives an explanation and review of the Therapeutic Lifestyle Changes diet recommendations, which includes a discussion on lipid goals, dietary fat, sodium, fiber, plant stanol/sterol esters, sugar, and the components of a well-balanced, healthy diet.   Nutrition II class: Lifestyle Skills:  -Group instruction provided by PowerPoint slides, verbal discussion, and written materials to support subject matter. The instructor gives an explanation and review of label reading, grocery shopping for heart health, heart healthy recipe modifications, and ways to make healthier choices when eating out.   Diabetes Question & Answer:  -Group instruction provided by PowerPoint slides, verbal discussion, and written materials to support subject matter. The instructor gives an explanation and review of diabetes co-morbidities, pre- and post-prandial blood glucose goals, pre-exercise blood glucose goals, signs, symptoms, and treatment of hypoglycemia and hyperglycemia, and foot care basics.   CARDIAC REHAB PHASE II EXERCISE from 06/19/2017 in [facility]  Date  05/31/17  Educator  RD  Instruction Review Code  2- meets goals/outcomes      Diabetes Blitz:  -Group instruction provided by PowerPoint slides,  verbal discussion, and written materials to support subject matter. The instructor gives an explanation and review of the physiology behind type 1 and type 2 diabetes, diabetes medications and rational behind using different medications, pre- and post-prandial blood glucose recommendations and Hemoglobin A1c goals, diabetes diet, and exercise including blood glucose guidelines for exercising safely.    Portion Distortion:  -Group instruction provided by PowerPoint slides, verbal discussion, written materials, and food models to support subject matter. The instructor gives an explanation of serving size versus portion size, changes in portions sizes over the last 20 years, and what consists of a serving from each food group.   CARDIAC REHAB PHASE II EXERCISE from 06/19/2017 in [facility]  Date  05/08/17  Educator  RD  Instruction Review Code  2- meets goals/outcomes      Stress Management:  -Group instruction provided by verbal instruction, video, and written materials to support subject matter.  Instructors review role of stress in heart disease and how to cope with stress positively.     CARDIAC REHAB PHASE II EXERCISE from 06/19/2017 in [facility]  Date  05/15/17  Instruction Review Code  2- meets goals/outcomes      Exercising on Your Own:  -Group instruction provided by verbal instruction, power point, and written materials to support subject.  Instructors discuss benefits of exercise, components of exercise, frequency and intensity of exercise, and end points for exercise.  Also discuss use of nitroglycerin and activating EMS.  Review options of places to exercise outside of rehab.  Review guidelines for sex with heart disease.   Cardiac Drugs I:  -Group instruction provided by verbal instruction and written materials to support subject.  Instructor reviews cardiac drug classes: antiplatelets, anticoagulants, beta blockers, and  statins.  Instructor discusses reasons, side effects, and lifestyle considerations for each drug class.   Cardiac Drugs II:  -Group instruction provided by verbal instruction and written materials to support subject.  Instructor reviews cardiac drug classes: angiotensin converting enzyme inhibitors (ACE-I), angiotensin II receptor blockers (ARBs), nitrates, and calcium channel blockers.  Instructor discusses reasons, side effects, and lifestyle considerations for each drug class.   CARDIAC REHAB PHASE II EXERCISE from 06/19/2017 in [facility]  Date  05/01/17  Educator  Pharm D  Instruction Review Code  2- meets goals/outcomes      Anatomy and Physiology of the Circulatory System:  Group verbal and written instruction and models provide basic cardiac anatomy and physiology, with the coronary electrical and arterial systems. Review of: AMI, Angina, Valve disease, Heart Failure, Peripheral Artery Disease, Cardiac Arrhythmia, Pacemakers, and the ICD.   CARDIAC REHAB PHASE II EXERCISE from 06/19/2017 in [facility]  Date  06/19/17  Instruction Review Code  2- meets goals/outcomes      Other Education:  -Group or individual verbal, written, or video instructions that support the educational goals of the cardiac rehab program.   Knowledge Questionnaire Score:     Knowledge Questionnaire Score - 04/23/17 1117      Knowledge Questionnaire Score   Pre Score 23/24      Core Components/Risk Factors/Patient Goals at Admission:     Personal Goals and Risk Factors at Admission - 04/23/17 1011      Core Components/Risk Factors/Patient Goals on Admission    Weight Management Yes;Obesity;Weight Maintenance;Weight Loss   Intervention Weight Management: Develop a combined nutrition and exercise program designed to reach desired caloric intake, while maintaining appropriate intake of nutrient and fiber, sodium and fats, and appropriate  energy expenditure required for the weight goal.;Weight Management: Provide education and appropriate resources to help participant work on and attain dietary goals.;Obesity: Provide education and appropriate resources to help participant work on and attain dietary goals.;Weight Management/Obesity: Establish reasonable short term and long term weight goals.   Admit Weight 308 lb 10.3 oz (140 kg)   Goal Weight: Short Term 305 lb 5.4 oz (138.5 kg)   Goal Weight: Long Term 300 lb (136.1 kg)   Expected Outcomes Short Term: Continue to assess and modify interventions until short term weight is achieved;Weight Maintenance: Understanding of the daily nutrition guidelines, which includes 25-35% calories from fat, 7% or less cal from saturated fats, less than 200mg  cholesterol, less than 1.5gm of sodium, & 5 or more servings of fruits and vegetables daily;Long Term: Adherence to nutrition and physical activity/exercise program aimed toward attainment of established weight goal;Weight Loss: Understanding of general recommendations for a balanced deficit meal plan, which promotes 1-2 lb weight loss per week and includes a negative energy balance of [phone] kcal/d;Understanding recommendations for meals to include 15-35% energy as  protein, 25-35% energy from fat, 35-60% energy from carbohydrates, less than 200mg  of dietary cholesterol, 20-35 gm of total fiber daily;Understanding of distribution of calorie intake throughout the day with the consumption of 4-5 meals/snacks   Diabetes Yes   Intervention Provide education about signs/symptoms and action to take for hypo/hyperglycemia.;Provide education about proper nutrition, including hydration, and aerobic/resistive exercise prescription along with prescribed medications to achieve blood glucose in normal ranges: Fasting glucose 65-99 mg/dL   Expected Outcomes Short Term: Participant verbalizes understanding of the signs/symptoms and immediate care of hyper/hypoglycemia,  proper foot care and importance of medication, aerobic/resistive exercise and nutrition plan for blood glucose control.   Hypertension Yes   Intervention Provide education on lifestyle modifcations including regular physical activity/exercise, weight management, moderate sodium restriction and increased consumption of fresh fruit, vegetables, and low fat dairy, alcohol moderation, and smoking cessation.;Monitor prescription use compliance.   Expected Outcomes Short Term: Continued assessment and intervention until BP is < 140/93mm HG in hypertensive participants. < 130/57mm HG in hypertensive participants with diabetes, heart failure or chronic kidney disease.;Long Term: Maintenance of blood pressure at goal levels.   Stress Yes   Intervention Offer individual and/or small group education and counseling on adjustment to heart disease, stress management and health-related lifestyle change. Teach and support self-help strategies.;Refer participants experiencing significant psychosocial distress to appropriate mental health specialists for further evaluation and treatment. When possible, include family members and significant others in education/counseling sessions.   Expected Outcomes Short Term: Participant demonstrates changes in health-related behavior, relaxation and other stress management skills, ability to obtain effective social support, and compliance with psychotropic medications if prescribed.;Long Term: Emotional wellbeing is indicated by absence of clinically significant psychosocial distress or social isolation.      Core Components/Risk Factors/Patient Goals Review:      Goals and Risk Factor Review    Row Name 05/07/17 1650 06/03/17 1632 07/04/17 1053         Core Components/Risk Factors/Patient Goals Review   Personal Goals Review Weight Management/Obesity;Diabetes;Hypertension;Stress Weight Management/Obesity;Diabetes;Hypertension;Stress Weight  Management/Obesity;Diabetes;Hypertension;Stress     Review pt with multiple risk factors for CAD displays eagerness to participate in CR group exercise program. pt with multiple risk factors for CAD displays eagerness to participate in CR group exercise program.  pt is pleased with her increased ability to perform household activities. pt is especially excited to spend this week with her 74yo granddaughter.  pt with multiple risk factors for CAD displays eagerness to participate in CR group exercise program.  pt is pleased with her increased ability to perform household activities. pt demonstrates commitment to participate in CR group exercise classes. .      Expected Outcomes pt will participate in CR exercise, nutrition, and lifestyle education classes to decrease overall risk factors.  pt will participate in CR exercise, nutrition, and lifestyle education classes to decrease overall risk factors.  pt will participate in CR exercise, nutrition, and lifestyle education classes to decrease overall risk factors.         Core Components/Risk Factors/Patient Goals at Discharge (Final Review):      Goals and Risk Factor Review - 07/04/17 1053      Core Components/Risk Factors/Patient Goals Review   Personal Goals Review Weight Management/Obesity;Diabetes;Hypertension;Stress   Review pt with multiple risk factors for CAD displays eagerness to participate in CR group exercise program.  pt is pleased with her increased ability to perform household activities. pt demonstrates commitment to participate in CR group exercise classes. [name]  Expected Outcomes pt will participate in CR exercise, nutrition, and lifestyle education classes to decrease overall risk factors.       ITP Comments:     ITP Comments    Row Name 04/23/17 0815 05/07/17 1649 06/03/17 1632 07/04/17 1053     ITP Comments Medical Director- Dr. Fransico Him, MD. Medical Director- Dr. Fransico Him, MD. Medical Director- Dr. Fransico Him, MD.  Medical Director- Dr. Fransico Him, MD.       Comments: Pt is making expected progress toward personal goals after completing  23 sessions. Recommend continued exercise and life style modification education including  stress management and relaxation techniques to decrease cardiac risk profile.

## 2017-07-05 ENCOUNTER — Encounter (HOSPITAL_COMMUNITY)
Admission: RE | Admit: 2017-07-05 | Discharge: 2017-07-05 | Disposition: A | Discharge status: Home / Self Care | Payer: Medicare Other | Source: Ambulatory Visit | Attending: Internal Medicine | Admitting: Internal Medicine

## 2017-07-05 DIAGNOSIS — Z955 Presence of coronary angioplasty implant and graft: Secondary | ICD-10-CM

## 2017-07-05 DIAGNOSIS — Z48812 Encounter for surgical aftercare following surgery on the circulatory system: Secondary | ICD-10-CM | POA: Diagnosis not present

## 2017-07-08 ENCOUNTER — Encounter (HOSPITAL_COMMUNITY)
Admission: RE | Admit: 2017-07-08 | Discharge: 2017-07-08 | Disposition: A | Discharge status: Home / Self Care | Payer: Medicare Other | Source: Ambulatory Visit | Attending: Internal Medicine | Admitting: Internal Medicine

## 2017-07-08 DIAGNOSIS — Z955 Presence of coronary angioplasty implant and graft: Secondary | ICD-10-CM

## 2017-07-08 DIAGNOSIS — Z48812 Encounter for surgical aftercare following surgery on the circulatory system: Secondary | ICD-10-CM | POA: Diagnosis not present

## 2017-07-10 ENCOUNTER — Encounter (HOSPITAL_COMMUNITY)
Admission: RE | Admit: 2017-07-10 | Discharge: 2017-07-10 | Disposition: A | Discharge status: Home / Self Care | Payer: Medicare Other | Source: Ambulatory Visit | Attending: Internal Medicine | Admitting: Internal Medicine

## 2017-07-10 DIAGNOSIS — Z48812 Encounter for surgical aftercare following surgery on the circulatory system: Secondary | ICD-10-CM | POA: Diagnosis not present

## 2017-07-10 DIAGNOSIS — Z955 Presence of coronary angioplasty implant and graft: Secondary | ICD-10-CM

## 2017-07-10 LAB — GLUCOSE, CAPILLARY: GLUCOSE-CAPILLARY: 192 mg/dL — AB (ref 65–99)

## 2017-07-10 NOTE — Progress Notes (Deleted)
Cardiology Office Note   Date:  07/10/2017   ID:  Alicia [name], DOB 1946/06/17, MRN [id number]  PCP:  Jearld Fenton, NP  Cardiologist:   [name], MD   F/U of CAD     History of Present Illness: Alicia [name] is a 71 y.o. female with a history of CAD  In December  2016 L heart cath with resultant interventiosn to RCA (DES with residural I saw the pt in Fall 2017  He hs been seen by S Waver several times since  She had progressive angina and underaet L heart cath  This  Showed a high graded D1 leasion  Rx DES.  She was sen in f/u in May 2018  Outpatient Medications Prior to Visit  Medication Sig Dispense Refill  . acetaminophen (TYLENOL) 325 MG tablet Take 2 tablets (650 mg total) by mouth every 4 (four) hours as needed for headache or mild pain.    [name] amLODipine (NORVASC) 5 MG tablet Take 1 tablet (5 mg total) by mouth daily. 90 tablet 3  . aspirin 81 MG tablet Take 81 mg by mouth daily.    [name] atorvastatin (LIPITOR) 80 MG tablet TAKE 1 TABLET BY MOUTH EVERY EVENING 90 tablet 1  . carvedilol (COREG) 25 MG tablet Take 1 tablet (25 mg total) by mouth 2 (two) times daily. 180 tablet 3  . clopidogrel (PLAVIX) 75 MG tablet TAKE 1 TABLET (75 MG TOTAL) BY MOUTH DAILY. 90 tablet 2  . DiphenhydrAMINE HCl, Sleep, (UNISOM SLEEPGELS PO) Take 1 tablet by mouth at bedtime as needed (SLEEP).    [name] doxazosin (CARDURA) 4 MG tablet TAKE 1 TABLET (4 MG TOTAL) BY MOUTH AT BEDTIME. 90 tablet 0  . ferrous sulfate 325 (65 FE) MG tablet TAKE 1 TABLET (325 MG TOTAL) BY MOUTH 3 (THREE) TIMES DAILY WITH MEALS. 90 tablet 3  . furosemide (LASIX) 20 MG tablet TAKE 1 TABLET (20 MG TOTAL) BY MOUTH DAILY. 90 tablet 2  . glipiZIDE (GLUCOTROL) 10 MG tablet TAKE 1 TABLET (10 MG TOTAL) BY MOUTH 2 (TWO) TIMES DAILY BEFORE A MEAL. 180 tablet 0  . isosorbide mononitrate (IMDUR) 30 MG 24 hr tablet Take 1 tablet (30 mg total) by mouth daily. 90 tablet 3  . losartan (COZAAR) 100 MG tablet TAKE 1 TABLET (100 MG TOTAL) BY  MOUTH DAILY. 90 tablet 0  . metFORMIN (GLUCOPHAGE) 1000 MG tablet Take 1 tablet (1,000 mg total) by mouth 2 (two) times daily with a meal. 180 tablet 1  . Multiple Vitamins-Minerals (MULTIVITAMIN WITH MINERALS) tablet Take 1 tablet by mouth daily.    . nitroGLYCERIN (NITROSTAT) 0.4 MG SL tablet Place 1 tablet (0.4 mg total) under the tongue every 5 (five) minutes as needed for chest pain (up to 3 doses). 25 tablet 3  . sitaGLIPtin (JANUVIA) 100 MG tablet Take 1 tablet (100 mg total) by mouth daily. 30 tablet 3  . spironolactone (ALDACTONE) 25 MG tablet TAKE 1 TABLET (25 MG TOTAL) BY MOUTH DAILY. 90 tablet 0  . VICTOZA 18 MG/3ML SOPN INJECT 0.2 MLS (1.2 MG TOTAL) INTO THE SKIN DAILY 6 pen 1   No facility-administered medications prior to visit.      Allergies:   Iodinated diagnostic agents and Sulfa antibiotics   Past Medical History:  Diagnosis Date  . Anemia   . Arthritis    "right knee" (10/21/2015)  . CAD (coronary artery disease)    a. Cath 10/21/15: s/p DES to RCA: moderate  diffuse stenosis of mod diagonal, diffuse irregularity LCx and LAD. 2D echo 10/21/15: mod LVH, EF 60-65%, no RWMA, calcified MV, mod-severe LAE. 03/18/17 PCI with DES--> 1 diag  . Cataract   . Essential hypertension   . Family history of adverse reaction to anesthesia    "oldest sister, Alicia [name], related to brain formation at back of head; when they put her to sleep it's hard to wake her up"  . GERD (gastroesophageal reflux disease)   . History of blood transfusion    "related to ruptured tubal pregnancy"  . Hyperlipidemia   . Hypertensive heart disease   . Morbid obesity ([place])   . Type II diabetes mellitus ([place])     Past Surgical History:  Procedure Laterality Date  . ABDOMINAL HYSTERECTOMY  1988  . APPENDECTOMY  1985  . [place] SURGERY  1984  . CARDIAC CATHETERIZATION N/A 10/21/2015   Procedure: Right/Left Heart Cath and Coronary Angiography;  Surgeon: [name], MD;  Location: [facility];   Service: Cardiovascular;  Laterality: N/A;  . CHOLECYSTECTOMY OPEN  1984  . CORONARY STENT INTERVENTION N/A 03/18/2017   Procedure: Coronary Stent Intervention;  Surgeon: [name], MD;  Location: [facility];  Service: Cardiovascular;  Laterality: N/A;  . CORONARY STENT PLACEMENT  03/18/2017   A OPTIMIZE STUDY Drug Eluting Stent (2.5 mm 18 mm - post-dilated to 2.7 mm) was successfully placed  . ECTOPIC PREGNANCY SURGERY    . LEFT HEART CATH AND CORONARY ANGIOGRAPHY N/A 03/18/2017   Procedure: Left Heart Cath and Coronary Angiography;  Surgeon: [name], MD;  Location: [facility];  Service: Cardiovascular;  Laterality: N/A;  . REDUCTION MAMMAPLASTY Bilateral ~ 1976  . TONSILLECTOMY       Social History:  The patient  reports that she has never smoked. She has never used smokeless tobacco. She reports that she does not drink alcohol or use drugs.   Family History:  The patient's family history includes Diabetes in her mother, sister, sister, and sister; Heart attack in her father and mother; Heart disease in her mother; Hyperlipidemia in her father and mother; Hypertension in her father and mother; Stomach cancer in her sister; Stroke in her father.    ROS:  Please see the history of present illness. All other systems are reviewed and  Negative to the above problem except as noted.    PHYSICAL EXAM: VS:  There were no vitals taken for this visit.  GEN: Morbidly obese 55 yod, in no acute distress  HEENT: normal  Neck: no JVD, carotid bruits, or masses Cardiac: RRR; no murmurs, rubs, or gallops,no edema  Respiratory:  clear to auscultation bilaterally, normal work of breathing GI: soft, nontender, nondistended, + BS  No hepatomegaly  MS: no deformity Moving all extremities   Skin: warm and dry, no rash Neuro:  Strength and sensation are intact Psych: euthymic mood, full affect   EKG:  EKG iis not  ordered today.   Lipid Panel    Component Value  Date/Time   CHOL 168 02/26/2017 1329   TRIG 122.0 02/26/2017 1329   HDL 43.30 02/26/2017 1329   CHOLHDL 4 02/26/2017 1329   VLDL 24.4 02/26/2017 1329   LDLCALC 101 (H) 02/26/2017 1329      Wt Readings from Last 3 Encounters:  06/26/17 (!) 302 lb 12.8 oz (137.3 kg)  06/06/17 (!) 305 lb (138.3 kg)  04/23/17 (!) 308 lb 10.3 oz (140 kg)      ASSESSMENT  AND PLAN:  1  CAD  NO symtposm of angina  Will check labs  Plan to continue Plavix for 1 year total (December)  2  GI  Will check CBC  Change in stool color  ? Blood vs FE  3  HTN  Good control  4  HL  Check lipids  5  Wt  Pt discouraged  I encouraged her to stay acitve    Will check TSH    F/U in 6 months     Current medicines are reviewed at length with the patient today.  The patient does not have concerns regarding medicines.  Signed, [name], MD  07/10/2017 10:50 PM    [facility] [address], [place], [place]  [address] Phone: [phone]; Fax: [phone]

## 2017-07-11 ENCOUNTER — Ambulatory Visit: Payer: Medicare Other | Admitting: Internal Medicine

## 2017-07-12 ENCOUNTER — Encounter (HOSPITAL_COMMUNITY)
Admission: RE | Admit: 2017-07-12 | Discharge: 2017-07-12 | Disposition: A | Discharge status: Home / Self Care | Payer: Medicare Other | Source: Ambulatory Visit | Attending: Internal Medicine | Admitting: Internal Medicine

## 2017-07-12 DIAGNOSIS — Z955 Presence of coronary angioplasty implant and graft: Secondary | ICD-10-CM

## 2017-07-12 DIAGNOSIS — Z48812 Encounter for surgical aftercare following surgery on the circulatory system: Secondary | ICD-10-CM | POA: Diagnosis not present

## 2017-07-13 ENCOUNTER — Observation Stay (HOSPITAL_COMMUNITY): Payer: Medicare Other

## 2017-07-13 ENCOUNTER — Emergency Department (HOSPITAL_COMMUNITY): Payer: Medicare Other

## 2017-07-13 ENCOUNTER — Observation Stay (HOSPITAL_COMMUNITY)
Admission: EM | Admit: 2017-07-13 | Discharge: 2017-07-15 | Disposition: A | Discharge status: Home / Self Care | Payer: Medicare Other | Attending: Internal Medicine | Admitting: Internal Medicine

## 2017-07-13 ENCOUNTER — Encounter (HOSPITAL_COMMUNITY): Payer: Self-pay | Admitting: Physician Assistant

## 2017-07-13 DIAGNOSIS — I1310 Hypertensive heart and chronic kidney disease without heart failure, with stage 1 through stage 4 chronic kidney disease, or unspecified chronic kidney disease: Secondary | ICD-10-CM | POA: Insufficient documentation

## 2017-07-13 DIAGNOSIS — Z6841 Body Mass Index (BMI) 40.0 and over, adult: Secondary | ICD-10-CM | POA: Insufficient documentation

## 2017-07-13 DIAGNOSIS — R7989 Other specified abnormal findings of blood chemistry: Secondary | ICD-10-CM

## 2017-07-13 DIAGNOSIS — N179 Acute kidney failure, unspecified: Secondary | ICD-10-CM | POA: Diagnosis not present

## 2017-07-13 DIAGNOSIS — IMO0002 Reserved for concepts with insufficient information to code with codable children: Secondary | ICD-10-CM | POA: Diagnosis present

## 2017-07-13 DIAGNOSIS — I10 Essential (primary) hypertension: Secondary | ICD-10-CM | POA: Diagnosis present

## 2017-07-13 DIAGNOSIS — E785 Hyperlipidemia, unspecified: Secondary | ICD-10-CM | POA: Diagnosis not present

## 2017-07-13 DIAGNOSIS — Z9861 Coronary angioplasty status: Secondary | ICD-10-CM

## 2017-07-13 DIAGNOSIS — R079 Chest pain, unspecified: Secondary | ICD-10-CM

## 2017-07-13 DIAGNOSIS — Z8249 Family history of ischemic heart disease and other diseases of the circulatory system: Secondary | ICD-10-CM | POA: Insufficient documentation

## 2017-07-13 DIAGNOSIS — Z79899 Other long term (current) drug therapy: Secondary | ICD-10-CM | POA: Diagnosis not present

## 2017-07-13 DIAGNOSIS — Z7984 Long term (current) use of oral hypoglycemic drugs: Secondary | ICD-10-CM | POA: Diagnosis not present

## 2017-07-13 DIAGNOSIS — R55 Syncope and collapse: Secondary | ICD-10-CM | POA: Diagnosis not present

## 2017-07-13 DIAGNOSIS — Z9884 Bariatric surgery status: Secondary | ICD-10-CM | POA: Diagnosis not present

## 2017-07-13 DIAGNOSIS — R601 Generalized edema: Secondary | ICD-10-CM

## 2017-07-13 DIAGNOSIS — D638 Anemia in other chronic diseases classified elsewhere: Secondary | ICD-10-CM | POA: Diagnosis not present

## 2017-07-13 DIAGNOSIS — N183 Chronic kidney disease, stage 3 unspecified: Secondary | ICD-10-CM | POA: Diagnosis present

## 2017-07-13 DIAGNOSIS — I119 Hypertensive heart disease without heart failure: Secondary | ICD-10-CM

## 2017-07-13 DIAGNOSIS — E1165 Type 2 diabetes mellitus with hyperglycemia: Secondary | ICD-10-CM | POA: Diagnosis present

## 2017-07-13 DIAGNOSIS — Z7902 Long term (current) use of antithrombotics/antiplatelets: Secondary | ICD-10-CM | POA: Insufficient documentation

## 2017-07-13 DIAGNOSIS — I2510 Atherosclerotic heart disease of native coronary artery without angina pectoris: Secondary | ICD-10-CM

## 2017-07-13 DIAGNOSIS — E66813 Obesity, class 3: Secondary | ICD-10-CM | POA: Diagnosis present

## 2017-07-13 DIAGNOSIS — K219 Gastro-esophageal reflux disease without esophagitis: Secondary | ICD-10-CM | POA: Diagnosis present

## 2017-07-13 DIAGNOSIS — Z955 Presence of coronary angioplasty implant and graft: Secondary | ICD-10-CM | POA: Diagnosis not present

## 2017-07-13 DIAGNOSIS — E1122 Type 2 diabetes mellitus with diabetic chronic kidney disease: Secondary | ICD-10-CM | POA: Insufficient documentation

## 2017-07-13 DIAGNOSIS — Z7982 Long term (current) use of aspirin: Secondary | ICD-10-CM | POA: Insufficient documentation

## 2017-07-13 DIAGNOSIS — D509 Iron deficiency anemia, unspecified: Secondary | ICD-10-CM | POA: Diagnosis present

## 2017-07-13 DIAGNOSIS — I200 Unstable angina: Secondary | ICD-10-CM | POA: Diagnosis present

## 2017-07-13 LAB — RETICULOCYTES
RBC.: 4.52 MIL/uL (ref 3.87–5.11)
RETIC COUNT ABSOLUTE: 72.3 10*3/uL (ref 19.0–186.0)
RETIC CT PCT: 1.6 % (ref 0.4–3.1)

## 2017-07-13 LAB — URINALYSIS, ROUTINE W REFLEX MICROSCOPIC
BILIRUBIN URINE: NEGATIVE
GLUCOSE, UA: NEGATIVE mg/dL
Hgb urine dipstick: NEGATIVE
KETONES UR: NEGATIVE mg/dL
LEUKOCYTES UA: NEGATIVE
NITRITE: NEGATIVE
PROTEIN: NEGATIVE mg/dL
Specific Gravity, Urine: 1.009 (ref 1.005–1.030)
pH: 5 (ref 5.0–8.0)

## 2017-07-13 LAB — IRON AND TIBC
Iron: 42 ug/dL (ref 28–170)
SATURATION RATIOS: 14 % (ref 10.4–31.8)
TIBC: 311 ug/dL (ref 250–450)
UIBC: 269 ug/dL

## 2017-07-13 LAB — VITAMIN B12: VITAMIN B 12: 621 pg/mL (ref 180–914)

## 2017-07-13 LAB — COMPREHENSIVE METABOLIC PANEL
ALT: 15 U/L (ref 14–54)
AST: 21 U/L (ref 15–41)
Albumin: 3.6 g/dL (ref 3.5–5.0)
Alkaline Phosphatase: 65 U/L (ref 38–126)
Anion gap: 11 (ref 5–15)
BILIRUBIN TOTAL: 0.7 mg/dL (ref 0.3–1.2)
BUN: 27 mg/dL — AB (ref 6–20)
CO2: 24 mmol/L (ref 22–32)
CREATININE: 1.9 mg/dL — AB (ref 0.44–1.00)
Calcium: 8.8 mg/dL — ABNORMAL LOW (ref 8.9–10.3)
Chloride: 104 mmol/L (ref 101–111)
GFR calc Af Amer: 30 mL/min — ABNORMAL LOW (ref >60)
GFR, EST NON AFRICAN AMERICAN: 26 mL/min — AB (ref >60)
Glucose, Bld: 95 mg/dL (ref 65–99)
Potassium: 4.3 mmol/L (ref 3.5–5.1)
Sodium: 139 mmol/L (ref 135–145)
TOTAL PROTEIN: 6.1 g/dL — AB (ref 6.5–8.1)

## 2017-07-13 LAB — GLUCOSE, CAPILLARY
GLUCOSE-CAPILLARY: 143 mg/dL — AB (ref 65–99)
Glucose-Capillary: 126 mg/dL — ABNORMAL HIGH (ref 65–99)

## 2017-07-13 LAB — FOLATE: Folate: 45.6 ng/mL (ref >5.9)

## 2017-07-13 LAB — CBC
HCT: 30.6 % — ABNORMAL LOW (ref 36.0–46.0)
Hemoglobin: 9.5 g/dL — ABNORMAL LOW (ref 12.0–15.0)
MCH: 22.6 pg — ABNORMAL LOW (ref 26.0–34.0)
MCHC: 31 g/dL (ref 30.0–36.0)
MCV: 72.9 fL — AB (ref 78.0–100.0)
PLATELETS: 216 10*3/uL (ref 150–400)
RBC: 4.2 MIL/uL (ref 3.87–5.11)
RDW: 15.4 % (ref 11.5–15.5)
WBC: 6.3 10*3/uL (ref 4.0–10.5)

## 2017-07-13 LAB — TROPONIN I: Troponin I: <0.03 ng/mL (ref <0.03)

## 2017-07-13 LAB — I-STAT TROPONIN, ED: Troponin i, poc: 0.08 ng/mL (ref 0.00–0.08)

## 2017-07-13 LAB — FERRITIN: Ferritin: 162 ng/mL (ref 11–307)

## 2017-07-13 MED ORDER — LORAZEPAM 2 MG/ML IJ SOLN: 1.0000 mg | Freq: Once | INTRAMUSCULAR | Status: DC

## 2017-07-13 MED ORDER — DOXAZOSIN MESYLATE 4 MG PO TABS
4.0000 mg | ORAL_TABLET | Freq: Every day | ORAL | Status: DC
  Administered 2017-07-14: 4 mg via ORAL
  Filled 2017-07-13: qty 1

## 2017-07-13 MED ORDER — CLOPIDOGREL BISULFATE 75 MG PO TABS
75.0000 mg | ORAL_TABLET | Freq: Every day | ORAL | Status: DC
  Administered 2017-07-14 – 2017-07-15 (×2): 75 mg via ORAL
  Filled 2017-07-13 (×2): qty 1

## 2017-07-13 MED ORDER — HEPARIN SODIUM (PORCINE) 5000 UNIT/ML IJ SOLN
5000.0000 [IU] | Freq: Three times a day (TID) | INTRAMUSCULAR | Status: DC
  Administered 2017-07-13 – 2017-07-15 (×6): 5000 [IU] via SUBCUTANEOUS
  Filled 2017-07-13 (×6): qty 1

## 2017-07-13 MED ORDER — ISOSORBIDE MONONITRATE ER 30 MG PO TB24
30.0000 mg | ORAL_TABLET | Freq: Every day | ORAL | Status: DC
  Administered 2017-07-14 – 2017-07-15 (×2): 30 mg via ORAL
  Filled 2017-07-13 (×2): qty 1

## 2017-07-13 MED ORDER — DOXAZOSIN MESYLATE 4 MG PO TABS: 4.0000 mg | ORAL_TABLET | Freq: Every day | ORAL | Status: DC

## 2017-07-13 MED ORDER — ASPIRIN EC 81 MG PO TBEC
81.0000 mg | DELAYED_RELEASE_TABLET | Freq: Every day | ORAL | Status: DC
  Administered 2017-07-14 – 2017-07-15 (×2): 81 mg via ORAL
  Filled 2017-07-13 (×2): qty 1

## 2017-07-13 MED ORDER — FUROSEMIDE 20 MG PO TABS
20.0000 mg | ORAL_TABLET | Freq: Every day | ORAL | Status: DC
  Administered 2017-07-14: 20 mg via ORAL
  Filled 2017-07-13: qty 1

## 2017-07-13 MED ORDER — HYDRALAZINE HCL 20 MG/ML IJ SOLN: 5.0000 mg | Freq: Three times a day (TID) | INTRAMUSCULAR | Status: DC | PRN

## 2017-07-13 MED ORDER — ACETAMINOPHEN 325 MG PO TABS: 650.0000 mg | ORAL_TABLET | Freq: Four times a day (QID) | ORAL | Status: DC | PRN

## 2017-07-13 MED ORDER — CARVEDILOL 12.5 MG PO TABS
25.0000 mg | ORAL_TABLET | Freq: Two times a day (BID) | ORAL | Status: DC
Start: 2017-07-14 — End: 2017-07-15
  Administered 2017-07-14 – 2017-07-15 (×2): 25 mg via ORAL
  Filled 2017-07-13 (×2): qty 2

## 2017-07-13 MED ORDER — SPIRONOLACTONE 25 MG PO TABS
25.0000 mg | ORAL_TABLET | Freq: Every day | ORAL | Status: DC
  Administered 2017-07-14: 25 mg via ORAL
  Filled 2017-07-13: qty 1

## 2017-07-13 MED ORDER — SODIUM CHLORIDE 0.9% FLUSH
3.0000 mL | Freq: Two times a day (BID) | INTRAVENOUS | Status: DC
  Administered 2017-07-13 – 2017-07-15 (×4): 3 mL via INTRAVENOUS

## 2017-07-13 MED ORDER — SODIUM CHLORIDE 0.9 % IV SOLN
INTRAVENOUS | Status: DC
  Administered 2017-07-13 – 2017-07-14 (×2): via INTRAVENOUS

## 2017-07-13 MED ORDER — AMLODIPINE BESYLATE 5 MG PO TABS
5.0000 mg | ORAL_TABLET | Freq: Every day | ORAL | Status: DC
  Administered 2017-07-14 – 2017-07-15 (×2): 5 mg via ORAL
  Filled 2017-07-13 (×2): qty 1

## 2017-07-13 MED ORDER — INSULIN ASPART 100 UNIT/ML ~~LOC~~ SOLN
0.0000 [IU] | Freq: Three times a day (TID) | SUBCUTANEOUS | Status: DC
  Administered 2017-07-13 – 2017-07-14 (×3): 1 [IU] via SUBCUTANEOUS
  Administered 2017-07-15: 3 [IU] via SUBCUTANEOUS

## 2017-07-13 MED ORDER — LOSARTAN POTASSIUM 50 MG PO TABS
100.0000 mg | ORAL_TABLET | Freq: Every day | ORAL | Status: DC
  Administered 2017-07-14 – 2017-07-15 (×2): 100 mg via ORAL
  Filled 2017-07-13 (×2): qty 2

## 2017-07-13 MED ORDER — ASPIRIN EC 325 MG PO TBEC: 325.0000 mg | DELAYED_RELEASE_TABLET | Freq: Once | ORAL | Status: DC

## 2017-07-13 MED ORDER — ACETAMINOPHEN 650 MG RE SUPP: 650.0000 mg | Freq: Four times a day (QID) | RECTAL | Status: DC | PRN

## 2017-07-13 MED ORDER — ATORVASTATIN CALCIUM 80 MG PO TABS
80.0000 mg | ORAL_TABLET | Freq: Every evening | ORAL | Status: DC
  Administered 2017-07-13 – 2017-07-14 (×2): 80 mg via ORAL
  Filled 2017-07-13 (×2): qty 1

## 2017-07-13 MED ORDER — FERROUS SULFATE 325 (65 FE) MG PO TABS
325.0000 mg | ORAL_TABLET | Freq: Every day | ORAL | Status: DC
  Administered 2017-07-14 – 2017-07-15 (×2): 325 mg via ORAL
  Filled 2017-07-13 (×2): qty 1

## 2017-07-13 NOTE — ED Provider Notes (Signed)
  Face-to-face evaluation   History: She presents for evaluation of syncope, which occurred suddenly, while she was standing.  No preceding symptoms.  History of similar 1 week ago, not completely evaluated.  History of coronary stenting, earlier this year.  Physical exam: Obese alert elderly female.  She is calm and comfortable.  Heart regular rate and rhythm no murmur lungs clear anteriorly.  Medical screening examination/treatment/procedure(s) were conducted as a shared visit with non-physician practitioner(s) and myself.  I personally evaluated the patient during the encounter    Daleen Bo, MD 07/14/17 1017

## 2017-07-13 NOTE — ED Notes (Signed)
Patient ambulated to the restroom with steady gait.  No complaints at this time

## 2017-07-13 NOTE — H&P (Signed)
History and Physical    Alicia [name] CNO:[id number] DOB: [birth year] DOA: 07/13/2017   PCP: Jearld Fenton, NP   Patient coming from:  Home    Chief Complaint: Syncope   HPI: Alicia [name] is a 71 y.o. female with medical history significant for DM. CKD, CAD s/p stent 02/2017, history of gastric bypass presenting to the ED after sustaining a syncopal episode while at the [employer]. SHe reported feeling dizzy, without nause, but with diaphoresis, and substernal chest pain radiating to the neck. This syncopal episode was witnessed by her sister, and lasted about 10 seconds. Upon waking up, the patient took one nitroglycerin and aspirin, with resolution of her symptoms. On questioning, the patient reports that she had a similar episode 2 weeks prior, and she presented to her PCP but failed to describe in detail her symptoms, concentrating on her fall and her foot pain   She was treated symptomatically, and given pain medication and discharged. During that prior episodes, the patient reports that had also travel with words, without vision changes headaches or any seizure activity. She is being admitted for further workup. She denies any shortness of breath cough or recent sick contacts. She denies any abdominal pain nausea or vomiting. She denies any recent infections. She denies any recent long distance trips. She denies any calf pain or lower extremity swelling. No confusion was reported. She is compliant with her medications. She denies any tobacco, alcohol or recreational drug use. No herbal or hormonal supplements.     ED Course:  BP 125/74   Pulse 80   Temp 98.4 F (36.9 C) (Oral)   Resp (!) 9   SpO2 100%    sodium 139 potassium 4.3 creatinine 1.9 with GFR 30  LFTs within normal. Bilirubin 0.7 troponin 0.08 white count 6.3  hemoglobin 9.5 (BL around 10)   platelets 216 Urine pending CT of the head without acute changes EKG sinus rhythm last echo December 2947 normal systolic function,  EF 65-46%. Last catheterization 03/18/2017 with progression of the diagonal lesion, status post stent  Review of Systems:  As per HPI otherwise all other systems reviewed and are negative  Past Medical History:  Diagnosis Date  . Anemia   . Arthritis    "right knee" (10/21/2015)  . CAD (coronary artery disease)    a. Cath 10/21/15: s/p DES to RCA: moderate diffuse stenosis of mod diagonal, diffuse irregularity LCx and LAD. 2D echo 10/21/15: mod LVH, EF 60-65%, no RWMA, calcified MV, mod-severe LAE. 03/18/17 PCI with DES--> 1 diag  . Cataract   . Essential hypertension   . Family history of adverse reaction to anesthesia    "oldest sister, [name], related to brain formation at back of head; when they put her to sleep it's hard to wake her up"  . GERD (gastroesophageal reflux disease)   . History of blood transfusion    "related to ruptured tubal pregnancy"  . Hyperlipidemia   . Hypertensive heart disease   . Morbid obesity ([place])   . Type II diabetes mellitus ([place])     Past Surgical History:  Procedure Laterality Date  . ABDOMINAL HYSTERECTOMY  1988  . APPENDECTOMY  1985  . [place] SURGERY  1984  . CARDIAC CATHETERIZATION N/A 10/21/2015   Procedure: Right/Left Heart Cath and Coronary Angiography;  Surgeon: [name], MD;  Location: [facility];  Service: Cardiovascular;  Laterality: N/A;  . CHOLECYSTECTOMY OPEN  1984  . CORONARY STENT INTERVENTION N/A 03/18/2017  Procedure: Coronary Stent Intervention;  Surgeon: [name], MD;  Location: [facility];  Service: Cardiovascular;  Laterality: N/A;  . CORONARY STENT PLACEMENT  03/18/2017   A OPTIMIZE STUDY Drug Eluting Stent (2.5 mm 18 mm - post-dilated to 2.7 mm) was successfully placed  . ECTOPIC PREGNANCY SURGERY    . LEFT HEART CATH AND CORONARY ANGIOGRAPHY N/A 03/18/2017   Procedure: Left Heart Cath and Coronary Angiography;  Surgeon: [name], MD;  Location: [facility];  Service:  Cardiovascular;  Laterality: N/A;  . REDUCTION MAMMAPLASTY Bilateral ~ 1976  . TONSILLECTOMY      Social History Social History   Social History  . Marital status: Divorced    Spouse name: N/A  . Number of children: 3  . Years of education: doctorate   Occupational History  . Teacher    Social History Main Topics  . Smoking status: Never Smoker  . Smokeless tobacco: Never Used  . Alcohol use No  . Drug use: No  . Sexual activity: Not Currently   Other Topics Concern  . Not on file   Social History Narrative   Caffeine use-yes   Regular exercise-no     Allergies  Allergen Reactions  . Iodinated Diagnostic Agents Hives  . Sulfa Antibiotics Hives    Family History  Problem Relation Age of Onset  . Diabetes Mother   . Heart attack Mother        2007  . Hypertension Mother   . Hyperlipidemia Mother   . Heart disease Mother   . Heart attack Father   . Stroke Father   . Hypertension Father   . Hyperlipidemia Father   . Stomach cancer Sister   . Diabetes Sister   . Diabetes Sister   . Diabetes Sister       Prior to Admission medications   Medication Sig Start Date End Date Taking? Authorizing Provider  acetaminophen (TYLENOL) 325 MG tablet Take 2 tablets (650 mg total) by mouth every 4 (four) hours as needed for headache or mild pain. 03/19/17  Yes Kilroy, Luke K, PA-C  amLODipine (NORVASC) 5 MG tablet Take 1 tablet (5 mg total) by mouth daily. 04/08/17 07/13/17 Yes Richardson Dopp T, PA-C  aspirin 81 MG tablet Take 81 mg by mouth daily.   Yes [provider]  atorvastatin (LIPITOR) 80 MG tablet TAKE 1 TABLET BY MOUTH EVERY EVENING Patient taking differently: TAKE 80 MG BY MOUTH EVERY EVENING 12/07/16  Yes Dunn, Dayna N, PA-C  carvedilol (COREG) 25 MG tablet Take 1 tablet (25 mg total) by mouth 2 (two) times daily. 04/16/17  Yes [name], MD  clopidogrel (PLAVIX) 75 MG tablet TAKE 1 TABLET (75 MG TOTAL) BY MOUTH DAILY. 10/31/16  Yes [name], MD    DiphenhydrAMINE HCl, Sleep, (UNISOM SLEEPGELS PO) Take 1 tablet by mouth at bedtime as needed (for sleep).    Yes [provider]  doxazosin (CARDURA) 4 MG tablet TAKE 1 TABLET (4 MG TOTAL) BY MOUTH AT BEDTIME. 06/10/17  Yes 34, Coralie Keens, NP  ferrous sulfate 325 (65 FE) MG tablet TAKE 1 TABLET (325 MG TOTAL) BY MOUTH 3 (THREE) TIMES DAILY WITH MEALS. 06/06/17  Yes Baity, Coralie Keens, NP  furosemide (LASIX) 20 MG tablet TAKE 1 TABLET (20 MG TOTAL) BY MOUTH DAILY. 06/10/17  Yes [name], MD  glipiZIDE (GLUCOTROL) 10 MG tablet TAKE 1 TABLET (10 MG TOTAL) BY MOUTH 2 (TWO) TIMES DAILY BEFORE A  MEAL. 05/27/17  Yes Baity, Coralie Keens, NP  isosorbide mononitrate (IMDUR) 30 MG 24 hr tablet Take 1 tablet (30 mg total) by mouth daily. 03/15/17 07/13/17 Yes Weaver, Scott T, PA-C  losartan (COZAAR) 100 MG tablet TAKE 1 TABLET (100 MG TOTAL) BY MOUTH DAILY. 05/27/17  Yes Baity, Coralie Keens, NP  metFORMIN (GLUCOPHAGE) 1000 MG tablet Take 1 tablet (1,000 mg total) by mouth 2 (two) times daily with a meal. 03/21/17  Yes Kilroy, Luke K, PA-C  Multiple Vitamins-Minerals (MULTIVITAMIN WITH MINERALS) tablet Take 1 tablet by mouth daily.   Yes [provider]  nitroGLYCERIN (NITROSTAT) 0.4 MG SL tablet Place 1 tablet (0.4 mg total) under the tongue every 5 (five) minutes as needed for chest pain (up to 3 doses). 10/22/15  Yes Dunn, Dayna N, PA-C  sitaGLIPtin (JANUVIA) 100 MG tablet Take 1 tablet (100 mg total) by mouth daily. 06/07/17  Yes Jearld Fenton, NP  spironolactone (ALDACTONE) 25 MG tablet TAKE 1 TABLET (25 MG TOTAL) BY MOUTH DAILY. 05/27/17  Yes Jearld Fenton, NP  VICTOZA 18 MG/3ML SOPN INJECT 0.2 MLS (1.2 MG TOTAL) INTO THE SKIN DAILY 06/12/17  Yes Jearld Fenton, NP    Physical Exam:  Vitals:   07/13/17 1215 07/13/17 1345 07/13/17 1415 07/13/17 1500  BP: (!) 143/78 127/79 129/75 125/74  Pulse: 79 77 78 80  Resp: 14 14 12  (!) 9  Temp:      TempSrc:      SpO2: 96% 100% 100% 100%    Constitutional: NAD, calm, comfortable Eyes: PERRL, lids and conjunctivae normal ENMT: Mucous membranes are moist, without exudate or lesions  Neck: normal, supple, no masses, no thyromegaly Respiratory: clear to auscultation bilaterally, no wheezing, no crackles. Normal respiratory effort  Cardiovascular: Regular rate and rhythm occasional ectopic beat, soft 1/6 systolic murmur, rubs or gallops. No extremity edema. 2+ pedal pulses. No carotid bruits.  Abdomen: Soft, obese non tender, No hepatosplenomegaly. Bowel sounds positive.  Musculoskeletal: no clubbing / cyanosis. Moves all extremities Skin: no jaundice, No lesions.  Neurologic: Sensation intact  Strength equal in all extremities Psychiatric:   Alert and oriented x 3. Normal mood.     Labs on Admission: I have personally reviewed following labs and imaging studies  CBC:  Recent Labs Lab 07/13/17 1355  WBC 6.3  HGB 9.5*  HCT 30.6*  MCV 72.9*  PLT 825    Basic Metabolic Panel:  Recent Labs Lab 07/13/17 1355  NA 139  K 4.3  CL 104  CO2 24  GLUCOSE 95  BUN 27*  CREATININE 1.90*  CALCIUM 8.8*    GFR: CrCl cannot be calculated (Unknown ideal weight.).  Liver Function Tests:  Recent Labs Lab 07/13/17 1355  AST 21  ALT 15  ALKPHOS 65  BILITOT 0.7  PROT 6.1*  ALBUMIN 3.6   No results for input(s): LIPASE, AMYLASE in the last 168 hours. No results for input(s): AMMONIA in the last 168 hours.  Coagulation Profile: No results for input(s): INR, PROTIME in the last 168 hours.  Cardiac Enzymes: No results for input(s): CKTOTAL, CKMB, CKMBINDEX, TROPONINI in the last 168 hours.  BNP (last 3 results) No results for input(s): PROBNP in the last 8760 hours.  HbA1C: No results for input(s): HGBA1C in the last 72 hours.  CBG:  Recent Labs Lab 07/10/17 1329  GLUCAP 192*    Lipid Profile: No results for input(s): CHOL, HDL, LDLCALC, TRIG, CHOLHDL, LDLDIRECT in the last 72 hours.  Thyroid  Function  Tests: No results for input(s): TSH, T4TOTAL, FREET4, T3FREE, THYROIDAB in the last 72 hours.  Anemia Panel: No results for input(s): VITAMINB12, FOLATE, FERRITIN, TIBC, IRON, RETICCTPCT in the last 72 hours.  Urine analysis:    Component Value Date/Time   COLORURINE STRAW (A) 07/13/2017 1517   APPEARANCEUR CLEAR 07/13/2017 1517   LABSPEC 1.009 07/13/2017 1517   PHURINE 5.0 07/13/2017 1517   GLUCOSEU NEGATIVE 07/13/2017 1517   HGBUR NEGATIVE 07/13/2017 1517   BILIRUBINUR NEGATIVE 07/13/2017 1517   KETONESUR NEGATIVE 07/13/2017 1517   PROTEINUR NEGATIVE 07/13/2017 1517   UROBILINOGEN 0.2 10/12/2011 1726   NITRITE NEGATIVE 07/13/2017 1517   LEUKOCYTESUR NEGATIVE 07/13/2017 1517    Sepsis Labs: @LABRCNTIP (procalcitonin:4,lacticidven:4) )No results found for this or any previous visit (from the past 240 hour(s)).   Radiological Exams on Admission: Dg Chest 2 View  Result Date: 07/13/2017 CLINICAL DATA:  Syncope, chest pressure EXAM: CHEST  2 VIEW COMPARISON:  10/17/2015 FINDINGS: Lungs are clear.  No pleural effusion or pneumothorax. The heart is normal in size. Degenerative changes of the visualized thoracolumbar spine. IMPRESSION: Normal chest radiographs. Electronically Signed   By: Julian Hy M.D.   On: 07/13/2017 13:43   Ct Head Wo Contrast  Result Date: 07/13/2017 CLINICAL DATA:  Recurrent syncope, pain above right eye EXAM: CT HEAD WITHOUT CONTRAST TECHNIQUE: Contiguous axial images were obtained from the base of the skull through the vertex without intravenous contrast. COMPARISON:  None. FINDINGS: Brain: No evidence of acute infarction, hemorrhage, hydrocephalus, extra-axial collection or mass lesion/mass effect. Vascular: Intracranial atherosclerosis. Skull: Normal. Negative for fracture or focal lesion. Sinuses/Orbits: Mild partial opacification of the bilateral ethmoid sinuses. Visualized paranasal sinuses and mastoid air cells are otherwise clear. Other:  None. IMPRESSION: Normal head CT. Electronically Signed   By: Julian Hy M.D.   On: 07/13/2017 13:43    EKG: Independently reviewed.  Assessment/Plan Active Problems:   Syncope   DM (diabetes mellitus), type 2, uncontrolled (HCC)   Gastric bypass status for obesity   Hyperlipidemia   GERD (gastroesophageal reflux disease)   CAD S/P percutaneous coronary angioplasty   Essential hypertension   Microcytic anemia   Morbid obesity (HCC)   CKD (chronic kidney disease) stage 3, GFR 30-59 ml/min   Progressive angina (HCC)   Recurrent Syncope with chest pain, unclear etiology, rule out cardiac versus neurological. Doubt infectious source. Afebrile. UA negative. WBC 6.3  EKG SR no apparent ACS, Tn 0.08 . Neuro exam US unremarkable, however, she may have experienced stroke like symptoms surrounding her prior syncopal episode 2 weeks ago. ast echo December 2878 normal systolic function, EF 67-67%.CT of the head without acute changes. Currently chest pain free . Risk factors for heart disease and stroke include age, strong family history of cardiac and CVA, HTN, HLD, SDM, CAD  Syncope order set / TIA focus order set admit for observation - Tele bed. Noncontrast MRI  of head 2 D echo IV fluids EKG in am Hold Beta blockers and other BP meds due to syncope and until MRI clears stroke. Cardiology to see   CAD,   last cardiac catheterization 03/18/2017 with progression of the diagonal lesion, status post stent.  Continue ASA, Plavix high dose statin, beta blockers in am as above  Cardiology to see, appreciate involvement    Type II Diabetes Current blood sugar level is 95 (has not eaten and has taken her meds prior to admissiion) Lab Results  Component Value Date   HGBA1C 8.1 (H) 06/06/2017  Hold home oral diabetic medications.   SSI   Hypertension BP 125/74   Pulse 80   Continue home anti-hypertensive medications in am  Add Hydralazine Q6 hours as needed for BP  210/110  Hyperlipidemia Continue home statins Check lipid panel     Anemia of chronic disease/ mycrocytic  Hemoglobin on admission  9.5, baseline 10 no bleeding issues noted. MCV 72  Repeat CBC in am  No transfusion is indicated at this time Anemia panel  Continue Iron supplements  Acute Chronic kidney disease stage 3-4 currently at 1.9 BL 1.4  Lab Results  Component Value Date   CREATININE 1.90 (H) 07/13/2017   CREATININE 1.46 (H) 04/08/2017   CREATININE 1.21 (H) 03/19/2017   IVF Hold diuretics and NSAIDS  Repeat CMET in am  DVT prophylaxis:  Heparin   Code Status:   Full      Family Communication:  Discussed with patient Disposition Plan: Expect patient to be discharged to home after condition improves Consults called:    Cardiology by EDP  Admission status:Tele  Obs    [facility] E, PA-C [facility]   07/13/2017, 4:21 PM

## 2017-07-13 NOTE — Progress Notes (Signed)
Report received from ED nurse. Pt arrived via bed at 1715.

## 2017-07-13 NOTE — ED Triage Notes (Signed)
Patient, who is a Network engineer, was at work today and had a syncopal episode of 10 second duration when she stood up.  This was followed by C/O chest pressure.  EMS reports that patient experienced this last week.   She was given 324 mg asa and received 100 cc ns.  Patient took 1 SL NTG of her own.  Patient has a cardiac history and has had stents placed.

## 2017-07-13 NOTE — ED Provider Notes (Signed)
[facility] DEPT Provider Note   CSN: [id number] Arrival date & time: 07/13/17  1108     History   Chief Complaint Chief Complaint  Patient presents with  . Loss of Consciousness  . Chest Pain    HPI Alicia [name] is a 71 y.o. female with a history of DM, CKD, CAD s/p stent x2, and gastric bypass surgery presents to the emergency department with a chief complaint of syncope. She reports she was feeling dizzy while standing at the counter at a retail store when she had an approximately 10 second witnessed syncopal episode earlier today. She denies shaking or jerking. No postictal period. She states that following the syncopal episode, she began to have squeezing, central chest pressure, which resolved approximatly 5 minutes after taking a nitroglycerin tablet. She denies a history of recent episodes of exertional or nonexertional chest pain. She denies dyspnea, diaphoresis, or weakness. She also reports right leg numbness that has been presents and unchanging for the last few months.  She reports a previous syncopal episode on 8/8, but reports prior to that episode she was having searching for words and completing her sentences. She denies a h/o of similar symptoms associated with this syncopal episode.   She is currently in cardiac rehabilitation after having a stent placed in April 2018. She reports that she was recently started on Januvia about 1 month ago. No recent medication or dosing changes.   The history is provided by the patient. No language interpreter was used.    Past Medical History:  Diagnosis Date  . Anemia   . Arthritis    "right knee" (10/21/2015)  . CAD (coronary artery disease)    a. Cath 10/21/15: s/p DES to RCA: moderate diffuse stenosis of mod diagonal, diffuse irregularity LCx and LAD. 2D echo 10/21/15: mod LVH, EF 60-65%, no RWMA, calcified MV, mod-severe LAE. 03/18/17 PCI with DES--> 1 diag  . Cataract   . Essential hypertension   . Family history of  adverse reaction to anesthesia    "oldest sister, [name], related to brain formation at back of head; when they put her to sleep it's hard to wake her up"  . GERD (gastroesophageal reflux disease)   . History of blood transfusion    "related to ruptured tubal pregnancy"  . Hyperlipidemia   . Hypertensive heart disease   . Morbid obesity ([place])   . Type II diabetes mellitus [facility])     Patient Active Problem List   Diagnosis Date Noted  . Syncope 07/13/2017  . Progressive angina ([place])   . CKD (chronic kidney disease) stage 3, GFR 30-59 ml/min 03/15/2017  . CAD S/P percutaneous coronary angioplasty 10/22/2015  . Essential hypertension 10/22/2015  . Microcytic anemia 10/22/2015  . Morbid obesity ([place]) 10/22/2015  . Hyperlipidemia 10/09/2012  . GERD (gastroesophageal reflux disease) 10/09/2012  . Adenomatous colon polyp 09/24/2012  . DM (diabetes mellitus), type 2, uncontrolled ([place]) 10/12/2011  . Gastric bypass status for obesity 10/12/2011    Past Surgical History:  Procedure Laterality Date  . ABDOMINAL HYSTERECTOMY  1988  . APPENDECTOMY  1985  . [place] SURGERY  1984  . CARDIAC CATHETERIZATION N/A 10/21/2015   Procedure: Right/Left Heart Cath and Coronary Angiography;  Surgeon: [name], MD;  Location: [facility];  Service: Cardiovascular;  Laterality: N/A;  . CHOLECYSTECTOMY OPEN  1984  . CORONARY STENT INTERVENTION N/A 03/18/2017   Procedure: Coronary Stent Intervention;  Surgeon: [name], MD;  Location: [facility];  Service: Cardiovascular;  Laterality: N/A;  . CORONARY STENT PLACEMENT  03/18/2017   A OPTIMIZE STUDY Drug Eluting Stent (2.5 mm 18 mm - post-dilated to 2.7 mm) was successfully placed  . ECTOPIC PREGNANCY SURGERY    . LEFT HEART CATH AND CORONARY ANGIOGRAPHY N/A 03/18/2017   Procedure: Left Heart Cath and Coronary Angiography;  Surgeon: [name], MD;  Location: [facility];  Service: Cardiovascular;  Laterality: N/A;  .  REDUCTION MAMMAPLASTY Bilateral ~ 1976  . TONSILLECTOMY      OB History    No data available       Home Medications    Prior to Admission medications   Medication Sig Start Date End Date Taking? Authorizing Provider  acetaminophen (TYLENOL) 325 MG tablet Take 2 tablets (650 mg total) by mouth every 4 (four) hours as needed for headache or mild pain. 03/19/17  Yes Kilroy, Luke K, PA-C  amLODipine (NORVASC) 5 MG tablet Take 1 tablet (5 mg total) by mouth daily. 04/08/17 07/13/17 Yes Richardson Dopp T, PA-C  aspirin 81 MG tablet Take 81 mg by mouth daily.   Yes [provider]  atorvastatin (LIPITOR) 80 MG tablet TAKE 1 TABLET BY MOUTH EVERY EVENING Patient taking differently: TAKE 80 MG BY MOUTH EVERY EVENING 12/07/16  Yes Dunn, Dayna N, PA-C  carvedilol (COREG) 25 MG tablet Take 1 tablet (25 mg total) by mouth 2 (two) times daily. 04/16/17  Yes [name], MD  clopidogrel (PLAVIX) 75 MG tablet TAKE 1 TABLET (75 MG TOTAL) BY MOUTH DAILY. 10/31/16  Yes [name], MD  DiphenhydrAMINE HCl, Sleep, (UNISOM SLEEPGELS PO) Take 1 tablet by mouth at bedtime as needed (for sleep).    Yes [provider]  doxazosin (CARDURA) 4 MG tablet TAKE 1 TABLET (4 MG TOTAL) BY MOUTH AT BEDTIME. 06/10/17  Yes 15, Coralie Keens, NP  ferrous sulfate 325 (65 FE) MG tablet TAKE 1 TABLET (325 MG TOTAL) BY MOUTH 3 (THREE) TIMES DAILY WITH MEALS. 06/06/17  Yes Baity, Coralie Keens, NP  furosemide (LASIX) 20 MG tablet TAKE 1 TABLET (20 MG TOTAL) BY MOUTH DAILY. 06/10/17  Yes [name], MD  glipiZIDE (GLUCOTROL) 10 MG tablet TAKE 1 TABLET (10 MG TOTAL) BY MOUTH 2 (TWO) TIMES DAILY BEFORE A MEAL. 05/27/17  Yes Baity, Coralie Keens, NP  isosorbide mononitrate (IMDUR) 30 MG 24 hr tablet Take 1 tablet (30 mg total) by mouth daily. 03/15/17 07/13/17 Yes Weaver, Scott T, PA-C  losartan (COZAAR) 100 MG tablet TAKE 1 TABLET (100 MG TOTAL) BY MOUTH DAILY. 05/27/17  Yes Baity, Coralie Keens, NP  metFORMIN (GLUCOPHAGE) 1000 MG tablet  Take 1 tablet (1,000 mg total) by mouth 2 (two) times daily with a meal. 03/21/17  Yes Kilroy, Luke K, PA-C  Multiple Vitamins-Minerals (MULTIVITAMIN WITH MINERALS) tablet Take 1 tablet by mouth daily.   Yes [provider]  nitroGLYCERIN (NITROSTAT) 0.4 MG SL tablet Place 1 tablet (0.4 mg total) under the tongue every 5 (five) minutes as needed for chest pain (up to 3 doses). 10/22/15  Yes Dunn, Dayna N, PA-C  sitaGLIPtin (JANUVIA) 100 MG tablet Take 1 tablet (100 mg total) by mouth daily. 06/07/17  Yes Jearld Fenton, NP  spironolactone (ALDACTONE) 25 MG tablet TAKE 1 TABLET (25 MG TOTAL) BY MOUTH DAILY. 05/27/17  Yes Jearld Fenton, NP  VICTOZA 18 MG/3ML SOPN INJECT 0.2 MLS (1.2 MG TOTAL) INTO THE SKIN DAILY 06/12/17  Yes Jearld Fenton, NP  Family History Family History  Problem Relation Age of Onset  . Diabetes Mother   . Heart attack Mother        2007  . Hypertension Mother   . Hyperlipidemia Mother   . Heart disease Mother   . Heart attack Father   . Stroke Father   . Hypertension Father   . Hyperlipidemia Father   . Stomach cancer Sister   . Diabetes Sister   . Diabetes Sister   . Diabetes Sister     Social History Social History  Substance Use Topics  . Smoking status: Never Smoker  . Smokeless tobacco: Never Used  . Alcohol use No     Allergies   Iodinated diagnostic agents and Sulfa antibiotics   Review of Systems Review of Systems  Constitutional: Negative for activity change, chills and fever.  HENT: Negative for congestion.   Eyes: Negative for visual disturbance.  Respiratory: Negative for shortness of breath.   Cardiovascular: Positive for chest pain.  Gastrointestinal: Negative for abdominal pain.  Genitourinary: Negative for dysuria.  Musculoskeletal: Negative for back pain.  Skin: Negative for rash.  Allergic/Immunologic: Positive for immunocompromised state.  Neurological: Positive for syncope.  Hematological: Negative for adenopathy.    Psychiatric/Behavioral: Negative for confusion.    Physical Exam Updated Vital Signs BP 125/74   Pulse 80   Temp 98.4 F (36.9 C) (Oral)   Resp (!) 9   SpO2 100%   Physical Exam  Constitutional: She is oriented to person, place, and time. No distress.  Morbidly obese female resting comfortably sitting in bed.  HENT:  Head: Normocephalic.  Eyes: Conjunctivae are normal.  Neck: Neck supple.  Cardiovascular: Normal rate, regular rhythm, normal heart sounds and intact distal pulses.  Exam reveals no gallop and no friction rub.   No murmur heard. Pulmonary/Chest: Effort normal and breath sounds normal. No respiratory distress. She has no wheezes. She has no rales.  Abdominal: Soft. Bowel sounds are normal. She exhibits no distension. There is no tenderness. There is no rebound and no guarding.  Musculoskeletal: Normal range of motion. She exhibits no edema.  No lower extremity edema or lesions.  Neurological: She is alert and oriented to person, place, and time.  Ambulatory without difficulty.   Skin: Skin is warm. Capillary refill takes less than 2 seconds. No rash noted.  Psychiatric: Her behavior is normal.  Nursing note and vitals reviewed.  ED Treatments / Results  Labs (all labs ordered are listed, but only abnormal results are displayed) Labs Reviewed  COMPREHENSIVE METABOLIC PANEL - Abnormal; Notable for the following:       Result Value   BUN 27 (*)    Creatinine, Ser 1.90 (*)    Calcium 8.8 (*)    Total Protein 6.1 (*)    GFR calc non Af Amer 26 (*)    GFR calc Af Amer 30 (*)    All other components within normal limits  CBC - Abnormal; Notable for the following:    Hemoglobin 9.5 (*)    HCT 30.6 (*)    MCV 72.9 (*)    MCH 22.6 (*)    All other components within normal limits  URINALYSIS, ROUTINE W REFLEX MICROSCOPIC - Abnormal; Notable for the following:    Color, Urine STRAW (*)    All other components within normal limits  TROPONIN I  TROPONIN I   TROPONIN I  TROPONIN I  VITAMIN B12  FOLATE  IRON AND TIBC  FERRITIN  RETICULOCYTES  I-STAT TROPONIN, ED    EKG  EKG Interpretation  Date/Time:  Saturday July 13 2017 11:16:10 EDT Ventricular Rate:  74 PR Interval:    QRS Duration: 82 QT Interval:  408 QTC Calculation: 453 R Axis:   38 Text Interpretation:  Sinus rhythm Low voltage, precordial leads Abnormal R-wave progression, early transition since last tracing no significant change Confirmed by Daleen Bo [phone]) on 07/13/2017 1:54:36 PM       Radiology Dg Chest 2 View  Result Date: 07/13/2017 CLINICAL DATA:  Syncope, chest pressure EXAM: CHEST  2 VIEW COMPARISON:  10/17/2015 FINDINGS: Lungs are clear.  No pleural effusion or pneumothorax. The heart is normal in size. Degenerative changes of the visualized thoracolumbar spine. IMPRESSION: Normal chest radiographs. Electronically Signed   By: Julian Hy M.D.   On: 07/13/2017 13:43   Ct Head Wo Contrast  Result Date: 07/13/2017 CLINICAL DATA:  Recurrent syncope, pain above right eye EXAM: CT HEAD WITHOUT CONTRAST TECHNIQUE: Contiguous axial images were obtained from the base of the skull through the vertex without intravenous contrast. COMPARISON:  None. FINDINGS: Brain: No evidence of acute infarction, hemorrhage, hydrocephalus, extra-axial collection or mass lesion/mass effect. Vascular: Intracranial atherosclerosis. Skull: Normal. Negative for fracture or focal lesion. Sinuses/Orbits: Mild partial opacification of the bilateral ethmoid sinuses. Visualized paranasal sinuses and mastoid air cells are otherwise clear. Other: None. IMPRESSION: Normal head CT. Electronically Signed   By: Julian Hy M.D.   On: 07/13/2017 13:43    Procedures Procedures (including critical care time)  Medications Ordered in ED Medications  furosemide (LASIX) tablet 20 mg (not administered)  ferrous sulfate tablet 325 mg (not administered)  losartan (COZAAR) tablet 100 mg  (not administered)  spironolactone (ALDACTONE) tablet 25 mg (not administered)  carvedilol (COREG) tablet 25 mg (not administered)  amLODipine (NORVASC) tablet 5 mg (not administered)  isosorbide mononitrate (IMDUR) 24 hr tablet 30 mg (not administered)  atorvastatin (LIPITOR) tablet 80 mg (not administered)  clopidogrel (PLAVIX) tablet 75 mg (not administered)  aspirin tablet 81 mg (not administered)  sodium chloride flush (NS) 0.9 % injection 3 mL (not administered)  heparin injection 5,000 Units (not administered)  0.9 %  sodium chloride infusion (not administered)  acetaminophen (TYLENOL) tablet 650 mg (not administered)    Or  acetaminophen (TYLENOL) suppository 650 mg (not administered)  insulin aspart (novoLOG) injection 0-9 Units (not administered)  doxazosin (CARDURA) tablet 4 mg (not administered)  hydrALAZINE (APRESOLINE) injection 5-10 mg (not administered)     Initial Impression / Assessment and Plan / ED Course  I have reviewed the triage vital signs and the nursing notes.  Pertinent labs & imaging results that were available during my care of the patient were reviewed by me and considered in my medical decision making (see chart for details).     71 year old female presenting with a syncopal episode followed by Central chest pain which resolved after taking nitroglycerin. The patient was seen and evaluated by Dr. Eulis Foster, attending physician. CT chest x-ray unremarkable. Initial troponin 0.08; second troponin pending. Cr 1.90; BUN 27; BUN/CR <20:1. No other electrolyte abnormalities. UA unremarkable. EKG with qtc 453; 480 noted on monitor in the room. The patient is currently undergoing cardiac rehabilitation for a stent placed in the first diagonal branch on 4/30 and previous RCA stent placement on 12/16. ECHO 2016 with EF 60-65%. Given the patient's history of CAD, 2 recent syncopal episodes, and AKI, consulted Dr. Evangeline Gula with the hospitalist team who will admit the patient  for continued  workup and evaluation. Per Dr. Evangeline Gula, consulted cardiology and spoke with Dr. Radford Pax who recommended trending the patient's troponin and re-consulting cardiology in the AM if further evaluation is needed. The patient appears reasonably stabilized for admission considering the current resources, flow, and capabilities available in the ED at this time, and I doubt any other [facility] requiring further screening and/or treatment in the ED prior to admission.  Final Clinical Impressions(s) / ED Diagnoses   Final diagnoses:  Syncope, unspecified syncope type  Chest pain, unspecified type    New Prescriptions New Prescriptions   No medications on file     Joanne Gavel, PA-C 07/13/17 1651    Daleen Bo, MD 07/14/17 1017

## 2017-07-14 ENCOUNTER — Observation Stay (HOSPITAL_COMMUNITY): Payer: Medicare Other

## 2017-07-14 ENCOUNTER — Observation Stay (HOSPITAL_BASED_OUTPATIENT_CLINIC_OR_DEPARTMENT_OTHER): Payer: Medicare Other

## 2017-07-14 DIAGNOSIS — I10 Essential (primary) hypertension: Secondary | ICD-10-CM

## 2017-07-14 DIAGNOSIS — R55 Syncope and collapse: Secondary | ICD-10-CM

## 2017-07-14 DIAGNOSIS — D509 Iron deficiency anemia, unspecified: Secondary | ICD-10-CM

## 2017-07-14 DIAGNOSIS — Z9861 Coronary angioplasty status: Secondary | ICD-10-CM | POA: Diagnosis not present

## 2017-07-14 DIAGNOSIS — N179 Acute kidney failure, unspecified: Secondary | ICD-10-CM | POA: Diagnosis not present

## 2017-07-14 DIAGNOSIS — I2510 Atherosclerotic heart disease of native coronary artery without angina pectoris: Secondary | ICD-10-CM

## 2017-07-14 DIAGNOSIS — R079 Chest pain, unspecified: Secondary | ICD-10-CM

## 2017-07-14 DIAGNOSIS — N183 Chronic kidney disease, stage 3 (moderate): Secondary | ICD-10-CM

## 2017-07-14 LAB — COMPREHENSIVE METABOLIC PANEL
ALK PHOS: 59 U/L (ref 38–126)
ALT: 14 U/L (ref 14–54)
ANION GAP: 10 (ref 5–15)
AST: 17 U/L (ref 15–41)
Albumin: 3.3 g/dL — ABNORMAL LOW (ref 3.5–5.0)
BILIRUBIN TOTAL: 0.7 mg/dL (ref 0.3–1.2)
BUN: 27 mg/dL — ABNORMAL HIGH (ref 6–20)
CALCIUM: 8.8 mg/dL — AB (ref 8.9–10.3)
CO2: 26 mmol/L (ref 22–32)
Chloride: 106 mmol/L (ref 101–111)
Creatinine, Ser: 1.61 mg/dL — ABNORMAL HIGH (ref 0.44–1.00)
GFR, EST AFRICAN AMERICAN: 37 mL/min — AB (ref >60)
GFR, EST NON AFRICAN AMERICAN: 32 mL/min — AB (ref >60)
Glucose, Bld: 86 mg/dL (ref 65–99)
Potassium: 3.7 mmol/L (ref 3.5–5.1)
SODIUM: 142 mmol/L (ref 135–145)
TOTAL PROTEIN: 6 g/dL — AB (ref 6.5–8.1)

## 2017-07-14 LAB — VAS US CAROTID
LCCADDIAS: -24 cm/s
LCCAPSYS: 149 cm/s
LEFT ECA DIAS: -4 cm/s
LEFT VERTEBRAL DIAS: 22 cm/s
LICADSYS: -80 cm/s
Left CCA dist sys: -90 cm/s
Left CCA prox dias: 29 cm/s
Left ICA dist dias: -32 cm/s
Left ICA prox dias: 10 cm/s
Left ICA prox sys: 71 cm/s
RCCADSYS: -89 cm/s
RCCAPDIAS: 17 cm/s
RCCAPSYS: 94 cm/s
RIGHT ECA DIAS: -7 cm/s
RIGHT VERTEBRAL DIAS: 19 cm/s

## 2017-07-14 LAB — CBC
HCT: 29.9 % — ABNORMAL LOW (ref 36.0–46.0)
HEMOGLOBIN: 9.2 g/dL — AB (ref 12.0–15.0)
MCH: 22.5 pg — AB (ref 26.0–34.0)
MCHC: 30.8 g/dL (ref 30.0–36.0)
MCV: 73.3 fL — AB (ref 78.0–100.0)
Platelets: 223 10*3/uL (ref 150–400)
RBC: 4.08 MIL/uL (ref 3.87–5.11)
RDW: 15.4 % (ref 11.5–15.5)
WBC: 5.1 10*3/uL (ref 4.0–10.5)

## 2017-07-14 LAB — GLUCOSE, CAPILLARY
GLUCOSE-CAPILLARY: 148 mg/dL — AB (ref 65–99)
Glucose-Capillary: 102 mg/dL — ABNORMAL HIGH (ref 65–99)
Glucose-Capillary: 136 mg/dL — ABNORMAL HIGH (ref 65–99)
Glucose-Capillary: 79 mg/dL (ref 65–99)

## 2017-07-14 LAB — PROTIME-INR
INR: 1.15
PROTHROMBIN TIME: 14.7 s (ref 11.4–15.2)

## 2017-07-14 LAB — LIPID PANEL
CHOLESTEROL: 124 mg/dL (ref 0–200)
HDL: 36 mg/dL — ABNORMAL LOW (ref >40)
LDL Cholesterol: 77 mg/dL (ref 0–99)
Total CHOL/HDL Ratio: 3.4 RATIO
Triglycerides: 56 mg/dL (ref <150)
VLDL: 11 mg/dL (ref 0–40)

## 2017-07-14 MED ORDER — LORAZEPAM 0.5 MG PO TABS
0.5000 mg | ORAL_TABLET | Freq: Once | ORAL | Status: AC
  Administered 2017-07-14: 0.5 mg via ORAL
  Filled 2017-07-14: qty 1

## 2017-07-14 MED ORDER — LORAZEPAM 2 MG/ML IJ SOLN
2.0000 mg | Freq: Once | INTRAMUSCULAR | Status: AC
  Administered 2017-07-15: 2 mg via INTRAVENOUS
  Filled 2017-07-14: qty 1

## 2017-07-14 NOTE — Progress Notes (Signed)
Pt  had a failed MRI exam today with 0.5mg  Ativan PO. MRI advised to give about 2mg  IV PRN ATIVAN prior for another attempt. NP on call was notified. Will continue to monitor.

## 2017-07-14 NOTE — Progress Notes (Signed)
*  PRELIMINARY RESULTS* Vascular Ultrasound Carotid Duplex (Doppler) has been completed.  Preliminary findings: Right 1-39% ICA stenosis. Left 40-59% ICA stenosis.  Antegrade vertebral flow.   Landry Mellow, RDMS, RVT  07/14/2017, 10:17 AM

## 2017-07-14 NOTE — Progress Notes (Signed)
PROGRESS NOTE  Alicia [name] GQB:[id number] DOB: Sep 20, 1946 DOA: 07/13/2017 PCP: Jearld Fenton, NP  HPI/Recap of past 24 hours:  Report feeling weak, chest pain has resolved, no fever, no cough, no sob, no edema Report entire right side is feeling weak  Assessment/Plan: Active Problems:   DM (diabetes mellitus), type 2, uncontrolled (H[facility])   Gastric bypass status for obesity   Hyperlipidemia   GERD (gastroesophageal reflux disease)   CAD S/P percutaneous coronary angioplasty   Essential hypertension   Microcytic anemia   Morbid obesity ([facility])   CKD (chronic kidney disease) stage 3, GFR 30-59 ml/min   Progressive angina ([facility])   Syncope  Recurrent Syncope with chest pain, unclear etiology, rule out cardiac versus neurological.  -Doubt infectious source. Afebrile. UA negative. W - EKG SR no apparent ACS, Tn 0.08 . Currently chest pain free -Neuro exam US unremarkable, -CT of the head without acute changes.  - . Risk factors for heart disease and stroke include age, strong family history of cardiac and CVA, HTN, HLD, SDM, CAD  -Syncope order set / TIA focus order set -tele so far unrevealing, will continue tele, I have messaged cardiology to arrange outpatient holter monitor to r/o arrhythmia -Noncontrast MRI  of head pending -2 D echo pending -she does not have orthostasis, will d/c ivf, continue hold dirutetics   CAD,   last cardiac catheterization 03/18/2017 with progression of the diagonal lesion, status post stent in 02/2017.  Continue ASA, Plavix high dose statin, beta blockers in am as above  She presented with chest pain, chest pain has resolved,Troponin negative,   EDP contacted Cardiology  Dr Radford Pax who states not need of cardiology inpatient consult if troponin is negative.   Acute on Chronic kidney disease stage 3-4  Cr 1.9 on admission, cr 1.6 after hydration. D/c ivf , hold home meds diuretics and NSAIDS  Repeat CMET in am ua unremarkable  noninsulin  dependentType II Diabetes  recent a1c 7.1 on metformin, victoza at home, she report [place] started a month ago, home meds held, On ssi here, am blood sugar 86,     Hypertension  bp stable, Continue home anti-hypertensive medications (coreg, imdur, cardura, norvasc, losartan), hold lasix /spironolactone   Hyperlipidemia Continue home statins lipid panel  ldl 77, hdl 36  Anemia of chronic disease/ microcytic  Hemoglobin on admission  9.5, baseline 10 no bleeding issues noted. MCV 72  No transfusion is indicated at this time Anemia panel  Continue Iron supplements  Morbid obesity remote history of gastric bypass in the 80's Body mass index is 49.09 kg/m. She report recent weight loss with ongoing cardiopulmonary rehab  DVT prophylaxis:  Heparin   Code Status:   Full      Family Communication:  Discussed with patient Disposition Plan: Expect patient to be discharged to home after condition improves Consults called:    EDP contacted Cardiology  Dr Radford Pax who states not need of cardiology inpatient consult if troponin is negative.   Procedures:  none  Antibiotics:  none   Objective: BP 130/73   Pulse 74   Temp 98.2 F (36.8 C) (Oral)   Resp 20   Wt 134.9 kg (297 lb 4.8 oz)   SpO2 100%   BMI 49.09 kg/m   Intake/Output Summary (Last 24 hours) at 07/14/17 1828 Last data filed at 07/14/17 1300  Gross per 24 hour  Intake          1221.25 ml  Output  0 ml  Net          1221.25 ml   Filed Weights   07/14/17 0500  Weight: 134.9 kg (297 lb 4.8 oz)    Exam: Patient is examined daily including today on 07/14/2017, exams remain the same as of yesterday except that has changed    General:  NAD, obese  Cardiovascular: RRR  Respiratory: CTABL  Abdomen: Soft/ND/NT, positive BS  Musculoskeletal: No Edema  Neuro: alert, oriented   Data Reviewed: Basic Metabolic Panel:  Recent Labs Lab 07/13/17 1355 07/14/17 0638  NA 139 142  K 4.3 3.7    CL 104 106  CO2 24 26  GLUCOSE 95 86  BUN 27* 27*  CREATININE 1.90* 1.61*  CALCIUM 8.8* 8.8*   Liver Function Tests:  Recent Labs Lab 07/13/17 1355 07/14/17 0638  AST 21 17  ALT 15 14  ALKPHOS 65 59  BILITOT 0.7 0.7  PROT 6.1* 6.0*  ALBUMIN 3.6 3.3*   No results for input(s): LIPASE, AMYLASE in the last 168 hours. No results for input(s): AMMONIA in the last 168 hours. CBC:  Recent Labs Lab 07/13/17 1355 07/14/17 0638  WBC 6.3 5.1  HGB 9.5* 9.2*  HCT 30.6* 29.9*  MCV 72.9* 73.3*  PLT 216 223   Cardiac Enzymes:    Recent Labs Lab 07/13/17 1817 07/13/17 2204  TROPONINI <0.03 <0.03   BNP (last 3 results)  Recent Labs  08/10/16 0850  BNP 79.0    ProBNP (last 3 results) No results for input(s): PROBNP in the last 8760 hours.  CBG:  Recent Labs Lab 07/13/17 2036 07/14/17 0019 07/14/17 0742 07/14/17 1131 07/14/17 1713  GLUCAP 143* 102* 79 136* 148*    No results found for this or any previous visit (from the past 240 hour(s)).   Studies: No results found.  Scheduled Meds: . amLODipine  5 mg Oral Daily  . aspirin EC  81 mg Oral Daily  . atorvastatin  80 mg Oral QPM  . carvedilol  25 mg Oral BID WC  . clopidogrel  75 mg Oral Daily  . doxazosin  4 mg Oral QHS  . ferrous sulfate  325 mg Oral Q breakfast  . heparin  5,000 Units Subcutaneous Q8H  . insulin aspart  0-9 Units Subcutaneous TID WC  . isosorbide mononitrate  30 mg Oral Daily  . LORazepam  0.5 mg Oral Once  . losartan  100 mg Oral Daily  . sodium chloride flush  3 mL Intravenous Q12H    Continuous Infusions:    Time spent: 30mins I have personally reviewed and interpreted on  07/14/2017 daily labs, tele strips,ekg, imagings as discussed above under date review session and assessment and plans.  I reviewed all nursing notes,  vitals, pertinent old records  I have discussed plan of care as described above with RN , patient on 07/14/2017   Lorraine Terriquez MD, PhD  [facility] Pager [phone]. If 7PM-7AM, please contact night-coverage at [email or web address], password [employer] 07/14/2017, 6:28 PM  LOS: 0 days

## 2017-07-15 ENCOUNTER — Other Ambulatory Visit: Payer: Self-pay | Admitting: Cardiology

## 2017-07-15 ENCOUNTER — Observation Stay (HOSPITAL_COMMUNITY): Payer: Medicare Other

## 2017-07-15 ENCOUNTER — Observation Stay (HOSPITAL_BASED_OUTPATIENT_CLINIC_OR_DEPARTMENT_OTHER): Payer: Medicare Other

## 2017-07-15 ENCOUNTER — Encounter (HOSPITAL_COMMUNITY): Payer: Medicare Other

## 2017-07-15 DIAGNOSIS — I10 Essential (primary) hypertension: Secondary | ICD-10-CM | POA: Diagnosis not present

## 2017-07-15 DIAGNOSIS — I340 Nonrheumatic mitral (valve) insufficiency: Secondary | ICD-10-CM | POA: Diagnosis not present

## 2017-07-15 DIAGNOSIS — I361 Nonrheumatic tricuspid (valve) insufficiency: Secondary | ICD-10-CM | POA: Diagnosis not present

## 2017-07-15 DIAGNOSIS — I2510 Atherosclerotic heart disease of native coronary artery without angina pectoris: Secondary | ICD-10-CM | POA: Diagnosis not present

## 2017-07-15 DIAGNOSIS — R55 Syncope and collapse: Secondary | ICD-10-CM | POA: Diagnosis not present

## 2017-07-15 DIAGNOSIS — R079 Chest pain, unspecified: Secondary | ICD-10-CM | POA: Diagnosis not present

## 2017-07-15 LAB — CBC
HEMATOCRIT: 30.6 % — AB (ref 36.0–46.0)
HEMOGLOBIN: 9.3 g/dL — AB (ref 12.0–15.0)
MCH: 22.4 pg — ABNORMAL LOW (ref 26.0–34.0)
MCHC: 30.4 g/dL (ref 30.0–36.0)
MCV: 73.6 fL — ABNORMAL LOW (ref 78.0–100.0)
PLATELETS: 201 10*3/uL (ref 150–400)
RBC: 4.16 MIL/uL (ref 3.87–5.11)
RDW: 14.9 % (ref 11.5–15.5)
WBC: 5.1 10*3/uL (ref 4.0–10.5)

## 2017-07-15 LAB — GLUCOSE, CAPILLARY
GLUCOSE-CAPILLARY: 116 mg/dL — AB (ref 65–99)
GLUCOSE-CAPILLARY: 114 mg/dL — AB (ref 65–99)
GLUCOSE-CAPILLARY: 240 mg/dL — AB (ref 65–99)

## 2017-07-15 LAB — BASIC METABOLIC PANEL
Anion gap: 9 (ref 5–15)
BUN: 28 mg/dL — AB (ref 6–20)
CHLORIDE: 106 mmol/L (ref 101–111)
CO2: 24 mmol/L (ref 22–32)
CREATININE: 1.65 mg/dL — AB (ref 0.44–1.00)
Calcium: 8.5 mg/dL — ABNORMAL LOW (ref 8.9–10.3)
GFR calc Af Amer: 36 mL/min — ABNORMAL LOW (ref >60)
GFR, EST NON AFRICAN AMERICAN: 31 mL/min — AB (ref >60)
Glucose, Bld: 111 mg/dL — ABNORMAL HIGH (ref 65–99)
Potassium: 3.8 mmol/L (ref 3.5–5.1)
SODIUM: 139 mmol/L (ref 135–145)

## 2017-07-15 LAB — MAGNESIUM: MAGNESIUM: 1.7 mg/dL (ref 1.7–2.4)

## 2017-07-15 LAB — ECHOCARDIOGRAM COMPLETE
HEIGHTINCHES: 65 in
WEIGHTICAEL: 4795.2 [oz_av]

## 2017-07-15 LAB — TSH: TSH: 1.092 u[IU]/mL (ref 0.350–4.500)

## 2017-07-15 MED ORDER — FUROSEMIDE 20 MG PO TABS: 20.0000 mg | ORAL_TABLET | ORAL | 0 refills | Status: DC

## 2017-07-15 NOTE — Care Management Note (Signed)
Case Management Note  Patient Details  Name: Alicia [name] MRN: [id number] Date of Birth: 13-Sep-1946  Subjective/Objective:              Presented with fall/syncope.     Action/Plan: Plan is to d/c to home today.  Expected Discharge Date:  07/15/17               Expected Discharge Plan:  [place]  In-House Referral:     Discharge planning Services  CM Consult  Post Acute Care Choice:  Durable Medical Equipment, Home Health Choice offered to:  Patient  DME Arranged:  [facility] D[facility] Agency:  [place] Arranged:  RN [facility] Agency:  [facility]  Status of Service:  Completed, signed off  If discussed at [place] of Stay Meetings, dates discussed:    Additional Comments:  Sharin Mons, RN 07/15/2017, 1:29 PM

## 2017-07-15 NOTE — Evaluation (Signed)
Physical Therapy Evaluation Patient Details Name: Alicia [name] MRN: [id number] DOB: 03/22/1946 Today's Date: 07/15/2017   History of Present Illness   71 y.o. female with a history of DM, CKD, CAD s/p stent x2, and gastric bypass surgery presents to the emergency department with a chief complaint of syncope  Clinical Impression  Orders received for PT evaluation. Patient demonstrates deficits in functional mobility as indicated below. Will benefit from continued skilled PT to address deficits and maximize function. Will see as indicated and progress as tolerated.      Follow Up Recommendations No PT follow up;Supervision for mobility/OOB    Equipment Recommendations  None recommended by PT    Recommendations for Other Services       Precautions / Restrictions Precautions Precautions: Fall      Mobility  Bed Mobility Overal bed mobility: Modified Independent             General bed mobility comments: no physical assist required  Transfers Overall transfer level: Needs assistance   Transfers: Sit to/from Stand Sit to Stand: Min guard         General transfer comment: no physical assist required; min guard for safety  Ambulation/Gait Ambulation/Gait assistance: Min guard Ambulation Distance (Feet): 180 Feet Assistive device: None;1 person hand held assist Gait Pattern/deviations: Step-through pattern;Drifts right/left;Trunk flexed Gait velocity: decreased   General Gait Details: some instability, patient reports not her baseline, inconsistent deviations but able to mobilize without assistive device  Stairs            Wheelchair Mobility    Modified Rankin (Stroke Patients Only) Modified Rankin (Stroke Patients Only) Pre-Morbid Rankin Score: No symptoms Modified Rankin: No significant disability     Balance Overall balance assessment: History of Falls                                           Pertinent Vitals/Pain       Home Living Family/patient expects to be discharged to:: Private residence Living Arrangements: Other relatives Available Help at Discharge: Family Type of Home: House Home Access: Ramped entrance     Home Layout: 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     Hand Dominance   Dominant Hand: Right    Extremity/Trunk Assessment   Upper Extremity Assessment Upper Extremity Assessment: Defer to OT evaluation    Lower Extremity Assessment Lower Extremity Assessment: RLE deficits/detail RLE Deficits / Details: reports RLE weakness, inconsistent upon testing       Communication   Communication: No difficulties  Cognition Arousal/Alertness: Awake/alert Behavior During Therapy: WFL for tasks assessed/performed Overall Cognitive Status: Within Functional Limits for tasks assessed                                        General Comments      Exercises     Assessment/Plan    PT Assessment Patient needs continued PT services  PT Problem List Decreased strength;Decreased balance;Decreased mobility       PT Treatment Interventions DME instruction;Gait training;Functional mobility training;Therapeutic activities;Therapeutic exercise;Stair training;Balance training;Patient/family education    PT Goals (Current goals can be found in  the Care Plan section)  Acute Rehab PT Goals Patient Stated Goal: to get better PT Goal Formulation: With patient Time For Goal Achievement: 07/29/17 Potential to Achieve Goals: Good    Frequency Min 3X/week   Barriers to discharge        Co-evaluation               AM-PAC PT "6 Clicks" Daily Activity  Outcome Measure Difficulty turning over in bed (including adjusting bedclothes, sheets and blankets)?: A Little Difficulty moving from lying on back to sitting on the side of the bed? :  A Little Difficulty sitting down on and standing up from a chair with arms (e.g., wheelchair, bedside commode, etc,.)?: A Little Help needed moving to and from a bed to chair (including a wheelchair)?: A Little Help needed walking in hospital room?: A Little Help needed climbing 3-5 steps with a railing? : A Little 6 Click Score: 18    End of Session Equipment Utilized During Treatment: Gait belt Activity Tolerance: Patient tolerated treatment well Patient left: in chair;with chair alarm set;with call bell/phone within reach Nurse Communication: Mobility status PT Visit Diagnosis: Unsteadiness on feet (R26.81)    Time: 4315-4008 PT Time Calculation (min) (ACUTE ONLY): 19 min   Charges:   PT Evaluation $PT Eval Moderate Complexity: 1 Mod     PT G Codes:   PT G-Codes **NOT FOR INPATIENT CLASS** Functional Assessment Tool Used: Clinical judgement Functional Limitation: Mobility: Walking and moving around Mobility: Walking and Moving Around Current Status (Q7619): At least 1 percent but less than 20 percent impaired, limited or restricted Mobility: Walking and Moving Around Goal Status [phone]): At least 1 percent but less than 20 percent impaired, limited or restricted    Alben Deeds, PT DPT  Board Certified Neurologic Specialist [place] 07/15/2017, 1:34 PM

## 2017-07-15 NOTE — Care Management Obs Status (Signed)
[place] NOTIFICATION   Patient Details  Name: Alicia [name] MRN: [id number] Date of Birth: 1945-12-13   Medicare Observation Status Notification Given:  Yes    Sharin Mons, RN 07/15/2017, 3:09 PM

## 2017-07-15 NOTE — Progress Notes (Signed)
NURSING PROGRESS NOTE  Alicia [name] [id number] Discharge Data: 07/15/2017 3:14 PM Attending Provider: Florencia Reasons, MD GMW:NUUVO, Coralie Keens, NP     [name] to be D/C'd Home per MD order.  Discussed with the patient the After Visit Summary and all questions fully answered. All IV's discontinued with no bleeding noted. All belongings returned to patient for patient to take home.   Last Vital Signs:  Blood pressure (!) 106/52, pulse 71, temperature 97.7 F (36.5 C), temperature source Oral, resp. rate 18, height 5\' 5"  (1.651 m), weight 135.9 kg (299 lb 11.2 oz), SpO2 100 %.  Discharge Medication List Allergies as of 07/15/2017      Reactions   Iodinated Diagnostic Agents Hives   Sulfa Antibiotics Hives      Medication List    TAKE these medications   acetaminophen 325 MG tablet Commonly known as:  TYLENOL Take 2 tablets (650 mg total) by mouth every 4 (four) hours as needed for headache or mild pain.   amLODipine 5 MG tablet Commonly known as:  NORVASC Take 1 tablet (5 mg total) by mouth daily.   aspirin 81 MG tablet Take 81 mg by mouth daily.   atorvastatin 80 MG tablet Commonly known as:  LIPITOR TAKE 1 TABLET BY MOUTH EVERY EVENING What changed:  See the new instructions.   carvedilol 25 MG tablet Commonly known as:  COREG Take 1 tablet (25 mg total) by mouth 2 (two) times daily.   clopidogrel 75 MG tablet Commonly known as:  PLAVIX TAKE 1 TABLET (75 MG TOTAL) BY MOUTH DAILY.   doxazosin 4 MG tablet Commonly known as:  CARDURA TAKE 1 TABLET (4 MG TOTAL) BY MOUTH AT BEDTIME.   ferrous sulfate 325 (65 FE) MG tablet TAKE 1 TABLET (325 MG TOTAL) BY MOUTH 3 (THREE) TIMES DAILY WITH MEALS.   furosemide 20 MG tablet Commonly known as:  LASIX Take 1 tablet (20 mg total) by mouth every Monday, Wednesday, and Friday. What changed:  when to take this   glipiZIDE 10 MG tablet Commonly known as:  GLUCOTROL TAKE 1 TABLET (10 MG TOTAL) BY MOUTH 2 (TWO) TIMES DAILY BEFORE  A MEAL.   isosorbide mononitrate 30 MG 24 hr tablet Commonly known as:  IMDUR Take 1 tablet (30 mg total) by mouth daily.   losartan 100 MG tablet Commonly known as:  COZAAR TAKE 1 TABLET (100 MG TOTAL) BY MOUTH DAILY.   metFORMIN 1000 MG tablet Commonly known as:  GLUCOPHAGE Take 1 tablet (1,000 mg total) by mouth 2 (two) times daily with a meal.   multivitamin with minerals tablet Take 1 tablet by mouth daily.   nitroGLYCERIN 0.4 MG SL tablet Commonly known as:  NITROSTAT Place 1 tablet (0.4 mg total) under the tongue every 5 (five) minutes as needed for chest pain (up to 3 doses).   sitaGLIPtin 100 MG tablet Commonly known as:  JANUVIA Take 1 tablet (100 mg total) by mouth daily.   spironolactone 25 MG tablet Commonly known as:  ALDACTONE TAKE 1 TABLET (25 MG TOTAL) BY MOUTH DAILY.   UNISOM SLEEPGELS PO Take 1 tablet by mouth at bedtime as needed (for sleep).   VICTOZA 18 MG/3ML Sopn Generic drug:  liraglutide INJECT 0.2 MLS (1.2 MG TOTAL) INTO THE SKIN DAILY            Durable Medical Equipment        Start     Ordered   07/15/17 1327  For home use only DME Walker rolling  Once    Question:  Patient needs a walker to treat with the following condition  Answer:  Weakness   07/15/17 1327       Discharge Care Instructions        Start     Ordered   07/15/17 0000  furosemide (LASIX) 20 MG tablet  Every M-W-F     07/15/17 1308   07/15/17 0000  Increase activity slowly     07/15/17 1308   07/15/17 0000  Diet - low sodium heart healthy     07/15/17 1308   07/15/17 0000  Home Health  (Home health needs / face to face )    Question:  To provide the following care/treatments  Answer:  RN   07/15/17 1333   07/15/17 0000  Face-to-face encounter (required for Medicare/Medicaid patients)  (Home health needs / face to face )    Comments:  I Xu,Fang certify that this patient is under my care and that I, or a nurse practitioner or physician's assistant working  with me, had a face-to-face encounter that meets the physician face-to-face encounter requirements with this patient on 07/15/2017. The encounter with the patient was in whole, or in part for the following medical condition(s) which is the primary reason for home health care (List medical condition): FTT  Question Answer Comment  The encounter with the patient was in whole, or in part, for the following medical condition, which is the primary reason for home health care FTT   I certify that, based on my findings, the following services are medically necessary home health services Nursing   Reason for Medically [place]- Change/Decline in Patient Status   My clinical findings support the need for the above services Shortness of breath with activity   Further, I certify that my clinical findings support that this patient is homebound due to: Shortness of Breath with activity      07/15/17 [place], RN

## 2017-07-15 NOTE — Discharge Summary (Signed)
Discharge Summary  Alicia [name] XLK:[id number] DOB: 1946/01/18  PCP: Jearld Fenton, NP  Admit date: 07/13/2017 Discharge date: 07/15/2017  Time spent: >44mins, more than 50% time spent on coordination of care, patient 's counseling.   Recommendations for Outpatient Follow-up:  1. F/u with PMD within a week  for hospital discharge follow up, repeat cbc/bmp at follow up 2. Follow up with cardiology 3. Outpatient holter monitor 4. Home health arranged  Discharge Diagnoses:  Active Hospital Problems   Diagnosis Date Noted  . Syncope 07/13/2017  . Progressive angina ([facility])   . CKD (chronic kidney disease) stage 3, GFR 30-59 ml/min 03/15/2017  . CAD S/P percutaneous coronary angioplasty 10/22/2015  . Essential hypertension 10/22/2015  . Microcytic anemia 10/22/2015  . Morbid obesity ([place]) 10/22/2015  . Hyperlipidemia 10/09/2012  . GERD (gastroesophageal reflux disease) 10/09/2012  . Gastric bypass status for obesity 10/12/2011  . DM (diabetes mellitus), type 2, uncontrolled ([place]) 10/12/2011    Resolved Hospital Problems   Diagnosis Date Noted Date Resolved  No resolved problems to display.    Discharge Condition: stable  Diet recommendation: heart healthy/carb modified  Filed Weights   07/14/17 0500 07/15/17 0500  Weight: 134.9 kg (297 lb 4.8 oz) 135.9 kg (299 lb 11.2 oz)    History of present illness: (per admitting MD) Alicia [name] is a 71 y.o. female who presents with her second syncopal episode in 2 weeks. She has many risk factors for stroke and for MI. She had an episode of chest pain this time and we will admit her to rule out myocardial infarction. Given that this is a second syncopal episode 2 weeks will also check carotid Dopplers and an MRI of the brain. Neurological examination is normal and patient's chest pain was relieved after taking one sublingual nitroglycerin at home.   Hospital Course:  Active Problems:   DM (diabetes mellitus), type 2, uncontrolled  ([place])   Gastric bypass status for obesity   Hyperlipidemia   GERD (gastroesophageal reflux disease)   CAD S/P percutaneous coronary angioplasty   Essential hypertension   Microcytic anemia   Morbid obesity ([facility])   CKD (chronic kidney disease) stage 3, GFR 30-59 ml/min   Progressive angina ([facility])   Syncope  Recurrent Syncope with chest pain, unclear etiology, rule out cardiac versus neurological.  -Doubt infectious source. Afebrile. UA negative. W -EKG SR no apparent ACS, Tn 0.08. Currently chest pain free -Neuro exam US unremarkable, -CT of the head without acute changes.  - . Risk factors for heart disease and stroke include age, strong family history of cardiac and CVA, HTN, HLD, SDM, CAD  --tele  unrevealing, I have messaged cardiology to arrange outpatient holter monitor to r/o arrhythmia -Noncontrast MRI of head no acute findings -2 D echo LVEF wnl, no wma, + grade 1 diastolic dysfunction -she received gentle hydration, she does not have orthostasis, she is feeling better, hold dirutetics held in the hospital, resumed with decreased frequency.   report feeling dizzy upon standing up when she has syncope episode x2 the last few weeks, also report has been loosing weight with cardiopulmonary rehab, Her cr is elevated on admission, improved with gentle hydration, wonder if dehydration is the cause of syncope with recent weight loss She is discharged on reduced lasix 20mg  MWF instead of daily which she was taking prior to hospitalization. she is to have outpatient holter monitor. She also report recently started on januvia for diabetes, which dose seem to have time correlation  of symptom of feeling tired for the last few weeks.   CAD,last cardiac catheterization04/30/2018 with progression of the diagonal lesion, status post stent in 02/2017.  Continue ASA, Plavix high dose statin, beta blockers in am as above  She presented with chest pain, chest pain has resolved,Troponin  negative,  EDP contacted Cardiology  Dr Radford Pax who states not need of cardiology inpatient consult if troponin is negative.   Acute on Chronic kidney disease stage 3-4  Cr 1.9 on admission, cr 1.6 after hydration. D/c ivf , hold home meds diuretics and NSAIDS  Repeat CMET in am ua unremarkable  noninsulin dependentType II Diabetes  recent a1c 7.1 on metformin, victoza at home, she report [place] started a month ago, home meds held, On ssi here, am blood sugar 86,    Hypertension  bp stable, Continue home anti-hypertensive medications (coreg, imdur, cardura, norvasc, losartan), hold lasix /spironolactone   Hyperlipidemia Continue home statins lipid panel  ldl 77, hdl 36  Anemia of chronic disease/ microcytic Hemoglobin on admission 9.5, baseline 10 no bleeding issues noted. MCV 72  No transfusion is indicated at this time Anemia panel  Continue Iron supplements  Morbid obesity remote history of gastric bypass in the 80's Body mass index is 49.09 kg/m. She report recent weight loss with ongoing cardiopulmonary rehab  DVT prophylaxis while in the hospital:Heparin  Code Status:Full  Family Communication:Discussed with patient Disposition Plan: discharged to home after condition improves Consults called:EDP contacted Cardiology  Dr Radford Pax who states not need of cardiology inpatient consult if troponin is negative.   Procedures:  none  Antibiotics:  none   Discharge Exam: BP (!) 104/57 (BP Location: Right Arm)   Pulse 75   Temp 98.1 F (36.7 C) (Oral)   Resp 16   Ht 5\' 5"  (1.651 m)   Wt 135.9 kg (299 lb 11.2 oz)   SpO2 99%   BMI 49.87 kg/m     General:  NAD, obese  Cardiovascular: RRR  Respiratory: CTABL  Abdomen: Soft/ND/NT, positive BS  Musculoskeletal: No Edema  Neuro: alert, oriented    Discharge Instructions You were cared for by a hospitalist during your hospital stay. If you have any questions about  your discharge medications or the care you received while you were in the hospital after you are discharged, you can call the unit and asked to speak with the hospitalist on call if the hospitalist that took care of you is not available. Once you are discharged, your primary care physician will handle any further medical issues. Please note that NO REFILLS for any discharge medications will be authorized once you are discharged, as it is imperative that you return to your primary care physician (or establish a relationship with a primary care physician if you do not have one) for your aftercare needs so that they can reassess your need for medications and monitor your lab values.  Discharge Instructions    Diet - low sodium heart healthy    Complete by:  As directed    Carb modified   Increase activity slowly    Complete by:  As directed      Allergies as of 07/15/2017      Reactions   Iodinated Diagnostic Agents Hives   Sulfa Antibiotics Hives      Medication List    TAKE these medications   acetaminophen 325 MG tablet Commonly known as:  TYLENOL Take 2 tablets (650 mg total) by mouth every 4 (four) hours as needed for headache  or mild pain.   amLODipine 5 MG tablet Commonly known as:  NORVASC Take 1 tablet (5 mg total) by mouth daily.   aspirin 81 MG tablet Take 81 mg by mouth daily.   atorvastatin 80 MG tablet Commonly known as:  LIPITOR TAKE 1 TABLET BY MOUTH EVERY EVENING What changed:  See the new instructions.   carvedilol 25 MG tablet Commonly known as:  COREG Take 1 tablet (25 mg total) by mouth 2 (two) times daily.   clopidogrel 75 MG tablet Commonly known as:  PLAVIX TAKE 1 TABLET (75 MG TOTAL) BY MOUTH DAILY.   doxazosin 4 MG tablet Commonly known as:  CARDURA TAKE 1 TABLET (4 MG TOTAL) BY MOUTH AT BEDTIME.   ferrous sulfate 325 (65 FE) MG tablet TAKE 1 TABLET (325 MG TOTAL) BY MOUTH 3 (THREE) TIMES DAILY WITH MEALS.   furosemide 20 MG tablet Commonly  known as:  LASIX Take 1 tablet (20 mg total) by mouth every Monday, Wednesday, and Friday. What changed:  when to take this   glipiZIDE 10 MG tablet Commonly known as:  GLUCOTROL TAKE 1 TABLET (10 MG TOTAL) BY MOUTH 2 (TWO) TIMES DAILY BEFORE A MEAL.   isosorbide mononitrate 30 MG 24 hr tablet Commonly known as:  IMDUR Take 1 tablet (30 mg total) by mouth daily.   losartan 100 MG tablet Commonly known as:  COZAAR TAKE 1 TABLET (100 MG TOTAL) BY MOUTH DAILY.   metFORMIN 1000 MG tablet Commonly known as:  GLUCOPHAGE Take 1 tablet (1,000 mg total) by mouth 2 (two) times daily with a meal.   multivitamin with minerals tablet Take 1 tablet by mouth daily.   nitroGLYCERIN 0.4 MG SL tablet Commonly known as:  NITROSTAT Place 1 tablet (0.4 mg total) under the tongue every 5 (five) minutes as needed for chest pain (up to 3 doses).   sitaGLIPtin 100 MG tablet Commonly known as:  JANUVIA Take 1 tablet (100 mg total) by mouth daily.   spironolactone 25 MG tablet Commonly known as:  ALDACTONE TAKE 1 TABLET (25 MG TOTAL) BY MOUTH DAILY.   UNISOM SLEEPGELS PO Take 1 tablet by mouth at bedtime as needed (for sleep).   VICTOZA 18 MG/3ML Sopn Generic drug:  liraglutide INJECT 0.2 MLS (1.2 MG TOTAL) INTO THE SKIN DAILY            Discharge Care Instructions        Start     Ordered   07/15/17 0000  furosemide (LASIX) 20 MG tablet  Every M-W-F     07/15/17 1308   07/15/17 0000  Increase activity slowly     07/15/17 1308   07/15/17 0000  Diet - low sodium heart healthy     07/15/17 1308     Allergies  Allergen Reactions  . Iodinated Diagnostic Agents Hives  . Sulfa Antibiotics Hives   Follow-up Information    Jearld Fenton, NP Follow up in 1 week(s).   Specialty:  Internal Medicine Why:  hospital discharge follow up, repeat cbc/bmp Contact information: [place] [address] [phone]        [name], MD Follow up in 2 week(s).     Specialty:  Cardiology Why:  syncope Contact information: [address] [place] [address] [phone]        outpateint heart monitor Follow up.   Why:  please call cardiology office to schedule heart monitor if you do not hear  from them in three days           The results of significant diagnostics from this hospitalization (including imaging, microbiology, ancillary and laboratory) are listed below for reference.    Significant Diagnostic Studies: Dg Chest 2 View  Result Date: 07/13/2017 CLINICAL DATA:  Syncope, chest pressure EXAM: CHEST  2 VIEW COMPARISON:  10/17/2015 FINDINGS: Lungs are clear.  No pleural effusion or pneumothorax. The heart is normal in size. Degenerative changes of the visualized thoracolumbar spine. IMPRESSION: Normal chest radiographs. Electronically Signed   By: Julian Hy M.D.   On: 07/13/2017 13:43   Ct Head Wo Contrast  Result Date: 07/13/2017 CLINICAL DATA:  Recurrent syncope, pain above right eye EXAM: CT HEAD WITHOUT CONTRAST TECHNIQUE: Contiguous axial images were obtained from the base of the skull through the vertex without intravenous contrast. COMPARISON:  None. FINDINGS: Brain: No evidence of acute infarction, hemorrhage, hydrocephalus, extra-axial collection or mass lesion/mass effect. Vascular: Intracranial atherosclerosis. Skull: Normal. Negative for fracture or focal lesion. Sinuses/Orbits: Mild partial opacification of the bilateral ethmoid sinuses. Visualized paranasal sinuses and mastoid air cells are otherwise clear. Other: None. IMPRESSION: Normal head CT. Electronically Signed   By: Julian Hy M.D.   On: 07/13/2017 13:43   Mr Brain Wo Contrast  Result Date: 07/15/2017 CLINICAL DATA:  Syncope EXAM: MRI HEAD WITHOUT CONTRAST TECHNIQUE: Multiplanar, multiecho pulse sequences of the brain and surrounding structures were obtained without intravenous contrast. COMPARISON:  Head CT 07/13/2017 FINDINGS: The  study is degraded by motion, despite efforts to reduce this artifact, including utilization of motion-resistant MR sequences. The findings of the study are interpreted in the context of reduced sensitivity/specificity. Brain: Expanded, partially empty sella. There is no focal diffusion restriction to indicate acute infarct. The brain parenchymal signal is normal and there is no mass lesion. No intraparenchymal hematoma or chronic microhemorrhage. Brain volume is normal for age without lobar predominant atrophy. The dura is normal and there is no extra-axial collection. Vascular: Major intracranial arterial and venous sinus flow voids are preserved. Skull and upper cervical spine: The visualized skull base, calvarium, upper cervical spine and extracranial soft tissues are normal. Sinuses/Orbits: No fluid levels or advanced mucosal thickening. No mastoid or middle ear effusion. Normal orbits. IMPRESSION: No acute abnormality or finding to explain the reported syncopal episodes. Electronically Signed   By: Ulyses Jarred M.D.   On: 07/15/2017 05:07   Dg Foot Complete Left  Result Date: 06/26/2017 CLINICAL DATA:  Pain and swelling EXAM: LEFT FOOT - COMPLETE 3+ VIEW COMPARISON:  None. FINDINGS: Frontal, oblique, and lateral views were obtained. There is no fracture or dislocation. There is slight narrowing of all PIP and DIP joints as well as the first MTP joint. No erosive change. There are small posterior and inferior calcaneal spurs. There is a phlebolith in the dorsal midfoot region. IMPRESSION: Slight narrowing of multiple distal joints. No fracture or dislocation. There are calcaneal spurs. Electronically Signed   By: Lowella Grip III M.D.   On: 06/26/2017 14:41    Microbiology: No results found for this or any previous visit (from the past 240 hour(s)).   Labs: Basic Metabolic Panel:  Recent Labs Lab 07/13/17 1355 07/14/17 0638 07/15/17 0602  NA 139 142 139  K 4.3 3.7 3.8  CL 104 106 106    CO2 24 26 24   GLUCOSE 95 86 111*  BUN 27* 27* 28*  CREATININE 1.90* 1.61* 1.65*  CALCIUM 8.8* 8.8* 8.5*  MG  --   --  1.7   Liver Function Tests:  Recent Labs Lab 07/13/17 1355 07/14/17 0638  AST 21 17  ALT 15 14  ALKPHOS 65 59  BILITOT 0.7 0.7  PROT 6.1* 6.0*  ALBUMIN 3.6 3.3*   No results for input(s): LIPASE, AMYLASE in the last 168 hours. No results for input(s): AMMONIA in the last 168 hours. CBC:  Recent Labs Lab 07/13/17 1355 07/14/17 0638 07/15/17 0602  WBC 6.3 5.1 5.1  HGB 9.5* 9.2* 9.3*  HCT 30.6* 29.9* 30.6*  MCV 72.9* 73.3* 73.6*  PLT 216 223 201   Cardiac Enzymes:  Recent Labs Lab 07/13/17 1817 07/13/17 2204  TROPONINI <0.03 <0.03   BNP: BNP (last 3 results)  Recent Labs  08/10/16 0850  BNP 79.0    ProBNP (last 3 results) No results for input(s): PROBNP in the last 8760 hours.  CBG:  Recent Labs Lab 07/14/17 1131 07/14/17 1713 07/14/17 2304 07/15/17 0852 07/15/17 1214  GLUCAP 136* 148* 116* 114* 240*       Signed:  Maytte Jacot MD, PhD  [facility] 07/15/2017, 1:15 PM

## 2017-07-15 NOTE — Progress Notes (Signed)
  Echocardiogram 2D Echocardiogram has been performed.  Alicia [name] Alicia [name] 07/15/2017, 2:49 PM

## 2017-07-16 ENCOUNTER — Telehealth (HOSPITAL_COMMUNITY): Payer: Self-pay | Admitting: Cardiac Rehabilitation

## 2017-07-16 NOTE — Telephone Encounter (Signed)
pc to pt to assess symptoms since DC home from hospital. Pt awaiting event monitor. Pt also awaiting home health RN evaluation.  Pt instructed to hold cardiac rehab until [facility] evaluates and MD clearance received.  Understanding verbalized.

## 2017-07-17 ENCOUNTER — Encounter (HOSPITAL_COMMUNITY): Admission: RE | Admit: 2017-07-17 | Payer: Medicare Other | Source: Ambulatory Visit

## 2017-07-18 ENCOUNTER — Telehealth: Payer: Self-pay | Admitting: *Deleted

## 2017-07-18 NOTE — Telephone Encounter (Signed)
[facility] nurse left message at Triage. She was going over pt's D/C summary from the hospital and it said she needs to schedule a f/u with Webb Silversmith, NP within a week and pt hasn't schedule appt yet. She request that we call pt and schedule appt., she also wanted [place] to let pt know if she needs to continue the victoza. It says it on her med list but pt said there was no "green check mark" beside med on d/c summary so pt hasn't been taking it.   Also Juliann Pulse said her d/c summary said pt needs to have labs done but no time frame was listed. So she said if we need her to draw pt's labs so we can have them back by pt's f/u app with Webb Silversmith, NP she will do them, she just request that we call her back and let her know what labs to draw.[phone] if not she just request that we call pt directly and schedule her a hospital f/u and advise her about the Victoza  She did say pt seems stable, all of her vitals were normal, the only thing that was a little low was her O2 at 92%

## 2017-07-18 NOTE — Telephone Encounter (Signed)
Call pt to schedule hospital follow up. No labs needed prior to appt

## 2017-07-19 ENCOUNTER — Encounter (HOSPITAL_COMMUNITY): Admission: RE | Admit: 2017-07-19 | Payer: Medicare Other | Source: Ambulatory Visit

## 2017-07-19 NOTE — Telephone Encounter (Signed)
Pt has hosp f/u scheduled for Tues the 4th and advised to restart the Victoza as it was not d/c at the hosp

## 2017-07-23 ENCOUNTER — Encounter: Payer: Self-pay | Admitting: Internal Medicine

## 2017-07-23 ENCOUNTER — Ambulatory Visit (INDEPENDENT_AMBULATORY_CARE_PROVIDER_SITE_OTHER): Payer: Medicare Other | Admitting: Internal Medicine

## 2017-07-23 VITALS — BP 108/78 | HR 82 | Temp 97.7°F | Wt 303.0 lb

## 2017-07-23 DIAGNOSIS — R55 Syncope and collapse: Secondary | ICD-10-CM

## 2017-07-23 DIAGNOSIS — E119 Type 2 diabetes mellitus without complications: Secondary | ICD-10-CM

## 2017-07-23 DIAGNOSIS — E860 Dehydration: Secondary | ICD-10-CM

## 2017-07-23 LAB — COMPREHENSIVE METABOLIC PANEL
ALT: 23 U/L (ref 0–35)
AST: 20 U/L (ref 0–37)
Albumin: 3.8 g/dL (ref 3.5–5.2)
Alkaline Phosphatase: 68 U/L (ref 39–117)
BILIRUBIN TOTAL: 0.4 mg/dL (ref 0.2–1.2)
BUN: 24 mg/dL — ABNORMAL HIGH (ref 6–23)
CO2: 26 meq/L (ref 19–32)
Calcium: 9.2 mg/dL (ref 8.4–10.5)
Chloride: 105 mEq/L (ref 96–112)
Creatinine, Ser: 1.89 mg/dL — ABNORMAL HIGH (ref 0.40–1.20)
GFR: 33.83 mL/min — AB (ref >60.00)
GLUCOSE: 122 mg/dL — AB (ref 70–99)
Potassium: 4.9 mEq/L (ref 3.5–5.1)
SODIUM: 138 meq/L (ref 135–145)
Total Protein: 6.2 g/dL (ref 6.0–8.3)

## 2017-07-23 LAB — CBC
HCT: 31.3 % — ABNORMAL LOW (ref 36.0–46.0)
HEMOGLOBIN: 9.8 g/dL — AB (ref 12.0–15.0)
MCHC: 31.3 g/dL (ref 30.0–36.0)
MCV: 72.7 fl — ABNORMAL LOW (ref 78.0–100.0)
Platelets: 226 10*3/uL (ref 150.0–400.0)
RBC: 4.3 Mil/uL (ref 3.87–5.11)
RDW: 15.6 % — AB (ref 11.5–15.5)
WBC: 5.1 10*3/uL (ref 4.0–10.5)

## 2017-07-23 MED ORDER — SITAGLIPTIN PHOSPHATE 100 MG PO TABS: 100.0000 mg | ORAL_TABLET | Freq: Every day | ORAL | 3 refills | Status: DC

## 2017-07-23 MED ORDER — ONETOUCH ULTRA 2 W/DEVICE KIT: 1.0000 | PACK | Freq: Once | 0 refills | Status: AC

## 2017-07-23 NOTE — Addendum Note (Signed)
Addended by: Lurlean Nanny on: 07/23/2017 05:05 PM   Modules accepted: Orders

## 2017-07-23 NOTE — Patient Instructions (Signed)
Syncope Syncope is when you lose temporarily pass out (faint). Signs that you may be about to pass out include:  Feeling dizzy or light-headed.  Feeling sick to your stomach (nauseous).  Seeing all white or all black.  Having cold, clammy skin.  If you passed out, get help right away. Call your local emergency services (911 in the [place] Do not drive yourself to the hospital. Follow these instructions at home: Pay attention to any changes in your symptoms. Take these actions to help with your condition:  Have someone stay with you until you feel stable.  Do not drive, use machinery, or play sports until your doctor says it is okay.  Keep all follow-up visits as told by your doctor. This is important.  If you start to feel like you might pass out, lie down right away and raise (elevate) your feet above the level of your heart. Breathe deeply and steadily. Wait until all of the symptoms are gone.  Drink enough fluid to keep your pee (urine) clear or pale yellow.  If you are taking blood pressure or heart medicine, get up slowly and spend many minutes getting ready to sit and then stand. This can help with dizziness.  Take over-the-counter and prescription medicines only as told by your doctor.  Get help right away if:  You have a very bad headache.  You have unusual pain in your chest, tummy, or back.  You are bleeding from your mouth or rectum.  You have black or tarry poop (stool).  You have a very fast or uneven heartbeat (palpitations).  It hurts to breathe.  You pass out once or more than once.  You have jerky movements that you cannot control (seizure).  You are confused.  You have trouble walking.  You are very weak.  You have vision problems. These symptoms may be an emergency. Do not wait to see if the symptoms will go away. Get medical help right away. Call your local emergency services (911 in the [place] Do not drive yourself to the hospital. This  information is not intended to replace advice given to you by your health care provider. Make sure you discuss any questions you have with your health care provider. Document Released: 04/23/2008 Document Revised: 04/12/2016 Document Reviewed: 07/20/2015 Elsevier Interactive Patient Education  2018 [employer]  

## 2017-07-23 NOTE — Progress Notes (Signed)
Subjective:    Patient ID: Alicia [name], female    DOB: 09/04/1946, 71 y.o.   MRN: [id number]  HPI  Pt presents to the clinic today for hospital followup. She went to the ER with c/o her 2nd syncopal episodes and chest pain within a 2 week period. ECG was normal. Troponin's were negative. CT head and MRI brain were non revealing. Orthostatics were negative. Labs showed mild dehydration. She was given fluid replacement and admitted for further monitoring. Echo showed grad 1 diastolic dysfucntion. Her Lasix was changed to QMWF instead of daily. All other medications stayed the same. She was set up with cardiology for an outpatient Holter Monitor. Since discharge, she reports she has not had any syncopal episodes. She tends to be intermittently lightheaded, and has noticed this she started the Amlodipine. She feels like her BP is too low. She follows up with cardiology on 9/12 for her Holter Monitor.  She is requesting a refill of the Januvia today. She has run out.  Review of Systems      Past Medical History:  Diagnosis Date  . Anemia   . Arthritis    "right knee" (10/21/2015)  . CAD (coronary artery disease)    a. Cath 10/21/15: s/p DES to RCA: moderate diffuse stenosis of mod diagonal, diffuse irregularity LCx and LAD. 2D echo 10/21/15: mod LVH, EF 60-65%, no RWMA, calcified MV, mod-severe LAE. 03/18/17 PCI with DES--> 1 diag  . Cataract   . Essential hypertension   . Family history of adverse reaction to anesthesia    "oldest sister, [name], related to brain formation at back of head; when they put her to sleep it's hard to wake her up"  . GERD (gastroesophageal reflux disease)   . History of blood transfusion    "related to ruptured tubal pregnancy"  . Hyperlipidemia   . Hypertensive heart disease   . Morbid obesity ([place])   . Type II diabetes mellitus ([place])     Current Outpatient Prescriptions  Medication Sig Dispense Refill  . acetaminophen (TYLENOL) 325 MG tablet Take 2  tablets (650 mg total) by mouth every 4 (four) hours as needed for headache or mild pain.    [name] amLODipine (NORVASC) 5 MG tablet Take 1 tablet (5 mg total) by mouth daily. 90 tablet 3  . aspirin 81 MG tablet Take 81 mg by mouth daily.    [name] atorvastatin (LIPITOR) 80 MG tablet TAKE 1 TABLET BY MOUTH EVERY EVENING (Patient taking differently: TAKE 80 MG BY MOUTH EVERY EVENING) 90 tablet 1  . carvedilol (COREG) 25 MG tablet Take 1 tablet (25 mg total) by mouth 2 (two) times daily. 180 tablet 3  . clopidogrel (PLAVIX) 75 MG tablet TAKE 1 TABLET (75 MG TOTAL) BY MOUTH DAILY. 90 tablet 2  . DiphenhydrAMINE HCl, Sleep, (UNISOM SLEEPGELS PO) Take 1 tablet by mouth at bedtime as needed (for sleep).     [name] doxazosin (CARDURA) 4 MG tablet TAKE 1 TABLET (4 MG TOTAL) BY MOUTH AT BEDTIME. 90 tablet 0  . ferrous sulfate 325 (65 FE) MG tablet TAKE 1 TABLET (325 MG TOTAL) BY MOUTH 3 (THREE) TIMES DAILY WITH MEALS. 90 tablet 3  . furosemide (LASIX) 20 MG tablet Take 1 tablet (20 mg total) by mouth every Monday, Wednesday, and Friday. 30 tablet 0  . glipiZIDE (GLUCOTROL) 10 MG tablet TAKE 1 TABLET (10 MG TOTAL) BY MOUTH 2 (TWO) TIMES DAILY BEFORE A MEAL. 180 tablet 0  . isosorbide mononitrate (IMDUR)  30 MG 24 hr tablet Take 1 tablet (30 mg total) by mouth daily. 90 tablet 3  . losartan (COZAAR) 100 MG tablet TAKE 1 TABLET (100 MG TOTAL) BY MOUTH DAILY. 90 tablet 0  . metFORMIN (GLUCOPHAGE) 1000 MG tablet Take 1 tablet (1,000 mg total) by mouth 2 (two) times daily with a meal. 180 tablet 1  . Multiple Vitamins-Minerals (MULTIVITAMIN WITH MINERALS) tablet Take 1 tablet by mouth daily.    . nitroGLYCERIN (NITROSTAT) 0.4 MG SL tablet Place 1 tablet (0.4 mg total) under the tongue every 5 (five) minutes as needed for chest pain (up to 3 doses). 25 tablet 3  . sitaGLIPtin (JANUVIA) 100 MG tablet Take 1 tablet (100 mg total) by mouth daily. 30 tablet 3  . spironolactone (ALDACTONE) 25 MG tablet TAKE 1 TABLET (25 MG TOTAL) BY  MOUTH DAILY. 90 tablet 0  . VICTOZA 18 MG/3ML SOPN INJECT 0.2 MLS (1.2 MG TOTAL) INTO THE SKIN DAILY 6 pen 1   No current facility-administered medications for this visit.     Allergies  Allergen Reactions  . Iodinated Diagnostic Agents Hives  . Sulfa Antibiotics Hives    Family History  Problem Relation Age of Onset  . Diabetes Mother   . Heart attack Mother        2007  . Hypertension Mother   . Hyperlipidemia Mother   . Heart disease Mother   . Heart attack Father   . Stroke Father   . Hypertension Father   . Hyperlipidemia Father   . Stomach cancer Sister   . Diabetes Sister   . Diabetes Sister   . Diabetes Sister     Social History   Social History  . Marital status: Divorced    Spouse name: N/A  . Number of children: 3  . Years of education: doctorate   Occupational History  . Teacher    Social History Main Topics  . Smoking status: Never Smoker  . Smokeless tobacco: Never Used  . Alcohol use No  . Drug use: No  . Sexual activity: Not Currently   Other Topics Concern  . Not on file   Social History Narrative   Caffeine use-yes   Regular exercise-no     Constitutional: Denies fever, malaise, fatigue, headache or abrupt weight changes.  Respiratory: Denies difficulty breathing, shortness of breath, cough or sputum production.   Cardiovascular: Denies chest pain, chest tightness, palpitations or swelling in the hands or feet.  Neurological: Denies dizziness, difficulty with memory, difficulty with speech or problems with balance and coordination.    No other specific complaints in a complete review of systems (except as listed in HPI above).  Objective:   Physical Exam   BP 108/78   Pulse 82   Temp 97.7 F (36.5 C) (Oral)   Wt (!) 303 lb (137.4 kg)   SpO2 98%   BMI 50.42 kg/m  Wt Readings from Last 3 Encounters:  07/23/17 (!) 303 lb (137.4 kg)  07/15/17 299 lb 11.2 oz (135.9 kg)  06/26/17 (!) 302 lb 12.8 oz (137.3 kg)    General:  Appears her stated age, obese in NAD. Cardiovascular: Normal rate and rhythm. S1,S2 noted.  No murmur, rubs or gallops noted. Trace BLE edema.  Pulmonary/Chest: Normal effort and positive vesicular breath sounds. No respiratory distress. No wheezes, rales or ronchi noted.  Neurological: Alert and oriented.  BMET    Component Value Date/Time   NA 139 07/15/2017 0602   NA 142 04/08/2017 1353  K 3.8 07/15/2017 0602   CL 106 07/15/2017 0602   CO2 24 07/15/2017 0602   GLUCOSE 111 (H) 07/15/2017 0602   BUN 28 (H) 07/15/2017 0602   BUN 19 04/08/2017 1353   CREATININE 1.65 (H) 07/15/2017 0602   CREATININE 1.55 (H) 08/10/2016 0850   CALCIUM 8.5 (L) 07/15/2017 0602   GFRNONAA 31 (L) 07/15/2017 0602   GFRAA 36 (L) 07/15/2017 0602    Lipid Panel     Component Value Date/Time   CHOL 124 07/14/2017 0638   TRIG 56 07/14/2017 0638   HDL 36 (L) 07/14/2017 0638   CHOLHDL 3.4 07/14/2017 0638   VLDL 11 07/14/2017 0638   LDLCALC 77 07/14/2017 0638    CBC    Component Value Date/Time   WBC 5.1 07/15/2017 0602   RBC 4.16 07/15/2017 0602   HGB 9.3 (L) 07/15/2017 0602   HGB 10.6 (L) 03/15/2017 1341   HCT 30.6 (L) 07/15/2017 0602   HCT 34.7 03/15/2017 1341   PLT 201 07/15/2017 0602   PLT 208 03/15/2017 1341   MCV 73.6 (L) 07/15/2017 0602   MCV 72 (L) 03/15/2017 1341   MCH 22.4 (L) 07/15/2017 0602   MCHC 30.4 07/15/2017 0602   RDW 14.9 07/15/2017 0602   RDW 16.2 (H) 03/15/2017 1341   LYMPHSABS 1.5 11/16/2016 1212   MONOABS 0.3 11/16/2016 1212   EOSABS 0.1 11/16/2016 1212   BASOSABS 0.0 11/16/2016 1212    Hgb A1C Lab Results  Component Value Date   HGBA1C 8.1 (H) 06/06/2017           Assessment & Plan:   Hospital Follow up for Syncopal Episodes, Dehydration:  Hospital notes, labs and imaging reviewed. Repeat CBC and CMET today D/c Norvasc Januvia refilled today Follow up with cardiology 9/12  RTC in 2 months for your Medicare Wellness Exam Webb Silversmith, NP

## 2017-07-24 ENCOUNTER — Encounter (HOSPITAL_COMMUNITY): Payer: Medicare Other

## 2017-07-26 ENCOUNTER — Other Ambulatory Visit: Payer: Self-pay | Admitting: Internal Medicine

## 2017-07-26 ENCOUNTER — Encounter (HOSPITAL_COMMUNITY): Payer: Medicare Other

## 2017-07-26 DIAGNOSIS — R944 Abnormal results of kidney function studies: Secondary | ICD-10-CM

## 2017-07-26 DIAGNOSIS — E1122 Type 2 diabetes mellitus with diabetic chronic kidney disease: Secondary | ICD-10-CM

## 2017-07-26 DIAGNOSIS — R7989 Other specified abnormal findings of blood chemistry: Secondary | ICD-10-CM

## 2017-07-29 ENCOUNTER — Encounter (HOSPITAL_COMMUNITY): Admission: RE | Admit: 2017-07-29 | Payer: Medicare Other | Source: Ambulatory Visit

## 2017-07-30 NOTE — Addendum Note (Signed)
Encounter addended by: Dorna Bloom D on: 07/30/2017  3:33 PM<BR>    Actions taken: Flowsheet accepted, Flowsheet data copied forward, Visit Navigator Flowsheet section accepted

## 2017-07-31 ENCOUNTER — Encounter (HOSPITAL_COMMUNITY): Payer: Medicare Other

## 2017-07-31 ENCOUNTER — Ambulatory Visit (INDEPENDENT_AMBULATORY_CARE_PROVIDER_SITE_OTHER): Payer: Medicare Other

## 2017-07-31 DIAGNOSIS — R55 Syncope and collapse: Secondary | ICD-10-CM

## 2017-07-31 MED ORDER — GLUCOSE BLOOD VI STRP: 1.0000 | ORAL_STRIP | Freq: Two times a day (BID) | 11 refills | Status: DC

## 2017-07-31 NOTE — Addendum Note (Signed)
Addended by: Lurlean Nanny on: 07/31/2017 10:14 AM   Modules accepted: Orders

## 2017-08-06 ENCOUNTER — Encounter: Payer: Self-pay | Admitting: Cardiology

## 2017-08-06 ENCOUNTER — Ambulatory Visit (INDEPENDENT_AMBULATORY_CARE_PROVIDER_SITE_OTHER): Payer: Medicare Other | Admitting: Cardiology

## 2017-08-06 VITALS — BP 120/70 | HR 80 | Ht 65.0 in | Wt 305.1 lb

## 2017-08-06 DIAGNOSIS — R55 Syncope and collapse: Secondary | ICD-10-CM | POA: Diagnosis not present

## 2017-08-06 NOTE — Patient Instructions (Signed)
Medication Instructions: Your physician recommends that you continue on your current medications as directed. Please refer to the Current Medication list given to you today.  Labwork: None Ordered  Procedures/Testing: None Ordered  Follow-Up: Your physician wants you to follow-up in January 2019 with Dr. Harrington Challenger.  Any Additional Special Instructions Will Be Listed Below (If Applicable).  -- Please continue to monitor your swelling and weight gain. Call the office and report if you have gained more then 3 POUNDS in a DAY or 5 POUNDS in a WEEK.  If you need a refill on your cardiac medications before your next appointment, please call your pharmacy.

## 2017-08-06 NOTE — Progress Notes (Signed)
08/06/2017 Alicia [name]   [birth year]  [id number]  Primary Physician Garnette Gunner Coralie Keens, NP Primary Cardiologist: Dr. Harrington Challenger  Reason for Visit/CC: [facility] f/u for Syncope  HPI:   Pt is a 71 y/o female, followed by Dr. Harrington Challenger, with a hx of CAD s/p RCA DES in Dec 2016, HTN, HL, and diabetes. She was admitted in April 2018 for recurrent CP concerning for cardiac etiology (exertional chest pressure). On 03/18/17 she was taken to the cath lab by Dr Ellyn Hack. Cath revealed a proximal 90% Dx1 lesion which had progressed from prio cath. This was treated with a DES. She also had a 45% mid LAD lesion that was treated with POBA. The prior RCA stent was patent. EF was normal.  She was recently admitted again in August by IM for syncope. Neurologic w/u and imaging was unremarkable including head CT/MRI and carotid dopplers. She was not orthostatic. Negative UA. Troponin's were negative. 2D echo 07/15/17 showed normal LVEF at 65-70%. No WMA. No pericardial effusion nor significant valve anomalities. She was instructed to f/u in our office for 30 day monitor. This was placed last week. Pt presents back to clinic today meet with provider.  She reports that she has done well. No recurrent syncope/ near syncope. Pt thinks she may have been a bit dehydrated when she had her episode a few weeks ago. She denies CP, dyspnea and palpitations. She has been compliant with her monitor. She is wearing it today. BP and HR are stable/ well controlled.   Current Meds  Medication Sig  . acetaminophen (TYLENOL) 325 MG tablet Take 2 tablets (650 mg total) by mouth every 4 (four) hours as needed for headache or mild pain.  [name] aspirin 81 MG tablet Take 81 mg by mouth daily.  [name] atorvastatin (LIPITOR) 80 MG tablet TAKE 1 TABLET BY MOUTH EVERY EVENING (Patient taking differently: TAKE 80 MG BY MOUTH EVERY EVENING)  . carvedilol (COREG) 25 MG tablet Take 1 tablet (25 mg total) by mouth 2 (two) times daily.  . clopidogrel (PLAVIX) 75 MG  tablet TAKE 1 TABLET (75 MG TOTAL) BY MOUTH DAILY.  . DiphenhydrAMINE HCl, Sleep, (UNISOM SLEEPGELS PO) Take 1 tablet by mouth at bedtime as needed (for sleep).   [name] doxazosin (CARDURA) 4 MG tablet TAKE 1 TABLET (4 MG TOTAL) BY MOUTH AT BEDTIME.  . ferrous sulfate 325 (65 FE) MG tablet TAKE 1 TABLET (325 MG TOTAL) BY MOUTH 3 (THREE) TIMES DAILY WITH MEALS.  [name] glipiZIDE (GLUCOTROL) 10 MG tablet TAKE 1 TABLET (10 MG TOTAL) BY MOUTH 2 (TWO) TIMES DAILY BEFORE A MEAL.  [name] glucose blood (ONE TOUCH ULTRA TEST) test strip 1 each by Other route 2 (two) times daily.  . isosorbide mononitrate (IMDUR) 30 MG 24 hr tablet Take 1 tablet (30 mg total) by mouth daily.  [name] losartan (COZAAR) 100 MG tablet TAKE 1 TABLET (100 MG TOTAL) BY MOUTH DAILY.  . metFORMIN (GLUCOPHAGE) 1000 MG tablet Take 1 tablet (1,000 mg total) by mouth 2 (two) times daily with a meal.  . Multiple Vitamins-Minerals (MULTIVITAMIN WITH MINERALS) tablet Take 1 tablet by mouth daily.  . nitroGLYCERIN (NITROSTAT) 0.4 MG SL tablet Place 1 tablet (0.4 mg total) under the tongue every 5 (five) minutes as needed for chest pain (up to 3 doses).  . sitaGLIPtin (JANUVIA) 100 MG tablet Take 1 tablet (100 mg total) by mouth daily.  [name] spironolactone (ALDACTONE) 25 MG tablet TAKE 1 TABLET (25 MG TOTAL) BY MOUTH DAILY.  [name]  VICTOZA 18 MG/3ML SOPN INJECT 0.2 MLS (1.2 MG TOTAL) INTO THE SKIN DAILY   Allergies  Allergen Reactions  . Iodinated Diagnostic Agents Hives  . Sulfa Antibiotics Hives   Past Medical History:  Diagnosis Date  . Anemia   . Arthritis    "right knee" (10/21/2015)  . CAD (coronary artery disease)    a. Cath 10/21/15: s/p DES to RCA: moderate diffuse stenosis of mod diagonal, diffuse irregularity LCx and LAD. 2D echo 10/21/15: mod LVH, EF 60-65%, no RWMA, calcified MV, mod-severe LAE. 03/18/17 PCI with DES--> 1 diag  . Cataract   . Essential hypertension   . Family history of adverse reaction to anesthesia    "oldest sister, [name],  related to brain formation at back of head; when they put her to sleep it's hard to wake her up"  . GERD (gastroesophageal reflux disease)   . History of blood transfusion    "related to ruptured tubal pregnancy"  . Hyperlipidemia   . Hypertensive heart disease   . Morbid obesity ([place])   . Type II diabetes mellitus ([facility])    Family History  Problem Relation Age of Onset  . Diabetes Mother   . Heart attack Mother        2007  . Hypertension Mother   . Hyperlipidemia Mother   . Heart disease Mother   . Heart attack Father   . Stroke Father   . Hypertension Father   . Hyperlipidemia Father   . Stomach cancer Sister   . Diabetes Sister   . Diabetes Sister   . Diabetes Sister    Past Surgical History:  Procedure Laterality Date  . ABDOMINAL HYSTERECTOMY  1988  . APPENDECTOMY  1985  . [place] SURGERY  1984  . CARDIAC CATHETERIZATION N/A 10/21/2015   Procedure: Right/Left Heart Cath and Coronary Angiography;  Surgeon: [name], MD;  Location: [facility];  Service: Cardiovascular;  Laterality: N/A;  . CHOLECYSTECTOMY OPEN  1984  . CORONARY STENT INTERVENTION N/A 03/18/2017   Procedure: Coronary Stent Intervention;  Surgeon: [name], MD;  Location: [facility];  Service: Cardiovascular;  Laterality: N/A;  . CORONARY STENT PLACEMENT  03/18/2017   A OPTIMIZE STUDY Drug Eluting Stent (2.5 mm 18 mm - post-dilated to 2.7 mm) was successfully placed  . ECTOPIC PREGNANCY SURGERY    . LEFT HEART CATH AND CORONARY ANGIOGRAPHY N/A 03/18/2017   Procedure: Left Heart Cath and Coronary Angiography;  Surgeon: [name], MD;  Location: [facility];  Service: Cardiovascular;  Laterality: N/A;  . REDUCTION MAMMAPLASTY Bilateral ~ 1976  . TONSILLECTOMY     Social History   Social History  . Marital status: Divorced    Spouse name: N/A  . Number of children: 3  . Years of education: doctorate   Occupational History  . Teacher    Social History Main  Topics  . Smoking status: Never Smoker  . Smokeless tobacco: Never Used  . Alcohol use No  . Drug use: No  . Sexual activity: Not Currently   Other Topics Concern  . Not on file   Social History Narrative   Caffeine use-yes   Regular exercise-no     Review of Systems: General: negative for chills, fever, night sweats or weight changes.  Cardiovascular: negative for chest pain, dyspnea on exertion, edema, orthopnea, palpitations, paroxysmal nocturnal dyspnea or shortness of breath Dermatological: negative for rash Respiratory: negative for cough or wheezing Urologic: negative for hematuria Abdominal:  negative for nausea, vomiting, diarrhea, bright red blood per rectum, melena, or hematemesis Neurologic: negative for visual changes, syncope, or dizziness All other systems reviewed and are otherwise negative except as noted above.   Physical Exam:  Blood pressure 120/70, pulse 80, height 5\' 5"  (1.651 m), weight (!) 305 lb 1.9 oz (138.4 kg), SpO2 98 %.  General appearance: alert, cooperative and no distress Neck: no carotid bruit and no JVD Lungs: clear to auscultation bilaterally Heart: regular rate and rhythm, S1, S2 normal, no murmur, click, rub or gallop Extremities: extremities normal, atraumatic, no cyanosis or edema Pulses: 2+ and symmetric Skin: Skin color, texture, turgor normal. No rashes or lesions Neurologic: Grossly normal  EKG not performed -- personally reviewed   ASSESSMENT AND PLAN:   1. Syncope: recent echo showed normal LVEF and wall motion. No significant valve abnormalities, troponin negative, neurologic w/u including head Ct/MRI and carotid dopplers were normal. Continue to wear 30 day heart monitor to r/o arrhthymias. Pt encouraged to stay well hydrated with fluids.   2. CAD: h/o PCI as outlined above. Stable w/o anginal symptoms. Continue medical therapy.   3. HTN: controlled on current regimen.   4. DM: followed by PCP.   5. HLD: on statin therapy  w/ Lipitor.    Follow-Up w/ Dr. Harrington Challenger in 3-6 months. If her cardiac monitor is revealing of any concerning arrhythmias, we will have her f/u sooner. We will call pt with results.   Alicia [name], MHS [facility] 08/06/2017 9:27 AM

## 2017-08-11 ENCOUNTER — Other Ambulatory Visit: Payer: Self-pay | Admitting: Physician Assistant

## 2017-08-20 NOTE — Addendum Note (Signed)
Encounter addended by: Lowell Guitar, RN on: 08/20/2017 11:07 AM<BR>    Actions taken: Flowsheet data copied forward, Visit Navigator Flowsheet section accepted

## 2017-08-26 ENCOUNTER — Other Ambulatory Visit: Payer: Self-pay | Admitting: Internal Medicine

## 2017-08-28 NOTE — Addendum Note (Signed)
Encounter addended by: Dorna Bloom D on: 08/28/2017  3:56 PM<BR>    Actions taken: Visit Navigator Flowsheet section accepted

## 2017-08-29 ENCOUNTER — Other Ambulatory Visit: Payer: Self-pay | Admitting: Internal Medicine

## 2017-08-29 DIAGNOSIS — E1165 Type 2 diabetes mellitus with hyperglycemia: Secondary | ICD-10-CM

## 2017-08-29 DIAGNOSIS — I10 Essential (primary) hypertension: Secondary | ICD-10-CM

## 2017-08-30 NOTE — Addendum Note (Signed)
Encounter addended by: Jewel Baize, RD on: 08/30/2017 11:57 AM<BR>    Actions taken: Flowsheet data copied forward, Visit Navigator Flowsheet section accepted

## 2017-09-06 ENCOUNTER — Other Ambulatory Visit: Payer: Self-pay | Admitting: Internal Medicine

## 2017-09-06 DIAGNOSIS — E119 Type 2 diabetes mellitus without complications: Secondary | ICD-10-CM

## 2017-09-07 ENCOUNTER — Other Ambulatory Visit: Payer: Self-pay | Admitting: Internal Medicine

## 2017-09-07 DIAGNOSIS — I10 Essential (primary) hypertension: Secondary | ICD-10-CM

## 2017-09-09 ENCOUNTER — Other Ambulatory Visit (INDEPENDENT_AMBULATORY_CARE_PROVIDER_SITE_OTHER): Payer: Medicare Other

## 2017-09-09 DIAGNOSIS — E1165 Type 2 diabetes mellitus with hyperglycemia: Secondary | ICD-10-CM

## 2017-09-09 LAB — HEMOGLOBIN A1C: HEMOGLOBIN A1C: 6.7 % — AB (ref 4.6–6.5)

## 2017-09-10 ENCOUNTER — Encounter: Payer: Self-pay | Admitting: *Deleted

## 2017-09-10 NOTE — Progress Notes (Signed)
Discharge Progress Report  Patient Details  Name: Alicia [name] MRN: [id number] Date of Birth: 1946-04-16 Referring Provider:     CARDIAC REHAB PHASE II ORIENTATION from 04/23/2017 in [place]  Referring Provider  [name], MD.       Number of Visits: 28  Reason for Discharge:  Early Exit:  Personal-home health physical therapy for generalized weakness.   Smoking History:  History  Smoking Status  . Never Smoker  Smokeless Tobacco  . Never Used    Diagnosis:  03/18/17 DES D1  ADL [facility]:   Initial Exercise Prescription:     Initial Exercise Prescription - 04/23/17 1100      Date of Initial Exercise RX and Referring Provider   Date 04/23/17   Referring Provider [name], MD.     Recumbant Bike   Level 1   Watts 15   Minutes 15   METs 2.15     NuStep   Level 2   SPM 85   Minutes 15   METs 2.2     Track   Laps 10   Minutes 15   METs 2.16     Prescription Details   Frequency (times per week) 3   Duration Progress to 30 minutes of continuous aerobic without signs/symptoms of physical distress     Intensity   THRR 40-80% of Max Heartrate 60-121   Ratings of Perceived Exertion 11-13   Perceived Dyspnea 0-4     Progression   Progression Continue to progress workloads to maintain intensity without signs/symptoms of physical distress.     Resistance Training   Training Prescription Yes   Weight 2lb wts.   Reps 10-15      Discharge Exercise Prescription (Final Exercise Prescription Changes):     Exercise Prescription Changes - 07/12/17 1529      Response to Exercise   Blood Pressure (Admit) 122/70   Blood Pressure (Exercise) 154/84   Blood Pressure (Exit) 112/70   Heart Rate (Admit) 90 bpm   Heart Rate (Exercise) 135 bpm   Heart Rate (Exit) 96 bpm   Rating of Perceived Exertion (Exercise) 12   Symptoms none   Duration Continue with 30 min of aerobic exercise without signs/symptoms of physical distress.   Intensity THRR unchanged     Progression   Progression Continue to progress workloads to maintain intensity without signs/symptoms of physical distress.   Average METs 2.1     Resistance Training   Training Prescription Yes   Weight 2lbs   Reps 10-15   Time 10 Minutes     Recumbant Bike   Level 2.5   Minutes 15   METs 2.1     Track   Laps 10   Minutes 15   METs 2.16     Home Exercise Plan   Plans to continue exercise at Home (comment)  recumbent bike   Frequency Add 2 additional days to program exercise sessions.   Initial Home Exercises Provided 05/15/17      Functional Capacity:     6 Minute Walk    Row Name 04/23/17 1114         6 Minute Walk   Phase Initial     Distance 878 feet     Walk Time 6 minutes     # of Rest Breaks 2     MPH 1.66     METS 1.45     RPE 15     VO2 Peak 5.07  Symptoms No     Resting HR 86 bpm     Resting BP 116/78     Max Ex. HR 125 bpm     Max Ex. BP 164/80     2 Minute Post BP 128/80        Psychological, QOL, Others - Outcomes: PHQ 2/9: Depression screen [facility] 2/9 04/30/2017 10/16/2016 03/16/2016 11/23/2015 10/10/2015  Decreased Interest 0 0 0 0 0  Down, Depressed, Hopeless 1 0 0 0 0  PHQ - 2 Score 1 0 0 0 0    Quality of Life:     Quality of Life - 05/06/17 1617      Quality of Life Scores   Health/Function Pre 18.11 %  pt with multiple health related concerns. pt with decreased strength/stamina experiences dyspnea with climbing stairs or walking in hot temperatures.  pt reports her stamina is improving she is now able to sweep and mop however has difficulty dusting.     Socioeconomic Pre 26.64 %   Psych/Spiritual Pre 28 %   Family Pre 22.5 %   GLOBAL Pre 22.69 %  pt given emotional support and reassurance.        Personal Goals: Goals established at orientation with interventions provided to work toward goal.     Personal Goals and Risk Factors at Admission - 04/23/17 1011      Core Components/Risk  Factors/Patient Goals on Admission    Weight Management Yes;Obesity;Weight Maintenance;Weight Loss   Intervention Weight Management: Develop a combined nutrition and exercise program designed to reach desired caloric intake, while maintaining appropriate intake of nutrient and fiber, sodium and fats, and appropriate energy expenditure required for the weight goal.;Weight Management: Provide education and appropriate resources to help participant work on and attain dietary goals.;Obesity: Provide education and appropriate resources to help participant work on and attain dietary goals.;Weight Management/Obesity: Establish reasonable short term and long term weight goals.   Admit Weight 308 lb 10.3 oz (140 kg)   Goal Weight: Short Term 305 lb 5.4 oz (138.5 kg)   Goal Weight: Long Term 300 lb (136.1 kg)   Expected Outcomes Short Term: Continue to assess and modify interventions until short term weight is achieved;Weight Maintenance: Understanding of the daily nutrition guidelines, which includes 25-35% calories from fat, 7% or less cal from saturated fats, less than 200mg  cholesterol, less than 1.5gm of sodium, & 5 or more servings of fruits and vegetables daily;Long Term: Adherence to nutrition and physical activity/exercise program aimed toward attainment of established weight goal;Weight Loss: Understanding of general recommendations for a balanced deficit meal plan, which promotes 1-2 lb weight loss per week and includes a negative energy balance of [phone] kcal/d;Understanding recommendations for meals to include 15-35% energy as protein, 25-35% energy from fat, 35-60% energy from carbohydrates, less than 200mg  of dietary cholesterol, 20-35 gm of total fiber daily;Understanding of distribution of calorie intake throughout the day with the consumption of 4-5 meals/snacks   Diabetes Yes   Intervention Provide education about signs/symptoms and action to take for hypo/hyperglycemia.;Provide education about  proper nutrition, including hydration, and aerobic/resistive exercise prescription along with prescribed medications to achieve blood glucose in normal ranges: Fasting glucose 65-99 mg/dL   Expected Outcomes Short Term: Participant verbalizes understanding of the signs/symptoms and immediate care of hyper/hypoglycemia, proper foot care and importance of medication, aerobic/resistive exercise and nutrition plan for blood glucose control.   Hypertension Yes   Intervention Provide education on lifestyle modifcations including regular physical activity/exercise, weight management, moderate sodium restriction  and increased consumption of fresh fruit, vegetables, and low fat dairy, alcohol moderation, and smoking cessation.;Monitor prescription use compliance.   Expected Outcomes Short Term: Continued assessment and intervention until BP is < 140/54mm HG in hypertensive participants. < 130/59mm HG in hypertensive participants with diabetes, heart failure or chronic kidney disease.;Long Term: Maintenance of blood pressure at goal levels.   Stress Yes   Intervention Offer individual and/or small group education and counseling on adjustment to heart disease, stress management and health-related lifestyle change. Teach and support self-help strategies.;Refer participants experiencing significant psychosocial distress to appropriate mental health specialists for further evaluation and treatment. When possible, include family members and significant others in education/counseling sessions.   Expected Outcomes Short Term: Participant demonstrates changes in health-related behavior, relaxation and other stress management skills, ability to obtain effective social support, and compliance with psychotropic medications if prescribed.;Long Term: Emotional wellbeing is indicated by absence of clinically significant psychosocial distress or social isolation.       Personal Goals Discharge:     Goals and Risk Factor Review     Row Name 05/07/17 1650 06/03/17 1632 07/04/17 1053 08/20/17 1105       Core Components/Risk Factors/Patient Goals Review   Personal Goals Review Weight Management/Obesity;Diabetes;Hypertension;Stress Weight Management/Obesity;Diabetes;Hypertension;Stress Weight Management/Obesity;Diabetes;Hypertension;Stress Weight Management/Obesity;Diabetes;Hypertension;Stress    Review pt with multiple risk factors for CAD displays eagerness to participate in CR group exercise program. pt with multiple risk factors for CAD displays eagerness to participate in CR group exercise program.  pt is pleased with her increased ability to perform household activities. pt is especially excited to spend this week with her 1yo granddaughter.  pt with multiple risk factors for CAD displays eagerness to participate in CR group exercise program.  pt is pleased with her increased ability to perform household activities. pt demonstrates commitment to participate in CR group exercise classes. .  pt exited program early due to medical hospitalization with home health RN/PT upon discharge.  pt interested in resuming CR when cleared medically.      Expected Outcomes pt will participate in CR exercise, nutrition, and lifestyle education classes to decrease overall risk factors.  pt will participate in CR exercise, nutrition, and lifestyle education classes to decrease overall risk factors.  pt will participate in CR exercise, nutrition, and lifestyle education classes to decrease overall risk factors.  pt will continue physical activity, nutrition and lifestyle modifications to decrease overall risk factors.        Exercise Goals and Review:     Exercise Goals    Row Name 04/23/17 1116             Exercise Goals   Increase Physical Activity Yes       Intervention Provide advice, education, support and counseling about physical activity/exercise needs.;Develop an individualized exercise prescription for aerobic and resistive  training based on initial evaluation findings, risk stratification, comorbidities and participant's personal goals.       Expected Outcomes Achievement of increased cardiorespiratory fitness and enhanced flexibility, muscular endurance and strength shown through measurements of functional capacity and personal statement of participant.       Increase Strength and Stamina Yes       Intervention Provide advice, education, support and counseling about physical activity/exercise needs.;Develop an individualized exercise prescription for aerobic and resistive training based on initial evaluation findings, risk stratification, comorbidities and participant's personal goals.       Expected Outcomes Achievement of increased cardiorespiratory fitness and enhanced flexibility, muscular endurance and strength shown through  measurements of functional capacity and personal statement of participant.          Nutrition & Weight - Outcomes:     Pre Biometrics - 04/23/17 1009      Pre Biometrics   Height 5' 5.25" (1.657 m)   Waist Circumference 49 inches   Hip Circumference 62 inches   Waist to Hip Ratio 0.79 %   BMI (Calculated) 51.1   Triceps Skinfold 67 mm   % Body Fat 61.2 %   Grip Strength 26 kg   Flexibility 10 in   Single Leg Stand 7.03 seconds       Nutrition:     Nutrition Therapy & Goals - 06/24/17 1448      Nutrition Therapy   Diet Carb Modified, Therapeutic Lifestyle Changes     Personal Nutrition Goals   Nutrition Goal Wt loss of 1-2 lb/week to a wt loss goal of 6-24 lb at graduation from [place] Goal #2 Improved glycemic control as evidenced by A1c trending toward 7.0 or less.      Intervention Plan   Intervention Prescribe, educate and counsel regarding individualized specific dietary modifications aiming towards targeted core components such as weight, hypertension, lipid management, diabetes, heart failure and other comorbidities.   Expected Outcomes Short  Term Goal: Understand basic principles of dietary content, such as calories, fat, sodium, cholesterol and nutrients.;Long Term Goal: Adherence to prescribed nutrition plan.      Nutrition Discharge:     Nutrition Assessments - 06/24/17 1448      MEDFICTS Scores   Pre Score 38      Education Questionnaire Score:     Knowledge Questionnaire Score - 04/23/17 1117      Knowledge Questionnaire Score   Pre Score 23/24      Goals reviewed with patient; copy given to patient.

## 2017-09-10 NOTE — Addendum Note (Signed)
Encounter addended by: Lowell Guitar, RN on: 09/10/2017  7:22 AM<BR>    Actions taken: Visit Navigator Flowsheet section accepted, Sign clinical note, Episode resolved

## 2017-09-11 ENCOUNTER — Other Ambulatory Visit: Payer: Self-pay | Admitting: Internal Medicine

## 2017-09-11 DIAGNOSIS — I10 Essential (primary) hypertension: Secondary | ICD-10-CM

## 2017-09-16 ENCOUNTER — Telehealth (HOSPITAL_COMMUNITY): Payer: Self-pay

## 2017-09-16 NOTE — Telephone Encounter (Signed)
Spoke with patient in regards to returning to Cardiac Rehab -  Scheduled orientation on 10/01/17. She will finish her 8 sessions in the 1:15pm exc class.

## 2017-09-26 ENCOUNTER — Telehealth (HOSPITAL_COMMUNITY): Payer: Self-pay | Admitting: Pharmacy Technician

## 2017-09-26 NOTE — Telephone Encounter (Signed)
Cardiac Rehab Medication Review by a Pharmacist  Does the patient  feel that his/her medications are working for him/her?  yes  Has the patient been experiencing any side effects to the medications prescribed?  Yes  Does the patient measure his/her own blood pressure or blood glucose at home?  yes   Does the patient have any problems obtaining medications due to transportation or finances?   no  Understanding of regimen: good Understanding of indications: good Potential of compliance: good    Pharmacist comments: Patient states that she checks her CBG and BP at home on a regular basis, and that her BP has been well controlled.  Her CBGs tend to be low in the morning, and above goal at night.  She endorses occasional symptoms of hypoglycemia in the morning, but otherwise has no problems with any of her medications at this time.    Alicia, [name] 09/26/2017 5:28 PM

## 2017-10-01 ENCOUNTER — Encounter (HOSPITAL_COMMUNITY): Payer: Self-pay

## 2017-10-01 ENCOUNTER — Encounter (HOSPITAL_COMMUNITY)
Admission: RE | Admit: 2017-10-01 | Discharge: 2017-10-01 | Disposition: A | Discharge status: Home / Self Care | Payer: Medicare Other | Source: Ambulatory Visit | Attending: Internal Medicine | Admitting: Internal Medicine

## 2017-10-01 ENCOUNTER — Other Ambulatory Visit: Payer: Self-pay | Admitting: Internal Medicine

## 2017-10-01 VITALS — Ht 65.5 in | Wt 305.1 lb

## 2017-10-01 DIAGNOSIS — Z48812 Encounter for surgical aftercare following surgery on the circulatory system: Secondary | ICD-10-CM | POA: Insufficient documentation

## 2017-10-01 DIAGNOSIS — Z955 Presence of coronary angioplasty implant and graft: Secondary | ICD-10-CM

## 2017-10-01 NOTE — Progress Notes (Signed)
Cardiac Individual Treatment Plan  Patient Details  Name: Alicia [name] MRN: [id number] Date of Birth: 22-Jan-1946 Referring Provider:     CARDIAC REHAB PHASE II ORIENTATION from 10/01/2017 in [facility]  Referring Provider  [name] MD      Initial Encounter Date:    CARDIAC REHAB PHASE II ORIENTATION from 10/01/2017 in [facility]  Date  10/01/17  Referring Provider  [name] MD      Visit Diagnosis: 03/18/17 DES D1  Patient's Home Medications on Admission:  Current Outpatient Medications:  .  acetaminophen (TYLENOL) 325 MG tablet, Take 2 tablets (650 mg total) by mouth every 4 (four) hours as needed for headache or mild pain., Disp: , Rfl:  .  aspirin 81 MG tablet, Take 81 mg by mouth daily., Disp: , Rfl:  .  atorvastatin (LIPITOR) 80 MG tablet, TAKE 1 TABLET BY MOUTH EVERY EVENING, Disp: 90 tablet, Rfl: 3 .  carvedilol (COREG) 25 MG tablet, Take 1 tablet (25 mg total) by mouth 2 (two) times daily., Disp: 180 tablet, Rfl: 3 .  clopidogrel (PLAVIX) 75 MG tablet, TAKE 1 TABLET (75 MG TOTAL) BY MOUTH DAILY., Disp: 90 tablet, Rfl: 2 .  DiphenhydrAMINE HCl, Sleep, (UNISOM SLEEPGELS PO), Take 1 tablet by mouth at bedtime as needed (for sleep). , Disp: , Rfl:  .  doxazosin (CARDURA) 4 MG tablet, TAKE 1 TABLET (4 MG TOTAL) BY MOUTH AT BEDTIME., Disp: 90 tablet, Rfl: 0 .  ferrous sulfate 325 (65 FE) MG tablet, TAKE 1 TABLET (325 MG TOTAL) BY MOUTH 3 (THREE) TIMES DAILY WITH MEALS., Disp: 90 tablet, Rfl: 3 .  glipiZIDE (GLUCOTROL) 10 MG tablet, TAKE 1 TABLET (10 MG TOTAL) BY MOUTH 2 (TWO) TIMES DAILY BEFORE A MEAL., Disp: 180 tablet, Rfl: 1 .  glucose blood (ONE TOUCH ULTRA TEST) test strip, 1 each by Other route 2 (two) times daily., Disp: 100 each, Rfl: 11 .  Insulin Pen Needle (B-D UF III MINI PEN NEEDLES) 31G X 5 MM MISC, 1 each by Other route 2 (two) times daily as needed., Disp: 100 each, Rfl: 1 .  isosorbide  mononitrate (IMDUR) 30 MG 24 hr tablet, Take 1 tablet (30 mg total) by mouth daily., Disp: 90 tablet, Rfl: 3 .  losartan (COZAAR) 100 MG tablet, TAKE 1 TABLET BY MOUTH EVERY DAY, Disp: 90 tablet, Rfl: 1 .  metFORMIN (GLUCOPHAGE) 1000 MG tablet, Take 1 tablet (1,000 mg total) by mouth 2 (two) times daily with a meal., Disp: 180 tablet, Rfl: 1 .  Multiple Vitamins-Minerals (MULTIVITAMIN WITH MINERALS) tablet, Take 1 tablet by mouth daily., Disp: , Rfl:  .  nitroGLYCERIN (NITROSTAT) 0.4 MG SL tablet, Place 1 tablet (0.4 mg total) under the tongue every 5 (five) minutes as needed for chest pain (up to 3 doses)., Disp: 25 tablet, Rfl: 3 .  sitaGLIPtin (JANUVIA) 100 MG tablet, Take 1 tablet (100 mg total) by mouth daily., Disp: 30 tablet, Rfl: 3 .  spironolactone (ALDACTONE) 25 MG tablet, TAKE 1 TABLET BY MOUTH EVERY DAY, Disp: 90 tablet, Rfl: 1 .  VICTOZA 18 MG/3ML SOPN, INJECT 0.2 MLS (1.2 MG TOTAL) INTO THE SKIN DAILY, Disp: 5 pen, Rfl: 0  Past Medical History: Past Medical History:  Diagnosis Date  . Anemia   . Arthritis    "right knee" (10/21/2015)  . CAD (coronary artery disease)    a. Cath 10/21/15: s/p DES to RCA: moderate diffuse stenosis of mod diagonal,  diffuse irregularity LCx and LAD. 2D echo 10/21/15: mod LVH, EF 60-65%, no RWMA, calcified MV, mod-severe LAE. 03/18/17 PCI with DES--> 1 diag  . Cataract   . Essential hypertension   . Family history of adverse reaction to anesthesia    "oldest sister, [name], related to brain formation at back of head; when they put her to sleep it's hard to wake her up"  . GERD (gastroesophageal reflux disease)   . History of blood transfusion    "related to ruptured tubal pregnancy"  . Hyperlipidemia   . Hypertensive heart disease   . Morbid obesity ([place])   . Type II diabetes mellitus (HCC)     Tobacco Use: Social History   Tobacco Use  Smoking Status Never Smoker  Smokeless Tobacco Never Used    Labs: Recent Review Flowsheet Data    Labs  for ITP Cardiac and Pulmonary Rehab Latest Ref Rng & Units 11/15/2016 02/26/2017 06/06/2017 07/14/2017 09/09/2017   Cholestrol 0 - 200 mg/dL 150 168 - 124 -   LDLCALC 0 - 99 mg/dL 80 101(H) - 77 -   HDL >40 mg/dL 42.70 43.30 - 36(L) -   Trlycerides <150 mg/dL 134.0 122.0 - 56 -   Hemoglobin A1c 4.6 - 6.5 % 15.3(H) 14.1(H) 8.1(H) - 6.7(H)   PHART 7.350 - 7.450 - - - - -   PCO2ART 35.0 - 45.0 mmHg - - - - -   HCO3 20.0 - 24.0 mEq/L - - - - -   TCO2 0 - 100 mmol/L - - - - -   O2SAT % - - - - -      Capillary Blood Glucose: Lab Results  Component Value Date   GLUCAP 240 (H) 07/15/2017   GLUCAP 114 (H) 07/15/2017   GLUCAP 116 (H) 07/14/2017   GLUCAP 148 (H) 07/14/2017   GLUCAP 136 (H) 07/14/2017     Exercise Target Goals: Date: 10/01/17  Exercise Program Goal: Individual exercise prescription set with THRR, safety & activity barriers. Participant demonstrates ability to understand and report RPE using BORG scale, to self-measure pulse accurately, and to acknowledge the importance of the exercise prescription.  Exercise Prescription Goal: Starting with aerobic activity 30 plus minutes a day, 3 days per week for initial exercise prescription. Provide home exercise prescription and guidelines that participant acknowledges understanding prior to discharge.  Activity Barriers & Risk Stratification: Activity Barriers & Cardiac Risk Stratification - 10/01/17 0902      Activity Barriers & Cardiac Risk Stratification   Activity Barriers  Arthritis;Other (comment);Deconditioning;Muscular Weakness    Comments  Swelling in left leg and foot.    Cardiac Risk Stratification  High       6 Minute Walk: 6 Minute Walk    Row Name 04/23/17 1114 10/01/17 1233 10/01/17 1250     6 Minute Walk   Phase  Initial  Initial  -   Distance  878 feet  731 feet  -   Walk Time  6 minutes  5 minutes  -   # of Rest Breaks  2  1  -   MPH  1.66  2.1  -   METS  1.45  1.3  -   RPE  15  13  -   Perceived  Dyspnea   -  2  -   VO2 Peak  5.07  4.5  -   Symptoms  No  Yes (comment)  -   Comments  -  dysnpea, ftigue  dysnpea, fatigue  Resting HR  86 bpm  83 bpm  -   Resting BP  116/78  120/80  -   Resting Oxygen Saturation   -  99 % RA  -   Exercise Oxygen Saturation  during 6 min walk  -  99 % RA  -   Max Ex. HR  125 bpm  133 bpm  -   Max Ex. BP  164/80  130/80  -   2 Minute Post BP  128/80  -  -   Row Name 10/01/17 1300         6 Minute Walk   2 Minute Post BP  118/82        Oxygen Initial Assessment:   Oxygen Re-Evaluation:   Oxygen Discharge (Final Oxygen Re-Evaluation):   Initial Exercise Prescription: Initial Exercise Prescription - 10/01/17 1200      Date of Initial Exercise RX and Referring Provider   Date  10/01/17    Referring Provider  [name] MD      Recumbant Bike   Level  1    Watts  10    Minutes  15    METs  1.7      NuStep   Level  2    SPM  80    Minutes  15    METs  1.8      Track   Laps  7    Minutes  15    METs  1.6      Prescription Details   Frequency (times per week)  3    Duration  Progress to 30 minutes of continuous aerobic without signs/symptoms of physical distress      Intensity   THRR 40-80% of Max Heartrate  60-120    Ratings of Perceived Exertion  11-13    Perceived Dyspnea  0-4      Progression   Progression  Continue to progress workloads to maintain intensity without signs/symptoms of physical distress.      Resistance Training   Training Prescription  Yes    Weight  2lbs    Reps  10-15       Perform Capillary Blood Glucose checks as needed.  Exercise Prescription Changes: Exercise Prescription Changes    Row Name 05/06/17 1600 06/04/17 1200 06/17/17 1616 07/03/17 1600 07/12/17 1529     Response to Exercise   Blood Pressure (Admit)  136/80  128/82  104/62  120/62  122/70   Blood Pressure (Exercise)  140/80  134/76  144/78  138/60  154/84   Blood Pressure (Exit)  122/66  130/80  122/74  120/70  112/70    Heart Rate (Admit)  90 bpm  68 bpm  98 bpm  87 bpm  90 bpm   Heart Rate (Exercise)  109 bpm  122 bpm  107 bpm  113 bpm  135 bpm   Heart Rate (Exit)  90 bpm  67 bpm  98 bpm  83 bpm  96 bpm   Rating of Perceived Exertion (Exercise)  '13  14  13  13  12   '$ Symptoms  none  none  none  none  none   Comments  Pt was oriented to exercise equipment 04/29/17  Pt was oriented to exercise equipment 04/29/17  -  -  -   Duration  Continue with 30 min of aerobic exercise without signs/symptoms of physical distress.  Continue with 30 min of aerobic exercise without signs/symptoms of physical distress.  Continue with 30  min of aerobic exercise without signs/symptoms of physical distress.  Continue with 30 min of aerobic exercise without signs/symptoms of physical distress.  Continue with 30 min of aerobic exercise without signs/symptoms of physical distress.   Intensity  THRR unchanged  THRR unchanged  THRR unchanged  THRR unchanged  THRR unchanged     Progression   Progression  Continue to progress workloads to maintain intensity without signs/symptoms of physical distress.  Continue to progress workloads to maintain intensity without signs/symptoms of physical distress.  Continue to progress workloads to maintain intensity without signs/symptoms of physical distress.  Continue to progress workloads to maintain intensity without signs/symptoms of physical distress.  Continue to progress workloads to maintain intensity without signs/symptoms of physical distress.   Average METs  1.8  1.8  1.7  1.9  2.1     Resistance Training   Training Prescription  Yes  Yes  Yes  Yes  Yes   Weight  1lb  2lbs  2lbs  2lbs  2lbs   Reps  10-15  10-15  10-15  10-15  10-15   Time  10 Minutes  10 Minutes  10 Minutes  10 Minutes  10 Minutes     Recumbant Bike   Level  2  -  2.5  2.5  2.5   Watts  15  -  12  12  -   Minutes  15  -  '15  15  15   '$ METs  1.9  -  1.6  1.6  2.1     NuStep   Level  '2  4  4  4  '$ -   SPM  80  80  80  85  -    Minutes  '15  15  15  15  '$ -   METs  1.7  1.9  1.9  2.1  -     Track   Laps  -  7  -  -  10   Minutes  -  15  -  -  15   METs  -  1.6  -  -  2.16     Home Exercise Plan   Plans to continue exercise at  -  Home (comment)  Home (comment) recumbent bike  Home (comment) recumbent bike  Home (comment) recumbent bike   Frequency  -  Add 2 additional days to program exercise sessions.  Add 2 additional days to program exercise sessions.  Add 2 additional days to program exercise sessions.  Add 2 additional days to program exercise sessions.   Initial Home Exercises Provided  -  05/15/17  05/15/17  05/15/17  05/15/17      Exercise Comments: Exercise Comments    Row Name 05/06/17 1634 06/04/17 1240 07/03/17 1615 07/30/17 1532 08/28/17 1534   Exercise Comments  Pt was oriented to exercise eqipment on 04/29/17. Pt is tolerating exercise well; will continue to monitor activity levels and exercise progression.  Reviewed METs and goals. Pt is tolerating exercise well; will continue to monitor exercise progression.  Reviewed METs and goals. Pt is tolerating exercise well; will continue to monitor exercise progression.  Pt is on medical leave due to syncope episode at [employer]. Pt last exercise session was on 07/12/17; will f/u.  Pt completed 28 session of cardiac rehab. Pt exited program early due to syncope episodes leading to ED visit.       Exercise Goals and Review: Exercise Goals    Row Name 04/23/17 1116 10/01/17  2683 10/01/17 1242         Exercise Goals   Increase Physical Activity  Yes  -  Yes     Intervention  Provide advice, education, support and counseling about physical activity/exercise needs.;Develop an individualized exercise prescription for aerobic and resistive training based on initial evaluation findings, risk stratification, comorbidities and participant's personal goals.  -  Provide advice, education, support and counseling about physical activity/exercise needs.;Develop an  individualized exercise prescription for aerobic and resistive training based on initial evaluation findings, risk stratification, comorbidities and participant's personal goals.     Expected Outcomes  Achievement of increased cardiorespiratory fitness and enhanced flexibility, muscular endurance and strength shown through measurements of functional capacity and personal statement of participant.  -  Achievement of increased cardiorespiratory fitness and enhanced flexibility, muscular endurance and strength shown through measurements of functional capacity and personal statement of participant.     Increase Strength and Stamina  Yes  - be active.   Yes     Intervention  Provide advice, education, support and counseling about physical activity/exercise needs.;Develop an individualized exercise prescription for aerobic and resistive training based on initial evaluation findings, risk stratification, comorbidities and participant's personal goals.  -  Provide advice, education, support and counseling about physical activity/exercise needs.;Develop an individualized exercise prescription for aerobic and resistive training based on initial evaluation findings, risk stratification, comorbidities and participant's personal goals.     Expected Outcomes  Achievement of increased cardiorespiratory fitness and enhanced flexibility, muscular endurance and strength shown through measurements of functional capacity and personal statement of participant.  -  Achievement of increased cardiorespiratory fitness and enhanced flexibility, muscular endurance and strength shown through measurements of functional capacity and personal statement of participant.     Able to understand and use rate of perceived exertion (RPE) scale  -  Yes  Yes     Intervention  -  Provide education and explanation on how to use RPE scale  Provide education and explanation on how to use RPE scale     Expected Outcomes  -  Short Term: Able to use RPE  daily in rehab to express subjective intensity level;Long Term:  Able to use RPE to guide intensity level when exercising independently  Short Term: Able to use RPE daily in rehab to express subjective intensity level;Long Term:  Able to use RPE to guide intensity level when exercising independently     Knowledge and understanding of Target Heart Rate Range (THRR)  -  Yes  Yes     Intervention  -  Provide education and explanation of THRR including how the numbers were predicted and where they are located for reference  Provide education and explanation of THRR including how the numbers were predicted and where they are located for reference     Expected Outcomes  -  Short Term: Able to state/look up THRR;Long Term: Able to use THRR to govern intensity when exercising independently;Short Term: Able to use daily as guideline for intensity in rehab  Short Term: Able to state/look up THRR;Long Term: Able to use THRR to govern intensity when exercising independently;Short Term: Able to use daily as guideline for intensity in rehab     Able to check pulse independently  -  Yes  Yes     Intervention  -  Provide education and demonstration on how to check pulse in carotid and radial arteries.;Review the importance of being able to check your own pulse for safety during independent exercise  Review the  importance of being able to check your own pulse for safety during independent exercise;Provide education and demonstration on how to check pulse in carotid and radial arteries.     Expected Outcomes  -  Short Term: Able to explain why pulse checking is important during independent exercise;Long Term: Able to check pulse independently and accurately  Long Term: Able to check pulse independently and accurately;Short Term: Able to explain why pulse checking is important during independent exercise     Understanding of Exercise Prescription  -  Yes  Yes     Intervention  -  Provide education, explanation, and written  materials on patient's individual exercise prescription  Provide education, explanation, and written materials on patient's individual exercise prescription     Expected Outcomes  -  Short Term: Able to explain program exercise prescription;Long Term: Able to explain home exercise prescription to exercise independently  Short Term: Able to explain program exercise prescription;Long Term: Able to explain home exercise prescription to exercise independently        Exercise Goals Re-Evaluation : Exercise Goals Re-Evaluation    Row Name 05/15/17 1632 06/04/17 1742 07/02/17 1017 07/30/17 1531       Exercise Goal Re-Evaluation   Exercise Goals Review  Increase Strenth and Stamina;Increase Physical Activity  -  Increase Physical Activity;Increase Strenth and Stamina  Increase Physical Activity;Able to understand and use rate of perceived exertion (RPE) scale;Knowledge and understanding of Target Heart Rate Range (THRR);Understanding of Exercise Prescription;Increase Strength and Stamina;Able to check pulse independently    Comments  Reviewed home exercise with pt today.  Pt plans to use stationary bike and leg exercises to build on strength and stamina. Pt will start at 5 min. 2x/day on bike. Discussed exercise progression and safety.  Reviewed THR, pulse, RPE, sign and symptoms, NTG use, and when to call 911 or MD.  Also discussed weather considerations and indoor options.  Pt voiced understanding.   Pt plans to use stationary bike and leg exercises to build on strength and stamina. Pt will start at 5 min. 2x/day on bike.   Pt continues to improve in aerobic fitness in cardiac rehab. Pt has increase paced step climbing and is able to climb stairs for 2.45 without stopping.   Pt has improved and increased walking tolerance    Expected Outcomes  Pt will build on aerobic capacity and overall functional mobility.  Pt will build on aerobic capacity and overall functional mobility.  Pt will build on aerobic  capacity (stair climbing) and overall functional mobility (negogiating stairs, 3 level home).  Pt will build on aerobic capacity (stair climbing) and overall functional mobility of walking tolerance and negogiating stairs, 3 level home.        Discharge Exercise Prescription (Final Exercise Prescription Changes): Exercise Prescription Changes - 07/12/17 1529      Response to Exercise   Blood Pressure (Admit)  122/70    Blood Pressure (Exercise)  154/84    Blood Pressure (Exit)  112/70    Heart Rate (Admit)  90 bpm    Heart Rate (Exercise)  135 bpm    Heart Rate (Exit)  96 bpm    Rating of Perceived Exertion (Exercise)  12    Symptoms  none    Duration  Continue with 30 min of aerobic exercise without signs/symptoms of physical distress.    Intensity  THRR unchanged      Progression   Progression  Continue to progress workloads to maintain intensity without signs/symptoms of physical  distress.    Average METs  2.1      Resistance Training   Training Prescription  Yes    Weight  2lbs    Reps  10-15    Time  10 Minutes      Recumbant Bike   Level  2.5    Minutes  15    METs  2.1      Track   Laps  10    Minutes  15    METs  2.16      Home Exercise Plan   Plans to continue exercise at  Home (comment) recumbent bike   recumbent bike   Frequency  Add 2 additional days to program exercise sessions.    Initial Home Exercises Provided  05/15/17       Nutrition:  Target Goals: Understanding of nutrition guidelines, daily intake of sodium '1500mg'$ , cholesterol '200mg'$ , calories 30% from fat and 7% or less from saturated fats, daily to have 5 or more servings of fruits and vegetables.  Biometrics: Pre Biometrics - 10/01/17 1243      Pre Biometrics   Height  5' 5.5" (1.664 m)    Weight  305 lb 1.9 oz (138.4 kg)  (Abnormal)     Waist Circumference  48 inches    Hip Circumference  60 inches    Waist to Hip Ratio  0.8 %    BMI (Calculated)  49.98    Triceps Skinfold  70 mm     % Body Fat  60.6 %    Grip Strength  23 kg    Flexibility  12 in    Single Leg Stand  2 seconds        Nutrition Therapy Plan and Nutrition Goals: Nutrition Therapy & Goals - 10/01/17 1144      Nutrition Therapy   Diet  Carb Modified, Therapeutic Lifestyle Changes      Personal Nutrition Goals   Nutrition Goal  Pt to identify and limit food sources of saturated fat, trans fat, and sodium    Personal Goal #2  Pt to identify food quantities necessary to achieve weight loss of 6-20 lb at graduation from cardiac rehab.      Intervention Plan   Intervention  Prescribe, educate and counsel regarding individualized specific dietary modifications aiming towards targeted core components such as weight, hypertension, lipid management, diabetes, heart failure and other comorbidities.    Expected Outcomes  Long Term Goal: Adherence to prescribed nutrition plan.;Short Term Goal: Understand basic principles of dietary content, such as calories, fat, sodium, cholesterol and nutrients.       Nutrition Discharge: Nutrition Scores: Nutrition Assessments - 10/01/17 1145      MEDFICTS Scores   Pre Score  38    Post Score  46    Score Difference  8       Nutrition Goals Re-Evaluation: Nutrition Goals Re-Evaluation    Row Name 08/30/17 1156             Goals   Current Weight  300 lb 15.4 oz (136.5 kg)       Nutrition Goal  Wt loss of 1-2 lb/week to a wt loss goal of 6-24 lb at graduation from [facility]  Pt has lost 7.7 lb since admission. Wt loss goal met. No updated A1c available to assess Goal #2.         Personal Goal #2 Re-Evaluation   Personal Goal #2  Improved glycemic  control as evidenced by A1c trending toward 7.0 or less.           Nutrition Goals Re-Evaluation: Nutrition Goals Re-Evaluation    [place] Name 08/30/17 1156             Goals   Current Weight  300 lb 15.4 oz (136.5 kg)       Nutrition Goal  Wt loss of 1-2 lb/week to a wt loss goal of 6-24  lb at graduation from [place]  Pt has lost 7.7 lb since admission. Wt loss goal met. No updated A1c available to assess Goal #2.         Personal Goal #2 Re-Evaluation   Personal Goal #2  Improved glycemic control as evidenced by A1c trending toward 7.0 or less.           Nutrition Goals Discharge (Final Nutrition Goals Re-Evaluation): Nutrition Goals Re-Evaluation - 08/30/17 1156      Goals   Current Weight  300 lb 15.4 oz (136.5 kg)    Nutrition Goal  Wt loss of 1-2 lb/week to a wt loss goal of 6-24 lb at graduation from [place]  Pt has lost 7.7 lb since admission. Wt loss goal met. No updated A1c available to assess Goal #2.      Personal Goal #2 Re-Evaluation   Personal Goal #2  Improved glycemic control as evidenced by A1c trending toward 7.0 or less.        Psychosocial: Target Goals: Acknowledge presence or absence of significant depression and/or stress, maximize coping skills, provide positive support system. Participant is able to verbalize types and ability to use techniques and skills needed for reducing stress and depression.  Initial Review & Psychosocial Screening: Initial Psych Review & Screening - 10/01/17 1259      Initial Review   Current issues with  Current Stress Concerns    Source of Stress Concerns  Chronic Illness;Family    Comments  caregiver fatigue, progression of CAD      Family Dynamics   Good Support System?  Yes Sisters, strong faith base   Sisters, strong faith base     Barriers   Psychosocial barriers to participate in program  The patient should benefit from training in stress management and relaxation.      Screening Interventions   Interventions  Encouraged to exercise;Provide feedback about the scores to participant       Quality of Life Scores: Quality of Life - 10/01/17 1251      Quality of Life Scores   Health/Function Pre  21.33 %    Socioeconomic Pre  25.21 %    Psych/Spiritual Pre  25.07  %    Family Pre  22.7 %    GLOBAL Pre  23.1 %       PHQ-9: Recent Review Flowsheet Data    Depression screen [facility] 2/9 04/30/2017 10/16/2016 03/16/2016 11/23/2015 10/10/2015   Decreased Interest 0 0 0 0 0   Down, Depressed, Hopeless 1  0 0 0 0   PHQ - 2 Score 1 0 0 0 0     Interpretation of Total Score  Total Score Depression Severity:  1-4 = Minimal depression, 5-9 = Mild depression, 10-14 = Moderate depression, 15-19 = Moderately severe depression, 20-27 = Severe depression   Psychosocial Evaluation and Intervention: Psychosocial Evaluation - 08/20/17 1107      Discharge Psychosocial Assessment & Intervention   Comments  pt exhibits health related anxiety from recent hospitalization however overall positive outlook with good coping skills.        Psychosocial Re-Evaluation: Psychosocial Re-Evaluation    [place] Name 05/07/17 1651 06/03/17 1633 07/04/17 1054         Psychosocial Re-Evaluation   Current issues with  Current Stress Concerns;History of Depression  Current Stress Concerns;History of Depression  Current Stress Concerns;History of Depression     Comments  pt exhibits caregiver stress and fatigue. pt encouraged to participate in self care activities.    pt exhibits caregiver stress and fatigue. pt encouraged to participate in self care activities.    pt exhibits caregiver stress and fatigue. pt encouraged to participate in self care activities.   pt making positive interactions with her peers and classmates      Expected Outcomes  pt will demonstrate positive outlook with good coping skills and self care managment.   pt will demonstrate positive outlook with good coping skills and self care managment.   pt will demonstrate positive outlook with good coping skills and self care managment.      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  unfortunately her sister's failing health is making home caregiving more challenging.  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  unfortunately her sister's failing health is making home caregiving more challenging.  pt also expresses concern about progression of her CAD       Initial Review   Source of Stress Concerns  Chronic Illness;Family  Chronic Illness;Family  Chronic Illness;Family        Psychosocial Discharge (Final Psychosocial Re-Evaluation): Psychosocial Re-Evaluation - 07/04/17 1054      Psychosocial Re-Evaluation   Current issues with  Current Stress Concerns;History of Depression    Comments  pt exhibits caregiver stress and fatigue. pt encouraged to participate in self care activities.   pt making positive interactions with her peers and classmates     Expected Outcomes  pt will demonstrate positive outlook with good coping skills and self care managment.     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  unfortunately her sister's failing health is making home caregiving more challenging.  pt also expresses concern about progression of her CAD      Initial Review   Source of Stress Concerns  Chronic Illness;Family       Vocational Rehabilitation: Provide vocational rehab assistance to qualifying candidates.   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 Participant will state understanding/return demonstration of  topics presented.  Learning Barriers/Preferences: Learning Barriers/Preferences - 10/01/17 0902      Learning Barriers/Preferences   Learning Barriers  Sight    Learning Preferences  Written Material       Education Topics: Count Your Pulse:  -Group instruction provided by verbal instruction, demonstration, patient participation and written materials to support subject.  Instructors address importance of being able to find your pulse and how to count your pulse when at home without a heart monitor.  Patients get hands on experience counting their pulse with staff help and individually.   Heart Attack, Angina, and Risk Factor Modification:  -Group instruction provided by verbal instruction, video, and written materials to support subject.  Instructors address signs and symptoms of angina and heart attacks.    Also discuss risk factors for heart disease and how to make changes to improve heart health risk factors.   Functional Fitness:  -Group instruction provided by verbal instruction, demonstration, patient participation, and written materials to support subject.  Instructors address safety measures for doing things around the house.  Discuss how to get up and down off the floor, how to pick things up properly, how to safely get out of a chair without assistance, and balance training.   CARDIAC REHAB PHASE II EXERCISE from 07/05/2017 in [place]  Date  07/05/17  Instruction Review Code  2- meets goals/outcomes      Meditation and Mindfulness:  -Group instruction provided by verbal  instruction, patient participation, and written materials to support subject.  Instructor addresses importance of mindfulness and meditation practice to help reduce stress and improve awareness.  Instructor also leads participants through a meditation exercise.    Stretching for Flexibility and Mobility:  -Group instruction provided by verbal instruction, patient participation, and written materials to support subject.  Instructors lead participants through series of stretches that are designed to increase flexibility thus improving mobility.  These stretches are additional exercise for major muscle groups that are typically performed during regular warm up and cool down.   Hands Only CPR:  -Group verbal, video, and participation provides a basic overview of AHA guidelines for community CPR. Role-play of emergencies allow participants the opportunity to practice calling for help and chest compression technique with discussion of AED use.   Hypertension: -Group verbal and written instruction that provides a basic overview of hypertension including the most recent diagnostic guidelines, risk factor reduction with self-care instructions and medication management.    Nutrition I class: Heart Healthy Eating:  -Group instruction provided by PowerPoint slides, verbal discussion, and written materials to support subject matter. The instructor gives an explanation and review of the Therapeutic Lifestyle Changes diet recommendations, which includes a discussion on lipid goals, dietary fat, sodium, fiber, plant stanol/sterol esters, sugar, and the components of a well-balanced, healthy diet.   Nutrition II class: Lifestyle Skills:  -Group instruction provided by PowerPoint slides, verbal discussion, and written materials to support subject matter. The instructor gives an explanation and review of label reading, grocery shopping for heart health, heart healthy recipe modifications, and ways to make  healthier choices when eating out.   Diabetes Question & Answer:  -Group instruction provided by PowerPoint slides, verbal discussion, and written materials to support subject matter. The instructor gives an explanation and review of diabetes co-morbidities, pre- and post-prandial blood glucose goals, pre-exercise blood glucose goals, signs, symptoms, and treatment of hypoglycemia and hyperglycemia, and foot care basics.   CARDIAC REHAB PHASE II EXERCISE from 07/05/2017 in  [place]  Date  05/31/17  Educator  RD  Instruction Review Code  2- meets goals/outcomes      Diabetes Blitz:  -Group instruction provided by PowerPoint slides, verbal discussion, and written materials to support subject matter. The instructor gives an explanation and review of the physiology behind type 1 and type 2 diabetes, diabetes medications and rational behind using different medications, pre- and post-prandial blood glucose recommendations and Hemoglobin A1c goals, diabetes diet, and exercise including blood glucose guidelines for exercising safely.    Portion Distortion:  -Group instruction provided by PowerPoint slides, verbal discussion, written materials, and food models to support subject matter. The instructor gives an explanation of serving size versus portion size, changes in portions sizes over the last 20 years, and what consists of a serving from each food group.   CARDIAC REHAB PHASE II EXERCISE from 07/05/2017 in [place]  Date  05/08/17  Educator  RD  Instruction Review Code  2- meets goals/outcomes      Stress Management:  -Group instruction provided by verbal instruction, video, and written materials to support subject matter.  Instructors review role of stress in heart disease and how to cope with stress positively.     CARDIAC REHAB PHASE II EXERCISE from 07/05/2017 in [place]  Date  05/15/17    Instruction Review Code  2- meets goals/outcomes      Exercising on Your Own:  -Group instruction provided by verbal instruction, power point, and written materials to support subject.  Instructors discuss benefits of exercise, components of exercise, frequency and intensity of exercise, and end points for exercise.  Also discuss use of nitroglycerin and activating EMS.  Review options of places to exercise outside of rehab.  Review guidelines for sex with heart disease.   Cardiac Drugs I:  -Group instruction provided by verbal instruction and written materials to support subject.  Instructor reviews cardiac drug classes: antiplatelets, anticoagulants, beta blockers, and statins.  Instructor discusses reasons, side effects, and lifestyle considerations for each drug class.   Cardiac Drugs II:  -Group instruction provided by verbal instruction and written materials to support subject.  Instructor reviews cardiac drug classes: angiotensin converting enzyme inhibitors (ACE-I), angiotensin II receptor blockers (ARBs), nitrates, and calcium channel blockers.  Instructor discusses reasons, side effects, and lifestyle considerations for each drug class.   CARDIAC REHAB PHASE II EXERCISE from 07/05/2017 in [place]  Date  05/01/17  Educator  Pharm D  Instruction Review Code  2- meets goals/outcomes      Anatomy and Physiology of the Circulatory System:  Group verbal and written instruction and models provide basic cardiac anatomy and physiology, with the coronary electrical and arterial systems. Review of: AMI, Angina, Valve disease, Heart Failure, Peripheral Artery Disease, Cardiac Arrhythmia, Pacemakers, and the ICD.   CARDIAC REHAB PHASE II EXERCISE from 07/05/2017 in [place]  Date  06/19/17  Instruction Review Code  2- meets goals/outcomes      Other Education:  -Group or individual verbal, written, or video instructions  that support the educational goals of the cardiac rehab program.   Knowledge Questionnaire Score: Knowledge Questionnaire Score - 10/01/17 1258      Knowledge Questionnaire Score   Pre Score  21/24        Core Components/Risk Factors/Patient Goals at Admission: Personal Goals and Risk Factors at Admission - 10/01/17 1244  Core Components/Risk Factors/Patient Goals on Admission    Weight Management  Yes;Obesity;Weight Maintenance;Weight Loss    Intervention  Weight Management: Develop a combined nutrition and exercise program designed to reach desired caloric intake, while maintaining appropriate intake of nutrient and fiber, sodium and fats, and appropriate energy expenditure required for the weight goal.;Weight Management: Provide education and appropriate resources to help participant work on and attain dietary goals.;Obesity: Provide education and appropriate resources to help participant work on and attain dietary goals.;Weight Management/Obesity: Establish reasonable short term and long term weight goals.    Admit Weight  305 lb 1.9 oz (138.4 kg)    Goal Weight: Short Term  300 lb (136.1 kg)    Goal Weight: Long Term  290 lb (131.5 kg)    Expected Outcomes  Short Term: Continue to assess and modify interventions until short term weight is achieved;Long Term: Adherence to nutrition and physical activity/exercise program aimed toward attainment of established weight goal;Weight Maintenance: Understanding of the daily nutrition guidelines, which includes 25-35% calories from fat, 7% or less cal from saturated fats, less than '200mg'$  cholesterol, less than 1.5gm of sodium, & 5 or more servings of fruits and vegetables daily;Weight Loss: Understanding of general recommendations for a balanced deficit meal plan, which promotes 1-2 lb weight loss per week and includes a negative energy balance of [phone] kcal/d;Understanding recommendations for meals to include 15-35% energy as protein, 25-35%  energy from fat, 35-60% energy from carbohydrates, less than '200mg'$  of dietary cholesterol, 20-35 gm of total fiber daily;Understanding of distribution of calorie intake throughout the day with the consumption of 4-5 meals/snacks    Improve shortness of breath with ADL's  Yes    Intervention  Provide education, individualized exercise plan and daily activity instruction to help decrease symptoms of SOB with activities of daily living.    Expected Outcomes  Short Term: Achieves a reduction of symptoms when performing activities of daily living.    Diabetes  Yes    Intervention  Provide education about signs/symptoms and action to take for hypo/hyperglycemia.;Provide education about proper nutrition, including hydration, and aerobic/resistive exercise prescription along with prescribed medications to achieve blood glucose in normal ranges: Fasting glucose 65-99 mg/dL    Expected Outcomes  Short Term: Participant verbalizes understanding of the signs/symptoms and immediate care of hyper/hypoglycemia, proper foot care and importance of medication, aerobic/resistive exercise and nutrition plan for blood glucose control.    Hypertension  Yes    Intervention  Provide education on lifestyle modifcations including regular physical activity/exercise, weight management, moderate sodium restriction and increased consumption of fresh fruit, vegetables, and low fat dairy, alcohol moderation, and smoking cessation.;Monitor prescription use compliance.    Expected Outcomes  Short Term: Continued assessment and intervention until BP is < 140/90m HG in hypertensive participants. < 130/860mHG in hypertensive participants with diabetes, heart failure or chronic kidney disease.;Long Term: Maintenance of blood pressure at goal levels.    Lipids  Yes    Intervention  Provide education and support for participant on nutrition & aerobic/resistive exercise along with prescribed medications to achieve LDL '70mg'$ , HDL >'40mg'$ .     Expected Outcomes  Short Term: Participant states understanding of desired cholesterol values and is compliant with medications prescribed. Participant is following exercise prescription and nutrition guidelines.;Long Term: Cholesterol controlled with medications as prescribed, with individualized exercise RX and with personalized nutrition plan. Value goals: LDL < '70mg'$ , HDL > 40 mg.    Stress  Yes    Intervention  Offer individual and/or  small group education and counseling on adjustment to heart disease, stress management and health-related lifestyle change. Teach and support self-help strategies.;Refer participants experiencing significant psychosocial distress to appropriate mental health specialists for further evaluation and treatment. When possible, include family members and significant others in education/counseling sessions.    Expected Outcomes  Short Term: Participant demonstrates changes in health-related behavior, relaxation and other stress management skills, ability to obtain effective social support, and compliance with psychotropic medications if prescribed.;Long Term: Emotional wellbeing is indicated by absence of clinically significant psychosocial distress or social isolation.       Core Components/Risk Factors/Patient Goals Review:  Goals and Risk Factor Review    Row Name 05/07/17 1650 06/03/17 1632 07/04/17 1053 08/20/17 1105       Core Components/Risk Factors/Patient Goals Review   Personal Goals Review  Weight Management/Obesity;Diabetes;Hypertension;Stress  Weight Management/Obesity;Diabetes;Hypertension;Stress  Weight Management/Obesity;Diabetes;Hypertension;Stress  Weight Management/Obesity;Diabetes;Hypertension;Stress    Review  pt with multiple risk factors for CAD displays eagerness to participate in CR group exercise program.  pt with multiple risk factors for CAD displays eagerness to participate in CR group exercise program.  pt is pleased with her increased ability  to perform household activities. pt is especially excited to spend this week with her 47yo granddaughter.   pt with multiple risk factors for CAD displays eagerness to participate in CR group exercise program.  pt is pleased with her increased ability to perform household activities. pt demonstrates commitment to participate in CR group exercise classes. .   pt exited program early due to medical hospitalization with home health RN/PT upon discharge.  pt interested in resuming CR when cleared medically.      Expected Outcomes  pt will participate in CR exercise, nutrition, and lifestyle education classes to decrease overall risk factors.   pt will participate in CR exercise, nutrition, and lifestyle education classes to decrease overall risk factors.   pt will participate in CR exercise, nutrition, and lifestyle education classes to decrease overall risk factors.   pt will continue physical activity, nutrition and lifestyle modifications to decrease overall risk factors.        Core Components/Risk Factors/Patient Goals at Discharge (Final Review):  Goals and Risk Factor Review - 08/20/17 1105      Core Components/Risk Factors/Patient Goals Review   Personal Goals Review  Weight Management/Obesity;Diabetes;Hypertension;Stress    Review  pt exited program early due to medical hospitalization with home health RN/PT upon discharge.  pt interested in resuming CR when cleared medically.      Expected Outcomes  pt will continue physical activity, nutrition and lifestyle modifications to decrease overall risk factors.        ITP Comments: ITP Comments    Row Name 04/23/17 0815 05/07/17 1649 06/03/17 1632 07/04/17 1053 09/10/17 0719   ITP Comments  Medical Director- Dr. Fransico Him, MD.  Medical Director- Dr. Fransico Him, MD.  Medical Director- Dr. Fransico Him, MD.  Medical Director- Dr. Fransico Him, MD.  pt exited program early at 28 sessions due to hospitlization for syncope and weakness. pt is  receiving home physical therapy.   [place] Name 10/01/17 0855           ITP Comments  Dr. Fransico Him, Medical Director           Comments: Patient attended orientation from 0830  to 1051  to review rules and guidelines for program. Completed 6 minute walk test, Intitial ITP, and exercise prescription.  VSS. Telemetry-sinus rhythm, T wave in version,  Asymptomatic.Andi Hence, RN, BSN Cardiac Pulmonary Rehab

## 2017-10-01 NOTE — Progress Notes (Signed)
Alicia [name] 71 y.o. female DOB: [birth year] MRN: [id number]      Nutrition Note  1. 03/18/17 DES D1    Past Medical History:  Diagnosis Date  . Anemia   . Arthritis    "right knee" (10/21/2015)  . CAD (coronary artery disease)    a. Cath 10/21/15: s/p DES to RCA: moderate diffuse stenosis of mod diagonal, diffuse irregularity LCx and LAD. 2D echo 10/21/15: mod LVH, EF 60-65%, no RWMA, calcified MV, mod-severe LAE. 03/18/17 PCI with DES--> 1 diag  . Cataract   . Essential hypertension   . Family history of adverse reaction to anesthesia    "oldest sister, [name], related to brain formation at back of head; when they put her to sleep it's hard to wake her up"  . GERD (gastroesophageal reflux disease)   . History of blood transfusion    "related to ruptured tubal pregnancy"  . Hyperlipidemia   . Hypertensive heart disease   . Morbid obesity ([place])   . Type II diabetes mellitus ([place])    Meds reviewed. Glipizide, Metformin, Januvia, Victoza noted  HT: Ht Readings from Last 1 Encounters:  08/06/17 '5\' 5"'$  (1.651 m)    WT: Wt Readings from Last 3 Encounters:  08/06/17 (!) 305 lb 1.9 oz (138.4 kg)  07/23/17 (!) 303 lb (137.4 kg)  07/15/17 299 lb 11.2 oz (135.9 kg)     BMI 51.1   Current tobacco use? No  Labs:  Lipid Panel     Component Value Date/Time   CHOL 124 07/14/2017 0638   TRIG 56 07/14/2017 0638   HDL 36 (L) 07/14/2017 0638   CHOLHDL 3.4 07/14/2017 0638   VLDL 11 07/14/2017 0638   LDLCALC 77 07/14/2017 0638    Lab Results  Component Value Date   HGBA1C 6.7 (H) 09/09/2017   CBG (last 3)  No results for input(s): GLUCAP in the last 72 hours.  Nutrition Note Spoke with pt. Nutrition plan and goals reviewed with pt. Pt is following Step 1 of the Therapeutic Lifestyle Changes diet. Pt wants to lose wt. Wt loss tips reviewed. Pt is diabetic. Last A1c indicates blood glucose well-controlled.Pt checks CBG's 3-4 times a day. Per discussion, pt has hypoglycemic episodes  twice daily. Pt's beta blocker medication masking s/s of hypoglycemia discussed. Pt expressed understanding of the information reviewed. Pt aware of nutrition education classes offered.  Nutrition Diagnosis ? Food-and nutrition-related knowledge deficit related to lack of exposure to information as related to diagnosis of: ? CVD ? DM  ? Obesity related to excessive energy intake as evidenced by a BMI of 51.1  Nutrition Intervention ? Pt's individual nutrition plan and goals reviewed with pt.  Nutrition Goal(s):  ? Pt to identify and limit food sources of saturated fat, trans fat, and sodium ? Pt to identify food quantities necessary to achieve weight loss of 6-20 lb at graduation from cardiac rehab.  Plan:  Pt to attend nutrition classes ? Portion Distortion - met 05/08/17 ? Diabetes Q & A - met 05/31/17 Will provide client-centered nutrition education as part of interdisciplinary care.   Monitor and evaluate progress toward nutrition goal with team.  Derek Mound, M.Ed, RD, LDN, CDE 10/01/2017 11:37 AM

## 2017-10-03 ENCOUNTER — Encounter: Payer: Self-pay | Admitting: Internal Medicine

## 2017-10-07 ENCOUNTER — Encounter (HOSPITAL_COMMUNITY)
Admission: RE | Admit: 2017-10-07 | Discharge: 2017-10-07 | Disposition: A | Discharge status: Home / Self Care | Payer: Medicare Other | Source: Ambulatory Visit | Attending: Internal Medicine | Admitting: Internal Medicine

## 2017-10-07 ENCOUNTER — Encounter (HOSPITAL_COMMUNITY): Payer: Self-pay

## 2017-10-07 DIAGNOSIS — Z955 Presence of coronary angioplasty implant and graft: Secondary | ICD-10-CM

## 2017-10-07 DIAGNOSIS — Z48812 Encounter for surgical aftercare following surgery on the circulatory system: Secondary | ICD-10-CM | POA: Diagnosis not present

## 2017-10-07 LAB — GLUCOSE, CAPILLARY
GLUCOSE-CAPILLARY: 119 mg/dL — AB (ref 65–99)
Glucose-Capillary: 251 mg/dL — ABNORMAL HIGH (ref 65–99)

## 2017-10-07 NOTE — Progress Notes (Signed)
Daily Session Note  Patient Details  Name: Alicia [name] MRN: [id number] Date of Birth: 1946-10-11 Referring Provider:     CARDIAC REHAB PHASE II ORIENTATION from 10/01/2017 in [place]  Referring Provider  [name] MD      Encounter Date: 10/07/2017  Check In: Session Check In - 10/07/17 1458      Check-In   Location  [facility]    Staff Present  Maurice Small, RN, BSN;Amber Fair, MS, ACSM RCEP, Exercise Physiologist;Jemma Rasp, RN, Marga Melnick, RN, Mosie Epstein, MS,ACSM CEP, Exercise Physiologist    Supervising physician immediately available to respond to emergencies  [facility] immediately available    Physician(s)  Dr. Maylene Roes    Medication changes reported      No    Fall or balance concerns reported     No    Tobacco Cessation  No Change    Warm-up and Cool-down  Performed as group-led instruction    Resistance Training Performed  Yes    VAD Patient?  No      Pain Assessment   Currently in Pain?  No/denies    Multiple Pain Sites  No       Capillary Blood Glucose: Results for orders placed or performed during the hospital encounter of 10/07/17 (from the past 24 hour(s))  Glucose, capillary     Status: Abnormal   Collection Time: 10/07/17  1:41 PM  Result Value Ref Range   Glucose-Capillary 251 (H) 65 - 99 mg/dL  Glucose, capillary     Status: Abnormal   Collection Time: 10/07/17  2:34 PM  Result Value Ref Range   Glucose-Capillary 119 (H) 65 - 99 mg/dL    Exercise Prescription Changes - 10/07/17 1500      Response to Exercise   Blood Pressure (Admit)  123/62    Blood Pressure (Exercise)  140/80    Blood Pressure (Exit)  124/70    Heart Rate (Admit)  97 bpm    Heart Rate (Exercise)  117 bpm    Heart Rate (Exit)  89 bpm    Rating of Perceived Exertion (Exercise)  12    Symptoms  none    Comments  Pt was oriented to exercise equipment 10/07/17    Duration  Continue with 30 min of  aerobic exercise without signs/symptoms of physical distress.    Intensity  THRR unchanged      Progression   Progression  Continue to progress workloads to maintain intensity without signs/symptoms of physical distress.    Average METs  1.8      Resistance Training   Training Prescription  Yes    Weight  2lbs    Reps  10-15    Time  10 Minutes      Recumbant Bike   Level  1    Watts  10    Minutes  15    METs  1.6      NuStep   Level  2    SPM  85    Minutes  15    METs  2       Social History   Tobacco Use  Smoking Status Never Smoker  Smokeless Tobacco Never Used    Goals Met:  Exercise tolerated well  Goals Unmet:  Not Applicable  Comments: Pt started cardiac rehab today.  Pt tolerated light exercise without difficulty. VSS, telemetry-sinus rhythm,  asymptomatic.  Medication list reconciled. Pt denies barriers to [employer]  compliance.  PSYCHOSOCIAL ASSESSMENT:  PHQ-0.  Pt exhibits positive coping skills, hopeful outlook with supportive family. Pt does have situational stress with family members staying in her home.  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  Pt enjoys spending time with her sister and granddaughter.  Pt also enjoys writing and reading. Pt oriented to exercise equipment and routine.    Understanding verbalized.   Dr. Fransico Him is Medical Director for Cardiac Rehab at [facility].

## 2017-10-09 ENCOUNTER — Other Ambulatory Visit: Payer: Self-pay | Admitting: Internal Medicine

## 2017-10-09 ENCOUNTER — Encounter (HOSPITAL_COMMUNITY)
Admission: RE | Admit: 2017-10-09 | Discharge: 2017-10-09 | Disposition: A | Discharge status: Home / Self Care | Payer: Medicare Other | Source: Ambulatory Visit | Attending: Internal Medicine | Admitting: Internal Medicine

## 2017-10-09 DIAGNOSIS — Z955 Presence of coronary angioplasty implant and graft: Secondary | ICD-10-CM

## 2017-10-09 DIAGNOSIS — Z48812 Encounter for surgical aftercare following surgery on the circulatory system: Secondary | ICD-10-CM | POA: Diagnosis not present

## 2017-10-09 LAB — GLUCOSE, CAPILLARY: Glucose-Capillary: 162 mg/dL — ABNORMAL HIGH (ref 65–99)

## 2017-10-09 NOTE — Telephone Encounter (Signed)
Please advise if pt is to continue medication

## 2017-10-14 ENCOUNTER — Encounter (HOSPITAL_COMMUNITY)
Admission: RE | Admit: 2017-10-14 | Discharge: 2017-10-14 | Disposition: A | Discharge status: Home / Self Care | Payer: Medicare Other | Source: Ambulatory Visit | Attending: Internal Medicine | Admitting: Internal Medicine

## 2017-10-14 DIAGNOSIS — Z955 Presence of coronary angioplasty implant and graft: Secondary | ICD-10-CM

## 2017-10-14 DIAGNOSIS — Z48812 Encounter for surgical aftercare following surgery on the circulatory system: Secondary | ICD-10-CM | POA: Diagnosis not present

## 2017-10-14 LAB — GLUCOSE, CAPILLARY: Glucose-Capillary: 226 mg/dL — ABNORMAL HIGH (ref 65–99)

## 2017-10-16 ENCOUNTER — Encounter (HOSPITAL_COMMUNITY)
Admission: RE | Admit: 2017-10-16 | Discharge: 2017-10-16 | Disposition: A | Discharge status: Home / Self Care | Payer: Medicare Other | Source: Ambulatory Visit | Attending: Internal Medicine | Admitting: Internal Medicine

## 2017-10-16 DIAGNOSIS — Z955 Presence of coronary angioplasty implant and graft: Secondary | ICD-10-CM

## 2017-10-16 DIAGNOSIS — Z48812 Encounter for surgical aftercare following surgery on the circulatory system: Secondary | ICD-10-CM | POA: Diagnosis not present

## 2017-10-16 LAB — GLUCOSE, CAPILLARY: GLUCOSE-CAPILLARY: 187 mg/dL — AB (ref 65–99)

## 2017-10-16 NOTE — Progress Notes (Signed)
Reviewed home exercise with pt today.  Pt plans to walk or do seated chair exercise for 2x/week in addition to coming to cardiac rehab.  Reviewed THR, pulse, RPE, sign and symptoms, NTG use, and when to call 911 or MD.  Also discussed weather considerations and indoor options.  Pt voiced understanding.    [employer], ACSM RCEP

## 2017-10-18 ENCOUNTER — Ambulatory Visit (HOSPITAL_COMMUNITY): Payer: Self-pay | Admitting: Cardiac Rehabilitation

## 2017-10-18 ENCOUNTER — Encounter (HOSPITAL_COMMUNITY): Payer: Medicare Other

## 2017-10-18 DIAGNOSIS — Z955 Presence of coronary angioplasty implant and graft: Secondary | ICD-10-CM

## 2017-10-18 NOTE — Progress Notes (Signed)
Cardiac Individual Treatment Plan  Patient Details  Name: Alicia [name] MRN: [id number] Date of Birth: [birth year] Referring Provider:     CARDIAC REHAB PHASE II ORIENTATION from 10/01/2017 in [place]  Referring Provider  [name] MD      Initial Encounter Date:    CARDIAC REHAB PHASE II ORIENTATION from 10/01/2017 in [place]  Date  10/01/17  Referring Provider  [name] MD      Visit Diagnosis: 03/18/17 DES D1  Patient's Home Medications on Admission:  Current Outpatient Medications:  .  acetaminophen (TYLENOL) 325 MG tablet, Take 2 tablets (650 mg total) by mouth every 4 (four) hours as needed for headache or mild pain., Disp: , Rfl:  .  aspirin 81 MG tablet, Take 81 mg by mouth daily., Disp: , Rfl:  .  atorvastatin (LIPITOR) 80 MG tablet, TAKE 1 TABLET BY MOUTH EVERY EVENING, Disp: 90 tablet, Rfl: 3 .  carvedilol (COREG) 25 MG tablet, Take 1 tablet (25 mg total) by mouth 2 (two) times daily., Disp: 180 tablet, Rfl: 3 .  clopidogrel (PLAVIX) 75 MG tablet, TAKE 1 TABLET (75 MG TOTAL) BY MOUTH DAILY., Disp: 90 tablet, Rfl: 2 .  DiphenhydrAMINE HCl, Sleep, (UNISOM SLEEPGELS PO), Take 1 tablet by mouth at bedtime as needed (for sleep). , Disp: , Rfl:  .  doxazosin (CARDURA) 4 MG tablet, TAKE 1 TABLET (4 MG TOTAL) BY MOUTH AT BEDTIME., Disp: 90 tablet, Rfl: 0 .  ferrous sulfate 325 (65 FE) MG tablet, TAKE 1 TABLET (325 MG TOTAL) BY MOUTH 3 (THREE) TIMES DAILY WITH MEALS., Disp: 90 tablet, Rfl: 3 .  glipiZIDE (GLUCOTROL) 10 MG tablet, TAKE 1 TABLET (10 MG TOTAL) BY MOUTH 2 (TWO) TIMES DAILY BEFORE A MEAL., Disp: 180 tablet, Rfl: 1 .  glucose blood (ONE TOUCH ULTRA TEST) test strip, 1 each by Other route 2 (two) times daily., Disp: 100 each, Rfl: 11 .  Insulin Pen Needle (B-D UF III MINI PEN NEEDLES) 31G X 5 MM MISC, 1 each by Other route 2 (two) times daily as needed., Disp: 100 each, Rfl: 1 .  isosorbide  mononitrate (IMDUR) 30 MG 24 hr tablet, Take 1 tablet (30 mg total) by mouth daily., Disp: 90 tablet, Rfl: 3 .  losartan (COZAAR) 100 MG tablet, TAKE 1 TABLET BY MOUTH EVERY DAY, Disp: 90 tablet, Rfl: 1 .  metFORMIN (GLUCOPHAGE) 1000 MG tablet, Take 1 tablet (1,000 mg total) by mouth 2 (two) times daily with a meal., Disp: 180 tablet, Rfl: 1 .  Multiple Vitamins-Minerals (MULTIVITAMIN WITH MINERALS) tablet, Take 1 tablet by mouth daily., Disp: , Rfl:  .  nitroGLYCERIN (NITROSTAT) 0.4 MG SL tablet, Place 1 tablet (0.4 mg total) under the tongue every 5 (five) minutes as needed for chest pain (up to 3 doses)., Disp: 25 tablet, Rfl: 3 .  sitaGLIPtin (JANUVIA) 100 MG tablet, Take 1 tablet (100 mg total) by mouth daily., Disp: 30 tablet, Rfl: 3 .  spironolactone (ALDACTONE) 25 MG tablet, TAKE 1 TABLET BY MOUTH EVERY DAY, Disp: 90 tablet, Rfl: 1 .  VICTOZA 18 MG/3ML SOPN, INJECT 0.2 MLS (1.2 MG TOTAL) INTO THE SKIN DAILY, Disp: 6 pen, Rfl: 0  Past Medical History: Past Medical History:  Diagnosis Date  . Anemia   . Arthritis    "right knee" (10/21/2015)  . CAD (coronary artery disease)    a. Cath 10/21/15: s/p DES to RCA: moderate diffuse stenosis of mod diagonal,  diffuse irregularity LCx and LAD. 2D echo 10/21/15: mod LVH, EF 60-65%, no RWMA, calcified MV, mod-severe LAE. 03/18/17 PCI with DES--> 1 diag  . Cataract   . Essential hypertension   . Family history of adverse reaction to anesthesia    "oldest sister, [name], related to brain formation at back of head; when they put her to sleep it's hard to wake her up"  . GERD (gastroesophageal reflux disease)   . History of blood transfusion    "related to ruptured tubal pregnancy"  . Hyperlipidemia   . Hypertensive heart disease   . Morbid obesity ([place])   . Type II diabetes mellitus ([facility])     Tobacco Use: Social History   Tobacco Use  Smoking Status Never Smoker  Smokeless Tobacco Never Used    Labs: Recent Review Flowsheet Data    Labs  for ITP Cardiac and Pulmonary Rehab Latest Ref Rng & Units 11/15/2016 02/26/2017 06/06/2017 07/14/2017 09/09/2017   Cholestrol 0 - 200 mg/dL 150 168 - 124 -   LDLCALC 0 - 99 mg/dL 80 101(H) - 77 -   HDL >40 mg/dL 42.70 43.30 - 36(L) -   Trlycerides <150 mg/dL 134.0 122.0 - 56 -   Hemoglobin A1c 4.6 - 6.5 % 15.3(H) 14.1(H) 8.1(H) - 6.7(H)   PHART 7.350 - 7.450 - - - - -   PCO2ART 35.0 - 45.0 mmHg - - - - -   HCO3 20.0 - 24.0 mEq/L - - - - -   TCO2 0 - 100 mmol/L - - - - -   O2SAT % - - - - -      Capillary Blood Glucose: Lab Results  Component Value Date   GLUCAP 187 (H) 10/16/2017   GLUCAP 226 (H) 10/14/2017   GLUCAP 162 (H) 10/09/2017   GLUCAP 119 (H) 10/07/2017   GLUCAP 251 (H) 10/07/2017     Exercise Target Goals:    Exercise Program Goal: Individual exercise prescription set with THRR, safety & activity barriers. Participant demonstrates ability to understand and report RPE using BORG scale, to self-measure pulse accurately, and to acknowledge the importance of the exercise prescription.  Exercise Prescription Goal: Starting with aerobic activity 30 plus minutes a day, 3 days per week for initial exercise prescription. Provide home exercise prescription and guidelines that participant acknowledges understanding prior to discharge.  Activity Barriers & Risk Stratification: Activity Barriers & Cardiac Risk Stratification - 10/01/17 0902      Activity Barriers & Cardiac Risk Stratification   Activity Barriers  Arthritis;Other (comment);Deconditioning;Muscular Weakness    Comments  Swelling in left leg and foot.    Cardiac Risk Stratification  High       6 Minute Walk: 6 Minute Walk    Row Name 10/01/17 1233 10/01/17 1250 10/01/17 1300     6 Minute Walk   Phase  Initial  -  -   Distance  731 feet  -  -   Walk Time  5 minutes  -  -   # of Rest Breaks  1  -  -   MPH  2.1  -  -   METS  1.3  -  -   RPE  13  -  -   Perceived Dyspnea   2  -  -   VO2 Peak  4.5  -  -    Symptoms  Yes (comment)  -  -   Comments  dysnpea, ftigue  dysnpea, fatigue  -   Resting HR  83 bpm  -  -   Resting BP  120/80  -  -   Resting Oxygen Saturation   99 % RA  -  -   Exercise Oxygen Saturation  during 6 min walk  99 % RA  -  -   Max Ex. HR  133 bpm  -  -   Max Ex. BP  130/80  -  -   2 Minute Post BP  -  -  118/82      Oxygen Initial Assessment:   Oxygen Re-Evaluation:   Oxygen Discharge (Final Oxygen Re-Evaluation):   Initial Exercise Prescription: Initial Exercise Prescription - 10/01/17 1200      Date of Initial Exercise RX and Referring Provider   Date  10/01/17    Referring Provider  [name] MD      Recumbant Bike   Level  1    Watts  10    Minutes  15    METs  1.7      NuStep   Level  2    SPM  80    Minutes  15    METs  1.8      Track   Laps  7    Minutes  15    METs  1.6      Prescription Details   Frequency (times per week)  3    Duration  Progress to 30 minutes of continuous aerobic without signs/symptoms of physical distress      Intensity   THRR 40-80% of Max Heartrate  60-120    Ratings of Perceived Exertion  11-13    Perceived Dyspnea  0-4      Progression   Progression  Continue to progress workloads to maintain intensity without signs/symptoms of physical distress.      Resistance Training   Training Prescription  Yes    Weight  2lbs    Reps  10-15       Perform Capillary Blood Glucose checks as needed.  Exercise Prescription Changes: Exercise Prescription Changes    Row Name 10/07/17 1500 10/16/17 1526           Response to Exercise   Blood Pressure (Admit)  123/62  124/80      Blood Pressure (Exercise)  140/80  118/68      Blood Pressure (Exit)  124/70  124/72      Heart Rate (Admit)  97 bpm  113 bpm      Heart Rate (Exercise)  117 bpm  141 bpm      Heart Rate (Exit)  89 bpm  93 bpm      Rating of Perceived Exertion (Exercise)  12  11      Symptoms  none  none      Comments  Pt was oriented to exercise  equipment 10/07/17  -      Duration  Continue with 30 min of aerobic exercise without signs/symptoms of physical distress.  Continue with 30 min of aerobic exercise without signs/symptoms of physical distress.      Intensity  THRR unchanged  THRR unchanged        Progression   Progression  Continue to progress workloads to maintain intensity without signs/symptoms of physical distress.  Continue to progress workloads to maintain intensity without signs/symptoms of physical distress.      Average METs  1.8  1.9        Resistance Training   Training Prescription  Yes  No  Weight  2lbs  -      Reps  10-15  -      Time  10 Minutes  -        Recumbant Bike   Level  1  2      Watts  10  -      Minutes  15  15      METs  1.6  2.1        NuStep   Level  2  -      SPM  85  -      Minutes  15  -      METs  2  -        Track   Laps  -  7      Minutes  -  15      METs  -  1.6        Home Exercise Plan   Plans to continue exercise at  -  Home (comment) Chair Exercises       Frequency  -  Add 2 additional days to program exercise sessions.      Initial Home Exercises Provided  -  10/16/17         Exercise Comments: Exercise Comments    Row Name 10/07/17 1548           Exercise Comments  Pt oriented to exercise equipment today. Pt respond to exercise prescription very well. Will continue to montior.           Exercise Goals and Review: Exercise Goals    Row Name 10/01/17 2956 10/01/17 1242           Exercise Goals   Increase Physical Activity  -  Yes      Intervention  -  Provide advice, education, support and counseling about physical activity/exercise needs.;Develop an individualized exercise prescription for aerobic and resistive training based on initial evaluation findings, risk stratification, comorbidities and participant's personal goals.      Expected Outcomes  -  Achievement of increased cardiorespiratory fitness and enhanced flexibility, muscular endurance  and strength shown through measurements of functional capacity and personal statement of participant.      Increase Strength and Stamina  - be active.   Yes      Intervention  -  Provide advice, education, support and counseling about physical activity/exercise needs.;Develop an individualized exercise prescription for aerobic and resistive training based on initial evaluation findings, risk stratification, comorbidities and participant's personal goals.      Expected Outcomes  -  Achievement of increased cardiorespiratory fitness and enhanced flexibility, muscular endurance and strength shown through measurements of functional capacity and personal statement of participant.      Able to understand and use rate of perceived exertion (RPE) scale  Yes  Yes      Intervention  Provide education and explanation on how to use RPE scale  Provide education and explanation on how to use RPE scale      Expected Outcomes  Short Term: Able to use RPE daily in rehab to express subjective intensity level;Long Term:  Able to use RPE to guide intensity level when exercising independently  Short Term: Able to use RPE daily in rehab to express subjective intensity level;Long Term:  Able to use RPE to guide intensity level when exercising independently      Knowledge and understanding of Target Heart Rate Range (THRR)  Yes  Yes      Intervention  Provide education and explanation of THRR including how the numbers were predicted and where they are located for reference  Provide education and explanation of THRR including how the numbers were predicted and where they are located for reference      Expected Outcomes  Short Term: Able to state/look up THRR;Long Term: Able to use THRR to govern intensity when exercising independently;Short Term: Able to use daily as guideline for intensity in rehab  Short Term: Able to state/look up THRR;Long Term: Able to use THRR to govern intensity when exercising independently;Short Term: Able  to use daily as guideline for intensity in rehab      Able to check pulse independently  Yes  Yes      Intervention  Provide education and demonstration on how to check pulse in carotid and radial arteries.;Review the importance of being able to check your own pulse for safety during independent exercise  Review the importance of being able to check your own pulse for safety during independent exercise;Provide education and demonstration on how to check pulse in carotid and radial arteries.      Expected Outcomes  Short Term: Able to explain why pulse checking is important during independent exercise;Long Term: Able to check pulse independently and accurately  Long Term: Able to check pulse independently and accurately;Short Term: Able to explain why pulse checking is important during independent exercise      Understanding of Exercise Prescription  Yes  Yes      Intervention  Provide education, explanation, and written materials on patient's individual exercise prescription  Provide education, explanation, and written materials on patient's individual exercise prescription      Expected Outcomes  Short Term: Able to explain program exercise prescription;Long Term: Able to explain home exercise prescription to exercise independently  Short Term: Able to explain program exercise prescription;Long Term: Able to explain home exercise prescription to exercise independently         Exercise Goals Re-Evaluation : Exercise Goals Re-Evaluation    Row Name 10/16/17 1555 10/17/17 1531           Exercise Goal Re-Evaluation   Exercise Goals Review  Increase Physical Activity;Able to understand and use rate of perceived exertion (RPE) scale;Knowledge and understanding of Target Heart Rate Range (THRR);Understanding of Exercise Prescription;Increase Strength and Stamina;Able to check pulse independently  Increase Physical Activity;Able to understand and use rate of perceived exertion (RPE) scale;Knowledge and  understanding of Target Heart Rate Range (THRR);Understanding of Exercise Prescription;Increase Strength and Stamina;Able to check pulse independently      Comments  Reviewed home exercise with pt today.  Pt plans to walk or do seated chair exercise for 2x/week in addition to coming to cardiac rehab.  Reviewed THR, pulse, RPE, sign and symptoms, NTG use, and when to call 911 or MD.  Also discussed weather considerations and indoor options.  Pt voiced understanding.  Reviewed home exercise with pt today.  Pt plans to walk or do seated chair exercise for 2x/week in addition to coming to cardiac rehab.  Reviewed THR, pulse, RPE, sign and symptoms, NTG use, and when to call 911 or MD.  Also discussed weather considerations and indoor options.  Pt voiced understanding.      Expected Outcomes  Pt will improve in cardiorespiratory fitness and functional capacity.  Pt will improve in cardiorespiratory fitness and functional capacity.          Discharge Exercise Prescription (Final Exercise Prescription Changes): Exercise Prescription Changes - 10/16/17 1526  Response to Exercise   Blood Pressure (Admit)  124/80    Blood Pressure (Exercise)  118/68    Blood Pressure (Exit)  124/72    Heart Rate (Admit)  113 bpm    Heart Rate (Exercise)  141 bpm    Heart Rate (Exit)  93 bpm    Rating of Perceived Exertion (Exercise)  11    Symptoms  none    Duration  Continue with 30 min of aerobic exercise without signs/symptoms of physical distress.    Intensity  THRR unchanged      Progression   Progression  Continue to progress workloads to maintain intensity without signs/symptoms of physical distress.    Average METs  1.9      Resistance Training   Training Prescription  No      Recumbant Bike   Level  2    Minutes  15    METs  2.1      Track   Laps  7    Minutes  15    METs  1.6      Home Exercise Plan   Plans to continue exercise at  Home (comment) Chair Exercises     Frequency  Add 2  additional days to program exercise sessions.    Initial Home Exercises Provided  10/16/17       Nutrition:  Target Goals: Understanding of nutrition guidelines, daily intake of sodium 1500mg , cholesterol 200mg , calories 30% from fat and 7% or less from saturated fats, daily to have 5 or more servings of fruits and vegetables.  Biometrics: Pre Biometrics - 10/01/17 1243      Pre Biometrics   Height  5' 5.5" (1.664 m)    Weight  305 lb 1.9 oz (138.4 kg)  (Abnormal)     Waist Circumference  48 inches    Hip Circumference  60 inches    Waist to Hip Ratio  0.8 %    BMI (Calculated)  49.98    Triceps Skinfold  70 mm    % Body Fat  60.6 %    Grip Strength  23 kg    Flexibility  12 in    Single Leg Stand  2 seconds        Nutrition Therapy Plan and Nutrition Goals: Nutrition Therapy & Goals - 10/01/17 1144      Nutrition Therapy   Diet  Carb Modified, Therapeutic Lifestyle Changes      Personal Nutrition Goals   Nutrition Goal  Pt to identify and limit food sources of saturated fat, trans fat, and sodium    Personal Goal #2  Pt to identify food quantities necessary to achieve weight loss of 6-20 lb at graduation from cardiac rehab.      Intervention Plan   Intervention  Prescribe, educate and counsel regarding individualized specific dietary modifications aiming towards targeted core components such as weight, hypertension, lipid management, diabetes, heart failure and other comorbidities.    Expected Outcomes  Long Term Goal: Adherence to prescribed nutrition plan.;Short Term Goal: Understand basic principles of dietary content, such as calories, fat, sodium, cholesterol and nutrients.       Nutrition Discharge: Nutrition Scores: Nutrition Assessments - 10/01/17 1145      MEDFICTS Scores   Pre Score  38    Post Score  46    Score Difference  8       Nutrition Goals Re-Evaluation:   Nutrition Goals Re-Evaluation:   Nutrition Goals Discharge (Final Nutrition Goals  Re-Evaluation):  Psychosocial: Target Goals: Acknowledge presence or absence of significant depression and/or stress, maximize coping skills, provide positive support system. Participant is able to verbalize types and ability to use techniques and skills needed for reducing stress and depression.  Initial Review & Psychosocial Screening: Initial Psych Review & Screening - 10/01/17 1259      Initial Review   Current issues with  Current Stress Concerns    Source of Stress Concerns  Chronic Illness;Family    Comments  caregiver fatigue, progression of CAD      Family Dynamics   Good Support System?  Yes Sisters, strong faith base      Barriers   Psychosocial barriers to participate in program  The patient should benefit from training in stress management and relaxation.      Screening Interventions   Interventions  Encouraged to exercise;Provide feedback about the scores to participant       Quality of Life Scores: Quality of Life - 10/01/17 1251      Quality of Life Scores   Health/Function Pre  21.33 %    Socioeconomic Pre  25.21 %    Psych/Spiritual Pre  25.07 %    Family Pre  22.7 %    GLOBAL Pre  23.1 %       PHQ-9: Recent Review Flowsheet Data    Depression screen [facility] 2/9 04/30/2017 10/16/2016 03/16/2016 11/23/2015 10/10/2015   Decreased Interest 0 0 0 0 0   Down, Depressed, Hopeless 1  0 0 0 0   PHQ - 2 Score 1 0 0 0 0     Interpretation of Total Score  Total Score Depression Severity:  1-4 = Minimal depression, 5-9 = Mild depression, 10-14 = Moderate depression, 15-19 = Moderately severe depression, 20-27 = Severe depression   Psychosocial Evaluation and Intervention: Psychosocial Evaluation - 10/07/17 1634      Psychosocial Evaluation & Interventions   Interventions  Stress management education;Relaxation education;Encouraged to exercise with the program and follow exercise prescription    Comments  pt has caregiver fatigue and family stress with family  staying in her home.      Expected Outcomes  pt will exhibit positive outlook with good coping skills.     Continue Psychosocial Services   No Follow up required       Psychosocial Re-Evaluation: Psychosocial Re-Evaluation    Lewis Name 10/15/17 1724             Psychosocial Re-Evaluation   Current issues with  Current Stress Concerns;History of Depression       Comments  pt exhibits caregiver stress and fatigue. pt encouraged to participate in self care activities.   pt making positive interactions with her peers and classmates        Expected Outcomes  pt will demonstrate positive outlook with good coping skills and self care managment.        Interventions  Encouraged to attend Cardiac Rehabilitation for the exercise;Stress management education;Relaxation education       Continue Psychosocial Services   Follow up required by staff          Psychosocial Discharge (Final Psychosocial Re-Evaluation): Psychosocial Re-Evaluation - 10/15/17 1724      Psychosocial Re-Evaluation   Current issues with  Current Stress Concerns;History of Depression    Comments  pt exhibits caregiver stress and fatigue. pt encouraged to participate in self care activities.   pt making positive interactions with her peers and classmates     Expected Outcomes  pt  will demonstrate positive outlook with good coping skills and self care managment.     Interventions  Encouraged to attend Cardiac Rehabilitation for the exercise;Stress management education;Relaxation education    Continue Psychosocial Services   Follow up required by staff       Vocational Rehabilitation: Provide vocational rehab assistance to qualifying candidates.   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 Participant will state understanding/return demonstration of topics presented.  Learning Barriers/Preferences: Learning Barriers/Preferences - 10/01/17 0902      Learning Barriers/Preferences   Learning Barriers  Sight    Learning Preferences  Written Material       Education Topics: Count Your Pulse:  -Group instruction provided by verbal instruction, demonstration, patient participation and written materials to support subject.  Instructors address importance of being able to find your pulse and how to count your pulse when at home without a heart monitor.  Patients get hands on experience counting their pulse with staff help and individually.   Heart Attack, Angina, and Risk Factor Modification:  -Group instruction provided by verbal instruction, video, and written materials to support subject.  Instructors address signs and symptoms of angina and heart attacks.    Also discuss risk factors for heart disease and how to make changes to improve heart health risk factors.   Functional Fitness:  -Group instruction provided by verbal instruction, demonstration, patient participation, and written materials to support subject.  Instructors address safety measures for doing things around the house.  Discuss how to get up and down off the floor, how to pick things up properly, how to safely get out of a chair without assistance, and balance training.   CARDIAC REHAB PHASE II EXERCISE from 07/05/2017 in [place]  Date  07/05/17  Instruction Review Code  2- meets goals/outcomes      Meditation and Mindfulness:  -Group instruction provided by verbal instruction, patient participation, and written materials to support subject.  Instructor addresses importance of mindfulness and meditation practice to help reduce stress and improve awareness.  Instructor also leads participants through a meditation exercise.     Stretching for Flexibility and Mobility:  -Group instruction provided by verbal instruction, patient participation, and written materials to support subject.  Instructors lead participants through series of stretches that are designed to increase flexibility thus improving mobility.  These stretches are additional exercise for major muscle groups that are typically performed during regular warm up and cool down.   Hands Only CPR:  -Group verbal, video, and participation provides a basic overview of AHA guidelines for community CPR. Role-play of emergencies allow participants the opportunity to practice calling for help and chest compression technique with discussion of AED use.   Hypertension: -Group verbal and written instruction that provides a basic overview of hypertension including the most recent diagnostic guidelines, risk factor reduction with self-care instructions and medication management.    Nutrition I class: Heart Healthy Eating:  -Group instruction provided by PowerPoint slides, verbal discussion, and written materials to support subject matter. The instructor gives an explanation and review of the Therapeutic Lifestyle Changes diet recommendations, which includes a discussion on lipid goals, dietary fat, sodium, fiber, plant stanol/sterol esters, sugar, and the components  of a well-balanced, healthy diet.   Nutrition II class: Lifestyle Skills:  -Group instruction provided by PowerPoint slides, verbal discussion, and written materials to support subject matter. The instructor gives an explanation and review of label reading, grocery shopping for heart health, heart healthy recipe modifications, and ways to make healthier choices when eating out.   Diabetes Question & Answer:  -Group instruction provided by PowerPoint slides, verbal discussion, and written materials to support subject matter. The instructor gives an explanation and review of diabetes co-morbidities, pre-  and post-prandial blood glucose goals, pre-exercise blood glucose goals, signs, symptoms, and treatment of hypoglycemia and hyperglycemia, and foot care basics.   CARDIAC REHAB PHASE II EXERCISE from 07/05/2017 in [place]  Date  05/31/17  Educator  RD  Instruction Review Code  2- meets goals/outcomes      Diabetes Blitz:  -Group instruction provided by PowerPoint slides, verbal discussion, and written materials to support subject matter. The instructor gives an explanation and review of the physiology behind type 1 and type 2 diabetes, diabetes medications and rational behind using different medications, pre- and post-prandial blood glucose recommendations and Hemoglobin A1c goals, diabetes diet, and exercise including blood glucose guidelines for exercising safely.    Portion Distortion:  -Group instruction provided by PowerPoint slides, verbal discussion, written materials, and food models to support subject matter. The instructor gives an explanation of serving size versus portion size, changes in portions sizes over the last 20 years, and what consists of a serving from each food group.   CARDIAC REHAB PHASE II EXERCISE from 07/05/2017 in [place]  Date  05/08/17  Educator  RD  Instruction Review Code  2- meets goals/outcomes      Stress Management:  -Group instruction provided by verbal instruction, video, and written materials to support subject matter.  Instructors review role of stress in heart disease and how to cope with stress positively.     CARDIAC REHAB PHASE II EXERCISE from 07/05/2017 in [place]  Date  05/15/17  Instruction Review Code  2- meets goals/outcomes      Exercising on Your Own:  -Group instruction provided by verbal instruction, power point, and written materials to support subject.  Instructors discuss benefits of exercise, components of exercise, frequency  and intensity of exercise, and end points for exercise.  Also discuss use of nitroglycerin and activating EMS.  Review options of places to exercise outside of rehab.  Review guidelines for sex with heart disease.   Cardiac Drugs I:  -Group instruction provided by verbal instruction and written materials to support subject.  Instructor reviews cardiac drug classes: antiplatelets, anticoagulants, beta blockers, and statins.  Instructor discusses reasons, side effects, and lifestyle considerations for each drug class.   Cardiac Drugs II:  -Group instruction provided by verbal instruction and written materials to support subject.  Instructor reviews cardiac drug classes: angiotensin converting enzyme inhibitors (ACE-I), angiotensin II receptor blockers (ARBs), nitrates, and calcium channel blockers.  Instructor discusses reasons, side effects, and lifestyle considerations for each drug class.   CARDIAC REHAB PHASE II EXERCISE from 07/05/2017 in [place]  Date  05/01/17  Educator  Pharm D  Instruction Review Code  2- meets goals/outcomes      Anatomy and Physiology of the Circulatory System:  Group verbal and written instruction and models provide basic cardiac anatomy and physiology, with the coronary electrical and arterial systems. Review of: AMI,  Angina, Valve disease, Heart Failure, Peripheral Artery Disease, Cardiac Arrhythmia, Pacemakers, and the ICD.   CARDIAC REHAB PHASE II EXERCISE from 07/05/2017 in [place]  Date  06/19/17  Instruction Review Code  2- meets goals/outcomes      Other Education:  -Group or individual verbal, written, or video instructions that support the educational goals of the cardiac rehab program.   Knowledge Questionnaire Score: Knowledge Questionnaire Score - 10/01/17 1258      Knowledge Questionnaire Score   Pre Score  21/24        Core Components/Risk Factors/Patient Goals at  Admission: Personal Goals and Risk Factors at Admission - 10/01/17 1244      Core Components/Risk Factors/Patient Goals on Admission    Weight Management  Yes;Obesity;Weight Maintenance;Weight Loss    Intervention  Weight Management: Develop a combined nutrition and exercise program designed to reach desired caloric intake, while maintaining appropriate intake of nutrient and fiber, sodium and fats, and appropriate energy expenditure required for the weight goal.;Weight Management: Provide education and appropriate resources to help participant work on and attain dietary goals.;Obesity: Provide education and appropriate resources to help participant work on and attain dietary goals.;Weight Management/Obesity: Establish reasonable short term and long term weight goals.    Admit Weight  305 lb 1.9 oz (138.4 kg)    Goal Weight: Short Term  300 lb (136.1 kg)    Goal Weight: Long Term  290 lb (131.5 kg)    Expected Outcomes  Short Term: Continue to assess and modify interventions until short term weight is achieved;Long Term: Adherence to nutrition and physical activity/exercise program aimed toward attainment of established weight goal;Weight Maintenance: Understanding of the daily nutrition guidelines, which includes 25-35% calories from fat, 7% or less cal from saturated fats, less than 200mg  cholesterol, less than 1.5gm of sodium, & 5 or more servings of fruits and vegetables daily;Weight Loss: Understanding of general recommendations for a balanced deficit meal plan, which promotes 1-2 lb weight loss per week and includes a negative energy balance of [phone] kcal/d;Understanding recommendations for meals to include 15-35% energy as protein, 25-35% energy from fat, 35-60% energy from carbohydrates, less than 200mg  of dietary cholesterol, 20-35 gm of total fiber daily;Understanding of distribution of calorie intake throughout the day with the consumption of 4-5 meals/snacks    Improve shortness of breath  with ADL's  Yes    Intervention  Provide education, individualized exercise plan and daily activity instruction to help decrease symptoms of SOB with activities of daily living.    Expected Outcomes  Short Term: Achieves a reduction of symptoms when performing activities of daily living.    Diabetes  Yes    Intervention  Provide education about signs/symptoms and action to take for hypo/hyperglycemia.;Provide education about proper nutrition, including hydration, and aerobic/resistive exercise prescription along with prescribed medications to achieve blood glucose in normal ranges: Fasting glucose 65-99 mg/dL    Expected Outcomes  Short Term: Participant verbalizes understanding of the signs/symptoms and immediate care of hyper/hypoglycemia, proper foot care and importance of medication, aerobic/resistive exercise and nutrition plan for blood glucose control.    Hypertension  Yes    Intervention  Provide education on lifestyle modifcations including regular physical activity/exercise, weight management, moderate sodium restriction and increased consumption of fresh fruit, vegetables, and low fat dairy, alcohol moderation, and smoking cessation.;Monitor prescription use compliance.    Expected Outcomes  Short Term: Continued assessment and intervention until BP is < 140/94mm HG in hypertensive participants. <  130/103mm HG in hypertensive participants with diabetes, heart failure or chronic kidney disease.;Long Term: Maintenance of blood pressure at goal levels.    Lipids  Yes    Intervention  Provide education and support for participant on nutrition & aerobic/resistive exercise along with prescribed medications to achieve LDL 70mg , HDL >40mg .    Expected Outcomes  Short Term: Participant states understanding of desired cholesterol values and is compliant with medications prescribed. Participant is following exercise prescription and nutrition guidelines.;Long Term: Cholesterol controlled with medications  as prescribed, with individualized exercise RX and with personalized nutrition plan. Value goals: LDL < 70mg , HDL > 40 mg.    Stress  Yes    Intervention  Offer individual and/or small group education and counseling on adjustment to heart disease, stress management and health-related lifestyle change. Teach and support self-help strategies.;Refer participants experiencing significant psychosocial distress to appropriate mental health specialists for further evaluation and treatment. When possible, include family members and significant others in education/counseling sessions.    Expected Outcomes  Short Term: Participant demonstrates changes in health-related behavior, relaxation and other stress management skills, ability to obtain effective social support, and compliance with psychotropic medications if prescribed.;Long Term: Emotional wellbeing is indicated by absence of clinically significant psychosocial distress or social isolation.       Core Components/Risk Factors/Patient Goals Review:  Goals and Risk Factor Review    Row Name 10/15/17 1720             Core Components/Risk Factors/Patient Goals Review   Personal Goals Review  Weight Management/Obesity;Diabetes;Hypertension;Stress       Review  pt resumed CR program.  pt demonstrates eagerness to participate in CR activities.         Expected Outcomes  pt will continue CR exercise,  nutrition and lifestyle modifications to decrease overall risk factors.           Core Components/Risk Factors/Patient Goals at Discharge (Final Review):  Goals and Risk Factor Review - 10/15/17 1720      Core Components/Risk Factors/Patient Goals Review   Personal Goals Review  Weight Management/Obesity;Diabetes;Hypertension;Stress    Review  pt resumed CR program.  pt demonstrates eagerness to participate in CR activities.      Expected Outcomes  pt will continue CR exercise,  nutrition and lifestyle modifications to decrease overall risk factors.         ITP Comments: ITP Comments    Row Name 10/01/17 0855 10/07/17 1630 10/15/17 1720       ITP Comments  Dr. Fransico Him, Medical Director   pt resumed group exercise session without difficulty.   30 day ITP review.  pt with good attendance and participation.         Comments:

## 2017-10-21 ENCOUNTER — Encounter (HOSPITAL_COMMUNITY): Payer: Medicare Other

## 2017-10-23 ENCOUNTER — Encounter (HOSPITAL_COMMUNITY)
Admission: RE | Admit: 2017-10-23 | Discharge: 2017-10-23 | Disposition: A | Discharge status: Home / Self Care | Payer: Medicare Other | Source: Ambulatory Visit | Attending: Internal Medicine | Admitting: Internal Medicine

## 2017-10-23 DIAGNOSIS — Z48812 Encounter for surgical aftercare following surgery on the circulatory system: Secondary | ICD-10-CM | POA: Insufficient documentation

## 2017-10-23 DIAGNOSIS — N183 Chronic kidney disease, stage 3 (moderate): Secondary | ICD-10-CM | POA: Insufficient documentation

## 2017-10-23 DIAGNOSIS — Z955 Presence of coronary angioplasty implant and graft: Secondary | ICD-10-CM | POA: Diagnosis present

## 2017-10-23 DIAGNOSIS — D631 Anemia in chronic kidney disease: Secondary | ICD-10-CM | POA: Insufficient documentation

## 2017-10-23 DIAGNOSIS — E7800 Pure hypercholesterolemia, unspecified: Secondary | ICD-10-CM | POA: Diagnosis not present

## 2017-10-23 DIAGNOSIS — Z7984 Long term (current) use of oral hypoglycemic drugs: Secondary | ICD-10-CM | POA: Insufficient documentation

## 2017-10-23 DIAGNOSIS — Z7982 Long term (current) use of aspirin: Secondary | ICD-10-CM | POA: Insufficient documentation

## 2017-10-23 DIAGNOSIS — Z79899 Other long term (current) drug therapy: Secondary | ICD-10-CM | POA: Diagnosis not present

## 2017-10-23 DIAGNOSIS — I2510 Atherosclerotic heart disease of native coronary artery without angina pectoris: Secondary | ICD-10-CM | POA: Diagnosis not present

## 2017-10-23 DIAGNOSIS — E1122 Type 2 diabetes mellitus with diabetic chronic kidney disease: Secondary | ICD-10-CM | POA: Diagnosis not present

## 2017-10-23 DIAGNOSIS — I129 Hypertensive chronic kidney disease with stage 1 through stage 4 chronic kidney disease, or unspecified chronic kidney disease: Secondary | ICD-10-CM | POA: Insufficient documentation

## 2017-10-23 NOTE — Progress Notes (Signed)
Alicia [name] 71 y.o. female DOB: 01-29-1946 MRN: [id number]      Nutrition  1. 03/18/17 DES D1    Meds reviewed. Glipizide, Metformin, Januvia, Victoza noted Note Spoke with pt. Nutrition plan and survey reviewed with pt. Pt is following Step 1 of the Therapeutic Lifestyle Changes diet. Pt wants to lose wt. Pt has so far maintained her wt during rehab. Pt is diabetic. Last A1c indicates blood glucose well-controlled.Pt checks CBG's 3-4 times a day. Pt was having hypoglycemic episodes twice daily. Per discussion, pt MD changed the timing of her DM medications to prevent frequent hypoglycemic episodes. Pt feels changing DM medication times has helped prevent hypoglycemia. Per pt, lowest CBG recently has been a fasting CBG of 68 mg/dL. Pt expressed understanding of the information reviewed. Pt aware of nutrition education classes offered. Pt reports she has nutrition class handouts from previous admission.   Nutrition Diagnosis ? Food-and nutrition-related knowledge deficit related to lack of exposure to information as related to diagnosis of: ? CVD ? DM  ? Obesity related to excessive energy intake as evidenced by a BMI of 51.1  Nutrition Intervention ? Pt's individual nutrition plan and goals reviewed with pt. ? Benefits of adopting Heart Healthy diet discussed when [employer] reviewed. Nutrition Goal(s):  ? Pt to identify and limit food sources of saturated fat, trans fat, and sodium ? Pt to identify food quantities necessary to achieve weight loss of 6-20 lb at graduation from cardiac rehab.  Plan:  Pt to attend nutrition classes ? Portion Distortion - met 05/08/17 ? Diabetes Q & A - met 05/31/17 Will provide client-centered nutrition education as part of interdisciplinary care.   Monitor and evaluate progress toward nutrition goal with team.  Derek Mound, M.Ed, RD, LDN, CDE 10/23/2017 2:51 PM

## 2017-10-25 ENCOUNTER — Encounter (HOSPITAL_COMMUNITY)
Admission: RE | Admit: 2017-10-25 | Discharge: 2017-10-25 | Disposition: A | Discharge status: Home / Self Care | Payer: Medicare Other | Source: Ambulatory Visit | Attending: Internal Medicine | Admitting: Internal Medicine

## 2017-10-25 DIAGNOSIS — Z955 Presence of coronary angioplasty implant and graft: Secondary | ICD-10-CM

## 2017-10-25 DIAGNOSIS — Z48812 Encounter for surgical aftercare following surgery on the circulatory system: Secondary | ICD-10-CM | POA: Diagnosis not present

## 2017-10-28 ENCOUNTER — Encounter: Payer: Medicare Other | Admitting: Internal Medicine

## 2017-11-04 ENCOUNTER — Encounter (HOSPITAL_COMMUNITY)
Admission: RE | Admit: 2017-11-04 | Discharge: 2017-11-04 | Disposition: A | Discharge status: Home / Self Care | Payer: Medicare Other | Source: Ambulatory Visit | Attending: Internal Medicine | Admitting: Internal Medicine

## 2017-11-04 VITALS — Ht 65.5 in | Wt 307.1 lb

## 2017-11-04 DIAGNOSIS — Z955 Presence of coronary angioplasty implant and graft: Secondary | ICD-10-CM

## 2017-11-04 DIAGNOSIS — Z48812 Encounter for surgical aftercare following surgery on the circulatory system: Secondary | ICD-10-CM | POA: Diagnosis not present

## 2017-11-04 LAB — GLUCOSE, CAPILLARY
Glucose-Capillary: 100 mg/dL — ABNORMAL HIGH (ref 65–99)
Glucose-Capillary: 107 mg/dL — ABNORMAL HIGH (ref 65–99)

## 2017-11-21 ENCOUNTER — Other Ambulatory Visit (HOSPITAL_COMMUNITY): Payer: Self-pay

## 2017-11-21 NOTE — Discharge Instructions (Signed)

## 2017-11-22 ENCOUNTER — Ambulatory Visit (HOSPITAL_COMMUNITY)
Admission: RE | Admit: 2017-11-22 | Discharge: 2017-11-22 | Disposition: A | Discharge status: Home / Self Care | Payer: Medicare Other | Source: Ambulatory Visit | Attending: Nephrology | Admitting: Nephrology

## 2017-11-22 DIAGNOSIS — D631 Anemia in chronic kidney disease: Secondary | ICD-10-CM | POA: Diagnosis not present

## 2017-11-22 DIAGNOSIS — N189 Chronic kidney disease, unspecified: Secondary | ICD-10-CM | POA: Insufficient documentation

## 2017-11-22 MED ORDER — SODIUM CHLORIDE 0.9 % IV SOLN
510.0000 mg | INTRAVENOUS | Status: DC
Start: 2017-11-22 — End: 2017-11-23
  Administered 2017-11-22: 510 mg via INTRAVENOUS
  Filled 2017-11-22: qty 17

## 2017-11-24 ENCOUNTER — Emergency Department (HOSPITAL_COMMUNITY): Payer: Medicare Other

## 2017-11-24 ENCOUNTER — Encounter (HOSPITAL_COMMUNITY): Payer: Self-pay | Admitting: Emergency Medicine

## 2017-11-24 ENCOUNTER — Other Ambulatory Visit: Payer: Self-pay

## 2017-11-24 ENCOUNTER — Emergency Department (HOSPITAL_COMMUNITY)
Admission: EM | Admit: 2017-11-24 | Discharge: 2017-11-24 | Disposition: A | Discharge status: Home / Self Care | Payer: Medicare Other | Attending: Emergency Medicine | Admitting: Emergency Medicine

## 2017-11-24 ENCOUNTER — Emergency Department (HOSPITAL_BASED_OUTPATIENT_CLINIC_OR_DEPARTMENT_OTHER)
Admit: 2017-11-24 | Discharge: 2017-11-24 | Disposition: A | Discharge status: Home / Self Care | Payer: Medicare Other | Attending: Emergency Medicine | Admitting: Emergency Medicine

## 2017-11-24 DIAGNOSIS — I1310 Hypertensive heart and chronic kidney disease without heart failure, with stage 1 through stage 4 chronic kidney disease, or unspecified chronic kidney disease: Secondary | ICD-10-CM | POA: Insufficient documentation

## 2017-11-24 DIAGNOSIS — Z882 Allergy status to sulfonamides status: Secondary | ICD-10-CM | POA: Insufficient documentation

## 2017-11-24 DIAGNOSIS — Z91041 Radiographic dye allergy status: Secondary | ICD-10-CM | POA: Diagnosis not present

## 2017-11-24 DIAGNOSIS — E119 Type 2 diabetes mellitus without complications: Secondary | ICD-10-CM | POA: Diagnosis not present

## 2017-11-24 DIAGNOSIS — Z79899 Other long term (current) drug therapy: Secondary | ICD-10-CM | POA: Insufficient documentation

## 2017-11-24 DIAGNOSIS — Z7984 Long term (current) use of oral hypoglycemic drugs: Secondary | ICD-10-CM | POA: Diagnosis not present

## 2017-11-24 DIAGNOSIS — K4190 Unilateral femoral hernia, without obstruction or gangrene, not specified as recurrent: Secondary | ICD-10-CM | POA: Diagnosis not present

## 2017-11-24 DIAGNOSIS — M79609 Pain in unspecified limb: Secondary | ICD-10-CM

## 2017-11-24 DIAGNOSIS — Z794 Long term (current) use of insulin: Secondary | ICD-10-CM | POA: Insufficient documentation

## 2017-11-24 DIAGNOSIS — M79651 Pain in right thigh: Secondary | ICD-10-CM | POA: Diagnosis present

## 2017-11-24 DIAGNOSIS — Z7982 Long term (current) use of aspirin: Secondary | ICD-10-CM | POA: Insufficient documentation

## 2017-11-24 DIAGNOSIS — N183 Chronic kidney disease, stage 3 (moderate): Secondary | ICD-10-CM | POA: Insufficient documentation

## 2017-11-24 DIAGNOSIS — M79604 Pain in right leg: Secondary | ICD-10-CM

## 2017-11-24 LAB — BASIC METABOLIC PANEL
Anion gap: 7 (ref 5–15)
BUN: 20 mg/dL (ref 6–20)
CALCIUM: 9.2 mg/dL (ref 8.9–10.3)
CHLORIDE: 105 mmol/L (ref 101–111)
CO2: 26 mmol/L (ref 22–32)
CREATININE: 1.5 mg/dL — AB (ref 0.44–1.00)
GFR calc Af Amer: 40 mL/min — ABNORMAL LOW (ref >60)
GFR calc non Af Amer: 34 mL/min — ABNORMAL LOW (ref >60)
GLUCOSE: 44 mg/dL — AB (ref 65–99)
Potassium: 4.4 mmol/L (ref 3.5–5.1)
Sodium: 138 mmol/L (ref 135–145)

## 2017-11-24 LAB — CBC WITH DIFFERENTIAL/PLATELET
BASOS PCT: 0 %
Basophils Absolute: 0 10*3/uL (ref 0.0–0.1)
EOS PCT: 2 %
Eosinophils Absolute: 0.1 10*3/uL (ref 0.0–0.7)
HEMATOCRIT: 34 % — AB (ref 36.0–46.0)
HEMOGLOBIN: 10 g/dL — AB (ref 12.0–15.0)
Lymphocytes Relative: 40 %
Lymphs Abs: 2.2 10*3/uL (ref 0.7–4.0)
MCH: 21.5 pg — ABNORMAL LOW (ref 26.0–34.0)
MCHC: 29.4 g/dL — AB (ref 30.0–36.0)
MCV: 73.1 fL — AB (ref 78.0–100.0)
MONO ABS: 0.3 10*3/uL (ref 0.1–1.0)
MONOS PCT: 5 %
NEUTROS ABS: 2.9 10*3/uL (ref 1.7–7.7)
Neutrophils Relative %: 53 %
Platelets: 234 10*3/uL (ref 150–400)
RBC: 4.65 MIL/uL (ref 3.87–5.11)
RDW: 15.4 % (ref 11.5–15.5)
WBC: 5.5 10*3/uL (ref 4.0–10.5)

## 2017-11-24 LAB — CBG MONITORING, ED
GLUCOSE-CAPILLARY: 33 mg/dL — AB (ref 65–99)
Glucose-Capillary: 42 mg/dL — CL (ref 65–99)
Glucose-Capillary: 136 mg/dL — ABNORMAL HIGH (ref 65–99)

## 2017-11-24 LAB — D-DIMER, QUANTITATIVE: D-Dimer, Quant: 1.57 ug/mL-FEU — ABNORMAL HIGH (ref 0.00–0.50)

## 2017-11-24 MED ORDER — TRAMADOL HCL 50 MG PO TABS: 50.0000 mg | ORAL_TABLET | Freq: Four times a day (QID) | ORAL | 0 refills | Status: DC | PRN

## 2017-11-24 MED ORDER — DEXTROSE 50 % IV SOLN
50.0000 mL | Freq: Once | INTRAVENOUS | Status: AC
  Administered 2017-11-24: 50 mL via INTRAVENOUS
  Filled 2017-11-24: qty 50

## 2017-11-24 NOTE — Progress Notes (Signed)
VASCULAR LAB PRELIMINARY  PRELIMINARY  PRELIMINARY  PRELIMINARY  Right lower extremity venous duplex completed.    Preliminary report:  There is no DVT or SVT noted in the right lower extremity.  Gave results to Dr. Zettie Cooley, [facility], RVT 11/24/2017, 6:51 PM

## 2017-11-24 NOTE — ED Notes (Signed)
Dr Jeneen Rinks aware of critical glucose of 44, CBG in process now

## 2017-11-24 NOTE — ED Notes (Signed)
CBG: 42... RN and MD notified

## 2017-11-24 NOTE — ED Notes (Signed)
Attempted blood draw x2 unsuccessful

## 2017-11-24 NOTE — ED Notes (Signed)
CBG: 33.....MD notified...[name]8oz Apple Juice given per MD

## 2017-11-24 NOTE — ED Notes (Signed)
vascular at bedside at this time

## 2017-11-24 NOTE — ED Notes (Signed)
Patient transported to X-ray 

## 2017-11-24 NOTE — Discharge Instructions (Signed)
If your symptoms persist, follow-up with either your primary care physician, or [facility] [place] [facility] for further testing to determine if you have a femoral hernia causing your leg pain.

## 2017-11-24 NOTE — ED Notes (Signed)
Pt and family member understood dc material. NAD noted. Script given at [employer]

## 2017-11-24 NOTE — ED Provider Notes (Signed)
[place] EMERGENCY DEPARTMENT Provider Note   CSN: [id number] Arrival date & time: 11/24/17  1202     History   Chief Complaint Chief Complaint  Patient presents with  . Leg Pain    HPI Alicia [name] is a 72 y.o. female. Chief complaint is right anterior lateral thigh pain  HPI:  72 year old black female. Morbidly obese. Today is Sunday. Thursday night she states she awakened with pain in her right anterior thigh distally. No extends from her anterior groin to her anterior thigh. Does not go to the knee. No pain medially, posteriorly. Normal feeling. Does not affect her strength or walking.  No history of peripheral vascular disease or DVT.  Past Medical History:  Diagnosis Date  . Anemia   . Arthritis    "right knee" (10/21/2015)  . CAD (coronary artery disease)    a. Cath 10/21/15: s/p DES to RCA: moderate diffuse stenosis of mod diagonal, diffuse irregularity LCx and LAD. 2D echo 10/21/15: mod LVH, EF 60-65%, no RWMA, calcified MV, mod-severe LAE. 03/18/17 PCI with DES--> 1 diag  . Cataract   . Essential hypertension   . Family history of adverse reaction to anesthesia    "oldest sister, [name], related to brain formation at back of head; when they put her to sleep it's hard to wake her up"  . GERD (gastroesophageal reflux disease)   . History of blood transfusion    "related to ruptured tubal pregnancy"  . Hyperlipidemia   . Hypertensive heart disease   . Morbid obesity ([place])   . Type II diabetes mellitus [facility])     Patient Active Problem List   Diagnosis Date Noted  . Progressive angina ([place])   . CKD (chronic kidney disease) stage 3, GFR 30-59 ml/min (HCC) 03/15/2017  . CAD S/P percutaneous coronary angioplasty 10/22/2015  . Essential hypertension 10/22/2015  . Microcytic anemia 10/22/2015  . Morbid obesity ([place]) 10/22/2015  . Hyperlipidemia 10/09/2012  . GERD (gastroesophageal reflux disease) 10/09/2012  . Adenomatous colon polyp 09/24/2012    . DM (diabetes mellitus), type 2, uncontrolled ([place]) 10/12/2011  . Gastric bypass status for obesity 10/12/2011    Past Surgical History:  Procedure Laterality Date  . ABDOMINAL HYSTERECTOMY  1988  . APPENDECTOMY  1985  . [place] SURGERY  1984  . CARDIAC CATHETERIZATION N/A 10/21/2015   Procedure: Right/Left Heart Cath and Coronary Angiography;  Surgeon: [name], MD;  Location: [place] CV LAB;  Service: Cardiovascular;  Laterality: N/A;  . CHOLECYSTECTOMY OPEN  1984  . CORONARY STENT INTERVENTION N/A 03/18/2017   Procedure: Coronary Stent Intervention;  Surgeon: [name], MD;  Location: [place] CV LAB;  Service: Cardiovascular;  Laterality: N/A;  . CORONARY STENT PLACEMENT  03/18/2017   A OPTIMIZE STUDY Drug Eluting Stent (2.5 mm 18 mm - post-dilated to 2.7 mm) was successfully placed  . ECTOPIC PREGNANCY SURGERY    . LEFT HEART CATH AND CORONARY ANGIOGRAPHY N/A 03/18/2017   Procedure: Left Heart Cath and Coronary Angiography;  Surgeon: [name], MD;  Location: [place] CV LAB;  Service: Cardiovascular;  Laterality: N/A;  . REDUCTION MAMMAPLASTY Bilateral ~ 1976  . TONSILLECTOMY      OB History    No data available       Home Medications    Prior to Admission medications   Medication Sig Start Date End Date Taking? Authorizing Provider  acetaminophen (TYLENOL) 325 MG tablet Take 2 tablets (650 mg total) by mouth every  4 (four) hours as needed for headache or mild pain. 03/19/17   Erlene Quan, PA-C  aspirin 81 MG tablet Take 81 mg by mouth daily.    [provider]  atorvastatin (LIPITOR) 80 MG tablet TAKE 1 TABLET BY MOUTH EVERY EVENING 08/13/17   Dunn, Dayna N, PA-C  carvedilol (COREG) 25 MG tablet Take 1 tablet (25 mg total) by mouth 2 (two) times daily. 04/16/17   [name], MD  clopidogrel (PLAVIX) 75 MG tablet TAKE 1 TABLET (75 MG TOTAL) BY MOUTH DAILY. 09/06/17   [name], MD  DiphenhydrAMINE HCl, Sleep, (UNISOM SLEEPGELS  PO) Take 1 tablet by mouth at bedtime as needed (for sleep).     [provider]  doxazosin (CARDURA) 4 MG tablet TAKE 1 TABLET (4 MG TOTAL) BY MOUTH AT BEDTIME. 09/09/17   Jearld Fenton, NP  ferrous sulfate 325 (65 FE) MG tablet TAKE 1 TABLET (325 MG TOTAL) BY MOUTH 3 (THREE) TIMES DAILY WITH MEALS. 10/09/17   Baity, Coralie Keens, NP  glipiZIDE (GLUCOTROL) 10 MG tablet TAKE 1 TABLET (10 MG TOTAL) BY MOUTH 2 (TWO) TIMES DAILY BEFORE A MEAL. 08/29/17   Jearld Fenton, NP  glucose blood (ONE TOUCH ULTRA TEST) test strip 1 each by Other route 2 (two) times daily. 07/31/17   Jearld Fenton, NP  Insulin Pen Needle (B-D UF III MINI PEN NEEDLES) 31G X 5 MM MISC 1 each by Other route 2 (two) times daily as needed. 09/06/17   Jearld Fenton, NP  isosorbide mononitrate (IMDUR) 30 MG 24 hr tablet Take 1 tablet (30 mg total) by mouth daily. 03/15/17 09/26/17  Richardson Dopp T, PA-C  losartan (COZAAR) 100 MG tablet TAKE 1 TABLET BY MOUTH EVERY DAY 08/29/17   Jearld Fenton, NP  metFORMIN (GLUCOPHAGE) 1000 MG tablet Take 1 tablet (1,000 mg total) by mouth 2 (two) times daily with a meal. 03/21/17   Kilroy, Doreene Burke, PA-C  Multiple Vitamins-Minerals (MULTIVITAMIN WITH MINERALS) tablet Take 1 tablet by mouth daily.    [provider]  nitroGLYCERIN (NITROSTAT) 0.4 MG SL tablet Place 1 tablet (0.4 mg total) under the tongue every 5 (five) minutes as needed for chest pain (up to 3 doses). 10/22/15   Dunn, Nedra Hai, PA-C  sitaGLIPtin (JANUVIA) 100 MG tablet Take 1 tablet (100 mg total) by mouth daily. 07/23/17   Jearld Fenton, NP  spironolactone (ALDACTONE) 25 MG tablet TAKE 1 TABLET BY MOUTH EVERY DAY 08/29/17   Jearld Fenton, NP  traMADol (ULTRAM) 50 MG tablet Take 1 tablet (50 mg total) by mouth every 6 (six) hours as needed. 11/24/17   Tanna Furry, MD  VICTOZA 18 MG/3ML SOPN INJECT 0.2 MLS (1.2 MG TOTAL) INTO THE SKIN DAILY 10/02/17   Jearld Fenton, NP    Family History Family History  Problem  Relation Age of Onset  . Diabetes Mother   . Heart attack Mother        2007  . Hypertension Mother   . Hyperlipidemia Mother   . Heart disease Mother   . Heart attack Father   . Stroke Father   . Hypertension Father   . Hyperlipidemia Father   . Stomach cancer Sister   . Diabetes Sister   . Diabetes Sister   . Diabetes Sister     Social History Social History   Tobacco Use  . Smoking status: Never Smoker  . Smokeless tobacco: Never Used  Substance Use Topics  .  Alcohol use: No  . Drug use: No     Allergies   Iodinated diagnostic agents and Sulfa antibiotics   Review of Systems Review of Systems  Constitutional: Negative for appetite change, chills, diaphoresis, fatigue and fever.  HENT: Negative for mouth sores, sore throat and trouble swallowing.   Eyes: Negative for visual disturbance.  Respiratory: Negative for cough, chest tightness, shortness of breath and wheezing.   Cardiovascular: Negative for chest pain.  Gastrointestinal: Negative for abdominal distention, abdominal pain, diarrhea, nausea and vomiting.  Endocrine: Negative for polydipsia, polyphagia and polyuria.  Genitourinary: Negative for dysuria, frequency and hematuria.  Musculoskeletal: Negative for gait problem.       Right thigh pain  Skin: Negative for color change, pallor and rash.  Neurological: Negative for dizziness, syncope, light-headedness and headaches.  Hematological: Does not bruise/bleed easily.  Psychiatric/Behavioral: Negative for behavioral problems and confusion.     Physical Exam Updated Vital Signs BP (!) 157/93 (BP Location: Left Arm)   Pulse 73   Temp 99.3 F (37.4 C) (Oral)   Resp 14   Ht 5' 5.5" (1.664 m)   Wt (!) 139.7 kg (308 lb)   SpO2 98%   BMI 50.47 kg/m   Physical Exam  Constitutional: She is oriented to person, place, and time. She appears well-developed and well-nourished. No distress.  HENT:  Head: Normocephalic.  Eyes: Conjunctivae are normal.  Pupils are equal, round, and reactive to light. No scleral icterus.  Neck: Normal range of motion. Neck supple. No thyromegaly present.  Cardiovascular: Normal rate and regular rhythm. Exam reveals no gallop and no friction rub.  No murmur heard. Pulmonary/Chest: Effort normal and breath sounds normal. No respiratory distress. She has no wheezes. She has no rales.  Abdominal: Soft. Bowel sounds are normal. She exhibits no distension. There is no tenderness. There is no rebound.  Musculoskeletal: Normal range of motion.       Legs: Neurological: She is alert and oriented to person, place, and time.  Skin: Skin is warm and dry. No rash noted.  Psychiatric: She has a normal mood and affect. Her behavior is normal.  2 small areas of ecchymosis on the right anterior thigh. Approximate size of a thumb print. These are nontender. She declines any injury. No subcutaneous air. No rashes suggest zoster or dermatomal pattern to her pain   ED Treatments / Results  Labs (all labs ordered are listed, but only abnormal results are displayed) Labs Reviewed  CBC WITH DIFFERENTIAL/PLATELET - Abnormal; Notable for the following components:      Result Value   Hemoglobin 10.0 (*)    HCT 34.0 (*)    MCV 73.1 (*)    MCH 21.5 (*)    MCHC 29.4 (*)    All other components within normal limits  BASIC METABOLIC PANEL - Abnormal; Notable for the following components:   Glucose, Bld 44 (*)    Creatinine, Ser 1.50 (*)    GFR calc non Af Amer 34 (*)    GFR calc Af Amer 40 (*)    All other components within normal limits  D-DIMER, QUANTITATIVE (NOT AT [facility]) - Abnormal; Notable for the following components:   D-Dimer, Quant 1.57 (*)    All other components within normal limits  CBG MONITORING, ED - Abnormal; Notable for the following components:   Glucose-Capillary 33 (*)    All other components within normal limits  CBG MONITORING, ED - Abnormal; Notable for the following components:   Glucose-Capillary 42  (*)  All other components within normal limits    EKG  EKG Interpretation None       Radiology Dg Femur Min 2 Views Right  Result Date: 11/24/2017 CLINICAL DATA:  Patient reports an onset of right mid and upper femur pain, reports no injury. EXAM: RIGHT FEMUR 2 VIEWS COMPARISON:  None. FINDINGS: There is no evidence of fracture or other focal bone lesions. Soft tissues are unremarkable. There are degenerative changes of the mean. IMPRESSION: No evidence for acute  abnormality. Electronically Signed   By: Nolon Nations M.D.   On: 11/24/2017 17:18    Procedures Procedures (including critical care time)  Medications Ordered in ED Medications  dextrose 50 % solution 50 mL (50 mLs Intravenous Given 11/24/17 1816)     Initial Impression / Assessment and Plan / ED Course  I have reviewed the triage vital signs and the nursing notes.  Pertinent labs & imaging results that were available during my care of the patient were reviewed by me and considered in my medical decision making (see chart for details).    Doubt infection, fasciitis, DVT. Has normal peripheral sensation pulses and cap refill. Plan screening labs. If negative plan will be pain control. Primary care or surgical follow-up for possible femoral hernia  Final Clinical Impressions(s) / ED Diagnoses   Final diagnoses:  Right leg pain  Unilateral femoral hernia without obstruction or gangrene, recurrence not specified    Elevated age-adjusted d-dimer but negative Doppler. Normal x-ray. No gas. No finding on x-ray or exam to suggest infection or fasciitis. No leukocytosis or fever. Distribution is concerning for Albatross nerve/femoral hernia. Surgical referral. Tramadol. Primary care follow-up.  ED Discharge Orders        Ordered    traMADol (ULTRAM) 50 MG tablet  Every 6 hours PRN     11/24/17 Suzanna Obey, MD 11/24/17 1849

## 2017-11-24 NOTE — ED Triage Notes (Signed)
Pt. Stated, I started having thigh pain that started on Thursday , no injury or fall , just all of sudden started. Unable to sleep.

## 2017-11-24 NOTE — ED Notes (Signed)
ED Provider at bedside. 

## 2017-11-29 ENCOUNTER — Ambulatory Visit (HOSPITAL_COMMUNITY)
Admission: RE | Admit: 2017-11-29 | Discharge: 2017-11-29 | Disposition: A | Discharge status: Home / Self Care | Payer: Medicare Other | Source: Ambulatory Visit | Attending: Nephrology | Admitting: Nephrology

## 2017-11-29 DIAGNOSIS — N189 Chronic kidney disease, unspecified: Secondary | ICD-10-CM | POA: Insufficient documentation

## 2017-11-29 DIAGNOSIS — D631 Anemia in chronic kidney disease: Secondary | ICD-10-CM | POA: Insufficient documentation

## 2017-11-29 MED ORDER — FERUMOXYTOL INJECTION 510 MG/17 ML
510.0000 mg | INTRAVENOUS | Status: DC
  Administered 2017-11-29: 510 mg via INTRAVENOUS
  Filled 2017-11-29: qty 17

## 2017-12-01 ENCOUNTER — Other Ambulatory Visit: Payer: Self-pay | Admitting: Internal Medicine

## 2017-12-01 DIAGNOSIS — E119 Type 2 diabetes mellitus without complications: Secondary | ICD-10-CM

## 2017-12-02 NOTE — Addendum Note (Signed)
Encounter addended by: Jewel Baize, RD on: 12/02/2017 10:31 AM  Actions taken: Flowsheet data copied forward, Visit Navigator Flowsheet section accepted

## 2017-12-09 MED ORDER — SITAGLIPTIN PHOSPHATE 100 MG PO TABS: 100.0000 mg | ORAL_TABLET | Freq: Every day | ORAL | 0 refills | Status: DC

## 2017-12-09 NOTE — Addendum Note (Signed)
Addended by: Lurlean Nanny on: 12/09/2017 03:37 PM   Modules accepted: Orders

## 2017-12-10 NOTE — Addendum Note (Signed)
Encounter addended by: Lowell Guitar, RN on: 12/10/2017 11:00 AM  Actions taken: Episode resolved

## 2017-12-10 NOTE — Addendum Note (Signed)
Encounter addended by: Lowell Guitar, RN on: 12/10/2017 10:56 AM  Actions taken: Sign clinical note

## 2017-12-10 NOTE — Progress Notes (Addendum)
Discharge Progress Report  Patient Details  Name: Alicia [name] MRN: [id number] Date of Birth: 1946/08/05 Referring Provider:     CARDIAC REHAB PHASE II ORIENTATION from 10/01/2017 in [place]  Referring Provider  [name] MD       Number of Visits: 36   Reason for Discharge:  Patient reached a stable level of exercise.  Smoking History:  Social History   Tobacco Use  Smoking Status Never Smoker  Smokeless Tobacco Never Used    Diagnosis:  03/18/17 DES D1  ADL [facility]:   Initial Exercise Prescription: Initial Exercise Prescription - 10/01/17 1200      Date of Initial Exercise RX and Referring Provider   Date  10/01/17    Referring Provider  [name] MD      Recumbant Bike   Level  1    Watts  10    Minutes  15    METs  1.7      NuStep   Level  2    SPM  80    Minutes  15    METs  1.8      Track   Laps  7    Minutes  15    METs  1.6      Prescription Details   Frequency (times per week)  3    Duration  Progress to 30 minutes of continuous aerobic without signs/symptoms of physical distress      Intensity   THRR 40-80% of Max Heartrate  60-120    Ratings of Perceived Exertion  11-13    Perceived Dyspnea  0-4      Progression   Progression  Continue to progress workloads to maintain intensity without signs/symptoms of physical distress.      Resistance Training   Training Prescription  Yes    Weight  2lbs    Reps  10-15       Discharge Exercise Prescription (Final Exercise Prescription Changes): Exercise Prescription Changes - 11/04/17 1500      Response to Exercise   Blood Pressure (Admit)  150/80    Blood Pressure (Exercise)  154/70    Blood Pressure (Exit)  134/78    Heart Rate (Admit)  95 bpm    Heart Rate (Exercise)  136 bpm    Heart Rate (Exit)  95 bpm    Rating of Perceived Exertion (Exercise)  12    Comments  pt did post 6 minute walk test and measurements as an exercise station    Duration   Continue with 30 min of aerobic exercise without signs/symptoms of physical distress.    Intensity  THRR unchanged      Progression   Progression  Continue to progress workloads to maintain intensity without signs/symptoms of physical distress.    Average METs  2      Resistance Training   Training Prescription  Yes    Weight  2lbs    Reps  10-15    Time  10 Minutes      NuStep   Level  4    SPM  90    Minutes  20    METs  2.1      Track   Laps  6    Minutes  6    METs  2      Home Exercise Plan   Plans to continue exercise at  Home (comment) chair exercises    Frequency  Add 2 additional  days to program exercise sessions.    Initial Home Exercises Provided  10/16/17       Functional Capacity: 6 Minute Walk    Row Name 10/01/17 1233 10/01/17 1250 10/01/17 1300     6 Minute Walk   Phase  Initial  -  -   Distance  731 feet  -  -   Walk Time  5 minutes  -  -   # of Rest Breaks  1  -  -   MPH  2.1  -  -   METS  1.3  -  -   RPE  13  -  -   Perceived Dyspnea   2  -  -   VO2 Peak  4.5  -  -   Symptoms  Yes (comment)  -  -   Comments  dysnpea, ftigue  dysnpea, fatigue  -   Resting HR  83 bpm  -  -   Resting BP  120/80  -  -   Resting Oxygen Saturation   99 % RA  -  -   Exercise Oxygen Saturation  during 6 min walk  99 % RA  -  -   Max Ex. HR  133 bpm  -  -   Max Ex. BP  130/80  -  -   2 Minute Post BP  -  -  118/82   Row Name 11/04/17 1506         6 Minute Walk   Phase  Discharge     Distance  1200 feet     Distance % Change  28.89 %     Distance Feet Change  269 ft     Walk Time  5.15 minutes     # of Rest Breaks  1     MPH  2.65     METS  2     RPE  12     Perceived Dyspnea   1     VO2 Peak  7.1     Symptoms  Yes (comment)     Comments  dyspnea, fatigue     Resting HR  95 bpm     Resting BP  150/80     Max Ex. HR  136 bpm     Max Ex. BP  154/70     2 Minute Post BP  134/78        Psychological, QOL, Others - Outcomes: PHQ 2/9: Depression  screen [facility] 2/9 11/04/2017 04/30/2017 10/16/2016 03/16/2016 11/23/2015  Decreased Interest 0 0 0 0 0  Down, Depressed, Hopeless 1 1 0 0 0  PHQ - 2 Score 1 1 0 0 0    Quality of Life: Quality of Life - 11/22/17 1657      Quality of Life Scores   Health/Function Post  -- pt did not complete post QOL survey       Personal Goals: Goals established at orientation with interventions provided to work toward goal. Personal Goals and Risk Factors at Admission - 10/01/17 1244      Core Components/Risk Factors/Patient Goals on Admission    Weight Management  Yes;Obesity;Weight Maintenance;Weight Loss    Intervention  Weight Management: Develop a combined nutrition and exercise program designed to reach desired caloric intake, while maintaining appropriate intake of nutrient and fiber, sodium and fats, and appropriate energy expenditure required for the weight goal.;Weight Management: Provide education and appropriate resources to help participant work on and attain dietary goals.;Obesity:  Provide education and appropriate resources to help participant work on and attain dietary goals.;Weight Management/Obesity: Establish reasonable short term and long term weight goals.    Admit Weight  305 lb 1.9 oz (138.4 kg)    Goal Weight: Short Term  300 lb (136.1 kg)    Goal Weight: Long Term  290 lb (131.5 kg)    Expected Outcomes  Short Term: Continue to assess and modify interventions until short term weight is achieved;Long Term: Adherence to nutrition and physical activity/exercise program aimed toward attainment of established weight goal;Weight Maintenance: Understanding of the daily nutrition guidelines, which includes 25-35% calories from fat, 7% or less cal from saturated fats, less than 200mg  cholesterol, less than 1.5gm of sodium, & 5 or more servings of fruits and vegetables daily;Weight Loss: Understanding of general recommendations for a balanced deficit meal plan, which promotes 1-2 lb weight loss per  week and includes a negative energy balance of [phone] kcal/d;Understanding recommendations for meals to include 15-35% energy as protein, 25-35% energy from fat, 35-60% energy from carbohydrates, less than 200mg  of dietary cholesterol, 20-35 gm of total fiber daily;Understanding of distribution of calorie intake throughout the day with the consumption of 4-5 meals/snacks    Improve shortness of breath with ADL's  Yes    Intervention  Provide education, individualized exercise plan and daily activity instruction to help decrease symptoms of SOB with activities of daily living.    Expected Outcomes  Short Term: Achieves a reduction of symptoms when performing activities of daily living.    Diabetes  Yes    Intervention  Provide education about signs/symptoms and action to take for hypo/hyperglycemia.;Provide education about proper nutrition, including hydration, and aerobic/resistive exercise prescription along with prescribed medications to achieve blood glucose in normal ranges: Fasting glucose 65-99 mg/dL    Expected Outcomes  Short Term: Participant verbalizes understanding of the signs/symptoms and immediate care of hyper/hypoglycemia, proper foot care and importance of medication, aerobic/resistive exercise and nutrition plan for blood glucose control.    Hypertension  Yes    Intervention  Provide education on lifestyle modifcations including regular physical activity/exercise, weight management, moderate sodium restriction and increased consumption of fresh fruit, vegetables, and low fat dairy, alcohol moderation, and smoking cessation.;Monitor prescription use compliance.    Expected Outcomes  Short Term: Continued assessment and intervention until BP is < 140/20mm HG in hypertensive participants. < 130/21mm HG in hypertensive participants with diabetes, heart failure or chronic kidney disease.;Long Term: Maintenance of blood pressure at goal levels.    Lipids  Yes    Intervention  Provide  education and support for participant on nutrition & aerobic/resistive exercise along with prescribed medications to achieve LDL 70mg , HDL >40mg .    Expected Outcomes  Short Term: Participant states understanding of desired cholesterol values and is compliant with medications prescribed. Participant is following exercise prescription and nutrition guidelines.;Long Term: Cholesterol controlled with medications as prescribed, with individualized exercise RX and with personalized nutrition plan. Value goals: LDL < 70mg , HDL > 40 mg.    Stress  Yes    Intervention  Offer individual and/or small group education and counseling on adjustment to heart disease, stress management and health-related lifestyle change. Teach and support self-help strategies.;Refer participants experiencing significant psychosocial distress to appropriate mental health specialists for further evaluation and treatment. When possible, include family members and significant others in education/counseling sessions.    Expected Outcomes  Short Term: Participant demonstrates changes in health-related behavior, relaxation and other stress management skills, ability to obtain  effective social support, and compliance with psychotropic medications if prescribed.;Long Term: Emotional wellbeing is indicated by absence of clinically significant psychosocial distress or social isolation.        Personal Goals Discharge: Goals and Risk Factor Review    Row Name 07/04/17 1053 08/20/17 1105 10/15/17 1720 11/04/17 1553       Core Components/Risk Factors/Patient Goals Review   Personal Goals Review  Weight Management/Obesity;Diabetes;Hypertension;Stress  Weight Management/Obesity;Diabetes;Hypertension;Stress  Weight Management/Obesity;Diabetes;Hypertension;Stress  Weight Management/Obesity;Diabetes;Hypertension;Stress  (Pended)     Review  pt with multiple risk factors for CAD displays eagerness to participate in CR group exercise program.  pt is  pleased with her increased ability to perform household activities. pt demonstrates commitment to participate in CR group exercise classes. .   pt exited program early due to medical hospitalization with home health RN/PT upon discharge.  pt interested in resuming CR when cleared medically.    pt resumed CR program.  pt demonstrates eagerness to participate in CR activities.    -    Expected Outcomes  pt will participate in CR exercise, nutrition, and lifestyle education classes to decrease overall risk factors.   pt will continue physical activity, nutrition and lifestyle modifications to decrease overall risk factors.   pt will continue CR exercise,  nutrition and lifestyle modifications to decrease overall risk factors.   -       Exercise Goals and Review: Exercise Goals    Row Name 10/01/17 4270 10/01/17 1242           Exercise Goals   Increase Physical Activity  -  Yes      Intervention  -  Provide advice, education, support and counseling about physical activity/exercise needs.;Develop an individualized exercise prescription for aerobic and resistive training based on initial evaluation findings, risk stratification, comorbidities and participant's personal goals.      Expected Outcomes  -  Achievement of increased cardiorespiratory fitness and enhanced flexibility, muscular endurance and strength shown through measurements of functional capacity and personal statement of participant.      Increase Strength and Stamina  - be active.   Yes      Intervention  -  Provide advice, education, support and counseling about physical activity/exercise needs.;Develop an individualized exercise prescription for aerobic and resistive training based on initial evaluation findings, risk stratification, comorbidities and participant's personal goals.      Expected Outcomes  -  Achievement of increased cardiorespiratory fitness and enhanced flexibility, muscular endurance and strength shown through  measurements of functional capacity and personal statement of participant.      Able to understand and use rate of perceived exertion (RPE) scale  Yes  Yes      Intervention  Provide education and explanation on how to use RPE scale  Provide education and explanation on how to use RPE scale      Expected Outcomes  Short Term: Able to use RPE daily in rehab to express subjective intensity level;Long Term:  Able to use RPE to guide intensity level when exercising independently  Short Term: Able to use RPE daily in rehab to express subjective intensity level;Long Term:  Able to use RPE to guide intensity level when exercising independently      Knowledge and understanding of Target Heart Rate Range (THRR)  Yes  Yes      Intervention  Provide education and explanation of THRR including how the numbers were predicted and where they are located for reference  Provide education and explanation of THRR including  how the numbers were predicted and where they are located for reference      Expected Outcomes  Short Term: Able to state/look up THRR;Long Term: Able to use THRR to govern intensity when exercising independently;Short Term: Able to use daily as guideline for intensity in rehab  Short Term: Able to state/look up THRR;Long Term: Able to use THRR to govern intensity when exercising independently;Short Term: Able to use daily as guideline for intensity in rehab      Able to check pulse independently  Yes  Yes      Intervention  Provide education and demonstration on how to check pulse in carotid and radial arteries.;Review the importance of being able to check your own pulse for safety during independent exercise  Review the importance of being able to check your own pulse for safety during independent exercise;Provide education and demonstration on how to check pulse in carotid and radial arteries.      Expected Outcomes  Short Term: Able to explain why pulse checking is important during independent  exercise;Long Term: Able to check pulse independently and accurately  Long Term: Able to check pulse independently and accurately;Short Term: Able to explain why pulse checking is important during independent exercise      Understanding of Exercise Prescription  Yes  Yes      Intervention  Provide education, explanation, and written materials on patient's individual exercise prescription  Provide education, explanation, and written materials on patient's individual exercise prescription      Expected Outcomes  Short Term: Able to explain program exercise prescription;Long Term: Able to explain home exercise prescription to exercise independently  Short Term: Able to explain program exercise prescription;Long Term: Able to explain home exercise prescription to exercise independently         Nutrition & Weight - Outcomes: Pre Biometrics - 10/01/17 1243      Pre Biometrics   Height  5' 5.5" (1.664 m)    Weight  305 lb 1.9 oz (138.4 kg)  (Abnormal)     Waist Circumference  48 inches    Hip Circumference  60 inches    Waist to Hip Ratio  0.8 %    BMI (Calculated)  49.98    Triceps Skinfold  70 mm    % Body Fat  60.6 %    Grip Strength  23 kg    Flexibility  12 in    Single Leg Stand  2 seconds      Post Biometrics - 11/04/17 1512       Post  Biometrics   Height  5' 5.5" (1.664 m)    Weight  307 lb 1.6 oz (139.3 kg)  (Abnormal)     Waist Circumference  49 inches    Hip Circumference  61 inches    Waist to Hip Ratio  0.8 %    BMI (Calculated)  50.31    Triceps Skinfold  70 mm    % Body Fat  61.1 %    Grip Strength  27 kg    Flexibility  15 in    Single Leg Stand  12 seconds       Nutrition: Nutrition Therapy & Goals - 10/01/17 1144      Nutrition Therapy   Diet  Carb Modified, Therapeutic Lifestyle Changes      Personal Nutrition Goals   Nutrition Goal  Pt to identify and limit food sources of saturated fat, trans fat, and sodium    Personal Goal #2  Pt  to identify food  quantities necessary to achieve weight loss of 6-20 lb at graduation from cardiac rehab.      Intervention Plan   Intervention  Prescribe, educate and counsel regarding individualized specific dietary modifications aiming towards targeted core components such as weight, hypertension, lipid management, diabetes, heart failure and other comorbidities.    Expected Outcomes  Long Term Goal: Adherence to prescribed nutrition plan.;Short Term Goal: Understand basic principles of dietary content, such as calories, fat, sodium, cholesterol and nutrients.       Nutrition Discharge: Nutrition Assessments - 12/02/17 1029      MEDFICTS Scores   Pre Score  46    Post Score  24    Score Difference  -22       Education Questionnaire Score: Knowledge Questionnaire Score - 11/04/17 1513      Knowledge Questionnaire Score   Post Score  22/24       Goals reviewed with patient; copy given to patient.

## 2017-12-15 ENCOUNTER — Other Ambulatory Visit: Payer: Self-pay | Admitting: Surgery

## 2017-12-15 DIAGNOSIS — M79659 Pain in unspecified thigh: Secondary | ICD-10-CM

## 2017-12-18 ENCOUNTER — Ambulatory Visit: Payer: Self-pay | Admitting: Internal Medicine

## 2017-12-19 ENCOUNTER — Other Ambulatory Visit: Payer: Self-pay

## 2017-12-19 MED ORDER — ONETOUCH DELICA LANCETS 33G MISC: 1.0000 | Freq: Two times a day (BID) | 2 refills | Status: DC

## 2017-12-20 ENCOUNTER — Ambulatory Visit (INDEPENDENT_AMBULATORY_CARE_PROVIDER_SITE_OTHER): Payer: Medicare Other | Admitting: Internal Medicine

## 2017-12-20 ENCOUNTER — Encounter: Payer: Self-pay | Admitting: Internal Medicine

## 2017-12-20 VITALS — BP 146/94 | HR 78 | Temp 97.9°F | Ht 65.0 in | Wt 303.0 lb

## 2017-12-20 DIAGNOSIS — Z1159 Encounter for screening for other viral diseases: Secondary | ICD-10-CM | POA: Diagnosis not present

## 2017-12-20 DIAGNOSIS — E119 Type 2 diabetes mellitus without complications: Secondary | ICD-10-CM

## 2017-12-20 DIAGNOSIS — Z9861 Coronary angioplasty status: Secondary | ICD-10-CM

## 2017-12-20 DIAGNOSIS — Z0000 Encounter for general adult medical examination without abnormal findings: Secondary | ICD-10-CM

## 2017-12-20 DIAGNOSIS — K2100 Gastro-esophageal reflux disease with esophagitis, without bleeding: Secondary | ICD-10-CM

## 2017-12-20 DIAGNOSIS — I10 Essential (primary) hypertension: Secondary | ICD-10-CM | POA: Diagnosis not present

## 2017-12-20 DIAGNOSIS — M170 Bilateral primary osteoarthritis of knee: Secondary | ICD-10-CM | POA: Diagnosis not present

## 2017-12-20 DIAGNOSIS — I2510 Atherosclerotic heart disease of native coronary artery without angina pectoris: Secondary | ICD-10-CM

## 2017-12-20 DIAGNOSIS — E559 Vitamin D deficiency, unspecified: Secondary | ICD-10-CM | POA: Diagnosis not present

## 2017-12-20 DIAGNOSIS — E7800 Pure hypercholesterolemia, unspecified: Secondary | ICD-10-CM

## 2017-12-20 DIAGNOSIS — M179 Osteoarthritis of knee, unspecified: Secondary | ICD-10-CM | POA: Insufficient documentation

## 2017-12-20 DIAGNOSIS — M1710 Unilateral primary osteoarthritis, unspecified knee: Secondary | ICD-10-CM | POA: Insufficient documentation

## 2017-12-20 LAB — HEMOGLOBIN A1C: Hgb A1c MFr Bld: 6.6 % — ABNORMAL HIGH (ref 4.6–6.5)

## 2017-12-20 LAB — CBC
HEMATOCRIT: 33.2 % — AB (ref 36.0–46.0)
HEMOGLOBIN: 10.5 g/dL — AB (ref 12.0–15.0)
MCHC: 31.8 g/dL (ref 30.0–36.0)
MCV: 71.4 fl — ABNORMAL LOW (ref 78.0–100.0)
Platelets: 234 10*3/uL (ref 150.0–400.0)
RBC: 4.65 Mil/uL (ref 3.87–5.11)
RDW: 16.6 % — AB (ref 11.5–15.5)
WBC: 4.1 10*3/uL (ref 4.0–10.5)

## 2017-12-20 LAB — COMPREHENSIVE METABOLIC PANEL
ALK PHOS: 95 U/L (ref 39–117)
ALT: 15 U/L (ref 0–35)
AST: 18 U/L (ref 0–37)
Albumin: 3.8 g/dL (ref 3.5–5.2)
BUN: 18 mg/dL (ref 6–23)
CHLORIDE: 104 meq/L (ref 96–112)
CO2: 29 meq/L (ref 19–32)
Calcium: 9.1 mg/dL (ref 8.4–10.5)
Creatinine, Ser: 1.4 mg/dL — ABNORMAL HIGH (ref 0.40–1.20)
GFR: 47.78 mL/min — AB (ref >60.00)
GLUCOSE: 64 mg/dL — AB (ref 70–99)
Potassium: 4.3 mEq/L (ref 3.5–5.1)
SODIUM: 140 meq/L (ref 135–145)
Total Bilirubin: 0.5 mg/dL (ref 0.2–1.2)
Total Protein: 6.7 g/dL (ref 6.0–8.3)

## 2017-12-20 LAB — LIPID PANEL
CHOL/HDL RATIO: 4
Cholesterol: 126 mg/dL (ref 0–200)
HDL: 33.8 mg/dL — AB (ref >39.00)
LDL Cholesterol: 82 mg/dL (ref 0–99)
NONHDL: 92.48
Triglycerides: 50 mg/dL (ref 0.0–149.0)
VLDL: 10 mg/dL (ref 0.0–40.0)

## 2017-12-20 LAB — VITAMIN D 25 HYDROXY (VIT D DEFICIENCY, FRACTURES): VITD: 47.62 ng/mL (ref 30.00–100.00)

## 2017-12-20 MED ORDER — TRAMADOL HCL 50 MG PO TABS: 50.0000 mg | ORAL_TABLET | Freq: Two times a day (BID) | ORAL | 0 refills | Status: DC | PRN

## 2017-12-20 NOTE — Assessment & Plan Note (Signed)
No recent angina On Atorvastatin, ASA, Plavix and Carvedilol She will continue to follow with cardiology CBC, CMET and Lipid profile today

## 2017-12-20 NOTE — Assessment & Plan Note (Signed)
Currently not an issue Will monitor 

## 2017-12-20 NOTE — Assessment & Plan Note (Signed)
A1C today No microalbumin secondary to ARB therapy Encouraged her to consume a low carb diet and exercise for weight loss Encouraged yearly eye exams Foot exam today Continue current medications Immunizations UTD

## 2017-12-20 NOTE — Patient Instructions (Signed)
Health Maintenance for Postmenopausal Women Menopause is a normal process in which your reproductive ability comes to an end. This process happens gradually over a span of months to years, usually between the ages of 22 and 9. Menopause is complete when you have missed 12 consecutive menstrual periods. It is important to talk with your health care provider about some of the most common conditions that affect postmenopausal women, such as heart disease, cancer, and bone loss (osteoporosis). Adopting a healthy lifestyle and getting preventive care can help to promote your health and wellness. Those actions can also lower your chances of developing some of these common conditions. What should I know about menopause? During menopause, you may experience a number of symptoms, such as:  Moderate-to-severe hot flashes.  Night sweats.  Decrease in sex drive.  Mood swings.  Headaches.  Tiredness.  Irritability.  Memory problems.  Insomnia.  Choosing to treat or not to treat menopausal changes is an individual decision that you make with your health care provider. What should I know about hormone replacement therapy and supplements? Hormone therapy products are effective for treating symptoms that are associated with menopause, such as hot flashes and night sweats. Hormone replacement carries certain risks, especially as you become older. If you are thinking about using estrogen or estrogen with progestin treatments, discuss the benefits and risks with your health care provider. What should I know about heart disease and stroke? Heart disease, heart attack, and stroke become more likely as you age. This may be due, in part, to the hormonal changes that your body experiences during menopause. These can affect how your body processes dietary fats, triglycerides, and cholesterol. Heart attack and stroke are both medical emergencies. There are many things that you can do to help prevent heart disease  and stroke:  Have your blood pressure checked at least every 1-2 years. High blood pressure causes heart disease and increases the risk of stroke.  If you are 53-22 years old, ask your health care provider if you should take aspirin to prevent a heart attack or a stroke.  Do not use any tobacco products, including cigarettes, chewing tobacco, or electronic cigarettes. If you need help quitting, ask your health care provider.  It is important to eat a healthy diet and maintain a healthy weight. ? Be sure to include plenty of vegetables, fruits, low-fat dairy products, and lean protein. ? Avoid eating foods that are high in solid fats, added sugars, or salt (sodium).  Get regular exercise. This is one of the most important things that you can do for your health. ? Try to exercise for at least 150 minutes each week. The type of exercise that you do should increase your heart rate and make you sweat. This is known as moderate-intensity exercise. ? Try to do strengthening exercises at least twice each week. Do these in addition to the moderate-intensity exercise.  Know your numbers.Ask your health care provider to check your cholesterol and your blood glucose. Continue to have your blood tested as directed by your health care provider.  What should I know about cancer screening? There are several types of cancer. Take the following steps to reduce your risk and to catch any cancer development as early as possible. Breast Cancer  Practice breast self-awareness. ? This means understanding how your breasts normally appear and feel. ? It also means doing regular breast self-exams. Let your health care provider know about any changes, no matter how small.  If you are 40  or older, have a clinician do a breast exam (clinical breast exam or CBE) every year. Depending on your age, family history, and medical history, it may be recommended that you also have a yearly breast X-ray (mammogram).  If you  have a family history of breast cancer, talk with your health care provider about genetic screening.  If you are at high risk for breast cancer, talk with your health care provider about having an MRI and a mammogram every year.  Breast cancer (BRCA) gene test is recommended for women who have family members with BRCA-related cancers. Results of the assessment will determine the need for genetic counseling and BRCA1 and for BRCA2 testing. BRCA-related cancers include these types: ? Breast. This occurs in males or females. ? Ovarian. ? Tubal. This may also be called fallopian tube cancer. ? Cancer of the abdominal or pelvic lining (peritoneal cancer). ? Prostate. ? Pancreatic.  Cervical, Uterine, and Ovarian Cancer Your health care provider may recommend that you be screened regularly for cancer of the pelvic organs. These include your ovaries, uterus, and vagina. This screening involves a pelvic exam, which includes checking for microscopic changes to the surface of your cervix (Pap test).  For women ages 21-65, health care providers may recommend a pelvic exam and a Pap test every three years. For women ages 79-65, they may recommend the Pap test and pelvic exam, combined with testing for human papilloma virus (HPV), every five years. Some types of HPV increase your risk of cervical cancer. Testing for HPV may also be done on women of any age who have unclear Pap test results.  Other health care providers may not recommend any screening for nonpregnant women who are considered low risk for pelvic cancer and have no symptoms. Ask your health care provider if a screening pelvic exam is right for you.  If you have had past treatment for cervical cancer or a condition that could lead to cancer, you need Pap tests and screening for cancer for at least 20 years after your treatment. If Pap tests have been discontinued for you, your risk factors (such as having a new sexual partner) need to be  reassessed to determine if you should start having screenings again. Some women have medical problems that increase the chance of getting cervical cancer. In these cases, your health care provider may recommend that you have screening and Pap tests more often.  If you have a family history of uterine cancer or ovarian cancer, talk with your health care provider about genetic screening.  If you have vaginal bleeding after reaching menopause, tell your health care provider.  There are currently no reliable tests available to screen for ovarian cancer.  Lung Cancer Lung cancer screening is recommended for adults 69-62 years old who are at high risk for lung cancer because of a history of smoking. A yearly low-dose CT scan of the lungs is recommended if you:  Currently smoke.  Have a history of at least 30 pack-years of smoking and you currently smoke or have quit within the past 15 years. A pack-year is smoking an average of one pack of cigarettes per day for one year.  Yearly screening should:  Continue until it has been 15 years since you quit.  Stop if you develop a health problem that would prevent you from having lung cancer treatment.  Colorectal Cancer  This type of cancer can be detected and can often be prevented.  Routine colorectal cancer screening usually begins at  age 42 and continues through age 45.  If you have risk factors for colon cancer, your health care provider may recommend that you be screened at an earlier age.  If you have a family history of colorectal cancer, talk with your health care provider about genetic screening.  Your health care provider may also recommend using home test kits to check for hidden blood in your stool.  A small camera at the end of a tube can be used to examine your colon directly (sigmoidoscopy or colonoscopy). This is done to check for the earliest forms of colorectal cancer.  Direct examination of the colon should be repeated every  5-10 years until age 71. However, if early forms of precancerous polyps or small growths are found or if you have a family history or genetic risk for colorectal cancer, you may need to be screened more often.  Skin Cancer  Check your skin from head to toe regularly.  Monitor any moles. Be sure to tell your health care provider: ? About any new moles or changes in moles, especially if there is a change in a mole's shape or color. ? If you have a mole that is larger than the size of a pencil eraser.  If any of your family members has a history of skin cancer, especially at a young age, talk with your health care provider about genetic screening.  Always use sunscreen. Apply sunscreen liberally and repeatedly throughout the day.  Whenever you are outside, protect yourself by wearing long sleeves, pants, a wide-brimmed hat, and sunglasses.  What should I know about osteoporosis? Osteoporosis is a condition in which bone destruction happens more quickly than new bone creation. After menopause, you may be at an increased risk for osteoporosis. To help prevent osteoporosis or the bone fractures that can happen because of osteoporosis, the following is recommended:  If you are 46-71 years old, get at least 1,000 mg of calcium and at least 600 mg of vitamin D per day.  If you are older than age 55 but younger than age 65, get at least 1,200 mg of calcium and at least 600 mg of vitamin D per day.  If you are older than age 54, get at least 1,200 mg of calcium and at least 800 mg of vitamin D per day.  Smoking and excessive alcohol intake increase the risk of osteoporosis. Eat foods that are rich in calcium and vitamin D, and do weight-bearing exercises several times each week as directed by your health care provider. What should I know about how menopause affects my mental health? Depression may occur at any age, but it is more common as you become older. Common symptoms of depression  include:  Low or sad mood.  Changes in sleep patterns.  Changes in appetite or eating patterns.  Feeling an overall lack of motivation or enjoyment of activities that you previously enjoyed.  Frequent crying spells.  Talk with your health care provider if you think that you are experiencing depression. What should I know about immunizations? It is important that you get and maintain your immunizations. These include:  Tetanus, diphtheria, and pertussis (Tdap) booster vaccine.  Influenza every year before the flu season begins.  Pneumonia vaccine.  Shingles vaccine.  Your health care provider may also recommend other immunizations. This information is not intended to replace advice given to you by your health care provider. Make sure you discuss any questions you have with your health care provider. Document Released: 12/28/2005  Document Revised: 05/25/2016 Document Reviewed: 08/09/2015 Elsevier Interactive Patient Education  2018 Elsevier Inc.  

## 2017-12-20 NOTE — Assessment & Plan Note (Signed)
CMET and Lipid profile today Encouraged her to consume a low fat diet Continue Atorvastatin 

## 2017-12-20 NOTE — Assessment & Plan Note (Signed)
Elevated on multiple meds Continue current meds CBC and CMET today She will continue to follow with cardiology

## 2017-12-20 NOTE — Assessment & Plan Note (Signed)
Encouraged routine exercise and weight loss Continue Tylenol for moderate pain, Tramadol for severe pain Tramadol refilled today

## 2017-12-20 NOTE — Progress Notes (Signed)
HPI:  Pt presents to the clinic today for her Medicare Wellness Exam. She is also due to follow up chronic conditions.  OA: Mainly in her knees. Complicated by her weight. She takes Tylenol and Tramadol as needed with some relief. She would like a refill of Tramadol today.  HLD with CAD: s/p stents. Her last LDL was 77/06/2017. She is taking Atorvastatin, ASA, Plavix, Carvedilol and Imdur as prescribed. She denies chest pain but has Nitro to take if needed. She finished cardiac rehab.  HTN: Her BP today is 146/94. She is taking Carvedilol, Cardura, Losartan and Aldactone as prescribed. ECG from 06/2017 reviewed. She finished cardiac rehab.  GERD: She denies any issues with this currently. She is not taking any medication for reflux at this time.   DM 2: Her last A1C was 6.7, 08/2017. She does not monitor her sugars. She is taking Metformin, Januvia and Victoza as prescribed. She does check her feet frequently. Her last eye exam was 02/2017.  Past Medical History:  Diagnosis Date  . Anemia   . Arthritis    "right knee" (10/21/2015)  . CAD (coronary artery disease)    a. Cath 10/21/15: s/p DES to RCA: moderate diffuse stenosis of mod diagonal, diffuse irregularity LCx and LAD. 2D echo 10/21/15: mod LVH, EF 60-65%, no RWMA, calcified MV, mod-severe LAE. 03/18/17 PCI with DES--> 1 diag  . Cataract   . Essential hypertension   . Family history of adverse reaction to anesthesia    "oldest sister, [name], related to brain formation at back of head; when they put her to sleep it's hard to wake her up"  . GERD (gastroesophageal reflux disease)   . History of blood transfusion    "related to ruptured tubal pregnancy"  . Hyperlipidemia   . Hypertensive heart disease   . Morbid obesity ([place])   . Type II diabetes mellitus ([place])     Current Outpatient Medications  Medication Sig Dispense Refill  . acetaminophen (TYLENOL) 325 MG tablet Take 2 tablets (650 mg total) by mouth every 4 (four) hours as  needed for headache or mild pain.    [name] aspirin 81 MG tablet Take 81 mg by mouth daily.    [name] atorvastatin (LIPITOR) 80 MG tablet TAKE 1 TABLET BY MOUTH EVERY EVENING 90 tablet 3  . carvedilol (COREG) 25 MG tablet Take 1 tablet (25 mg total) by mouth 2 (two) times daily. 180 tablet 3  . clopidogrel (PLAVIX) 75 MG tablet TAKE 1 TABLET (75 MG TOTAL) BY MOUTH DAILY. 90 tablet 2  . DiphenhydrAMINE HCl, Sleep, (UNISOM SLEEPGELS PO) Take 1 tablet by mouth at bedtime as needed (for sleep).     [name] doxazosin (CARDURA) 4 MG tablet TAKE 1 TABLET (4 MG TOTAL) BY MOUTH AT BEDTIME. 90 tablet 0  . ferrous sulfate 325 (65 FE) MG tablet TAKE 1 TABLET (325 MG TOTAL) BY MOUTH 3 (THREE) TIMES DAILY WITH MEALS. 90 tablet 3  . glipiZIDE (GLUCOTROL) 10 MG tablet TAKE 1 TABLET (10 MG TOTAL) BY MOUTH 2 (TWO) TIMES DAILY BEFORE A MEAL. 180 tablet 1  . glucose blood (ONE TOUCH ULTRA TEST) test strip 1 each by Other route 2 (two) times daily. 100 each 11  . Insulin Pen Needle (B-D UF III MINI PEN NEEDLES) 31G X 5 MM MISC 1 each by Other route 2 (two) times daily as needed. 100 each 1  . isosorbide mononitrate (IMDUR) 30 MG 24 hr tablet Take 1 tablet (30 mg total) by mouth daily.  90 tablet 3  . losartan (COZAAR) 100 MG tablet TAKE 1 TABLET BY MOUTH EVERY DAY 90 tablet 1  . metFORMIN (GLUCOPHAGE) 1000 MG tablet Take 1 tablet (1,000 mg total) by mouth 2 (two) times daily with a meal. 180 tablet 1  . Multiple Vitamins-Minerals (MULTIVITAMIN WITH MINERALS) tablet Take 1 tablet by mouth daily.    . nitroGLYCERIN (NITROSTAT) 0.4 MG SL tablet Place 1 tablet (0.4 mg total) under the tongue every 5 (five) minutes as needed for chest pain (up to 3 doses). 25 tablet 3  . ONETOUCH DELICA LANCETS 37S MISC 1 each by Does not apply route 2 (two) times daily. 100 each 2  . sitaGLIPtin (JANUVIA) 100 MG tablet Take 1 tablet (100 mg total) by mouth daily. MUST RESCHEDULE ANNUAL PHYSICAL 90 tablet 0  . spironolactone (ALDACTONE) 25 MG tablet TAKE 1  TABLET BY MOUTH EVERY DAY 90 tablet 1  . traMADol (ULTRAM) 50 MG tablet Take 1 tablet (50 mg total) by mouth every 6 (six) hours as needed. 15 tablet 0  . VICTOZA 18 MG/3ML SOPN INJECT 0.2 MLS (1.2 MG TOTAL) INTO THE SKIN DAILY 6 pen 0   No current facility-administered medications for this visit.     Allergies  Allergen Reactions  . Iodinated Diagnostic Agents Hives  . Sulfa Antibiotics Hives    Family History  Problem Relation Age of Onset  . Diabetes Mother   . Heart attack Mother        2007  . Hypertension Mother   . Hyperlipidemia Mother   . Heart disease Mother   . Heart attack Father   . Stroke Father   . Hypertension Father   . Hyperlipidemia Father   . Stomach cancer Sister   . Diabetes Sister   . Diabetes Sister   . Diabetes Sister     Social History   Socioeconomic History  . Marital status: Divorced    Spouse name: Not on file  . Number of children: 3  . Years of education: doctorate  . Highest education level: Not on file  Social Needs  . Financial resource strain: Not on file  . Food insecurity - worry: Not on file  . Food insecurity - inability: Not on file  . Transportation needs - medical: Not on file  . Transportation needs - non-medical: Not on file  Occupational History  . Occupation: Pharmacist, hospital  Tobacco Use  . Smoking status: Never Smoker  . Smokeless tobacco: Never Used  Substance and Sexual Activity  . Alcohol use: No  . Drug use: No  . Sexual activity: Not Currently  Other Topics Concern  . Not on file  Social History Narrative   Caffeine use-yes   Regular exercise-no    Hospitiliaztions: 06/2017, Syncope, HTN, CAD   Health Maintenance:    Flu: 08/2017  Tetanus: 09/2012  Pneumovax: 09/2012  Prevnar: 09/2015  Shingrix: never  Zostavax: never  Mammogram: 12/2015  Pap Smear: hysterectomy  Bone Density: never  Colon Screening: 10/2011  Eye Doctor: annually  Dental Exam: as needed   Providers:   PCP: Webb Silversmith,  NP-C  Cardiologist: Dr. Harrington Challenger   I have personally reviewed and have noted:  1. The patient's medical and social history 2. Their use of alcohol, tobacco or illicit drugs 3. Their current medications and supplements 4. The patient's functional ability including ADL's, fall risks, home safety risks and hearing or visual impairment. 5. Diet and physical activities 6. Evidence for depression or mood disorder  Subjective:  Review of Systems:   Constitutional: Denies fever, malaise, fatigue, headache or abrupt weight changes.  HEENT: Denies eye pain, eye redness, ear pain, ringing in the ears, wax buildup, runny nose, nasal congestion, bloody nose, or sore throat. Respiratory: Denies difficulty breathing, shortness of breath, cough or sputum production.   Cardiovascular: Pt reports intermittent swelling in her feet. Denies chest pain, chest tightness, palpitations or swelling in the hands.  Gastrointestinal: Denies abdominal pain, bloating, constipation, diarrhea or blood in the stool.  GU: Denies urgency, frequency, pain with urination, burning sensation, blood in urine, odor or discharge. Musculoskeletal: Pt reports knee pain. Denies decrease in range of motion, difficulty with gait, muscle pain or joint swelling.  Skin: Denies redness, rashes, lesions or ulcercations.  Neurological: Denies dizziness, difficulty with memory, difficulty with speech or problems with balance and coordination.  Psych: Denies anxiety, depression, SI/HI.  No other specific complaints in a complete review of systems (except as listed in HPI above).  Objective:  PE:   BP (!) 146/94   Pulse 78   Temp 97.9 F (36.6 C) (Oral)   Ht 5\' 5"  (1.651 m)   Wt (!) 303 lb (137.4 kg)   SpO2 99%   BMI 50.42 kg/m   Wt Readings from Last 3 Encounters:  11/29/17 (!) 308 lb (139.7 kg)  11/24/17 (!) 308 lb (139.7 kg)  11/22/17 (!) 308 lb (139.7 kg)    General: Appears her stated age, obese in NAD. Skin: Warm, dry  and intact. No ulcerations noted. HEENT: Head: normal shape and size; Eyes: sclera white, no icterus, conjunctiva pink, PERRLA and EOMs intact; Ears: Tm's gray and intact, normal light reflex, moderate cerumen buildup; Throat/Mouth: Teeth present, mucosa pink and moist, no exudate, lesions or ulcerations noted.  Neck: Neck supple, trachea midline. No masses, lumps or thyromegaly present.  Cardiovascular: Normal rate and rhythm. S1,S2 noted.  No murmur, rubs or gallops noted. No JVD or BLE edema. No carotid bruits noted. Pulmonary/Chest: Normal effort and positive vesicular breath sounds. No respiratory distress. No wheezes, rales or ronchi noted.  Abdomen: Soft and nontender. Normal bowel sounds. No distention or masses noted. Liver, spleen and kidneys non palpable. Musculoskeletal:  Strength 5/5 BUE/BLE. No signs of joint swelling.  Neurological: Alert and oriented. Cranial nerves II-XII grossly intact. Coordination normal.  Psychiatric: Mood and affect normal. Behavior is normal. Judgment and thought content normal.    BMET    Component Value Date/Time   NA 138 11/24/2017 1621   NA 142 04/08/2017 1353   K 4.4 11/24/2017 1621   CL 105 11/24/2017 1621   CO2 26 11/24/2017 1621   GLUCOSE 44 (LL) 11/24/2017 1621   BUN 20 11/24/2017 1621   BUN 19 04/08/2017 1353   CREATININE 1.50 (H) 11/24/2017 1621   CREATININE 1.55 (H) 08/10/2016 0850   CALCIUM 9.2 11/24/2017 1621   GFRNONAA 34 (L) 11/24/2017 1621   GFRAA 40 (L) 11/24/2017 1621    Lipid Panel     Component Value Date/Time   CHOL 124 07/14/2017 0638   TRIG 56 07/14/2017 0638   HDL 36 (L) 07/14/2017 0638   CHOLHDL 3.4 07/14/2017 0638   VLDL 11 07/14/2017 0638   LDLCALC 77 07/14/2017 0638    CBC    Component Value Date/Time   WBC 5.5 11/24/2017 1621   RBC 4.65 11/24/2017 1621   HGB 10.0 (L) 11/24/2017 1621   HGB 10.6 (L) 03/15/2017 1341   HCT 34.0 (L) 11/24/2017 1621   HCT 34.7 03/15/2017 1341  PLT 234 11/24/2017 1621    PLT 208 03/15/2017 1341   MCV 73.1 (L) 11/24/2017 1621   MCV 72 (L) 03/15/2017 1341   MCH 21.5 (L) 11/24/2017 1621   MCHC 29.4 (L) 11/24/2017 1621   RDW 15.4 11/24/2017 1621   RDW 16.2 (H) 03/15/2017 1341   LYMPHSABS 2.2 11/24/2017 1621   MONOABS 0.3 11/24/2017 1621   EOSABS 0.1 11/24/2017 1621   BASOSABS 0.0 11/24/2017 1621    Hgb A1C Lab Results  Component Value Date   HGBA1C 6.7 (H) 09/09/2017      Assessment and Plan:   Medicare Annual Wellness Visit:  Diet:  She does eat meat. She consumes fruits and veggies. She tries to avoid fried foods. She drinks mostly flavored water, coffee. Physical activity: None Depression/mood screen: Negative Hearing: Intact to whispered voice Visual acuity: Grossly normal, performs annual eye exam  ADLs: Capable Fall risk: None Home safety: Good Cognitive evaluation: Intact to orientation, naming, recall and repetition EOL planning: No adv directives, full code/ I agree  Preventative Medicine: flu, tetanus, prevnar and pneumovax UTD. She declines Shingrix or Zostovax. She declines mammogram or bone density. She does not need pap smears. Colon screening UTD. Encouraged her to consume a balanced diet and exercise regimen. Advised her to see an eye doctor and dentist annually. Will check CBC, CMET, Lipid, A1C, Hep C and Vit D today.   Next appointment: 6 months, follow up DM 2   Nakiyah Beverley, [place], NP

## 2017-12-21 LAB — HEPATITIS C ANTIBODY
HEP C AB: NONREACTIVE
SIGNAL TO CUT-OFF: 0.03 (ref <1.00)

## 2018-01-08 ENCOUNTER — Other Ambulatory Visit: Payer: Self-pay | Admitting: Internal Medicine

## 2018-01-09 ENCOUNTER — Other Ambulatory Visit: Payer: Self-pay | Admitting: Internal Medicine

## 2018-01-09 MED ORDER — FERROUS SULFATE 325 (65 FE) MG PO TABS: 325.0000 mg | ORAL_TABLET | Freq: Every day | ORAL | 1 refills | Status: DC

## 2018-01-09 NOTE — Addendum Note (Signed)
Addended by: Lurlean Nanny on: 01/09/2018 03:31 PM   Modules accepted: Orders

## 2018-01-09 NOTE — Telephone Encounter (Signed)
Please advise sig on the ferrous sulfate as TID is not covered by insurance

## 2018-01-29 NOTE — Research (Signed)
OPTIMIZE Labs per protocol Component     Latest Ref Rng & Units 03/18/2017 Procedure Troponin 03/18/2017 Post PCI 4-12 03/19/2017 Post PCI 12-20          2:21 PM  9:04 PM 03:10 AM  Troponin I     <0.03 ng/mL <0.03 0.04 (HH) 0.10 (HH)

## 2018-02-13 ENCOUNTER — Telehealth: Payer: Self-pay | Admitting: Internal Medicine

## 2018-02-13 NOTE — Telephone Encounter (Signed)
Patient called and advised to call the pharmacy to request her refill of ferrous sulfate. She says "I am almost out because I take TID." I advised that a new prescription for once a day was sent to the pharmacy on 01/07/18 90 tab/1 refill, so they should have it there to fill. She says "so just call them and ask them to fill the once a day prescription?" I advised yes, call the pharmacy. She verbalized understanding.

## 2018-02-13 NOTE — Telephone Encounter (Signed)
Copied from [place] [phone]. Topic: Quick Communication - Rx Refill/Question >> Feb 13, 2018  2:45 PM Synthia Innocent wrote: Medication: ferrous sulfate 325 (65 FE) MG tablet  Has the patient contacted their pharmacy? Yes.   (Agent: If no, request that the patient contact the pharmacy for the refill.) Preferred Pharmacy (with phone number or street name): [facility] [address] Agent: Please be advised that RX refills may take up to 3 business days. We ask that you follow-up with your pharmacy.

## 2018-02-26 ENCOUNTER — Other Ambulatory Visit: Payer: Self-pay | Admitting: Internal Medicine

## 2018-02-27 NOTE — Telephone Encounter (Signed)
Last filled 12/20/2017.... Please advise

## 2018-02-28 ENCOUNTER — Other Ambulatory Visit: Payer: Self-pay | Admitting: Cardiology

## 2018-02-28 ENCOUNTER — Other Ambulatory Visit: Payer: Self-pay | Admitting: Internal Medicine

## 2018-02-28 DIAGNOSIS — I10 Essential (primary) hypertension: Secondary | ICD-10-CM

## 2018-02-28 DIAGNOSIS — E1165 Type 2 diabetes mellitus with hyperglycemia: Secondary | ICD-10-CM

## 2018-02-28 NOTE — Telephone Encounter (Signed)
REFILL 

## 2018-03-06 ENCOUNTER — Other Ambulatory Visit: Payer: Self-pay | Admitting: Internal Medicine

## 2018-03-06 ENCOUNTER — Other Ambulatory Visit: Payer: Self-pay | Admitting: Physician Assistant

## 2018-03-06 DIAGNOSIS — R7989 Other specified abnormal findings of blood chemistry: Secondary | ICD-10-CM

## 2018-03-06 DIAGNOSIS — I25118 Atherosclerotic heart disease of native coronary artery with other forms of angina pectoris: Secondary | ICD-10-CM

## 2018-03-06 DIAGNOSIS — Z9861 Coronary angioplasty status: Principal | ICD-10-CM

## 2018-03-06 DIAGNOSIS — I119 Hypertensive heart disease without heart failure: Secondary | ICD-10-CM

## 2018-03-06 DIAGNOSIS — E785 Hyperlipidemia, unspecified: Secondary | ICD-10-CM

## 2018-03-06 DIAGNOSIS — I2510 Atherosclerotic heart disease of native coronary artery without angina pectoris: Secondary | ICD-10-CM

## 2018-03-06 DIAGNOSIS — R601 Generalized edema: Secondary | ICD-10-CM

## 2018-03-06 DIAGNOSIS — I10 Essential (primary) hypertension: Secondary | ICD-10-CM

## 2018-03-06 NOTE — Telephone Encounter (Signed)
Pt request medication that is not on medication list. Please address. Thank You

## 2018-03-06 NOTE — Telephone Encounter (Signed)
Lasix was dc'd prior to  last ov 07/2017.  At that time pt was instructed to call back with weight gain or swelling and f/u with :Dr. Harrington Challenger in 3-6 months.     The lasix was filled again Jan 2019 at [facility] according to this refill request.   Left message for patient to call back so we can discuss why this needs refilled.

## 2018-03-07 ENCOUNTER — Other Ambulatory Visit: Payer: Self-pay | Admitting: Internal Medicine

## 2018-03-07 DIAGNOSIS — I10 Essential (primary) hypertension: Secondary | ICD-10-CM

## 2018-03-25 ENCOUNTER — Other Ambulatory Visit: Payer: Self-pay | Admitting: Internal Medicine

## 2018-03-25 DIAGNOSIS — E119 Type 2 diabetes mellitus without complications: Secondary | ICD-10-CM

## 2018-03-28 NOTE — Telephone Encounter (Signed)
Called patient but did not hear back from her. Placed 3 month recall in epic.  Pt is overdue for visit with Dr. Harrington Challenger.

## 2018-04-01 ENCOUNTER — Telehealth: Payer: Self-pay | Admitting: *Deleted

## 2018-04-01 NOTE — Telephone Encounter (Signed)
Called and spoke with patient to schedule 12 month OPTIMIZE research study EKG and Exam 04/08/18 Dr. Lia Foyer stated he would be avalable.

## 2018-04-06 IMAGING — DX DG FOOT COMPLETE 3+V*L*
3 series · 3 of 3 positions shown · non-contrast
Comparison: None.

CLINICAL DATA: Pain and swelling

EXAM:
LEFT FOOT - COMPLETE 3+ VIEW

[foot ap]
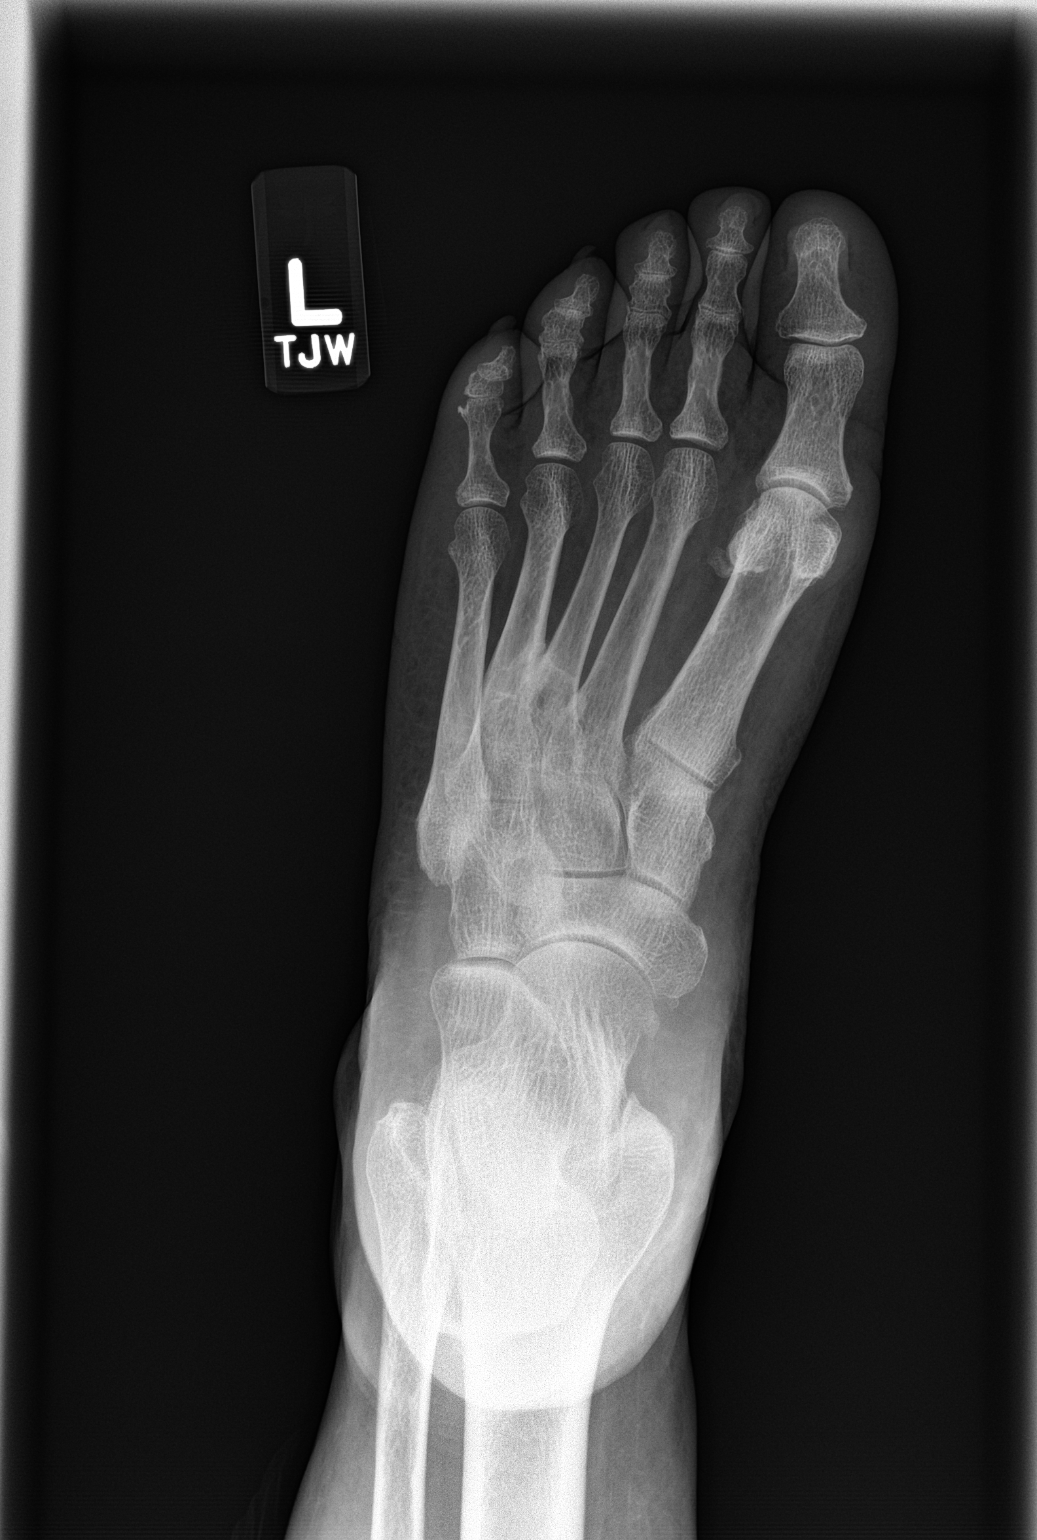

[foot obl]
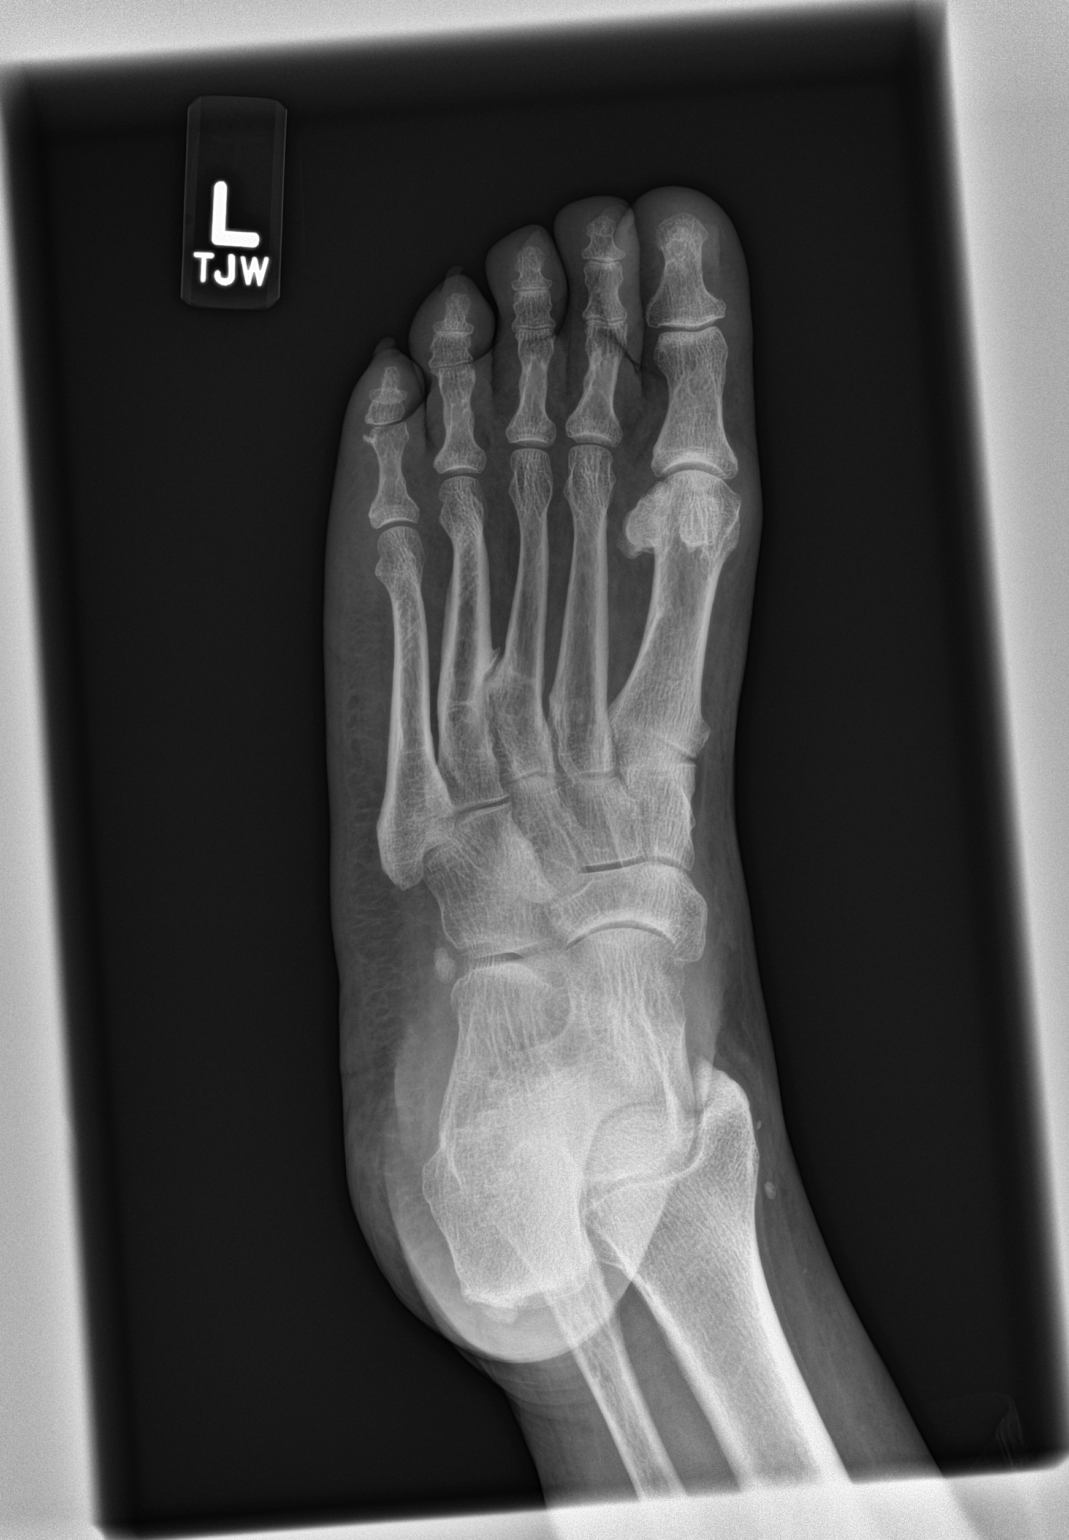

[foot lat]
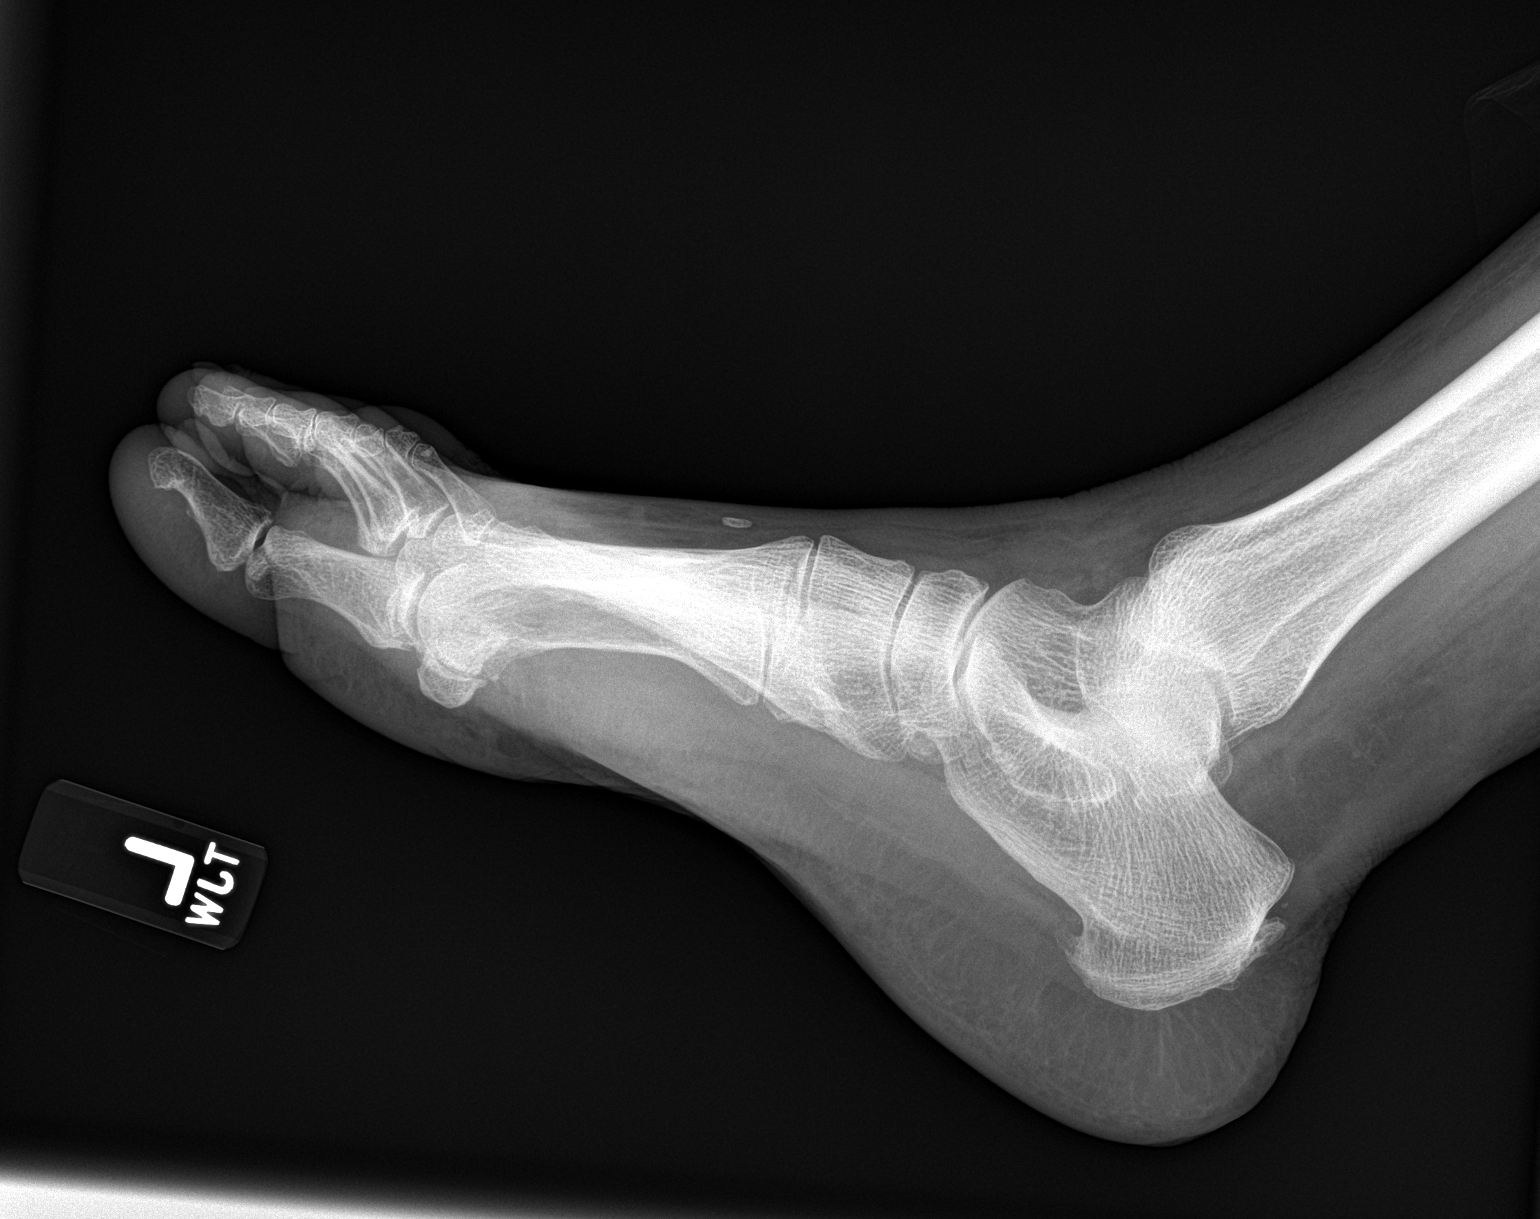

[3 of 3 positions shown; findings below may reference images not displayed]

FINDINGS: Frontal, oblique, and lateral views were obtained. There is no
fracture or dislocation. There is slight narrowing of all PIP and
DIP joints as well as the first MTP joint. No erosive change. There
are small posterior and inferior calcaneal spurs. There is a
phlebolith in the dorsal midfoot region.
IMPRESSION: Slight narrowing of multiple distal joints. No fracture or
dislocation. There are calcaneal spurs.

## 2018-04-08 ENCOUNTER — Encounter: Payer: Medicare Other | Admitting: *Deleted

## 2018-04-08 VITALS — BP 140/61 | HR 70

## 2018-04-08 DIAGNOSIS — Z006 Encounter for examination for normal comparison and control in clinical research program: Secondary | ICD-10-CM

## 2018-04-08 NOTE — Progress Notes (Addendum)
  OPTIMIZE Research study month 12 follow up completed.

## 2018-05-11 ENCOUNTER — Other Ambulatory Visit: Payer: Self-pay | Admitting: Internal Medicine

## 2018-05-19 ENCOUNTER — Other Ambulatory Visit: Payer: Self-pay | Admitting: Internal Medicine

## 2018-05-28 ENCOUNTER — Other Ambulatory Visit: Payer: Self-pay | Admitting: Internal Medicine

## 2018-06-10 ENCOUNTER — Other Ambulatory Visit: Payer: Self-pay | Admitting: Internal Medicine

## 2018-06-10 DIAGNOSIS — I10 Essential (primary) hypertension: Secondary | ICD-10-CM

## 2018-06-18 ENCOUNTER — Other Ambulatory Visit: Payer: Self-pay

## 2018-06-18 ENCOUNTER — Encounter (HOSPITAL_COMMUNITY): Payer: Self-pay | Admitting: Emergency Medicine

## 2018-06-18 ENCOUNTER — Encounter: Payer: Self-pay | Admitting: Internal Medicine

## 2018-06-18 ENCOUNTER — Ambulatory Visit: Payer: Self-pay | Admitting: Internal Medicine

## 2018-06-18 ENCOUNTER — Emergency Department (HOSPITAL_BASED_OUTPATIENT_CLINIC_OR_DEPARTMENT_OTHER): Payer: Medicare Other

## 2018-06-18 ENCOUNTER — Encounter (INDEPENDENT_AMBULATORY_CARE_PROVIDER_SITE_OTHER): Payer: Self-pay

## 2018-06-18 ENCOUNTER — Emergency Department (HOSPITAL_COMMUNITY)
Admission: EM | Admit: 2018-06-18 | Discharge: 2018-06-18 | Disposition: A | Discharge status: Home / Self Care | Payer: Medicare Other | Attending: Emergency Medicine | Admitting: Emergency Medicine

## 2018-06-18 DIAGNOSIS — I2510 Atherosclerotic heart disease of native coronary artery without angina pectoris: Secondary | ICD-10-CM | POA: Insufficient documentation

## 2018-06-18 DIAGNOSIS — M7989 Other specified soft tissue disorders: Secondary | ICD-10-CM | POA: Diagnosis not present

## 2018-06-18 DIAGNOSIS — I10 Essential (primary) hypertension: Secondary | ICD-10-CM | POA: Insufficient documentation

## 2018-06-18 DIAGNOSIS — M79662 Pain in left lower leg: Secondary | ICD-10-CM | POA: Diagnosis not present

## 2018-06-18 DIAGNOSIS — E119 Type 2 diabetes mellitus without complications: Secondary | ICD-10-CM | POA: Insufficient documentation

## 2018-06-18 DIAGNOSIS — Z7901 Long term (current) use of anticoagulants: Secondary | ICD-10-CM | POA: Diagnosis not present

## 2018-06-18 DIAGNOSIS — Z79899 Other long term (current) drug therapy: Secondary | ICD-10-CM | POA: Insufficient documentation

## 2018-06-18 DIAGNOSIS — Z794 Long term (current) use of insulin: Secondary | ICD-10-CM | POA: Insufficient documentation

## 2018-06-18 DIAGNOSIS — Z7982 Long term (current) use of aspirin: Secondary | ICD-10-CM | POA: Diagnosis not present

## 2018-06-18 DIAGNOSIS — M79605 Pain in left leg: Secondary | ICD-10-CM

## 2018-06-18 DIAGNOSIS — M79609 Pain in unspecified limb: Secondary | ICD-10-CM

## 2018-06-18 LAB — CBC WITH DIFFERENTIAL/PLATELET
ABS IMMATURE GRANULOCYTES: 0 10*3/uL (ref 0.0–0.1)
BASOS ABS: 0 10*3/uL (ref 0.0–0.1)
Basophils Relative: 0 %
EOS PCT: 2 %
Eosinophils Absolute: 0.1 10*3/uL (ref 0.0–0.7)
HEMATOCRIT: 34.4 % — AB (ref 36.0–46.0)
Hemoglobin: 9.5 g/dL — ABNORMAL LOW (ref 12.0–15.0)
IMMATURE GRANULOCYTES: 0 %
LYMPHS ABS: 1.4 10*3/uL (ref 0.7–4.0)
LYMPHS PCT: 28 %
MCH: 22.4 pg — ABNORMAL LOW (ref 26.0–34.0)
MCHC: 27.6 g/dL — AB (ref 30.0–36.0)
MCV: 81.1 fL (ref 78.0–100.0)
Monocytes Absolute: 0.4 10*3/uL (ref 0.1–1.0)
Monocytes Relative: 8 %
NEUTROS ABS: 3 10*3/uL (ref 1.7–7.7)
Neutrophils Relative %: 62 %
Platelets: 179 10*3/uL (ref 150–400)
RBC: 4.24 MIL/uL (ref 3.87–5.11)
RDW: 15.3 % (ref 11.5–15.5)
WBC: 4.9 10*3/uL (ref 4.0–10.5)

## 2018-06-18 LAB — BASIC METABOLIC PANEL
ANION GAP: 10 (ref 5–15)
BUN: 22 mg/dL (ref 8–23)
CHLORIDE: 108 mmol/L (ref 98–111)
CO2: 22 mmol/L (ref 22–32)
CREATININE: 1.49 mg/dL — AB (ref 0.44–1.00)
Calcium: 8.4 mg/dL — ABNORMAL LOW (ref 8.9–10.3)
GFR calc Af Amer: 40 mL/min — ABNORMAL LOW (ref >60)
GFR calc non Af Amer: 34 mL/min — ABNORMAL LOW (ref >60)
Glucose, Bld: 159 mg/dL — ABNORMAL HIGH (ref 70–99)
POTASSIUM: 4.3 mmol/L (ref 3.5–5.1)
SODIUM: 140 mmol/L (ref 135–145)

## 2018-06-18 NOTE — ED Notes (Signed)
Discharge instructions discussed with Pt. Pt verbalized understanding. Pt stable and ambulatory.    

## 2018-06-18 NOTE — Telephone Encounter (Signed)
Before calling pt, noted an email to the PCP c/o left leg swelling and discoloration. When I called pt to triage her, she stated she had just gotten off the phone with someone at the office who advised pt to go to the ED. Skyped Rena to verify.

## 2018-06-18 NOTE — Telephone Encounter (Signed)
I spoke with Alicia [name]; on 06/16/18 pts lower lt leg was hurting; yesterday noticed some discoloration of lower lt leg with swelling; today there is more swelling in lt lower leg, lower lt leg is discolored more (purple in color) today is warm to touch and there is a knot on front of leg. Alicia [name] does not remember any injury.No SOB. Alicia [name] will go to [facility] ED now. FYI to Avie Echevaria NP.

## 2018-06-18 NOTE — ED Triage Notes (Signed)
Patient complains of painful bump on the anterior of her lower left leg since Sunday. No redness, not warm to the touch. Patient alert, oriented, and ambulating independently with steady gait.

## 2018-06-18 NOTE — Discharge Instructions (Addendum)
Please follow up with your primary care provider within 5-7 days for re-evaluation of your symptoms. If you do not have a primary care provider, information for a healthcare clinic has been provided for you to make arrangements for follow up care. Please return to the emergency department for any new or worsening symptoms. ° °

## 2018-06-18 NOTE — Progress Notes (Signed)
Left lower extremity venous duplex completed. There is no evidence of a DVT. There is a cyst like structure in the left popliteal fossa consistent with a possible Baker's cyst. Alicia [name]  06/18/2018 3:17 PM

## 2018-06-18 NOTE — ED Notes (Signed)
Pt transported to [facility]

## 2018-06-18 NOTE — ED Provider Notes (Signed)
[place] EMERGENCY DEPARTMENT Provider Note   CSN: [id number] Arrival date & time: 06/18/18  1129     History   Chief Complaint Chief Complaint  Patient presents with  . Leg Pain    HPI Alicia [name] is a 72 y.o. female.  HPI   Patient is a 72 year old female with history of anemia, CAD, hypertension, GERD, hyperlipidemia, morbid obesity, T2DM who presents the emergency department today for evaluation of left lower leg pain that began 4 days ago.  Patient states that she has had redness, pain, swelling to the left lower extremity that has improved since onset.  Initially her pain was "11"/10, however symptoms have improved after taking tramadol and putting Neosporin on the area. Rates pain as mild currently. Is worse with movement and is constant.   She noted that she had a painful "knot "to the anterior left shin which has resolved.  Reports she had a fever 4 days ago, but has had no fever since.  Denies any other symptoms including no chest pain, shortness of breath URI symptoms, abdominal pain, nausea, vomiting, diarrhea or urinary symptoms. Has chronic BLE edema and is on lasix for this.  Denies any recent falls or trauma.  Denies hemoptysis, recent surgery/trauma, recent long travel, hormone use, personal hx of cancer, or hx of DVT/PE.   Past Medical History:  Diagnosis Date  . Anemia   . Arthritis    "right knee" (10/21/2015)  . CAD (coronary artery disease)    a. Cath 10/21/15: s/p DES to RCA: moderate diffuse stenosis of mod diagonal, diffuse irregularity LCx and LAD. 2D echo 10/21/15: mod LVH, EF 60-65%, no RWMA, calcified MV, mod-severe LAE. 03/18/17 PCI with DES--> 1 diag  . Cataract   . Essential hypertension   . Family history of adverse reaction to anesthesia    "oldest sister, [name], related to brain formation at back of head; when they put her to sleep it's hard to wake her up"  . GERD (gastroesophageal reflux disease)   . History of blood  transfusion    "related to ruptured tubal pregnancy"  . Hyperlipidemia   . Hypertensive heart disease   . Morbid obesity ([place])   . Type II diabetes mellitus [facility])     Patient Active Problem List   Diagnosis Date Noted  . OA (osteoarthritis) of knee 12/20/2017  . Type 2 diabetes mellitus without complication, without long-term current use of insulin ([place]) 12/20/2017  . Progressive angina ([place])   . CAD S/P percutaneous coronary angioplasty 10/22/2015  . Essential hypertension 10/22/2015  . Microcytic anemia 10/22/2015  . Hyperlipidemia 10/09/2012  . GERD (gastroesophageal reflux disease) 10/09/2012    Past Surgical History:  Procedure Laterality Date  . ABDOMINAL HYSTERECTOMY  1988  . APPENDECTOMY  1985  . [place] SURGERY  1984  . CARDIAC CATHETERIZATION N/A 10/21/2015   Procedure: Right/Left Heart Cath and Coronary Angiography;  Surgeon: [name], MD;  Location: [place] CV LAB;  Service: Cardiovascular;  Laterality: N/A;  . CHOLECYSTECTOMY OPEN  1984  . CORONARY STENT INTERVENTION N/A 03/18/2017   Procedure: Coronary Stent Intervention;  Surgeon: [name], MD;  Location: [place] CV LAB;  Service: Cardiovascular;  Laterality: N/A;  . CORONARY STENT PLACEMENT  03/18/2017   A OPTIMIZE STUDY Drug Eluting Stent (2.5 mm 18 mm - post-dilated to 2.7 mm) was successfully placed  . ECTOPIC PREGNANCY SURGERY    . LEFT HEART CATH AND CORONARY ANGIOGRAPHY N/A 03/18/2017   Procedure:  Left Heart Cath and Coronary Angiography;  Surgeon: [name], MD;  Location: [place] CV LAB;  Service: Cardiovascular;  Laterality: N/A;  . REDUCTION MAMMAPLASTY Bilateral ~ 1976  . TONSILLECTOMY       OB History   None      Home Medications    Prior to Admission medications   Medication Sig Start Date End Date Taking? Authorizing Provider  acetaminophen (TYLENOL) 325 MG tablet Take 2 tablets (650 mg total) by mouth every 4 (four) hours as needed for headache or mild pain.  03/19/17   Erlene Quan, PA-C  aspirin 81 MG tablet Take 81 mg by mouth daily.    [provider]  atorvastatin (LIPITOR) 80 MG tablet TAKE 1 TABLET BY MOUTH EVERY EVENING 08/13/17   Dunn, Dayna N, PA-C  carvedilol (COREG) 25 MG tablet Take 1 tablet (25 mg total) by mouth 2 (two) times daily. Please make yearly appt with Dr. Harrington Challenger for September for future refills.1st attempt 05/20/18   [name], MD  clopidogrel (PLAVIX) 75 MG tablet Take 1 tablet (75 mg total) by mouth daily. Please make yearly appt with Dr. Harrington Challenger for September for future refills. 1st attempt 05/28/18   [name], MD  DiphenhydrAMINE HCl, Sleep, (UNISOM SLEEPGELS PO) Take 1 tablet by mouth at bedtime as needed (for sleep).     [provider]  doxazosin (CARDURA) 4 MG tablet TAKE 1 TABLET (4 MG TOTAL) BY MOUTH AT BEDTIME. 06/11/18   Jearld Fenton, NP  ferrous sulfate 325 (65 FE) MG tablet TAKE 1 TABLET BY MOUTH EVERY DAY 05/13/18   Jearld Fenton, NP  glipiZIDE (GLUCOTROL) 10 MG tablet TAKE 1 TABLET (10 MG TOTAL) BY MOUTH 2 (TWO) TIMES DAILY BEFORE A MEAL. 02/28/18   Jearld Fenton, NP  glucose blood (ONE TOUCH ULTRA TEST) test strip 1 each by Other route 2 (two) times daily. 07/31/17   Jearld Fenton, NP  Insulin Pen Needle (B-D UF III MINI PEN NEEDLES) 31G X 5 MM MISC 1 each by Other route 2 (two) times daily as needed. 09/06/17   Jearld Fenton, NP  isosorbide mononitrate (IMDUR) 30 MG 24 hr tablet TAKE 1 TABLET BY MOUTH EVERY DAY 03/06/18   Richardson Dopp T, PA-C  losartan (COZAAR) 100 MG tablet TAKE 1 TABLET BY MOUTH EVERY DAY 02/28/18   Jearld Fenton, NP  metFORMIN (GLUCOPHAGE) 1000 MG tablet TAKE 1 TABLET (1,000 MG TOTAL) BY MOUTH 2 (TWO) TIMES DAILY WITH A MEAL. 02/28/18   Erlene Quan, PA-C  Multiple Vitamins-Minerals (MULTIVITAMIN WITH MINERALS) tablet Take 1 tablet by mouth daily.    [provider]  nitroGLYCERIN (NITROSTAT) 0.4 MG SL tablet Place 1 tablet (0.4 mg total) under the tongue  every 5 (five) minutes as needed for chest pain (up to 3 doses). 10/22/15   Dunn, Nedra Hai, PA-C  ONETOUCH DELICA LANCETS 38H MISC 1 each by Does not apply route 2 (two) times daily. 12/19/17   Jearld Fenton, NP  sitaGLIPtin (JANUVIA) 100 MG tablet Take 1 tablet (100 mg total) by mouth daily. 03/26/18   Jearld Fenton, NP  spironolactone (ALDACTONE) 25 MG tablet TAKE 1 TABLET BY MOUTH EVERY DAY 02/28/18   Jearld Fenton, NP  traMADol (ULTRAM) 50 MG tablet TAKE 1 TABLET (50 MG TOTAL) BY MOUTH EVERY 12 (TWELVE) HOURS AS NEEDED. 02/27/18   Jearld Fenton, NP  VICTOZA 18 MG/3ML SOPN INJECT 0.2 MLS (1.2 MG  TOTAL) INTO THE SKIN DAILY 01/08/18   Jearld Fenton, NP    Family History Family History  Problem Relation Age of Onset  . Diabetes Mother   . Heart attack Mother        2007  . Hypertension Mother   . Hyperlipidemia Mother   . Heart disease Mother   . Heart attack Father   . Stroke Father   . Hypertension Father   . Hyperlipidemia Father   . Stomach cancer Sister   . Diabetes Sister   . Diabetes Sister   . Diabetes Sister     Social History Social History   Tobacco Use  . Smoking status: Never Smoker  . Smokeless tobacco: Never Used  Substance Use Topics  . Alcohol use: No  . Drug use: No     Allergies   Iodinated diagnostic agents and Sulfa antibiotics   Review of Systems Review of Systems  Constitutional: Positive for fever. Unexpected weight change: resolved.  HENT: Negative for congestion, rhinorrhea and sore throat.   Respiratory: Negative for cough and shortness of breath.   Cardiovascular: Positive for leg swelling. Negative for chest pain and palpitations.  Gastrointestinal: Negative for abdominal pain, diarrhea, nausea and vomiting.  Genitourinary: Negative for dysuria and frequency.  Musculoskeletal:       LLE pain, swelling, redness, knot to LLE  Skin: Positive for color change. Negative for wound.  Neurological: Negative for headaches.    Physical  Exam Updated Vital Signs BP 133/69   Pulse 73   Temp (!) 97.5 F (36.4 C) (Oral)   Resp 16   SpO2 100%   Physical Exam  Constitutional: She appears well-developed and well-nourished. No distress.  HENT:  Head: Normocephalic and atraumatic.  Eyes: Conjunctivae are normal.  Neck: Neck supple.  Cardiovascular: Normal rate, regular rhythm, normal heart sounds and intact distal pulses.  No murmur heard. Pulmonary/Chest: Effort normal and breath sounds normal. No respiratory distress. She has no wheezes. She has no rales.  Abdominal: Soft. There is no tenderness.  Musculoskeletal:  1+ pitting edema bilaterally. LLE appears slightly larger then right though this is subtle. Very mild erythema to left shin. Mild TTP to the left shin and left calf. FROM of the BLE. Distal pulses and sensation intact. Brisk cap refill. No breaks in the skin.   Neurological: She is alert.  Skin: Skin is warm and dry. Capillary refill takes less than 2 seconds.  Psychiatric: She has a normal mood and affect.  Nursing note and vitals reviewed.   ED Treatments / Results  Labs (all labs ordered are listed, but only abnormal results are displayed) Labs Reviewed  CBC WITH DIFFERENTIAL/PLATELET - Abnormal; Notable for the following components:      Result Value   Hemoglobin 9.5 (*)    HCT 34.4 (*)    MCH 22.4 (*)    MCHC 27.6 (*)    All other components within normal limits  BASIC METABOLIC PANEL - Abnormal; Notable for the following components:   Glucose, Bld 159 (*)    Creatinine, Ser 1.49 (*)    Calcium 8.4 (*)    GFR calc non Af Amer 34 (*)    GFR calc Af Amer 40 (*)    All other components within normal limits    EKG None  Radiology No results found.  Procedures Procedures (including critical care time)  Medications Ordered in ED Medications - No data to display   Initial Impression / Assessment and Plan / ED  Course  I have reviewed the triage vital signs and the nursing  notes.  Pertinent labs & imaging results that were available during my care of the patient were reviewed by me and considered in my medical decision making (see chart for details).    Discussed pt presentation and exam findings with DR. Zammit, who personally evaluated the patient agrees with the plan to d/c pt with outpt f/u.   Final Clinical Impressions(s) / ED Diagnoses   Final diagnoses:  Left leg pain   Patient presenting with left lower extremity pain, swelling and with a "knot "on the left shin.  Not has resolved since onset today and symptoms seem to be improving according to patient.  Her vitals are stable today she is afebrile.  She has very mild erythema to the left shin however does not appear consistent with cellulitis.  There is no palpable mass on exam though she did have some left calf tenderness.  Due to her reports of swelling, redness, and calf pain left lower extremity ultrasound was completed which was negative for DVT but did show a Baker's cyst to the posterior aspect of the left knee.  Do not feel that this is causing pain since symptoms of pain to the shin.  She does have some edema to the bilateral lower extremities that is chronic for her and I believe that this may be contributing to her symptoms.  She is on Lasix at home for her chronic lower extremity edema.  Advised her to keep taking this and follow-up with her PCP in regards to this.  Advised compression stockings and conservative management of her Baker's cyst.  Advised her to return to the ER for any new or worsening symptoms in the meantime.  All questions answered and patient is stable for discharge.  She voices an understanding of the plan.  ED Discharge Orders    None       Bishop Dublin 06/18/18 1947    Milton Ferguson, MD 06/21/18 [phone]

## 2018-07-02 ENCOUNTER — Telehealth: Payer: Self-pay

## 2018-07-02 NOTE — Telephone Encounter (Signed)
Left message for patient to call back, need to schedule [facility] appointment-Anastasiya V Hopkins, RMA

## 2018-08-19 ENCOUNTER — Other Ambulatory Visit: Payer: Self-pay | Admitting: Internal Medicine

## 2018-08-22 ENCOUNTER — Other Ambulatory Visit: Payer: Self-pay | Admitting: Physician Assistant

## 2018-08-26 ENCOUNTER — Other Ambulatory Visit: Payer: Self-pay | Admitting: Internal Medicine

## 2018-08-31 ENCOUNTER — Other Ambulatory Visit: Payer: Self-pay | Admitting: Internal Medicine

## 2018-08-31 DIAGNOSIS — E119 Type 2 diabetes mellitus without complications: Secondary | ICD-10-CM

## 2018-09-07 ENCOUNTER — Other Ambulatory Visit: Payer: Self-pay | Admitting: Cardiology

## 2018-09-08 ENCOUNTER — Other Ambulatory Visit: Payer: Self-pay | Admitting: Physician Assistant

## 2018-09-08 DIAGNOSIS — I10 Essential (primary) hypertension: Secondary | ICD-10-CM

## 2018-09-08 DIAGNOSIS — I25118 Atherosclerotic heart disease of native coronary artery with other forms of angina pectoris: Secondary | ICD-10-CM

## 2018-09-08 NOTE — Telephone Encounter (Signed)
Defer to PCP   I cannot follow diabetic prescriptions.

## 2018-09-08 NOTE — Telephone Encounter (Signed)
Please defer to PCP

## 2018-09-08 NOTE — Telephone Encounter (Signed)
Pt's pharmacy is requesting a refill on metformin. Would Dr. Harrington Challenger like to refill this medication? Please address

## 2018-09-16 ENCOUNTER — Ambulatory Visit: Payer: Medicare Other | Admitting: Physician Assistant

## 2018-09-16 ENCOUNTER — Other Ambulatory Visit: Payer: Self-pay

## 2018-09-16 ENCOUNTER — Encounter: Payer: Self-pay | Admitting: Physician Assistant

## 2018-09-16 VITALS — BP 136/74 | HR 71 | Ht 65.0 in | Wt 298.2 lb

## 2018-09-16 DIAGNOSIS — E7800 Pure hypercholesterolemia, unspecified: Secondary | ICD-10-CM

## 2018-09-16 DIAGNOSIS — I10 Essential (primary) hypertension: Secondary | ICD-10-CM | POA: Diagnosis not present

## 2018-09-16 DIAGNOSIS — E119 Type 2 diabetes mellitus without complications: Secondary | ICD-10-CM | POA: Diagnosis not present

## 2018-09-16 DIAGNOSIS — Z87898 Personal history of other specified conditions: Secondary | ICD-10-CM

## 2018-09-16 DIAGNOSIS — I2510 Atherosclerotic heart disease of native coronary artery without angina pectoris: Secondary | ICD-10-CM | POA: Diagnosis not present

## 2018-09-16 DIAGNOSIS — I25118 Atherosclerotic heart disease of native coronary artery with other forms of angina pectoris: Secondary | ICD-10-CM

## 2018-09-16 DIAGNOSIS — Z9861 Coronary angioplasty status: Secondary | ICD-10-CM

## 2018-09-16 MED ORDER — CARVEDILOL 25 MG PO TABS: 25.0000 mg | ORAL_TABLET | Freq: Two times a day (BID) | ORAL | 3 refills | Status: DC

## 2018-09-16 MED ORDER — DOXAZOSIN MESYLATE 4 MG PO TABS: 4.0000 mg | ORAL_TABLET | Freq: Every day | ORAL | 3 refills | Status: DC

## 2018-09-16 MED ORDER — FUROSEMIDE 20 MG PO TABS: 20.0000 mg | ORAL_TABLET | Freq: Every day | ORAL | 3 refills | Status: DC

## 2018-09-16 MED ORDER — LOSARTAN POTASSIUM 100 MG PO TABS: 100.0000 mg | ORAL_TABLET | Freq: Every day | ORAL | 3 refills | Status: DC

## 2018-09-16 MED ORDER — CLOPIDOGREL BISULFATE 75 MG PO TABS: 75.0000 mg | ORAL_TABLET | Freq: Every day | ORAL | 3 refills | Status: DC

## 2018-09-16 MED ORDER — ISOSORBIDE MONONITRATE ER 30 MG PO TB24: 30.0000 mg | ORAL_TABLET | Freq: Every day | ORAL | 3 refills | Status: DC

## 2018-09-16 MED ORDER — NITROGLYCERIN 0.4 MG SL SUBL: 0.4000 mg | SUBLINGUAL_TABLET | SUBLINGUAL | 3 refills | Status: DC | PRN

## 2018-09-16 MED ORDER — SPIRONOLACTONE 25 MG PO TABS: 25.0000 mg | ORAL_TABLET | Freq: Every day | ORAL | 3 refills | Status: DC

## 2018-09-16 MED ORDER — ATORVASTATIN CALCIUM 80 MG PO TABS: 80.0000 mg | ORAL_TABLET | Freq: Every day | ORAL | 3 refills | Status: DC

## 2018-09-16 NOTE — Progress Notes (Signed)
Cardiology Office Note    Date:  09/16/2018   ID:  [name], DOB 1946-05-16, MRN [id number]  PCP:  Jearld Fenton, NP  Cardiologist: [name], MD EPS: None  Chief Complaint  Patient presents with  . Follow-up    History of Present Illness:  Alicia [name] is a 72 y.o. female with history of CAD status post DES to the RCA 10/2015, repeat cath 03/18/2017 for chest pain DES to diagonal 1, 45% mid LAD treated with POBA, prior RCA stent was patent normal LVEF.  Also has hypertension, HLD and DM.  History of syncope 06/2017 CT MRI and carotid Dopplers were all normal.  Echo normal LVEF 65 to 70%.  30-day monitor showed normal sinus rhythm no arrhythmias detected.  Patient felt she was dehydrated.  Patient comes in for f/u. On a D-herb diet that is vegan for the past 30 days. Has lost over 40 lbs so far. No caffeine in the supplements. Walking 30 min/day no chest pain, shortness of breath. Much more energy.  Denies cardiac complaints.  Past Medical History:  Diagnosis Date  . Anemia   . Arthritis    "right knee" (10/21/2015)  . CAD (coronary artery disease)    a. Cath 10/21/15: s/p DES to RCA: moderate diffuse stenosis of mod diagonal, diffuse irregularity LCx and LAD. 2D echo 10/21/15: mod LVH, EF 60-65%, no RWMA, calcified MV, mod-severe LAE. 03/18/17 PCI with DES--> 1 diag  . Cataract   . Essential hypertension   . Family history of adverse reaction to anesthesia    "oldest sister, [name], related to brain formation at back of head; when they put her to sleep it's hard to wake her up"  . GERD (gastroesophageal reflux disease)   . History of blood transfusion    "related to ruptured tubal pregnancy"  . Hyperlipidemia   . Hypertensive heart disease   . Morbid obesity ([place])   . Type II diabetes mellitus ([place])     Past Surgical History:  Procedure Laterality Date  . ABDOMINAL HYSTERECTOMY  1988  . APPENDECTOMY  1985  . [place] SURGERY  1984  . CARDIAC CATHETERIZATION N/A  10/21/2015   Procedure: Right/Left Heart Cath and Coronary Angiography;  Surgeon: [name], MD;  Location: [facility];  Service: Cardiovascular;  Laterality: N/A;  . CHOLECYSTECTOMY OPEN  1984  . CORONARY STENT INTERVENTION N/A 03/18/2017   Procedure: Coronary Stent Intervention;  Surgeon: [name], MD;  Location: [facility];  Service: Cardiovascular;  Laterality: N/A;  . CORONARY STENT PLACEMENT  03/18/2017   A OPTIMIZE STUDY Drug Eluting Stent (2.5 mm 18 mm - post-dilated to 2.7 mm) was successfully placed  . ECTOPIC PREGNANCY SURGERY    . LEFT HEART CATH AND CORONARY ANGIOGRAPHY N/A 03/18/2017   Procedure: Left Heart Cath and Coronary Angiography;  Surgeon: [name], MD;  Location: [facility];  Service: Cardiovascular;  Laterality: N/A;  . REDUCTION MAMMAPLASTY Bilateral ~ 1976  . TONSILLECTOMY      Current Medications: Current Meds  Medication Sig  . acetaminophen (TYLENOL) 325 MG tablet Take 2 tablets (650 mg total) by mouth every 4 (four) hours as needed for headache or mild pain.  [name] aspirin 81 MG tablet Take 81 mg by mouth daily.  [name] atorvastatin (LIPITOR) 80 MG tablet Take 1 tablet (80 mg total) by mouth daily at 6 PM.  . clopidogrel (PLAVIX) 75 MG tablet Take 1 tablet (75 mg  total) by mouth daily.  . DiphenhydrAMINE HCl, Sleep, (UNISOM SLEEPGELS PO) Take 1 tablet by mouth at bedtime as needed (for sleep).   [name] doxazosin (CARDURA) 4 MG tablet Take 1 tablet (4 mg total) by mouth at bedtime.  . ferrous sulfate 325 (65 FE) MG tablet TAKE 1 TABLET BY MOUTH EVERY DAY  . furosemide (LASIX) 20 MG tablet Take 1 tablet (20 mg total) by mouth daily.  [name] glipiZIDE (GLUCOTROL) 10 MG tablet TAKE 1 TABLET (10 MG TOTAL) BY MOUTH 2 (TWO) TIMES DAILY BEFORE A MEAL.  [name] Insulin Pen Needle (B-D UF III MINI PEN NEEDLES) 31G X 5 MM MISC 1 each by Other route 2 (two) times daily as needed.  . isosorbide mononitrate (IMDUR) 30 MG 24 hr tablet Take 1 tablet (30 mg  total) by mouth daily.  [name] losartan (COZAAR) 100 MG tablet Take 1 tablet (100 mg total) by mouth daily.  . metFORMIN (GLUCOPHAGE) 1000 MG tablet TAKE 1 TABLET (1,000 MG TOTAL) BY MOUTH 2 (TWO) TIMES DAILY WITH A MEAL.  . Multiple Vitamins-Minerals (MULTIVITAMIN WITH MINERALS) tablet Take 1 tablet by mouth daily.  . nitroGLYCERIN (NITROSTAT) 0.4 MG SL tablet Place 1 tablet (0.4 mg total) under the tongue every 5 (five) minutes as needed for chest pain (up to 3 doses).  . ONE TOUCH ULTRA TEST test strip 1 EACH BY OTHER ROUTE 2 (TWO) TIMES DAILY.  [name] ONETOUCH DELICA LANCETS 43P MISC 1 each by Does not apply route 2 (two) times daily.  . sitaGLIPtin (JANUVIA) 100 MG tablet Take 1 tablet (100 mg total) by mouth daily.  [name] spironolactone (ALDACTONE) 25 MG tablet Take 1 tablet (25 mg total) by mouth daily.  . traMADol (ULTRAM) 50 MG tablet TAKE 1 TABLET (50 MG TOTAL) BY MOUTH EVERY 12 (TWELVE) HOURS AS NEEDED.  [name] VICTOZA 18 MG/3ML SOPN INJECT 0.2 MLS (1.2 MG TOTAL) INTO THE SKIN DAILY  . [DISCONTINUED] atorvastatin (LIPITOR) 80 MG tablet Take 1 tablet (80 mg total) by mouth daily at 6 PM. Please make overdue appt with Dr. Harrington Challenger before anymore refills. 1st attempt  . [DISCONTINUED] carvedilol (COREG) 25 MG tablet PLEASE SEE ATTACHED FOR DETAILED DIRECTIONS  . [DISCONTINUED] clopidogrel (PLAVIX) 75 MG tablet Take 1 tablet (75 mg total) by mouth daily. Please make overdue appt with Dr. Harrington Challenger before anymore refills. 2nd attempt  . [DISCONTINUED] doxazosin (CARDURA) 4 MG tablet TAKE 1 TABLET (4 MG TOTAL) BY MOUTH AT BEDTIME.  . [DISCONTINUED] furosemide (LASIX) 20 MG tablet Take 20 mg by mouth daily.  . [DISCONTINUED] isosorbide mononitrate (IMDUR) 30 MG 24 hr tablet Take 1 tablet (30 mg total) by mouth daily. CALL AND SCHEDULE FOLLOW UP OFFICE VISIT PLEASE. [phone] 1st attempt  . [DISCONTINUED] losartan (COZAAR) 100 MG tablet TAKE 1 TABLET BY MOUTH EVERY DAY  . [DISCONTINUED] nitroGLYCERIN (NITROSTAT) 0.4 MG  SL tablet Place 1 tablet (0.4 mg total) under the tongue every 5 (five) minutes as needed for chest pain (up to 3 doses).  . [DISCONTINUED] spironolactone (ALDACTONE) 25 MG tablet TAKE 1 TABLET BY MOUTH EVERY DAY     Allergies:   Iodinated diagnostic agents and Sulfa antibiotics   Social History   Socioeconomic History  . Marital status: Divorced    Spouse name: Not on file  . Number of children: 3  . Years of education: doctorate  . Highest education level: Not on file  Occupational History  . Occupation: Pharmacist, hospital  Social Needs  . Financial resource strain: Not on file  .  Food insecurity:    Worry: Not on file    Inability: Not on file  . Transportation needs:    Medical: Not on file    Non-medical: Not on file  Tobacco Use  . Smoking status: Never Smoker  . Smokeless tobacco: Never Used  Substance and Sexual Activity  . Alcohol use: No  . Drug use: No  . Sexual activity: Not Currently  Lifestyle  . Physical activity:    Days per week: Not on file    Minutes per session: Not on file  . Stress: Not on file  Relationships  . Social connections:    Talks on phone: Not on file    Gets together: Not on file    Attends religious service: Not on file    Active member of club or organization: Not on file    Attends meetings of clubs or organizations: Not on file    Relationship status: Not on file  Other Topics Concern  . Not on file  Social History Narrative   Caffeine use-yes   Regular exercise-no     Family History:  The patient's family history includes Diabetes in her mother, sister, sister, and sister; Heart attack in her father and mother; Heart disease in her mother; Hyperlipidemia in her father and mother; Hypertension in her father and mother; Stomach cancer in her sister; Stroke in her father.   ROS:   Please see the history of present illness.    Review of Systems  Constitution: Negative.  HENT: Negative.   Eyes: Negative.   Cardiovascular: Positive for  palpitations.  Respiratory: Negative.   Hematologic/Lymphatic: Bruises/bleeds easily.  Musculoskeletal: Negative.  Negative for joint pain.  Gastrointestinal: Negative.   Genitourinary: Negative.   Neurological: Negative.    All other systems reviewed and are negative.   PHYSICAL EXAM:   VS:  BP 136/74   Pulse 71   Ht 5\' 5"  (1.651 m)   Wt 298 lb 3.2 oz (135.3 kg)   BMI 49.62 kg/m   Physical Exam  GEN: Obese, in no acute distress  Neck: no JVD, carotid bruits, or masses Cardiac: Distant heart sounds RRR; no murmurs, rubs, or gallops  Respiratory:  clear to auscultation bilaterally, normal work of breathing GI: soft, nontender, nondistended, + BS Ext: Trace of ankle edema without cyanosis, clubbing,  Good distal pulses bilaterally Neuro:  Alert and Oriented x 3 Psych: euthymic mood, full affect  Wt Readings from Last 3 Encounters:  09/16/18 298 lb 3.2 oz (135.3 kg)  12/20/17 (!) 303 lb (137.4 kg)  11/29/17 (!) 308 lb (139.7 kg)      Studies/Labs Reviewed:   EKG:  EKG is ordered today.  The ekg ordered today demonstrates NSR, normal EKG  Recent Labs: 12/20/2017: ALT 15 06/18/2018: BUN 22; Creatinine, Ser 1.49; Hemoglobin 9.5; Platelets 179; Potassium 4.3; Sodium 140   Lipid Panel    Component Value Date/Time   CHOL 126 12/20/2017 1450   TRIG 50.0 12/20/2017 1450   HDL 33.80 (L) 12/20/2017 1450   CHOLHDL 4 12/20/2017 1450   VLDL 10.0 12/20/2017 1450   LDLCALC 82 12/20/2017 1450    Additional studies/ records that were reviewed today include:  30-day monitor 9/2018Study Highlights   Poor quality transmission on a few strips Sinus rhythm.  No arrhythmias detected      2D echo 8/27/2018Study Conclusions   - Left ventricle: The cavity size was normal. There was moderate   concentric hypertrophy. Systolic function was vigorous. The  estimated ejection fraction was in the range of 65% to 70%. Wall   motion was normal; there were no regional wall motion    abnormalities. Doppler parameters are consistent with abnormal   left ventricular relaxation (grade 1 diastolic dysfunction).   Doppler parameters are consistent with elevated ventricular   end-diastolic filling pressure. - Aortic valve: Trileaflet; mildly thickened, mildly calcified   leaflets. There was no regurgitation. - Aortic root: The aortic root was normal in size. - Mitral valve: Calcified annulus. Mildly thickened leaflets .   There was mild regurgitation. - Left atrium: The atrium was mildly dilated. - Right ventricle: The cavity size was normal. Wall thickness was   normal. Systolic function was normal. - Right atrium: The atrium was normal in size. - Tricuspid valve: There was mild regurgitation. - Pulmonic valve: There was no regurgitation. - Pulmonary arteries: Systolic pressure was within the normal   range. - Inferior vena cava: The vessel was normal in size. - Pericardium, extracardiac: There was no pericardial effusion.    Cardiac cath 4/30/2018Prod Diag 1 Lesion, 90 % (progression of disease from last cath 60%)  Mid RCA DES stentt 0 %stenosed.  A OPTIMIZE STUDY Drug Eluting Stent (2.5 mm 18 mm - post-dilated to 2.7 mm) was successfully placed.  Mid LAD lesion, 45 %stenosed. at prior PTCA site  Post intervention, there is a 0% residual stenosis.  LV end diastolic pressure is mildly elevated.      ASSESSMENT:    1. CAD S/P percutaneous coronary angioplasty   2. Essential hypertension   3. Pure hypercholesterolemia   4. Type 2 diabetes mellitus without complication, without long-term current use of insulin ([place])   5. History of syncope   6. Coronary artery disease involving native coronary artery of native heart with other form of angina pectoris ([place])      PLAN:  In order of problems listed above:  CAD status post DES to the RCA 10/2015, DES to the diagonal and POBA of the LAD 02/2017 patent RCA stent normal LV function.  Doing well without angina.   EKG today normal.  Continue Plavix aspirin and Lipitor  Essential hypertension blood pressure well controlled  Hypercholesterolemia LDL 82 12/20/2017-PCP does her blood work.  Hopefully with her weight loss her LDL is come down.  Goal LDL 70  DM type II managed by PCP  History of syncope 02/2017 felt to be secondary to dehydration.  No recurrence.  Morbid obesity patient is on not D herb diet that is vegan and she has lost 40 pounds.  She is exercising more and feels so much better.  Commended for her efforts and recommend continued exercise and weight loss.  Medication Adjustments/Labs and Tests Ordered: Current medicines are reviewed at length with the patient today.  Concerns regarding medicines are outlined above.  Medication changes, Labs and Tests ordered today are listed in the Patient Instructions below. Patient Instructions  Medication Instructions:  Your physician recommends that you continue on your current medications as directed. Please refer to the Current Medication list given to you today.  If you need a refill on your cardiac medications before your next appointment, please call your pharmacy.   Lab work: None ordered If you have labs (blood work) drawn today and your tests are completely normal, you will receive your results only by: [name] MyChart Message (if you have MyChart) OR . A paper copy in the mail If you have any lab test that is abnormal or we need to  change your treatment, we will call you to review the results.  Testing/Procedures: None ordered  Follow-Up: At [facility], you and your health needs are our priority.  As part of our continuing mission to provide you with exceptional heart care, we have created designated Provider Care Teams.  These Care Teams include your primary Cardiologist (physician) and Advanced Practice Providers (APPs -  Physician Assistants and Nurse Practitioners) who all work together to provide you with the care you need, when you  need it. You will need a follow up appointment in:  1 years.  Please call our office 2 months in advance to schedule this appointment.  You may see [name], MD or one of the following Advanced Practice Providers on your designated Care Team: Richardson Dopp, PA-C [place], [place] . Daune Perch, NP  Any Other Special Instructions Will Be Listed Below (If Applicable).       Sumner Boast, PA-C  09/16/2018 12:29 PM    [facility] [address], [place], [place]  [address] Phone: [phone]; Fax: [phone]

## 2018-09-16 NOTE — Patient Instructions (Signed)
Medication Instructions:  Your physician recommends that you continue on your current medications as directed. Please refer to the Current Medication list given to you today.  If you need a refill on your cardiac medications before your next appointment, please call your pharmacy.   Lab work: None ordered If you have labs (blood work) drawn today and your tests are completely normal, you will receive your results only by: [name] MyChart Message (if you have MyChart) OR . A paper copy in the mail If you have any lab test that is abnormal or we need to change your treatment, we will call you to review the results.  Testing/Procedures: None ordered  Follow-Up: At [facility], you and your health needs are our priority.  As part of our continuing mission to provide you with exceptional heart care, we have created designated Provider Care Teams.  These Care Teams include your primary Cardiologist (physician) and Advanced Practice Providers (APPs -  Physician Assistants and Nurse Practitioners) who all work together to provide you with the care you need, when you need it. You will need a follow up appointment in:  1 years.  Please call our office 2 months in advance to schedule this appointment.  You may see [name], MD or one of the following Advanced Practice Providers on your designated Care Team: Richardson Dopp, PA-C [place], [place] . Daune Perch, NP  Any Other Special Instructions Will Be Listed Below (If Applicable).

## 2018-11-21 ENCOUNTER — Other Ambulatory Visit: Payer: Self-pay | Admitting: Internal Medicine

## 2018-11-21 DIAGNOSIS — E1165 Type 2 diabetes mellitus with hyperglycemia: Secondary | ICD-10-CM

## 2018-12-01 ENCOUNTER — Other Ambulatory Visit: Payer: Self-pay | Admitting: Cardiology

## 2018-12-18 ENCOUNTER — Other Ambulatory Visit: Payer: Self-pay | Admitting: Physician Assistant

## 2019-01-18 ENCOUNTER — Inpatient Hospital Stay (HOSPITAL_COMMUNITY)
Admission: EM | Admit: 2019-01-18 | Discharge: 2019-01-22 | DRG: 062 | Disposition: A | Discharge status: Home Health | Payer: Medicare Other | Attending: Neurology | Admitting: Neurology

## 2019-01-18 ENCOUNTER — Emergency Department (HOSPITAL_COMMUNITY): Payer: Medicare Other

## 2019-01-18 ENCOUNTER — Encounter (HOSPITAL_COMMUNITY): Payer: Self-pay

## 2019-01-18 DIAGNOSIS — Z8249 Family history of ischemic heart disease and other diseases of the circulatory system: Secondary | ICD-10-CM

## 2019-01-18 DIAGNOSIS — E119 Type 2 diabetes mellitus without complications: Secondary | ICD-10-CM

## 2019-01-18 DIAGNOSIS — G4733 Obstructive sleep apnea (adult) (pediatric): Secondary | ICD-10-CM | POA: Diagnosis present

## 2019-01-18 DIAGNOSIS — D509 Iron deficiency anemia, unspecified: Secondary | ICD-10-CM | POA: Diagnosis present

## 2019-01-18 DIAGNOSIS — R29810 Facial weakness: Secondary | ICD-10-CM | POA: Diagnosis present

## 2019-01-18 DIAGNOSIS — Z9049 Acquired absence of other specified parts of digestive tract: Secondary | ICD-10-CM

## 2019-01-18 DIAGNOSIS — K219 Gastro-esophageal reflux disease without esophagitis: Secondary | ICD-10-CM | POA: Diagnosis present

## 2019-01-18 DIAGNOSIS — E785 Hyperlipidemia, unspecified: Secondary | ICD-10-CM | POA: Diagnosis present

## 2019-01-18 DIAGNOSIS — Z90710 Acquired absence of both cervix and uterus: Secondary | ICD-10-CM | POA: Diagnosis not present

## 2019-01-18 DIAGNOSIS — Z7982 Long term (current) use of aspirin: Secondary | ICD-10-CM

## 2019-01-18 DIAGNOSIS — E1165 Type 2 diabetes mellitus with hyperglycemia: Secondary | ICD-10-CM | POA: Diagnosis present

## 2019-01-18 DIAGNOSIS — E1122 Type 2 diabetes mellitus with diabetic chronic kidney disease: Secondary | ICD-10-CM | POA: Diagnosis present

## 2019-01-18 DIAGNOSIS — Z9884 Bariatric surgery status: Secondary | ICD-10-CM | POA: Diagnosis not present

## 2019-01-18 DIAGNOSIS — Z955 Presence of coronary angioplasty implant and graft: Secondary | ICD-10-CM

## 2019-01-18 DIAGNOSIS — Z6841 Body Mass Index (BMI) 40.0 and over, adult: Secondary | ICD-10-CM

## 2019-01-18 DIAGNOSIS — Z79899 Other long term (current) drug therapy: Secondary | ICD-10-CM

## 2019-01-18 DIAGNOSIS — Z823 Family history of stroke: Secondary | ICD-10-CM | POA: Diagnosis not present

## 2019-01-18 DIAGNOSIS — I161 Hypertensive emergency: Secondary | ICD-10-CM | POA: Diagnosis present

## 2019-01-18 DIAGNOSIS — I6300 Cerebral infarction due to thrombosis of unspecified precerebral artery: Secondary | ICD-10-CM | POA: Diagnosis not present

## 2019-01-18 DIAGNOSIS — I10 Essential (primary) hypertension: Secondary | ICD-10-CM | POA: Diagnosis present

## 2019-01-18 DIAGNOSIS — Z7902 Long term (current) use of antithrombotics/antiplatelets: Secondary | ICD-10-CM | POA: Diagnosis not present

## 2019-01-18 DIAGNOSIS — R29705 NIHSS score 5: Secondary | ICD-10-CM | POA: Diagnosis present

## 2019-01-18 DIAGNOSIS — I2510 Atherosclerotic heart disease of native coronary artery without angina pectoris: Secondary | ICD-10-CM

## 2019-01-18 DIAGNOSIS — Z794 Long term (current) use of insulin: Secondary | ICD-10-CM

## 2019-01-18 DIAGNOSIS — E1151 Type 2 diabetes mellitus with diabetic peripheral angiopathy without gangrene: Secondary | ICD-10-CM | POA: Diagnosis present

## 2019-01-18 DIAGNOSIS — R4781 Slurred speech: Secondary | ICD-10-CM | POA: Diagnosis present

## 2019-01-18 DIAGNOSIS — G8194 Hemiplegia, unspecified affecting left nondominant side: Secondary | ICD-10-CM | POA: Diagnosis present

## 2019-01-18 DIAGNOSIS — I1310 Hypertensive heart and chronic kidney disease without heart failure, with stage 1 through stage 4 chronic kidney disease, or unspecified chronic kidney disease: Secondary | ICD-10-CM | POA: Diagnosis present

## 2019-01-18 DIAGNOSIS — I639 Cerebral infarction, unspecified: Secondary | ICD-10-CM | POA: Diagnosis not present

## 2019-01-18 DIAGNOSIS — N189 Chronic kidney disease, unspecified: Secondary | ICD-10-CM | POA: Diagnosis present

## 2019-01-18 DIAGNOSIS — I6350 Cerebral infarction due to unspecified occlusion or stenosis of unspecified cerebral artery: Secondary | ICD-10-CM | POA: Diagnosis not present

## 2019-01-18 DIAGNOSIS — Z9861 Coronary angioplasty status: Secondary | ICD-10-CM

## 2019-01-18 DIAGNOSIS — E66813 Obesity, class 3: Secondary | ICD-10-CM | POA: Diagnosis present

## 2019-01-18 LAB — COMPREHENSIVE METABOLIC PANEL
ALT: 11 U/L (ref 0–44)
AST: 16 U/L (ref 15–41)
Albumin: 3.7 g/dL (ref 3.5–5.0)
Alkaline Phosphatase: 116 U/L (ref 38–126)
Anion gap: 9 (ref 5–15)
BUN: 16 mg/dL (ref 8–23)
CO2: 27 mmol/L (ref 22–32)
Calcium: 8.8 mg/dL — ABNORMAL LOW (ref 8.9–10.3)
Chloride: 104 mmol/L (ref 98–111)
Creatinine, Ser: 1.8 mg/dL — ABNORMAL HIGH (ref 0.44–1.00)
GFR calc Af Amer: 32 mL/min — ABNORMAL LOW (ref >60)
GFR calc non Af Amer: 28 mL/min — ABNORMAL LOW (ref >60)
Glucose, Bld: 134 mg/dL — ABNORMAL HIGH (ref 70–99)
POTASSIUM: 4.1 mmol/L (ref 3.5–5.1)
Sodium: 140 mmol/L (ref 135–145)
Total Bilirubin: 0.7 mg/dL (ref 0.3–1.2)
Total Protein: 6.5 g/dL (ref 6.5–8.1)

## 2019-01-18 LAB — DIFFERENTIAL
Abs Immature Granulocytes: 0.01 10*3/uL (ref 0.00–0.07)
Basophils Absolute: 0 10*3/uL (ref 0.0–0.1)
Basophils Relative: 1 %
Eosinophils Absolute: 0.1 10*3/uL (ref 0.0–0.5)
Eosinophils Relative: 3 %
Immature Granulocytes: 0 %
Lymphocytes Relative: 45 %
Lymphs Abs: 2.1 10*3/uL (ref 0.7–4.0)
Monocytes Absolute: 0.5 10*3/uL (ref 0.1–1.0)
Monocytes Relative: 10 %
NEUTROS ABS: 1.9 10*3/uL (ref 1.7–7.7)
NEUTROS PCT: 41 %

## 2019-01-18 LAB — I-STAT CREATININE, ED: Creatinine, Ser: 1.8 mg/dL — ABNORMAL HIGH (ref 0.44–1.00)

## 2019-01-18 LAB — CBC
HCT: 34.1 % — ABNORMAL LOW (ref 36.0–46.0)
Hemoglobin: 9.8 g/dL — ABNORMAL LOW (ref 12.0–15.0)
MCH: 21.6 pg — ABNORMAL LOW (ref 26.0–34.0)
MCHC: 28.7 g/dL — ABNORMAL LOW (ref 30.0–36.0)
MCV: 75.1 fL — ABNORMAL LOW (ref 80.0–100.0)
Platelets: 231 10*3/uL (ref 150–400)
RBC: 4.54 MIL/uL (ref 3.87–5.11)
RDW: 16.1 % — ABNORMAL HIGH (ref 11.5–15.5)
WBC: 4.6 10*3/uL (ref 4.0–10.5)
nRBC: 0 % (ref 0.0–0.2)

## 2019-01-18 LAB — I-STAT TROPONIN, ED: Troponin i, poc: 0 ng/mL (ref 0.00–0.08)

## 2019-01-18 LAB — MRSA PCR SCREENING: MRSA by PCR: NEGATIVE

## 2019-01-18 LAB — APTT: APTT: 34 s (ref 24–36)

## 2019-01-18 LAB — PROTIME-INR
INR: 1.1 (ref 0.8–1.2)
Prothrombin Time: 14 seconds (ref 11.4–15.2)

## 2019-01-18 LAB — GLUCOSE, CAPILLARY: Glucose-Capillary: 211 mg/dL — ABNORMAL HIGH (ref 70–99)

## 2019-01-18 MED ORDER — CLEVIDIPINE BUTYRATE 0.5 MG/ML IV EMUL
0.0000 mg/h | INTRAVENOUS | Status: DC
  Administered 2019-01-18: 1 mg/h via INTRAVENOUS
  Administered 2019-01-19 (×2): 8 mg/h via INTRAVENOUS
  Administered 2019-01-19: 12 mg/h via INTRAVENOUS
  Administered 2019-01-19: 6 mg/h via INTRAVENOUS
  Filled 2019-01-18 (×5): qty 50

## 2019-01-18 MED ORDER — IOHEXOL 350 MG/ML SOLN
75.0000 mL | Freq: Once | INTRAVENOUS | Status: AC | PRN
  Administered 2019-01-18: 75 mL via INTRAVENOUS

## 2019-01-18 MED ORDER — SODIUM CHLORIDE 0.9 % IV SOLN
INTRAVENOUS | Status: DC
  Administered 2019-01-18 – 2019-01-19 (×2): via INTRAVENOUS
  Administered 2019-01-20: 1000 mL via INTRAVENOUS
  Administered 2019-01-21 (×3): via INTRAVENOUS

## 2019-01-18 MED ORDER — SENNOSIDES-DOCUSATE SODIUM 8.6-50 MG PO TABS: 1.0000 | ORAL_TABLET | Freq: Every evening | ORAL | Status: DC | PRN

## 2019-01-18 MED ORDER — ACETAMINOPHEN 325 MG PO TABS: 650.0000 mg | ORAL_TABLET | ORAL | Status: DC | PRN

## 2019-01-18 MED ORDER — DIPHENHYDRAMINE HCL 50 MG/ML IJ SOLN
25.0000 mg | Freq: Once | INTRAMUSCULAR | Status: AC
  Administered 2019-01-18: 25 mg via INTRAVENOUS
  Filled 2019-01-18: qty 1

## 2019-01-18 MED ORDER — ALTEPLASE (STROKE) FULL DOSE INFUSION
90.0000 mg | Freq: Once | INTRAVENOUS | Status: AC
  Administered 2019-01-18: 90 mg via INTRAVENOUS

## 2019-01-18 MED ORDER — SODIUM CHLORIDE 0.9% FLUSH: 3.0000 mL | Freq: Once | INTRAVENOUS | Status: DC

## 2019-01-18 MED ORDER — ACETAMINOPHEN 650 MG RE SUPP: 650.0000 mg | RECTAL | Status: DC | PRN

## 2019-01-18 MED ORDER — METHYLPREDNISOLONE SODIUM SUCC 125 MG IJ SOLR
125.0000 mg | Freq: Once | INTRAMUSCULAR | Status: AC
  Administered 2019-01-18: 125 mg via INTRAVENOUS
  Filled 2019-01-18: qty 2

## 2019-01-18 MED ORDER — ACETAMINOPHEN 160 MG/5ML PO SOLN: 650.0000 mg | ORAL | Status: DC | PRN

## 2019-01-18 MED ORDER — INSULIN ASPART 100 UNIT/ML ~~LOC~~ SOLN
0.0000 [IU] | Freq: Three times a day (TID) | SUBCUTANEOUS | Status: DC
  Administered 2019-01-19 (×2): 11 [IU] via SUBCUTANEOUS
  Administered 2019-01-19: 7 [IU] via SUBCUTANEOUS
  Administered 2019-01-20: 4 [IU] via SUBCUTANEOUS
  Administered 2019-01-20: 7 [IU] via SUBCUTANEOUS
  Administered 2019-01-21: 4 [IU] via SUBCUTANEOUS
  Administered 2019-01-21: 7 [IU] via SUBCUTANEOUS
  Administered 2019-01-22: 3 [IU] via SUBCUTANEOUS

## 2019-01-18 MED ORDER — LABETALOL HCL 5 MG/ML IV SOLN
10.0000 mg | Freq: Once | INTRAVENOUS | Status: AC
  Administered 2019-01-18: 10 mg via INTRAVENOUS
  Filled 2019-01-18: qty 4

## 2019-01-18 MED ORDER — PANTOPRAZOLE SODIUM 40 MG IV SOLR
40.0000 mg | Freq: Every day | INTRAVENOUS | Status: DC
  Administered 2019-01-18 – 2019-01-19 (×2): 40 mg via INTRAVENOUS
  Filled 2019-01-18 (×2): qty 40

## 2019-01-18 MED ORDER — STROKE: EARLY STAGES OF RECOVERY BOOK
Freq: Once | Status: AC
  Administered 2019-01-20: 10:00:00

## 2019-01-18 MED ORDER — SODIUM CHLORIDE 0.9 % IV SOLN: 50.0000 mL | Freq: Once | INTRAVENOUS | Status: DC

## 2019-01-18 NOTE — ED Triage Notes (Signed)
Per [facility], LSN 1730, 20 mins pta pt had sudden onset of left side weakness, slurred speech, facial droop, slow responses. Pt still appears in this state on arrival. Assessed by Neurologist and taken to CT.

## 2019-01-18 NOTE — H&P (Addendum)
NEURO HOSPITALIST  H&P   Requesting Physician: Dr. Tamera Punt    Chief Complaint:  Left side weakness and slurred speech  History obtained from:  Patient    HPI:                                                                                                                                         Alicia [name] is an 73 y.o. female  With PMH DM 2, HTN, HLD, CAD, obesity who presented to [facility] as a code stroke with c/o left side weakness and slurred speech.   No prior stroke history. according to EMS report today at 1720 patient was with her family when she had a sudden onset of slurred speech and left side weakness.  Family also reports some left facial droop. EMS was called, blood pressure systolic in the 333L, CBG within normal ranges-not hypoglycemic Endorses taking ASA and Plavix. Did have a fall last week, no bleedings, no headaches after that.. Denies any CP, SOB, vision changes.   ED course:  CTH: no hemorrhage BP: 190   Wt: 135.5 KG Date last known well: 01-18-2019 Time last known well: 1730 tPA Given: Yes ; started @ 1839 Labetalol given for SBP 190 Cleviprex ordered to bedside Modified Rankin: Rankin Score=0 NIHSS:5  Past Medical History:  Diagnosis Date  . Anemia   . Arthritis    "right knee" (10/21/2015)  . CAD (coronary artery disease)    a. Cath 10/21/15: s/p DES to RCA: moderate diffuse stenosis of mod diagonal, diffuse irregularity LCx and LAD. 2D echo 10/21/15: mod LVH, EF 60-65%, no RWMA, calcified MV, mod-severe LAE. 03/18/17 PCI with DES--> 1 diag  . Cataract   . Essential hypertension   . Family history of adverse reaction to anesthesia    "oldest sister, [name], related to brain formation at back of head; when they put her to sleep it's hard to wake her up"  . GERD (gastroesophageal reflux disease)   . History of blood transfusion    "related to ruptured tubal pregnancy"  . Hyperlipidemia   . Hypertensive heart  disease   . Morbid obesity ([place])   . Type II diabetes mellitus ([place])     Past Surgical History:  Procedure Laterality Date  . ABDOMINAL HYSTERECTOMY  1988  . APPENDECTOMY  1985  . [place] SURGERY  1984  . CARDIAC CATHETERIZATION N/A 10/21/2015   Procedure: Right/Left Heart Cath and Coronary Angiography;  Surgeon: [name], MD;  Location: [place] [facility];  Service: Cardiovascular;  Laterality: N/A;  . CHOLECYSTECTOMY OPEN  1984  . CORONARY STENT INTERVENTION N/A 03/18/2017  Procedure: Coronary Stent Intervention;  Surgeon: [name], MD;  Location: [place] [facility];  Service: Cardiovascular;  Laterality: N/A;  . CORONARY STENT PLACEMENT  03/18/2017   A OPTIMIZE STUDY Drug Eluting Stent (2.5 mm 18 mm - post-dilated to 2.7 mm) was successfully placed  . ECTOPIC PREGNANCY SURGERY    . LEFT HEART CATH AND CORONARY ANGIOGRAPHY N/A 03/18/2017   Procedure: Left Heart Cath and Coronary Angiography;  Surgeon: [name], MD;  Location: [name] [facility];  Service: Cardiovascular;  Laterality: N/A;  . REDUCTION MAMMAPLASTY Bilateral ~ 1976  . TONSILLECTOMY      Family History  Problem Relation Age of Onset  . Diabetes Mother   . Heart attack Mother        2007  . Hypertension Mother   . Hyperlipidemia Mother   . Heart disease Mother   . Heart attack Father   . Stroke Father   . Hypertension Father   . Hyperlipidemia Father   . Stomach cancer Sister   . Diabetes Sister   . Diabetes Sister   . Diabetes Sister          Social History:  reports that she has never smoked. She has never used smokeless tobacco. She reports that she does not drink alcohol or use drugs.  Allergies:  Allergies  Allergen Reactions  . Iodinated Diagnostic Agents Hives  . Sulfa Antibiotics Hives    Medications:                                                                                                                          Current Facility-Administered Medications  Medication  Dose Route Frequency Provider Last Rate Last Dose  . sodium chloride flush (NS) 0.9 % injection 3 mL  3 mL Intravenous Once Malvin Johns, MD       Current Outpatient Medications  Medication Sig Dispense Refill  . acetaminophen (TYLENOL) 325 MG tablet Take 2 tablets (650 mg total) by mouth every 4 (four) hours as needed for headache or mild pain.    [name] aspirin 81 MG tablet Take 81 mg by mouth daily.    [name] atorvastatin (LIPITOR) 80 MG tablet Take 1 tablet (80 mg total) by mouth daily at 6 PM. 90 tablet 3  . carvedilol (COREG) 25 MG tablet Take 1 tablet (25 mg total) by mouth 2 (two) times daily. 180 tablet 3  . clopidogrel (PLAVIX) 75 MG tablet Take 1 tablet (75 mg total) by mouth daily. 90 tablet 3  . DiphenhydrAMINE HCl, Sleep, (UNISOM SLEEPGELS PO) Take 1 tablet by mouth at bedtime as needed (for sleep).     [name] doxazosin (CARDURA) 4 MG tablet Take 1 tablet (4 mg total) by mouth at bedtime. 90 tablet 3  . ferrous sulfate 325 (65 FE) MG tablet TAKE 1 TABLET BY MOUTH EVERY DAY 90 tablet 0  . furosemide (LASIX) 20 MG tablet Take 1 tablet (20 mg total) by mouth daily. 90 tablet  3  . glipiZIDE (GLUCOTROL) 10 MG tablet TAKE 1 TABLET (10 MG TOTAL) BY MOUTH 2 (TWO) TIMES DAILY BEFORE A MEAL. 180 tablet 0  . Insulin Pen Needle (B-D UF III MINI PEN NEEDLES) 31G X 5 MM MISC 1 each by Other route 2 (two) times daily as needed. 100 each 1  . isosorbide mononitrate (IMDUR) 30 MG 24 hr tablet Take 1 tablet (30 mg total) by mouth daily. 90 tablet 3  . losartan (COZAAR) 100 MG tablet Take 1 tablet (100 mg total) by mouth daily. 90 tablet 3  . metFORMIN (GLUCOPHAGE) 1000 MG tablet TAKE 1 TABLET (1,000 MG TOTAL) BY MOUTH 2 (TWO) TIMES DAILY WITH A MEAL. 180 tablet 2  . Multiple Vitamins-Minerals (MULTIVITAMIN WITH MINERALS) tablet Take 1 tablet by mouth daily.    . nitroGLYCERIN (NITROSTAT) 0.4 MG SL tablet PLACE 1 TABLET (0.4 MG TOTAL) UNDER THE TONGUE EVERY 5 (FIVE) MINUTES AS NEEDED FOR CHEST PAIN (UP TO 3  DOSES). 75 tablet 0  . ONE TOUCH ULTRA TEST test strip 1 EACH BY OTHER ROUTE 2 (TWO) TIMES DAILY. 200 each 1  . ONETOUCH DELICA LANCETS 00Q MISC 1 each by Does not apply route 2 (two) times daily. 100 each 2  . sitaGLIPtin (JANUVIA) 100 MG tablet Take 1 tablet (100 mg total) by mouth daily. 90 tablet 0  . spironolactone (ALDACTONE) 25 MG tablet Take 1 tablet (25 mg total) by mouth daily. 90 tablet 3  . traMADol (ULTRAM) 50 MG tablet TAKE 1 TABLET (50 MG TOTAL) BY MOUTH EVERY 12 (TWELVE) HOURS AS NEEDED. 30 tablet 0  . VICTOZA 18 MG/3ML SOPN INJECT 0.2 MLS (1.2 MG TOTAL) INTO THE SKIN DAILY 6 pen 0   ROS:                                                                                                                                       ROS was performed and is negative except as noted in HPI General Examination:                                                                                                      There were no vitals taken for this visit.  HEENT-  Normocephalic, no lesions, without obvious abnormality.  Normal external eye and conjunctiva. Cardiovascular- S1-S2 audible, pulses palpable throughout  Lungs-no rhonchi or wheezing noted, no excessive working breathing.  Saturations within normal limits on RA Extremities- Warm, dry and intact Musculoskeletal-no joint tenderness, deformity  or swelling Skin-warm and dry, no hyperpigmentation, vitiligo, or suspicious lesions  Neurological Examination  Patient is awake alert oriented x3. Her speech has a stuttering quality but not grossly dysarthric. She is not aphasic and was able to name all simple objects clearly. She is fluent. Comprehension intact Cranial nerves: Pupils equal round react light, extraocular muscles are, visual fields full, face is symmetric, tongue midline, palate midline. Motor exam: Left upper extremity is at least a 4/5 with mild vertical drift.  Left lower extremity is 3/5 and falls down to the bed right away.   Right upper and lower extremity strength 5/5. Sensory exam: Decreased to touch on the left hemibody (feels 10% of normal sensation on the left) Coordination: Intact finger-nose-finger on the right, drainage on the left due to weakness. Gait testing was deferred at this time 1a Level of Conscious.: 0 1b LOC Questions: 0 1c LOC Commands: 0 2 Best Gaze: 0 3 Visual: 0 4 Facial Palsy: 0 5a Motor Arm - left: 1 5b Motor Arm - Right: 0 6a Motor Leg - Left: 2 6b Motor Leg - Right: 0 7 Limb Ataxia: 0 8 Sensory: 1 9 Best Language: 0 10 Dysarthria: 1 11 Extinct. and Inatten.:  0 TOTAL: 5  Lab Results: Basic Metabolic Panel: Recent Labs  Lab 01/18/19 1838  CREATININE 1.80*    CBC: Recent Labs  Lab 01/18/19 1820  WBC 4.6  NEUTROABS 1.9  HGB 9.8*  HCT 34.1*  MCV 75.1*  PLT 231    CBG: No results for input(s): GLUCAP in the last 168 hours.  Imaging: No results found.     Laurey Morale, MSN, NP-C [facility] [phone]  01/18/2019, 6:24 PM   Attending physician note to follow with Assessment and plan .  Attending addendum Patient coming from home for sudden onset of left-sided weakness slurred speech at 5:20 PM. Report of fall last week without any obvious external bleeding.  No headaches or neurological symptoms following that. Denies any prior history of strokes.  Denies any chest pain shortness of breath nausea vomiting. On examination, NIH stroke scale 5 for left-sided weakness.  There is some stuttering to her speech, which raises some concern for this being nonorganic but symptoms of unilateral weakness are disabling at this time. I have independently reviewed imaging.  CT head with no acute changes.  CTA head and neck preliminary review shows no emergent LVO.  Assessment: Alicia [name] is an 73 y.o. female  With PMH DM 2, HTN, HLD, CAD, obesity who presented to [facility] ED as a code stroke with c/o left side weakness and slurred speech. TPA: was given,  CTH: no hemorrhage, CTA Discussed IV TPA given NIH stroke scale 5 and disabling symptoms.  Discussed risks of bleeding with IV TPA.  Patient in agreement to use IV TPA. TPA was given after blood pressure was brought down by using IV labetalol x1. She will be admitted to the ICU for stroke work-up.   She received TPA due to being in the time window and disabling symptoms. Not a candidate for EVT due to no cortical signs on exam.  Awaiting formal read of CTA.  Primary review shows no emergent LVO.  Impression Likely small vessel etiology stroke-status post TPA given in the emergency room. Diabetes with hyperglycemia. Hypertensive emergency Hyperlipidemia Coronary disease Obesity  Stroke Risk Factors - diabetes mellitus, hyperlipidemia and hypertension   Recommendations: --Post TPA protocol --BP 180/162mmHg, no antiplatelets or anticoagulants for at least 24 hours post  TPA.[name] --MRI Brain no contrast --Echocardiogram --High intensity Statin if LDL --HgbA1c, fasting lipid panel --PT consult, OT consult, Speech consult --Telemetry monitoring --Frequent neuro checks --Stroke swallow screen --n.p.o. until cleared. --Sliding scale insulin for diabetes --Urinalysis, chest x-ray --Likely iron deficiency anemia.  Monitor CBC.  Transfuse for hemoglobin less than 7 --Chronic kidney disease- gentle hydration.  Repeat BMP.  --please page stroke NP  Or  PA  Or MD from 8am -4 pm  as this patient from this time will be  followed by the stroke.   You can look them up on [email or web address]  Password [email or web address]  -- Amie Portland, MD [facility] Pager: [phone] If 7pm to 7am, please call on call as listed on AMION.  Present on arrival Left hemiparesis, possible UTI, coronary disease, essential hypertension, hypertensive emergency  CRITICAL CARE ATTESTATION Performed by: Amie Portland, MD Total critical care time: 45 minutes Critical care time was exclusive of separately billable procedures  and treating other patients and/or supervising APPs/Residents/Students Critical care was necessary to treat or prevent imminent or life-threatening deterioration due to acute ischemic stroke This patient is critically ill and at significant risk for neurological worsening and/or death and care requires constant monitoring. Critical care was time spent personally by me on the following activities: development of treatment plan with patient and/or surrogate as well as nursing, discussions with consultants, evaluation of patient's response to treatment, examination of patient, obtaining history from patient or surrogate, ordering and performing treatments and interventions, ordering and review of laboratory studies, ordering and review of radiographic studies, pulse oximetry, re-evaluation of patient's condition, participation in multidisciplinary rounds and medical decision making of high complexity in the care of this patient.

## 2019-01-18 NOTE — ED Provider Notes (Signed)
[facility] EMERGENCY DEPARTMENT Provider Note   CSN: [id number] Arrival date & time: 01/18/19  1817    History   Chief Complaint Chief Complaint  Patient presents with  . Code Stroke    HPI Alicia [name] is a 73 y.o. female.     Patient is a 73 year old female with a history of diabetes, hypertension, hyperlipidemia and coronary artery disease who presents as a code stroke.  She was last seen normal at 530 at which point she started having left side arm and leg weakness with facial drooping and slurred speech per family.  Per EMS, she also had a recent fall couple days ago.  She is on Plavix but no other known blood thinners.     Past Medical History:  Diagnosis Date  . Anemia   . Arthritis    "right knee" (10/21/2015)  . CAD (coronary artery disease)    a. Cath 10/21/15: s/p DES to RCA: moderate diffuse stenosis of mod diagonal, diffuse irregularity LCx and LAD. 2D echo 10/21/15: mod LVH, EF 60-65%, no RWMA, calcified MV, mod-severe LAE. 03/18/17 PCI with DES--> 1 diag  . Cataract   . Essential hypertension   . Family history of adverse reaction to anesthesia    "oldest sister, [name], related to brain formation at back of head; when they put her to sleep it's hard to wake her up"  . GERD (gastroesophageal reflux disease)   . History of blood transfusion    "related to ruptured tubal pregnancy"  . Hyperlipidemia   . Hypertensive heart disease   . Morbid obesity ([place])   . Type II diabetes mellitus [facility])     Patient Active Problem List   Diagnosis Date Noted  . Stroke ([place]) 01/18/2019  . History of syncope 09/16/2018  . OA (osteoarthritis) of knee 12/20/2017  . Type 2 diabetes mellitus without complication, without long-term current use of insulin ([place]) 12/20/2017  . Progressive angina ([place])   . CAD S/P percutaneous coronary angioplasty 10/22/2015  . Essential hypertension 10/22/2015  . Microcytic anemia 10/22/2015  . Morbid obesity ([place]) 10/22/2015   . Hyperlipidemia 10/09/2012  . GERD (gastroesophageal reflux disease) 10/09/2012    Past Surgical History:  Procedure Laterality Date  . ABDOMINAL HYSTERECTOMY  1988  . APPENDECTOMY  1985  . [place] SURGERY  1984  . CARDIAC CATHETERIZATION N/A 10/21/2015   Procedure: Right/Left Heart Cath and Coronary Angiography;  Surgeon: [name], MD;  Location: [facility] CV LAB;  Service: Cardiovascular;  Laterality: N/A;  . CHOLECYSTECTOMY OPEN  1984  . CORONARY STENT INTERVENTION N/A 03/18/2017   Procedure: Coronary Stent Intervention;  Surgeon: [name], MD;  Location: [facility];  Service: Cardiovascular;  Laterality: N/A;  . CORONARY STENT PLACEMENT  03/18/2017   A OPTIMIZE STUDY Drug Eluting Stent (2.5 mm 18 mm - post-dilated to 2.7 mm) was successfully placed  . ECTOPIC PREGNANCY SURGERY    . LEFT HEART CATH AND CORONARY ANGIOGRAPHY N/A 03/18/2017   Procedure: Left Heart Cath and Coronary Angiography;  Surgeon: [name], MD;  Location: [facility];  Service: Cardiovascular;  Laterality: N/A;  . REDUCTION MAMMAPLASTY Bilateral ~ 1976  . TONSILLECTOMY       OB History   No obstetric history on file.      Home Medications    Prior to Admission medications   Medication Sig Start Date End Date Taking? Authorizing Provider  acetaminophen (TYLENOL) 325 MG tablet Take 2 tablets (650  mg total) by mouth every 4 (four) hours as needed for headache or mild pain. 03/19/17   Erlene Quan, PA-C  aspirin 81 MG tablet Take 81 mg by mouth daily.    [provider]  atorvastatin (LIPITOR) 80 MG tablet Take 1 tablet (80 mg total) by mouth daily at 6 PM. 09/16/18   Imogene Burn, PA-C  carvedilol (COREG) 25 MG tablet Take 1 tablet (25 mg total) by mouth 2 (two) times daily. 09/16/18   Imogene Burn, PA-C  clopidogrel (PLAVIX) 75 MG tablet Take 1 tablet (75 mg total) by mouth daily. 09/16/18   Imogene Burn, PA-C  DiphenhydrAMINE HCl, Sleep, (UNISOM  SLEEPGELS PO) Take 1 tablet by mouth at bedtime as needed (for sleep).     [provider]  doxazosin (CARDURA) 4 MG tablet Take 1 tablet (4 mg total) by mouth at bedtime. 09/16/18   Imogene Burn, PA-C  ferrous sulfate 325 (65 FE) MG tablet TAKE 1 TABLET BY MOUTH EVERY DAY 11/21/18   Jearld Fenton, NP  furosemide (LASIX) 20 MG tablet Take 1 tablet (20 mg total) by mouth daily. 09/16/18   Imogene Burn, PA-C  glipiZIDE (GLUCOTROL) 10 MG tablet TAKE 1 TABLET (10 MG TOTAL) BY MOUTH 2 (TWO) TIMES DAILY BEFORE A MEAL. 11/21/18   Jearld Fenton, NP  Insulin Pen Needle (B-D UF III MINI PEN NEEDLES) 31G X 5 MM MISC 1 each by Other route 2 (two) times daily as needed. 09/06/17   Jearld Fenton, NP  isosorbide mononitrate (IMDUR) 30 MG 24 hr tablet Take 1 tablet (30 mg total) by mouth daily. 09/16/18   Imogene Burn, PA-C  losartan (COZAAR) 100 MG tablet Take 1 tablet (100 mg total) by mouth daily. 09/16/18   Imogene Burn, PA-C  metFORMIN (GLUCOPHAGE) 1000 MG tablet TAKE 1 TABLET (1,000 MG TOTAL) BY MOUTH 2 (TWO) TIMES DAILY WITH A MEAL. 12/02/18   [name], MD  Multiple Vitamins-Minerals (MULTIVITAMIN WITH MINERALS) tablet Take 1 tablet by mouth daily.    [provider]  nitroGLYCERIN (NITROSTAT) 0.4 MG SL tablet PLACE 1 TABLET (0.4 MG TOTAL) UNDER THE TONGUE EVERY 5 (FIVE) MINUTES AS NEEDED FOR CHEST PAIN (UP TO 3 DOSES). 12/18/18   Imogene Burn, PA-C  ONE TOUCH ULTRA TEST test strip 1 EACH BY OTHER ROUTE 2 (TWO) TIMES DAILY. 09/01/18   Jearld Fenton, NP  ONETOUCH DELICA LANCETS 81X MISC 1 each by Does not apply route 2 (two) times daily. 12/19/17   Jearld Fenton, NP  sitaGLIPtin (JANUVIA) 100 MG tablet Take 1 tablet (100 mg total) by mouth daily. 03/26/18   Jearld Fenton, NP  spironolactone (ALDACTONE) 25 MG tablet Take 1 tablet (25 mg total) by mouth daily. 09/16/18   Imogene Burn, PA-C  traMADol (ULTRAM) 50 MG tablet TAKE 1 TABLET (50 MG TOTAL) BY MOUTH EVERY  12 (TWELVE) HOURS AS NEEDED. 02/27/18   Jearld Fenton, NP  VICTOZA 18 MG/3ML SOPN INJECT 0.2 MLS (1.2 MG TOTAL) INTO THE SKIN DAILY 01/08/18   Jearld Fenton, NP    Family History Family History  Problem Relation Age of Onset  . Diabetes Mother   . Heart attack Mother        2007  . Hypertension Mother   . Hyperlipidemia Mother   . Heart disease Mother   . Heart attack Father   . Stroke Father   . Hypertension Father   .  Hyperlipidemia Father   . Stomach cancer Sister   . Diabetes Sister   . Diabetes Sister   . Diabetes Sister     Social History Social History   Tobacco Use  . Smoking status: Never Smoker  . Smokeless tobacco: Never Used  Substance Use Topics  . Alcohol use: No  . Drug use: No     Allergies   Iodinated diagnostic agents and Sulfa antibiotics   Review of Systems Review of Systems  Unable to perform ROS: Acuity of condition     Physical Exam Updated Vital Signs BP (!) 168/83   Pulse 82   Temp 98.3 F (36.8 C) (Oral)   Resp (!) 21   Ht 5\' 5"  (1.651 m)   Wt 135.5 kg   SpO2 100%   BMI 49.71 kg/m   Physical Exam Constitutional:      Appearance: She is well-developed.  HENT:     Head: Normocephalic and atraumatic.  Eyes:     Pupils: Pupils are equal, round, and reactive to light.  Neck:     Musculoskeletal: Normal range of motion and neck supple.  Cardiovascular:     Rate and Rhythm: Normal rate and regular rhythm.     Heart sounds: Normal heart sounds.  Pulmonary:     Effort: Pulmonary effort is normal. No respiratory distress.     Breath sounds: Normal breath sounds. No wheezing or rales.  Chest:     Chest wall: No tenderness.  Abdominal:     General: Bowel sounds are normal.     Palpations: Abdomen is soft.     Tenderness: There is no abdominal tenderness. There is no guarding or rebound.  Musculoskeletal: Normal range of motion.  Lymphadenopathy:     Cervical: No cervical adenopathy.  Skin:    General: Skin is warm and  dry.     Findings: No rash.  Neurological:     Mental Status: She is alert and oriented to person, place, and time.     Comments: Patient is awake with eyes open.  She does have some slurred speech.  No visible facial drooping.  She does have some weakness in her left arm and left leg as compared to the right side.  She has some diminished sensation to light touch in these areas as well.      ED Treatments / Results  Labs (all labs ordered are listed, but only abnormal results are displayed) Labs Reviewed  CBC - Abnormal; Notable for the following components:      Result Value   Hemoglobin 9.8 (*)    HCT 34.1 (*)    MCV 75.1 (*)    MCH 21.6 (*)    MCHC 28.7 (*)    RDW 16.1 (*)    All other components within normal limits  I-STAT CREATININE, ED - Abnormal; Notable for the following components:   Creatinine, Ser 1.80 (*)    All other components within normal limits  PROTIME-INR  APTT  DIFFERENTIAL  COMPREHENSIVE METABOLIC PANEL  HEMOGLOBIN A1C  LIPID PANEL  CBG MONITORING, ED  I-STAT CREATININE, ED  I-STAT TROPONIN, ED    EKG None  Radiology Ct Head Code Stroke Wo Contrast  Result Date: 01/18/2019 CLINICAL DATA:  Code stroke.  74 y/o  F; left-sided weakness. EXAM: CT HEAD WITHOUT CONTRAST TECHNIQUE: Contiguous axial images were obtained from the base of the skull through the vertex without intravenous contrast. COMPARISON:  07/15/2017 MRI of the head. FINDINGS: Brain: No evidence of acute  infarction, hemorrhage, hydrocephalus, extra-axial collection or mass lesion/mass effect. Small lucency within right medial lentiform nucleus corresponds to a prominent perivascular space on prior MRI of the head. Stable expanded partially empty sella turcica. Vascular: Calcific atherosclerosis of the carotid siphons and vertebral arteries. No hyperdense vessel identified. Skull: Normal. Negative for fracture or focal lesion. Sinuses/Orbits: Mild mucosal thickening of left maxillary sinus and  left anterior ethmoid air cells. Normal aeration of mastoid air cells. Orbits are unremarkable. Other: None. ASPECTS [facility] Stroke Program Early CT Score) - Ganglionic level infarction (caudate, lentiform nuclei, internal capsule, insula, M1-M3 cortex): 7 - Supraganglionic infarction (M4-M6 cortex): 3 Total score (0-10 with 10 being normal): 10 IMPRESSION: 1. No acute intracranial abnormality identified. Stable expanded partially empty sella turcica. Otherwise unremarkable CT of the head for age. 2. ASPECTS is 10 These results were called by telephone at the time of interpretation on 01/18/2019 at 6:34 pm to Dr. Rory Percy, who verbally acknowledged these results. Electronically Signed   By: Kristine Garbe M.D.   On: 01/18/2019 18:35    Procedures Procedures (including critical care time)  Medications Ordered in ED Medications  sodium chloride flush (NS) 0.9 % injection 3 mL (has no administration in time range)  alteplase (ACTIVASE) 1 mg/mL infusion 90 mg (90 mg Intravenous New Bag/Given 01/18/19 1839)    Followed by  0.9 %  sodium chloride infusion (has no administration in time range)  clevidipine (CLEVIPREX) infusion 0.5 mg/mL (has no administration in time range)   stroke: mapping our early stages of recovery book (has no administration in time range)  0.9 %  sodium chloride infusion (has no administration in time range)  acetaminophen (TYLENOL) tablet 650 mg (has no administration in time range)    Or  acetaminophen (TYLENOL) solution 650 mg (has no administration in time range)    Or  acetaminophen (TYLENOL) suppository 650 mg (has no administration in time range)  senna-docusate (Senokot-S) tablet 1 tablet (has no administration in time range)  pantoprazole (PROTONIX) injection 40 mg (has no administration in time range)  insulin aspart (novoLOG) injection 0-20 Units (has no administration in time range)  labetalol (NORMODYNE,TRANDATE) injection 10 mg (10 mg Intravenous Given 01/18/19  1836)  methylPREDNISolone sodium succinate (SOLU-MEDROL) 125 mg/2 mL injection 125 mg (125 mg Intravenous Given 01/18/19 1841)  diphenhydrAMINE (BENADRYL) injection 25 mg (25 mg Intravenous Given 01/18/19 1841)  iohexol (OMNIPAQUE) 350 MG/ML injection 75 mL (75 mLs Intravenous Contrast Given 01/18/19 1839)     Initial Impression / Assessment and Plan / ED Course  I have reviewed the triage vital signs and the nursing notes.  Pertinent labs & imaging results that were available during my care of the patient were reviewed by me and considered in my medical decision making (see chart for details).        Patient is a 73 year old female who came in with left-sided weakness.  She had a CT scan which showed no intracranial hemorrhage.  There is no evidence of a large vessel occlusion.  She was seen by neurology and administered TPA.  She will be admitted to the ICU for further treatment.  Her labs show an anemia which is similar to prior values.  She has a mild elevation in her creatinine which is a little bit higher than her most recent values.  CRITICAL CARE Performed by: Malvin Johns Total critical care time: 30 minutes Critical care time was exclusive of separately billable procedures and treating other patients. Critical care was necessary to treat  or prevent imminent or life-threatening deterioration. Critical care was time spent personally by me on the following activities: development of treatment plan with patient and/or surrogate as well as nursing, discussions with consultants, evaluation of patient's response to treatment, examination of patient, obtaining history from patient or surrogate, ordering and performing treatments and interventions, ordering and review of laboratory studies, ordering and review of radiographic studies, pulse oximetry and re-evaluation of patient's condition.   Final Clinical Impressions(s) / ED Diagnoses   Final diagnoses:  Acute ischemic stroke [facility])     ED Discharge Orders    None       Malvin Johns, MD 01/18/19 1905

## 2019-01-18 NOTE — Progress Notes (Signed)
Pharmacist Code Stroke Response  Notified to mix tPA at 1830 by Dr. Rory Percy Delivered tPA to RN at 1825  tPA dose = 9 mg bolus over 1 minute followed by 81 mg for a total dose of 90 mg over 1 hour  Issues/delays encountered (if applicable): N/A  Harvel Quale 01/18/19 6:47 PM

## 2019-01-18 NOTE — ED Notes (Signed)
ED TO INPATIENT HANDOFF REPORT  ED Nurse Name and Phone #: Aaron Edelman RN [phone]  S Name/Age/Gender Alicia [name] 73 y.o. female Room/Bed: 015C/015C  Code Status   Code Status: Full Code  Home/SNF/Other Home Patient oriented to: self, place, time and situation Is this baseline? Yes   Triage Complete: Triage complete  Chief Complaint CODE STROKE  Triage Note Per [facility], LSN 7564, 20 mins pta pt had sudden onset of left side weakness, slurred speech, facial droop, slow responses. Pt still appears in this state on arrival. Assessed by Neurologist and taken to CT.    Allergies Allergies  Allergen Reactions  . Iodinated Diagnostic Agents Hives  . Sulfa Antibiotics Hives    Level of Care/Admitting Diagnosis ED Disposition    ED Disposition Condition [facility] Area: [place] [[id number]]  Level of Care: ICU [6]  Diagnosis: Stroke [facility]) [[id number]]  Admitting Physician: Amie Portland [[id number]]  Attending Physician: Amie [place] [[id number]]  Estimated length of stay: 3 - 4 days  Certification:: I certify this patient will need inpatient services for at least 2 midnights  PT Class (Do Not Modify): Inpatient [101]  PT Acc Code (Do Not Modify): Private [1]       B Medical/Surgery History Past Medical History:  Diagnosis Date  . Anemia   . Arthritis    "right knee" (10/21/2015)  . CAD (coronary artery disease)    a. Cath 10/21/15: s/p DES to RCA: moderate diffuse stenosis of mod diagonal, diffuse irregularity LCx and LAD. 2D echo 10/21/15: mod LVH, EF 60-65%, no RWMA, calcified MV, mod-severe LAE. 03/18/17 PCI with DES--> 1 diag  . Cataract   . Essential hypertension   . Family history of adverse reaction to anesthesia    "oldest sister, Alicia [name], related to brain formation at back of head; when they put her to sleep it's hard to wake her up"  . GERD (gastroesophageal reflux disease)   . History of blood transfusion    "related to ruptured tubal  pregnancy"  . Hyperlipidemia   . Hypertensive heart disease   . Morbid obesity ([place])   . Type II diabetes mellitus ([place])    Past Surgical History:  Procedure Laterality Date  . ABDOMINAL HYSTERECTOMY  1988  . APPENDECTOMY  1985  . [place] SURGERY  1984  . CARDIAC CATHETERIZATION N/A 10/21/2015   Procedure: Right/Left Heart Cath and Coronary Angiography;  Surgeon: [name], MD;  Location: [facility];  Service: Cardiovascular;  Laterality: N/A;  . CHOLECYSTECTOMY OPEN  1984  . CORONARY STENT INTERVENTION N/A 03/18/2017   Procedure: Coronary Stent Intervention;  Surgeon: [name], MD;  Location: [facility];  Service: Cardiovascular;  Laterality: N/A;  . CORONARY STENT PLACEMENT  03/18/2017   A OPTIMIZE STUDY Drug Eluting Stent (2.5 mm 18 mm - post-dilated to 2.7 mm) was successfully placed  . ECTOPIC PREGNANCY SURGERY    . LEFT HEART CATH AND CORONARY ANGIOGRAPHY N/A 03/18/2017   Procedure: Left Heart Cath and Coronary Angiography;  Surgeon: [name], MD;  Location: [facility];  Service: Cardiovascular;  Laterality: N/A;  . REDUCTION MAMMAPLASTY Bilateral ~ 1976  . TONSILLECTOMY       A IV Location/Drains/Wounds Patient Lines/Drains/Airways Status   Active Line/Drains/Airways    Name:   Placement date:   Placement time:   Site:   Days:   Peripheral IV 11/24/17 Left Antecubital   11/24/17    1815  Antecubital   420   Peripheral IV 01/18/19 Right Antecubital   01/18/19    1835    Antecubital   less than 1          Intake/Output Last 24 hours No intake or output data in the 24 hours ending 01/18/19 1921  Labs/Imaging Results for orders placed or performed during the hospital encounter of 01/18/19 (from the past 48 hour(s))  Protime-INR     Status: None   Collection Time: 01/18/19  6:20 PM  Result Value Ref Range   Prothrombin Time 14.0 11.4 - 15.2 seconds   INR 1.1 0.8 - 1.2    Comment: (NOTE) INR goal varies based on device and  disease states. Performed at [facility], [place] [address] [place], [place] [address]   APTT     Status: None   Collection Time: 01/18/19  6:20 PM  Result Value Ref Range   aPTT 34 24 - 36 seconds    Comment: Performed at [facility] [address], [place], [place] [address]  CBC     Status: Abnormal   Collection Time: 01/18/19  6:20 PM  Result Value Ref Range   WBC 4.6 4.0 - 10.5 K/uL   RBC 4.54 3.87 - 5.11 MIL/uL   Hemoglobin 9.8 (L) 12.0 - 15.0 g/dL   HCT 34.1 (L) 36.0 - 46.0 %   MCV 75.1 (L) 80.0 - 100.0 fL   MCH 21.6 (L) 26.0 - 34.0 pg   MCHC 28.7 (L) 30.0 - 36.0 g/dL   RDW 16.1 (H) 11.5 - 15.5 %   Platelets 231 150 - 400 K/uL   nRBC 0.0 0.0 - 0.2 %    Comment: Performed at [facility] [address], [place], [place] [address]  Differential     Status: None   Collection Time: 01/18/19  6:20 PM  Result Value Ref Range   Neutrophils Relative % 41 %   Neutro Abs 1.9 1.7 - 7.7 K/uL   Lymphocytes Relative 45 %   Lymphs Abs 2.1 0.7 - 4.0 K/uL   Monocytes Relative 10 %   Monocytes Absolute 0.5 0.1 - 1.0 K/uL   Eosinophils Relative 3 %   Eosinophils Absolute 0.1 0.0 - 0.5 K/uL   Basophils Relative 1 %   Basophils Absolute 0.0 0.0 - 0.1 K/uL   Immature Granulocytes 0 %   Abs Immature Granulocytes 0.01 0.00 - 0.07 K/uL    Comment: Performed at [facility] [address], [place], [place] [address]  Comprehensive metabolic panel     Status: Abnormal   Collection Time: 01/18/19  6:20 PM  Result Value Ref Range   Sodium 140 135 - 145 mmol/L   Potassium 4.1 3.5 - 5.1 mmol/L   Chloride 104 98 - 111 mmol/L   CO2 27 22 - 32 mmol/L   Glucose, Bld 134 (H) 70 - 99 mg/dL   BUN 16 8 - 23 mg/dL   Creatinine, Ser 1.80 (H) 0.44 - 1.00 mg/dL   Calcium 8.8 (L) 8.9 - 10.3 mg/dL   Total Protein 6.5 6.5 - 8.1 g/dL   Albumin 3.7 3.5 - 5.0 g/dL   AST 16 15 - 41 U/L   ALT 11 0 - 44 U/L   Alkaline Phosphatase 116 38 - 126 U/L   Total Bilirubin 0.7 0.3 - 1.2  mg/dL   GFR calc non Af Amer 28 (L) >60 mL/min   GFR calc Af Amer 32 (L) >  60 mL/min   Anion gap 9 5 - 15    Comment: Performed at [facility] [address] [place], [place] [address]  I-stat troponin, ED     Status: None   Collection Time: 01/18/19  6:36 PM  Result Value Ref Range   Troponin i, poc 0.00 0.00 - 0.08 ng/mL   Comment 3            Comment: Due to the release kinetics of cTnI, a negative result within the first hours of the onset of symptoms does not rule out myocardial infarction with certainty. If myocardial infarction is still suspected, repeat the test at appropriate intervals.   I-stat Creatinine, ED     Status: Abnormal   Collection Time: 01/18/19  6:38 PM  Result Value Ref Range   Creatinine, Ser 1.80 (H) 0.44 - 1.00 mg/dL   Ct Angio Head W Or Wo Contrast  Result Date: 01/18/2019 CLINICAL DATA:  73 y/o  F; left-sided weakness. Code stroke. EXAM: CT ANGIOGRAPHY HEAD AND NECK TECHNIQUE: Multidetector CT imaging of the head and neck was performed using the standard protocol during bolus administration of intravenous contrast. Multiplanar CT image reconstructions and MIPs were obtained to evaluate the vascular anatomy. Carotid stenosis measurements (when applicable) are obtained utilizing NASCET criteria, using the distal internal carotid diameter as the denominator. CONTRAST:  70mL OMNIPAQUE IOHEXOL 350 MG/ML SOLN COMPARISON:  01/18/2019 CT head. FINDINGS: CTA NECK FINDINGS Aortic arch: Bovine variant branching. Imaged portion shows no evidence of aneurysm or dissection. No significant stenosis of the major arch vessel origins. Right carotid system: No evidence of dissection, stenosis (50% or greater) or occlusion. Mildly beaded non stenotic contour of the internal carotid arteries. Mild non stenotic calcific atherosclerosis of the carotid bifurcation. Left carotid system: No evidence of dissection, stenosis (50% or greater) or occlusion. Mildly beaded non stenotic  contour of the internal carotid arteries. Mild non stenotic calcific atherosclerosis of the carotid bifurcation. Vertebral arteries: Left dominant. No evidence of dissection, stenosis (50% or greater) or occlusion. Skeleton: Moderate spondylosis of the cervical spine with multilevel disc and facet degenerative changes. No significant bony spinal canal stenosis. Uncovertebral and facet hypertrophy result in bony neural foraminal encroachment at the right C3-4 level, right C4-5 level, bilateral C5-6 level, and bilateral C6-7 levels. Other neck: 10 mm nodule within the left thyroid gland. Upper chest: Negative. Review of the MIP images confirms the above findings CTA HEAD FINDINGS Anterior circulation: No significant stenosis, proximal occlusion, aneurysm, or vascular malformation. Calcific atherosclerosis of the carotid siphons with mild bilateral paraclinoid ICA stenosis. Mild distal left M1 stenosis. Posterior circulation: No significant stenosis, proximal occlusion, aneurysm, or vascular malformation. Mild lower basilar stenosis. Venous sinuses: As permitted by contrast timing, patent. Anatomic variants: Patent anterior communicating artery and left posterior communicating arteries. No right posterior communicating artery identified, likely hypoplastic or absent. Delayed phase: No abnormal intracranial enhancement. Review of the MIP images confirms the above findings IMPRESSION: CTA neck: 1. No evidence of dissection, hemodynamically significant stenosis, or occlusion of the carotid and vertebral arteries of the neck. 2. Mild beaded irregularity of the cervical ICA bilaterally may represent fibromuscular dysplasia. 3. Moderate cervical spine spondylosis. CTA head: 1. No large vessel occlusion, aneurysm, or significant stenosis of the Circle of Willis. 2. Intracranial atherosclerosis with mild bilateral paraclinoid ICA and mild distal left M1 stenosis. These results were called by telephone at the time of  interpretation on 01/18/2019 at 7:03 pm to Dr. Rory Percy, who verbally  acknowledged these results. Electronically Signed   By: Kristine Garbe M.D.   On: 01/18/2019 19:03   Ct Angio Neck W Or Wo Contrast  Result Date: 01/18/2019 CLINICAL DATA:  73 y/o  F; left-sided weakness. Code stroke. EXAM: CT ANGIOGRAPHY HEAD AND NECK TECHNIQUE: Multidetector CT imaging of the head and neck was performed using the standard protocol during bolus administration of intravenous contrast. Multiplanar CT image reconstructions and MIPs were obtained to evaluate the vascular anatomy. Carotid stenosis measurements (when applicable) are obtained utilizing NASCET criteria, using the distal internal carotid diameter as the denominator. CONTRAST:  54mL OMNIPAQUE IOHEXOL 350 MG/ML SOLN COMPARISON:  01/18/2019 CT head. FINDINGS: CTA NECK FINDINGS Aortic arch: Bovine variant branching. Imaged portion shows no evidence of aneurysm or dissection. No significant stenosis of the major arch vessel origins. Right carotid system: No evidence of dissection, stenosis (50% or greater) or occlusion. Mildly beaded non stenotic contour of the internal carotid arteries. Mild non stenotic calcific atherosclerosis of the carotid bifurcation. Left carotid system: No evidence of dissection, stenosis (50% or greater) or occlusion. Mildly beaded non stenotic contour of the internal carotid arteries. Mild non stenotic calcific atherosclerosis of the carotid bifurcation. Vertebral arteries: Left dominant. No evidence of dissection, stenosis (50% or greater) or occlusion. Skeleton: Moderate spondylosis of the cervical spine with multilevel disc and facet degenerative changes. No significant bony spinal canal stenosis. Uncovertebral and facet hypertrophy result in bony neural foraminal encroachment at the right C3-4 level, right C4-5 level, bilateral C5-6 level, and bilateral C6-7 levels. Other neck: 10 mm nodule within the left thyroid gland. Upper chest:  Negative. Review of the MIP images confirms the above findings CTA HEAD FINDINGS Anterior circulation: No significant stenosis, proximal occlusion, aneurysm, or vascular malformation. Calcific atherosclerosis of the carotid siphons with mild bilateral paraclinoid ICA stenosis. Mild distal left M1 stenosis. Posterior circulation: No significant stenosis, proximal occlusion, aneurysm, or vascular malformation. Mild lower basilar stenosis. Venous sinuses: As permitted by contrast timing, patent. Anatomic variants: Patent anterior communicating artery and left posterior communicating arteries. No right posterior communicating artery identified, likely hypoplastic or absent. Delayed phase: No abnormal intracranial enhancement. Review of the MIP images confirms the above findings IMPRESSION: CTA neck: 1. No evidence of dissection, hemodynamically significant stenosis, or occlusion of the carotid and vertebral arteries of the neck. 2. Mild beaded irregularity of the cervical ICA bilaterally may represent fibromuscular dysplasia. 3. Moderate cervical spine spondylosis. CTA head: 1. No large vessel occlusion, aneurysm, or significant stenosis of the Circle of Willis. 2. Intracranial atherosclerosis with mild bilateral paraclinoid ICA and mild distal left M1 stenosis. These results were called by telephone at the time of interpretation on 01/18/2019 at 7:03 pm to Dr. Rory Percy, who verbally acknowledged these results. Electronically Signed   By: Kristine Garbe M.D.   On: 01/18/2019 19:03   Ct Head Code Stroke Wo Contrast  Result Date: 01/18/2019 CLINICAL DATA:  Code stroke.  73 y/o  F; left-sided weakness. EXAM: CT HEAD WITHOUT CONTRAST TECHNIQUE: Contiguous axial images were obtained from the base of the skull through the vertex without intravenous contrast. COMPARISON:  07/15/2017 MRI of the head. FINDINGS: Brain: No evidence of acute infarction, hemorrhage, hydrocephalus, extra-axial collection or mass lesion/mass  effect. Small lucency within right medial lentiform nucleus corresponds to a prominent perivascular space on prior MRI of the head. Stable expanded partially empty sella turcica. Vascular: Calcific atherosclerosis of the carotid siphons and vertebral arteries. No hyperdense vessel identified. Skull: Normal. Negative for fracture or focal  lesion. Sinuses/Orbits: Mild mucosal thickening of left maxillary sinus and left anterior ethmoid air cells. Normal aeration of mastoid air cells. Orbits are unremarkable. Other: None. ASPECTS [facility] Stroke Program Early CT Score) - Ganglionic level infarction (caudate, lentiform nuclei, internal capsule, insula, M1-M3 cortex): 7 - Supraganglionic infarction (M4-M6 cortex): 3 Total score (0-10 with 10 being normal): 10 IMPRESSION: 1. No acute intracranial abnormality identified. Stable expanded partially empty sella turcica. Otherwise unremarkable CT of the head for age. 2. ASPECTS is 10 These results were called by telephone at the time of interpretation on 01/18/2019 at 6:34 pm to Dr. Rory Percy, who verbally acknowledged these results. Electronically Signed   By: Kristine Garbe M.D.   On: 01/18/2019 18:35    Pending Labs Unresulted Labs (From admission, onward)    Start     Ordered   01/19/19 0500  Hemoglobin A1c  Tomorrow morning,   R     01/18/19 1844   01/19/19 0500  Lipid panel  Tomorrow morning,   R    Comments:  Fasting    01/18/19 1844          Vitals/Pain Today's Vitals   01/18/19 1854 01/18/19 1856 01/18/19 1900 01/18/19 1906  BP: (!) 172/69 (!) 168/83 (!) 153/79 (!) 174/76  Pulse:  82 79 80  Resp:  (!) 21 16 (!) 21  Temp: 98.3 F (36.8 C)     TempSrc: Oral     SpO2:  100% 100% 100%  Weight:      Height:      PainSc:        Isolation Precautions No active isolations  Medications Medications  sodium chloride flush (NS) 0.9 % injection 3 mL (has no administration in time range)  alteplase (ACTIVASE) 1 mg/mL infusion 90 mg (90 mg  Intravenous New Bag/Given 01/18/19 1839)    Followed by  0.9 %  sodium chloride infusion (has no administration in time range)  clevidipine (CLEVIPREX) infusion 0.5 mg/mL (has no administration in time range)   stroke: mapping our early stages of recovery book (has no administration in time range)  0.9 %  sodium chloride infusion (has no administration in time range)  acetaminophen (TYLENOL) tablet 650 mg (has no administration in time range)    Or  acetaminophen (TYLENOL) solution 650 mg (has no administration in time range)    Or  acetaminophen (TYLENOL) suppository 650 mg (has no administration in time range)  senna-docusate (Senokot-S) tablet 1 tablet (has no administration in time range)  pantoprazole (PROTONIX) injection 40 mg (has no administration in time range)  insulin aspart (novoLOG) injection 0-20 Units (has no administration in time range)  labetalol (NORMODYNE,TRANDATE) injection 10 mg (10 mg Intravenous Given 01/18/19 1836)  methylPREDNISolone sodium succinate (SOLU-MEDROL) 125 mg/2 mL injection 125 mg (125 mg Intravenous Given 01/18/19 1841)  diphenhydrAMINE (BENADRYL) injection 25 mg (25 mg Intravenous Given 01/18/19 1841)  iohexol (OMNIPAQUE) 350 MG/ML injection 75 mL (75 mLs Intravenous Contrast Given 01/18/19 1839)    Mobility walks with person assist     Focused Assessments Neuro Assessment Handoff:  Swallow screen pass? Not yet preformed    NIH Stroke Scale ( + Modified Stroke Scale Criteria)  Interval: Other (Comment)(TPA admin) Level of Consciousness (1a.)   : Alert, keenly responsive LOC Questions (1b. )   +: Answers both questions correctly LOC Commands (1c. )   + : Performs both tasks correctly Best Gaze (2. )  +: Normal Visual (3. )  +: No visual loss Facial Palsy (  4. )    : Normal symmetrical movements Motor Arm, Left (5a. )   +: No drift Motor Arm, Right (5b. )   +: No drift Motor Leg, Left (6a. )   +: Some effort against gravity Motor Leg, Right (6b. )    +: No drift Limb Ataxia (7. ): Absent Sensory (8. )   +: Mild-to-moderate sensory loss, patient feels pinprick is less sharp or is dull on the affected side, or there is a loss of superficial pain with pinprick, but patient is aware of being touched Best Language (9. )   +: No aphasia Dysarthria (10. ): Normal Extinction/Inattention (11.)   +: No Abnormality Modified SS Total  +: 3 Complete NIHSS TOTAL: 4 Last date known well: 01/18/19 Last time known well: 1730 Neuro Assessment: Exceptions to WDL Neuro Checks:   Initial (01/18/19 1835)  Last Documented NIHSS Modified Score: 3 (01/18/19 1915) Has TPA been given? Yes Temp: 98.3 F (36.8 C) (03/01 1854) Temp Source: Oral (03/01 1854) BP: 174/76 (03/01 1906) Pulse Rate: 80 (03/01 1906) If patient is a Neuro Trauma and patient is going to OR before floor call report to [place] nurse: [phone] or [phone]     R Recommendations: See Admitting Provider Note  Report given to: 4N RN   Additional Notes:

## 2019-01-18 NOTE — ED Notes (Signed)
Pt has allergy to IV dye, Neurologist notified and gave verbal order for 125 solumedrol and 25mg  benadryl

## 2019-01-19 ENCOUNTER — Inpatient Hospital Stay (HOSPITAL_COMMUNITY): Payer: Medicare Other

## 2019-01-19 DIAGNOSIS — I6300 Cerebral infarction due to thrombosis of unspecified precerebral artery: Secondary | ICD-10-CM

## 2019-01-19 DIAGNOSIS — I6350 Cerebral infarction due to unspecified occlusion or stenosis of unspecified cerebral artery: Secondary | ICD-10-CM

## 2019-01-19 LAB — HEMOGLOBIN A1C
Hgb A1c MFr Bld: 7.7 % — ABNORMAL HIGH (ref 4.8–5.6)
Mean Plasma Glucose: 174.29 mg/dL

## 2019-01-19 LAB — LIPID PANEL
Cholesterol: 199 mg/dL (ref 0–200)
HDL: 50 mg/dL (ref >40)
LDL Cholesterol: 139 mg/dL — ABNORMAL HIGH (ref 0–99)
Total CHOL/HDL Ratio: 4 RATIO
Triglycerides: 49 mg/dL (ref <150)
VLDL: 10 mg/dL (ref 0–40)

## 2019-01-19 LAB — GLUCOSE, CAPILLARY
Glucose-Capillary: 266 mg/dL — ABNORMAL HIGH (ref 70–99)
Glucose-Capillary: 296 mg/dL — ABNORMAL HIGH (ref 70–99)
Glucose-Capillary: 205 mg/dL — ABNORMAL HIGH (ref 70–99)
Glucose-Capillary: 179 mg/dL — ABNORMAL HIGH (ref 70–99)

## 2019-01-19 LAB — ECHOCARDIOGRAM COMPLETE
Height: 65 in
Weight: 4779.57 oz

## 2019-01-19 MED ORDER — CARVEDILOL 12.5 MG PO TABS
25.0000 mg | ORAL_TABLET | Freq: Two times a day (BID) | ORAL | Status: DC
  Administered 2019-01-19 – 2019-01-22 (×7): 25 mg via ORAL
  Filled 2019-01-19 (×7): qty 2

## 2019-01-19 MED ORDER — ONDANSETRON HCL 4 MG/2ML IJ SOLN
4.0000 mg | Freq: Three times a day (TID) | INTRAMUSCULAR | Status: DC | PRN
  Administered 2019-01-19: 4 mg via INTRAVENOUS
  Filled 2019-01-19: qty 2

## 2019-01-19 MED ORDER — FERROUS SULFATE 325 (65 FE) MG PO TABS
325.0000 mg | ORAL_TABLET | Freq: Every day | ORAL | Status: DC
  Administered 2019-01-19 – 2019-01-22 (×4): 325 mg via ORAL
  Filled 2019-01-19 (×4): qty 1

## 2019-01-19 MED ORDER — LINAGLIPTIN 5 MG PO TABS
5.0000 mg | ORAL_TABLET | Freq: Every day | ORAL | Status: DC
  Administered 2019-01-19 – 2019-01-22 (×4): 5 mg via ORAL
  Filled 2019-01-19 (×4): qty 1

## 2019-01-19 MED ORDER — GLIPIZIDE 5 MG PO TABS
10.0000 mg | ORAL_TABLET | Freq: Two times a day (BID) | ORAL | Status: DC
  Administered 2019-01-19 – 2019-01-22 (×5): 10 mg via ORAL
  Filled 2019-01-19: qty 2
  Filled 2019-01-19: qty 1
  Filled 2019-01-19 (×3): qty 2
  Filled 2019-01-19: qty 1

## 2019-01-19 MED ORDER — DOXAZOSIN MESYLATE 4 MG PO TABS
4.0000 mg | ORAL_TABLET | Freq: Every day | ORAL | Status: DC
  Administered 2019-01-19 – 2019-01-21 (×3): 4 mg via ORAL
  Filled 2019-01-19 (×3): qty 1

## 2019-01-19 MED ORDER — ATORVASTATIN CALCIUM 80 MG PO TABS
80.0000 mg | ORAL_TABLET | Freq: Every day | ORAL | Status: DC
  Administered 2019-01-19 – 2019-01-21 (×3): 80 mg via ORAL
  Filled 2019-01-19 (×3): qty 1

## 2019-01-19 MED ORDER — FUROSEMIDE 20 MG PO TABS
20.0000 mg | ORAL_TABLET | Freq: Every day | ORAL | Status: DC
  Administered 2019-01-19 – 2019-01-22 (×4): 20 mg via ORAL
  Filled 2019-01-19 (×4): qty 1

## 2019-01-19 MED ORDER — ISOSORBIDE MONONITRATE ER 30 MG PO TB24
30.0000 mg | ORAL_TABLET | Freq: Every day | ORAL | Status: DC
  Administered 2019-01-19 – 2019-01-22 (×4): 30 mg via ORAL
  Filled 2019-01-19 (×4): qty 1

## 2019-01-19 MED ORDER — SPIRONOLACTONE 25 MG PO TABS
25.0000 mg | ORAL_TABLET | Freq: Every day | ORAL | Status: DC
  Administered 2019-01-19 – 2019-01-22 (×4): 25 mg via ORAL
  Filled 2019-01-19 (×4): qty 1

## 2019-01-19 MED ORDER — ALUM & MAG HYDROXIDE-SIMETH 200-200-20 MG/5ML PO SUSP
30.0000 mL | ORAL | Status: DC | PRN
  Administered 2019-01-19: 30 mL via ORAL
  Filled 2019-01-19: qty 30

## 2019-01-19 MED ORDER — LOSARTAN POTASSIUM 50 MG PO TABS
100.0000 mg | ORAL_TABLET | Freq: Every day | ORAL | Status: DC
  Administered 2019-01-19 – 2019-01-22 (×4): 100 mg via ORAL
  Filled 2019-01-19 (×4): qty 2

## 2019-01-19 NOTE — Progress Notes (Signed)
PT Cancellation Note  Patient Details Name: Alicia [name] MRN: [id number] DOB: October 07, 1946   Cancelled Treatment:    Reason Eval/Treat Not Completed: Active bedrest order. Pt received tPA at 18:25 3/1. Pt remains in 24 hour window. PT to return as able, as appropriate to complete PT eval.  Kittie Plater, PT, DPT Acute Rehabilitation Services Pager #: [phone] Office #: [phone]    Berline Lopes 01/19/2019, 8:13 AM

## 2019-01-19 NOTE — Evaluation (Addendum)
Occupational Therapy Evaluation Patient Details Name: Alicia [name] MRN: [id number] DOB: Aug 04, 1946 Today's Date: 01/19/2019    History of Present Illness Alicia [name] is an 73 y.o. female  With PMH DM 2, HTN, HLD, CAD, obesity who presented to [facility] ED as a code stroke with c/o left side weakness and slurred speech. pt given tPA at 18:39 3/1. MRI revealed. CT negative. CTA head and neck reveal no emergent LVO.   Clinical Impression   This 73 yo female admitted with above presents to acute OT with decreased use and sensation of LUE as well as decreased balance affecting her safety and independence with basic ADLs. She will benefit from acute OT with follow up [place]. Pt with DOE noted with sats 92% at end of ambulation.    Follow Up Recommendations  Home health OT;Supervision/Assistance - 24 hour    Equipment Recommendations  None recommended by OT       Precautions / Restrictions Precautions Precautions: Fall Restrictions Weight Bearing Restrictions: No      Mobility Bed Mobility Overal bed mobility: Needs Assistance Bed Mobility: Supine to Sit     Supine to sit: HOB elevated;Min guard     General bed mobility comments: no physical assist needed, increased time due to body habititus  Transfers Overall transfer level: Needs assistance Equipment used: None Transfers: Sit to/from Stand Sit to Stand: Min guard         General transfer comment: no episode of instability, guarded with increased time    Balance Overall balance assessment: Needs assistance Sitting-balance support: Feet supported;No upper extremity supported Sitting balance-Leahy Scale: Good     Standing balance support: No upper extremity supported;During functional activity Standing balance-Leahy Scale: Good Standing balance comment: pt able to wash hands at sink without LOB                           ADL either performed or assessed with clinical judgement   ADL Overall ADL's : Needs  assistance/impaired Eating/Feeding: Independent;Sitting   Grooming: Min guard;Standing   Upper Body Bathing: Set up;Sitting   Lower Body Bathing: Min guard;Sit to/from stand   Upper Body Dressing : Set up;Sitting   Lower Body Dressing: Min guard;Sit to/from stand   Toilet Transfer: Minimal assistance;Ambulation;Comfort height toilet;Grab bars Toilet Transfer Details (indicate cue type and reason): one hand held A Toileting- Clothing Manipulation and Hygiene: Min guard;Sit to/from stand         General ADL Comments: Advised pt that she should not drive until her MD clears her to do so, due to decreased movement and sensastion of LUE. Advised pt to be really careful trying to pick up anything in her left hand that is heavy, hot, or can break--because she may drop it--she verbalized understanding.     Vision Baseline Vision/History: Wears glasses Wears Glasses: Distance only Patient Visual Report: No change from baseline Vision Assessment?: Yes Eye Alignment: Within Functional Limits Ocular Range of Motion: Within Functional Limits Alignment/Gaze Preference: Within Defined Limits Tracking/Visual Pursuits: Able to track stimulus in all quads without difficulty Saccades: Within functional limits Convergence: Within functional limits Visual Fields: No apparent deficits            Pertinent Vitals/Pain Pain Assessment: No/denies pain     Hand Dominance Right   Extremity/Trunk Assessment Upper Extremity Assessment Upper Extremity Assessment: LUE deficits/detail LUE Deficits / Details: Can use functionally but with increased time LUE Sensation: decreased light touch(back of  hand and finger tips) LUE Coordination: decreased gross motor(for finger to nose)   Lower Extremity Assessment Lower Extremity Assessment: LLE deficits/detail LLE Deficits / Details: mild L sensation deficits in foot, and generalized weakness at 3-/5 however able to amb without L knee buckling LLE  Sensation: decreased light touch   Cervical / Trunk Assessment Cervical / Trunk Assessment: Normal   Communication Communication Communication: No difficulties   Cognition Arousal/Alertness: Awake/alert Behavior During Therapy: WFL for tasks assessed/performed Overall Cognitive Status: Within Functional Limits for tasks assessed                                     General Comments  pt assisted to the bathroom by [place] expects to be discharged to:: Private residence Living Arrangements: (brother and sister) Available Help at Discharge: Family;Available 24 hours/day Type of Home: House Home Access: Ramped entrance     Home Layout: Multi-level Alternate Level Stairs-Number of Steps: 6 Alternate Level Stairs-Rails: Can reach both Bathroom Shower/Tub: Occupational psychologist: Handicapped height Bathroom Accessibility: Yes   Home Equipment: Cane - quad;Wheelchair - Education officer, community - power;Hospital bed;Adaptive equipment;Hand held shower head;Grab bars - tub/shower;Grab bars - toilet;Shower Theme park manager: Financial trader Comments: stays downstairs in a hospital bed      Prior Functioning/Environment Level of Independence: Independent        Comments: drives        OT Problem List: Decreased strength;Decreased range of motion;Impaired balance (sitting and/or standing);Obesity;Impaired UE functional use;Impaired sensation      OT Treatment/Interventions: Self-care/ADL training;Balance training;Therapeutic exercise;Therapeutic activities;DME and/or AE instruction;Patient/family education    OT Goals(Current goals can be found in the care plan section) Acute Rehab OT Goals Patient Stated Goal: to go home tomorrow so I can vote OT Goal Formulation: With patient Time For Goal Achievement: 02/02/19 Potential to Achieve Goals: Good  OT Frequency: Min 2X/week           Co-evaluation PT/OT/SLP  Co-Evaluation/Treatment: Yes Reason for Co-Treatment: Complexity of the patient's impairments (multi-system involvement) PT goals addressed during session: Mobility/safety with mobility OT goals addressed during session: ADL's and self-care;Strengthening/ROM      AM-PAC OT "6 Clicks" Daily Activity     Outcome Measure Help from another person eating meals?: None Help from another person taking care of personal grooming?: A Little Help from another person toileting, which includes using toliet, bedpan, or urinal?: A Little Help from another person bathing (including washing, rinsing, drying)?: A Little Help from another person to put on and taking off regular upper body clothing?: A Little Help from another person to put on and taking off regular lower body clothing?: A Little 6 Click Score: 19   End of Session Equipment Utilized During Treatment: Gait belt Nurse Communication: Mobility status  Activity Tolerance: Patient tolerated treatment well Patient left: in chair;with call bell/phone within reach;with chair alarm set  OT Visit Diagnosis: Unsteadiness on feet (R26.81);Muscle weakness (generalized) (M62.81)                Time: 4492-0100 OT Time Calculation (min): 35 min Charges:  OT General Charges $OT Visit: 1 Visit OT Evaluation $OT Eval Moderate Complexity: 1 Mod  [place], OTR/L Acute [employer] Pager [phone] Office [phone]     Almon Register 01/19/2019, 11:53 AM

## 2019-01-19 NOTE — Progress Notes (Addendum)
STROKE TEAM PROGRESS NOTE   INTERVAL HISTORY Her Rn is at the bedside.  No family present. She has remained stable over night. Still on cleviprex. Will resume her mult home meds and wean her off. Likely keep in ICU today through tPA window.   Vitals:   01/19/19 0400 01/19/19 0500 01/19/19 0600 01/19/19 0700  BP: (!) 148/80 (!) 149/82 (!) 166/90 (!) 160/83  Pulse: 85 87 84 94  Resp: 14 16 16  (!) 23  Temp: 98.9 F (37.2 C)     TempSrc: Oral     SpO2: 98% 97% 100% 100%  Weight:      Height:        CBC:  Recent Labs  Lab 01/18/19 1820  WBC 4.6  NEUTROABS 1.9  HGB 9.8*  HCT 34.1*  MCV 75.1*  PLT 329    Basic Metabolic Panel:  Recent Labs  Lab 01/18/19 1820 01/18/19 1838  NA 140  --   K 4.1  --   CL 104  --   CO2 27  --   GLUCOSE 134*  --   BUN 16  --   CREATININE 1.80* 1.80*  CALCIUM 8.8*  --    Lipid Panel:     Component Value Date/Time   CHOL 199 01/19/2019 0347   TRIG 49 01/19/2019 0347   HDL 50 01/19/2019 0347   CHOLHDL 4.0 01/19/2019 0347   VLDL 10 01/19/2019 0347   LDLCALC 139 (H) 01/19/2019 0347   HgbA1c:  Lab Results  Component Value Date   HGBA1C 7.7 (H) 01/19/2019    IMAGING Ct Angio Head W Or Wo Contrast  Result Date: 01/18/2019 CLINICAL DATA:  73 y/o  F; left-sided weakness. Code stroke. EXAM: CT ANGIOGRAPHY HEAD AND NECK TECHNIQUE: Multidetector CT imaging of the head and neck was performed using the standard protocol during bolus administration of intravenous contrast. Multiplanar CT image reconstructions and MIPs were obtained to evaluate the vascular anatomy. Carotid stenosis measurements (when applicable) are obtained utilizing NASCET criteria, using the distal internal carotid diameter as the denominator. CONTRAST:  97mL OMNIPAQUE IOHEXOL 350 MG/ML SOLN COMPARISON:  01/18/2019 CT head. FINDINGS: CTA NECK FINDINGS Aortic arch: Bovine variant branching. Imaged portion shows no evidence of aneurysm or dissection. No significant stenosis of the  major arch vessel origins. Right carotid system: No evidence of dissection, stenosis (50% or greater) or occlusion. Mildly beaded non stenotic contour of the internal carotid arteries. Mild non stenotic calcific atherosclerosis of the carotid bifurcation. Left carotid system: No evidence of dissection, stenosis (50% or greater) or occlusion. Mildly beaded non stenotic contour of the internal carotid arteries. Mild non stenotic calcific atherosclerosis of the carotid bifurcation. Vertebral arteries: Left dominant. No evidence of dissection, stenosis (50% or greater) or occlusion. Skeleton: Moderate spondylosis of the cervical spine with multilevel disc and facet degenerative changes. No significant bony spinal canal stenosis. Uncovertebral and facet hypertrophy result in bony neural foraminal encroachment at the right C3-4 level, right C4-5 level, bilateral C5-6 level, and bilateral C6-7 levels. Other neck: 10 mm nodule within the left thyroid gland. Upper chest: Negative. Review of the MIP images confirms the above findings CTA HEAD FINDINGS Anterior circulation: No significant stenosis, proximal occlusion, aneurysm, or vascular malformation. Calcific atherosclerosis of the carotid siphons with mild bilateral paraclinoid ICA stenosis. Mild distal left M1 stenosis. Posterior circulation: No significant stenosis, proximal occlusion, aneurysm, or vascular malformation. Mild lower basilar stenosis. Venous sinuses: As permitted by contrast timing, patent. Anatomic variants: Patent anterior communicating artery and  left posterior communicating arteries. No right posterior communicating artery identified, likely hypoplastic or absent. Delayed phase: No abnormal intracranial enhancement. Review of the MIP images confirms the above findings IMPRESSION: CTA neck: 1. No evidence of dissection, hemodynamically significant stenosis, or occlusion of the carotid and vertebral arteries of the neck. 2. Mild beaded irregularity of  the cervical ICA bilaterally may represent fibromuscular dysplasia. 3. Moderate cervical spine spondylosis. CTA head: 1. No large vessel occlusion, aneurysm, or significant stenosis of the Circle of Willis. 2. Intracranial atherosclerosis with mild bilateral paraclinoid ICA and mild distal left M1 stenosis. These results were called by telephone at the time of interpretation on 01/18/2019 at 7:03 pm to Dr. Rory Percy, who verbally acknowledged these results. Electronically Signed   By: Kristine Garbe M.D.   On: 01/18/2019 19:03   Ct Angio Neck W Or Wo Contrast  Result Date: 01/18/2019 CLINICAL DATA:  73 y/o  F; left-sided weakness. Code stroke. EXAM: CT ANGIOGRAPHY HEAD AND NECK TECHNIQUE: Multidetector CT imaging of the head and neck was performed using the standard protocol during bolus administration of intravenous contrast. Multiplanar CT image reconstructions and MIPs were obtained to evaluate the vascular anatomy. Carotid stenosis measurements (when applicable) are obtained utilizing NASCET criteria, using the distal internal carotid diameter as the denominator. CONTRAST:  17mL OMNIPAQUE IOHEXOL 350 MG/ML SOLN COMPARISON:  01/18/2019 CT head. FINDINGS: CTA NECK FINDINGS Aortic arch: Bovine variant branching. Imaged portion shows no evidence of aneurysm or dissection. No significant stenosis of the major arch vessel origins. Right carotid system: No evidence of dissection, stenosis (50% or greater) or occlusion. Mildly beaded non stenotic contour of the internal carotid arteries. Mild non stenotic calcific atherosclerosis of the carotid bifurcation. Left carotid system: No evidence of dissection, stenosis (50% or greater) or occlusion. Mildly beaded non stenotic contour of the internal carotid arteries. Mild non stenotic calcific atherosclerosis of the carotid bifurcation. Vertebral arteries: Left dominant. No evidence of dissection, stenosis (50% or greater) or occlusion. Skeleton: Moderate spondylosis  of the cervical spine with multilevel disc and facet degenerative changes. No significant bony spinal canal stenosis. Uncovertebral and facet hypertrophy result in bony neural foraminal encroachment at the right C3-4 level, right C4-5 level, bilateral C5-6 level, and bilateral C6-7 levels. Other neck: 10 mm nodule within the left thyroid gland. Upper chest: Negative. Review of the MIP images confirms the above findings CTA HEAD FINDINGS Anterior circulation: No significant stenosis, proximal occlusion, aneurysm, or vascular malformation. Calcific atherosclerosis of the carotid siphons with mild bilateral paraclinoid ICA stenosis. Mild distal left M1 stenosis. Posterior circulation: No significant stenosis, proximal occlusion, aneurysm, or vascular malformation. Mild lower basilar stenosis. Venous sinuses: As permitted by contrast timing, patent. Anatomic variants: Patent anterior communicating artery and left posterior communicating arteries. No right posterior communicating artery identified, likely hypoplastic or absent. Delayed phase: No abnormal intracranial enhancement. Review of the MIP images confirms the above findings IMPRESSION: CTA neck: 1. No evidence of dissection, hemodynamically significant stenosis, or occlusion of the carotid and vertebral arteries of the neck. 2. Mild beaded irregularity of the cervical ICA bilaterally may represent fibromuscular dysplasia. 3. Moderate cervical spine spondylosis. CTA head: 1. No large vessel occlusion, aneurysm, or significant stenosis of the Circle of Willis. 2. Intracranial atherosclerosis with mild bilateral paraclinoid ICA and mild distal left M1 stenosis. These results were called by telephone at the time of interpretation on 01/18/2019 at 7:03 pm to Dr. Rory Percy, who verbally acknowledged these results. Electronically Signed   By: Edgardo Roys.D.  On: 01/18/2019 19:03   Ct Head Code Stroke Wo Contrast  Result Date: 01/18/2019 CLINICAL DATA:  Code  stroke.  73 y/o  F; left-sided weakness. EXAM: CT HEAD WITHOUT CONTRAST TECHNIQUE: Contiguous axial images were obtained from the base of the skull through the vertex without intravenous contrast. COMPARISON:  07/15/2017 MRI of the head. FINDINGS: Brain: No evidence of acute infarction, hemorrhage, hydrocephalus, extra-axial collection or mass lesion/mass effect. Small lucency within right medial lentiform nucleus corresponds to a prominent perivascular space on prior MRI of the head. Stable expanded partially empty sella turcica. Vascular: Calcific atherosclerosis of the carotid siphons and vertebral arteries. No hyperdense vessel identified. Skull: Normal. Negative for fracture or focal lesion. Sinuses/Orbits: Mild mucosal thickening of left maxillary sinus and left anterior ethmoid air cells. Normal aeration of mastoid air cells. Orbits are unremarkable. Other: None. ASPECTS [facility] Stroke Program Early CT Score) - Ganglionic level infarction (caudate, lentiform nuclei, internal capsule, insula, M1-M3 cortex): 7 - Supraganglionic infarction (M4-M6 cortex): 3 Total score (0-10 with 10 being normal): 10 IMPRESSION: 1. No acute intracranial abnormality identified. Stable expanded partially empty sella turcica. Otherwise unremarkable CT of the head for age. 2. ASPECTS is 10 These results were called by telephone at the time of interpretation on 01/18/2019 at 6:34 pm to Dr. Rory Percy, who verbally acknowledged these results. Electronically Signed   By: Kristine Garbe M.D.   On: 01/18/2019 18:35    PHYSICAL EXAM  pleasant obese elderly African American lady not in distress. . Afebrile. Head is nontraumatic. Neck is supple without bruit.    Cardiac exam no murmur or gallop. Lungs are clear to auscultation. Distal pulses are well felt. Neurological Exam :  Awake alert oriented to time place and person. No dysarthria or aphasia or apraxia. Extraocular moments are full range without nystagmus. Blinks to threat  bilaterally. Fundi not visualized. Face is symmetric without weakness. Tongue midline. Motor system exam reveals mild left lower extremity drift. Good proximal strength bilaterally. Mild weakness of left grip and intrinsic hand muscles and left ankle dorsiflexors and hip flexors only. Sensation appears preserved bilaterally. Deep tendon reflexes are symmetric. Plantars are downgoing. Gait not tested.  NIH stroke scale 1 Premorbid modified Rankin scale 0  ASSESSMENT/PLAN Ms. Alicia [name] is a 73 y.o. female with history of HTN, HLD, DB, CAD, obesity presenting with L sided weakness and slurred speech. Received IV tPA 01/18/2019 at [place].  Stroke:  Likely R brain infarct s/p IV tPA, workup underway  Code Stroke CT head No acute stroke. Small vessel disease. Atrophy. ASPECTS 10.     CTA head no LVO. atherosclerosis w/ B paraclinoid ICA and L M1 mild stenosis  CTA neck no LVO. Mild B ICA beading. Mod cervical spndylosis  MRI  pending   2D Echo  pending   LDL 139  HgbA1c 7.7  SCDs for VTE prophylaxis  aspirin 81 mg daily and clopidogrel 75 mg daily prior to admission, now on No antithrombotic as within 24 h tPA administration. Plan to resume both agents if imaging negative for hemorrhage.   Therapy recommendations:  Pending. Ok to be OOB  Disposition:  pending   Hypertension  Required cleviprex treatment pre-tPA  On multiple home BP meds  Resume home meds  Wean Cleviprex as able  Current BP goals per post thrombolytic orders . Long-term BP goal normotensive  Hyperlipidemia  Home meds:  lipitor 80, resumed in hospital  LDL 139, goal < 70  May need to consider alternative treatments  as an OP   Continue statin at discharge  Diabetes type II  HgbA1c 7.7, goal < 7.0  Uncontrolled  SSI  CBGs  Resume home meds except for metformin  Other Stroke Risk Factors  Advanced age  Morbid Obesity, Body mass index is 49.71 kg/m., recommend weight loss, diet and exercise  as appropriate   Family hx stroke (father)  Coronary artery disease PCI, DES  Other Active Problems  Possible AKI on CKD Cr 1.8. recheck in am. On NS at [facility] day # [place], MSN, APRN, ANVP-BC, AGPCNP-BC Advanced Practice Stroke Nurse [place] for Schedule & Pager information 01/19/2019 12:20 PM  I have personally obtained history,examined this patient, reviewed notes, independently viewed imaging studies, participated in medical decision making and plan of care.ROS completed by me personally and pertinent positives fully documented  I have made any additions or clarifications directly to the above note. Agree with note above. She presented with left-sided weakness likely related to right brain subcortical infarct etiology likely small vessel disease. She received IV tPA and appears to obtain substantial improvement. Recommend close observation in the ICU with strict blood pressure control and neurological monitoring as per post TPA protocol. Check MRI scan of the brain later today, echocardiogram, hemoglobin A1c and lipid profile. Mobilize out of bed and therapy consults. Anticipate likely discharge home or to rehabilitation in the next few days. No family available at the bedside for discussion.This patient is critically ill and at significant risk of neurological worsening, death and care requires constant monitoring of vital signs, hemodynamics,respiratory and cardiac monitoring, extensive review of multiple databases, frequent neurological assessment, discussion with family, other specialists and medical decision making of high complexity.I have made any additions or clarifications directly to the above note.This critical care time does not reflect procedure time, or teaching time or supervisory time of PA/NP/Med Resident etc but could involve care discussion time.  I spent 30 minutes of neurocritical care time  in the care of  this patient. I  discussed with the patient possible participation in the sleep smart stroke prevention trial and she expressed interest. She'll be given information to review and decide.     Antony Contras, MD Medical Director [place] Pager: [phone] 01/19/2019 1:21 PM  To contact Stroke Continuity provider, please refer to [email or web address] After hours, contact General Neurology

## 2019-01-19 NOTE — Evaluation (Signed)
Physical Therapy Evaluation Patient Details Name: Alicia [name] MRN: [id number] DOB: 07-Nov-1946 Today's Date: 01/19/2019   History of Present Illness  Alicia [name] is an 73 y.o. female  With PMH DM 2, HTN, HLD, CAD, obesity who presented to [facility] ED as a code stroke with c/o left side weakness and slurred speech. pt given tPA at 18:39 3/1. MRI revealed. CT negative. CTA head and neck reveal no emergent LVO.  Clinical Impression  Pt admitted with above. Pt with mild L sided weakness, impaired sensation and impaired co-ordination. Pt determined to return home tomorrow to vote. Pt with 1 story home and 24/7. Anticipate pt with progress to be safe to return home with family with HHPT. Acute PT to cont to follow.    Follow Up Recommendations Home health PT;Supervision/Assistance - 24 hour    Equipment Recommendations  None recommended by PT    Recommendations for Other Services       Precautions / Restrictions Precautions Precautions: Fall Restrictions Weight Bearing Restrictions: No      Mobility  Bed Mobility Overal bed mobility: Needs Assistance Bed Mobility: Supine to Sit     Supine to sit: HOB elevated;Min guard     General bed mobility comments: no physical assist needed, increased time due to body habititus  Transfers Overall transfer level: Needs assistance Equipment used: None Transfers: Sit to/from Stand Sit to Stand: Min guard         General transfer comment: no episode of instability, guarded with increased time  Ambulation/Gait Ambulation/Gait assistance: Min assist Gait Distance (Feet): 120 Feet Assistive device: 1 person hand held assist Gait Pattern/deviations: Step-through pattern;Decreased stride length;Trunk flexed;Wide base of support Gait velocity: decreased Gait velocity interpretation: <1.31 ft/sec, indicative of household ambulator General Gait Details: pt with progressive fatigue, SOB, SpO2 at 92% on RA. pt with progressive trunk flexion, can  correct with verbal cues but can not maintain, no episodes of LOB  Stairs            Wheelchair Mobility    Modified Rankin (Stroke Patients Only) Modified Rankin (Stroke Patients Only) Pre-Morbid Rankin Score: No significant disability Modified Rankin: Slight disability     Balance Overall balance assessment: Needs assistance Sitting-balance support: Feet supported;No upper extremity supported Sitting balance-Leahy Scale: Good     Standing balance support: No upper extremity supported;During functional activity Standing balance-Leahy Scale: Good Standing balance comment: pt able to wash hands at sink without LOB                             Pertinent Vitals/Pain Pain Assessment: No/denies pain    Home Living Family/patient expects to be discharged to:: Private residence Living Arrangements: (brother and sister) Available Help at Discharge: Family;Available 24 hours/day Type of Home: House Home Access: Ramped entrance     Home Layout: Multi-level Home Equipment: [place];Hospital bed;Adaptive equipment;Hand held shower head;Grab bars - tub/shower;Grab bars - toilet;Shower seat Additional Comments: stays downstairs in a hospital bed    Prior Function Level of Independence: Independent         Comments: drives     Hand Dominance   Dominant Hand: Right    Extremity/Trunk Assessment   Upper Extremity Assessment Upper Extremity Assessment: Defer to OT evaluation    Lower Extremity Assessment Lower Extremity Assessment: LLE deficits/detail LLE Deficits / Details: mild L sensation deficits in foot, and generalized weakness at 3-/5 however able  to amb without L knee buckling LLE Sensation: decreased light touch    Cervical / Trunk Assessment Cervical / Trunk Assessment: Normal  Communication   Communication: No difficulties  Cognition Arousal/Alertness: Awake/alert Behavior During Therapy: WFL for  tasks assessed/performed Overall Cognitive Status: Within Functional Limits for tasks assessed                                        General Comments General comments (skin integrity, edema, etc.): pt assisted to the bathroom by OT    Exercises     Assessment/Plan    PT Assessment Patient needs continued PT services  PT Problem List Decreased strength;Decreased activity tolerance;Decreased coordination;Decreased safety awareness;Obesity       PT Treatment Interventions DME instruction;Gait training;Stair training;Functional mobility training;Therapeutic activities;Therapeutic exercise;Balance training;Neuromuscular re-education    PT Goals (Current goals can be found in the Care Plan section)  Acute Rehab PT Goals Patient Stated Goal: home tomorrow to vote PT Goal Formulation: With patient Time For Goal Achievement: 02/02/19 Potential to Achieve Goals: Good    Frequency Min 4X/week   Barriers to discharge        Co-evaluation PT/OT/SLP Co-Evaluation/Treatment: Yes Reason for Co-Treatment: Complexity of the patient's impairments (multi-system involvement) PT goals addressed during session: Mobility/safety with mobility         AM-PAC PT "6 Clicks" Mobility  Outcome Measure Help needed turning from your back to your side while in a flat bed without using bedrails?: None Help needed moving from lying on your back to sitting on the side of a flat bed without using bedrails?: None Help needed moving to and from a bed to a chair (including a wheelchair)?: None Help needed standing up from a chair using your arms (e.g., wheelchair or bedside chair)?: None Help needed to walk in hospital room?: A Little Help needed climbing 3-5 steps with a railing? : A Little 6 Click Score: 22    End of Session Equipment Utilized During Treatment: Gait belt Activity Tolerance: Patient tolerated treatment well Patient left: in chair;with call bell/phone within reach;with  chair alarm set;with nursing/sitter in room Nurse Communication: Mobility status PT Visit Diagnosis: Unsteadiness on feet (R26.81)    Time: 1040-1108 PT Time Calculation (min) (ACUTE ONLY): 28 min   Charges:   PT Evaluation $PT Eval Moderate Complexity: 1 Mod          Kittie Plater, PT, DPT Acute Rehabilitation Services Pager #: [phone] Office #: [phone]   Berline Lopes 01/19/2019, 11:30 AM

## 2019-01-19 NOTE — Progress Notes (Signed)
Report called to 3W. Patient and belongings transferred by wheelchair to [phone].

## 2019-01-19 NOTE — Plan of Care (Signed)
  Problem: Education: Goal: Knowledge of disease or condition will improve Outcome: Progressing   

## 2019-01-19 NOTE — Progress Notes (Signed)
  Echocardiogram 2D Echocardiogram has been performed.  Alicia [name] 01/19/2019, 3:48 PM

## 2019-01-20 DIAGNOSIS — G4733 Obstructive sleep apnea (adult) (pediatric): Secondary | ICD-10-CM | POA: Diagnosis present

## 2019-01-20 LAB — CBC
HCT: 31.6 % — ABNORMAL LOW (ref 36.0–46.0)
Hemoglobin: 9.4 g/dL — ABNORMAL LOW (ref 12.0–15.0)
MCH: 21.9 pg — ABNORMAL LOW (ref 26.0–34.0)
MCHC: 29.7 g/dL — AB (ref 30.0–36.0)
MCV: 73.5 fL — ABNORMAL LOW (ref 80.0–100.0)
Platelets: 219 10*3/uL (ref 150–400)
RBC: 4.3 MIL/uL (ref 3.87–5.11)
RDW: 16.5 % — ABNORMAL HIGH (ref 11.5–15.5)
WBC: 7.9 10*3/uL (ref 4.0–10.5)
nRBC: 0 % (ref 0.0–0.2)

## 2019-01-20 LAB — GLUCOSE, CAPILLARY
GLUCOSE-CAPILLARY: 232 mg/dL — AB (ref 70–99)
Glucose-Capillary: 158 mg/dL — ABNORMAL HIGH (ref 70–99)
Glucose-Capillary: 100 mg/dL — ABNORMAL HIGH (ref 70–99)

## 2019-01-20 LAB — BASIC METABOLIC PANEL
Anion gap: 7 (ref 5–15)
BUN: 20 mg/dL (ref 8–23)
CO2: 25 mmol/L (ref 22–32)
Calcium: 8.1 mg/dL — ABNORMAL LOW (ref 8.9–10.3)
Chloride: 107 mmol/L (ref 98–111)
Creatinine, Ser: 1.61 mg/dL — ABNORMAL HIGH (ref 0.44–1.00)
GFR calc Af Amer: 37 mL/min — ABNORMAL LOW (ref >60)
GFR calc non Af Amer: 32 mL/min — ABNORMAL LOW (ref >60)
GLUCOSE: 150 mg/dL — AB (ref 70–99)
Potassium: 4 mmol/L (ref 3.5–5.1)
Sodium: 139 mmol/L (ref 135–145)

## 2019-01-20 MED ORDER — ASPIRIN EC 81 MG PO TBEC
81.0000 mg | DELAYED_RELEASE_TABLET | Freq: Every day | ORAL | Status: DC
  Administered 2019-01-20 – 2019-01-22 (×3): 81 mg via ORAL
  Filled 2019-01-20 (×3): qty 1

## 2019-01-20 MED ORDER — HALOPERIDOL LACTATE 5 MG/ML IJ SOLN
1.0000 mg | Freq: Once | INTRAMUSCULAR | Status: AC
  Administered 2019-01-21: 1 mg via INTRAVENOUS
  Filled 2019-01-20: qty 1

## 2019-01-20 MED ORDER — CLOPIDOGREL BISULFATE 75 MG PO TABS
75.0000 mg | ORAL_TABLET | Freq: Every day | ORAL | Status: DC
  Administered 2019-01-20 – 2019-01-22 (×3): 75 mg via ORAL
  Filled 2019-01-20 (×3): qty 1

## 2019-01-20 MED ORDER — PANTOPRAZOLE SODIUM 40 MG PO TBEC
40.0000 mg | DELAYED_RELEASE_TABLET | Freq: Every day | ORAL | Status: DC
  Administered 2019-01-20 – 2019-01-21 (×2): 40 mg via ORAL
  Filled 2019-01-20 (×2): qty 1

## 2019-01-20 MED ORDER — ASPIRIN 81 MG PO TABS: 81.0000 mg | ORAL_TABLET | Freq: Every day | ORAL | Status: DC

## 2019-01-20 MED ORDER — LORAZEPAM 2 MG/ML IJ SOLN
1.0000 mg | Freq: Once | INTRAMUSCULAR | Status: AC
  Administered 2019-01-21: 1 mg via INTRAVENOUS
  Filled 2019-01-20: qty 1

## 2019-01-20 NOTE — Progress Notes (Signed)
STROKE TEAM PROGRESS NOTE   INTERVAL HISTORY Her brother is at the bedside.   She is doing well. MRI was not done last night but will be done today. She did the [facility] 3 monitor for the sleep smart study but was a screen failure  Vitals:   01/19/19 2305 01/20/19 0431 01/20/19 0822 01/20/19 1251  BP:  135/67 (!) 126/56 (!) 112/54  Pulse: 66 64 73 70  Resp:  18 15 17   Temp:  98.2 F (36.8 C) 98 F (36.7 C) 98 F (36.7 C)  TempSrc:  Oral Oral Oral  SpO2: 100% 97% 99% 100%  Weight:      Height:        CBC:  Recent Labs  Lab 01/18/19 1820 01/20/19 0529  WBC 4.6 7.9  NEUTROABS 1.9  --   HGB 9.8* 9.4*  HCT 34.1* 31.6*  MCV 75.1* 73.5*  PLT 231 614    Basic Metabolic Panel:  Recent Labs  Lab 01/18/19 1820 01/18/19 1838 01/20/19 0529  NA 140  --  139  K 4.1  --  4.0  CL 104  --  107  CO2 27  --  25  GLUCOSE 134*  --  150*  BUN 16  --  20  CREATININE 1.80* 1.80* 1.61*  CALCIUM 8.8*  --  8.1*   Lipid Panel:     Component Value Date/Time   CHOL 199 01/19/2019 0347   TRIG 49 01/19/2019 0347   HDL 50 01/19/2019 0347   CHOLHDL 4.0 01/19/2019 0347   VLDL 10 01/19/2019 0347   LDLCALC 139 (H) 01/19/2019 0347   HgbA1c:  Lab Results  Component Value Date   HGBA1C 7.7 (H) 01/19/2019    IMAGING Ct Angio Head W Or Wo Contrast  Result Date: 01/18/2019 CLINICAL DATA:  73 y/o  F; left-sided weakness. Code stroke. EXAM: CT ANGIOGRAPHY HEAD AND NECK TECHNIQUE: Multidetector CT imaging of the head and neck was performed using the standard protocol during bolus administration of intravenous contrast. Multiplanar CT image reconstructions and MIPs were obtained to evaluate the vascular anatomy. Carotid stenosis measurements (when applicable) are obtained utilizing NASCET criteria, using the distal internal carotid diameter as the denominator. CONTRAST:  18mL OMNIPAQUE IOHEXOL 350 MG/ML SOLN COMPARISON:  01/18/2019 CT head. FINDINGS: CTA NECK FINDINGS Aortic arch: Bovine variant  branching. Imaged portion shows no evidence of aneurysm or dissection. No significant stenosis of the major arch vessel origins. Right carotid system: No evidence of dissection, stenosis (50% or greater) or occlusion. Mildly beaded non stenotic contour of the internal carotid arteries. Mild non stenotic calcific atherosclerosis of the carotid bifurcation. Left carotid system: No evidence of dissection, stenosis (50% or greater) or occlusion. Mildly beaded non stenotic contour of the internal carotid arteries. Mild non stenotic calcific atherosclerosis of the carotid bifurcation. Vertebral arteries: Left dominant. No evidence of dissection, stenosis (50% or greater) or occlusion. Skeleton: Moderate spondylosis of the cervical spine with multilevel disc and facet degenerative changes. No significant bony spinal canal stenosis. Uncovertebral and facet hypertrophy result in bony neural foraminal encroachment at the right C3-4 level, right C4-5 level, bilateral C5-6 level, and bilateral C6-7 levels. Other neck: 10 mm nodule within the left thyroid gland. Upper chest: Negative. Review of the MIP images confirms the above findings CTA HEAD FINDINGS Anterior circulation: No significant stenosis, proximal occlusion, aneurysm, or vascular malformation. Calcific atherosclerosis of the carotid siphons with mild bilateral paraclinoid ICA stenosis. Mild distal left M1 stenosis. Posterior circulation: No significant stenosis, proximal  occlusion, aneurysm, or vascular malformation. Mild lower basilar stenosis. Venous sinuses: As permitted by contrast timing, patent. Anatomic variants: Patent anterior communicating artery and left posterior communicating arteries. No right posterior communicating artery identified, likely hypoplastic or absent. Delayed phase: No abnormal intracranial enhancement. Review of the MIP images confirms the above findings IMPRESSION: CTA neck: 1. No evidence of dissection, hemodynamically significant  stenosis, or occlusion of the carotid and vertebral arteries of the neck. 2. Mild beaded irregularity of the cervical ICA bilaterally may represent fibromuscular dysplasia. 3. Moderate cervical spine spondylosis. CTA head: 1. No large vessel occlusion, aneurysm, or significant stenosis of the Circle of Willis. 2. Intracranial atherosclerosis with mild bilateral paraclinoid ICA and mild distal left M1 stenosis. These results were called by telephone at the time of interpretation on 01/18/2019 at 7:03 pm to Dr. Rory Percy, who verbally acknowledged these results. Electronically Signed   By: Kristine Garbe M.D.   On: 01/18/2019 19:03   Ct Head Wo Contrast  Result Date: 01/19/2019 CLINICAL DATA:  Follow up stroke. History of hypertension, hyperlipidemia and diabetes. EXAM: CT HEAD WITHOUT CONTRAST TECHNIQUE: Contiguous axial images were obtained from the base of the skull through the vertex without intravenous contrast. COMPARISON:  CT HEAD January 18, 2019 and MRI head July 15, 2017 FINDINGS: BRAIN: No intraparenchymal hemorrhage, mass effect nor midline shift. No parenchymal brain volume loss for age. No hydrocephalus. Faint supratentorial white matter hypodensities less than expected for patient's age, though non-specific are most compatible with chronic small vessel ischemic disease. Age indeterminate RIGHT basal ganglia lacunar infarct. No acute large vascular territory infarcts. No abnormal extra-axial fluid collections. Basal cisterns are patent. VASCULAR: Moderate calcific atherosclerosis of the carotid siphons. SKULL: No skull fracture. No significant scalp soft tissue swelling. SINUSES/ORBITS: Trace paranasal sinus mucosal thickening. LEFT ethmoid mucosal retention cyst. Mastoid air cells are well aerated.The included ocular globes and orbital contents are non-suspicious. OTHER: None. IMPRESSION: 1. Age indeterminate RIGHT basal ganglia lacunar infarct. 2. Otherwise negative non-contrast CT HEAD for age.  Electronically Signed   By: Elon Alas M.D.   On: 01/19/2019 18:47   Ct Angio Neck W Or Wo Contrast  Result Date: 01/18/2019 CLINICAL DATA:  73 y/o  F; left-sided weakness. Code stroke. EXAM: CT ANGIOGRAPHY HEAD AND NECK TECHNIQUE: Multidetector CT imaging of the head and neck was performed using the standard protocol during bolus administration of intravenous contrast. Multiplanar CT image reconstructions and MIPs were obtained to evaluate the vascular anatomy. Carotid stenosis measurements (when applicable) are obtained utilizing NASCET criteria, using the distal internal carotid diameter as the denominator. CONTRAST:  74mL OMNIPAQUE IOHEXOL 350 MG/ML SOLN COMPARISON:  01/18/2019 CT head. FINDINGS: CTA NECK FINDINGS Aortic arch: Bovine variant branching. Imaged portion shows no evidence of aneurysm or dissection. No significant stenosis of the major arch vessel origins. Right carotid system: No evidence of dissection, stenosis (50% or greater) or occlusion. Mildly beaded non stenotic contour of the internal carotid arteries. Mild non stenotic calcific atherosclerosis of the carotid bifurcation. Left carotid system: No evidence of dissection, stenosis (50% or greater) or occlusion. Mildly beaded non stenotic contour of the internal carotid arteries. Mild non stenotic calcific atherosclerosis of the carotid bifurcation. Vertebral arteries: Left dominant. No evidence of dissection, stenosis (50% or greater) or occlusion. Skeleton: Moderate spondylosis of the cervical spine with multilevel disc and facet degenerative changes. No significant bony spinal canal stenosis. Uncovertebral and facet hypertrophy result in bony neural foraminal encroachment at the right C3-4 level, right C4-5 level, bilateral  C5-6 level, and bilateral C6-7 levels. Other neck: 10 mm nodule within the left thyroid gland. Upper chest: Negative. Review of the MIP images confirms the above findings CTA HEAD FINDINGS Anterior circulation:  No significant stenosis, proximal occlusion, aneurysm, or vascular malformation. Calcific atherosclerosis of the carotid siphons with mild bilateral paraclinoid ICA stenosis. Mild distal left M1 stenosis. Posterior circulation: No significant stenosis, proximal occlusion, aneurysm, or vascular malformation. Mild lower basilar stenosis. Venous sinuses: As permitted by contrast timing, patent. Anatomic variants: Patent anterior communicating artery and left posterior communicating arteries. No right posterior communicating artery identified, likely hypoplastic or absent. Delayed phase: No abnormal intracranial enhancement. Review of the MIP images confirms the above findings IMPRESSION: CTA neck: 1. No evidence of dissection, hemodynamically significant stenosis, or occlusion of the carotid and vertebral arteries of the neck. 2. Mild beaded irregularity of the cervical ICA bilaterally may represent fibromuscular dysplasia. 3. Moderate cervical spine spondylosis. CTA head: 1. No large vessel occlusion, aneurysm, or significant stenosis of the Circle of Willis. 2. Intracranial atherosclerosis with mild bilateral paraclinoid ICA and mild distal left M1 stenosis. These results were called by telephone at the time of interpretation on 01/18/2019 at 7:03 pm to Dr. Rory Percy, who verbally acknowledged these results. Electronically Signed   By: Kristine Garbe M.D.   On: 01/18/2019 19:03   Ct Head Code Stroke Wo Contrast  Result Date: 01/18/2019 CLINICAL DATA:  Code stroke.  73 y/o  F; left-sided weakness. EXAM: CT HEAD WITHOUT CONTRAST TECHNIQUE: Contiguous axial images were obtained from the base of the skull through the vertex without intravenous contrast. COMPARISON:  07/15/2017 MRI of the head. FINDINGS: Brain: No evidence of acute infarction, hemorrhage, hydrocephalus, extra-axial collection or mass lesion/mass effect. Small lucency within right medial lentiform nucleus corresponds to a prominent perivascular  space on prior MRI of the head. Stable expanded partially empty sella turcica. Vascular: Calcific atherosclerosis of the carotid siphons and vertebral arteries. No hyperdense vessel identified. Skull: Normal. Negative for fracture or focal lesion. Sinuses/Orbits: Mild mucosal thickening of left maxillary sinus and left anterior ethmoid air cells. Normal aeration of mastoid air cells. Orbits are unremarkable. Other: None. ASPECTS [facility] Stroke Program Early CT Score) - Ganglionic level infarction (caudate, lentiform nuclei, internal capsule, insula, M1-M3 cortex): 7 - Supraganglionic infarction (M4-M6 cortex): 3 Total score (0-10 with 10 being normal): 10 IMPRESSION: 1. No acute intracranial abnormality identified. Stable expanded partially empty sella turcica. Otherwise unremarkable CT of the head for age. 2. ASPECTS is 10 These results were called by telephone at the time of interpretation on 01/18/2019 at 6:34 pm to Dr. Rory Percy, who verbally acknowledged these results. Electronically Signed   By: Kristine Garbe M.D.   On: 01/18/2019 18:35    PHYSICAL EXAM  pleasant obese elderly African American lady not in distress. . Afebrile. Head is nontraumatic. Neck is supple without bruit.    Cardiac exam no murmur or gallop. Lungs are clear to auscultation. Distal pulses are well felt. Neurological Exam :  Awake alert oriented to time place and person. No dysarthria or aphasia or apraxia. Extraocular moments are full range without nystagmus. Blinks to threat bilaterally. Fundi not visualized. Face is symmetric without weakness. Tongue midline. Motor system exam reveals mild left lower extremity drift. Good proximal strength bilaterally. Mild weakness of left grip and intrinsic hand muscles and left ankle dorsiflexors and hip flexors only. Sensation appears preserved bilaterally. Deep tendon reflexes are symmetric. Plantars are downgoing. Gait not tested.     ASSESSMENT/PLAN Ms.  Alicia [name] is a 73 y.o.  female with history of HTN, HLD, DB, CAD, obesity presenting with L sided weakness and slurred speech. Received IV tPA 01/18/2019 at [facility].  Stroke:  Likely R brain infarct s/p IV tPA, workup underway  Code Stroke CT head No acute stroke. Small vessel disease. Atrophy. ASPECTS 10.     CTA head no LVO. atherosclerosis w/ B paraclinoid ICA and L M1 mild stenosis  CTA neck no LVO. Mild B ICA beading. Mod cervical spndylosis  MRI  pending   2D Echo  pending   LDL 139  HgbA1c 7.7  SCDs for VTE prophylaxis  aspirin 81 mg daily and clopidogrel 75 mg daily prior to admission, now on No antithrombotic as within 24 h tPA administration. Plan to resume both agents if imaging negative for hemorrhage.   Therapy recommendations:  Pending. Ok to be OOB  Disposition:  pending   Hypertension  Required cleviprex treatment pre-tPA  On multiple home BP meds  Resume home meds  Wean Cleviprex as able  Current BP goals per post thrombolytic orders . Long-term BP goal normotensive  Hyperlipidemia  Home meds:  lipitor 80, resumed in hospital  LDL 139, goal < 70  May need to consider alternative treatments as an OP   Continue statin at discharge  Diabetes type II  HgbA1c 7.7, goal < 7.0  Uncontrolled  SSI  CBGs  Resume home meds except for metformin  Other Stroke Risk Factors  Advanced age  Morbid Obesity, Body mass index is 49.71 kg/m., recommend weight loss, diet and exercise as appropriate   Family hx stroke (father)  Coronary artery disease PCI, DES  Other Active Problems  Possible AKI on CKD Cr 1.8. recheck in am. On NS at [facility] day # 2    She presented with left-sided weakness likely related to right brain subcortical infarct etiology likely small vessel disease. She received IV tPA and appears to obtain substantial improvement.  Check MRI scan of the brain later today with sedation,.patientwas a screen failure in the sleep smart study on the [facility] 3  monitor last night.. Anticipate likely discharge home or to rehabilitation in the next few days.  Greater than 50% time during this 25 minute visit was spent on counseling and coordination of care about her stroke and answering questions   Antony Contras, V[place] Pager: [phone] 01/20/2019 4:58 PM  To contact Stroke Continuity provider, please refer to [email or web address] After hours, contact General Neurology

## 2019-01-20 NOTE — Progress Notes (Signed)
Pt's NIH improved from this morning after performing again for follow up. NP Biby responded to previous page and aware of situation. Will continue to monitor.

## 2019-01-20 NOTE — Progress Notes (Addendum)
Inpatient Diabetes Program Recommendations  AACE/ADA: New Consensus Statement on Inpatient Glycemic Control (2015)  Target Ranges:  Prepandial:   less than 140 mg/dL      Peak postprandial:   less than 180 mg/dL (1-2 hours)      Critically ill patients:  140 - 180 mg/dL   Lab Results  Component Value Date   GLUCAP 232 (H) 01/20/2019   HGBA1C 7.7 (H) 01/19/2019    Review of Glycemic Control Results for Alicia [name], Alicia [name] (MRN [id number]) as of 01/20/2019 13:28  Ref. Range 01/19/2019 08:14 01/19/2019 11:48 01/19/2019 17:35 01/19/2019 22:21 01/20/2019 11:26  Glucose-Capillary Latest Ref Range: 70 - 99 mg/dL 266 (H) 296 (H) 205 (H) 179 (H) 232 (H)   Diabetes history: DM 2 Outpatient Diabetes medications:  Glucotrol 10 mg bid, Metformin 1000 mg bid, Januvia 100 mg daily Current orders for Inpatient glycemic control:  Glucotrol 10 mg bid, Tradjenta 5 mg daily, Novolog resistant tid with meals  Inpatient Diabetes Program Recommendations:    Please consider d/c of Glucotrol while patient is in the hospital.  Also may consider adding Lantus 20 units daily.   Thanks  Adah Perl, RN, BC-ADM Inpatient Diabetes Coordinator Pager [phone] (8a-5p)

## 2019-01-20 NOTE — Progress Notes (Signed)
Paged NP Biby about pt's NIH showing significant change when checked this morning. Will continue to monitor patient.

## 2019-01-20 NOTE — Progress Notes (Addendum)
Physical Therapy Treatment Patient Details Name: Alicia [name] MRN: [id number] DOB: 1946-06-27 Today's Date: 01/20/2019    History of Present Illness Alicia [name] is an 73 y.o. female  With PMH DM 2, HTN, HLD, CAD, obesity who presented to [facility] ED as a code stroke with c/o left side weakness and slurred speech. pt given tPA at 18:39 3/1. Initial CT  (01/18/2019) negative. CTA head and neck reveal no emergent LVO. CT on 01/19/2019 Age indeterminate RIGHT basal ganglia lacunar infarct. MRI pending.    PT Comments    Patient progressing slowly towards PT goals. Reports her LLE feels weaker than yesterday however pt still able to tolerate gait training with Min A for balance/safety. May need RW for support pending progress as pt holding onto IV pole for support today. No knee buckling noted. Of note, pt with episode of dizziness when turning to the right requiring assist to prevent fall. This sensation of unsteadiness did not resolve until pt returned to sitting. Sitting BP 134/63, HR 67 bpm, standing BP 128/87, HR 75 bpm and BP post activity 136/63 in sitting. Reports some word finding difficulties at times. Deficits seem to be waxing and waning throughout the morning. RN aware. Will follow.    Follow Up Recommendations  Home health PT;Supervision/Assistance - 24 hour     Equipment Recommendations  Other (comment)(TBD pending progress)    Recommendations for Other Services       Precautions / Restrictions Precautions Precautions: Fall Precaution Comments: dizziness with turns Restrictions Weight Bearing Restrictions: No    Mobility  Bed Mobility Overal bed mobility: Modified Independent       Supine to sit: HOB elevated;Modified independent (Device/Increase time)     General bed mobility comments: up in chair upon PT arrival.   Transfers Overall transfer level: Needs assistance Equipment used: None Transfers: Sit to/from Stand Sit to Stand: Min guard         General transfer  comment: Min guard for safety. Stood from Financial planner for support. from toilet x1. No dizziness. See BP in assessment.  Ambulation/Gait Ambulation/Gait assistance: Min assist Gait Distance (Feet): 120 Feet Assistive device: IV Pole Gait Pattern/deviations: Step-through pattern;Decreased stride length;Trunk flexed;Wide base of support Gait velocity: decreased   General Gait Details: Slow, mildly unsteady gait holding onto IV pole for support; LOB when turning right, Min A for safety. + dizziness. Cues for upright postuer due to progressive trunk flexion esp when fatigued.    Stairs             Wheelchair Mobility    Modified Rankin (Stroke Patients Only) Modified Rankin (Stroke Patients Only) Pre-Morbid Rankin Score: No significant disability Modified Rankin: Moderately severe disability     Balance Overall balance assessment: Needs assistance Sitting-balance support: No upper extremity supported;Feet supported Sitting balance-Leahy Scale: Good     Standing balance support: During functional activity Standing balance-Leahy Scale: Fair Standing balance comment: pt able to wash hands at sink without difficulty.                             Cognition Arousal/Alertness: Awake/alert Behavior During Therapy: Flat affect Overall Cognitive Status: No family/caregiver present to determine baseline cognitive functioning                                 General Comments: Seems WFL for basic mobility  tasks except reports some word finding difficulties at times; for ex. a friend from church was in her room initially and took her a second to state her name.       Exercises Other Exercises Other Exercises: Pt with equal finger to nose today with RUE, finger opposition she has to think a little more about. Using LUE functionally without issues despite reporting she cannot feel her palm or fingers when her eyes are shut and sensation is  tested. Was able to ambulate and not drop washcloth in left hand while doing so (even when she became dizzy). I reiterated what I told her yesterday as far as not using LUE soley for holding anything heavy, breakable, and/or hot.    General Comments General comments (skin integrity, edema, etc.): Reports LLE is weaker today than yesterday. Also states continued numbness LLE.       Pertinent Vitals/Pain Pain Assessment: No/denies pain    Home Living                      Prior Function            PT Goals (current goals can now be found in the care plan section) Progress towards PT goals: Progressing toward goals(slowly)    Frequency    Min 4X/week      PT Plan Current plan remains appropriate    Co-evaluation              AM-PAC PT "6 Clicks" Mobility   Outcome Measure  Help needed turning from your back to your side while in a flat bed without using bedrails?: None Help needed moving from lying on your back to sitting on the side of a flat bed without using bedrails?: None Help needed moving to and from a bed to a chair (including a wheelchair)?: None Help needed standing up from a chair using your arms (e.g., wheelchair or bedside chair)?: A Little Help needed to walk in hospital room?: A Little Help needed climbing 3-5 steps with a railing? : A Little 6 Click Score: 21    End of Session Equipment Utilized During Treatment: Gait belt Activity Tolerance: Treatment limited secondary to medical complications (Comment)(dizziness) Patient left: in chair;with call bell/phone within reach;with chair alarm set Nurse Communication: Mobility status PT Visit Diagnosis: Unsteadiness on feet (R26.81);Hemiplegia and hemiparesis Hemiplegia - Right/Left: Left Hemiplegia - dominant/non-dominant: Non-dominant Hemiplegia - caused by: Cerebral infarction     Time: 1219-1242 PT Time Calculation (min) (ACUTE ONLY): 23 min  Charges:  $Gait Training: 8-22  mins $Therapeutic Activity: 8-22 mins                     Wray Kearns, [place], DPT Acute Rehabilitation Services Pager [phone] Office [place] 01/20/2019, 12:56 PM

## 2019-01-20 NOTE — Plan of Care (Signed)
Patient progressing well

## 2019-01-20 NOTE — Progress Notes (Signed)
Occupational Therapy Treatment Patient Details Name: Alicia [name] MRN: [id number] DOB: [birth year] Today's Date: 01/20/2019    History of present illness Alicia [name] is an 73 y.o. female  With PMH DM 2, HTN, HLD, CAD, obesity who presented to [facility] ED as a code stroke with c/o left side weakness and slurred speech. pt given tPA at 18:39 3/1. Initial CT  (01/18/2019) negative. CTA head and neck reveal no emergent LVO. CT on 01/19/2019 Age indeterminate RIGHT basal ganglia lacunar infarct   OT comments  This 73 yo female admitted with above presents to acute OT with increased sensation on dorsum of left hand, but continues with numbness on ventral side (palm and fingers)--however this is not interfering with function. She still is a little unsteady on her feet and did have an episode of dizziness today with turning in hallway so she will continue to benefit from acute OT with follow up [place].  I have discussed the patient's current level of function related to self care with the patient.  She acknowledges understanding of this and feels her brother and sister whom she lives with can provide the level of care she will need at home.      Follow Up Recommendations  Home health OT;Supervision/Assistance - 24 hour    Equipment Recommendations  None recommended by OT       Precautions / Restrictions Precautions Precautions: Fall Restrictions Weight Bearing Restrictions: No       Mobility Bed Mobility Overal bed mobility: Modified Independent       Supine to sit: HOB elevated;Modified independent (Device/Increase time)     General bed mobility comments: No increased time today  Transfers Overall transfer level: Needs assistance Equipment used: None Transfers: Sit to/from Stand Sit to Stand: Min guard         General transfer comment: Had one episode of instabilty when she turned at end of hallway (she felt dizzy); chair brought up behind her and she sat for ~1 minute and then said she  was ready to get up and go again    Balance Overall balance assessment: Needs assistance Sitting-balance support: No upper extremity supported;Feet supported Sitting balance-Leahy Scale: Good     Standing balance support: No upper extremity supported;During functional activity Standing balance-Leahy Scale: Fair Standing balance comment: pt able to wash hands, face, and do oral care at sink without LOB                           ADL either performed or assessed with clinical judgement   ADL Overall ADL's : Needs assistance/impaired   Eating/Feeding Details (indicate cue type and reason): Pt able to use LUE to take lids off of soup and drink while at the same time using RUE to talk on phone Grooming: Supervision/safety;Standing;Wash/dry face;Wash/dry hands;Oral care Grooming Details (indicate cue type and reason): Pt using LUE appropriately, not dropping objects, able to open toothpaste              Lower Body Dressing: Min guard;Sit to/from stand Lower Body Dressing Details (indicate cue type and reason): Pt able to bend forward and use both hands to doff and don socks without loss of grip with LUE Toilet Transfer: Min guard;Ambulation                   Vision Baseline Vision/History: Wears glasses Wears Glasses: Distance only  Cognition Arousal/Alertness: Awake/alert Behavior During Therapy: Flat affect Overall Cognitive Status: Within Functional Limits for tasks assessed                                          Exercises Other Exercises Other Exercises: Pt with equal finger to nose today with RUE, finger opposition she has to think a little more about. Using LUE functionally without issues despite reporting she cannot feel her palm or fingers when her eyes are shut and sensation is tested. Was able to ambulate and not drop washcloth in left hand while doing so (even when she became dizzy). I reiterated what I told her  yesterday as far as not using LUE soley for holding anything heavy, breakable, and/or hot.           Pertinent Vitals/ Pain       Pain Assessment: No/denies pain         Frequency  Min 2X/week        Progress Toward Goals  OT Goals(current goals can now be found in the care plan section)  Progress towards OT goals: Progressing toward goals     Plan Discharge plan remains appropriate       AM-PAC OT "6 Clicks" Daily Activity     Outcome Measure   Help from another person eating meals?: None Help from another person taking care of personal grooming?: A Little Help from another person toileting, which includes using toliet, bedpan, or urinal?: A Little Help from another person bathing (including washing, rinsing, drying)?: A Little Help from another person to put on and taking off regular upper body clothing?: A Little Help from another person to put on and taking off regular lower body clothing?: A Little 6 Click Score: 19    End of Session Equipment Utilized During Treatment: Gait belt  OT Visit Diagnosis: Unsteadiness on feet (R26.81);Muscle weakness (generalized) (M62.81)   Activity Tolerance Patient tolerated treatment well(with one limited episode of dizziness)   Patient Left in chair;with call bell/phone within reach;with chair alarm set   Nurse Communication (pt with one episode of dizziness in hallway)        Time: 9417-4081 OT Time Calculation (min): 42 min  Charges: OT General Charges $OT Visit: 1 Visit OT Treatments $Self Care/Home Management : 38-52 mins  [place], OTR/L Acute [employer] Pager [phone] Office [phone]      Almon Register 01/20/2019, 11:57 AM

## 2019-01-20 NOTE — Evaluation (Signed)
SLP Cancellation Note  Patient Details Name: Adonai Helzer MRN: [id number] DOB: [birth year]   Cancelled treatment:       Reason Eval/Treat Not Completed: Other (comment);Patient at procedure or test/unavailable(pt working with OT, will continue efforts)   Macario Golds 01/20/2019, 12:06 PM  Luanna Salk, [place] [facility] SLP [place] Pager [phone] Office [phone]

## 2019-01-20 NOTE — Care Management Note (Addendum)
Case Management Note  Patient Details  Name: Alicia [name] MRN: [id number] Date of Birth: 1946-01-03  Subjective/Objective:     Pt admitted with a code stoke -c/o left side weakness and slurred speech - pt is s/p tpa               Action/Plan:   PTA independent from home with siblings - pt will have 34 supervision at discharge . Pt also has a life alert bracelet.   Pt did not need assistive devices for ambulation prior to admit.  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  Pt has PCP - Dr Gala Lewandowsky at [facility] and denied barriers with paying for prescription medications.  HH recommended - CM provided medicare.gov list to pt- pt chose [facility] - agency contacted and referral declined.   Pts second choice was [facility] - agency accepted referral.   CM requested order from attending  Expected Discharge Date:  01/22/19               Expected Discharge Plan:  [place]  In-House Referral:     Discharge planning Services  CM Consult  Post Acute Care Choice:    Choice offered to:  Patient  DME Arranged:    DME Agency:     HH Arranged:  PT, Speech Therapy HH Agency:  [facility]  Status of Service:  In process, will continue to follow  If discussed at Long Length of Stay Meetings, dates discussed:    Additional Comments: 01/22/2019 CM informed [facility] of discharge home today  Maryclare Labrador, RN 01/22/2019, 11:36 AM

## 2019-01-21 ENCOUNTER — Inpatient Hospital Stay (HOSPITAL_COMMUNITY): Payer: Medicare Other

## 2019-01-21 LAB — GLUCOSE, CAPILLARY
GLUCOSE-CAPILLARY: 210 mg/dL — AB (ref 70–99)
Glucose-Capillary: 58 mg/dL — ABNORMAL LOW (ref 70–99)
Glucose-Capillary: 74 mg/dL (ref 70–99)
Glucose-Capillary: 160 mg/dL — ABNORMAL HIGH (ref 70–99)
Glucose-Capillary: 161 mg/dL — ABNORMAL HIGH (ref 70–99)

## 2019-01-21 NOTE — Progress Notes (Signed)
STROKE TEAM PROGRESS NOTE   INTERVAL HISTORY  .she is sitting in the bedside chair. She states he would rather go home tomorrow as she does not have any help.  Vitals:   01/21/19 0035 01/21/19 0400 01/21/19 0834 01/21/19 1225  BP: 125/62 (!) 164/95 (!) 120/98 (!) 143/73  Pulse: 69 64 67 62  Resp: 18 18 18 18   Temp: 97.6 F (36.4 C) 97.6 F (36.4 C) (!) 97.5 F (36.4 C) 97.8 F (36.6 C)  TempSrc: Oral Axillary Oral Oral  SpO2: 100% 100% 98% 100%  Weight:      Height:        CBC:  Recent Labs  Lab 01/18/19 1820 01/20/19 0529  WBC 4.6 7.9  NEUTROABS 1.9  --   HGB 9.8* 9.4*  HCT 34.1* 31.6*  MCV 75.1* 73.5*  PLT 231 811    Basic Metabolic Panel:  Recent Labs  Lab 01/18/19 1820 01/18/19 1838 01/20/19 0529  NA 140  --  139  K 4.1  --  4.0  CL 104  --  107  CO2 27  --  25  GLUCOSE 134*  --  150*  BUN 16  --  20  CREATININE 1.80* 1.80* 1.61*  CALCIUM 8.8*  --  8.1*   Lipid Panel:     Component Value Date/Time   CHOL 199 01/19/2019 0347   TRIG 49 01/19/2019 0347   HDL 50 01/19/2019 0347   CHOLHDL 4.0 01/19/2019 0347   VLDL 10 01/19/2019 0347   LDLCALC 139 (H) 01/19/2019 0347   HgbA1c:  Lab Results  Component Value Date   HGBA1C 7.7 (H) 01/19/2019    IMAGING Ct Head Wo Contrast  Result Date: 01/19/2019 CLINICAL DATA:  Follow up stroke. History of hypertension, hyperlipidemia and diabetes. EXAM: CT HEAD WITHOUT CONTRAST TECHNIQUE: Contiguous axial images were obtained from the base of the skull through the vertex without intravenous contrast. COMPARISON:  CT HEAD January 18, 2019 and MRI head July 15, 2017 FINDINGS: BRAIN: No intraparenchymal hemorrhage, mass effect nor midline shift. No parenchymal brain volume loss for age. No hydrocephalus. Faint supratentorial white matter hypodensities less than expected for patient's age, though non-specific are most compatible with chronic small vessel ischemic disease. Age indeterminate RIGHT basal ganglia lacunar  infarct. No acute large vascular territory infarcts. No abnormal extra-axial fluid collections. Basal cisterns are patent. VASCULAR: Moderate calcific atherosclerosis of the carotid siphons. SKULL: No skull fracture. No significant scalp soft tissue swelling. SINUSES/ORBITS: Trace paranasal sinus mucosal thickening. LEFT ethmoid mucosal retention cyst. Mastoid air cells are well aerated.The included ocular globes and orbital contents are non-suspicious. OTHER: None. IMPRESSION: 1. Age indeterminate RIGHT basal ganglia lacunar infarct. 2. Otherwise negative non-contrast CT HEAD for age. Electronically Signed   By: Elon Alas M.D.   On: 01/19/2019 18:47   Mr Brain Wo Contrast  Result Date: 01/21/2019 CLINICAL DATA:  Left-sided weakness and slurred speech EXAM: MRI HEAD WITHOUT CONTRAST TECHNIQUE: Multiplanar, multiecho pulse sequences of the brain and surrounding structures were obtained without intravenous contrast. COMPARISON:  Head CT 01/19/2019 FINDINGS: BRAIN: There is no acute infarct, acute hemorrhage, hydrocephalus or extra-axial collection. Unchanged appearance of old right basal ganglia small vessel infarct. No midline shift or other mass effect. The white matter signal is normal for the patient's age. The cerebral and cerebellar volume are age-appropriate. Susceptibility-sensitive sequences show no chronic microhemorrhage or superficial siderosis. VASCULAR: Major intracranial arterial and venous sinus flow voids are normal. SKULL AND UPPER CERVICAL SPINE: Calvarial bone marrow  signal is normal. There is no skull base mass. Visualized upper cervical spine and soft tissues are normal. SINUSES/ORBITS: No fluid levels or advanced mucosal thickening. No mastoid or middle ear effusion. The orbits are normal. IMPRESSION: Unchanged examination without acute abnormality. Old right basal ganglia small vessel infarct. Electronically Signed   By: Ulyses Jarred M.D.   On: 01/21/2019 01:35    PHYSICAL EXAM   pleasant obese elderly African American lady not in distress. . Afebrile. Head is nontraumatic. Neck is supple without bruit.    Cardiac exam no murmur or gallop. Lungs are clear to auscultation. Distal pulses are well felt. Neurological Exam :  Awake alert oriented to time place and person. No dysarthria or aphasia or apraxia. Extraocular moments are full range without nystagmus. Blinks to threat bilaterally. Fundi not visualized. Face is symmetric without weakness. Tongue midline. Motor system exam reveals mild left lower extremity drift. Good proximal strength bilaterally. Mild weakness of left grip and intrinsic hand muscles and left ankle dorsiflexors and hip flexors only. Sensation appears preserved bilaterally. Deep tendon reflexes are symmetric. Plantars are downgoing. Gait not tested.     ASSESSMENT/PLAN Ms. Alicia [name] is a 73 y.o. female with history of HTN, HLD, DB, CAD, obesity presenting with L sided weakness and slurred speech. Received IV tPA 01/18/2019 at [place].   Stroke:  Likely R brain infarct s/p IV tPA, but not seen on MRI  Code Stroke CT head No acute stroke. Small vessel disease. Atrophy. ASPECTS 10.     CTA head no LVO. atherosclerosis w/ B paraclinoid ICA and L M1 mild stenosis  CTA neck no LVO. Mild B ICA beading. Mod cervical spndylosis  MRI No acute infarct. Old right basal ganglia infarct.  2D Echo  Ejection fraction 60-65%. No clot.LDL 139  HgbA1c 7.7  SCDs for VTE prophylaxis  aspirin 81 mg daily and clopidogrel 75 mg daily prior to admission, now on No antithrombotic as within 24 h tPA administration. Plan to resume both agents if imaging negative for hemorrhage.  Therapy recommendations:  Home Disposition:  phoe Hypertension  Required cleviprex treatment pre-tPA  On multiple home BP meds  Resume home meds  Wean Cleviprex as able  Current BP goals per post thrombolytic orders . Long-term BP goal normotensive  Hyperlipidemia  Home meds:   lipitor 80, resumed in hospital  LDL 139, goal < 70  May need to consider alternative treatments as an OP   Continue statin at discharge  Diabetes type II  HgbA1c 7.7, goal < 7.0  Uncontrolled  SSI  CBGs  Resume home meds except for metformin  Other Stroke Risk Factors  Advanced age  Morbid Obesity, Body mass index is 49.71 kg/m., recommend weight loss, diet and exercise as appropriate   Family hx stroke (father)  Coronary artery disease PCI, DES  Other Active Problems  Possible AKI on CKD Cr 1.8. recheck in am. On NS at [facility] day # 3    She presented with left-sided weakness likely related to right brain subcortical infarct etiology likely small vessel disease. She received IV tPA and appears to obtain substantial improvement.   Anticipate likely discharge home tomorrow    Antony Contras, [place] Pager: [phone] 01/21/2019 2:07 PM  To contact Stroke Continuity provider, please refer to [email or web address] After hours, contact General Neurology

## 2019-01-21 NOTE — Progress Notes (Signed)
Physical Therapy Treatment Patient Details Name: Alicia [name] MRN: [id number] DOB: 04/05/1946 Today's Date: 01/21/2019    History of Present Illness Alicia [name] is an 73 y.o. female  With PMH DM 2, HTN, HLD, CAD, obesity who presented to [facility] ED as a code stroke with c/o left side weakness and slurred speech. pt given tPA at 18:39 3/1. Initial CT  (01/18/2019) negative. CTA head and neck reveal no emergent LVO. CT on 01/19/2019 Age indeterminate RIGHT basal ganglia lacunar infarct. MRI pending.    PT Comments    Emphasis on gait stability and endurance, mostly with AD, but short distance with no device and all with min assist.   Follow Up Recommendations  Home health PT;Supervision/Assistance - 24 hour     Equipment Recommendations  Other (comment)(TBA, maybe a SPC)    Recommendations for Other Services       Precautions / Restrictions Precautions Precautions: Fall Restrictions Weight Bearing Restrictions: No    Mobility  Bed Mobility               General bed mobility comments: up in chair upon PT arrival.   Transfers Overall transfer level: Needs assistance   Transfers: Sit to/from Stand Sit to Stand: Min guard            Ambulation/Gait Ambulation/Gait assistance: Min assist Gait Distance (Feet): 280 Feet(then pt became fatigued, almost pre syncopal.  40 more feet.) Assistive device: IV Pole;None Gait Pattern/deviations: Step-through pattern;Decreased step length - right;Decreased step length - left;Decreased stance time - left Gait velocity: decreased Gait velocity interpretation: <1.31 ft/sec, indicative of household ambulator General Gait Details: slower mildly unsteady overall.  Notably more guarded and mild more unsteady without any AD and characterized as paretic.   Stairs             Wheelchair Mobility    Modified Rankin (Stroke Patients Only) Modified Rankin (Stroke Patients Only) Modified Rankin: Moderately severe disability      Balance Overall balance assessment: Needs assistance   Sitting balance-Leahy Scale: Good       Standing balance-Leahy Scale: Fair                              Cognition Arousal/Alertness: Awake/alert Behavior During Therapy: WFL for tasks assessed/performed Overall Cognitive Status: No family/caregiver present to determine baseline cognitive functioning                                        Exercises      General Comments        Pertinent Vitals/Pain      Home Living                      Prior Function            PT Goals (current goals can now be found in the care plan section) Acute Rehab PT Goals Patient Stated Goal: want to be independent PT Goal Formulation: With patient Time For Goal Achievement: 02/02/19 Potential to Achieve Goals: Good Progress towards PT goals: Progressing toward goals    Frequency    Min 4X/week      PT Plan Current plan remains appropriate    Co-evaluation              AM-PAC PT "6 Clicks" Mobility  Outcome Measure  Help needed turning from your back to your side while in a flat bed without using bedrails?: None Help needed moving from lying on your back to sitting on the side of a flat bed without using bedrails?: None Help needed moving to and from a bed to a chair (including a wheelchair)?: None Help needed standing up from a chair using your arms (e.g., wheelchair or bedside chair)?: A Little Help needed to walk in hospital room?: A Little Help needed climbing 3-5 steps with a railing? : A Little 6 Click Score: 21    End of Session   Activity Tolerance: Patient tolerated treatment well;Patient limited by fatigue Patient left: in chair;with call bell/phone within reach;with chair alarm set Nurse Communication: Mobility status PT Visit Diagnosis: Unsteadiness on feet (R26.81);Other symptoms and signs involving the nervous system (R29.898) Hemiplegia - Right/Left:  Left Hemiplegia - dominant/non-dominant: Non-dominant Hemiplegia - caused by: Cerebral infarction     Time: 4193-7902 PT Time Calculation (min) (ACUTE ONLY): 26 min  Charges:  $Gait Training: 8-22 mins $Therapeutic Activity: 8-22 mins                     01/21/2019  Alicia [name], PT [place] [phone]  (pager) [phone]  (office)   Alicia [name] Alicia [name] 01/21/2019, 1:37 PM

## 2019-01-21 NOTE — Evaluation (Signed)
Speech Language Pathology Evaluation Patient Details Name: Alicia [name] MRN: [id number] DOB: Dec 18, 1945 Today's Date: 01/21/2019 Time: 2951-8841 SLP Time Calculation (min) (ACUTE ONLY): 30 min  Problem List:  Patient Active Problem List   Diagnosis Date Noted  . OSA (obstructive sleep apnea) 01/20/2019  . Stroke [facility]) status post IV TPA 01/18/2019  . History of syncope 09/16/2018  . OA (osteoarthritis) of knee 12/20/2017  . Type 2 diabetes mellitus without complication, without long-term current use of insulin ([place]) 12/20/2017  . Progressive angina ([place])   . CAD S/P percutaneous coronary angioplasty 10/22/2015  . Essential hypertension 10/22/2015  . Microcytic anemia 10/22/2015  . Morbid obesity ([place]) 10/22/2015  . Hyperlipidemia 10/09/2012  . GERD (gastroesophageal reflux disease) 10/09/2012   Past Medical History:  Past Medical History:  Diagnosis Date  . Anemia   . Arthritis    "right knee" (10/21/2015)  . CAD (coronary artery disease)    a. Cath 10/21/15: s/p DES to RCA: moderate diffuse stenosis of mod diagonal, diffuse irregularity LCx and LAD. 2D echo 10/21/15: mod LVH, EF 60-65%, no RWMA, calcified MV, mod-severe LAE. 03/18/17 PCI with DES--> 1 diag  . Cataract   . Essential hypertension   . Family history of adverse reaction to anesthesia    "oldest sister, [name], related to brain formation at back of head; when they put her to sleep it's hard to wake her up"  . GERD (gastroesophageal reflux disease)   . History of blood transfusion    "related to ruptured tubal pregnancy"  . Hyperlipidemia   . Hypertensive heart disease   . Morbid obesity ([place])   . Type II diabetes mellitus ([place])    Past Surgical History:  Past Surgical History:  Procedure Laterality Date  . ABDOMINAL HYSTERECTOMY  1988  . APPENDECTOMY  1985  . [place] SURGERY  1984  . CARDIAC CATHETERIZATION N/A 10/21/2015   Procedure: Right/Left Heart Cath and Coronary Angiography;  Surgeon: [name], MD;  Location: [place] [facility];  Service: Cardiovascular;  Laterality: N/A;  . CHOLECYSTECTOMY OPEN  1984  . CORONARY STENT INTERVENTION N/A 03/18/2017   Procedure: Coronary Stent Intervention;  Surgeon: [name], MD;  Location: [place] [facility];  Service: Cardiovascular;  Laterality: N/A;  . CORONARY STENT PLACEMENT  03/18/2017   A OPTIMIZE STUDY Drug Eluting Stent (2.5 mm 18 mm - post-dilated to 2.7 mm) was successfully placed  . ECTOPIC PREGNANCY SURGERY    . LEFT HEART CATH AND CORONARY ANGIOGRAPHY N/A 03/18/2017   Procedure: Left Heart Cath and Coronary Angiography;  Surgeon: [name], MD;  Location: [place] [facility];  Service: Cardiovascular;  Laterality: N/A;  . REDUCTION MAMMAPLASTY Bilateral ~ 1976  . TONSILLECTOMY     HPI:  Alicia [name] is an 73 y.o. female  With PMH DM 2, HTN, HLD, CAD, obesity who presented to [facility] ED 01/18/19 as a code stroke with c/o left side weakness and slurred speech. 01/21/19 MRI showed unchanged examination without acute abnormality; Old right basal ganglia small vessel infarct.   Assessment / Plan / Recommendation Clinical Impression   Pt was seen for cognitive-linguistic evaluation today and appeared within functional limits during tasks assessed. Pt reported very mild memory and word-finding deficits at baseline level of functioning, however was independent and functional with all ADLs prior to admission. Of note, pt lives with her sister, who she is a primary caregiver for. During assessment, pt demonstrated basic problem solving, intellectual awareness, and short  term memory for tasks assessed. Pt required Min semantic (category) cues to recall 1 out of 4 words in a delayed recall task; all others were recalled independently. 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 No further acute ST needs identified, recommend HH ST follow up.    SLP Assessment  SLP Recommendation/Assessment: All further Speech Lanaguage Pathology  needs can be addressed in the next venue of care SLP Visit Diagnosis: Cognitive communication deficit (R41.841)    Follow Up Recommendations  Home health SLP    Frequency and Duration           SLP Evaluation Cognition  Overall Cognitive Status: Within Functional Limits for tasks assessed Arousal/Alertness: Awake/alert Orientation Level: Oriented X4 Attention: Sustained Sustained Attention: Appears intact Memory: Appears intact Awareness: Appears intact Problem Solving: Appears intact Safety/Judgment: Impaired       Comprehension  Auditory Comprehension Overall Auditory Comprehension: Appears within functional limits for tasks assessed Yes/No Questions: Not tested Commands: Not tested Conversation: Simple Visual Recognition/Discrimination Discrimination: Not tested Reading Comprehension Reading Status: Not tested    Expression Expression Primary Mode of Expression: Verbal Verbal Expression Overall Verbal Expression: Appears within functional limits for tasks assessed Initiation: No impairment Level of Generative/Spontaneous Verbalization: Conversation Naming: Not tested Pragmatics: No impairment Non-Verbal Means of Communication: Not applicable Written Expression Dominant Hand: Right Written Expression: Not tested   Oral / Motor  Oral Motor/Sensory Function Overall Oral Motor/Sensory Function: Within functional limits Motor Speech Overall Motor Speech: Appears within functional limits for tasks assessed Respiration: Within functional limits Phonation: Normal Resonance: Within functional limits Articulation: Within functional limitis Intelligibility: Intelligible Motor Planning: Witnin functional limits Motor Speech Errors: Not applicable   Jettie Booze, Student SLP                      Jettie Booze 01/21/2019, 4:16 PM

## 2019-01-22 LAB — GLUCOSE, CAPILLARY: Glucose-Capillary: 140 mg/dL — ABNORMAL HIGH (ref 70–99)

## 2019-01-22 MED ORDER — LINAGLIPTIN 5 MG PO TABS: 5.0000 mg | ORAL_TABLET | Freq: Every day | ORAL | 2 refills | Status: DC

## 2019-01-22 MED ORDER — ISOSORBIDE MONONITRATE ER 30 MG PO TB24: 30.0000 mg | ORAL_TABLET | Freq: Every day | ORAL | 1 refills | Status: DC

## 2019-01-22 MED ORDER — MELATONIN 3 MG PO TABS
3.0000 mg | ORAL_TABLET | Freq: Once | ORAL | Status: AC
  Administered 2019-01-22: 3 mg via ORAL
  Filled 2019-01-22: qty 1

## 2019-01-22 MED ORDER — SPIRONOLACTONE 25 MG PO TABS: 25.0000 mg | ORAL_TABLET | Freq: Every day | ORAL | 2 refills | Status: DC

## 2019-01-22 MED ORDER — GLIPIZIDE 10 MG PO TABS: 10.0000 mg | ORAL_TABLET | Freq: Two times a day (BID) | ORAL | 2 refills | Status: DC

## 2019-01-22 MED ORDER — CLOPIDOGREL BISULFATE 75 MG PO TABS: 75.0000 mg | ORAL_TABLET | Freq: Every day | ORAL | 0 refills | Status: DC

## 2019-01-22 NOTE — Plan of Care (Signed)
Pt is progressing toward expected goals for this admission

## 2019-01-22 NOTE — Care Management Important Message (Signed)
Important Message  Patient Details  Name: Alicia [name] MRN: [id number] Date of Birth: 03/09/1946   Medicare Important Message Given:  Yes    Orbie Pyo 01/22/2019, 1:59 PM

## 2019-01-22 NOTE — Progress Notes (Signed)
Physical Therapy Treatment Patient Details Name: Alicia [name] MRN: [id number] DOB: [birth year] Today's Date: 01/22/2019    History of Present Illness Alicia [name] is an 73 y.o. female  With PMH DM 2, HTN, HLD, CAD, obesity who presented to [facility] ED as a code stroke with c/o left side weakness and slurred speech. pt given tPA at 18:39 3/1. Initial CT  (01/18/2019) negative. CTA head and neck reveal no emergent LVO. CT on 01/19/2019 Age indeterminate RIGHT basal ganglia lacunar infarct. MRI pending.    PT Comments    Pt presented in recliner with OT finishing treatment. OT communicated that pt had ambulated with RW with them during session. Pt stated she was ready to go and agreed to attempt stairs prior to discharge. Pt stated she had a RW at home already. Pt ambulated with no AD for 100 feet w/ min guard. Pt educated and completed stairs in PT gym with min A and max cues for proper foot placement, rail use, and cadence. Pt vitals WNL during tx, but pt limited d/t fatigue and multiple visits from different disciplines today prior to upcoming d/c. Pt left in room seated on bed with sister present and all needs within reach. Pt discharge to [place] is appropriate at this time based on progress. Plan to progress pt left up to [facility] agency at this time.  Follow Up Recommendations  Home health PT;Supervision/Assistance - 24 hour     Equipment Recommendations  Other (comment)    Recommendations for Other Services       Precautions / Restrictions Precautions Precautions: Fall Precaution Comments: dizziness with turns Restrictions Weight Bearing Restrictions: No    Mobility  Bed Mobility Overal bed mobility: Modified Independent             General bed mobility comments: up in chair upon PT arrival.   Transfers Overall transfer level: Needs assistance Equipment used: None Transfers: Sit to/from Stand Sit to Stand: Min guard         General transfer comment: Min guard for  safety.  Ambulation/Gait Ambulation/Gait assistance: Min guard Gait Distance (Feet): 100 Feet Assistive device: None Gait Pattern/deviations: Step-through pattern;Decreased step length - right;Decreased step length - left;Decreased stance time - left;Trunk flexed Gait velocity: decreased   General Gait Details: slower mildly unsteady overall.  Notably more guarded and mild more unsteady without any AD and characterized as paretic. Cues given for posture.   Stairs Stairs: Yes Stairs assistance: Min assist Stair Management: Two rails Number of Stairs: 4 General stair comments: Pt required vc/tc for hand placement and orienting body square with stairs. Pt prefers to angle her body especially when descending.   Wheelchair Mobility    Modified Rankin (Stroke Patients Only)       Balance Overall balance assessment: Needs assistance Sitting-balance support: No upper extremity supported;Feet supported Sitting balance-Leahy Scale: Good     Standing balance support: No upper extremity supported Standing balance-Leahy Scale: Fair                              Cognition Arousal/Alertness: Awake/alert Behavior During Therapy: WFL for tasks assessed/performed Overall Cognitive Status: Within Functional Limits for tasks assessed                                 General Comments: Seems WFL for basic mobility tasks except reports some word finding  difficulties at times; for ex. a friend from church was in her room initially and took her a second to state her name.       Exercises      General Comments General comments (skin integrity, edema, etc.): sister present throughout session      Pertinent Vitals/Pain Pain Assessment: No/denies pain    Home Living                      Prior Function            PT Goals (current goals can now be found in the care plan section) Acute Rehab PT Goals Patient Stated Goal: want to be independent PT  Goal Formulation: With patient Potential to Achieve Goals: Good    Frequency    Min 4X/week      PT Plan Current plan remains appropriate    Co-evaluation              AM-PAC PT "6 Clicks" Mobility   Outcome Measure  Help needed turning from your back to your side while in a flat bed without using bedrails?: None Help needed moving from lying on your back to sitting on the side of a flat bed without using bedrails?: None Help needed moving to and from a bed to a chair (including a wheelchair)?: None Help needed standing up from a chair using your arms (e.g., wheelchair or bedside chair)?: A Little Help needed to walk in hospital room?: A Little Help needed climbing 3-5 steps with a railing? : A Little 6 Click Score: 21    End of Session Equipment Utilized During Treatment: Gait belt Activity Tolerance: Patient tolerated treatment well;Patient limited by fatigue Patient left: in bed;with call bell/phone within reach;with family/visitor present(dressed and awaiting discharge) Nurse Communication: Mobility status PT Visit Diagnosis: Unsteadiness on feet (R26.81);Other symptoms and signs involving the nervous system (R29.898) Hemiplegia - Right/Left: Left Hemiplegia - dominant/non-dominant: Non-dominant Hemiplegia - caused by: Cerebral infarction     Time: 1220-1232 PT Time Calculation (min) (ACUTE ONLY): 12 min  Charges:  $Gait Training: 8-22 mins                     Maryelizabeth Kaufmann, SPTA   Maryelizabeth Kaufmann 01/22/2019, 1:57 PM

## 2019-01-22 NOTE — Discharge Summary (Addendum)
Stroke Discharge Summary  Patient ID: Alicia [name]   MRN: [id number]      DOB: 1945/12/03  Date of Admission: 01/18/2019 Date of Discharge: 01/22/2019  Attending Physician:  Garvin Fila, MD, Stroke MD Consultant(s):   none Patient's PCP:  Jearld Fenton, NP  DISCHARGE DIAGNOSIS:  Principal Problem:   Stroke [facility]) status post IV TPA Active Problems:   CAD S/P percutaneous coronary angioplasty   Essential hypertension   Morbid obesity ([place])   Type 2 diabetes mellitus without complication, without long-term current use of insulin (HCC)   OSA (obstructive sleep apnea)   Past Medical History:  Diagnosis Date  . Anemia   . Arthritis    "right knee" (10/21/2015)  . CAD (coronary artery disease)    a. Cath 10/21/15: s/p DES to RCA: moderate diffuse stenosis of mod diagonal, diffuse irregularity LCx and LAD. 2D echo 10/21/15: mod LVH, EF 60-65%, no RWMA, calcified MV, mod-severe LAE. 03/18/17 PCI with DES--> 1 diag  . Cataract   . Essential hypertension   . Family history of adverse reaction to anesthesia    "oldest sister, [name], related to brain formation at back of head; when they put her to sleep it's hard to wake her up"  . GERD (gastroesophageal reflux disease)   . History of blood transfusion    "related to ruptured tubal pregnancy"  . Hyperlipidemia   . Hypertensive heart disease   . Morbid obesity ([place])   . Type II diabetes mellitus ([place])    Past Surgical History:  Procedure Laterality Date  . ABDOMINAL HYSTERECTOMY  1988  . APPENDECTOMY  1985  . [place] SURGERY  1984  . CARDIAC CATHETERIZATION N/A 10/21/2015   Procedure: Right/Left Heart Cath and Coronary Angiography;  Surgeon: [name], MD;  Location: [place] [facility];  Service: Cardiovascular;  Laterality: N/A;  . CHOLECYSTECTOMY OPEN  1984  . CORONARY STENT INTERVENTION N/A 03/18/2017   Procedure: Coronary Stent Intervention;  Surgeon: [name], MD;  Location: [place] [facility];  Service:  Cardiovascular;  Laterality: N/A;  . CORONARY STENT PLACEMENT  03/18/2017   A OPTIMIZE STUDY Drug Eluting Stent (2.5 mm 18 mm - post-dilated to 2.7 mm) was successfully placed  . ECTOPIC PREGNANCY SURGERY    . LEFT HEART CATH AND CORONARY ANGIOGRAPHY N/A 03/18/2017   Procedure: Left Heart Cath and Coronary Angiography;  Surgeon: [name], MD;  Location: [place] [facility];  Service: Cardiovascular;  Laterality: N/A;  . REDUCTION MAMMAPLASTY Bilateral ~ 1976  . TONSILLECTOMY      Allergies as of 01/22/2019      Reactions   Iodinated Diagnostic Agents Hives   Sulfa Antibiotics Hives      Medication List    STOP taking these medications   acetaminophen 325 MG tablet Commonly known as:  TYLENOL   doxazosin 4 MG tablet Commonly known as:  CARDURA   ferrous sulfate 325 (65 FE) MG tablet   furosemide 20 MG tablet Commonly known as:  LASIX   Insulin Pen Needle 31G X 5 MM Misc Commonly known as:  B-D UF III MINI PEN NEEDLES   metFORMIN 1000 MG tablet Commonly known as:  GLUCOPHAGE   multivitamin with minerals tablet   nitroGLYCERIN 0.4 MG SL tablet Commonly known as:  NITROSTAT   ONE TOUCH ULTRA TEST test strip Generic drug:  glucose blood   ONETOUCH DELICA LANCETS 62B Misc   sitaGLIPtin 100 MG tablet Commonly known  as:  JANUVIA Replaced by:  linagliptin 5 MG Tabs tablet   traMADol 50 MG tablet Commonly known as:  ULTRAM   UNISOM SLEEPGELS PO   VICTOZA 18 MG/3ML Sopn Generic drug:  liraglutide     TAKE these medications   aspirin 81 MG tablet Take 81 mg by mouth daily.   atorvastatin 80 MG tablet Commonly known as:  LIPITOR Take 1 tablet (80 mg total) by mouth daily at 6 PM.   carvedilol 25 MG tablet Commonly known as:  COREG Take 1 tablet (25 mg total) by mouth 2 (two) times daily.   clopidogrel 75 MG tablet Commonly known as:  PLAVIX Take 1 tablet (75 mg total) by mouth daily. Start taking on:  January 23, 2019   glipiZIDE 10 MG tablet Commonly  known as:  GLUCOTROL Take 1 tablet (10 mg total) by mouth 2 (two) times daily before a meal.   isosorbide mononitrate 30 MG 24 hr tablet Commonly known as:  IMDUR Take 1 tablet (30 mg total) by mouth daily. Start taking on:  January 23, 2019   linagliptin 5 MG Tabs tablet Commonly known as:  TRADJENTA Take 1 tablet (5 mg total) by mouth daily. Start taking on:  January 23, 2019 Replaces:  sitaGLIPtin 100 MG tablet   losartan 100 MG tablet Commonly known as:  COZAAR Take 1 tablet (100 mg total) by mouth daily.   spironolactone 25 MG tablet Commonly known as:  ALDACTONE Take 1 tablet (25 mg total) by mouth daily. Start taking on:  January 23, 2019       LABORATORY STUDIES CBC    Component Value Date/Time   WBC 7.9 01/20/2019 0529   RBC 4.30 01/20/2019 0529   HGB 9.4 (L) 01/20/2019 0529   HGB 10.6 (L) 03/15/2017 1341   HCT 31.6 (L) 01/20/2019 0529   HCT 34.7 03/15/2017 1341   PLT 219 01/20/2019 0529   PLT 208 03/15/2017 1341   MCV 73.5 (L) 01/20/2019 0529   MCV 72 (L) 03/15/2017 1341   MCH 21.9 (L) 01/20/2019 0529   MCHC 29.7 (L) 01/20/2019 0529   RDW 16.5 (H) 01/20/2019 0529   RDW 16.2 (H) 03/15/2017 1341   LYMPHSABS 2.1 01/18/2019 1820   MONOABS 0.5 01/18/2019 1820   EOSABS 0.1 01/18/2019 1820   BASOSABS 0.0 01/18/2019 1820   CMP    Component Value Date/Time   NA 139 01/20/2019 0529   NA 142 04/08/2017 1353   K 4.0 01/20/2019 0529   CL 107 01/20/2019 0529   CO2 25 01/20/2019 0529   GLUCOSE 150 (H) 01/20/2019 0529   BUN 20 01/20/2019 0529   BUN 19 04/08/2017 1353   CREATININE 1.61 (H) 01/20/2019 0529   CREATININE 1.55 (H) 08/10/2016 0850   CALCIUM 8.1 (L) 01/20/2019 0529   PROT 6.5 01/18/2019 1820   ALBUMIN 3.7 01/18/2019 1820   AST 16 01/18/2019 1820   ALT 11 01/18/2019 1820   ALKPHOS 116 01/18/2019 1820   BILITOT 0.7 01/18/2019 1820   GFRNONAA 32 (L) 01/20/2019 0529   GFRAA 37 (L) 01/20/2019 0529   COAGS Lab Results  Component Value Date   INR 1.1  01/18/2019   INR 1.15 07/14/2017   INR 1.0 03/15/2017   Lipid Panel    Component Value Date/Time   CHOL 199 01/19/2019 0347   TRIG 49 01/19/2019 0347   HDL 50 01/19/2019 0347   CHOLHDL 4.0 01/19/2019 0347   VLDL 10 01/19/2019 0347   LDLCALC 139 (H) 01/19/2019 0347  HgbA1C  Lab Results  Component Value Date   HGBA1C 7.7 (H) 01/19/2019   Urinalysis    Component Value Date/Time   COLORURINE STRAW (A) 07/13/2017 1517   APPEARANCEUR CLEAR 07/13/2017 1517   LABSPEC 1.009 07/13/2017 1517   PHURINE 5.0 07/13/2017 1517   GLUCOSEU NEGATIVE 07/13/2017 1517   HGBUR NEGATIVE 07/13/2017 1517   BILIRUBINUR NEGATIVE 07/13/2017 1517   KETONESUR NEGATIVE 07/13/2017 1517   PROTEINUR NEGATIVE 07/13/2017 1517   UROBILINOGEN 0.2 10/12/2011 1726   NITRITE NEGATIVE 07/13/2017 1517   LEUKOCYTESUR NEGATIVE 07/13/2017 1517   Urine Drug Screen     Component Value Date/Time   LABOPIA POSITIVE (A) 05/23/2007 2219   COCAINSCRNUR NONE DETECTED 05/23/2007 2219   LABBENZ NONE DETECTED 05/23/2007 2219   AMPHETMU NONE DETECTED 05/23/2007 2219   THCU NONE DETECTED 05/23/2007 2219   LABBARB  05/23/2007 2219    NONE DETECTED        DRUG SCREEN FOR MEDICAL PURPOSES ONLY.  IF CONFIRMATION IS NEEDED FOR ANY PURPOSE, NOTIFY LAB WITHIN 5 DAYS.    Alcohol Level No results found for: [facility]   Ct Angio Head W Or Wo Contrast  Result Date: 01/18/2019 CLINICAL DATA:  73 y/o  F; left-sided weakness. Code stroke. EXAM: CT ANGIOGRAPHY HEAD AND NECK TECHNIQUE: Multidetector CT imaging of the head and neck was performed using the standard protocol during bolus administration of intravenous contrast. Multiplanar CT image reconstructions and MIPs were obtained to evaluate the vascular anatomy. Carotid stenosis measurements (when applicable) are obtained utilizing NASCET criteria, using the distal internal carotid diameter as the denominator. CONTRAST:  72mL OMNIPAQUE IOHEXOL 350 MG/ML SOLN COMPARISON:  01/18/2019 CT  head. FINDINGS: CTA NECK FINDINGS Aortic arch: Bovine variant branching. Imaged portion shows no evidence of aneurysm or dissection. No significant stenosis of the major arch vessel origins. Right carotid system: No evidence of dissection, stenosis (50% or greater) or occlusion. Mildly beaded non stenotic contour of the internal carotid arteries. Mild non stenotic calcific atherosclerosis of the carotid bifurcation. Left carotid system: No evidence of dissection, stenosis (50% or greater) or occlusion. Mildly beaded non stenotic contour of the internal carotid arteries. Mild non stenotic calcific atherosclerosis of the carotid bifurcation. Vertebral arteries: Left dominant. No evidence of dissection, stenosis (50% or greater) or occlusion. Skeleton: Moderate spondylosis of the cervical spine with multilevel disc and facet degenerative changes. No significant bony spinal canal stenosis. Uncovertebral and facet hypertrophy result in bony neural foraminal encroachment at the right C3-4 level, right C4-5 level, bilateral C5-6 level, and bilateral C6-7 levels. Other neck: 10 mm nodule within the left thyroid gland. Upper chest: Negative. Review of the MIP images confirms the above findings CTA HEAD FINDINGS Anterior circulation: No significant stenosis, proximal occlusion, aneurysm, or vascular malformation. Calcific atherosclerosis of the carotid siphons with mild bilateral paraclinoid ICA stenosis. Mild distal left M1 stenosis. Posterior circulation: No significant stenosis, proximal occlusion, aneurysm, or vascular malformation. Mild lower basilar stenosis. Venous sinuses: As permitted by contrast timing, patent. Anatomic variants: Patent anterior communicating artery and left posterior communicating arteries. No right posterior communicating artery identified, likely hypoplastic or absent. Delayed phase: No abnormal intracranial enhancement. Review of the MIP images confirms the above findings IMPRESSION: CTA neck:  1. No evidence of dissection, hemodynamically significant stenosis, or occlusion of the carotid and vertebral arteries of the neck. 2. Mild beaded irregularity of the cervical ICA bilaterally may represent fibromuscular dysplasia. 3. Moderate cervical spine spondylosis. CTA head: 1. No large vessel occlusion, aneurysm, or significant  stenosis of the Circle of Willis. 2. Intracranial atherosclerosis with mild bilateral paraclinoid ICA and mild distal left M1 stenosis. These results were called by telephone at the time of interpretation on 01/18/2019 at 7:03 pm to Dr. Rory Percy, who verbally acknowledged these results. Electronically Signed   By: Kristine Garbe M.D.   On: 01/18/2019 19:03   Ct Head Wo Contrast  Result Date: 01/19/2019 CLINICAL DATA:  Follow up stroke. History of hypertension, hyperlipidemia and diabetes. EXAM: CT HEAD WITHOUT CONTRAST TECHNIQUE: Contiguous axial images were obtained from the base of the skull through the vertex without intravenous contrast. COMPARISON:  CT HEAD January 18, 2019 and MRI head July 15, 2017 FINDINGS: BRAIN: No intraparenchymal hemorrhage, mass effect nor midline shift. No parenchymal brain volume loss for age. No hydrocephalus. Faint supratentorial white matter hypodensities less than expected for patient's age, though non-specific are most compatible with chronic small vessel ischemic disease. Age indeterminate RIGHT basal ganglia lacunar infarct. No acute large vascular territory infarcts. No abnormal extra-axial fluid collections. Basal cisterns are patent. VASCULAR: Moderate calcific atherosclerosis of the carotid siphons. SKULL: No skull fracture. No significant scalp soft tissue swelling. SINUSES/ORBITS: Trace paranasal sinus mucosal thickening. LEFT ethmoid mucosal retention cyst. Mastoid air cells are well aerated.The included ocular globes and orbital contents are non-suspicious. OTHER: None. IMPRESSION: 1. Age indeterminate RIGHT basal ganglia lacunar  infarct. 2. Otherwise negative non-contrast CT HEAD for age. Electronically Signed   By: Elon Alas M.D.   On: 01/19/2019 18:47   Ct Angio Neck W Or Wo Contrast  Result Date: 01/18/2019 CLINICAL DATA:  73 y/o  F; left-sided weakness. Code stroke. EXAM: CT ANGIOGRAPHY HEAD AND NECK TECHNIQUE: Multidetector CT imaging of the head and neck was performed using the standard protocol during bolus administration of intravenous contrast. Multiplanar CT image reconstructions and MIPs were obtained to evaluate the vascular anatomy. Carotid stenosis measurements (when applicable) are obtained utilizing NASCET criteria, using the distal internal carotid diameter as the denominator. CONTRAST:  83mL OMNIPAQUE IOHEXOL 350 MG/ML SOLN COMPARISON:  01/18/2019 CT head. FINDINGS: CTA NECK FINDINGS Aortic arch: Bovine variant branching. Imaged portion shows no evidence of aneurysm or dissection. No significant stenosis of the major arch vessel origins. Right carotid system: No evidence of dissection, stenosis (50% or greater) or occlusion. Mildly beaded non stenotic contour of the internal carotid arteries. Mild non stenotic calcific atherosclerosis of the carotid bifurcation. Left carotid system: No evidence of dissection, stenosis (50% or greater) or occlusion. Mildly beaded non stenotic contour of the internal carotid arteries. Mild non stenotic calcific atherosclerosis of the carotid bifurcation. Vertebral arteries: Left dominant. No evidence of dissection, stenosis (50% or greater) or occlusion. Skeleton: Moderate spondylosis of the cervical spine with multilevel disc and facet degenerative changes. No significant bony spinal canal stenosis. Uncovertebral and facet hypertrophy result in bony neural foraminal encroachment at the right C3-4 level, right C4-5 level, bilateral C5-6 level, and bilateral C6-7 levels. Other neck: 10 mm nodule within the left thyroid gland. Upper chest: Negative. Review of the MIP images confirms  the above findings CTA HEAD FINDINGS Anterior circulation: No significant stenosis, proximal occlusion, aneurysm, or vascular malformation. Calcific atherosclerosis of the carotid siphons with mild bilateral paraclinoid ICA stenosis. Mild distal left M1 stenosis. Posterior circulation: No significant stenosis, proximal occlusion, aneurysm, or vascular malformation. Mild lower basilar stenosis. Venous sinuses: As permitted by contrast timing, patent. Anatomic variants: Patent anterior communicating artery and left posterior communicating arteries. No right posterior communicating artery identified, likely hypoplastic or absent.  Delayed phase: No abnormal intracranial enhancement. Review of the MIP images confirms the above findings IMPRESSION: CTA neck: 1. No evidence of dissection, hemodynamically significant stenosis, or occlusion of the carotid and vertebral arteries of the neck. 2. Mild beaded irregularity of the cervical ICA bilaterally may represent fibromuscular dysplasia. 3. Moderate cervical spine spondylosis. CTA head: 1. No large vessel occlusion, aneurysm, or significant stenosis of the Circle of Willis. 2. Intracranial atherosclerosis with mild bilateral paraclinoid ICA and mild distal left M1 stenosis. These results were called by telephone at the time of interpretation on 01/18/2019 at 7:03 pm to Dr. Rory Percy, who verbally acknowledged these results. Electronically Signed   By: Kristine Garbe M.D.   On: 01/18/2019 19:03   Mr Brain Wo Contrast  Result Date: 01/21/2019 CLINICAL DATA:  Left-sided weakness and slurred speech EXAM: MRI HEAD WITHOUT CONTRAST TECHNIQUE: Multiplanar, multiecho pulse sequences of the brain and surrounding structures were obtained without intravenous contrast. COMPARISON:  Head CT 01/19/2019 FINDINGS: BRAIN: There is no acute infarct, acute hemorrhage, hydrocephalus or extra-axial collection. Unchanged appearance of old right basal ganglia small vessel infarct. No  midline shift or other mass effect. The white matter signal is normal for the patient's age. The cerebral and cerebellar volume are age-appropriate. Susceptibility-sensitive sequences show no chronic microhemorrhage or superficial siderosis. VASCULAR: Major intracranial arterial and venous sinus flow voids are normal. SKULL AND UPPER CERVICAL SPINE: Calvarial bone marrow signal is normal. There is no skull base mass. Visualized upper cervical spine and soft tissues are normal. SINUSES/ORBITS: No fluid levels or advanced mucosal thickening. No mastoid or middle ear effusion. The orbits are normal. IMPRESSION: Unchanged examination without acute abnormality. Old right basal ganglia small vessel infarct. Electronically Signed   By: Ulyses Jarred M.D.   On: 01/21/2019 01:35   Ct Head Code Stroke Wo Contrast  Result Date: 01/18/2019 CLINICAL DATA:  Code stroke.  73 y/o  F; left-sided weakness. EXAM: CT HEAD WITHOUT CONTRAST TECHNIQUE: Contiguous axial images were obtained from the base of the skull through the vertex without intravenous contrast. COMPARISON:  07/15/2017 MRI of the head. FINDINGS: Brain: No evidence of acute infarction, hemorrhage, hydrocephalus, extra-axial collection or mass lesion/mass effect. Small lucency within right medial lentiform nucleus corresponds to a prominent perivascular space on prior MRI of the head. Stable expanded partially empty sella turcica. Vascular: Calcific atherosclerosis of the carotid siphons and vertebral arteries. No hyperdense vessel identified. Skull: Normal. Negative for fracture or focal lesion. Sinuses/Orbits: Mild mucosal thickening of left maxillary sinus and left anterior ethmoid air cells. Normal aeration of mastoid air cells. Orbits are unremarkable. Other: None. ASPECTS [facility] Stroke Program Early CT Score) - Ganglionic level infarction (caudate, lentiform nuclei, internal capsule, insula, M1-M3 cortex): 7 - Supraganglionic infarction (M4-M6 cortex): 3 Total  score (0-10 with 10 being normal): 10 IMPRESSION: 1. No acute intracranial abnormality identified. Stable expanded partially empty sella turcica. Otherwise unremarkable CT of the head for age. 2. ASPECTS is 10 These results were called by telephone at the time of interpretation on 01/18/2019 at 6:34 pm to Dr. Rory Percy, who verbally acknowledged these results. Electronically Signed   By: Kristine Garbe M.D.   On: 01/18/2019 18:35   2D echocardiogram 01/19/2019  1. The left ventricle has normal systolic function with an ejection fraction of 60-65%. The cavity size was normal. There is severely increased left ventricular wall thickness. Left ventricular diastology could not be evaluated due to nondiagnostic  images.  2. The right ventricle has normal systolic function. The  cavity was normal. There is no increase in right ventricular wall thickness.  3. The mitral valve is normal in structure.  4. The tricuspid valve is normal in structure.  5. The aortic valve is tricuspid Mild sclerosis of the aortic valve.  6. The pulmonic valve was normal in structure.  7. The inferior vena cava was dilated in size with >50% respiratory variability.      HISTORY OF PRESENT ILLNESS Danitza Schoenfeldt Lynnis an 73 y.o.femaleWith PMH DM 2, HTN, HLD, CAD, obesity who presented to [facility] ED as a code stroke with c/o left side weakness and slurred speech. No prior stroke history. according to EMS at 1720 patient was with her family when she had a sudden onset of slurred speech and left side weakness.Family also reports some left facial droop. EMS was called, blood pressure systolic in the 226J, CBG within normal ranges-not hypoglycemic.  Last known well 01/18/2019 at 1730. Endorses taking ASA and Plavix. Did have a fall last week,no bleedings, no headaches after that.. Denies any CP, SOB, vision changes.  CT negative for hemorrhage.  Systolic blood pressure 335.  Treated with IV TPA at [facility] with labetalol on board for blood  pressure control, transition to Cleviprex drip. Modified Rankin:Rankin Score=0.  NIHSS:5.  She was admitted to the neuro ICU.  HOSPITAL COURSE Ms.Mykael STEPANIE GRAVER a 49 y.o.femalewith history of HTN, HLD, DB, CAD, obesity presenting with L sided weakness and slurred speech. Received IV tPA 01/18/2019 at [facility].patient did well off the TPN showed improvement. Blood pressure tightly controlled in the ICU. MRI showed no acute abnormality but an old basal ganglia  stroke. She was seen by physical occupational speech therapy and felt stable enough to discharge home with home therapy.echocardiogram was unremarkable an LDL cholesterol and hemoglobin A1c evaluated. She was started on antiplatelet therapy and advised aggressive risk factor modification.  Stroke-like symptoms:  R brain s/p IV tPA  Code Stroke CT head No acute stroke. Small vessel disease. Atrophy. ASPECTS 10.   CTA head no LVO. atherosclerosis w/ B paraclinoid ICA and L M1 mild stenosis  CTA neck no LVO. Mild B ICA beading. Mod cervical spndylosis  MRI unchanged. Old R BG infarct  2D Echo EF 60-65%. No source of embolus   LDL139  HgbA1c7.7  aspirin 81 mg daily and clopidogrel 75 mg dailyprior to admission, resume both agents as imaging negative for hemorrhage.  Continue at discharge.  Therapy recommendations:  HH PT and HH OT  Disposition:  Return home  Hypertension  Required cleviprex treatment pre-tPA  On multiple home BP meds  Resumed home meds  Blood pressure stable   BP goal normotensive  Hyperlipidemia  Home meds: lipitor 80,resumed in hospital  LDL 139, goal < 70  May need to consider alternative treatments as an OP   Continue statin at discharge  Diabetes type II  HgbA1c 7.7, goal < 7.0  Uncontrolled  Resume home meds at discharge  Other Stroke Risk Factors  Advanced age  Morbid Obesity,Body mass index is 49.71 kg/m., recommend weight loss, diet and exercise as appropriate    Family hx stroke (father)  Coronary artery disease PCI, DES  Likely OSA.  Considered for sleep smart study but did not qualify.  Will need outpatient sleep follow-up  Other Active Problems  Possible AKI on CKD Cr 1.8. -> 1.61   DISCHARGE EXAM Blood pressure (!) 155/92, pulse 67, temperature (!) 97.5 F (36.4 C), temperature source Oral, resp. rate 18, height 5\' 5"  (  1.651 m), weight 135.5 kg, SpO2 98 %. pleasant obese elderly African American lady not in distress. . Afebrile. Head is nontraumatic. Neck is supple without bruit.    Cardiac exam no murmur or gallop. Lungs are clear to auscultation. Distal pulses are well felt. Neurological Exam :  Awake alert oriented to time place and person. No dysarthria or aphasia or apraxia. Extraocular moments are full range without nystagmus. Blinks to threat bilaterally. Fundi not visualized. Face is symmetric without weakness. Tongue midline. Motor system exam reveals mild left lower extremity drift. Good proximal strength bilaterally. Mild weakness of left grip and intrinsic hand muscles and left ankle dorsiflexors and hip flexors only. Sensation appears preserved bilaterally. Deep tendon reflexes are symmetric. Plantars are downgoing. Gait not tested.   Discharge Diet   heart healthy/carb modified thin liquids  DISCHARGE PLAN  Disposition: home  With home health PT; SLP  Outpatient sleep evaluation to be arranged at time of neurologic follow-up   aspirin 81 mg daily and clopidogrel 75 mg daily for secondary stroke prevention.  Ongoing risk factor control by Primary Care Physician at time of discharge  Follow-up Jearld Fenton, NP in 2 weeks.  Follow-up in [facility] in 4 weeks, office to schedule an appointment.   33 minutes were spent preparing discharge.  Laurey Morale, MSN, NP-C [facility] [phone]  I have personally obtained history,examined this patient, reviewed notes,  independently viewed imaging studies, participated in medical decision making and plan of care.ROS completed by me personally and pertinent positives fully documented  I have made any additions or clarifications directly to the above note. Agree with note above.    Antony Contras, MD Medical Director [facility] Pager: [phone] 01/22/2019 3:41 PM

## 2019-01-22 NOTE — Progress Notes (Signed)
Pt d/c home alert and oriented with no new concerns. D/c instructions done with teach back. Pt verbalize understanding. Pt will be transported out of the facility by family

## 2019-01-22 NOTE — Plan of Care (Signed)
Pt met and completed care plan goals for this admission

## 2019-01-22 NOTE — Progress Notes (Signed)
Occupational Therapy Treatment Patient Details Name: Alicia [name] MRN: [id number] DOB: 26-Dec-1945 Today's Date: 01/22/2019    History of present illness Alicia [name] is an 73 y.o. female  With PMH DM 2, HTN, HLD, CAD, obesity who presented to [facility] ED as a code stroke with c/o left side weakness and slurred speech. pt given tPA at 18:39 3/1. Initial CT  (01/18/2019) negative. CTA head and neck reveal no emergent LVO. CT on 01/19/2019 Age indeterminate RIGHT basal ganglia lacunar infarct. MRI pending.   OT comments  Pt progressing towards goals. Able to complete dressing, toileting, peri care and UB bathing with supervision during session. Reports LUE sensation continues to improve. Reviewed BE FAST signs of stroke. Pt and sister verbalized understanding, POC remains appropriate.    Follow Up Recommendations  Home health OT;Supervision/Assistance - 24 hour    Equipment Recommendations  None recommended by OT    Recommendations for Other Services      Precautions / Restrictions Precautions Precautions: Fall Precaution Comments: dizziness with turns Restrictions Weight Bearing Restrictions: No       Mobility Bed Mobility Overal bed mobility: Modified Independent                Transfers Overall transfer level: Modified independent                    Balance Overall balance assessment: Needs assistance Sitting-balance support: No upper extremity supported;Feet supported Sitting balance-Leahy Scale: Good     Standing balance support: No upper extremity supported Standing balance-Leahy Scale: Fair                             ADL either performed or assessed with clinical judgement   ADL Overall ADL's : Needs assistance/impaired         Upper Body Bathing: Supervision/ safety;Sitting       Upper Body Dressing : Supervision/safety;Sitting Upper Body Dressing Details (indicate cue type and reason): to don bra, and shirt Lower Body Dressing:  Supervision/safety;Sit to/from stand Lower Body Dressing Details (indicate cue type and reason): underwear, pants Toilet Transfer: Supervision/safety;Ambulation;RW;Cueing for safety Toilet Transfer Details (indicate cue type and reason): "I'm not going to use that at home (RW)" Toileting- Water quality scientist and Hygiene: Supervision/safety;Sit to/from stand       Functional mobility during ADLs: Supervision/safety;Rolling walker;Cueing for safety General ADL Comments: SOB with dressing tasks     Vision       Perception     Praxis      Cognition Arousal/Alertness: Awake/alert Behavior During Therapy: WFL for tasks assessed/performed Overall Cognitive Status: Within Functional Limits for tasks assessed                                 General Comments: Seems WFL for basic mobility tasks except reports some word finding difficulties at times; for ex. a friend from church was in her room initially and took her a second to state her name.         Exercises     Shoulder Instructions       General Comments sister present throughout session    Pertinent Vitals/ Pain       Pain Assessment: No/denies pain  Home Living  Prior Functioning/Environment              Frequency  Min 2X/week        Progress Toward Goals  OT Goals(current goals can now be found in the care plan section)  Progress towards OT goals: Progressing toward goals  Acute Rehab OT Goals Patient Stated Goal: want to be independent OT Goal Formulation: With patient Time For Goal Achievement: 02/02/19  Plan Discharge plan remains appropriate    Co-evaluation                 AM-PAC OT "6 Clicks" Daily Activity     Outcome Measure   Help from another person eating meals?: None Help from another person taking care of personal grooming?: A Little Help from another person toileting, which includes using toliet,  bedpan, or urinal?: A Little Help from another person bathing (including washing, rinsing, drying)?: A Little Help from another person to put on and taking off regular upper body clothing?: A Little Help from another person to put on and taking off regular lower body clothing?: A Little 6 Click Score: 19    End of Session Equipment Utilized During Treatment: Rolling walker  OT Visit Diagnosis: Unsteadiness on feet (R26.81);Muscle weakness (generalized) (M62.81)   Activity Tolerance Patient tolerated treatment well   Patient Left in chair;with call bell/phone within reach;with family/visitor present;Other (comment)(PT coming in for session)   Nurse Communication Mobility status        Time: 7989-2119 OT Time Calculation (min): 25 min  Charges: OT General Charges $OT Visit: 1 Visit OT Treatments $Self Care/Home Management : 23-37 mins  Hulda Humphrey OTR/L Acute Rehabilitation Services Pager: [phone] Office: [facility] 01/22/2019, 1:29 PM

## 2019-01-23 ENCOUNTER — Telehealth: Payer: Self-pay

## 2019-01-23 NOTE — Telephone Encounter (Signed)
[name] PT with [facility] HH left v/m requesting verbal orders for [facility] PT 1x a wk for 1 wk ; 2 x a wk for 3 wks and 1 x a wk for 1 wk.[name] also needs to know who Avie Echevaria NP supervising physician is.Please advise.

## 2019-01-23 NOTE — Telephone Encounter (Signed)
Called patient's home phone number to complete TCM call and schedule an appointment. Voicemail was full and I could not leave a message. Called patient's son, Sanyah Molnar, left a message for him to have patient call us back when able.

## 2019-01-26 NOTE — Telephone Encounter (Signed)
Ok for home health PT as requested

## 2019-01-26 NOTE — Telephone Encounter (Signed)
VO given as instructed  

## 2019-01-28 ENCOUNTER — Telehealth: Payer: Self-pay | Admitting: Internal Medicine

## 2019-01-28 ENCOUNTER — Inpatient Hospital Stay: Payer: Medicare Other | Admitting: Internal Medicine

## 2019-01-28 NOTE — Telephone Encounter (Signed)
Will see at appt tomorrow. If having any chest pain, chest tightness or s/s of stroke before then, call EMS

## 2019-01-28 NOTE — Telephone Encounter (Signed)
Estill Bamberg [phone] with [facility] OT called regarding pt's BP. Ist reading was 180/124, second reading 10 min later was 164/115 and stayed there. Pt had stroke and is set up for HFU tomorrow, 3/12. Can call pt, or Estill Bamberg- she has left the home.

## 2019-01-28 NOTE — Telephone Encounter (Signed)
Pt is aware as instructed and expressed understanding 

## 2019-01-29 ENCOUNTER — Ambulatory Visit (INDEPENDENT_AMBULATORY_CARE_PROVIDER_SITE_OTHER): Payer: Medicare Other | Admitting: Internal Medicine

## 2019-01-29 ENCOUNTER — Other Ambulatory Visit: Payer: Self-pay

## 2019-01-29 ENCOUNTER — Encounter: Payer: Self-pay | Admitting: Internal Medicine

## 2019-01-29 VITALS — BP 162/98 | HR 64 | Temp 97.8°F | Wt 299.0 lb

## 2019-01-29 DIAGNOSIS — I10 Essential (primary) hypertension: Secondary | ICD-10-CM

## 2019-01-29 DIAGNOSIS — I25118 Atherosclerotic heart disease of native coronary artery with other forms of angina pectoris: Secondary | ICD-10-CM | POA: Diagnosis not present

## 2019-01-29 DIAGNOSIS — I69354 Hemiplegia and hemiparesis following cerebral infarction affecting left non-dominant side: Secondary | ICD-10-CM | POA: Diagnosis not present

## 2019-01-29 DIAGNOSIS — E119 Type 2 diabetes mellitus without complications: Secondary | ICD-10-CM

## 2019-01-29 DIAGNOSIS — I639 Cerebral infarction, unspecified: Secondary | ICD-10-CM

## 2019-01-29 DIAGNOSIS — E7800 Pure hypercholesterolemia, unspecified: Secondary | ICD-10-CM

## 2019-01-29 MED ORDER — CARVEDILOL 25 MG PO TABS: 50.0000 mg | ORAL_TABLET | Freq: Two times a day (BID) | ORAL | 3 refills | Status: DC

## 2019-01-29 NOTE — Patient Instructions (Signed)

## 2019-01-29 NOTE — Progress Notes (Signed)
Subjective:    Patient ID: Alicia [name], female    DOB: [birth year], 73 y.o.   MRN: [id number]  HPI  Pt presents to the clinic today for [facility] Follow Up. She went to the ER 3/1 with c/o left sided weakness and slurred speech. CT head showed:  IMPRESSION: 1. No acute intracranial abnormality identified. Stable expanded partially empty sella turcica. Otherwise unremarkable CT of the head for age. 2. ASPECTS is 10  CTA showed:  IMPRESSION: CTA neck:  1. No evidence of dissection, hemodynamically significant stenosis, or occlusion of the carotid and vertebral arteries of the neck. 2. Mild beaded irregularity of the cervical ICA bilaterally may represent fibromuscular dysplasia. 3. Moderate cervical spine spondylosis.  CTA head:  1. No large vessel occlusion, aneurysm, or significant stenosis of the Circle of Willis. 2. Intracranial atherosclerosis with mild bilateral paraclinoid ICA and mild distal left M1 stenosis.  Repeat CT head showed:  IMPRESSION: 1. Age indeterminate RIGHT basal ganglia lacunar infarct. 2. Otherwise negative non-contrast CT HEAD for age.  MRI brain showed:  IMPRESSION: Unchanged examination without acute abnormality. Old right basal ganglia small vessel infarct.  Echocardiogram was normal. They put her on TPA. Her BP is managed on Carvedilol, Losartan and Spironolactone. Her BP today is 162/98. Her last A1C was 7.7%. She is taking Glipizide and Tradjenta. She is also on Atorvastatin, Isosorbide, ASA and Plavix. She was discharged 3/5, with home health PT. She reports her speech is no longer slurred but she still has left sided weakness. She has a follow up appt with neurology next week.  Review of Systems      Past Medical History:  Diagnosis Date  . Anemia   . Arthritis    "right knee" (10/21/2015)  . CAD (coronary artery disease)    a. Cath 10/21/15: s/p DES to RCA: moderate diffuse stenosis of mod diagonal, diffuse irregularity LCx and  LAD. 2D echo 10/21/15: mod LVH, EF 60-65%, no RWMA, calcified MV, mod-severe LAE. 03/18/17 PCI with DES--> 1 diag  . Cataract   . Essential hypertension   . Family history of adverse reaction to anesthesia    "oldest sister, [name], related to brain formation at back of head; when they put her to sleep it's hard to wake her up"  . GERD (gastroesophageal reflux disease)   . History of blood transfusion    "related to ruptured tubal pregnancy"  . Hyperlipidemia   . Hypertensive heart disease   . Morbid obesity ([place])   . Type II diabetes mellitus ([place])     Current Outpatient Medications  Medication Sig Dispense Refill  . aspirin 81 MG tablet Take 81 mg by mouth daily.    [name] atorvastatin (LIPITOR) 80 MG tablet Take 1 tablet (80 mg total) by mouth daily at 6 PM. 90 tablet 3  . carvedilol (COREG) 25 MG tablet Take 1 tablet (25 mg total) by mouth 2 (two) times daily. 180 tablet 3  . clopidogrel (PLAVIX) 75 MG tablet Take 1 tablet (75 mg total) by mouth daily. 30 tablet 0  . glipiZIDE (GLUCOTROL) 10 MG tablet Take 1 tablet (10 mg total) by mouth 2 (two) times daily before a meal. 60 tablet 2  . isosorbide mononitrate (IMDUR) 30 MG 24 hr tablet Take 1 tablet (30 mg total) by mouth daily. 30 tablet 1  . linagliptin (TRADJENTA) 5 MG TABS tablet Take 1 tablet (5 mg total) by mouth daily. 30 tablet 2  . losartan (COZAAR) 100 MG tablet  Take 1 tablet (100 mg total) by mouth daily. 90 tablet 3  . spironolactone (ALDACTONE) 25 MG tablet Take 1 tablet (25 mg total) by mouth daily. 30 tablet 2   No current facility-administered medications for this visit.     Allergies  Allergen Reactions  . Iodinated Diagnostic Agents Hives  . Sulfa Antibiotics Hives    Family History  Problem Relation Age of Onset  . Diabetes Mother   . Heart attack Mother        2007  . Hypertension Mother   . Hyperlipidemia Mother   . Heart disease Mother   . Heart attack Father   . Stroke Father   . Hypertension Father    . Hyperlipidemia Father   . Stomach cancer Sister   . Diabetes Sister   . Diabetes Sister   . Diabetes Sister     Social History   Socioeconomic History  . Marital status: Divorced    Spouse name: Not on file  . Number of children: 3  . Years of education: doctorate  . Highest education level: Not on file  Occupational History  . Occupation: Pharmacist, hospital  Social Needs  . Financial resource strain: Not on file  . Food insecurity:    Worry: Not on file    Inability: Not on file  . Transportation needs:    Medical: Not on file    Non-medical: Not on file  Tobacco Use  . Smoking status: Never Smoker  . Smokeless tobacco: Never Used  Substance and Sexual Activity  . Alcohol use: No  . Drug use: No  . Sexual activity: Not Currently  Lifestyle  . Physical activity:    Days per week: Not on file    Minutes per session: Not on file  . Stress: Not on file  Relationships  . Social connections:    Talks on phone: Not on file    Gets together: Not on file    Attends religious service: Not on file    Active member of club or organization: Not on file    Attends meetings of clubs or organizations: Not on file    Relationship status: Not on file  . Intimate partner violence:    Fear of current or ex partner: Not on file    Emotionally abused: Not on file    Physically abused: Not on file    Forced sexual activity: Not on file  Other Topics Concern  . Not on file  Social History Narrative   Caffeine use-yes   Regular exercise-no     Constitutional: Denies fever, malaise, fatigue, headache or abrupt weight changes.  Respiratory: Denies difficulty breathing, shortness of breath, cough or sputum production.   Cardiovascular: Denies chest pain, chest tightness, palpitations or swelling in the hands or feet.  Musculoskeletal: Pt reports left sided weakness. Denies decrease in range of motion, difficulty with gait, or joint pain and swelling.  Skin: Denies redness, rashes, lesions  or ulcercations.  Neurological: Denies dizziness, difficulty with memory, difficulty with speech or problems with balance and coordination.    No other specific complaints in a complete review of systems (except as listed in HPI above).  Objective:   Physical Exam   BP (!) 162/98   Pulse 64   Temp 97.8 F (36.6 C) (Oral)   Wt 299 lb (135.6 kg)   SpO2 98%   BMI 49.76 kg/m  Wt Readings from Last 3 Encounters:  01/29/19 299 lb (135.6 kg)  01/18/19 298 lb 11.6  oz (135.5 kg)  09/16/18 298 lb 3.2 oz (135.3 kg)    General: Appears her stated age, obese, in NAD. HEENT: Head: normal shape and size; Eyes: sclera white, no icterus, conjunctiva pink, PERRLA and EOMs intact;  Cardiovascular: Normal rate and rhythm. S1,S2 noted.  No murmur, rubs or gallops noted. Trace BLE edema. Pulmonary/Chest: Normal effort and positive vesicular breath sounds. No respiratory distress. No wheezes, rales or ronchi noted.  Musculoskeletal: Strength 5/5 RUE/RLE. Strength 4/5 LUE/LLE. Neurological: Alert and oriented.  Coordination normal.    BMET    Component Value Date/Time   NA 139 01/20/2019 0529   NA 142 04/08/2017 1353   K 4.0 01/20/2019 0529   CL 107 01/20/2019 0529   CO2 25 01/20/2019 0529   GLUCOSE 150 (H) 01/20/2019 0529   BUN 20 01/20/2019 0529   BUN 19 04/08/2017 1353   CREATININE 1.61 (H) 01/20/2019 0529   CREATININE 1.55 (H) 08/10/2016 0850   CALCIUM 8.1 (L) 01/20/2019 0529   GFRNONAA 32 (L) 01/20/2019 0529   GFRAA 37 (L) 01/20/2019 0529    Lipid Panel     Component Value Date/Time   CHOL 199 01/19/2019 0347   TRIG 49 01/19/2019 0347   HDL 50 01/19/2019 0347   CHOLHDL 4.0 01/19/2019 0347   VLDL 10 01/19/2019 0347   LDLCALC 139 (H) 01/19/2019 0347    CBC    Component Value Date/Time   WBC 7.9 01/20/2019 0529   RBC 4.30 01/20/2019 0529   HGB 9.4 (L) 01/20/2019 0529   HGB 10.6 (L) 03/15/2017 1341   HCT 31.6 (L) 01/20/2019 0529   HCT 34.7 03/15/2017 1341   PLT 219  01/20/2019 0529   PLT 208 03/15/2017 1341   MCV 73.5 (L) 01/20/2019 0529   MCV 72 (L) 03/15/2017 1341   MCH 21.9 (L) 01/20/2019 0529   MCHC 29.7 (L) 01/20/2019 0529   RDW 16.5 (H) 01/20/2019 0529   RDW 16.2 (H) 03/15/2017 1341   LYMPHSABS 2.1 01/18/2019 1820   MONOABS 0.5 01/18/2019 1820   EOSABS 0.1 01/18/2019 1820   BASOSABS 0.0 01/18/2019 1820    Hgb A1C Lab Results  Component Value Date   HGBA1C 7.7 (H) 01/19/2019           Assessment & Plan:   [facility] Follow UP for Stroke, HTN, HLD, CAD and DM 2:  Hospital notes, labs and imaging reviewed She will continue home health PT/OT Increase Carvedilol to 50 mg BID Continue other meds as prescribed Encouraged her to consume a low salt, low fat, low carb diet and exercise for weight loss Follow up with neurology as prescribed  RTC in 3 months for your Medicare Wellness Exam Webb Silversmith, NP

## 2019-02-18 ENCOUNTER — Other Ambulatory Visit: Payer: Self-pay | Admitting: Internal Medicine

## 2019-02-19 ENCOUNTER — Telehealth: Payer: Self-pay | Admitting: Internal Medicine

## 2019-02-19 NOTE — Telephone Encounter (Signed)
Set up Doxy.Me to discuss

## 2019-02-19 NOTE — Telephone Encounter (Signed)
Alicia [name], called.  She said patient's bp has been running 152/98-172/102. The diastolic doesn't come down. [name] wants to know if patient could start Hydralazine 10 mg every 12 hrs or Clonidine 0.1 TID. Patient doesn't have edema or fluid.  [facility] if the medication is added, she may need skilled nursing 1 x wk for 4 wks to monitor her bp. Patient has lost weight.

## 2019-02-19 NOTE — Telephone Encounter (Signed)
Called pt, no answer and no vm will try again later to see if pt can set up a DOXY.ME video visit

## 2019-02-23 NOTE — Telephone Encounter (Signed)
appt scheduled for tomorrow but has to do on computer, pt said she believes she has a camera on new computer and will have sister check BP tonight  Send email to [email or web address]

## 2019-02-24 ENCOUNTER — Ambulatory Visit (INDEPENDENT_AMBULATORY_CARE_PROVIDER_SITE_OTHER): Payer: Medicare Other | Admitting: Internal Medicine

## 2019-02-24 ENCOUNTER — Encounter: Payer: Self-pay | Admitting: Internal Medicine

## 2019-02-24 ENCOUNTER — Other Ambulatory Visit: Payer: Self-pay

## 2019-02-24 DIAGNOSIS — I10 Essential (primary) hypertension: Secondary | ICD-10-CM

## 2019-02-24 DIAGNOSIS — Z8673 Personal history of transient ischemic attack (TIA), and cerebral infarction without residual deficits: Secondary | ICD-10-CM

## 2019-02-24 MED ORDER — AMLODIPINE BESYLATE 10 MG PO TABS: 10.0000 mg | ORAL_TABLET | Freq: Every day | ORAL | 2 refills | Status: DC

## 2019-02-24 NOTE — Progress Notes (Signed)
Virtual Visit via Video Note  I connected with Alicia [name] on 02/24/19 at 11:30 AM EDT by a video enabled telemedicine application and verified that I am speaking with the correct person using two identifiers.   I discussed the limitations of evaluation and management by telemedicine and the availability of in person appointments. The patient expressed understanding and agreed to proceed.  History of Present Illness:  Pt reports elevated blood pressures. They have been running 152/98-172/102 at home. She is currently taking Losartan 100 mg, Carvedilol 25 mg and Spironolactone 25 mg. She denies headaches, dizziness, chest pain or shortness of breath. She has recently lost some weight . She reports mild swelling in her legs. She recently had a stroke 01/2019.   Observations/Objective:  Alert and oriented x 3 NAD No obvious SOB   Assessment and Plan:  HTN, s/pTIA:  Stressed the importance of good BP control Continue Losartan, Carvedilol and Spironolactone Will add Amlodipine 10 mg daily Reinforced DASH diet and exercise for weight loss Advised her to have home health nurse to update me weekly with blood pressures.  Follow Up Instructions:    I discussed the assessment and treatment plan with the patient. The patient was provided an opportunity to ask questions and all were answered. The patient agreed with the plan and demonstrated an understanding of the instructions.   The patient was advised to call back or seek an in-person evaluation if the symptoms worsen or if the condition fails to improve as anticipated.     Webb Silversmith, NP

## 2019-02-24 NOTE — Patient Instructions (Signed)

## 2019-03-17 ENCOUNTER — Other Ambulatory Visit: Payer: Self-pay | Admitting: Physician Assistant

## 2019-03-23 ENCOUNTER — Telehealth: Payer: Self-pay | Admitting: *Deleted

## 2019-03-23 NOTE — Telephone Encounter (Signed)
Attempted call to patient to complete [place] 2 year telephone follow up. Voice mail was full, unable to leave message.  Emailed patient with instructions to call [facility] office or my cell.

## 2019-04-16 ENCOUNTER — Encounter: Payer: Self-pay | Admitting: *Deleted

## 2019-04-16 DIAGNOSIS — Z006 Encounter for examination for normal comparison and control in clinical research program: Secondary | ICD-10-CM

## 2019-04-24 ENCOUNTER — Other Ambulatory Visit: Payer: Self-pay

## 2019-04-24 NOTE — Patient Outreach (Signed)
Telephone outreach to patient to obtain mRs was successfully completed. mRs= 2. 

## 2019-05-01 ENCOUNTER — Other Ambulatory Visit: Payer: Self-pay | Admitting: Internal Medicine

## 2019-05-05 ENCOUNTER — Encounter: Payer: Medicare Other | Admitting: Internal Medicine

## 2019-05-16 ENCOUNTER — Other Ambulatory Visit: Payer: Self-pay | Admitting: Internal Medicine

## 2019-05-18 NOTE — [facility] (Signed)
[facility] study 2 year telephone follow up completed. Pt reports elevated blood pressures. They have been running 152/98-172/102 at home Amlodipine added back in April. Patient had Stroke in march (already reported). No other Adverse events identified. Next research required visit will be between 30-mar-21 & 29-may-21.

## 2019-05-24 ENCOUNTER — Other Ambulatory Visit: Payer: Self-pay | Admitting: Internal Medicine

## 2019-06-07 ENCOUNTER — Other Ambulatory Visit: Payer: Self-pay | Admitting: Internal Medicine

## 2019-06-16 ENCOUNTER — Other Ambulatory Visit: Payer: Self-pay | Admitting: Internal Medicine

## 2019-07-05 ENCOUNTER — Other Ambulatory Visit: Payer: Self-pay | Admitting: Internal Medicine

## 2019-07-11 ENCOUNTER — Other Ambulatory Visit: Payer: Self-pay | Admitting: Internal Medicine

## 2019-07-14 ENCOUNTER — Other Ambulatory Visit: Payer: Self-pay | Admitting: Physician Assistant

## 2019-07-16 ENCOUNTER — Other Ambulatory Visit: Payer: Self-pay | Admitting: Internal Medicine

## 2019-07-16 DIAGNOSIS — Z1231 Encounter for screening mammogram for malignant neoplasm of breast: Secondary | ICD-10-CM

## 2019-07-20 ENCOUNTER — Other Ambulatory Visit: Payer: Self-pay

## 2019-07-20 ENCOUNTER — Ambulatory Visit
Admission: RE | Admit: 2019-07-20 | Discharge: 2019-07-20 | Disposition: A | Discharge status: Home / Self Care | Payer: Medicare Other | Source: Ambulatory Visit | Attending: Internal Medicine | Admitting: Internal Medicine

## 2019-07-20 DIAGNOSIS — Z1231 Encounter for screening mammogram for malignant neoplasm of breast: Secondary | ICD-10-CM

## 2019-07-22 ENCOUNTER — Other Ambulatory Visit: Payer: Self-pay | Admitting: Internal Medicine

## 2019-07-22 DIAGNOSIS — R928 Other abnormal and inconclusive findings on diagnostic imaging of breast: Secondary | ICD-10-CM

## 2019-08-04 ENCOUNTER — Other Ambulatory Visit: Payer: Self-pay | Admitting: Internal Medicine

## 2019-08-07 ENCOUNTER — Other Ambulatory Visit: Payer: Self-pay | Admitting: Physician Assistant

## 2019-08-10 ENCOUNTER — Other Ambulatory Visit: Payer: Self-pay | Admitting: Internal Medicine

## 2019-08-10 ENCOUNTER — Ambulatory Visit
Admission: RE | Admit: 2019-08-10 | Discharge: 2019-08-10 | Disposition: A | Discharge status: Home / Self Care | Payer: Medicare Other | Source: Ambulatory Visit | Attending: Internal Medicine | Admitting: Internal Medicine

## 2019-08-10 ENCOUNTER — Other Ambulatory Visit: Payer: Self-pay

## 2019-08-10 DIAGNOSIS — N6489 Other specified disorders of breast: Secondary | ICD-10-CM

## 2019-08-10 DIAGNOSIS — R928 Other abnormal and inconclusive findings on diagnostic imaging of breast: Secondary | ICD-10-CM

## 2019-08-11 ENCOUNTER — Other Ambulatory Visit: Payer: Self-pay | Admitting: Internal Medicine

## 2019-08-15 ENCOUNTER — Other Ambulatory Visit: Payer: Self-pay | Admitting: Internal Medicine

## 2019-08-28 ENCOUNTER — Telehealth: Payer: Self-pay | Admitting: Physician Assistant

## 2019-08-28 NOTE — Telephone Encounter (Signed)
New Message   Patient sent in a mychart message to see Ermalinda Barrios. She indicated that she was having mild chest pains and she wanted an appointment. I did make her an appointment for 10/14 with Ermalinda Barrios as well. Please call.

## 2019-08-28 NOTE — Telephone Encounter (Signed)
I spoke to the patient who has been experiencing mild CP and pain in her upper back.  She denies it radiating and is not SOB. She has an appointment on 10/14 with Ermalinda Barrios on 10/14.  I told her that if the pain worsens or she develops SOB, she should head to the ED.  She verbalized understanding.

## 2019-09-01 NOTE — H&P (View-Only) (Signed)
Cardiology Office Note    Date:  09/02/2019   ID:  Alicia, [name] Jul 07, 1946, MRN [id number]  PCP:  Alicia Fenton, NP  Cardiologist: Alicia Carnes, MD EPS: None  Chief Complaint  Patient presents with  . Chest Pain    History of Present Illness:  Alicia [name] is a 73 y.o. female  with history of CAD status post DES to the RCA 10/2015, repeat cath 03/18/2017 for chest pain DES to diagonal 1, 45% mid LAD treated with POBA, prior RCA stent was patent normal LVEF.  Also has hypertension, HLD and DM.  History of syncope 06/2017 CT MRI and carotid Dopplers were all normal.  Echo normal LVEF 65 to 70%.  30-day monitor showed normal sinus rhythm no arrhythmias detected.  Patient felt she was dehydrated.   I saw the patient 08/2018 and had lost 40 lbs on began diet.  Patient suffered a right brain CVA treated TPA 01/2019. Echo normal LVEF no source embolus. LDL 139 A1C 7.7. stayed on plavix and ASA.  Patient added onto my schedule for mild chest pain. Last Friday after making the bed and cleaning the bathroom she had a squeezing chest pain into left arm-tingling. Went away when she rested but when she got up it came back. [name] Has had everyday with activity. Was having some angina prior to that but thought it was stress-lost 2 friends from covid19.     Past Medical History:  Diagnosis Date  . Anemia   . Arthritis    "right knee" (10/21/2015)  . CAD (coronary artery disease)    a. Cath 10/21/15: s/p DES to RCA: moderate diffuse stenosis of mod diagonal, diffuse irregularity LCx and LAD. 2D echo 10/21/15: mod LVH, EF 60-65%, no RWMA, calcified MV, mod-severe LAE. 03/18/17 PCI with DES--> 1 diag  . Cataract   . Essential hypertension   . Family history of adverse reaction to anesthesia    "oldest sister, Alicia [name], related to brain formation at back of head; when they put her to sleep it's hard to wake her up"  . GERD (gastroesophageal reflux disease)   . History of blood transfusion    "related to ruptured tubal pregnancy"  . Hyperlipidemia   . Hypertensive heart disease   . Morbid obesity ([place])   . Type II diabetes mellitus ([place])     Past Surgical History:  Procedure Laterality Date  . ABDOMINAL HYSTERECTOMY  1988  . APPENDECTOMY  1985  . [place] SURGERY  1984  . CARDIAC CATHETERIZATION N/A 10/21/2015   Procedure: Right/Left Heart Cath and Coronary Angiography;  Surgeon: Alicia Mocha, MD;  Location: [facility];  Service: Cardiovascular;  Laterality: N/A;  . CHOLECYSTECTOMY OPEN  1984  . CORONARY STENT INTERVENTION N/A 03/18/2017   Procedure: Coronary Stent Intervention;  Surgeon: Alicia Man, MD;  Location: [facility];  Service: Cardiovascular;  Laterality: N/A;  . CORONARY STENT PLACEMENT  03/18/2017   A OPTIMIZE STUDY Drug Eluting Stent (2.5 mm 18 mm - post-dilated to 2.7 mm) was successfully placed  . ECTOPIC PREGNANCY SURGERY    . LEFT HEART CATH AND CORONARY ANGIOGRAPHY N/A 03/18/2017   Procedure: Left Heart Cath and Coronary Angiography;  Surgeon: Alicia Man, MD;  Location: [facility];  Service: Cardiovascular;  Laterality: N/A;  . REDUCTION MAMMAPLASTY Bilateral ~ 1976  . TONSILLECTOMY      Current Medications: Current Meds  Medication Sig  . amLODipine (NORVASC) 10 MG tablet Take 1  tablet (10 mg total) by mouth daily.  [name] aspirin 81 MG tablet Take 81 mg by mouth daily.  [name] atorvastatin (LIPITOR) 80 MG tablet Take 1 tablet (80 mg total) by mouth daily at 6 PM.  . carvedilol (COREG) 25 MG tablet Take 1 tablet (25 mg total) by mouth 2 (two) times daily.  . clopidogrel (PLAVIX) 75 MG tablet Take 1 tablet (75 mg total) by mouth daily.  . ferrous sulfate 325 (65 FE) MG tablet TAKE 1 TABLET BY MOUTH EVERY DAY  . glipiZIDE (GLUCOTROL) 10 MG tablet Take 1 tablet (10 mg total) by mouth 2 (two) times daily before a meal.  . isosorbide mononitrate (IMDUR) 60 MG 24 hr tablet Take 1 tablet (60 mg total) by mouth daily.  [name] linagliptin  (TRADJENTA) 5 MG TABS tablet Take 1 tablet (5 mg total) by mouth daily. MUST SCHEDULE PHYSICAL  . losartan (COZAAR) 100 MG tablet Take 1 tablet (100 mg total) by mouth daily.  . nitroGLYCERIN (NITROSTAT) 0.4 MG SL tablet Place 1 tablet (0.4 mg total) under the tongue every 5 (five) minutes as needed for chest pain.  [name] spironolactone (ALDACTONE) 25 MG tablet Take 1 tablet (25 mg total) by mouth daily.  . [DISCONTINUED] amLODipine (NORVASC) 10 MG tablet TAKE 1 TABLET (10 MG TOTAL) BY MOUTH DAILY. MUST SCHEDULE PHYSICAL EXAM  . [DISCONTINUED] atorvastatin (LIPITOR) 80 MG tablet Take 1 tablet (80 mg total) by mouth daily at 6 PM.  . [DISCONTINUED] carvedilol (COREG) 25 MG tablet Take 2 tablets (50 mg total) by mouth 2 (two) times daily.  . [DISCONTINUED] clopidogrel (PLAVIX) 75 MG tablet Take 1 tablet (75 mg total) by mouth daily.  . [DISCONTINUED] isosorbide mononitrate (IMDUR) 30 MG 24 hr tablet Take 1 tablet (30 mg total) by mouth daily.  . [DISCONTINUED] losartan (COZAAR) 100 MG tablet Take 1 tablet (100 mg total) by mouth daily.  . [DISCONTINUED] nitroGLYCERIN (NITROSTAT) 0.4 MG SL tablet PLACE 1 TABLET (0.4 MG TOTAL) UNDER THE TONGUE EVERY 5 (FIVE) MINUTES AS NEEDED FOR CHEST PAIN. PLEASE MAKE YEARLY APPT WITH DR. ROSS FOR OCTOBER. 1ST ATTEMPT  . [DISCONTINUED] spironolactone (ALDACTONE) 25 MG tablet Take 1 tablet (25 mg total) by mouth daily.     Allergies:   Iodinated diagnostic agents and Sulfa antibiotics   Social History   Socioeconomic History  . Marital status: Divorced    Spouse name: Not on file  . Number of children: 3  . Years of education: doctorate  . Highest education level: Not on file  Occupational History  . Occupation: Pharmacist, hospital  Social Needs  . Financial resource strain: Not on file  . Food insecurity    Worry: Not on file    Inability: Not on file  . Transportation needs    Medical: Not on file    Non-medical: Not on file  Tobacco Use  . Smoking status: Never  Smoker  . Smokeless tobacco: Never Used  Substance and Sexual Activity  . Alcohol use: No  . Drug use: No  . Sexual activity: Not Currently  Lifestyle  . Physical activity    Days per week: Not on file    Minutes per session: Not on file  . Stress: Not on file  Relationships  . Social Herbalist on phone: Not on file    Gets together: Not on file    Attends religious service: Not on file    Active member of club or organization: Not on file  Attends meetings of clubs or organizations: Not on file    Relationship status: Not on file  Other Topics Concern  . Not on file  Social History Narrative   Caffeine use-yes   Regular exercise-no     Family History:  The patient's   family history includes Diabetes in her mother, sister, sister, and sister; Heart attack in her father and mother; Heart disease in her mother; Hyperlipidemia in her father and mother; Hypertension in her father and mother; Stomach cancer in her sister; Stroke in her father.   ROS:   Please see the history of present illness.    ROS All other systems reviewed and are negative.   PHYSICAL EXAM:   VS:  BP 132/68   Pulse 89   Ht 5\' 5"  (1.651 m)   Wt (!) 316 lb 12.8 oz (143.7 kg)   SpO2 98%   BMI 52.72 kg/m   Physical Exam  GEN: Obese, in no acute distress  HEENT: normal  Neck: no JVD, carotid bruits, or masses Cardiac:RRR; no murmurs, rubs, or gallops  Respiratory:  clear to auscultation bilaterally, normal work of breathing GI: soft, nontender, nondistended, + BS Ext: without cyanosis, clubbing, or edema, Good distal pulses bilaterally MS: no deformity or atrophy  Skin: warm and dry, no rash Neuro:  Alert and Oriented x 3 Psych: euthymic mood, full affect  Wt Readings from Last 3 Encounters:  09/02/19 (!) 316 lb 12.8 oz (143.7 kg)  01/29/19 299 lb (135.6 kg)  01/18/19 298 lb 11.6 oz (135.5 kg)      Studies/Labs Reviewed:   EKG:  EKG is  ordered today.  The ekg ordered today  demonstrates normal sinus rhythm with inferior T wave changes, no acute change from last EKG  Recent Labs: 01/18/2019: ALT 11 01/20/2019: BUN 20; Creatinine, Ser 1.61; Hemoglobin 9.4; Platelets 219; Potassium 4.0; Sodium 139   Lipid Panel    Component Value Date/Time   CHOL 199 01/19/2019 0347   TRIG 49 01/19/2019 0347   HDL 50 01/19/2019 0347   CHOLHDL 4.0 01/19/2019 0347   VLDL 10 01/19/2019 0347   LDLCALC 139 (H) 01/19/2019 0347    Additional studies/ records that were reviewed today include:  2D echocardiogram 01/19/2019  1. The left ventricle has normal systolic function with an ejection fraction of 60-65%. The cavity size was normal. There is severely increased left ventricular wall thickness. Left ventricular diastology could not be evaluated due to nondiagnostic  images.  2. The right ventricle has normal systolic function. The cavity was normal. There is no increase in right ventricular wall thickness.  3. The mitral valve is normal in structure.  4. The tricuspid valve is normal in structure.  5. The aortic valve is tricuspid Mild sclerosis of the aortic valve.  6. The pulmonic valve was normal in structure.  7. The inferior vena cava was dilated in size with >50% respiratory variability.        30-day monitor 9/2018Study Highlights    Poor quality transmission on a few strips Sinus rhythm.  No arrhythmias detected        2D echo 8/27/2018Study Conclusions   - Left ventricle: The cavity size was normal. There was moderate   concentric hypertrophy. Systolic function was vigorous. The   estimated ejection fraction was in the range of 65% to 70%. Wall   motion was normal; there were no regional wall motion   abnormalities. Doppler parameters are consistent with abnormal   left ventricular relaxation (grade  1 diastolic dysfunction).   Doppler parameters are consistent with elevated ventricular   end-diastolic filling pressure. - Aortic valve: Trileaflet; mildly  thickened, mildly calcified   leaflets. There was no regurgitation. - Aortic root: The aortic root was normal in size. - Mitral valve: Calcified annulus. Mildly thickened leaflets .   There was mild regurgitation. - Left atrium: The atrium was mildly dilated. - Right ventricle: The cavity size was normal. Wall thickness was   normal. Systolic function was normal. - Right atrium: The atrium was normal in size. - Tricuspid valve: There was mild regurgitation. - Pulmonic valve: There was no regurgitation. - Pulmonary arteries: Systolic pressure was within the normal   range. - Inferior vena cava: The vessel was normal in size. - Pericardium, extracardiac: There was no pericardial effusion.    Cardiac cath 4/30/2018Prod Diag 1 Lesion, 90 % (progression of disease from last cath 60%)  Mid RCA DES stentt 0 %stenosed.  A OPTIMIZE STUDY Drug Eluting Stent (2.5 mm 18 mm - post-dilated to 2.7 mm) was successfully placed.  Mid LAD lesion, 45 %stenosed. at prior PTCA site  Post intervention, there is a 0% residual stenosis.  LV end diastolic pressure is mildly elevated.       ASSESSMENT:    1. Stable angina pectoris ([place])   2. Essential hypertension   3. Coronary artery disease involving native coronary artery, angina presence unspecified, unspecified whether native or transplanted heart   4. History of CVA (cerebrovascular accident)   5. Mixed hyperlipidemia   6. Type 2 diabetes mellitus without complication, without long-term current use of insulin (HCC)   7. History of syncope   8. Morbid obesity ([place])      PLAN:  In order of problems listed above:  Chest pain consistent with progressive angina.See below   CAD status post DES to the RCA 10/2015, DES to the diagonal and POBA of the LAD 02/2017 patent RCA stent normal LV function. Now with one week of progressive exertional angina.  Discussed with Dr. Tamala Julian who recommends cardiac catheterization.  Patient says he had trouble  going in her arm the last time and had to go through her groin.  Will increase Imdur to 60 mg daily.  Hold Spironolactone and losartan the day of the cath because of a creatinine of 1.6 in March but will see what it is today and decide if we need to hold it the day before. Continue Plavix aspirin and Lipitor I have reviewed the risks, indications, and alternatives to angioplasty and stenting with the patient. Risks include but are not limited to bleeding, infection, vascular injury, stroke, myocardial infection, arrhythmia, kidney injury, radiation-related injury in the case of prolonged fluoroscopy use, emergency cardiac surgery, and death. The patient understands the risks of serious complication is low (<4%) and patient agrees to proceed.    Right brain CVA treated TPA 01/2019. Echo normal LVEF no source embolus. LDL 139 A1C7.7.  Never had a Holter to rule out A. fib.    Essential hypertension blood pressure well controlled but on carvedilol 50 mg twice daily.  Will decrease this to 25 mg twice daily and reassess at follow-up.   Hypercholesterolemia LDL  139 01/2019 on lipitor 80 mg. Goal less than 70. Needs repeat.  DM type II managed by PCP A1C 7.7   History of syncope 02/2017 felt to be secondary to dehydration.  No recurrence.   Morbid obesity weight loss recommended        Medication Adjustments/Labs and Tests  Ordered: Current medicines are reviewed at length with the patient today.  Concerns regarding medicines are outlined above.  Medication changes, Labs and Tests ordered today are listed in the Patient Instructions below. Patient Instructions  Medication Instructions:  Your physician has recommended you make the following change in your medication:   1. INCREASE: isosorbide mononitrate (imdur) to 60 mg by mouth once a day  2. DECREASE: carvedilol 25 mg tablet: Take 1 tablet by mouth twice a day   Lab work: TODAY: CBC, BMET  Your physician recommends that you return for a  FASTING lipid profile   If you have labs (blood work) drawn today and your tests are completely normal, you will receive your results only by: [name] MyChart Message (if you have MyChart) OR . A paper copy in the mail If you have any lab test that is abnormal or we need to change your treatment, we will call you to review the results.  Testing/Procedures: Your physician has requested that you have a cardiac catheterization. Cardiac catheterization is used to diagnose and/or treat various heart conditions. Doctors may recommend this procedure for a number of different reasons. The most common reason is to evaluate chest pain. Chest pain can be a symptom of coronary artery disease (CAD), and cardiac catheterization can show whether plaque is narrowing or blocking your heart's arteries. This procedure is also used to evaluate the valves, as well as measure the blood flow and oxygen levels in different parts of your heart. For further information please visit [email or web address]. Please follow instruction sheet, as given.  Follow-Up: .   Any Other Special Instructions Will Be Listed Below (If Applicable).       Sumner Boast, PA-C  09/02/2019 12:57 PM    [facility] [address] [place], [place], [place]  [address] Phone: [phone]; Fax: [phone]

## 2019-09-01 NOTE — Progress Notes (Signed)
Cardiology Office Note    Date:  09/02/2019   ID:  Alicia, [name] 09-17-1946, MRN [id number]  PCP:  Jearld Fenton, NP  Cardiologist: [name], MD EPS: None  Chief Complaint  Patient presents with  . Chest Pain    History of Present Illness:  Alicia [name] is a 73 y.o. female  with history of CAD status post DES to the RCA 10/2015, repeat cath 03/18/2017 for chest pain DES to diagonal 1, 45% mid LAD treated with POBA, prior RCA stent was patent normal LVEF.  Also has hypertension, HLD and DM.  History of syncope 06/2017 CT MRI and carotid Dopplers were all normal.  Echo normal LVEF 65 to 70%.  30-day monitor showed normal sinus rhythm no arrhythmias detected.  Patient felt she was dehydrated.   I saw the patient 08/2018 and had lost 40 lbs on began diet.  Patient suffered a right brain CVA treated TPA 01/2019. Echo normal LVEF no source embolus. LDL 139 A1C 7.7. stayed on plavix and ASA.  Patient added onto my schedule for mild chest pain. Last Friday after making the bed and cleaning the bathroom she had a squeezing chest pain into left arm-tingling. Went away when she rested but when she got up it came back. [name] Has had everyday with activity. Was having some angina prior to that but thought it was stress-lost 2 friends from covid19.     Past Medical History:  Diagnosis Date  . Anemia   . Arthritis    "right knee" (10/21/2015)  . CAD (coronary artery disease)    a. Cath 10/21/15: s/p DES to RCA: moderate diffuse stenosis of mod diagonal, diffuse irregularity LCx and LAD. 2D echo 10/21/15: mod LVH, EF 60-65%, no RWMA, calcified MV, mod-severe LAE. 03/18/17 PCI with DES--> 1 diag  . Cataract   . Essential hypertension   . Family history of adverse reaction to anesthesia    "oldest sister, [name], related to brain formation at back of head; when they put her to sleep it's hard to wake her up"  . GERD (gastroesophageal reflux disease)   . History of blood transfusion    "related to ruptured tubal pregnancy"  . Hyperlipidemia   . Hypertensive heart disease   . Morbid obesity ([place])   . Type II diabetes mellitus ([place])     Past Surgical History:  Procedure Laterality Date  . ABDOMINAL HYSTERECTOMY  1988  . APPENDECTOMY  1985  . [place] SURGERY  1984  . CARDIAC CATHETERIZATION N/A 10/21/2015   Procedure: Right/Left Heart Cath and Coronary Angiography;  Surgeon: [name], MD;  Location: [facility];  Service: Cardiovascular;  Laterality: N/A;  . CHOLECYSTECTOMY OPEN  1984  . CORONARY STENT INTERVENTION N/A 03/18/2017   Procedure: Coronary Stent Intervention;  Surgeon: [name], MD;  Location: [facility];  Service: Cardiovascular;  Laterality: N/A;  . CORONARY STENT PLACEMENT  03/18/2017   A OPTIMIZE STUDY Drug Eluting Stent (2.5 mm 18 mm - post-dilated to 2.7 mm) was successfully placed  . ECTOPIC PREGNANCY SURGERY    . LEFT HEART CATH AND CORONARY ANGIOGRAPHY N/A 03/18/2017   Procedure: Left Heart Cath and Coronary Angiography;  Surgeon: [name], MD;  Location: [facility];  Service: Cardiovascular;  Laterality: N/A;  . REDUCTION MAMMAPLASTY Bilateral ~ 1976  . TONSILLECTOMY      Current Medications: Current Meds  Medication Sig  . amLODipine (NORVASC) 10 MG tablet Take 1  tablet (10 mg total) by mouth daily.  [name] aspirin 81 MG tablet Take 81 mg by mouth daily.  [name] atorvastatin (LIPITOR) 80 MG tablet Take 1 tablet (80 mg total) by mouth daily at 6 PM.  . carvedilol (COREG) 25 MG tablet Take 1 tablet (25 mg total) by mouth 2 (two) times daily.  . clopidogrel (PLAVIX) 75 MG tablet Take 1 tablet (75 mg total) by mouth daily.  . ferrous sulfate 325 (65 FE) MG tablet TAKE 1 TABLET BY MOUTH EVERY DAY  . glipiZIDE (GLUCOTROL) 10 MG tablet Take 1 tablet (10 mg total) by mouth 2 (two) times daily before a meal.  . isosorbide mononitrate (IMDUR) 60 MG 24 hr tablet Take 1 tablet (60 mg total) by mouth daily.  [name] linagliptin  (TRADJENTA) 5 MG TABS tablet Take 1 tablet (5 mg total) by mouth daily. MUST SCHEDULE PHYSICAL  . losartan (COZAAR) 100 MG tablet Take 1 tablet (100 mg total) by mouth daily.  . nitroGLYCERIN (NITROSTAT) 0.4 MG SL tablet Place 1 tablet (0.4 mg total) under the tongue every 5 (five) minutes as needed for chest pain.  [name] spironolactone (ALDACTONE) 25 MG tablet Take 1 tablet (25 mg total) by mouth daily.  . [DISCONTINUED] amLODipine (NORVASC) 10 MG tablet TAKE 1 TABLET (10 MG TOTAL) BY MOUTH DAILY. MUST SCHEDULE PHYSICAL EXAM  . [DISCONTINUED] atorvastatin (LIPITOR) 80 MG tablet Take 1 tablet (80 mg total) by mouth daily at 6 PM.  . [DISCONTINUED] carvedilol (COREG) 25 MG tablet Take 2 tablets (50 mg total) by mouth 2 (two) times daily.  . [DISCONTINUED] clopidogrel (PLAVIX) 75 MG tablet Take 1 tablet (75 mg total) by mouth daily.  . [DISCONTINUED] isosorbide mononitrate (IMDUR) 30 MG 24 hr tablet Take 1 tablet (30 mg total) by mouth daily.  . [DISCONTINUED] losartan (COZAAR) 100 MG tablet Take 1 tablet (100 mg total) by mouth daily.  . [DISCONTINUED] nitroGLYCERIN (NITROSTAT) 0.4 MG SL tablet PLACE 1 TABLET (0.4 MG TOTAL) UNDER THE TONGUE EVERY 5 (FIVE) MINUTES AS NEEDED FOR CHEST PAIN. PLEASE MAKE YEARLY APPT WITH DR. ROSS FOR OCTOBER. 1ST ATTEMPT  . [DISCONTINUED] spironolactone (ALDACTONE) 25 MG tablet Take 1 tablet (25 mg total) by mouth daily.     Allergies:   Iodinated diagnostic agents and Sulfa antibiotics   Social History   Socioeconomic History  . Marital status: Divorced    Spouse name: Not on file  . Number of children: 3  . Years of education: doctorate  . Highest education level: Not on file  Occupational History  . Occupation: Pharmacist, hospital  Social Needs  . Financial resource strain: Not on file  . Food insecurity    Worry: Not on file    Inability: Not on file  . Transportation needs    Medical: Not on file    Non-medical: Not on file  Tobacco Use  . Smoking status: Never  Smoker  . Smokeless tobacco: Never Used  Substance and Sexual Activity  . Alcohol use: No  . Drug use: No  . Sexual activity: Not Currently  Lifestyle  . Physical activity    Days per week: Not on file    Minutes per session: Not on file  . Stress: Not on file  Relationships  . Social Herbalist on phone: Not on file    Gets together: Not on file    Attends religious service: Not on file    Active member of club or organization: Not on file  Attends meetings of clubs or organizations: Not on file    Relationship status: Not on file  Other Topics Concern  . Not on file  Social History Narrative   Caffeine use-yes   Regular exercise-no     Family History:  The patient's   family history includes Diabetes in her mother, sister, sister, and sister; Heart attack in her father and mother; Heart disease in her mother; Hyperlipidemia in her father and mother; Hypertension in her father and mother; Stomach cancer in her sister; Stroke in her father.   ROS:   Please see the history of present illness.    ROS All other systems reviewed and are negative.   PHYSICAL EXAM:   VS:  BP 132/68   Pulse 89   Ht 5\' 5"  (1.651 m)   Wt (!) 316 lb 12.8 oz (143.7 kg)   SpO2 98%   BMI 52.72 kg/m   Physical Exam  GEN: Obese, in no acute distress  HEENT: normal  Neck: no JVD, carotid bruits, or masses Cardiac:RRR; no murmurs, rubs, or gallops  Respiratory:  clear to auscultation bilaterally, normal work of breathing GI: soft, nontender, nondistended, + BS Ext: without cyanosis, clubbing, or edema, Good distal pulses bilaterally MS: no deformity or atrophy  Skin: warm and dry, no rash Neuro:  Alert and Oriented x 3 Psych: euthymic mood, full affect  Wt Readings from Last 3 Encounters:  09/02/19 (!) 316 lb 12.8 oz (143.7 kg)  01/29/19 299 lb (135.6 kg)  01/18/19 298 lb 11.6 oz (135.5 kg)      Studies/Labs Reviewed:   EKG:  EKG is  ordered today.  The ekg ordered today  demonstrates normal sinus rhythm with inferior T wave changes, no acute change from last EKG  Recent Labs: 01/18/2019: ALT 11 01/20/2019: BUN 20; Creatinine, Ser 1.61; Hemoglobin 9.4; Platelets 219; Potassium 4.0; Sodium 139   Lipid Panel    Component Value Date/Time   CHOL 199 01/19/2019 0347   TRIG 49 01/19/2019 0347   HDL 50 01/19/2019 0347   CHOLHDL 4.0 01/19/2019 0347   VLDL 10 01/19/2019 0347   LDLCALC 139 (H) 01/19/2019 0347    Additional studies/ records that were reviewed today include:  2D echocardiogram 01/19/2019  1. The left ventricle has normal systolic function with an ejection fraction of 60-65%. The cavity size was normal. There is severely increased left ventricular wall thickness. Left ventricular diastology could not be evaluated due to nondiagnostic  images.  2. The right ventricle has normal systolic function. The cavity was normal. There is no increase in right ventricular wall thickness.  3. The mitral valve is normal in structure.  4. The tricuspid valve is normal in structure.  5. The aortic valve is tricuspid Mild sclerosis of the aortic valve.  6. The pulmonic valve was normal in structure.  7. The inferior vena cava was dilated in size with >50% respiratory variability.        30-day monitor 9/2018Study Highlights    Poor quality transmission on a few strips Sinus rhythm.  No arrhythmias detected        2D echo 8/27/2018Study Conclusions   - Left ventricle: The cavity size was normal. There was moderate   concentric hypertrophy. Systolic function was vigorous. The   estimated ejection fraction was in the range of 65% to 70%. Wall   motion was normal; there were no regional wall motion   abnormalities. Doppler parameters are consistent with abnormal   left ventricular relaxation (grade  1 diastolic dysfunction).   Doppler parameters are consistent with elevated ventricular   end-diastolic filling pressure. - Aortic valve: Trileaflet; mildly  thickened, mildly calcified   leaflets. There was no regurgitation. - Aortic root: The aortic root was normal in size. - Mitral valve: Calcified annulus. Mildly thickened leaflets .   There was mild regurgitation. - Left atrium: The atrium was mildly dilated. - Right ventricle: The cavity size was normal. Wall thickness was   normal. Systolic function was normal. - Right atrium: The atrium was normal in size. - Tricuspid valve: There was mild regurgitation. - Pulmonic valve: There was no regurgitation. - Pulmonary arteries: Systolic pressure was within the normal   range. - Inferior vena cava: The vessel was normal in size. - Pericardium, extracardiac: There was no pericardial effusion.    Cardiac cath 4/30/2018Prod Diag 1 Lesion, 90 % (progression of disease from last cath 60%)  Mid RCA DES stentt 0 %stenosed.  A OPTIMIZE STUDY Drug Eluting Stent (2.5 mm 18 mm - post-dilated to 2.7 mm) was successfully placed.  Mid LAD lesion, 45 %stenosed. at prior PTCA site  Post intervention, there is a 0% residual stenosis.  LV end diastolic pressure is mildly elevated.       ASSESSMENT:    1. Stable angina pectoris ([place])   2. Essential hypertension   3. Coronary artery disease involving native coronary artery, angina presence unspecified, unspecified whether native or transplanted heart   4. History of CVA (cerebrovascular accident)   5. Mixed hyperlipidemia   6. Type 2 diabetes mellitus without complication, without long-term current use of insulin (HCC)   7. History of syncope   8. Morbid obesity ([place])      PLAN:  In order of problems listed above:  Chest pain consistent with progressive angina.See below   CAD status post DES to the RCA 10/2015, DES to the diagonal and POBA of the LAD 02/2017 patent RCA stent normal LV function. Now with one week of progressive exertional angina.  Discussed with Dr. Tamala Julian who recommends cardiac catheterization.  Patient says he had trouble  going in her arm the last time and had to go through her groin.  Will increase Imdur to 60 mg daily.  Hold Spironolactone and losartan the day of the cath because of a creatinine of 1.6 in March but will see what it is today and decide if we need to hold it the day before. Continue Plavix aspirin and Lipitor I have reviewed the risks, indications, and alternatives to angioplasty and stenting with the patient. Risks include but are not limited to bleeding, infection, vascular injury, stroke, myocardial infection, arrhythmia, kidney injury, radiation-related injury in the case of prolonged fluoroscopy use, emergency cardiac surgery, and death. The patient understands the risks of serious complication is low (<7%) and patient agrees to proceed.    Right brain CVA treated TPA 01/2019. Echo normal LVEF no source embolus. LDL 139 A1C7.7.  Never had a Holter to rule out A. fib.    Essential hypertension blood pressure well controlled but on carvedilol 50 mg twice daily.  Will decrease this to 25 mg twice daily and reassess at follow-up.   Hypercholesterolemia LDL  139 01/2019 on lipitor 80 mg. Goal less than 70. Needs repeat.  DM type II managed by PCP A1C 7.7   History of syncope 02/2017 felt to be secondary to dehydration.  No recurrence.   Morbid obesity weight loss recommended        Medication Adjustments/Labs and Tests  Ordered: Current medicines are reviewed at length with the patient today.  Concerns regarding medicines are outlined above.  Medication changes, Labs and Tests ordered today are listed in the Patient Instructions below. Patient Instructions  Medication Instructions:  Your physician has recommended you make the following change in your medication:   1. INCREASE: isosorbide mononitrate (imdur) to 60 mg by mouth once a day  2. DECREASE: carvedilol 25 mg tablet: Take 1 tablet by mouth twice a day   Lab work: TODAY: CBC, BMET  Your physician recommends that you return for a  FASTING lipid profile   If you have labs (blood work) drawn today and your tests are completely normal, you will receive your results only by: [name] MyChart Message (if you have MyChart) OR . A paper copy in the mail If you have any lab test that is abnormal or we need to change your treatment, we will call you to review the results.  Testing/Procedures: Your physician has requested that you have a cardiac catheterization. Cardiac catheterization is used to diagnose and/or treat various heart conditions. Doctors may recommend this procedure for a number of different reasons. The most common reason is to evaluate chest pain. Chest pain can be a symptom of coronary artery disease (CAD), and cardiac catheterization can show whether plaque is narrowing or blocking your heart's arteries. This procedure is also used to evaluate the valves, as well as measure the blood flow and oxygen levels in different parts of your heart. For further information please visit [facility]. Please follow instruction sheet, as given.  Follow-Up: .   Any Other Special Instructions Will Be Listed Below (If Applicable).       Sumner Boast, PA-C  09/02/2019 12:57 PM    [facility] [place], [place], [place]  [address] Phone: [phone]; Fax: [phone]

## 2019-09-02 ENCOUNTER — Encounter: Payer: Self-pay | Admitting: Physician Assistant

## 2019-09-02 ENCOUNTER — Other Ambulatory Visit: Payer: Self-pay

## 2019-09-02 ENCOUNTER — Ambulatory Visit: Payer: Medicare Other | Admitting: Physician Assistant

## 2019-09-02 VITALS — BP 132/68 | HR 89 | Ht 65.0 in | Wt 316.8 lb

## 2019-09-02 DIAGNOSIS — E782 Mixed hyperlipidemia: Secondary | ICD-10-CM

## 2019-09-02 DIAGNOSIS — I2510 Atherosclerotic heart disease of native coronary artery without angina pectoris: Secondary | ICD-10-CM | POA: Diagnosis not present

## 2019-09-02 DIAGNOSIS — I208 Other forms of angina pectoris: Secondary | ICD-10-CM | POA: Diagnosis not present

## 2019-09-02 DIAGNOSIS — Z87898 Personal history of other specified conditions: Secondary | ICD-10-CM

## 2019-09-02 DIAGNOSIS — I10 Essential (primary) hypertension: Secondary | ICD-10-CM | POA: Diagnosis not present

## 2019-09-02 DIAGNOSIS — Z8673 Personal history of transient ischemic attack (TIA), and cerebral infarction without residual deficits: Secondary | ICD-10-CM

## 2019-09-02 DIAGNOSIS — E119 Type 2 diabetes mellitus without complications: Secondary | ICD-10-CM

## 2019-09-02 DIAGNOSIS — I2089 Other forms of angina pectoris: Secondary | ICD-10-CM

## 2019-09-02 MED ORDER — CARVEDILOL 25 MG PO TABS: 25.0000 mg | ORAL_TABLET | Freq: Two times a day (BID) | ORAL | 3 refills | Status: DC

## 2019-09-02 MED ORDER — ISOSORBIDE MONONITRATE ER 60 MG PO TB24: 60.0000 mg | ORAL_TABLET | Freq: Every day | ORAL | 3 refills | Status: DC

## 2019-09-02 MED ORDER — LOSARTAN POTASSIUM 100 MG PO TABS: 100.0000 mg | ORAL_TABLET | Freq: Every day | ORAL | 3 refills | Status: DC

## 2019-09-02 MED ORDER — SPIRONOLACTONE 25 MG PO TABS: 25.0000 mg | ORAL_TABLET | Freq: Every day | ORAL | 3 refills | Status: DC

## 2019-09-02 MED ORDER — ATORVASTATIN CALCIUM 80 MG PO TABS: 80.0000 mg | ORAL_TABLET | Freq: Every day | ORAL | 3 refills | Status: DC

## 2019-09-02 MED ORDER — CLOPIDOGREL BISULFATE 75 MG PO TABS: 75.0000 mg | ORAL_TABLET | Freq: Every day | ORAL | 3 refills | Status: DC

## 2019-09-02 MED ORDER — AMLODIPINE BESYLATE 10 MG PO TABS: 10.0000 mg | ORAL_TABLET | Freq: Every day | ORAL | 3 refills | Status: DC

## 2019-09-02 MED ORDER — NITROGLYCERIN 0.4 MG SL SUBL: 0.4000 mg | SUBLINGUAL_TABLET | SUBLINGUAL | 3 refills | Status: AC | PRN

## 2019-09-02 NOTE — Patient Instructions (Addendum)
Medication Instructions:  Your physician has recommended you make the following change in your medication:   1. INCREASE: isosorbide mononitrate (imdur) to 60 mg by mouth once a day  2. DECREASE: carvedilol 25 mg tablet: Take 1 tablet by mouth twice a day   Lab work: TODAY: CBC, BMET  Your physician recommends that you return for a FASTING lipid profile   If you have labs (blood work) drawn today and your tests are completely normal, you will receive your results only by: [name] MyChart Message (if you have MyChart) OR . A paper copy in the mail If you have any lab test that is abnormal or we need to change your treatment, we will call you to review the results.  Testing/Procedures: Your physician has requested that you have a cardiac catheterization. Cardiac catheterization is used to diagnose and/or treat various heart conditions. Doctors may recommend this procedure for a number of different reasons. The most common reason is to evaluate chest pain. Chest pain can be a symptom of coronary artery disease (CAD), and cardiac catheterization can show whether plaque is narrowing or blocking your heart's arteries. This procedure is also used to evaluate the valves, as well as measure the blood flow and oxygen levels in different parts of your heart. For further information please visit [facility]. Please follow instruction sheet, as given.  Follow-Up: . Follow up with Kathyrn Drown, NP on 09/17/19 at 10:30 AM  Any Other Special Instructions Will Be Listed Below (If Applicable).     [facility] [place], [place] [address] Dept: [phone] Loc: [phone]  Malik Ruffino  09/02/2019  You are scheduled for a Cardiac Catheterization on Friday, October 16 with Dr. Harrell Gave End.  1. Please arrive at the [facility] (Main Entrance A) at [facility]: [place], [place] [address] at 10:00 AM (This time is two hours before your procedure to ensure your preparation). Free valet parking service is available.   Special note: Every effort is made to have your procedure done on time. Please understand that emergencies sometimes delay scheduled procedures.  2. Diet: Do not eat solid foods after midnight.  The patient may have clear liquids until 5am upon the day of the procedure.  3. Labs: TODAY: CBC, BMET  Your Pre-procedure COVID-19 Testing will be done on 09/03/19 at 8:40 AM at [facility] at [address], [place], [place] [address]. Once you arrive at the testing site, stay in the right hand lane, go under the building overhang not the tent. If you are tested under the tent your results may not be back before your procedure. Please be on time for your appointment.  After your swab you will be given a mask to wear and instructed to go home and quarantine/no visitors until after your procedure. If you test positive you will be notified and your procedure will be cancelled.    4. Medication instructions in preparation for your procedure:   Contrast Allergy: No  DO NOT take glipizide the morning of the procedure  DO NOT take tradjenta the morning of the procedure  DO NOT take losartan the morning of the procedure  DO NOT take spironolactone the morning of the procedure  On the morning of your procedure, take your Aspirin and Plavix/Clopidogrel and any morning medicines NOT listed above.  You may use sips of water.  5. Plan  for one night stay--bring personal belongings. 6. Bring a current list of your medications and current insurance cards. 7. You MUST have a responsible person to drive you home. 8. Someone MUST be with you the first 24 hours after you arrive home or your discharge will be delayed. 9. Please wear clothes that are easy to get on and off and wear slip-on shoes.  Thank you for allowing [place] to  care for you!   -- [facility]

## 2019-09-03 ENCOUNTER — Other Ambulatory Visit (HOSPITAL_COMMUNITY)
Admission: RE | Admit: 2019-09-03 | Discharge: 2019-09-03 | Disposition: A | Discharge status: Home / Self Care | Payer: Medicare Other | Source: Ambulatory Visit | Attending: Internal Medicine | Admitting: Internal Medicine

## 2019-09-03 ENCOUNTER — Other Ambulatory Visit: Payer: Self-pay | Admitting: Physician Assistant

## 2019-09-03 ENCOUNTER — Telehealth: Payer: Self-pay | Admitting: *Deleted

## 2019-09-03 DIAGNOSIS — Z20828 Contact with and (suspected) exposure to other viral communicable diseases: Secondary | ICD-10-CM | POA: Diagnosis not present

## 2019-09-03 DIAGNOSIS — Z01812 Encounter for preprocedural laboratory examination: Secondary | ICD-10-CM | POA: Diagnosis present

## 2019-09-03 LAB — SARS CORONAVIRUS 2 (TAT 6-24 HRS): SARS Coronavirus 2: NEGATIVE

## 2019-09-03 LAB — CBC
Hematocrit: 31.1 % — ABNORMAL LOW (ref 34.0–46.6)
Hemoglobin: 9.3 g/dL — ABNORMAL LOW (ref 11.1–15.9)
MCH: 22.1 pg — ABNORMAL LOW (ref 26.6–33.0)
MCHC: 29.9 g/dL — ABNORMAL LOW (ref 31.5–35.7)
MCV: 74 fL — ABNORMAL LOW (ref 79–97)
Platelets: 242 10*3/uL (ref 150–450)
RBC: 4.2 x10E6/uL (ref 3.77–5.28)
RDW: 15 % (ref 11.7–15.4)
WBC: 5.6 10*3/uL (ref 3.4–10.8)

## 2019-09-03 LAB — BASIC METABOLIC PANEL
BUN/Creatinine Ratio: 16 (ref 12–28)
BUN: 28 mg/dL — ABNORMAL HIGH (ref 8–27)
CO2: 22 mmol/L (ref 20–29)
Calcium: 9.1 mg/dL (ref 8.7–10.3)
Chloride: 104 mmol/L (ref 96–106)
Creatinine, Ser: 1.74 mg/dL — ABNORMAL HIGH (ref 0.57–1.00)
GFR calc Af Amer: 34 mL/min/{1.73_m2} — ABNORMAL LOW (ref >59)
GFR calc non Af Amer: 29 mL/min/{1.73_m2} — ABNORMAL LOW (ref >59)
Glucose: 69 mg/dL (ref 65–99)
Potassium: 4.8 mmol/L (ref 3.5–5.2)
Sodium: 142 mmol/L (ref 134–144)

## 2019-09-03 MED ORDER — PREDNISONE 50 MG PO TABS: ORAL_TABLET | ORAL | 0 refills | Status: DC

## 2019-09-03 NOTE — Telephone Encounter (Signed)
Pt contacted pre-catheterization scheduled at [facility] for: Friday September 04, 2019 12 noon Verified arrival time and place: [facility]) at: 7 AM-pre procedure hydration   No solid food after midnight prior to cath, clear liquids until 5 AM day of procedure.  Contrast allergy: yes-13 hour Prednisone and Benadryl Prep reviewed with patient: Prednisone 50 mg 11 PM 09/03/19  Prednisone 50mg   5 AM 09/04/19  Prednisone 50 mg and Benadryl 50 mg -Since she is arriving early for hydration, I have instructed patient to take medication with her to hospital  and take at hospital morning of procedure 09/04/19 10 AM.  Hold: Losartan-day before and day of procedure-GFR 34 Spironolactone-day before and day of procedure-GFR 34 Metformin-day of procedure and 48 hours post procedure. Januvia-AM of procedure. Glipizide-AM of procedure. Tradjenta-AM of procedure.  Except hold medications AM meds can be  taken pre-cath with sip of water including: ASA 81 mg Plavix 75mg   Confirmed patient has responsible adult to drive home post procedure and observe 24 hours after arriving home: yes  Currently, due to Covid-19 pandemic, only one support person will be allowed with patient. Must be the same support person for that patient's entire stay, will be screened and required to wear a mask. They will be asked to wait in the waiting room for the duration of the patient's stay.  Patients are required to wear a mask when they enter the hospital.      COVID-19 Pre-Screening Questions:  . In the past 7 to 10 days have you had a cough,  shortness of breath, headache, congestion, fever (100 or greater) body aches, chills, sore throat, or sudden loss of taste or sense of smell? no . Have you been around anyone with known Covid 19? no . Have you been around anyone who is awaiting Covid 19 test results in the past 7 to 10 days? no . Have you been around anyone who has been exposed  to Covid 19, or has mentioned symptoms of Covid 19 within the past 7 to 10 days? no   I reviewed procedure/mask/visitor instructions, Covid-19 screening questions with patient, she verbalized understanding, thanked me for call.

## 2019-09-04 ENCOUNTER — Other Ambulatory Visit: Payer: Self-pay

## 2019-09-04 ENCOUNTER — Telehealth: Payer: Self-pay | Admitting: *Deleted

## 2019-09-04 ENCOUNTER — Encounter (HOSPITAL_COMMUNITY)
Admission: RE | Disposition: A | Discharge status: Home / Self Care | Payer: Self-pay | Source: Home / Self Care | Attending: Internal Medicine

## 2019-09-04 ENCOUNTER — Ambulatory Visit (HOSPITAL_COMMUNITY)
Admission: RE | Admit: 2019-09-04 | Discharge: 2019-09-04 | Disposition: A | Discharge status: Home / Self Care | Payer: Medicare Other | Attending: Internal Medicine | Admitting: Internal Medicine

## 2019-09-04 DIAGNOSIS — N189 Chronic kidney disease, unspecified: Secondary | ICD-10-CM

## 2019-09-04 DIAGNOSIS — I200 Unstable angina: Secondary | ICD-10-CM | POA: Diagnosis present

## 2019-09-04 DIAGNOSIS — E782 Mixed hyperlipidemia: Secondary | ICD-10-CM | POA: Diagnosis not present

## 2019-09-04 DIAGNOSIS — Z833 Family history of diabetes mellitus: Secondary | ICD-10-CM | POA: Diagnosis not present

## 2019-09-04 DIAGNOSIS — Z955 Presence of coronary angioplasty implant and graft: Secondary | ICD-10-CM | POA: Diagnosis not present

## 2019-09-04 DIAGNOSIS — K219 Gastro-esophageal reflux disease without esophagitis: Secondary | ICD-10-CM | POA: Insufficient documentation

## 2019-09-04 DIAGNOSIS — I119 Hypertensive heart disease without heart failure: Secondary | ICD-10-CM | POA: Insufficient documentation

## 2019-09-04 DIAGNOSIS — E1136 Type 2 diabetes mellitus with diabetic cataract: Secondary | ICD-10-CM | POA: Diagnosis not present

## 2019-09-04 DIAGNOSIS — Z91041 Radiographic dye allergy status: Secondary | ICD-10-CM | POA: Diagnosis not present

## 2019-09-04 DIAGNOSIS — Z882 Allergy status to sulfonamides status: Secondary | ICD-10-CM | POA: Insufficient documentation

## 2019-09-04 DIAGNOSIS — M1712 Unilateral primary osteoarthritis, left knee: Secondary | ICD-10-CM | POA: Diagnosis not present

## 2019-09-04 DIAGNOSIS — Z6841 Body Mass Index (BMI) 40.0 and over, adult: Secondary | ICD-10-CM | POA: Diagnosis not present

## 2019-09-04 DIAGNOSIS — Z90710 Acquired absence of both cervix and uterus: Secondary | ICD-10-CM | POA: Insufficient documentation

## 2019-09-04 DIAGNOSIS — Z823 Family history of stroke: Secondary | ICD-10-CM | POA: Diagnosis not present

## 2019-09-04 DIAGNOSIS — Z8673 Personal history of transient ischemic attack (TIA), and cerebral infarction without residual deficits: Secondary | ICD-10-CM | POA: Diagnosis not present

## 2019-09-04 DIAGNOSIS — Z9884 Bariatric surgery status: Secondary | ICD-10-CM | POA: Insufficient documentation

## 2019-09-04 DIAGNOSIS — I25110 Atherosclerotic heart disease of native coronary artery with unstable angina pectoris: Secondary | ICD-10-CM

## 2019-09-04 DIAGNOSIS — I2510 Atherosclerotic heart disease of native coronary artery without angina pectoris: Secondary | ICD-10-CM

## 2019-09-04 DIAGNOSIS — I208 Other forms of angina pectoris: Secondary | ICD-10-CM

## 2019-09-04 DIAGNOSIS — Z8249 Family history of ischemic heart disease and other diseases of the circulatory system: Secondary | ICD-10-CM | POA: Diagnosis not present

## 2019-09-04 DIAGNOSIS — Z79899 Other long term (current) drug therapy: Secondary | ICD-10-CM | POA: Insufficient documentation

## 2019-09-04 DIAGNOSIS — Z7984 Long term (current) use of oral hypoglycemic drugs: Secondary | ICD-10-CM | POA: Insufficient documentation

## 2019-09-04 DIAGNOSIS — Z7982 Long term (current) use of aspirin: Secondary | ICD-10-CM | POA: Diagnosis not present

## 2019-09-04 DIAGNOSIS — Z7902 Long term (current) use of antithrombotics/antiplatelets: Secondary | ICD-10-CM | POA: Diagnosis not present

## 2019-09-04 HISTORY — PX: CORONARY PRESSURE/FFR STUDY: CATH118243

## 2019-09-04 HISTORY — PX: LEFT HEART CATH AND CORONARY ANGIOGRAPHY: CATH118249

## 2019-09-04 HISTORY — PX: INTRAVASCULAR PRESSURE WIRE/FFR STUDY: CATH118243

## 2019-09-04 LAB — GLUCOSE, CAPILLARY: Glucose-Capillary: 262 mg/dL — ABNORMAL HIGH (ref 70–99)

## 2019-09-04 LAB — POCT ACTIVATED CLOTTING TIME: Activated Clotting Time: 296 seconds

## 2019-09-04 SURGERY — LEFT HEART CATH AND CORONARY ANGIOGRAPHY
Anesthesia: LOCAL

## 2019-09-04 MED ORDER — ONDANSETRON HCL 4 MG/2ML IJ SOLN: 4.0000 mg | Freq: Four times a day (QID) | INTRAMUSCULAR | Status: DC | PRN

## 2019-09-04 MED ORDER — LABETALOL HCL 5 MG/ML IV SOLN: 10.0000 mg | INTRAVENOUS | Status: DC | PRN

## 2019-09-04 MED ORDER — LIDOCAINE HCL (PF) 1 % IJ SOLN
INTRAMUSCULAR | Status: AC
  Filled 2019-09-04: qty 30

## 2019-09-04 MED ORDER — SODIUM CHLORIDE 0.9% FLUSH: 3.0000 mL | Freq: Two times a day (BID) | INTRAVENOUS | Status: DC

## 2019-09-04 MED ORDER — HYDRALAZINE HCL 20 MG/ML IJ SOLN: 10.0000 mg | INTRAMUSCULAR | Status: DC | PRN

## 2019-09-04 MED ORDER — HEPARIN SODIUM (PORCINE) 1000 UNIT/ML IJ SOLN
INTRAMUSCULAR | Status: AC
  Filled 2019-09-04: qty 1

## 2019-09-04 MED ORDER — CLOPIDOGREL BISULFATE 75 MG PO TABS: 75.0000 mg | ORAL_TABLET | Freq: Every day | ORAL | Status: DC

## 2019-09-04 MED ORDER — SODIUM CHLORIDE 0.9% FLUSH: 3.0000 mL | INTRAVENOUS | Status: DC | PRN

## 2019-09-04 MED ORDER — SODIUM CHLORIDE 0.9 % WEIGHT BASED INFUSION
3.0000 mL/kg/h | INTRAVENOUS | Status: AC
  Administered 2019-09-04: 3 mL/kg/h via INTRAVENOUS

## 2019-09-04 MED ORDER — FENTANYL CITRATE (PF) 100 MCG/2ML IJ SOLN
INTRAMUSCULAR | Status: AC
  Filled 2019-09-04: qty 2

## 2019-09-04 MED ORDER — FENTANYL CITRATE (PF) 100 MCG/2ML IJ SOLN
INTRAMUSCULAR | Status: DC | PRN
  Administered 2019-09-04: 25 ug via INTRAVENOUS

## 2019-09-04 MED ORDER — ACETAMINOPHEN 325 MG PO TABS: 650.0000 mg | ORAL_TABLET | ORAL | Status: DC | PRN

## 2019-09-04 MED ORDER — ASPIRIN 81 MG PO CHEW: 81.0000 mg | CHEWABLE_TABLET | ORAL | Status: DC

## 2019-09-04 MED ORDER — ASPIRIN 81 MG PO CHEW: 81.0000 mg | CHEWABLE_TABLET | Freq: Every day | ORAL | Status: DC

## 2019-09-04 MED ORDER — SODIUM CHLORIDE 0.9 % IV SOLN: 250.0000 mL | INTRAVENOUS | Status: DC | PRN

## 2019-09-04 MED ORDER — HEPARIN (PORCINE) IN NACL 1000-0.9 UT/500ML-% IV SOLN
INTRAVENOUS | Status: AC
  Filled 2019-09-04: qty 1000

## 2019-09-04 MED ORDER — CARVEDILOL 25 MG PO TABS: 50.0000 mg | ORAL_TABLET | Freq: Two times a day (BID) | ORAL | 3 refills | Status: DC

## 2019-09-04 MED ORDER — HEPARIN (PORCINE) IN NACL 1000-0.9 UT/500ML-% IV SOLN
INTRAVENOUS | Status: AC
  Filled 2019-09-04: qty 500

## 2019-09-04 MED ORDER — VERAPAMIL HCL 2.5 MG/ML IV SOLN
INTRAVENOUS | Status: DC | PRN
  Administered 2019-09-04: 12:00:00 10 mL via INTRA_ARTERIAL

## 2019-09-04 MED ORDER — IOHEXOL 350 MG/ML SOLN
INTRAVENOUS | Status: DC | PRN
  Administered 2019-09-04: 70 mL

## 2019-09-04 MED ORDER — METFORMIN HCL 1000 MG PO TABS: 1000.0000 mg | ORAL_TABLET | Freq: Two times a day (BID) | ORAL | Status: DC

## 2019-09-04 MED ORDER — HEPARIN SODIUM (PORCINE) 1000 UNIT/ML IJ SOLN
INTRAMUSCULAR | Status: DC | PRN
  Administered 2019-09-04: 5000 [IU] via INTRAVENOUS
  Administered 2019-09-04: 7000 [IU] via INTRAVENOUS

## 2019-09-04 MED ORDER — NITROGLYCERIN 1 MG/10 ML FOR IR/CATH LAB
INTRA_ARTERIAL | Status: DC | PRN
  Administered 2019-09-04: 200 ug via INTRACORONARY

## 2019-09-04 MED ORDER — LIDOCAINE HCL (PF) 1 % IJ SOLN
INTRAMUSCULAR | Status: DC | PRN
  Administered 2019-09-04: 5 mL

## 2019-09-04 MED ORDER — HEPARIN (PORCINE) IN NACL 1000-0.9 UT/500ML-% IV SOLN
INTRAVENOUS | Status: DC | PRN
  Administered 2019-09-04 (×2): 500 mL

## 2019-09-04 MED ORDER — MIDAZOLAM HCL 2 MG/2ML IJ SOLN
INTRAMUSCULAR | Status: AC
  Filled 2019-09-04: qty 2

## 2019-09-04 MED ORDER — SODIUM CHLORIDE 0.9 % IV SOLN: INTRAVENOUS | Status: AC

## 2019-09-04 MED ORDER — MIDAZOLAM HCL 2 MG/2ML IJ SOLN
INTRAMUSCULAR | Status: DC | PRN
  Administered 2019-09-04: 1 mg via INTRAVENOUS

## 2019-09-04 MED ORDER — SODIUM CHLORIDE 0.9 % WEIGHT BASED INFUSION: 1.0000 mL/kg/h | INTRAVENOUS | Status: DC

## 2019-09-04 MED ORDER — VERAPAMIL HCL 2.5 MG/ML IV SOLN
INTRAVENOUS | Status: AC
  Filled 2019-09-04: qty 2

## 2019-09-04 SURGICAL SUPPLY — 15 items
CATH INFINITI 5FR JK (CATHETERS) ×1 IMPLANT
CATH LAUNCHER 6FR EBU 3.75 (CATHETERS) ×1 IMPLANT
CATH VISTA GUIDE 6FR XBLAD3.5 (CATHETERS) ×1 IMPLANT
DEVICE RAD COMP TR BAND LRG (VASCULAR PRODUCTS) ×2 IMPLANT
GLIDESHEATH SLEND SS 6F .021 (SHEATH) ×1 IMPLANT
GUIDEWIRE ANGLED .035X260CM (WIRE) ×1 IMPLANT
GUIDEWIRE INQWIRE 1.5J.035X260 (WIRE) IMPLANT
GUIDEWIRE PRESSURE COMET II (WIRE) ×1 IMPLANT
HOVERMATT SINGLE USE (MISCELLANEOUS) ×1 IMPLANT
INQWIRE 1.5J .035X260CM (WIRE) ×2
KIT ESSENTIALS PG (KITS) ×1 IMPLANT
KIT HEART LEFT (KITS) ×2 IMPLANT
PACK CARDIAC CATHETERIZATION (CUSTOM PROCEDURE TRAY) ×2 IMPLANT
TRANSDUCER W/STOPCOCK (MISCELLANEOUS) ×2 IMPLANT
TUBING CIL FLEX 10 FLL-RA (TUBING) ×2 IMPLANT

## 2019-09-04 NOTE — Interval H&P Note (Signed)
History and Physical Interval Note:  09/04/2019 11:07 AM  Alicia [name]  has presented today for cardiac catheterization, with the diagnosis of unstable angina.  The various methods of treatment have been discussed with the patient and family. After consideration of risks, benefits and other options for treatment, the patient has consented to  Procedure(s): LEFT HEART CATH AND CORONARY ANGIOGRAPHY (N/A) as a surgical intervention.  The patient's history has been reviewed, patient examined, no change in status, stable for surgery.  I have reviewed the patient's chart and labs.  Questions were answered to the patient's satisfaction.    Cath Lab Visit (complete for each Cath Lab visit)  Clinical Evaluation Leading to the Procedure:   ACS: Yes.    Non-ACS:    Anginal Classification: CCS IV  Anti-ischemic medical therapy: Maximal Therapy (2 or more classes of medications)  Non-Invasive Test Results: No non-invasive testing performed  Prior CABG: No previous CABG  Alicia [name]

## 2019-09-04 NOTE — Telephone Encounter (Signed)
-----   Message from Nelva Bush, MD sent at 09/04/2019 12:46 PM EDT ----- Regarding: Post-cath labs Hello,  Ms. Halbur has CKD and underwent a diagnostic catheterization.  She will be discharged this afternoon.  Can she be set up for a BMP on Monday (10/19) to reassess her kidney function and determine if it is safe for her to restart metformin?  Please let me know if any questions or concerns come up.  Thanks.  Gerald Stabs

## 2019-09-04 NOTE — Progress Notes (Signed)
Client had c/o feeling like left hand swollen in comparison to right hand; left hand elevated on pillow; when I left the room, client states "it's bleeding" and tr band off; new tr band placed by cath lab staff

## 2019-09-04 NOTE — Discharge Instructions (Signed)
Moderate Conscious Sedation, Adult, Care After These instructions provide you with information about caring for yourself after your procedure. Your health care provider may also give you more specific instructions. Your treatment has been planned according to current medical practices, but problems sometimes occur. Call your health care provider if you have any problems or questions after your procedure. What can I expect after the procedure? After your procedure, it is common:  To feel sleepy for several hours.  To feel clumsy and have poor balance for several hours.  To have poor judgment for several hours.  To vomit if you eat too soon. Follow these instructions at home: For at least 24 hours after the procedure:   Do not: ? Participate in activities where you could fall or become injured. ? Drive. ? Use heavy machinery. ? Drink alcohol. ? Take sleeping pills or medicines that cause drowsiness. ? Make important decisions or sign legal documents. ? Take care of children on your own.  Rest. Eating and drinking  Follow the diet recommended by your health care provider.  If you vomit: ? Drink water, juice, or soup when you can drink without vomiting. ? Make sure you have little or no nausea before eating solid foods. General instructions  Have a responsible adult stay with you until you are awake and alert.  Take over-the-counter and prescription medicines only as told by your health care provider.  If you smoke, do not smoke without supervision.  Keep all follow-up visits as told by your health care provider. This is important. Contact a health care provider if:  You keep feeling nauseous or you keep vomiting.  You feel light-headed.  You develop a rash.  You have a fever. Get help right away if:  You have trouble breathing. This information is not intended to replace advice given to you by your health care provider. Make sure you discuss any questions you have  with your health care provider. Document Released: 08/26/2013 Document Revised: 10/18/2017 Document Reviewed: 02/25/2016 Elsevier Patient Education  2020 [place]  This sheet gives you information about how to care for yourself after your procedure. Your health care provider may also give you more specific instructions. If you have problems or questions, contact your health care provider. What can I expect after the procedure? After the procedure, it is common to have:  Bruising and tenderness at the catheter insertion area. Follow these instructions at home: Medicines  Take over-the-counter and prescription medicines only as told by your health care provider. Insertion site care  Follow instructions from your health care provider about how to take care of your insertion site. Make sure you: ? Wash your hands with soap and water before you change your bandage (dressing). If soap and water are not available, use hand sanitizer. ? Change your dressing as told by your health care provider. ? Leave stitches (sutures), skin glue, or adhesive strips in place. These skin closures may need to stay in place for 2 weeks or longer. If adhesive strip edges start to loosen and curl up, you may trim the loose edges. Do not remove adhesive strips completely unless your health care provider tells you to do that.  Check your insertion site every day for signs of infection. Check for: ? Redness, swelling, or pain. ? Fluid or blood. ? Pus or a bad smell. ? Warmth.  Do not take baths, swim, or use a hot tub until your health care  provider approves.  You may shower 24-48 hours after the procedure, or as directed by your health care provider. ? Remove the dressing and gently wash the site with plain soap and water. ? Pat the area dry with a clean towel. ? Do not rub the site. That could cause bleeding.  Do not apply powder or lotion to the site. Activity   For 24  hours after the procedure, or as directed by your health care provider: ? Do not flex or bend the affected arm. ? Do not push or pull heavy objects with the affected arm. ? Do not drive yourself home from the hospital or clinic. You may drive 24 hours after the procedure unless your health care provider tells you not to. ? Do not operate machinery or power tools.  Do not lift anything that is heavier than 10 lb (4.5 kg), or the limit that you are told, until your health care provider says that it is safe.  Ask your health care provider when it is okay to: ? Return to work or school. ? Resume usual physical activities or sports. ? Resume sexual activity. General instructions  If the catheter site starts to bleed, raise your arm and put firm pressure on the site. If the bleeding does not stop, get help right away. This is a medical emergency.  If you went home on the same day as your procedure, a responsible adult should be with you for the first 24 hours after you arrive home.  Keep all follow-up visits as told by your health care provider. This is important. Contact a health care provider if:  You have a fever.  You have redness, swelling, or yellow drainage around your insertion site. Get help right away if:  You have unusual pain at the radial site.  The catheter insertion area swells very fast.  The insertion area is bleeding, and the bleeding does not stop when you hold steady pressure on the area.  Your arm or hand becomes pale, cool, tingly, or numb. These symptoms may represent a serious problem that is an emergency. Do not wait to see if the symptoms will go away. Get medical help right away. Call your local emergency services (911 in the [place] Do not drive yourself to the hospital. Summary  After the procedure, it is common to have bruising and tenderness at the site.  Follow instructions from your health care provider about how to take care of your radial site wound.  Check the wound every day for signs of infection.  Do not lift anything that is heavier than 10 lb (4.5 kg), or the limit that you are told, until your health care provider says that it is safe. This information is not intended to replace advice given to you by your health care provider. Make sure you discuss any questions you have with your health care provider. Document Released: 12/08/2010 Document Revised: 12/11/2017 Document Reviewed: 12/11/2017 Elsevier Patient Education  [employer].     We will make arrangements for you to have labs drawn at the [facility] office on [facility]. on Monday (09/07/2019) to ensure that your kidney function is stable.

## 2019-09-04 NOTE — Telephone Encounter (Signed)
See documentation below

## 2019-09-04 NOTE — Telephone Encounter (Deleted)
-----   Message from Imogene Burn, PA-C sent at 09/04/2019 12:57 PM EDT ----- Regarding: FW: Post-cath labs Can you set this up? Thanks  Selinda Eon ----- Message ----- From: Nelva Bush, MD Sent: 09/04/2019  12:46 PM EDT To: Imogene Burn, PA-C, [facility] Triage Subject: Post-cath labs                                 Hello,  Alicia [name] has CKD and underwent a diagnostic catheterization.  She will be discharged this afternoon.  Can she be set up for a BMP on Monday (10/19) to reassess her kidney function and determine if it is safe for her to restart metformin?  Please let me know if any questions or concerns come up.  Thanks.  Gerald Stabs

## 2019-09-04 NOTE — Progress Notes (Signed)
Client took benadryl 50mg  and prednisone 50mg  po as ordered from home

## 2019-09-04 NOTE — Telephone Encounter (Signed)
Order placed for BMP on October 19.  Lab appointment scheduled for 11:30 on October 19.  Pt will need to be notified of time once discharged from hospital today.

## 2019-09-07 ENCOUNTER — Other Ambulatory Visit: Payer: Medicare Other | Admitting: *Deleted

## 2019-09-07 ENCOUNTER — Other Ambulatory Visit: Payer: Self-pay

## 2019-09-07 ENCOUNTER — Encounter (HOSPITAL_COMMUNITY): Payer: Self-pay | Admitting: Internal Medicine

## 2019-09-07 DIAGNOSIS — N189 Chronic kidney disease, unspecified: Secondary | ICD-10-CM

## 2019-09-07 MED FILL — Heparin Sod (Porcine)-NaCl IV Soln 1000 Unit/500ML-0.9%: INTRAVENOUS | Qty: 500 | Status: AC

## 2019-09-07 NOTE — Telephone Encounter (Signed)
Called and spoke to patient she is aware of her lab appt today for BMET.

## 2019-09-07 NOTE — Telephone Encounter (Signed)
Patient here for blood work and asked to see nurse to assess cath site. The left wrist is slightly swollen with bruising that extends approx 1/3-1/2 way down her forearm.  Her sister marked with a pen today around the dark bruised area because it is worse today than yesterday when she removed the bandage.  Radial pulse is present and strong. She has no tingling, no numbness, equal strength and temp bilaterally and normal range of motion in this extremity. Adv that spreading of blood under the skin is not uncommon and it is good that it is marked to make it easier to monitor. I adv her to continue to monitor and if any changes or new symptoms to call back.   Also adv to hold metformin until results of BMET today.  Pt verbalizes understanding and agreement.

## 2019-09-08 LAB — BASIC METABOLIC PANEL
BUN/Creatinine Ratio: 15 (ref 12–28)
BUN: 23 mg/dL (ref 8–27)
CO2: 24 mmol/L (ref 20–29)
Calcium: 8.6 mg/dL — ABNORMAL LOW (ref 8.7–10.3)
Chloride: 108 mmol/L — ABNORMAL HIGH (ref 96–106)
Creatinine, Ser: 1.49 mg/dL — ABNORMAL HIGH (ref 0.57–1.00)
GFR calc Af Amer: 40 mL/min/{1.73_m2} — ABNORMAL LOW (ref >59)
GFR calc non Af Amer: 35 mL/min/{1.73_m2} — ABNORMAL LOW (ref >59)
Glucose: 198 mg/dL — ABNORMAL HIGH (ref 65–99)
Potassium: 4.5 mmol/L (ref 3.5–5.2)
Sodium: 143 mmol/L (ref 134–144)

## 2019-09-09 NOTE — Progress Notes (Signed)
Cardiology Office Note   Date:  09/17/2019   ID:  Alicia [name] 02/21/1946, MRN [id number]  PCP:  Alicia Fenton, NP  Cardiologist: Dr. [name], MD  Chief Complaint  Patient presents with  . Follow-up    History of Present Illness: Alicia [name] is a 73 y.o. female who presents for post cardiac catheterization follow-up, seen for Alicia [name].  Alicia [name] has a history of CAD status post DES to the RCA 10/2015, repeat cath 03/18/2017 for chest pain with DES to diagonal 1, 45% mid LAD treated with POBA, prior RCA stent was patent with a normal LVEF.  Also has a history of hypertension, hyperlipidemia, DM 2 and syncope (CT MRI and carotid Dopplers were all normal).  Echocardiogram at that time showed normal LVEF at 65 to 70%.  A 30-day monitor showed normal sinus rhythm with no arrhythmias in which the patient was felt to be dehydrated as her syncopal etiology.  Early 01/2019 patient suffered a right brain CVA treated with TPA.  Echocardiogram with normal LVEF and no source of embolus.  LDL was elevated at 139.  She was continued on Plavix and ASA therapies.  She was last seen by Alicia Gouge, PA 09/02/2019 in which she had mild chest pain complaints.  She reported while making her bed and cleaning her bathroom several days prior she began having a squeezing chest pain sensation which radiated to her left arm with associated tingling.  Patient sat down in the sensation went away however returned when activity is restarted.  Case was discussed with Alicia [name] who recommended to pursue a cardiac catheterization.  Her Imdur was increased to 60 mg daily  Cardiac cath performed 09/04/2019 which revealed moderate, nonobstructive coronary artery disease.  Most severe lesions were noted to be 50 to 60% in the mid LAD and 50% ostial D1 which were not thought to be hemodynamically significant by DFR.  Her stents were patent in D1 and proximal RCA.  Plan was to optimize medical therapy.  Her  carvedilol was increased to 50 mg twice daily.   Today Alicia [name] is doing well from a cardiac perspective.  Reports she is feeling better since her cardiac cath.  We discussed risk reduction plan including better BP, DM 2 and HLD control.  Denies anginal symptoms, shortness of breath.  Has some LE swelling however this is chronic and unchanged.  She has been off of her Lasix 20 mg p.o. daily since cardiac cath given renal dysfunction.  BMET obtained prior to appointment.  Will call with results in hopes to resume Lasix.  Discussed better LDL control.  Options for initiating Zetia versus lipid clinic referral were discussed.  She prefers to start Alicia [name].  Cath site is stable with some bruising.  No pain.   Past Medical History:  Diagnosis Date  . Anemia   . Arthritis    "right knee" (10/21/2015)  . CAD (coronary artery disease)    a. Cath 10/21/15: s/p DES to RCA: moderate diffuse stenosis of mod diagonal, diffuse irregularity LCx and LAD. 2D echo 10/21/15: mod LVH, EF 60-65%, no RWMA, calcified MV, mod-severe LAE. 03/18/17 PCI with DES--> 1 diag  . Cataract   . Essential hypertension   . Family history of adverse reaction to anesthesia    "oldest sister, Alicia [name], related to brain formation at back of head; when they put her to sleep it's hard to wake her up"  . GERD (gastroesophageal reflux disease)   .  History of blood transfusion    "related to ruptured tubal pregnancy"  . Hyperlipidemia   . Hypertensive heart disease   . Morbid obesity ([place])   . Type II diabetes mellitus ([place])     Past Surgical History:  Procedure Laterality Date  . ABDOMINAL HYSTERECTOMY  1988  . APPENDECTOMY  1985  . [place] SURGERY  1984  . CARDIAC CATHETERIZATION N/A 10/21/2015   Procedure: Right/Left Heart Cath and Coronary Angiography;  Surgeon: [name], MD;  Location: [facility];  Service: Cardiovascular;  Laterality: N/A;  . CHOLECYSTECTOMY OPEN  1984  . CORONARY STENT INTERVENTION N/A 03/18/2017    Procedure: Coronary Stent Intervention;  Surgeon: [name], MD;  Location: [place] [facility];  Service: Cardiovascular;  Laterality: N/A;  . CORONARY STENT PLACEMENT  03/18/2017   A OPTIMIZE STUDY Drug Eluting Stent (2.5 mm 18 mm - post-dilated to 2.7 mm) was successfully placed  . ECTOPIC PREGNANCY SURGERY    . INTRAVASCULAR PRESSURE WIRE/FFR STUDY N/A 09/04/2019   Procedure: INTRAVASCULAR PRESSURE WIRE/FFR STUDY;  Surgeon: Nelva Bush, MD;  Location: [facility];  Service: Cardiovascular;  Laterality: N/A;  . LEFT HEART CATH AND CORONARY ANGIOGRAPHY N/A 03/18/2017   Procedure: Left Heart Cath and Coronary Angiography;  Surgeon: [name], MD;  Location: [place];  Service: Cardiovascular;  Laterality: N/A;  . LEFT HEART CATH AND CORONARY ANGIOGRAPHY N/A 09/04/2019   Procedure: LEFT HEART CATH AND CORONARY ANGIOGRAPHY;  Surgeon: Nelva Bush, MD;  Location: [place] [facility];  Service: Cardiovascular;  Laterality: N/A;  . REDUCTION MAMMAPLASTY Bilateral ~ 1976  . TONSILLECTOMY       Current Outpatient Medications  Medication Sig Dispense Refill  . amLODipine (NORVASC) 10 MG tablet Take 1 tablet (10 mg total) by mouth daily. 90 tablet 3  . aspirin 81 MG tablet Take 81 mg by mouth daily.    [name] atorvastatin (LIPITOR) 80 MG tablet Take 1 tablet (80 mg total) by mouth daily at 6 PM. 90 tablet 3  . carvedilol (COREG) 25 MG tablet Take 2 tablets (50 mg total) by mouth 2 (two) times daily with a meal. 180 tablet 3  . clopidogrel (PLAVIX) 75 MG tablet Take 1 tablet (75 mg total) by mouth daily. 90 tablet 3  . doxylamine, Sleep, (UNISOM) 25 MG tablet Take 25 mg by mouth at bedtime.    . ferrous sulfate 325 (65 FE) MG tablet TAKE 1 TABLET BY MOUTH EVERY DAY 30 tablet 0  . furosemide (LASIX) 20 MG tablet Take 20 mg by mouth daily.    [name] glipiZIDE (GLUCOTROL) 10 MG tablet Take 1 tablet (10 mg total) by mouth 2 (two) times daily before a meal. 60 tablet 2  .  isosorbide mononitrate (IMDUR) 60 MG 24 hr tablet Take 1 tablet (60 mg total) by mouth daily. 90 tablet 3  . linagliptin (TRADJENTA) 5 MG TABS tablet Take 1 tablet (5 mg total) by mouth daily. MUST SCHEDULE PHYSICAL 30 tablet 0  . losartan (COZAAR) 100 MG tablet Take 1 tablet (100 mg total) by mouth daily. 90 tablet 3  . metFORMIN (GLUCOPHAGE) 1000 MG tablet TAKE 1 TABLET (1,000 MG TOTAL) BY MOUTH 2 (TWO) TIMES DAILY WITH A MEAL. 180 tablet 2  . nitroGLYCERIN (NITROSTAT) 0.4 MG SL tablet Place 1 tablet (0.4 mg total) under the tongue every 5 (five) minutes as needed for chest pain. 25 tablet 3  . sitaGLIPtin (JANUVIA) 100 MG tablet Take 100 mg  by mouth daily.    [name] spironolactone (ALDACTONE) 25 MG tablet Take 1 tablet (25 mg total) by mouth daily. 90 tablet 3   No current facility-administered medications for this visit.     Allergies:   Iodinated diagnostic agents and Sulfa antibiotics    Social History:  The patient  reports that she has never smoked. She has never used smokeless tobacco. She reports that she does not drink alcohol or use drugs.   Family History:  The patient's family history includes Diabetes in her mother, sister, sister, and sister; Heart attack in her father and mother; Heart disease in her mother; Hyperlipidemia in her father and mother; Hypertension in her father and mother; Stomach cancer in her sister; Stroke in her father.    ROS:  Please see the history of present illness. Otherwise, review of systems are positive for none.   All other systems are reviewed and negative.    PHYSICAL EXAM: VS:  BP (!) 146/78   Pulse 78   Ht 5\' 5"  (1.651 m)   Wt (!) 319 lb (144.7 kg)   SpO2 100%   BMI 53.08 kg/m  , BMI Body mass index is 53.08 kg/m.   General: Obese, NAD Skin: Warm, dry, intact  Lungs:Clear to ausculation bilaterally. No wheezes, rales, or rhonchi. Breathing is unlabored. Cardiovascular: RRR with S1 S2. No murmurs Extremities: 1+ LE edema Neuro: Alert and  oriented. No focal deficits. No facial asymmetry. MAE spontaneously. Psych: Responds to questions appropriately with normal affect.    EKG:  EKG is not ordered today.  Recent Labs: 01/18/2019: ALT 11 09/02/2019: Hemoglobin 9.3; Platelets 242 09/07/2019: BUN 23; Creatinine, Ser 1.49; Potassium 4.5; Sodium 143   Lipid Panel    Component Value Date/Time   CHOL 199 01/19/2019 0347   TRIG 49 01/19/2019 0347   HDL 50 01/19/2019 0347   CHOLHDL 4.0 01/19/2019 0347   VLDL 10 01/19/2019 0347   LDLCALC 139 (H) 01/19/2019 0347      Wt Readings from Last 3 Encounters:  09/17/19 (!) 319 lb (144.7 kg)  09/04/19 (!) 312 lb (141.5 kg)  09/02/19 (!) 316 lb 12.8 oz (143.7 kg)    Other studies Reviewed: Additional studies/ records that were reviewed today include: Cardiac catheterization 09/04/2019:  Conclusions: 1. Moderate, non-obstructive coronary artery disease.  Most severe lesions are 50-60% mid LAD and 50% ostial D1 stenoses, which are not hemodynamically significant by DFR. 2. Widely patent D1 and proximal RCA stents. 3. Normal left ventricular filling pressure.  Recommendations: 1. Optimize medical therapy; I will increase carvedilol to 50 mg BID. 2. Check BMP next week (09/07/2019).  Metformin should be held pending BMP, given CKD stage 3 and today's contrast exposure. 3. Aggressive secondary prevention.  Improved blood pressure control and weight loss would likely be beneficial.  ASSESSMENT AND PLAN:  1.  CAD: -Previous stenting the D1 and proximal RCA with patent stents per cardiac cath with other moderate, nonobstructive CAD.  50 to 60% stenosis in mid LAD and 50% ostial D1 which were thought to be not hemodynamically significant by DFR -Continue aggressive risk reduction including optimal LDL, BP and DM control -Last LDL noted to be 139 on 01/19/2019 -Continue carvedilol 50 mg twice daily, Plavix 75, ASA 81, atorvastatin 80, Imdur 60  2.  Right brain CVA treated with TPA:  -Stable, no symptoms -Continue ASA and Plavix  3.  Hypertension: -Stable, 146/78>>> reports better control at home -May need further titration -Continue amlodipine 10, losartan 100, carvedilol  50 mg twice daily, spironolactone 25  4.  HLD: -Last LDL 139 on 01/2019 -On high intensity Lipitor 80 mg with an LDL goal of less than 70 -Plan for referral to lipid clinic for possible PCSK9 inhibitors for more aggressive reduction  5.  DM2: -Last hemoglobin A1c, 7.7 -Managed by PCP -On good regimen with Tradjenta, glipizide and metformin  6.  History of syncope 02/2017: -Felt to be secondary to dehydration his cardiac work-up was negative   Current medicines are reviewed at length with the patient today.  The patient does not have concerns regarding medicines.  The following changes have been made: None   Labs/ tests ordered today include: Lipid clinic referral  Orders Placed This Encounter  Procedures  . AMB Referral to [facility]   Disposition:   FU with Alicia [name] in 3 months  Signed, Kathyrn Drown, NP  09/17/2019 [address], [place], [place]  [address] Phone: [phone]; Fax: [phone]

## 2019-09-11 ENCOUNTER — Other Ambulatory Visit: Payer: Self-pay | Admitting: Cardiology

## 2019-09-14 NOTE — Telephone Encounter (Signed)
Please review for refill.  

## 2019-09-16 ENCOUNTER — Other Ambulatory Visit: Payer: Self-pay | Admitting: Internal Medicine

## 2019-09-17 ENCOUNTER — Encounter: Payer: Self-pay | Admitting: Cardiology

## 2019-09-17 ENCOUNTER — Other Ambulatory Visit: Payer: Self-pay

## 2019-09-17 ENCOUNTER — Ambulatory Visit (INDEPENDENT_AMBULATORY_CARE_PROVIDER_SITE_OTHER): Payer: Medicare Other | Admitting: Cardiology

## 2019-09-17 ENCOUNTER — Other Ambulatory Visit: Payer: Medicare Other | Admitting: *Deleted

## 2019-09-17 VITALS — BP 146/78 | HR 78 | Ht 65.0 in | Wt 319.0 lb

## 2019-09-17 DIAGNOSIS — I2510 Atherosclerotic heart disease of native coronary artery without angina pectoris: Secondary | ICD-10-CM

## 2019-09-17 DIAGNOSIS — N189 Chronic kidney disease, unspecified: Secondary | ICD-10-CM | POA: Diagnosis not present

## 2019-09-17 DIAGNOSIS — E785 Hyperlipidemia, unspecified: Secondary | ICD-10-CM | POA: Diagnosis not present

## 2019-09-17 DIAGNOSIS — E119 Type 2 diabetes mellitus without complications: Secondary | ICD-10-CM

## 2019-09-17 DIAGNOSIS — E782 Mixed hyperlipidemia: Secondary | ICD-10-CM

## 2019-09-17 DIAGNOSIS — I208 Other forms of angina pectoris: Secondary | ICD-10-CM

## 2019-09-17 DIAGNOSIS — I10 Essential (primary) hypertension: Secondary | ICD-10-CM

## 2019-09-17 DIAGNOSIS — Z8673 Personal history of transient ischemic attack (TIA), and cerebral infarction without residual deficits: Secondary | ICD-10-CM

## 2019-09-17 DIAGNOSIS — Z87898 Personal history of other specified conditions: Secondary | ICD-10-CM

## 2019-09-17 LAB — LIPID PANEL
Chol/HDL Ratio: 3.4 ratio (ref 0.0–4.4)
Cholesterol, Total: 163 mg/dL (ref 100–199)
HDL: 48 mg/dL (ref >39)
LDL Chol Calc (NIH): 101 mg/dL — ABNORMAL HIGH (ref 0–99)
Triglycerides: 71 mg/dL (ref 0–149)
VLDL Cholesterol Cal: 14 mg/dL (ref 5–40)

## 2019-09-17 NOTE — Patient Instructions (Addendum)
Medication Instructions:  Your physician recommends that you continue on your current medications as directed. Please refer to the Current Medication list given to you today.  *If you need a refill on your cardiac medications before your next appointment, please call your pharmacy*  Lab Work: None  If you have labs (blood work) drawn today and your tests are completely normal, you will receive your results only by: [name] MyChart Message (if you have MyChart) OR . A paper copy in the mail If you have any lab test that is abnormal or we need to change your treatment, we will call you to review the results.  Testing/Procedures: Your have been referred to see the lipid clinic   Follow-Up: At [facility], you and your health needs are our priority.  As part of our continuing mission to provide you with exceptional heart care, we have created designated Provider Care Teams.  These Care Teams include your primary Cardiologist (physician) and Advanced Practice Providers (APPs -  Physician Assistants and Nurse Practitioners) who all work together to provide you with the care you need, when you need it.  Your next appointment:   3 months  The format for your next appointment:   In Person  Provider:   Dr. Harrington Challenger   Other Instructions

## 2019-09-20 NOTE — Progress Notes (Deleted)
Patient ID: Alicia [name]                 DOB: May 12, 1946                    MRN: [id number]     HPI: Alicia [name] is a 73 y.o. female patient referred to lipid clinic by Dr. Harrington Challenger. PMH is significant for CAD s/p DES to RCA in 2016, repeat cath 03/18/2017 for chest pain DES to diagonal 1, 45% mid LAD treated with POBA, HTN, HLD, right brain CVA treated with TPA in 01/2019, and DM2. Pt was admitted to the hospital for cardiac catheterization on 09/04/19 due to progressive exertional angina for 1 week - showed 50-60% mid LAD and 50% ostial D1 stenoses. Medical therapy was recommended. Patient was last seen by Kathyrn Drown, NP on 09/17/19. At that appointment, medical management optimization options were discussed and patient preferred to add on PCSK9-inhibitor over Zetia for additional LDL reduction with high intensity statin.  Pt presents today in good spirits. She reports tolerating her atorvastatin well. She is excited to restart her vegan diet and restart [employer] workout program when her gym opens back. She lives with her sister and cooks most meals at home. Annual household income ~85k, pt does not think she would qualify for patient assistance for PCSK9i therapy, she is ok with a ~$45/month copay.  Current Medications: atorvastatin 80 mg daily (since 10/2015)  Intolerances: none  Risk Factors: CVA, HTN, HDL, DM, CAD s/p DES  LDL goal: <73m/dL  Diet: Eats smaller more frequent meals per day. Breakfast - egg, toast, piece of fruit. Lunch - raisins or an apple. Mid afternoon - bologna or PB sandwich. Dinner - meat, veggie, starch.  Evening snack - PB crackers. Drinks water or flavored water, 1 cup of coffee in AM.   Exercise: Silver [employer]neakers pre-COVID, not much exercise currently  Family History: Diabetes in her mother, sister, sister, and sister; Heart attack in her father and mother; Heart disease in her mother; Hyperlipidemia in her father and mother; Hypertension in her father and  mother; Stomach cancer in her sister; Stroke in her father.   Social History: Denies tobacco, alcohol, and illicit drug use.   Labs: 09/17/19: TC 163, TG 71, HDL 48, LDL 101 - atorvastatin 80 mg daily 01/19/19: TC 199, TG 49, HDL 50, LDL 139 - atorvastatin 80 mg daily 12/20/17: TC 126, TG 50, HLD 33, LDL 82 - atorvastatin 80 mg daily  Past Medical History:  Diagnosis Date  . Anemia   . Arthritis    "right knee" (10/21/2015)  . CAD (coronary artery disease)    a. Cath 10/21/15: s/p DES to RCA: moderate diffuse stenosis of mod diagonal, diffuse irregularity LCx and LAD. 2D echo 10/21/15: mod LVH, EF 60-65%, no RWMA, calcified MV, mod-severe LAE. 03/18/17 PCI with DES--> 1 diag  . Cataract   . Essential hypertension   . Family history of adverse reaction to anesthesia    "oldest sister, GPeter [place] related to brain formation at back of head; when they put her to sleep it's hard to wake her up"  . GERD (gastroesophageal reflux disease)   . History of blood transfusion    "related to ruptured tubal pregnancy"  . Hyperlipidemia   . Hypertensive heart disease   . Morbid obesity ([place]   . Type II diabetes mellitus ([place]     Current Outpatient Medications on File Prior to Visit  Medication Sig Dispense Refill  .  amLODipine (NORVASC) 10 MG tablet Take 1 tablet (10 mg total) by mouth daily. 90 tablet 3  . aspirin 81 MG tablet Take 81 mg by mouth daily.    [name] atorvastatin (LIPITOR) 80 MG tablet Take 1 tablet (80 mg total) by mouth daily at 6 PM. 90 tablet 3  . carvedilol (COREG) 25 MG tablet Take 2 tablets (50 mg total) by mouth 2 (two) times daily with a meal. 180 tablet 3  . clopidogrel (PLAVIX) 75 MG tablet Take 1 tablet (75 mg total) by mouth daily. 90 tablet 3  . doxylamine, Sleep, (UNISOM) 25 MG tablet Take 25 mg by mouth at bedtime.    . ferrous sulfate 325 (65 FE) MG tablet TAKE 1 TABLET BY MOUTH EVERY DAY 30 tablet 0  . furosemide (LASIX) 20 MG tablet Take 20 mg by mouth daily.    [name] glipiZIDE  (GLUCOTROL) 10 MG tablet Take 1 tablet (10 mg total) by mouth 2 (two) times daily before a meal. 60 tablet 2  . isosorbide mononitrate (IMDUR) 60 MG 24 hr tablet Take 1 tablet (60 mg total) by mouth daily. 90 tablet 3  . JANUVIA 100 MG tablet TAKE 1 TABLET (100 MG TOTAL) BY MOUTH DAILY. MUST RESCHEDULE ANNUAL PHYSICAL 30 tablet 0  . losartan (COZAAR) 100 MG tablet Take 1 tablet (100 mg total) by mouth daily. 90 tablet 3  . metFORMIN (GLUCOPHAGE) 1000 MG tablet TAKE 1 TABLET (1,000 MG TOTAL) BY MOUTH 2 (TWO) TIMES DAILY WITH A MEAL. 180 tablet 2  . nitroGLYCERIN (NITROSTAT) 0.4 MG SL tablet Place 1 tablet (0.4 mg total) under the tongue every 5 (five) minutes as needed for chest pain. 25 tablet 3  . spironolactone (ALDACTONE) 25 MG tablet Take 1 tablet (25 mg total) by mouth daily. 90 tablet 3  . TRADJENTA 5 MG TABS tablet TAKE 1 TABLET (5 MG TOTAL) BY MOUTH DAILY. MUST SCHEDULE PHYSICAL 30 tablet 0   No current facility-administered medications on file prior to visit.     Allergies  Allergen Reactions  . Iodinated Diagnostic Agents Hives  . Sulfa Antibiotics Hives    Assessment/Plan:  1. Hyperlipidemia - LDL is currently 117m/dL on atorvastatin 847mdaily, above goal <5513mL due to history of progressive ASCVD. Discussed benefits, administration technique, and side effect profile of PCSK9i therapy with pt. Will submit prior authorization for Praluent 54m15m Q2W and follow up with pt once approved. Continue atorvastatin 80mg59mly. Encouraged pt to eat a heart healthy diet and increase physical activity. Recheck fasting lipids in 2-3 months.   E. Supple, PharmD, BCACP, CPP [place] [id number]: [phone]: [phone] 4:06 PM   [employer] Evolocumab [id number] No: [id number] ID No: ***     Did subject meet all eligibility criteria?  Code  No Yes  101  45lts age ? 40 years [] [x]  One or  both of the following:   102 A81ospitalization for a clinical ASCVD event: acute (MI), UA, IS, or CLI within 18 months of enrollment        Note: Subjects must have been admitted to the hospital. Those       who are admitted and discharged in less than 24 hours are      Eligible for the study.Subjects who have been admitted to     The ER for a clinical ASCVD. [] [x]  102B Coronary or peripheral revascularization including percutaneous  or surgical revascularization in the past 18 months [x] []  One of the following:  103 A  LDL ? 70 mg/dL (1.81 mmol/L) with no plans for immediate initiation or titration of statin therapy [] [x]  103 B Newly started on PCSK9i after the index hospitalization/procedure and prior to enrollment (but no more than 6 months prior to enrollment) with pre-PCSK9i treatment LDL-C value available and measured within 6 months of starting PCSK9i and known background LLT any time prior to PCSK9i initiation. [x] []  105 Planned follow-up within the health system [] [x]  Subject does not meet any of the following exclusion criteria   201 Unable or unwilling to provide informed consent, including but not limited to cognitive or language barriers (reading or comprehension) [x] []  202 Lack of phone or email for contact [x] []  203 Evidence of end stage renal disease (ESRD) or stage 5 CKD [x] []  204 Anticipated life expectancy less than 6 month  [x] []  206  On a PCSK9i prior to their qualifying event [x] []     Subject Name: Leeah Politano  Subject met inclusion and exclusion criteria.  The informed consent form, study requirements and expectations were reviewed with the subject and questions and concerns were addressed prior to the signing of the consent form.  The subject verbalized understanding of the trial requirements.  The subject agreed to participate in the [place] trial and signed the informed consent at 3:45pm on11/02/20.  The informed consent was obtained prior  to performance of any protocol-specific procedures for the subject.  A copy of the signed informed consent was given to the subject and a copy was placed in the subject's medical record.    Supple   Socioeconomic Status - Baseline  Education Some High School or Less High School graduate Some College/University Graduated College/University or above [] [] [] [x]   Marital Status Married Single Divorced Separated [] [] [x] []   Income <$15,000 $15,001 - $34,999 $35,000 - $74,999 $75,000 - $99,999 >$100,000 [] [] [] [x] []

## 2019-09-21 ENCOUNTER — Ambulatory Visit (INDEPENDENT_AMBULATORY_CARE_PROVIDER_SITE_OTHER): Payer: Medicare Other | Admitting: Pharmacist

## 2019-09-21 ENCOUNTER — Other Ambulatory Visit: Payer: Self-pay

## 2019-09-21 VITALS — BP 146/78 | HR 78 | Ht 65.0 in | Wt 318.8 lb

## 2019-09-21 DIAGNOSIS — E785 Hyperlipidemia, unspecified: Secondary | ICD-10-CM | POA: Diagnosis not present

## 2019-09-21 MED ORDER — PRALUENT 75 MG/ML ~~LOC~~ SOAJ: 1.0000 "pen " | SUBCUTANEOUS | 11 refills | Status: DC

## 2019-09-21 NOTE — Progress Notes (Signed)
Patient ID: Alicia [name]                 DOB: [birth year]                    MRN: [id number]     HPI: Alicia [name] is a 73 y.o. female patient referred to lipid clinic by Dr. Harrington Challenger. PMH is significant for CAD s/p DES to RCA in 2016, repeat cath 03/18/2017 for chest pain DES to diagonal 1, 45% mid LAD treated with POBA, HTN, HLD, right brain CVA treated with TPA in 01/2019, and DM2. Pt was admitted to the hospital for cardiac catheterization on 09/04/19 due to progressive exertional angina for 1 week - showed 50-60% mid LAD and 50% ostial D1 stenoses. Medical therapy was recommended. Patient was last seen by Kathyrn Drown, NP on 09/17/19. At that appointment, medical management optimization options were discussed and patient preferred to add on PCSK9-inhibitor over Zetia for additional LDL reduction with high intensity statin.  Pt presents today in good spirits. She reports tolerating her atorvastatin well. She is excited to restart her vegan diet and restart [employer] workout program when her gym opens back. She lives with her sister and cooks most meals at home. Annual household income ~85k, pt does not think she would qualify for patient assistance for PCSK9i therapy, she is ok with a ~$45/month copay.  Current Medications: atorvastatin 80 mg daily (since 10/2015)  Intolerances: none  Risk Factors: CVA, HTN, HDL, DM, CAD s/p DES  LDL goal: <73m/dL  Diet: Eats smaller more frequent meals per day. Breakfast - egg, toast, piece of fruit. Lunch - raisins or an apple. Mid afternoon - bologna or PB sandwich. Dinner - meat, veggie, starch.  Evening snack - PB crackers. Drinks water or flavored water, 1 cup of coffee in AM.   Exercise: [employer] pre-COVID, not much exercise currently  Family History: Diabetes in her mother, sister, sister, and sister; Heart attack in her father and mother; Heart disease in her mother; Hyperlipidemia in her father and mother; Hypertension in her father and  mother; Stomach cancer in her sister; Stroke in her father.   Social History: Denies tobacco, alcohol, and illicit drug use.   Labs: 09/17/19: TC 163, TG 71, HDL 48, LDL 101 - atorvastatin 80 mg daily 01/19/19: TC 199, TG 49, HDL 50, LDL 139 - atorvastatin 80 mg daily 12/20/17: TC 126, TG 50, HLD 33, LDL 82 - atorvastatin 80 mg daily  Past Medical History:  Diagnosis Date  . Anemia   . Arthritis    "right knee" (10/21/2015)  . CAD (coronary artery disease)    a. Cath 10/21/15: s/p DES to RCA: moderate diffuse stenosis of mod diagonal, diffuse irregularity LCx and LAD. 2D echo 10/21/15: mod LVH, EF 60-65%, no RWMA, calcified MV, mod-severe LAE. 03/18/17 PCI with DES--> 1 diag  . Cataract   . Essential hypertension   . Family history of adverse reaction to anesthesia    "oldest sister, GPeter [place] related to brain formation at back of head; when they put her to sleep it's hard to wake her up"  . GERD (gastroesophageal reflux disease)   . History of blood transfusion    "related to ruptured tubal pregnancy"  . Hyperlipidemia   . Hypertensive heart disease   . Morbid obesity ([place]   . Type II diabetes mellitus (HB[place]     Current Outpatient Medications on File Prior to Visit  Medication Sig Dispense Refill  .  amLODipine (NORVASC) 10 MG tablet Take 1 tablet (10 mg total) by mouth daily. 90 tablet 3  . aspirin 81 MG tablet Take 81 mg by mouth daily.    [name] atorvastatin (LIPITOR) 80 MG tablet Take 1 tablet (80 mg total) by mouth daily at 6 PM. 90 tablet 3  . carvedilol (COREG) 25 MG tablet Take 2 tablets (50 mg total) by mouth 2 (two) times daily with a meal. 180 tablet 3  . clopidogrel (PLAVIX) 75 MG tablet Take 1 tablet (75 mg total) by mouth daily. 90 tablet 3  . doxylamine, Sleep, (UNISOM) 25 MG tablet Take 25 mg by mouth at bedtime.    . ferrous sulfate 325 (65 FE) MG tablet TAKE 1 TABLET BY MOUTH EVERY DAY 30 tablet 0  . furosemide (LASIX) 20 MG tablet Take 20 mg by mouth daily.    [name] glipiZIDE  (GLUCOTROL) 10 MG tablet Take 1 tablet (10 mg total) by mouth 2 (two) times daily before a meal. 60 tablet 2  . isosorbide mononitrate (IMDUR) 60 MG 24 hr tablet Take 1 tablet (60 mg total) by mouth daily. 90 tablet 3  . JANUVIA 100 MG tablet TAKE 1 TABLET (100 MG TOTAL) BY MOUTH DAILY. MUST RESCHEDULE ANNUAL PHYSICAL 30 tablet 0  . losartan (COZAAR) 100 MG tablet Take 1 tablet (100 mg total) by mouth daily. 90 tablet 3  . metFORMIN (GLUCOPHAGE) 1000 MG tablet TAKE 1 TABLET (1,000 MG TOTAL) BY MOUTH 2 (TWO) TIMES DAILY WITH A MEAL. 180 tablet 2  . nitroGLYCERIN (NITROSTAT) 0.4 MG SL tablet Place 1 tablet (0.4 mg total) under the tongue every 5 (five) minutes as needed for chest pain. 25 tablet 3  . spironolactone (ALDACTONE) 25 MG tablet Take 1 tablet (25 mg total) by mouth daily. 90 tablet 3  . TRADJENTA 5 MG TABS tablet TAKE 1 TABLET (5 MG TOTAL) BY MOUTH DAILY. MUST SCHEDULE PHYSICAL 30 tablet 0   No current facility-administered medications on file prior to visit.     Allergies  Allergen Reactions  . Iodinated Diagnostic Agents Hives  . Sulfa Antibiotics Hives    Assessment/Plan:  1. Hyperlipidemia - LDL is currently 164m/dL on atorvastatin 859mdaily, above goal <5572mL due to history of progressive ASCVD. Discussed benefits, administration technique, and side effect profile of PCSK9i therapy with pt. Prior authorization for Praluent submitted and approved, copay $35 at pharmacy. Continue atorvastatin 41m41mily. Encouraged pt to eat a heart healthy diet and increase physical activity. Recheck fasting lipids in 2-3 months.  Decklin Weddington E. Arling Cerone, PharmD, BCACP, CPP [id number] [id number]: [phone]: [phone] 4:06 PM   [employer] Evolocumab [id number] No: [id number] ID No: [id number]     Did subject meet all eligibility criteria?  Code  No Yes  101 73ults age ? 40 years _0  _1   One or both of the  following:   102 A  Hospitalization for a clinical ASCVD event: acute (MI), UA, IS, or CLI within 18 months of enrollment        Note: Subjects must have been admitted to the hospital. Those       who are admitted and discharged in less than 24 hours are      Eligible for the study.Subjects who have been admitted to     The ER for a clinical ASCVD. _2  _3   102B Coronary or peripheral revascularization including percutaneous or surgical revascularization in  the past 18 months _0  _1   One of the following:  103 A  LDL ? 70 mg/dL (1.81 mmol/L) with no plans for immediate initiation or titration of statin therapy _2  _3   103 B Newly started on PCSK9i after the index hospitalization/procedure and prior to enrollment (but no more than 6 months prior to enrollment) with pre-PCSK9i treatment LDL-C value available and measured within 6 months of starting PCSK9i and known background LLT any time prior to PCSK9i initiation. _4  _5   105 Planned follow-up within the health system _6  _7   Subject does not meet any of the following exclusion criteria   201 Unable or unwilling to provide informed consent, including but not limited to cognitive or language barriers (reading or comprehension) _8  _9   202 Lack of phone or email for contact _10  _11   203 Evidence of end stage renal disease (ESRD) or stage 5 CKD _12  _13   204 Anticipated life expectancy less than 6 month  _14  _15   206  On a PCSK9i prior to their qualifying event _16  _17      Subject Name: Jacquelinne Speak  Subject met inclusion and exclusion criteria.  The informed consent form, study requirements and expectations were reviewed with the subject and questions and concerns were addressed prior to the signing of the consent form.  The subject verbalized understanding of the trial requirements.  The subject agreed to participate in the [place] trial and signed the informed consent at 3:45pm on11/02/20.  The informed consent was obtained prior to  performance of any protocol-specific procedures for the subject.  A copy of the signed informed consent was given to the subject and a copy was placed in the subject's medical record.   Azlee Monforte   Socioeconomic Status - Baseline  Education Some High School or Less High School graduate Some College/University Graduated College/University or above _18  _19  _20  _21    Marital Status Married Single Divorced Separated _22  _23  _24  _25    Income <$15,000 $15,001 - $34,999 $35,000 - $74,999 $75,000 - $99,999 >$100,000 _26  _27  _28  _29  _30

## 2019-09-21 NOTE — Patient Instructions (Addendum)
It was nice to meet you today  Your LDL (bad) cholesterol is 101 and your goal is < 55  We will submit paperwork to your insurance to see if they will cover either Repatha or Praluent injections - each of these would lower your LDL cholesterol by 60%. You inject the medication into the fatty tissue of your stomach or upper outer thigh every 2 weeks. Keep the medication in the fridge until you are ready to use it  Recheck fasting cholesterol on Monday, January 18th any time after 7:30am  Call Jinny Blossom, Pharmacist with any concerns [phone]

## 2019-09-22 ENCOUNTER — Other Ambulatory Visit: Payer: Self-pay | Admitting: Internal Medicine

## 2019-09-23 ENCOUNTER — Other Ambulatory Visit: Payer: Self-pay | Admitting: Physician Assistant

## 2019-10-16 ENCOUNTER — Other Ambulatory Visit: Payer: Self-pay | Admitting: Internal Medicine

## 2019-10-23 ENCOUNTER — Other Ambulatory Visit: Payer: Self-pay | Admitting: Internal Medicine

## 2019-10-23 DIAGNOSIS — E119 Type 2 diabetes mellitus without complications: Secondary | ICD-10-CM

## 2019-11-05 ENCOUNTER — Other Ambulatory Visit: Payer: Self-pay | Admitting: Physician Assistant

## 2019-11-05 DIAGNOSIS — I25118 Atherosclerotic heart disease of native coronary artery with other forms of angina pectoris: Secondary | ICD-10-CM

## 2019-11-05 DIAGNOSIS — I10 Essential (primary) hypertension: Secondary | ICD-10-CM

## 2019-11-18 ENCOUNTER — Other Ambulatory Visit: Payer: Self-pay | Admitting: Internal Medicine

## 2019-11-23 ENCOUNTER — Other Ambulatory Visit: Payer: Self-pay | Admitting: Internal Medicine

## 2019-11-24 NOTE — Telephone Encounter (Signed)
Letter mailed letting pt know that future refill request may be denied if physical is not scheduled

## 2019-11-26 ENCOUNTER — Other Ambulatory Visit: Payer: Self-pay | Admitting: Internal Medicine

## 2019-12-07 ENCOUNTER — Other Ambulatory Visit: Payer: Medicare Other

## 2019-12-17 ENCOUNTER — Other Ambulatory Visit: Payer: Medicare PPO

## 2019-12-20 NOTE — Progress Notes (Signed)
Cardiology Office Note   Date:  12/21/2019   ID:  Alicia [name], Alicia [name] 23-Feb-1946, MRN [id number]  PCP:  Jearld Fenton, NP  Cardiologist:   [name], MD    F/U of CAD     History of Present Illness: Alicia [name] is a 74 y.o. female with a history of CAD status post DES to the RCA 10/2015, repeat cath 03/18/2017 for chest pain with DES to diagonal 1, 45% mid LAD treated with POBA, prior RCA stent was patent with a normal LVEF.  Also has a history of hypertension, hyperlipidemia, DM 2 and syncope (CT MRI and carotid Dopplers were all normal).  Echocardiogram at that time showed normal LVEF at 65 to 70%.  A 30-day monitor showed normal sinus rhythm with no arrhythmias in which the patient was felt to be dehydrated as her syncopal etiology.  Early 01/2019 patient suffered a right brain CVA treated with TPA.  Echocardiogram with normal LVEF and no source of embolus.  LDL was elevated at 139.  She was continued on Plavix and ASA therapies.  In October she had some chest pain. Cardiac cath performed 09/04/2019 which revealed moderate, nonobstructive coronary artery disease.  Most severe lesions were noted to be 50 to 60% in the mid LAD and 50% ostial D1 which were not thought to be hemodynamically significant by DFR.  Her stents were patent in D1 and proximal RCA.  Plan was to optimize medical therapy.  Her carvedilol was increased to 50 mg twice daily.   She ws seen by  Tonny Branch in October 2020       Current Meds  Medication Sig  . Alirocumab (PRALUENT) 75 MG/ML SOAJ Inject 1 pen into the skin every 14 (fourteen) days.  [name] amLODipine (NORVASC) 10 MG tablet Take 1 tablet (10 mg total) by mouth daily.  [name] aspirin 81 MG tablet Take 81 mg by mouth daily.  [name] atorvastatin (LIPITOR) 80 MG tablet Take 1 tablet (80 mg total) by mouth daily at 6 PM.  . carvedilol (COREG) 25 MG tablet Take 2 tablets (50 mg total) by mouth 2 (two) times daily with a meal.  . clopidogrel (PLAVIX) 75 MG tablet Take  1 tablet (75 mg total) by mouth daily.  [name] doxylamine, Sleep, (UNISOM) 25 MG tablet Take 25 mg by mouth at bedtime.  . ferrous sulfate 325 (65 FE) MG tablet TAKE 1 TABLET (325 MG TOTAL) BY MOUTH DAILY. LAST REFILL SCHEDULE PHYSICAL  . furosemide (LASIX) 20 MG tablet TAKE 1 TABLET BY MOUTH EVERY DAY  . glipiZIDE (GLUCOTROL) 10 MG tablet Take 1 tablet (10 mg total) by mouth 2 (two) times daily before a meal.  . isosorbide mononitrate (IMDUR) 60 MG 24 hr tablet Take 1 tablet (60 mg total) by mouth daily.  [name] JANUVIA 100 MG tablet TAKE 1 TABLET (100 MG TOTAL) BY MOUTH DAILY. LAST REFILL SCHEDULE PHYSICAL  . losartan (COZAAR) 100 MG tablet Take 1 tablet (100 mg total) by mouth daily.  . metFORMIN (GLUCOPHAGE) 1000 MG tablet TAKE 1 TABLET (1,000 MG TOTAL) BY MOUTH 2 (TWO) TIMES DAILY WITH A MEAL.  . nitroGLYCERIN (NITROSTAT) 0.4 MG SL tablet Place 1 tablet (0.4 mg total) under the tongue every 5 (five) minutes as needed for chest pain.  Glory Rosebush ULTRA test strip USE 2 (TWO) TIMES DAILY.  [name] spironolactone (ALDACTONE) 25 MG tablet Take 1 tablet (25 mg total) by mouth daily.  . TRADJENTA 5 MG TABS tablet TAKE 1 TABLET (  5 MG TOTAL) BY MOUTH DAILY. MUST SCHEDULE PHYSICAL     Allergies:   Iodinated diagnostic agents and Sulfa antibiotics   Past Medical History:  Diagnosis Date  . Anemia   . Arthritis    "right knee" (10/21/2015)  . CAD (coronary artery disease)    a. Cath 10/21/15: s/p DES to RCA: moderate diffuse stenosis of mod diagonal, diffuse irregularity LCx and LAD. 2D echo 10/21/15: mod LVH, EF 60-65%, no RWMA, calcified MV, mod-severe LAE. 03/18/17 PCI with DES--> 1 diag  . Cataract   . Essential hypertension   . Family history of adverse reaction to anesthesia    "oldest sister, [name], related to brain formation at back of head; when they put her to sleep it's hard to wake her up"  . GERD (gastroesophageal reflux disease)   . History of blood transfusion    "related to ruptured tubal  pregnancy"  . Hyperlipidemia   . Hypertensive heart disease   . Morbid obesity ([place])   . Type II diabetes mellitus ([place])     Past Surgical History:  Procedure Laterality Date  . ABDOMINAL HYSTERECTOMY  1988  . APPENDECTOMY  1985  . [place] SURGERY  1984  . CARDIAC CATHETERIZATION N/A 10/21/2015   Procedure: Right/Left Heart Cath and Coronary Angiography;  Surgeon: [name], MD;  Location: [facility];  Service: Cardiovascular;  Laterality: N/A;  . CHOLECYSTECTOMY OPEN  1984  . CORONARY STENT INTERVENTION N/A 03/18/2017   Procedure: Coronary Stent Intervention;  Surgeon: [name], MD;  Location: [facility];  Service: Cardiovascular;  Laterality: N/A;  . CORONARY STENT PLACEMENT  03/18/2017   A OPTIMIZE STUDY Drug Eluting Stent (2.5 mm 18 mm - post-dilated to 2.7 mm) was successfully placed  . ECTOPIC PREGNANCY SURGERY    . INTRAVASCULAR PRESSURE WIRE/FFR STUDY N/A 09/04/2019   Procedure: INTRAVASCULAR PRESSURE WIRE/FFR STUDY;  Surgeon: Nelva Bush, MD;  Location: [facility];  Service: Cardiovascular;  Laterality: N/A;  . LEFT HEART CATH AND CORONARY ANGIOGRAPHY N/A 03/18/2017   Procedure: Left Heart Cath and Coronary Angiography;  Surgeon: [name], MD;  Location: [facility];  Service: Cardiovascular;  Laterality: N/A;  . LEFT HEART CATH AND CORONARY ANGIOGRAPHY N/A 09/04/2019   Procedure: LEFT HEART CATH AND CORONARY ANGIOGRAPHY;  Surgeon: Nelva Bush, MD;  Location: [facility];  Service: Cardiovascular;  Laterality: N/A;  . REDUCTION MAMMAPLASTY Bilateral ~ 1976  . TONSILLECTOMY       Social History:  The patient  reports that she has never smoked. She has never used smokeless tobacco. She reports that she does not drink alcohol or use drugs.   Family History:  The patient's family history includes Diabetes in her mother, sister, sister, and sister; Heart attack in her father and mother; Heart disease in her mother;  Hyperlipidemia in her father and mother; Hypertension in her father and mother; Stomach cancer in her sister; Stroke in her father.    ROS:  Please see the history of present illness. All other systems are reviewed and  Negative to the above problem except as noted.    PHYSICAL EXAM: VS:  BP 116/60   Pulse 79   Ht 5\' 5"  (1.651 m)   Wt (!) 312 lb 3.2 oz (141.6 kg)   SpO2 98%   BMI 51.95 kg/m   GEN: Well nourished, well developed, in no acute distress  HEENT: normal  Neck: no JVD, carotid bruits, or masses Cardiac:  RRR; no murmurs, rubs, or gallops,no edema  Respiratory:  clear to auscultation bilaterally, normal work of breathing GI: soft, nontender, nondistended, + BS  No hepatomegaly  MS: no deformity Moving all extremities   Skin: warm and dry, no rash Neuro:  Strength and sensation are intact Psych: euthymic mood, full affect   EKG:  EKG is ordered today.   Lipid Panel    Component Value Date/Time   CHOL 163 09/17/2019 1017   TRIG 71 09/17/2019 1017   HDL 48 09/17/2019 1017   CHOLHDL 3.4 09/17/2019 1017   CHOLHDL 4.0 01/19/2019 0347   VLDL 10 01/19/2019 0347   LDLCALC 101 (H) 09/17/2019 1017      Wt Readings from Last 3 Encounters:  12/21/19 (!) 312 lb 3.2 oz (141.6 kg)  09/21/19 (!) 318 lb 12 oz (144.6 kg)  09/17/19 (!) 319 lb (144.7 kg)      ASSESSMENT AND PLAN:  1  CAD   Pt currently asymptomatic   Last cardiac cath in Oct 2020 with no flow limiting lesions   Continue risk factor modificaiton  2  HTN  Adequate control    3.  HL   LDL in October 2020 was 101  Now on Praluent   Will get lipids today    4  Wt loss>  Pt having difficult time with weight loss and glucose control   WIll refer back to [employer].   ? If meds need to be pulled back some so that she does not get drops in glucose.  Will defer any referral to dietary to [facility]    5  Renal  Pt has been seen in renal clinic   Will get records   F/U in 8 months    Current medicines are reviewed at  length with the patient today.  The patient does not have concerns regarding medicines.  Signed, [name], MD  12/21/2019 10:48 PM    [facility] [address], [place], [place]  [address] Phone: [phone]; Fax: [phone]

## 2019-12-21 ENCOUNTER — Ambulatory Visit: Payer: Medicare Other | Admitting: Internal Medicine

## 2019-12-21 ENCOUNTER — Encounter: Payer: Self-pay | Admitting: Internal Medicine

## 2019-12-21 ENCOUNTER — Other Ambulatory Visit: Payer: Self-pay

## 2019-12-21 ENCOUNTER — Other Ambulatory Visit: Payer: Medicare PPO | Admitting: *Deleted

## 2019-12-21 VITALS — BP 116/60 | HR 79 | Ht 65.0 in | Wt 312.2 lb

## 2019-12-21 DIAGNOSIS — E785 Hyperlipidemia, unspecified: Secondary | ICD-10-CM

## 2019-12-21 DIAGNOSIS — I10 Essential (primary) hypertension: Secondary | ICD-10-CM | POA: Diagnosis not present

## 2019-12-21 NOTE — Patient Instructions (Signed)
Medication Instructions:  No changes *If you need a refill on your cardiac medications before your next appointment, please call your pharmacy*  Lab Work: Today: lipids/liver and bmet If you have labs (blood work) drawn today and your tests are completely normal, you will receive your results only by: [name] MyChart Message (if you have MyChart) OR . A paper copy in the mail If you have any lab test that is abnormal or we need to change your treatment, we will call you to review the results.  Testing/Procedures: None orders  Follow-Up: At [facility], you and your health needs are our priority.  As part of our continuing mission to provide you with exceptional heart care, we have created designated Provider Care Teams.  These Care Teams include your primary Cardiologist (physician) and Advanced Practice Providers (APPs -  Physician Assistants and Nurse Practitioners) who all work together to provide you with the care you need, when you need it.  Your next appointment:   8 month(s) (September)  The format for your next appointment:   Either In Person or Virtual  Provider:   You may see [name], MD or one of the following Advanced Practice Providers on your designated Care Team:    Richardson Dopp, PA-C  [place], [place]  Daune Perch, NP   Other Instructions

## 2019-12-22 LAB — HEPATIC FUNCTION PANEL
ALT: 8 IU/L (ref 0–32)
AST: 15 IU/L (ref 0–40)
Albumin: 4.1 g/dL (ref 3.7–4.7)
Alkaline Phosphatase: 114 IU/L (ref 39–117)
Bilirubin Total: 0.4 mg/dL (ref 0.0–1.2)
Bilirubin, Direct: 0.14 mg/dL (ref 0.00–0.40)
Total Protein: 6 g/dL (ref 6.0–8.5)

## 2019-12-22 LAB — BASIC METABOLIC PANEL
BUN/Creatinine Ratio: 16 (ref 12–28)
BUN: 25 mg/dL (ref 8–27)
CO2: 24 mmol/L (ref 20–29)
Calcium: 9.4 mg/dL (ref 8.7–10.3)
Chloride: 105 mmol/L (ref 96–106)
Creatinine, Ser: 1.52 mg/dL — ABNORMAL HIGH (ref 0.57–1.00)
GFR calc Af Amer: 39 mL/min/{1.73_m2} — ABNORMAL LOW (ref >59)
GFR calc non Af Amer: 34 mL/min/{1.73_m2} — ABNORMAL LOW (ref >59)
Glucose: 155 mg/dL — ABNORMAL HIGH (ref 65–99)
Potassium: 4.7 mmol/L (ref 3.5–5.2)
Sodium: 142 mmol/L (ref 134–144)

## 2019-12-22 LAB — LIPID PANEL
Chol/HDL Ratio: 3.4 ratio (ref 0.0–4.4)
Cholesterol, Total: 149 mg/dL (ref 100–199)
HDL: 44 mg/dL (ref >39)
LDL Chol Calc (NIH): 92 mg/dL (ref 0–99)
Triglycerides: 64 mg/dL (ref 0–149)
VLDL Cholesterol Cal: 13 mg/dL (ref 5–40)

## 2019-12-23 ENCOUNTER — Telehealth: Payer: Self-pay | Admitting: Pharmacist

## 2019-12-23 DIAGNOSIS — I2510 Atherosclerotic heart disease of native coronary artery without angina pectoris: Secondary | ICD-10-CM

## 2019-12-23 DIAGNOSIS — I10 Essential (primary) hypertension: Secondary | ICD-10-CM

## 2019-12-23 MED ORDER — PRALUENT 75 MG/ML ~~LOC~~ SOAJ: 1.0000 "pen " | SUBCUTANEOUS | 11 refills | Status: DC

## 2019-12-23 NOTE — Telephone Encounter (Signed)
Called pt regarding lipids - she was started on Praluent in November 2020. LDL has not dropped as expected (101>>92). Called pt who states she took Praluent for 1 month, then reports the pharmacy would not fill it because her insurance would no longer cover it. She states her insurance changed from [facility] to [place]. Explained to pt that since she changed her insurance, we need to obtain a new prior authorization with her new insurance plan so that they would continue to cover it.  Prior auth submitted and approved. Called pharmacy who reports copay will be $64 for a 1 month supply. Pt confirms she is fine with this copay. She will continue atorvastatin and resume Praluent injections, will not need addition of Zetia at this time. Rescheduled follow up labs in March to assess efficacy.

## 2019-12-24 ENCOUNTER — Encounter: Payer: Self-pay | Admitting: Internal Medicine

## 2019-12-25 ENCOUNTER — Other Ambulatory Visit: Payer: Self-pay | Admitting: Internal Medicine

## 2019-12-27 ENCOUNTER — Other Ambulatory Visit: Payer: Self-pay | Admitting: Internal Medicine

## 2020-01-18 ENCOUNTER — Other Ambulatory Visit: Payer: Self-pay | Admitting: Internal Medicine

## 2020-01-18 DIAGNOSIS — E119 Type 2 diabetes mellitus without complications: Secondary | ICD-10-CM

## 2020-01-22 ENCOUNTER — Other Ambulatory Visit: Payer: Self-pay

## 2020-01-22 ENCOUNTER — Ambulatory Visit: Payer: Medicare PPO | Admitting: Internal Medicine

## 2020-01-22 ENCOUNTER — Encounter: Payer: Self-pay | Admitting: Internal Medicine

## 2020-01-22 VITALS — BP 142/84 | HR 64 | Temp 98.0°F | Wt 312.0 lb

## 2020-01-22 DIAGNOSIS — R040 Epistaxis: Secondary | ICD-10-CM | POA: Diagnosis not present

## 2020-01-22 NOTE — Patient Instructions (Signed)
Nosebleed, Adult A nosebleed is when blood comes out of the nose. Nosebleeds are common. Usually, they are not a sign of a serious condition. Nosebleeds can happen if a small blood vessel in your nose starts to bleed or if the lining of your nose (mucous membrane) cracks. They are commonly caused by:  Allergies.  Colds.  Picking your nose.  Blowing your nose too hard.  An injury from sticking an object into your nose or getting hit in the nose.  Dry or cold air. Less common causes of nosebleeds include:  Toxic fumes.  Something abnormal in the nose or in the air-filled spaces in the bones of the face (sinuses).  Growths in the nose, such as polyps.  Medicines or conditions that cause blood to clot slowly.  Certain illnesses or procedures that irritate or dry out the nasal passages. Follow these instructions at home: When you have a nosebleed:   Sit down and tilt your head slightly forward.  Use a clean towel or tissue to pinch your nostrils under the bony part of your nose. After 10 minutes, let go of your nose and see if bleeding starts again. Do not release pressure before that time. If there is still bleeding, repeat the pinching and holding for 10 minutes until the bleeding stops.  Do not place tissues or gauze in the nose to stop bleeding.  Avoid lying down and avoid tilting your head backward. That may make blood collect in the throat and cause gagging or coughing.  Use a nasal spray decongestant to help with a nosebleed as told by your health care provider.  Do not use petroleum jelly or mineral oil in your nose. It can drip into your lungs. After a nosebleed:  Avoid blowing your nose or sniffing for a number of hours.  Avoid straining, lifting, or bending at the waist for several days. You may resume other normal activities as you are able.  Use saline spray or a humidifier as told by your health care provider.  Aspirinand blood thinners make bleeding more  likely. If you are prescribed these medicines and you suffer from nosebleeds: ? Ask your health care provider if you should stop taking the medicines or if you should adjust the dose. ? Do not stop taking medicines that your health care provider has recommended unless told by your health care provider.  If your nosebleed was caused by dry mucous membranes, use over-the-counter saline nasal spray or gel. This will keep the mucous membranes moist and allow them to heal. If you must use a lubricant: ? Choose one that is water-soluble. ? Use only as much as you need and use it only as often as needed. ? Do not lie down until several hours after you use it. Contact a health care provider if:  You have a fever.  You get nosebleeds often or more often than usual.  You bruise very easily.  You have a nosebleed from having something stuck in your nose.  You have bleeding in your mouth.  You vomit or cough up brown material.  You have a nosebleed after you start a new medicine. Get help right away if:  You have a nosebleed after a fall or a head injury.  Your nosebleed does not go away after 20 minutes.  You feel dizzy or weak.  You have unusual bleeding from other parts of your body.  You have unusual bruising on other parts of your body.  You become sweaty.  You   vomit blood. This information is not intended to replace advice given to you by your health care provider. Make sure you discuss any questions you have with your health care provider. Document Revised: 02/04/2018 Document Reviewed: 05/22/2016 Elsevier Patient Education  2020 Elsevier Inc.  

## 2020-01-22 NOTE — Progress Notes (Signed)
Subjective:    Patient ID: Alicia [name], female    DOB: 10/24/1946, 74 y.o.   MRN: [id number]  HPI  Pt presents to the clinic today with c/o nose bleeds. She reports this has occurred 2 x in the last 2 weeks. She reports it starts at night and does not stop until a blood clot passes. She has a residual headache above her right eye after the nosebleed. She describes the pain as achy and feel like her right eye is weak. She reports associated dizziness, but denies visual changes, sensitivity to light, sound, nausea or vomiting. She denies runny nose, nasal congestion or post nasal drip. She does not use any nasal sprays. She is on Plavix.  Review of Systems      Past Medical History:  Diagnosis Date  . Anemia   . Arthritis    "right knee" (10/21/2015)  . CAD (coronary artery disease)    a. Cath 10/21/15: s/p DES to RCA: moderate diffuse stenosis of mod diagonal, diffuse irregularity LCx and LAD. 2D echo 10/21/15: mod LVH, EF 60-65%, no RWMA, calcified MV, mod-severe LAE. 03/18/17 PCI with DES--> 1 diag  . Cataract   . Essential hypertension   . Family history of adverse reaction to anesthesia    "oldest sister, [name], related to brain formation at back of head; when they put her to sleep it's hard to wake her up"  . GERD (gastroesophageal reflux disease)   . History of blood transfusion    "related to ruptured tubal pregnancy"  . Hyperlipidemia   . Hypertensive heart disease   . Morbid obesity ([place])   . Type II diabetes mellitus ([place])     Current Outpatient Medications  Medication Sig Dispense Refill  . Alirocumab (PRALUENT) 75 MG/ML SOAJ Inject 1 pen into the skin every 14 (fourteen) days. 2 pen 11  . amLODipine (NORVASC) 10 MG tablet Take 1 tablet (10 mg total) by mouth daily. 90 tablet 3  . aspirin 81 MG tablet Take 81 mg by mouth daily.    [name] atorvastatin (LIPITOR) 80 MG tablet Take 1 tablet (80 mg total) by mouth daily at 6 PM. 90 tablet 3  . carvedilol (COREG) 25 MG tablet  Take 2 tablets (50 mg total) by mouth 2 (two) times daily with a meal. 180 tablet 3  . clopidogrel (PLAVIX) 75 MG tablet Take 1 tablet (75 mg total) by mouth daily. 90 tablet 3  . doxylamine, Sleep, (UNISOM) 25 MG tablet Take 25 mg by mouth at bedtime.    . ferrous sulfate 325 (65 FE) MG tablet TAKE 1 TABLET (325 MG TOTAL) BY MOUTH DAILY. LAST REFILL SCHEDULE PHYSICAL 30 tablet 0  . furosemide (LASIX) 20 MG tablet TAKE 1 TABLET BY MOUTH EVERY DAY 90 tablet 3  . glipiZIDE (GLUCOTROL) 10 MG tablet Take 1 tablet (10 mg total) by mouth 2 (two) times daily before a meal. 60 tablet 2  . glucose blood (ONETOUCH ULTRA) test strip 1 each by Other route 2 (two) times daily. LAST REFILL SCHEDULE PHYSICAL 200 strip 0  . isosorbide mononitrate (IMDUR) 60 MG 24 hr tablet Take 1 tablet (60 mg total) by mouth daily. 90 tablet 3  . JANUVIA 100 MG tablet TAKE 1 TABLET (100 MG TOTAL) BY MOUTH DAILY. LAST REFILL SCHEDULE PHYSICAL 30 tablet 0  . losartan (COZAAR) 100 MG tablet Take 1 tablet (100 mg total) by mouth daily. 90 tablet 3  . metFORMIN (GLUCOPHAGE) 1000 MG tablet TAKE 1 TABLET (1,000  MG TOTAL) BY MOUTH 2 (TWO) TIMES DAILY WITH A MEAL. 180 tablet 2  . nitroGLYCERIN (NITROSTAT) 0.4 MG SL tablet Place 1 tablet (0.4 mg total) under the tongue every 5 (five) minutes as needed for chest pain. 25 tablet 3  . spironolactone (ALDACTONE) 25 MG tablet Take 1 tablet (25 mg total) by mouth daily. 90 tablet 3  . TRADJENTA 5 MG TABS tablet TAKE 1 TABLET (5 MG TOTAL) BY MOUTH DAILY. MUST SCHEDULE PHYSICAL 30 tablet 0   No current facility-administered medications for this visit.    Allergies  Allergen Reactions  . Iodinated Diagnostic Agents Hives  . Sulfa Antibiotics Hives    Family History  Problem Relation Age of Onset  . Diabetes Mother   . Heart attack Mother        2007  . Hypertension Mother   . Hyperlipidemia Mother   . Heart disease Mother   . Heart attack Father   . Stroke Father   . Hypertension  Father   . Hyperlipidemia Father   . Stomach cancer Sister   . Diabetes Sister   . Diabetes Sister   . Diabetes Sister   . Breast cancer Neg Hx     Social History   Socioeconomic History  . Marital status: Divorced    Spouse name: Not on file  . Number of children: 3  . Years of education: doctorate  . Highest education level: Not on file  Occupational History  . Occupation: Pharmacist, hospital  Tobacco Use  . Smoking status: Never Smoker  . Smokeless tobacco: Never Used  Substance and Sexual Activity  . Alcohol use: No  . Drug use: No  . Sexual activity: Not Currently  Other Topics Concern  . Not on file  Social History Narrative   Caffeine use-yes   Regular exercise-no   Social Determinants of Health   Financial Resource Strain:   . Difficulty of Paying Living Expenses: Not on file  Food Insecurity:   . Worried About Charity fundraiser in the Last Year: Not on file  . Ran Out of Food in the Last Year: Not on file  Transportation Needs:   . Lack of Transportation (Medical): Not on file  . Lack of Transportation (Non-Medical): Not on file  Physical Activity:   . Days of Exercise per Week: Not on file  . Minutes of Exercise per Session: Not on file  Stress:   . Feeling of Stress : Not on file  Social Connections:   . Frequency of Communication with Friends and Family: Not on file  . Frequency of Social Gatherings with Friends and Family: Not on file  . Attends Religious Services: Not on file  . Active Member of Clubs or Organizations: Not on file  . Attends Archivist Meetings: Not on file  . Marital Status: Not on file  Intimate Partner Violence:   . Fear of Current or Ex-Partner: Not on file  . Emotionally Abused: Not on file  . Physically Abused: Not on file  . Sexually Abused: Not on file     Constitutional: Pt reports headache. Denies fever, malaise, fatigue, or abrupt weight changes.  HEENT: Pt reports nose bleeds. Denies eye pain, eye redness, ear  pain, ringing in the ears, wax buildup, runny nose, nasal congestion, or sore throat. Respiratory: Denies difficulty breathing, shortness of breath, cough or sputum production.   Cardiovascular: Denies chest pain, chest tightness, palpitations or swelling in the hands or feet.  Neuro: Pt reports dizziness.  Denies difficulty with memory, balance or coordination.  No other specific complaints in a complete review of systems (except as listed in HPI above).  Objective:   Physical Exam BP (!) 142/84   Pulse 64   Temp 98 F (36.7 C) (Temporal)   Wt (!) 312 lb (141.5 kg)   SpO2 98%   BMI 51.92 kg/m   Wt Readings from Last 3 Encounters:  12/21/19 (!) 312 lb 3.2 oz (141.6 kg)  09/21/19 (!) 318 lb 12 oz (144.6 kg)  09/17/19 (!) 319 lb (144.7 kg)    General: Appears herstated age, obese, in NAD. HEENT: Head: normal shape and size, no sinus tenderness noted; Eyes: sclera white, no icterus, conjunctiva pink, PERRLA and EOMs intact; Nose: turbinates dry, enlarged;  Neck:  No adenopathy noted. Neurological: Alert and oriented.Coordination normal.    BME    Component Value Date/Time   NA 142 12/21/2019 1419   K 4.7 12/21/2019 1419   CL 105 12/21/2019 1419   CO2 24 12/21/2019 1419   GLUCOSE 155 (H) 12/21/2019 1419   GLUCOSE 150 (H) 01/20/2019 0529   BUN 25 12/21/2019 1419   CREATININE 1.52 (H) 12/21/2019 1419   CREATININE 1.55 (H) 08/10/2016 0850   CALCIUM 9.4 12/21/2019 1419   GFRNONAA 34 (L) 12/21/2019 1419   GFRAA 39 (L) 12/21/2019 1419    Lipid Panel     Component Value Date/Time   CHOL 149 12/21/2019 1419   TRIG 64 12/21/2019 1419   HDL 44 12/21/2019 1419   CHOLHDL 3.4 12/21/2019 1419   CHOLHDL 4.0 01/19/2019 0347   VLDL 10 01/19/2019 0347   LDLCALC 92 12/21/2019 1419    CBC    Component Value Date/Time   WBC 5.6 09/02/2019 1321   WBC 7.9 01/20/2019 0529   RBC 4.20 09/02/2019 1321   RBC 4.30 01/20/2019 0529   HGB 9.3 (L) 09/02/2019 1321   HCT 31.1 (L)  09/02/2019 1321   PLT 242 09/02/2019 1321   MCV 74 (L) 09/02/2019 1321   MCH 22.1 (L) 09/02/2019 1321   MCH 21.9 (L) 01/20/2019 0529   MCHC 29.9 (L) 09/02/2019 1321   MCHC 29.7 (L) 01/20/2019 0529   RDW 15.0 09/02/2019 1321   LYMPHSABS 2.1 01/18/2019 1820   MONOABS 0.5 01/18/2019 1820   EOSABS 0.1 01/18/2019 1820   BASOSABS 0.0 01/18/2019 1820    Hgb A1C Lab Results  Component Value Date   HGBA1C 7.7 (H) 01/19/2019             Assessment & Plan:   Epistaxis:  Likely due to dry nasal passages and being on Plavix Nasal Saline OTC 1 spray each nostril 2 x day If recurrent, will refer to ENT for further evaluation  Return precautions discussed   Webb Silversmith, NP This visit occurred during the SARS-CoV-2 public health emergency.  Safety protocols were in place, including screening questions prior to the visit, additional usage of staff PPE, and extensive cleaning of exam room while observing appropriate contact time as indicated for disinfecting solutions.

## 2020-01-26 ENCOUNTER — Other Ambulatory Visit: Payer: Self-pay | Admitting: Internal Medicine

## 2020-01-31 ENCOUNTER — Other Ambulatory Visit: Payer: Self-pay | Admitting: Internal Medicine

## 2020-02-08 ENCOUNTER — Other Ambulatory Visit: Payer: Medicare PPO | Admitting: *Deleted

## 2020-02-08 ENCOUNTER — Ambulatory Visit
Admission: RE | Admit: 2020-02-08 | Discharge: 2020-02-08 | Disposition: A | Discharge status: Home / Self Care | Payer: Medicare PPO | Source: Ambulatory Visit | Attending: Internal Medicine | Admitting: Internal Medicine

## 2020-02-08 ENCOUNTER — Other Ambulatory Visit: Payer: Self-pay

## 2020-02-08 ENCOUNTER — Ambulatory Visit: Payer: Medicare Other

## 2020-02-08 DIAGNOSIS — N6489 Other specified disorders of breast: Secondary | ICD-10-CM

## 2020-02-08 DIAGNOSIS — I2510 Atherosclerotic heart disease of native coronary artery without angina pectoris: Secondary | ICD-10-CM

## 2020-02-08 LAB — HEPATIC FUNCTION PANEL
ALT: 10 IU/L (ref 0–32)
AST: 16 IU/L (ref 0–40)
Albumin: 4.1 g/dL (ref 3.7–4.7)
Alkaline Phosphatase: 127 IU/L — ABNORMAL HIGH (ref 39–117)
Bilirubin Total: 0.3 mg/dL (ref 0.0–1.2)
Bilirubin, Direct: 0.14 mg/dL (ref 0.00–0.40)
Total Protein: 6.2 g/dL (ref 6.0–8.5)

## 2020-02-08 LAB — LIPID PANEL
Chol/HDL Ratio: 2.7 ratio (ref 0.0–4.4)
Cholesterol, Total: 108 mg/dL (ref 100–199)
HDL: 40 mg/dL (ref >39)
LDL Chol Calc (NIH): 52 mg/dL (ref 0–99)
Triglycerides: 76 mg/dL (ref 0–149)
VLDL Cholesterol Cal: 16 mg/dL (ref 5–40)

## 2020-02-10 ENCOUNTER — Other Ambulatory Visit: Payer: Self-pay | Admitting: Internal Medicine

## 2020-02-12 ENCOUNTER — Encounter: Payer: Self-pay | Admitting: Internal Medicine

## 2020-02-23 ENCOUNTER — Other Ambulatory Visit: Payer: Self-pay | Admitting: Internal Medicine

## 2020-02-25 DIAGNOSIS — Z006 Encounter for examination for normal comparison and control in clinical research program: Secondary | ICD-10-CM

## 2020-02-25 NOTE — Research (Signed)
Optimize Research Study 3 year follow up.  Patient doing well, no adverse events. Will continue to follow patient. Next visit will be between 15-Feb-2021 and 16-Apr-2021.   Current Outpatient Medications:  .  Alirocumab (PRALUENT) 75 MG/ML SOAJ, Inject 1 pen into the skin every 14 (fourteen) days., Disp: 2 pen, Rfl: 11 .  amLODipine (NORVASC) 10 MG tablet, Take 1 tablet (10 mg total) by mouth daily., Disp: 90 tablet, Rfl: 3 .  aspirin 81 MG tablet, Take 81 mg by mouth daily., Disp: , Rfl:  .  atorvastatin (LIPITOR) 80 MG tablet, Take 1 tablet (80 mg total) by mouth daily at 6 PM., Disp: 90 tablet, Rfl: 3 .  carvedilol (COREG) 25 MG tablet, Take 2 tablets (50 mg total) by mouth 2 (two) times daily with a meal., Disp: 180 tablet, Rfl: 3 .  clopidogrel (PLAVIX) 75 MG tablet, Take 1 tablet (75 mg total) by mouth daily., Disp: 90 tablet, Rfl: 3 .  doxylamine, Sleep, (UNISOM) 25 MG tablet, Take 25 mg by mouth at bedtime., Disp: , Rfl:  .  ferrous sulfate 325 (65 FE) MG tablet, TAKE 1 TABLET BY MOUTH DAILY. LAST REFILL SCHEDULE PHYSICAL, Disp: 30 tablet, Rfl: 0 .  furosemide (LASIX) 20 MG tablet, TAKE 1 TABLET BY MOUTH EVERY DAY, Disp: 90 tablet, Rfl: 3 .  glipiZIDE (GLUCOTROL) 10 MG tablet, Take 1 tablet (10 mg total) by mouth 2 (two) times daily before a meal., Disp: 60 tablet, Rfl: 2 .  glucose blood (ONETOUCH ULTRA) test strip, 1 each by Other route 2 (two) times daily. LAST REFILL SCHEDULE PHYSICAL, Disp: 200 strip, Rfl: 0 .  isosorbide mononitrate (IMDUR) 60 MG 24 hr tablet, Take 1 tablet (60 mg total) by mouth daily., Disp: 90 tablet, Rfl: 3 .  JANUVIA 100 MG tablet, TAKE 1 TABLET (100 MG TOTAL) BY MOUTH DAILY. LAST REFILL SCHEDULE PHYSICAL, Disp: 30 tablet, Rfl: 0 .  losartan (COZAAR) 100 MG tablet, Take 1 tablet (100 mg total) by mouth daily., Disp: 90 tablet, Rfl: 3 .  metFORMIN (GLUCOPHAGE) 1000 MG tablet, TAKE 1 TABLET (1,000 MG TOTAL) BY MOUTH 2 (TWO) TIMES DAILY WITH A MEAL., Disp: 180  tablet, Rfl: 2 .  nitroGLYCERIN (NITROSTAT) 0.4 MG SL tablet, Place 1 tablet (0.4 mg total) under the tongue every 5 (five) minutes as needed for chest pain., Disp: 25 tablet, Rfl: 3 .  spironolactone (ALDACTONE) 25 MG tablet, Take 1 tablet (25 mg total) by mouth daily., Disp: 90 tablet, Rfl: 3 .  TRADJENTA 5 MG TABS tablet, TAKE 1 TABLET (5 MG TOTAL) BY MOUTH DAILY. MUST SCHEDULE PHYSICAL, Disp: 30 tablet, Rfl: 0

## 2020-03-07 ENCOUNTER — Encounter: Payer: Self-pay | Admitting: Internal Medicine

## 2020-03-07 ENCOUNTER — Ambulatory Visit: Payer: Medicare PPO | Admitting: Internal Medicine

## 2020-03-07 ENCOUNTER — Other Ambulatory Visit: Payer: Self-pay

## 2020-03-07 VITALS — BP 136/78 | HR 77 | Temp 97.3°F | Wt 312.0 lb

## 2020-03-07 DIAGNOSIS — K219 Gastro-esophageal reflux disease without esophagitis: Secondary | ICD-10-CM

## 2020-03-07 DIAGNOSIS — R1313 Dysphagia, pharyngeal phase: Secondary | ICD-10-CM | POA: Diagnosis not present

## 2020-03-07 MED ORDER — OMEPRAZOLE 40 MG PO CPDR: 40.0000 mg | DELAYED_RELEASE_CAPSULE | Freq: Every day | ORAL | 2 refills | Status: DC

## 2020-03-07 NOTE — Progress Notes (Signed)
Subjective:    Patient ID: Alicia [name], female    DOB: 1946-01-31, 74 y.o.   MRN: [id number]  HPI  Pt presents to the clinic today with c/o difficulty swallowing, and feelings of being "choked" when she lays down. This started a while ago but has gotten worse in the last week and a half. She also notices these symptoms after eating. She denies post nasal drip, cough or SOB. She denies abdominal pain, nausea, vomiting, or constipation. She does have some loose stools associated with the Metformin. She has had reflux in the past but reports this feels different. She has not taken any medication OTC for this.   Review of Systems  Past Medical History:  Diagnosis Date  . Anemia   . Arthritis    "right knee" (10/21/2015)  . CAD (coronary artery disease)    a. Cath 10/21/15: s/p DES to RCA: moderate diffuse stenosis of mod diagonal, diffuse irregularity LCx and LAD. 2D echo 10/21/15: mod LVH, EF 60-65%, no RWMA, calcified MV, mod-severe LAE. 03/18/17 PCI with DES--> 1 diag  . Cataract   . Essential hypertension   . Family history of adverse reaction to anesthesia    "oldest sister, [name], related to brain formation at back of head; when they put her to sleep it's hard to wake her up"  . GERD (gastroesophageal reflux disease)   . History of blood transfusion    "related to ruptured tubal pregnancy"  . Hyperlipidemia   . Hypertensive heart disease   . Morbid obesity ([place])   . Type II diabetes mellitus ([place])     Current Outpatient Medications  Medication Sig Dispense Refill  . Alirocumab (PRALUENT) 75 MG/ML SOAJ Inject 1 pen into the skin every 14 (fourteen) days. 2 pen 11  . amLODipine (NORVASC) 10 MG tablet Take 1 tablet (10 mg total) by mouth daily. 90 tablet 3  . aspirin 81 MG tablet Take 81 mg by mouth daily.    [name] atorvastatin (LIPITOR) 80 MG tablet Take 1 tablet (80 mg total) by mouth daily at 6 PM. 90 tablet 3  . carvedilol (COREG) 25 MG tablet Take 2 tablets (50 mg total) by mouth  2 (two) times daily with a meal. 180 tablet 3  . clopidogrel (PLAVIX) 75 MG tablet Take 1 tablet (75 mg total) by mouth daily. 90 tablet 3  . doxylamine, Sleep, (UNISOM) 25 MG tablet Take 25 mg by mouth at bedtime.    . ferrous sulfate 325 (65 FE) MG tablet TAKE 1 TABLET BY MOUTH DAILY. LAST REFILL SCHEDULE PHYSICAL 30 tablet 0  . furosemide (LASIX) 20 MG tablet TAKE 1 TABLET BY MOUTH EVERY DAY 90 tablet 3  . glipiZIDE (GLUCOTROL) 10 MG tablet Take 1 tablet (10 mg total) by mouth 2 (two) times daily before a meal. 60 tablet 2  . glucose blood (ONETOUCH ULTRA) test strip 1 each by Other route 2 (two) times daily. LAST REFILL SCHEDULE PHYSICAL 200 strip 0  . isosorbide mononitrate (IMDUR) 60 MG 24 hr tablet Take 1 tablet (60 mg total) by mouth daily. 90 tablet 3  . JANUVIA 100 MG tablet TAKE 1 TABLET (100 MG TOTAL) BY MOUTH DAILY. LAST REFILL SCHEDULE PHYSICAL 30 tablet 0  . losartan (COZAAR) 100 MG tablet Take 1 tablet (100 mg total) by mouth daily. 90 tablet 3  . metFORMIN (GLUCOPHAGE) 1000 MG tablet TAKE 1 TABLET (1,000 MG TOTAL) BY MOUTH 2 (TWO) TIMES DAILY WITH A MEAL. 180 tablet 2  .  nitroGLYCERIN (NITROSTAT) 0.4 MG SL tablet Place 1 tablet (0.4 mg total) under the tongue every 5 (five) minutes as needed for chest pain. 25 tablet 3  . spironolactone (ALDACTONE) 25 MG tablet Take 1 tablet (25 mg total) by mouth daily. 90 tablet 3  . TRADJENTA 5 MG TABS tablet TAKE 1 TABLET (5 MG TOTAL) BY MOUTH DAILY. MUST SCHEDULE PHYSICAL 30 tablet 0   No current facility-administered medications for this visit.    Allergies  Allergen Reactions  . Iodinated Diagnostic Agents Hives  . Sulfa Antibiotics Hives    Family History  Problem Relation Age of Onset  . Diabetes Mother   . Heart attack Mother        2007  . Hypertension Mother   . Hyperlipidemia Mother   . Heart disease Mother   . Heart attack Father   . Stroke Father   . Hypertension Father   . Hyperlipidemia Father   . Stomach cancer  Sister   . Diabetes Sister   . Diabetes Sister   . Diabetes Sister   . Breast cancer Neg Hx     Social History   Socioeconomic History  . Marital status: Divorced    Spouse name: Not on file  . Number of children: 3  . Years of education: doctorate  . Highest education level: Not on file  Occupational History  . Occupation: Pharmacist, hospital  Tobacco Use  . Smoking status: Never Smoker  . Smokeless tobacco: Never Used  Substance and Sexual Activity  . Alcohol use: No  . Drug use: No  . Sexual activity: Not Currently  Other Topics Concern  . Not on file  Social History Narrative   Caffeine use-yes   Regular exercise-no   Social Determinants of Health   Financial Resource Strain:   . Difficulty of Paying Living Expenses:   Food Insecurity:   . Worried About Charity fundraiser in the Last Year:   . Arboriculturist in the Last Year:   Transportation Needs:   . Film/video editor (Medical):   [name] Lack of Transportation (Non-Medical):   Physical Activity:   . Days of Exercise per Week:   . Minutes of Exercise per Session:   Stress:   . Feeling of Stress :   Social Connections:   . Frequency of Communication with Friends and Family:   . Frequency of Social Gatherings with Friends and Family:   . Attends Religious Services:   . Active Member of Clubs or Organizations:   . Attends Archivist Meetings:   [name] Marital Status:   Intimate Partner Violence:   . Fear of Current or Ex-Partner:   . Emotionally Abused:   [name] Physically Abused:   . Sexually Abused:      Constitutional: Denies fever, malaise, fatigue, headache or abrupt weight changes.  HEENT: Denies eye pain, eye redness, ear pain, ringing in the ears, wax buildup, runny nose, nasal congestion, bloody nose, or sore throat. Respiratory: Denies difficulty breathing, shortness of breath, cough or sputum production.   Cardiovascular: Denies chest pain, chest tightness, palpitations or swelling in the hands or  feet.  Gastrointestinal: Pt reports difficulty swallowing. Denies abdominal pain, bloating, constipation, diarrhea or blood in the stool.   No other specific complaints in a complete review of systems (except as listed in HPI above).     Objective:   Physical Exam BP 136/78   Pulse 77   Temp (!) 97.3 F (36.3 C) (Temporal)  Wt (!) 312 lb (141.5 kg)   SpO2 98%   BMI 51.92 kg/m   Wt Readings from Last 3 Encounters:  01/22/20 (!) 312 lb (141.5 kg)  12/21/19 (!) 312 lb 3.2 oz (141.6 kg)  09/21/19 (!) 318 lb 12 oz (144.6 kg)    General: Appears her stated age, obese, in NAD. Cardiovascular: Normal rate and rhythm. S1,S2 noted.  No murmur, rubs or gallops noted.  Pulmonary/Chest: Normal effort and positive vesicular breath sounds. No respiratory distress. No wheezes, rales or ronchi noted.  Abdomen: Soft and nontender.  Neurological: Alert and oriented.   BMET    Component Value Date/Time   NA 142 12/21/2019 1419   K 4.7 12/21/2019 1419   CL 105 12/21/2019 1419   CO2 24 12/21/2019 1419   GLUCOSE 155 (H) 12/21/2019 1419   GLUCOSE 150 (H) 01/20/2019 0529   BUN 25 12/21/2019 1419   CREATININE 1.52 (H) 12/21/2019 1419   CREATININE 1.55 (H) 08/10/2016 0850   CALCIUM 9.4 12/21/2019 1419   GFRNONAA 34 (L) 12/21/2019 1419   GFRAA 39 (L) 12/21/2019 1419    Lipid Panel     Component Value Date/Time   CHOL 108 02/08/2020 0855   TRIG 76 02/08/2020 0855   HDL 40 02/08/2020 0855   CHOLHDL 2.7 02/08/2020 0855   CHOLHDL 4.0 01/19/2019 0347   VLDL 10 01/19/2019 0347   LDLCALC 52 02/08/2020 0855    CBC    Component Value Date/Time   WBC 5.6 09/02/2019 1321   WBC 7.9 01/20/2019 0529   RBC 4.20 09/02/2019 1321   RBC 4.30 01/20/2019 0529   HGB 9.3 (L) 09/02/2019 1321   HCT 31.1 (L) 09/02/2019 1321   PLT 242 09/02/2019 1321   MCV 74 (L) 09/02/2019 1321   MCH 22.1 (L) 09/02/2019 1321   MCH 21.9 (L) 01/20/2019 0529   MCHC 29.9 (L) 09/02/2019 1321   MCHC 29.7 (L) 01/20/2019  0529   RDW 15.0 09/02/2019 1321   LYMPHSABS 2.1 01/18/2019 1820   MONOABS 0.5 01/18/2019 1820   EOSABS 0.1 01/18/2019 1820   BASOSABS 0.0 01/18/2019 1820    Hgb A1C Lab Results  Component Value Date   HGBA1C 7.7 (H) 01/19/2019           Assessment & Plan:   Difficulty Swallowing, GERD:  RX for Omeprazole 40 mg daily in am Consider referral to GI for upper GI if no improvement.   Return precautions discussed Webb Silversmith, NP This visit occurred during the SARS-CoV-2 public health emergency.  Safety protocols were in place, including screening questions prior to the visit, additional usage of staff PPE, and extensive cleaning of exam room while observing appropriate contact time as indicated for disinfecting solutions.

## 2020-03-07 NOTE — Patient Instructions (Signed)
Dysphagia Eating Plan, Bite Size Food This diet plan is for people with moderate swallowing problems who have transitioned from pureed and minced foods. Bite size foods are soft and cut into small chunks so that they can be swallowed safely. On this eating plan, you may be instructed to drink liquids that are thickened. Work with your health care provider and your diet and nutrition specialist (dietitian) to make sure that you are following the diet safely and getting all the nutrients you need. What are tips for following this plan? General guidelines for foods   You may eat foods that are tender, soft, and moist.  Always test food texture before taking a bite. Poke food with a fork or spoon to make sure it is tender.  Food should be easy to cut and shew. Avoid large pieces of food that require a lot of chewing.  Take small bites. Each bite should be smaller than your thumb nail (about 15mm by 15 mm).  If you were on pureed and minced food diet plans, you may eat any of the foods included in those diets.  Avoid foods that are very dry, hard, sticky, chewy, coarse, or crunchy.  If instructed by your health care provider, thicken liquids. Follow your health care provider's instructions for what products to use, how to do this, and to what thickness. ? Your health care provider may recommend using a commercial thickener, rice cereal, or potato flakes. Ask your health care provider to recommend thickeners. ? Thickened liquids are usually a "pudding-like" consistency, or they may be as thick as honey or thick enough to eat with a spoon. Cooking  To moisten foods, you may add liquids while you are blending, mashing, or grinding your foods to the right consistency. These liquids include gravies, sauces, vegetable or fruit juice, milk, half and half, or water.  Strain extra liquid from foods before eating.  Reheat foods slowly to prevent a tough crust from forming.  Prepare foods in advance.  Meal planning  Eat a variety of foods to get all the nutrients you need.  Some foods may be tolerated better than others. Work with your dietitian to identify which foods are safest for you to eat.  Follow your meal plan as told by your dietitian. What foods are allowed? Grains Moist breads without nuts or seeds. Biscuits, muffins, pancakes, and waffles that are well-moistened with syrup, jelly, margarine, or butter. Cooked cereals. Moist bread stuffing. Moist rice. Well-moistened cold cereal with small chunks. Well-cooked pasta, noodles, rice, and bread dressing in small pieces and thick sauce. Soft dumplings or spaetzle in small pieces and butter or gravy. Vegetables Soft, well-cooked vegetables in small pieces. Soft-cooked, mashed potatoes. Thickened vegetable juice. Fruits Canned or cooked fruits that are soft or moist and do not have skin or seeds. Fresh, soft bananas. Thickened fruit juices. Meat and other protein foods Tender, moist meats or poultry in small pieces. Moist meatballs or meatloaf. Fish without bones. Eggs or egg substitutes in small pieces. Tofu. Tempeh and meat alternatives in small pieces. Well-cooked, tender beans, peas, baked beans, and other legumes. Dairy Thickened milk. Cream cheese. Yogurt. Cottage cheese. Sour cream. Small pieces of soft cheese. Fats and oils Butter. Oils. Margarine. Mayonnaise. Gravy. Spreads. Sweets and desserts Soft, smooth, moist desserts. Pudding. Custard. Moist cakes. Jam. Jelly. Honey. Preserves. Ask your health care provider whether you can have frozen desserts. Seasoning and other foods All seasonings and sweeteners. All sauces with small chunks. Prepared tuna, egg, or chicken   salad without raw fruits or vegetables. Moist casseroles with small, tender pieces of meat. Soups with tender meat. What foods are not allowed? Grains Coarse or dry cereals. Dry breads. Toast. Crackers. Tough, crusty breads, such as French bread and baguettes.  Dry pancakes, waffles, and muffins. Sticky rice. Dry bread stuffing. Granola. Popcorn. Chips. Vegetables All raw vegetables. Cooked corn. Rubbery or stiff cooked vegetables. Stringy vegetables, such as celery. Tough, crisp fried potatoes. Potato skins. Fruits Hard, crunchy, stringy, high-pulp, and juicy raw fruits such as apples, pineapple, papaya, and watermelon. Small, round fruits, such as grapes. Dried fruit and fruit leather. Meat and other protein foods Large pieces of meat. Dry, tough meats, such as bacon, sausage, and hot dogs. Chicken, turkey, or fish with skin and bones. Crunchy peanut butter. Nuts. Seeds. Nut and seed butters. Dairy Yogurt with nuts, seeds, or large chunks. Large chunks of cheese. Frozen desserts and milk consistency not allowed by your dietitian. Sweets and desserts Dry cakes. Chewy or dry cookies. Any desserts with nuts, seeds, dry fruits, coconut, pineapple, or anything dry, sticky, or hard. Chewy caramel. Licorice. Taffy-type candies. Ask your health care provider whether you can have frozen desserts. Seasoning and other foods Soups with tough or large chunks of meats, poultry, or vegetables. Corn or clam chowder. Smoothies with large chunks of fruit. Summary  Bite size foods can be helpful for people with moderate swallowing problems.  On the dysphagia eating plan, you may eat foods that are soft, moist, and cut into pieces smaller than 15mm by 15mm.  You may be instructed to thicken liquids. Follow your health care provider's instructions about how to do this and to what consistency. This information is not intended to replace advice given to you by your health care provider. Make sure you discuss any questions you have with your health care provider. Document Revised: 02/26/2019 Document Reviewed: 02/15/2017 [employer] Patient Education  2020 [employer]  

## 2020-03-11 ENCOUNTER — Other Ambulatory Visit: Payer: Self-pay | Admitting: Internal Medicine

## 2020-03-16 ENCOUNTER — Other Ambulatory Visit: Payer: Self-pay | Admitting: *Deleted

## 2020-03-16 DIAGNOSIS — R200 Anesthesia of skin: Secondary | ICD-10-CM

## 2020-03-20 ENCOUNTER — Other Ambulatory Visit: Payer: Self-pay | Admitting: Internal Medicine

## 2020-03-22 ENCOUNTER — Ambulatory Visit (INDEPENDENT_AMBULATORY_CARE_PROVIDER_SITE_OTHER): Payer: Medicare PPO | Admitting: Vascular Surgery

## 2020-03-22 ENCOUNTER — Ambulatory Visit (HOSPITAL_COMMUNITY)
Admission: RE | Admit: 2020-03-22 | Discharge: 2020-03-22 | Disposition: A | Discharge status: Home / Self Care | Payer: Medicare PPO | Source: Ambulatory Visit | Attending: Vascular Surgery | Admitting: Vascular Surgery

## 2020-03-22 ENCOUNTER — Encounter: Payer: Self-pay | Admitting: Vascular Surgery

## 2020-03-22 ENCOUNTER — Other Ambulatory Visit: Payer: Self-pay

## 2020-03-22 VITALS — BP 126/81 | HR 81 | Temp 97.2°F | Resp 20 | Ht 65.0 in | Wt 311.4 lb

## 2020-03-22 DIAGNOSIS — I739 Peripheral vascular disease, unspecified: Secondary | ICD-10-CM

## 2020-03-22 DIAGNOSIS — R200 Anesthesia of skin: Secondary | ICD-10-CM | POA: Diagnosis not present

## 2020-03-22 NOTE — Progress Notes (Signed)
[facility] [place]  Patient name: Alicia [name] MRN: [id number] DOB: 1946-04-13 Sex: female  REASON FOR CONSULT: Evaluation lower extremity numbness and discoloration  HPI: Alicia [name] is a 74 y.o. female, who is here today for evaluation of pain and numbness from her knees distally.  She reports that this is present constantly and is not related to walking.  She has no history of lower extremity tissue loss.  She has darkened discoloration in both lower extremities consistent with hemosiderin deposits.  She does have history of coronary artery disease.  History of renal insufficiency.  She is morbidly obese with a BMI of greater than 50.  Past Medical History:  Diagnosis Date  . Anemia   . Arthritis    "right knee" (10/21/2015)  . CAD (coronary artery disease)    a. Cath 10/21/15: s/p DES to RCA: moderate diffuse stenosis of mod diagonal, diffuse irregularity LCx and LAD. 2D echo 10/21/15: mod LVH, EF 60-65%, no RWMA, calcified MV, mod-severe LAE. 03/18/17 PCI with DES--> 1 diag  . Cataract   . Essential hypertension   . Family history of adverse reaction to anesthesia    "oldest sister, [name], related to brain formation at back of head; when they put her to sleep it's hard to wake her up"  . GERD (gastroesophageal reflux disease)   . History of blood transfusion    "related to ruptured tubal pregnancy"  . Hyperlipidemia   . Hypertensive heart disease   . Morbid obesity ([place])   . Type II diabetes mellitus (HCC)     Family History  Problem Relation Age of Onset  . Diabetes Mother   . Heart attack Mother        2007  . Hypertension Mother   . Hyperlipidemia Mother   . Heart disease Mother   . Heart attack Father   . Stroke Father   . Hypertension Father   . Hyperlipidemia Father   . Stomach cancer Sister   . Diabetes Sister   . Diabetes Sister   . Diabetes Sister   . Breast cancer Neg Hx     SOCIAL HISTORY:  Social History   Socioeconomic History  . Marital status: Divorced    Spouse name: Not on file  . Number of children: 3  . Years of education: doctorate  . Highest education level: Not on file  Occupational History  . Occupation: Pharmacist, hospital  Tobacco Use  . Smoking status: Never Smoker  . Smokeless tobacco: Never Used  Substance and Sexual Activity  . Alcohol use: No  . Drug use: No  . Sexual activity: Not Currently  Other Topics Concern  . Not on file  Social History Narrative   Caffeine use-yes   Regular exercise-no   Social Determinants of Health   Financial Resource Strain:   . Difficulty of Paying Living Expenses:   Food Insecurity:   . Worried About Charity fundraiser in the Last Year:   . Arboriculturist in the Last Year:   Transportation Needs:   . Film/video editor (Medical):   [name] Lack of Transportation (Non-Medical):   Physical Activity:   . Days of Exercise per Week:   . Minutes of Exercise per Session:   Stress:   . Feeling of Stress :   Social Connections:   . Frequency of Communication with Friends and Family:   . Frequency of Social Gatherings with Friends and Family:   . Attends Religious  Services:   . Active Member of Clubs or Organizations:   . Attends Archivist Meetings:   [name] Marital Status:   Intimate Partner Violence:   . Fear of Current or Ex-Partner:   . Emotionally Abused:   [name] Physically Abused:   . Sexually Abused:     Allergies  Allergen Reactions  . Iodinated Diagnostic Agents Hives  . Sulfa Antibiotics Hives    Current Outpatient Medications  Medication Sig Dispense Refill  . Alirocumab (PRALUENT) 75 MG/ML SOAJ Inject 1 pen into the skin every 14 (fourteen) days. 2 pen 11  . amLODipine (NORVASC) 10 MG tablet Take 1 tablet (10 mg total) by mouth daily. 90 tablet 3  . aspirin 81 MG tablet Take 81 mg by mouth daily.    [name] atorvastatin (LIPITOR) 80 MG tablet Take 1 tablet (80 mg total) by mouth daily at 6 PM. 90 tablet  3  . carvedilol (COREG) 25 MG tablet Take 2 tablets (50 mg total) by mouth 2 (two) times daily with a meal. 180 tablet 3  . clopidogrel (PLAVIX) 75 MG tablet Take 1 tablet (75 mg total) by mouth daily. 90 tablet 3  . doxylamine, Sleep, (UNISOM) 25 MG tablet Take 25 mg by mouth at bedtime.    . ferrous sulfate 325 (65 FE) MG tablet Take 1 tablet (325 mg total) by mouth daily with breakfast. 30 tablet 0  . furosemide (LASIX) 20 MG tablet TAKE 1 TABLET BY MOUTH EVERY DAY 90 tablet 3  . glipiZIDE (GLUCOTROL) 10 MG tablet Take 1 tablet (10 mg total) by mouth 2 (two) times daily before a meal. 60 tablet 2  . glucose blood (ONETOUCH ULTRA) test strip 1 each by Other route 2 (two) times daily. LAST REFILL SCHEDULE PHYSICAL 200 strip 0  . isosorbide mononitrate (IMDUR) 60 MG 24 hr tablet Take 1 tablet (60 mg total) by mouth daily. 90 tablet 3  . losartan (COZAAR) 100 MG tablet Take 1 tablet (100 mg total) by mouth daily. 90 tablet 3  . metFORMIN (GLUCOPHAGE) 1000 MG tablet TAKE 1 TABLET (1,000 MG TOTAL) BY MOUTH 2 (TWO) TIMES DAILY WITH A MEAL. 180 tablet 2  . nitroGLYCERIN (NITROSTAT) 0.4 MG SL tablet Place 1 tablet (0.4 mg total) under the tongue every 5 (five) minutes as needed for chest pain. 25 tablet 3  . omeprazole (PRILOSEC) 40 MG capsule Take 1 capsule (40 mg total) by mouth daily. 30 capsule 2  . sitaGLIPtin (JANUVIA) 100 MG tablet Take 1 tablet (100 mg total) by mouth daily. 30 tablet 0  . spironolactone (ALDACTONE) 25 MG tablet Take 1 tablet (25 mg total) by mouth daily. 90 tablet 3  . TRADJENTA 5 MG TABS tablet TAKE 1 TABLET (5 MG TOTAL) BY MOUTH DAILY. MUST SCHEDULE PHYSICAL 30 tablet 0   No current facility-administered medications for this visit.    REVIEW OF SYSTEMS:  [X]  denotes positive finding, [ ]  denotes negative finding Cardiac  Comments:  Chest pain or chest pressure:    Shortness of breath upon exertion: x   Short of breath when lying flat:    Irregular heart rhythm:         Vascular    Pain in calf, thigh, or hip brought on by ambulation:    Pain in feet at night that wakes you up from your sleep:  x   Blood clot in your veins:    Leg swelling:  x       Pulmonary  Oxygen at home:    Productive cough:     Wheezing:         Neurologic    Sudden weakness in arms or legs:     Sudden numbness in arms or legs:  x   Sudden onset of difficulty speaking or slurred speech:    Temporary loss of vision in one eye:     Problems with dizziness:         Gastrointestinal    Blood in stool:     Vomited blood:         Genitourinary    Burning when urinating:     Blood in urine:        Psychiatric    Major depression:         Hematologic    Bleeding problems:    Problems with blood clotting too easily:        Skin    Rashes or ulcers:        Constitutional    Fever or chills:      PHYSICAL EXAM: Vitals:   03/22/20 1353  BP: 126/81  Pulse: 81  Resp: 20  Temp: (!) 97.2 F (36.2 C)  TempSrc: Temporal  SpO2: 98%  Weight: (!) 311 lb 6.4 oz (141.3 kg)  Height: 5\' 5"  (1.651 m)    GENERAL: The patient is a well-nourished female, in no acute distress. The vital signs are documented above. CARDIOVASCULAR: 2+ radial pulses bilaterally.  1-2+ dorsalis pedis pulse bilaterally. PULMONARY: There is good air exchange  ABDOMEN: Soft and non-tender  MUSCULOSKELETAL: There are no major deformities or cyanosis. NEUROLOGIC: No focal weakness or paresthesias are detected. SKIN: There are no ulcers or rashes noted.  Thickening and hemosiderin deposits in the lower extremities PSYCHIATRIC: The patient has a normal affect.  DATA:  Normal ankle arm index and normal triphasic waveforms at the pedal level bilaterally lower extremities  She did have venous studies in 2 years ago which showed no evidence of DVT  MEDICAL ISSUES: Discussed this with the patient.  She has no evidence of arterial insufficiency.  Does have physical findings suggesting chronic venous  hypertension.  I reassured her that this is not limb threatening.  Discussed the importance of elevation when possible.  With her morbid obesity and respiratory status I doubt this will be possible.  She has been told not to wear compression because this may "cut off her circulation".  I explained that it would be preferable for her to wear knee-high compression when possible to control the swelling.  She was reassured with this discussion will see [place] again on an as-needed basis   Rosetta Posner, MD [facility] Tel [phone] Pager [phone]

## 2020-03-28 ENCOUNTER — Encounter: Payer: Self-pay | Admitting: Internal Medicine

## 2020-03-28 ENCOUNTER — Other Ambulatory Visit: Payer: Self-pay

## 2020-03-28 ENCOUNTER — Ambulatory Visit (INDEPENDENT_AMBULATORY_CARE_PROVIDER_SITE_OTHER): Payer: Medicare PPO | Admitting: Internal Medicine

## 2020-03-28 VITALS — BP 138/70 | HR 77 | Temp 98.0°F | Ht 65.0 in | Wt 313.0 lb

## 2020-03-28 DIAGNOSIS — Z1211 Encounter for screening for malignant neoplasm of colon: Secondary | ICD-10-CM

## 2020-03-28 DIAGNOSIS — K2100 Gastro-esophageal reflux disease with esophagitis, without bleeding: Secondary | ICD-10-CM | POA: Diagnosis not present

## 2020-03-28 DIAGNOSIS — D509 Iron deficiency anemia, unspecified: Secondary | ICD-10-CM | POA: Diagnosis not present

## 2020-03-28 DIAGNOSIS — E119 Type 2 diabetes mellitus without complications: Secondary | ICD-10-CM | POA: Diagnosis not present

## 2020-03-28 DIAGNOSIS — H6123 Impacted cerumen, bilateral: Secondary | ICD-10-CM

## 2020-03-28 DIAGNOSIS — I6300 Cerebral infarction due to thrombosis of unspecified precerebral artery: Secondary | ICD-10-CM

## 2020-03-28 DIAGNOSIS — I2510 Atherosclerotic heart disease of native coronary artery without angina pectoris: Secondary | ICD-10-CM

## 2020-03-28 DIAGNOSIS — E7800 Pure hypercholesterolemia, unspecified: Secondary | ICD-10-CM

## 2020-03-28 DIAGNOSIS — Z0000 Encounter for general adult medical examination without abnormal findings: Secondary | ICD-10-CM

## 2020-03-28 DIAGNOSIS — Z9861 Coronary angioplasty status: Secondary | ICD-10-CM

## 2020-03-28 DIAGNOSIS — I10 Essential (primary) hypertension: Secondary | ICD-10-CM

## 2020-03-28 DIAGNOSIS — G4733 Obstructive sleep apnea (adult) (pediatric): Secondary | ICD-10-CM

## 2020-03-28 DIAGNOSIS — Z780 Asymptomatic menopausal state: Secondary | ICD-10-CM

## 2020-03-28 DIAGNOSIS — M170 Bilateral primary osteoarthritis of knee: Secondary | ICD-10-CM

## 2020-03-28 DIAGNOSIS — I200 Unstable angina: Secondary | ICD-10-CM

## 2020-03-28 NOTE — Assessment & Plan Note (Signed)
A1C today No urine microalbumin secondary to ARB therapy Encouraged her to consume a low carb diet, exercise for weight loss Continue Metformin, Januvia, Glipizide and Tradjenta Foot exam today Encouraged yearly eye exam Immunizations UTD

## 2020-03-28 NOTE — Assessment & Plan Note (Signed)
Continue Omeprazole CBC and CMET today 

## 2020-03-28 NOTE — Addendum Note (Signed)
Addended by: Jearld Fenton on: 03/28/2020 04:17 PM   Modules accepted: Orders

## 2020-03-28 NOTE — Assessment & Plan Note (Signed)
Lipid profile reviewed CMET today Continue Atorvastatin, Praluent, ASA, Plavix, Carvedilol, Losartan, Furosemide, Amlodipine and Aldactone Encouraged low fat diet

## 2020-03-28 NOTE — Assessment & Plan Note (Signed)
Encouraged weight loss Continue Tylenol as needed

## 2020-03-28 NOTE — Progress Notes (Addendum)
HPI:  Pt presents to the clinic today for her annual subsequent Medicare Wellness Exam. She is also due to follow up chronic conditions.  OA: Mainly in her knees, complicated by weight. She takes Tylenol as needed with some relief of symptoms.  HLD with CAD: s/p stents. Her last LDL was 52, triglycerides 76, 01/2020. She is taking Atorvastatin, Praluent, ASA Plavix, Carvedilol and Imdur as prescribed. She follows with cardiology.  HTN: Her BP today is 138/70. She is taking Carvedilol, Losartan, Furosemide, Amlodipine and Aldactone as prescribed. ECG from 08/2019 reviewed.  GERD: Dysphagia improved. She denies any issues with reflux. She is taking Omeprazole as prescribed. There is no upper GI on file.  DM 2: Her last A1C was 7.7 %, 01/2019. She is taking Metformin, Glipizide, Januvia and Tradjenta as prescribed. She does not monitor her sugars. She checks her feet frequently. Her last eye exam was more than 1 year ago.  Hx of Stroke: No residual effect. She is taking Atorvastatin, ASA, Plavix, Praluent,  Carvedilol, Losartan, Amlodipine, Furosemide and Aldactone as prescribed. She is not following with neurology.  IDA: s/p gastric bypass. Her last H/H were 9.3/31.1, 08/2019. She is taking an oral iron supplement.  OSA: She does not wear a CPAP.  Past Medical History:  Diagnosis Date  . Anemia   . Arthritis    "right knee" (10/21/2015)  . CAD (coronary artery disease)    a. Cath 10/21/15: s/p DES to RCA: moderate diffuse stenosis of mod diagonal, diffuse irregularity LCx and LAD. 2D echo 10/21/15: mod LVH, EF 60-65%, no RWMA, calcified MV, mod-severe LAE. 03/18/17 PCI with DES--> 1 diag  . Cataract   . Essential hypertension   . Family history of adverse reaction to anesthesia    "oldest sister, [name], related to brain formation at back of head; when they put her to sleep it's hard to wake her up"  . GERD (gastroesophageal reflux disease)   . History of blood transfusion    "related to  ruptured tubal pregnancy"  . Hyperlipidemia   . Hypertensive heart disease   . Morbid obesity ([place])   . Type II diabetes mellitus ([place])     Current Outpatient Medications  Medication Sig Dispense Refill  . Alirocumab (PRALUENT) 75 MG/ML SOAJ Inject 1 pen into the skin every 14 (fourteen) days. 2 pen 11  . amLODipine (NORVASC) 10 MG tablet Take 1 tablet (10 mg total) by mouth daily. 90 tablet 3  . aspirin 81 MG tablet Take 81 mg by mouth daily.    [name] atorvastatin (LIPITOR) 80 MG tablet Take 1 tablet (80 mg total) by mouth daily at 6 PM. 90 tablet 3  . carvedilol (COREG) 25 MG tablet Take 2 tablets (50 mg total) by mouth 2 (two) times daily with a meal. 180 tablet 3  . clopidogrel (PLAVIX) 75 MG tablet Take 1 tablet (75 mg total) by mouth daily. 90 tablet 3  . doxylamine, Sleep, (UNISOM) 25 MG tablet Take 25 mg by mouth at bedtime.    . ferrous sulfate 325 (65 FE) MG tablet Take 1 tablet (325 mg total) by mouth daily with breakfast. 30 tablet 0  . furosemide (LASIX) 20 MG tablet TAKE 1 TABLET BY MOUTH EVERY DAY 90 tablet 3  . glipiZIDE (GLUCOTROL) 10 MG tablet Take 1 tablet (10 mg total) by mouth 2 (two) times daily before a meal. 60 tablet 2  . glucose blood (ONETOUCH ULTRA) test strip 1 each by Other route 2 (two) times daily. LAST  REFILL SCHEDULE PHYSICAL 200 strip 0  . isosorbide mononitrate (IMDUR) 60 MG 24 hr tablet Take 1 tablet (60 mg total) by mouth daily. 90 tablet 3  . losartan (COZAAR) 100 MG tablet Take 1 tablet (100 mg total) by mouth daily. 90 tablet 3  . metFORMIN (GLUCOPHAGE) 1000 MG tablet TAKE 1 TABLET (1,000 MG TOTAL) BY MOUTH 2 (TWO) TIMES DAILY WITH A MEAL. 180 tablet 2  . nitroGLYCERIN (NITROSTAT) 0.4 MG SL tablet Place 1 tablet (0.4 mg total) under the tongue every 5 (five) minutes as needed for chest pain. 25 tablet 3  . omeprazole (PRILOSEC) 40 MG capsule Take 1 capsule (40 mg total) by mouth daily. 30 capsule 2  . sitaGLIPtin (JANUVIA) 100 MG tablet Take 1 tablet (100  mg total) by mouth daily. 30 tablet 0  . spironolactone (ALDACTONE) 25 MG tablet Take 1 tablet (25 mg total) by mouth daily. 90 tablet 3  . TRADJENTA 5 MG TABS tablet TAKE 1 TABLET (5 MG TOTAL) BY MOUTH DAILY. MUST SCHEDULE PHYSICAL 30 tablet 0   No current facility-administered medications for this visit.    Allergies  Allergen Reactions  . Iodinated Diagnostic Agents Hives  . Sulfa Antibiotics Hives    Family History  Problem Relation Age of Onset  . Diabetes Mother   . Heart attack Mother        2007  . Hypertension Mother   . Hyperlipidemia Mother   . Heart disease Mother   . Heart attack Father   . Stroke Father   . Hypertension Father   . Hyperlipidemia Father   . Stomach cancer Sister   . Diabetes Sister   . Diabetes Sister   . Diabetes Sister   . Breast cancer Neg Hx     Social History   Socioeconomic History  . Marital status: Divorced    Spouse name: Not on file  . Number of children: 3  . Years of education: doctorate  . Highest education level: Not on file  Occupational History  . Occupation: Pharmacist, hospital  Tobacco Use  . Smoking status: Never Smoker  . Smokeless tobacco: Never Used  Substance and Sexual Activity  . Alcohol use: No  . Drug use: No  . Sexual activity: Not Currently  Other Topics Concern  . Not on file  Social History Narrative   Caffeine use-yes   Regular exercise-no   Social Determinants of Health   Financial Resource Strain:   . Difficulty of Paying Living Expenses:   Food Insecurity:   . Worried About Charity fundraiser in the Last Year:   . Arboriculturist in the Last Year:   Transportation Needs:   . Film/video editor (Medical):   [name] Lack of Transportation (Non-Medical):   Physical Activity:   . Days of Exercise per Week:   . Minutes of Exercise per Session:   Stress:   . Feeling of Stress :   Social Connections:   . Frequency of Communication with Friends and Family:   . Frequency of Social Gatherings with  Friends and Family:   . Attends Religious Services:   . Active Member of Clubs or Organizations:   . Attends Archivist Meetings:   [name] Marital Status:   Intimate Partner Violence:   . Fear of Current or Ex-Partner:   . Emotionally Abused:   [name] Physically Abused:   . Sexually Abused:     Hospitiliaztions: None  Health Maintenance:    Flu: 07/2016  Tetanus: 09/2012  Pneumovax: 09/2012  Prevnar: 09/2015  Covid: 12/2019, 01/2020  Zostavax: had it but unsure  Shingrix: never  Mammogram: 01/2020  Pap Smear: no longer screening  Bone Density: never  Colon Screening: 08/2011  Eye Doctor: annually  Dental Exam: as needed   Providers:   PCP: Webb Silversmith, NP-C  Cardiologist: Dr. Harrington Challenger  Nephrologist: Narda Amber Kidney     I have personally reviewed and have noted:  1. The patient's medical and social history 2. Their use of alcohol, tobacco or illicit drugs 3. Their current medications and supplements 4. The patient's functional ability including ADL's, fall risks, home safety risks and hearing or visual impairment. 5. Diet and physical activities 6. Evidence for depression or mood disorder  Subjective:   Review of Systems:   Constitutional: Denies fever, malaise, fatigue, headache or abrupt weight changes.  HEENT: Pt reports decreased hearing. Denies eye pain, eye redness, ear pain, ringing in the ears, wax buildup, runny nose, nasal congestion, bloody nose, or sore throat. Respiratory: Pt reports intermittent shortness of breath. Denies difficulty breathing, cough or sputum production.   Cardiovascular: Pt reports swelling in legs. Denies chest pain, chest tightness, palpitations or swelling in the hands.  Gastrointestinal: Denies abdominal pain, bloating, constipation, diarrhea or blood in the stool.  GU: Denies urgency, frequency, pain with urination, burning sensation, blood in urine, odor or discharge. Musculoskeletal: Pt reports joint pain in knees. Denies  decrease in range of motion, difficulty with gait, muscle pain or joint swelling.  Skin: Denies redness, rashes, lesions or ulcercations.  Neurological: Denies dizziness, difficulty with memory, difficulty with speech or problems with balance and coordination.  Psych: Denies anxiety, depression, SI/HI.  No other specific complaints in a complete review of systems (except as listed in HPI above).  Objective:  PE:   BP 138/70 (BP Location: Left Arm, Patient Position: Sitting, Cuff Size: Large)   Pulse 77   Temp 98 F (36.7 C) (Oral)   Ht 5\' 5"  (1.651 m)   Wt (!) 313 lb (142 kg)   SpO2 98%   BMI 52.09 kg/m   Wt Readings from Last 3 Encounters:  03/22/20 (!) 311 lb 6.4 oz (141.3 kg)  03/07/20 (!) 312 lb (141.5 kg)  01/22/20 (!) 312 lb (141.5 kg)    General: Appears her stated age, obese,  in NAD. Skin: Warm, dry and intact. No  ulcerations noted. HEENT: Head: normal shape and size; Eyes: sclera white, no icterus, conjunctiva pink, PERRLA and EOMs intact; Ears: bilateral cerumen impaction;   Neck: Neck supple, trachea midline. No masses, lumps or thyromegaly present.  Cardiovascular: Normal rate and rhythm. S1,S2 noted.  No murmur, rubs or gallops noted. Trace pitting BLE edema. No carotid bruits noted. Pulmonary/Chest: Normal effort and positive vesicular breath sounds. No respiratory distress. No wheezes, rales or ronchi noted.  Abdomen:  Normal bowel sounds.  Musculoskeletal: Normal range of motion. Strength 5/5 BUE/BLE.  Neurological: Alert and oriented. Cranial nerves II-XII grossly intact. Coordination normal.  Psychiatric: Mood and affect normal. Behavior is normal. Judgment and thought content normal.    BMET    Component Value Date/Time   NA 142 12/21/2019 1419   K 4.7 12/21/2019 1419   CL 105 12/21/2019 1419   CO2 24 12/21/2019 1419   GLUCOSE 155 (H) 12/21/2019 1419   GLUCOSE 150 (H) 01/20/2019 0529   BUN 25 12/21/2019 1419   CREATININE 1.52 (H) 12/21/2019 1419    CREATININE 1.55 (H) 08/10/2016 0850   CALCIUM 9.4 12/21/2019  1419   GFRNONAA 34 (L) 12/21/2019 1419   GFRAA 39 (L) 12/21/2019 1419    Lipid Panel     Component Value Date/Time   CHOL 108 02/08/2020 0855   TRIG 76 02/08/2020 0855   HDL 40 02/08/2020 0855   CHOLHDL 2.7 02/08/2020 0855   CHOLHDL 4.0 01/19/2019 0347   VLDL 10 01/19/2019 0347   LDLCALC 52 02/08/2020 0855    CBC    Component Value Date/Time   WBC 5.6 09/02/2019 1321   WBC 7.9 01/20/2019 0529   RBC 4.20 09/02/2019 1321   RBC 4.30 01/20/2019 0529   HGB 9.3 (L) 09/02/2019 1321   HCT 31.1 (L) 09/02/2019 1321   PLT 242 09/02/2019 1321   MCV 74 (L) 09/02/2019 1321   MCH 22.1 (L) 09/02/2019 1321   MCH 21.9 (L) 01/20/2019 0529   MCHC 29.9 (L) 09/02/2019 1321   MCHC 29.7 (L) 01/20/2019 0529   RDW 15.0 09/02/2019 1321   LYMPHSABS 2.1 01/18/2019 1820   MONOABS 0.5 01/18/2019 1820   EOSABS 0.1 01/18/2019 1820   BASOSABS 0.0 01/18/2019 1820    Hgb A1C Lab Results  Component Value Date   HGBA1C 7.7 (H) 01/19/2019      Assessment and Plan:   Medicare Annual Wellness Visit:  Diet: She does eat meat. She consumes fruits and veggies daily. She tries to avoid fried foods. She drinks mostly water. Physical activity: Sedentary Depression/mood screen: Negative, PHQ 9 score of 0 Hearing: Intact to whispered voice Visual acuity: Grossly normal, performs annual eye exam  ADLs: Capable Fall risk: None Home safety: Good Cognitive evaluation: Intact to orientation, naming, recall and repetition EOL planning: No adv directives, full code/ I agree  Preventative Medicine: Encouraged her to get a flu shot in the fall. Tetanus, pneumovax, prevnar and covid UTD. She is sure she had a Zostovax, not interested in shingrix at this time. Mammogram UTD. She declines cervical cancer screening. Referral to GI for screening colonoscopy. Bone density will be ordered0- she will have done with her mammogram next year. Encouraged her  to consume a balanced diet and exercise regimen Advised her to see an eye doctor and dentist annually. Will check CBC, CMET, A1C, IBC and Ferritin today. Due dates for screening exam given to pt as part of her AVS.  Bilateral Cerumen Impaction:  Manual lavage by CMA unsuccessful Will refer to ENT  Next appointment: 6 months, follow up chronic conditions   Webb Silversmith, NP This visit occurred during the SARS-CoV-2 public health emergency.  Safety protocols were in place, including screening questions prior to the visit, additional usage of staff PPE, and extensive cleaning of exam room while observing appropriate contact time as indicated for disinfecting solutions.

## 2020-03-28 NOTE — Assessment & Plan Note (Signed)
Continue Carvedilol, Losartan, Fursomide, Amlodipine and Aldactone Encouraged DASH diet CMET today

## 2020-03-28 NOTE — Assessment & Plan Note (Signed)
No CPAP Encouraged weight loss

## 2020-03-28 NOTE — Assessment & Plan Note (Signed)
Continue Atorvastatin, Praluent, ASA, Plavix, Carvedilol and Imdur

## 2020-03-28 NOTE — Assessment & Plan Note (Signed)
No residual effect Continue Atorvastatin, ASA, Plavix, Praluent, Carvedilol, Amlodipine, Furosemide and Aldactone

## 2020-03-28 NOTE — Assessment & Plan Note (Signed)
CBC, IBC and Ferritin today Continue oral iron

## 2020-03-28 NOTE — Assessment & Plan Note (Signed)
No recent chest pain Continue Carvedilol, ASA, Plavix

## 2020-03-28 NOTE — Patient Instructions (Signed)

## 2020-03-29 LAB — CBC WITH DIFFERENTIAL/PLATELET
Basophils Absolute: 0.1 10*3/uL (ref 0.0–0.1)
Basophils Relative: 1.1 % (ref 0.0–3.0)
Eosinophils Absolute: 0.2 10*3/uL (ref 0.0–0.7)
Eosinophils Relative: 2.7 % (ref 0.0–5.0)
HCT: 31.4 % — ABNORMAL LOW (ref 36.0–46.0)
Hemoglobin: 9.7 g/dL — ABNORMAL LOW (ref 12.0–15.0)
Lymphocytes Relative: 33.6 % (ref 12.0–46.0)
Lymphs Abs: 2 10*3/uL (ref 0.7–4.0)
MCHC: 31 g/dL (ref 30.0–36.0)
MCV: 69.7 fl — ABNORMAL LOW (ref 78.0–100.0)
Monocytes Absolute: 0.4 10*3/uL (ref 0.1–1.0)
Monocytes Relative: 7.6 % (ref 3.0–12.0)
Neutro Abs: 3.2 10*3/uL (ref 1.4–7.7)
Neutrophils Relative %: 55 % (ref 43.0–77.0)
Platelets: 250 10*3/uL (ref 150.0–400.0)
RBC: 4.5 Mil/uL (ref 3.87–5.11)
RDW: 17 % — ABNORMAL HIGH (ref 11.5–15.5)
WBC: 5.9 10*3/uL (ref 4.0–10.5)

## 2020-03-29 LAB — VITAMIN D 25 HYDROXY (VIT D DEFICIENCY, FRACTURES): VITD: 26.64 ng/mL — ABNORMAL LOW (ref 30.00–100.00)

## 2020-03-29 LAB — IBC PANEL
Iron: 59 ug/dL (ref 42–145)
Saturation Ratios: 20.3 % (ref 20.0–50.0)
Transferrin: 208 mg/dL — ABNORMAL LOW (ref 212.0–360.0)

## 2020-03-29 LAB — FERRITIN: Ferritin: 107 ng/mL (ref 10.0–291.0)

## 2020-03-29 LAB — HEMOGLOBIN A1C: Hgb A1c MFr Bld: 8.1 % — ABNORMAL HIGH (ref 4.6–6.5)

## 2020-03-30 ENCOUNTER — Encounter: Payer: Self-pay | Admitting: Internal Medicine

## 2020-03-30 MED ORDER — INSULIN GLARGINE 100 UNIT/ML SOLOSTAR PEN: 10.0000 [IU] | PEN_INJECTOR | Freq: Every day | SUBCUTANEOUS | 1 refills | Status: DC

## 2020-03-30 NOTE — Addendum Note (Signed)
Addended by: Jearld Fenton on: 03/30/2020 11:34 AM   Modules accepted: Orders

## 2020-03-31 MED ORDER — LINAGLIPTIN 5 MG PO TABS: ORAL_TABLET | ORAL | 2 refills | Status: DC

## 2020-03-31 MED ORDER — GLIPIZIDE 10 MG PO TABS: 10.0000 mg | ORAL_TABLET | Freq: Two times a day (BID) | ORAL | 2 refills | Status: DC

## 2020-04-05 ENCOUNTER — Encounter: Payer: Self-pay | Admitting: Internal Medicine

## 2020-04-05 ENCOUNTER — Other Ambulatory Visit: Payer: Self-pay

## 2020-04-05 ENCOUNTER — Ambulatory Visit (INDEPENDENT_AMBULATORY_CARE_PROVIDER_SITE_OTHER)
Admission: RE | Admit: 2020-04-05 | Discharge: 2020-04-05 | Disposition: A | Discharge status: Home / Self Care | Payer: Medicare PPO | Source: Ambulatory Visit | Attending: Internal Medicine | Admitting: Internal Medicine

## 2020-04-05 ENCOUNTER — Ambulatory Visit: Payer: Medicare PPO | Admitting: Internal Medicine

## 2020-04-05 VITALS — BP 136/78 | HR 73 | Temp 97.6°F | Wt 309.0 lb

## 2020-04-05 DIAGNOSIS — M79672 Pain in left foot: Secondary | ICD-10-CM

## 2020-04-05 MED ORDER — PREDNISONE 10 MG PO TABS: 10.0000 mg | ORAL_TABLET | Freq: Every day | ORAL | 0 refills | Status: DC

## 2020-04-05 NOTE — Patient Instructions (Signed)
Plantar Fasciitis Rehab Ask your health care provider which exercises are safe for you. Do exercises exactly as told by your health care provider and adjust them as directed. It is normal to feel mild stretching, pulling, tightness, or discomfort as you do these exercises. Stop right away if you feel sudden pain or your pain gets worse. Do not begin these exercises until told by your health care provider. Stretching and range-of-motion exercises These exercises warm up your muscles and joints and improve the movement and flexibility of your foot. These exercises also help to relieve pain. Plantar fascia stretch  1. Sit with your left / right leg crossed over your opposite knee. 2. Hold your heel with one hand with that thumb near your arch. With your other hand, hold your toes and gently pull them back toward the top of your foot. You should feel a stretch on the bottom of your toes or your foot (plantar fascia) or both. 3. Hold this stretch for__________ seconds. 4. Slowly release your toes and return to the starting position. Repeat __________ times. Complete this exercise __________ times a day. Gastrocnemius stretch, standing This exercise is also called a calf (gastroc) stretch. It stretches the muscles in the back of the upper calf. 1. Stand with your hands against a wall. 2. Extend your left / right leg behind you, and bend your front knee slightly. 3. Keeping your heels on the floor and your back knee straight, shift your weight toward the wall. Do not arch your back. You should feel a gentle stretch in your upper left / right calf. 4. Hold this position for __________ seconds. Repeat __________ times. Complete this exercise __________ times a day. Soleus stretch, standing This exercise is also called a calf (soleus) stretch. It stretches the muscles in the back of the lower calf. 1. Stand with your hands against a wall. 2. Extend your left / right leg behind you, and bend your front  knee slightly. 3. Keeping your heels on the floor, bend your back knee and shift your weight slightly over your back leg. You should feel a gentle stretch deep in your lower calf. 4. Hold this position for __________ seconds. Repeat __________ times. Complete this exercise __________ times a day. Gastroc and soleus stretch, standing step This exercise stretches the muscles in the back of the lower leg. These muscles are in the upper calf (gastrocnemius) and the lower calf (soleus). 1. Stand with the ball of your left / right foot on a step. The ball of your foot is on the walking surface, right under your toes. 2. Keep your other foot firmly on the same step. 3. Hold on to the wall or a railing for balance. 4. Slowly lift your other foot, allowing your body weight to press your left / right heel down over the edge of the step. You should feel a stretch in your left / right calf. 5. Hold this position for __________ seconds. 6. Return both feet to the step. 7. Repeat this exercise with a slight bend in your left / right knee. Repeat __________ times with your left / right knee straight and __________ times with your left / right knee bent. Complete this exercise __________ times a day. Balance exercise This exercise builds your balance and strength control of your arch to help take pressure off your plantar fascia. Single leg stand If this exercise is too easy, you can try it with your eyes closed or while standing on a pillow. 1.   Without shoes, stand near a railing or in a doorway. You may hold on to the railing or door frame as needed. 2. Stand on your left / right foot. Keep your big toe down on the floor and try to keep your arch lifted. Do not let your foot roll inward. 3. Hold this position for __________ seconds. Repeat __________ times. Complete this exercise __________ times a day. This information is not intended to replace advice given to you by your health care provider. Make sure  you discuss any questions you have with your health care provider. Document Revised: 02/26/2019 Document Reviewed: 09/03/2018 Elsevier Patient Education  2020 [employer]  

## 2020-04-05 NOTE — Progress Notes (Signed)
Subjective:    Patient ID: Alicia [name], female    DOB: 10/29/1946, 74 y.o.   MRN: [id number]  HPI  Pt presents to the clinic today with c/o left foot pain. This started 6 days ago. The pain starts in her left heel and radiates along the bottom of her foot and up her leg. She describes the pain as stabbing. The pain is worse with weight bearing. She denies numbness, tingling, weakness or swelling. She denies any injury to the area. She has a history of DM 2, chronic neuropathy. She has tried Tylenol with some relief.  Review of Systems  Past Medical History:  Diagnosis Date  . Anemia   . Arthritis    "right knee" (10/21/2015)  . CAD (coronary artery disease)    a. Cath 10/21/15: s/p DES to RCA: moderate diffuse stenosis of mod diagonal, diffuse irregularity LCx and LAD. 2D echo 10/21/15: mod LVH, EF 60-65%, no RWMA, calcified MV, mod-severe LAE. 03/18/17 PCI with DES--> 1 diag  . Cataract   . Essential hypertension   . Family history of adverse reaction to anesthesia    "oldest sister, [name], related to brain formation at back of head; when they put her to sleep it's hard to wake her up"  . GERD (gastroesophageal reflux disease)   . History of blood transfusion    "related to ruptured tubal pregnancy"  . Hyperlipidemia   . Hypertensive heart disease   . Morbid obesity ([place])   . Type II diabetes mellitus ([place])     Current Outpatient Medications  Medication Sig Dispense Refill  . Alirocumab (PRALUENT) 75 MG/ML SOAJ Inject 1 pen into the skin every 14 (fourteen) days. 2 pen 11  . amLODipine (NORVASC) 10 MG tablet Take 1 tablet (10 mg total) by mouth daily. 90 tablet 3  . aspirin 81 MG tablet Take 81 mg by mouth daily.    [name] atorvastatin (LIPITOR) 80 MG tablet Take 1 tablet (80 mg total) by mouth daily at 6 PM. 90 tablet 3  . carvedilol (COREG) 25 MG tablet Take 2 tablets (50 mg total) by mouth 2 (two) times daily with a meal. 180 tablet 3  . clopidogrel (PLAVIX) 75 MG tablet Take 1  tablet (75 mg total) by mouth daily. 90 tablet 3  . doxylamine, Sleep, (UNISOM) 25 MG tablet Take 25 mg by mouth at bedtime.    . ferrous sulfate 325 (65 FE) MG tablet Take 1 tablet (325 mg total) by mouth daily with breakfast. 30 tablet 0  . furosemide (LASIX) 20 MG tablet TAKE 1 TABLET BY MOUTH EVERY DAY 90 tablet 3  . glipiZIDE (GLUCOTROL) 10 MG tablet Take 1 tablet (10 mg total) by mouth 2 (two) times daily before a meal. 60 tablet 2  . glucose blood (ONETOUCH ULTRA) test strip 1 each by Other route 2 (two) times daily. LAST REFILL SCHEDULE PHYSICAL 200 strip 0  . isosorbide mononitrate (IMDUR) 60 MG 24 hr tablet Take 1 tablet (60 mg total) by mouth daily. 90 tablet 3  . linagliptin (TRADJENTA) 5 MG TABS tablet TAKE 1 TABLET (5 MG TOTAL) BY MOUTH DAILY. MUST SCHEDULE PHYSICAL 30 tablet 2  . losartan (COZAAR) 100 MG tablet Take 1 tablet (100 mg total) by mouth daily. 90 tablet 3  . metFORMIN (GLUCOPHAGE) 1000 MG tablet TAKE 1 TABLET (1,000 MG TOTAL) BY MOUTH 2 (TWO) TIMES DAILY WITH A MEAL. 180 tablet 2  . nitroGLYCERIN (NITROSTAT) 0.4 MG SL tablet Place 1 tablet (0.4  mg total) under the tongue every 5 (five) minutes as needed for chest pain. 25 tablet 3  . omeprazole (PRILOSEC) 40 MG capsule Take 1 capsule (40 mg total) by mouth daily. 30 capsule 2  . spironolactone (ALDACTONE) 25 MG tablet Take 1 tablet (25 mg total) by mouth daily. 90 tablet 3   No current facility-administered medications for this visit.    Allergies  Allergen Reactions  . Iodinated Diagnostic Agents Hives  . Sulfa Antibiotics Hives    Family History  Problem Relation Age of Onset  . Diabetes Mother   . Heart attack Mother        2007  . Hypertension Mother   . Hyperlipidemia Mother   . Heart disease Mother   . Heart attack Father   . Stroke Father   . Hypertension Father   . Hyperlipidemia Father   . Stomach cancer Sister   . Diabetes Sister   . Diabetes Sister   . Diabetes Sister   . Breast cancer Neg  Hx     Social History   Socioeconomic History  . Marital status: Divorced    Spouse name: Not on file  . Number of children: 3  . Years of education: doctorate  . Highest education level: Not on file  Occupational History  . Occupation: Pharmacist, hospital  Tobacco Use  . Smoking status: Never Smoker  . Smokeless tobacco: Never Used  Substance and Sexual Activity  . Alcohol use: No  . Drug use: No  . Sexual activity: Not Currently  Other Topics Concern  . Not on file  Social History Narrative   Caffeine use-yes   Regular exercise-no   Social Determinants of Health   Financial Resource Strain:   . Difficulty of Paying Living Expenses:   Food Insecurity:   . Worried About Charity fundraiser in the Last Year:   . Arboriculturist in the Last Year:   Transportation Needs:   . Film/video editor (Medical):   [name] Lack of Transportation (Non-Medical):   Physical Activity:   . Days of Exercise per Week:   . Minutes of Exercise per Session:   Stress:   . Feeling of Stress :   Social Connections:   . Frequency of Communication with Friends and Family:   . Frequency of Social Gatherings with Friends and Family:   . Attends Religious Services:   . Active Member of Clubs or Organizations:   . Attends Archivist Meetings:   [name] Marital Status:   Intimate Partner Violence:   . Fear of Current or Ex-Partner:   . Emotionally Abused:   [name] Physically Abused:   . Sexually Abused:      Constitutional: Denies fever, malaise, fatigue, headache or abrupt weight changes.  Respiratory: Denies difficulty breathing, shortness of breath, cough or sputum production.   Cardiovascular: Denies chest pain, chest tightness, palpitations or swelling in the hands or feet.  Musculoskeletal: Pt reports left foot pain, pain with weight bearing. Denies decrease in range of motion, muscle pain or joint swelling.  Skin: Denies redness, rashes, lesions or ulcercations.  Neurological: Pt reports  numbness and tingling in her feet (chronic). Denies dizziness, difficulty with memory, difficulty with speech or problems with balance and coordination.    No other specific complaints in a complete review of systems (except as listed in HPI above).     Objective:   Physical Exam  BP 136/78   Pulse 73   Temp 97.6 F (36.4 C) (  Temporal)   Wt (!) 309 lb (140.2 kg)   SpO2 98%   BMI 51.42 kg/m   Wt Readings from Last 3 Encounters:  03/28/20 (!) 313 lb (142 kg)  03/22/20 (!) 311 lb 6.4 oz (141.3 kg)  03/07/20 (!) 312 lb (141.5 kg)    General: Appears her stated age, obese, in NAD. Skin: Warm, dry and intact. No rashes, lesions or ulcerations noted. Cardiovascular: Normal rate. Pedal pulse 2+ on the left. Pulmonary/Chest: Normal effort. Musculoskeletal: Normal flexion, extension and rotation of the left ankle. No joint swelling noted. Pain with palpation of the left inferior and posterior heel. Limps with normal gait. Gait slow and steady without device.  Neurological: Alert and oriented. Sensation decreased to LLE, at baseline.  BMET    Component Value Date/Time   NA 142 12/21/2019 1419   K 4.7 12/21/2019 1419   CL 105 12/21/2019 1419   CO2 24 12/21/2019 1419   GLUCOSE 155 (H) 12/21/2019 1419   GLUCOSE 150 (H) 01/20/2019 0529   BUN 25 12/21/2019 1419   CREATININE 1.52 (H) 12/21/2019 1419   CREATININE 1.55 (H) 08/10/2016 0850   CALCIUM 9.4 12/21/2019 1419   GFRNONAA 34 (L) 12/21/2019 1419   GFRAA 39 (L) 12/21/2019 1419    Lipid Panel     Component Value Date/Time   CHOL 108 02/08/2020 0855   TRIG 76 02/08/2020 0855   HDL 40 02/08/2020 0855   CHOLHDL 2.7 02/08/2020 0855   CHOLHDL 4.0 01/19/2019 0347   VLDL 10 01/19/2019 0347   LDLCALC 52 02/08/2020 0855    CBC    Component Value Date/Time   WBC 5.9 03/28/2020 1618   RBC 4.50 03/28/2020 1618   HGB 9.7 (L) 03/28/2020 1618   HGB 9.3 (L) 09/02/2019 1321   HCT 31.4 (L) 03/28/2020 1618   HCT 31.1 (L) 09/02/2019  1321   PLT 250.0 03/28/2020 1618   PLT 242 09/02/2019 1321   MCV 69.7 Repeated and verified X2. (L) 03/28/2020 1618   MCV 74 (L) 09/02/2019 1321   MCH 22.1 (L) 09/02/2019 1321   MCH 21.9 (L) 01/20/2019 0529   MCHC 31.0 03/28/2020 1618   RDW 17.0 (H) 03/28/2020 1618   RDW 15.0 09/02/2019 1321   LYMPHSABS 2.0 03/28/2020 1618   MONOABS 0.4 03/28/2020 1618   EOSABS 0.2 03/28/2020 1618   BASOSABS 0.1 03/28/2020 1618    Hgb A1C Lab Results  Component Value Date   HGBA1C 8.1 (H) 03/28/2020            Assessment & Plan:  Intractable Left Heel Pain:  Likely plantar fasciitis Unable to take NSAID's secondary to decreased kidney function RX for Prednisone 10 mg daily x 5 days- monitor sugars Xray left heel today  Will follow up after xray, return precautions discussed  Webb Silversmith, NP This visit occurred during the SARS-CoV-2 public health emergency.  Safety protocols were in place, including screening questions prior to the visit, additional usage of staff PPE, and extensive cleaning of exam room while observing appropriate contact time as indicated for disinfecting solutions.

## 2020-04-12 ENCOUNTER — Other Ambulatory Visit: Payer: Self-pay | Admitting: Internal Medicine

## 2020-04-12 NOTE — Telephone Encounter (Signed)
It looks like Januvia was d/c at last OV, I did not see it mentioned in the lab results...[name] please advise if pt needs to continue along with other medications

## 2020-04-19 ENCOUNTER — Encounter: Payer: Self-pay | Admitting: *Deleted

## 2020-04-21 ENCOUNTER — Other Ambulatory Visit: Payer: Self-pay | Admitting: Internal Medicine

## 2020-04-28 ENCOUNTER — Other Ambulatory Visit: Payer: Self-pay | Admitting: Internal Medicine

## 2020-05-13 ENCOUNTER — Ambulatory Visit: Payer: Medicare PPO | Admitting: Internal Medicine

## 2020-05-13 ENCOUNTER — Telehealth: Payer: Self-pay | Admitting: *Deleted

## 2020-05-13 ENCOUNTER — Encounter: Payer: Self-pay | Admitting: Internal Medicine

## 2020-05-13 VITALS — BP 106/70 | HR 76 | Ht 63.25 in | Wt 310.1 lb

## 2020-05-13 DIAGNOSIS — K921 Melena: Secondary | ICD-10-CM | POA: Diagnosis not present

## 2020-05-13 DIAGNOSIS — K31819 Angiodysplasia of stomach and duodenum without bleeding: Secondary | ICD-10-CM

## 2020-05-13 DIAGNOSIS — Z86010 Personal history of colonic polyps: Secondary | ICD-10-CM

## 2020-05-13 DIAGNOSIS — R1310 Dysphagia, unspecified: Secondary | ICD-10-CM

## 2020-05-13 DIAGNOSIS — Z7902 Long term (current) use of antithrombotics/antiplatelets: Secondary | ICD-10-CM

## 2020-05-13 MED ORDER — PLENVU 140 G PO SOLR: 1.0000 | ORAL | 0 refills | Status: DC

## 2020-05-13 NOTE — Telephone Encounter (Signed)
Request for surgical clearance:     Endoscopy Procedure  What type of surgery is being performed?     Endoscopy, colonoscopy  When is this surgery scheduled?     07/18/20  What type of clearance is required ?   Pharmacy  Are there any medications that need to be held prior to surgery and how long? plavix  Practice name and name of physician performing surgery?      [facility]  What is your office phone and fax number?      Phone- [phone]  [phone]  Anesthesia type (None, local, MAC, general) ?       MAC

## 2020-05-13 NOTE — Progress Notes (Signed)
Patient ID: [name], female   DOB: August 10, 1946, 74 y.o.   MRN: [id number] HPI: Brad Mcgaughy is a 74 year old female with a past medical history of GERD, duodenal angiectasia, adenomatous colon polyp, CAD with prior PCI on Plavix, diabetes, hypertension, hyperlipidemia and obesity who presents for evaluation of possible melena and also colonoscopy for surveillance.  She is referred by Webb Silversmith, NP.  She reports that on the whole she is doing fairly well.  She does have intermittent issues with dysphagia symptom.  This is for both solid and liquid.  At times when she is swallowing she will get "choked" and also this sometimes promotes coughing.  She takes omeprazole 40 mg a day which was recently started by primary care and with this her symptoms have improved somewhat.  She has seen some darker than usual stool which is occurring intermittently.  She reports she is on chronic oral iron so stools are always dark but they have been somewhat darker of late.  Her bowel movements at times are urgent and loose but this is not new either.  She has not seen visible blood in her stool.  She has some occasional abdominal pain which is usually lower abdomen but this is not new or progressive.  Her family history is notable for a sister with a stomach cancer which may have been a submucosal lesion.  She had a prior upper endoscopy which I performed on 09/29/2012.  This revealed prior gastric surgery.  There was a small angiodysplastic lesion without bleeding in the second portion of the duodenum.  She had a colonoscopy which was performed by Dr. Earle Gell in December 2012.  This revealed a 3 mm sigmoid colon polyp which was removed and found to be adenomatous.  The exam was otherwise normal.  Past Medical History:  Diagnosis Date  . Anemia   . Arthritis    "right knee" (10/21/2015)  . CAD (coronary artery disease)    a. Cath 10/21/15: s/p DES to RCA: moderate diffuse stenosis of mod diagonal, diffuse  irregularity LCx and LAD. 2D echo 10/21/15: mod LVH, EF 60-65%, no RWMA, calcified MV, mod-severe LAE. 03/18/17 PCI with DES--> 1 diag  . Cataract   . Diabetes ([place])   . Essential hypertension   . Family history of adverse reaction to anesthesia    "oldest sister, [name], related to brain formation at back of head; when they put her to sleep it's hard to wake her up"  . GERD (gastroesophageal reflux disease)   . History of blood transfusion    "related to ruptured tubal pregnancy"  . Hyperlipidemia   . Hypertensive heart disease   . Morbid obesity ([place])   . Stroke (cerebrum) ([place])   . Tubular adenoma of colon   . Type II diabetes mellitus ([place])     Past Surgical History:  Procedure Laterality Date  . ABDOMINAL HYSTERECTOMY  1988  . APPENDECTOMY  1985  . [place] SURGERY  1984  . CARDIAC CATHETERIZATION N/A 10/21/2015   Procedure: Right/Left Heart Cath and Coronary Angiography;  Surgeon: [name], MD;  Location: [facility];  Service: Cardiovascular;  Laterality: N/A;  . CHOLECYSTECTOMY OPEN  1984  . CORONARY STENT INTERVENTION N/A 03/18/2017   Procedure: Coronary Stent Intervention;  Surgeon: [name], MD;  Location: [facility];  Service: Cardiovascular;  Laterality: N/A;  . CORONARY STENT PLACEMENT  03/18/2017   A OPTIMIZE STUDY Drug Eluting Stent (2.5 mm 18 mm - post-dilated to 2.7  mm) was successfully placed  . ECTOPIC PREGNANCY SURGERY    . INTRAVASCULAR PRESSURE WIRE/FFR STUDY N/A 09/04/2019   Procedure: INTRAVASCULAR PRESSURE WIRE/FFR STUDY;  Surgeon: Nelva Bush, MD;  Location: [facility];  Service: Cardiovascular;  Laterality: N/A;  . LEFT HEART CATH AND CORONARY ANGIOGRAPHY N/A 03/18/2017   Procedure: Left Heart Cath and Coronary Angiography;  Surgeon: [name], MD;  Location: [facility];  Service: Cardiovascular;  Laterality: N/A;  . LEFT HEART CATH AND CORONARY ANGIOGRAPHY N/A 09/04/2019   Procedure: LEFT HEART CATH AND  CORONARY ANGIOGRAPHY;  Surgeon: Nelva Bush, MD;  Location: [facility];  Service: Cardiovascular;  Laterality: N/A;  . REDUCTION MAMMAPLASTY Bilateral ~ 1976  . TONSILLECTOMY      Outpatient Medications Prior to Visit  Medication Sig Dispense Refill  . Alirocumab (PRALUENT) 75 MG/ML SOAJ Inject 1 pen into the skin every 14 (fourteen) days. 2 pen 11  . amLODipine (NORVASC) 10 MG tablet Take 1 tablet (10 mg total) by mouth daily. 90 tablet 3  . aspirin 81 MG tablet Take 81 mg by mouth daily.    [name] atorvastatin (LIPITOR) 80 MG tablet Take 1 tablet (80 mg total) by mouth daily at 6 PM. 90 tablet 3  . carvedilol (COREG) 25 MG tablet Take 2 tablets (50 mg total) by mouth 2 (two) times daily with a meal. 180 tablet 3  . clopidogrel (PLAVIX) 75 MG tablet Take 1 tablet (75 mg total) by mouth daily. 90 tablet 3  . doxylamine, Sleep, (UNISOM) 25 MG tablet Take 25 mg by mouth at bedtime.    . ferrous sulfate 325 (65 FE) MG tablet TAKE 1 TABLET BY MOUTH EVERY DAY WITH BREAKFAST 90 tablet 1  . furosemide (LASIX) 20 MG tablet TAKE 1 TABLET BY MOUTH EVERY DAY 90 tablet 3  . glipiZIDE (GLUCOTROL) 10 MG tablet Take 1 tablet (10 mg total) by mouth 2 (two) times daily before a meal. 60 tablet 2  . glucose blood (ONETOUCH ULTRA) test strip 1 each by Other route 2 (two) times daily. LAST REFILL SCHEDULE PHYSICAL 200 strip 0  . isosorbide mononitrate (IMDUR) 60 MG 24 hr tablet Take 1 tablet (60 mg total) by mouth daily. 90 tablet 3  . JANUVIA 100 MG tablet TAKE 1 TABLET BY MOUTH EVERY DAY 90 tablet 0  . linagliptin (TRADJENTA) 5 MG TABS tablet TAKE 1 TABLET (5 MG TOTAL) BY MOUTH DAILY. MUST SCHEDULE PHYSICAL 30 tablet 2  . losartan (COZAAR) 100 MG tablet Take 1 tablet (100 mg total) by mouth daily. 90 tablet 3  . metFORMIN (GLUCOPHAGE) 1000 MG tablet TAKE 1 TABLET (1,000 MG TOTAL) BY MOUTH 2 (TWO) TIMES DAILY WITH A MEAL. 180 tablet 2  . omeprazole (PRILOSEC) 40 MG capsule Take 1 capsule (40 mg total) by  mouth daily. 30 capsule 2  . predniSONE (DELTASONE) 10 MG tablet Take 1 tablet (10 mg total) by mouth daily with breakfast. 5 tablet 0  . spironolactone (ALDACTONE) 25 MG tablet Take 1 tablet (25 mg total) by mouth daily. 90 tablet 3  . nitroGLYCERIN (NITROSTAT) 0.4 MG SL tablet Place 1 tablet (0.4 mg total) under the tongue every 5 (five) minutes as needed for chest pain. (Patient not taking: Reported on 05/13/2020) 25 tablet 3   No facility-administered medications prior to visit.    Allergies  Allergen Reactions  . Iodinated Diagnostic Agents Hives  . Sulfa Antibiotics Hives    Family History  Problem Relation Age  of Onset  . Diabetes Mother   . Heart attack Mother 51       2007  . Hypertension Mother   . Hyperlipidemia Mother   . Heart disease Mother   . Heart attack Father   . Stroke Father 73  . Hypertension Father   . Hyperlipidemia Father   . Stomach cancer Sister        in the cavity behind the stomach  . Diabetes Sister   . Diabetes Sister   . Diabetes Sister   . Hypertension Son        weight loss corrected that  . Migraines Daughter        brain surgery  . Migraines Daughter   . Breast cancer Neg Hx     Social History   Tobacco Use  . Smoking status: Never Smoker  . Smokeless tobacco: Never Used  Vaping Use  . Vaping Use: Never used  Substance Use Topics  . Alcohol use: No  . Drug use: No    ROS: As per history of present illness, otherwise negative  BP 106/70 (BP Location: Left Wrist, Patient Position: Sitting, Cuff Size: Normal)   Pulse 76   Ht 5' 3.25" (1.607 m) Comment: height measured without shoes  Wt (!) 310 lb 2 oz (140.7 kg)   BMI 54.50 kg/m  Gen: awake, alert, NAD HEENT: anicteric CV: RRR, no mrg Pulm: CTA b/l Abd: soft, obese, NT/ND, +BS throughout Ext: no c/c/e Neuro: nonfocal   RELEVANT LABS AND IMAGING: CBC    Component Value Date/Time   WBC 5.9 03/28/2020 1618   RBC 4.50 03/28/2020 1618   HGB 9.7 (L) 03/28/2020 1618    HGB 9.3 (L) 09/02/2019 1321   HCT 31.4 (L) 03/28/2020 1618   HCT 31.1 (L) 09/02/2019 1321   PLT 250.0 03/28/2020 1618   PLT 242 09/02/2019 1321   MCV 69.7 Repeated and verified X2. (L) 03/28/2020 1618   MCV 74 (L) 09/02/2019 1321   MCH 22.1 (L) 09/02/2019 1321   MCH 21.9 (L) 01/20/2019 0529   MCHC 31.0 03/28/2020 1618   RDW 17.0 (H) 03/28/2020 1618   RDW 15.0 09/02/2019 1321   LYMPHSABS 2.0 03/28/2020 1618   MONOABS 0.4 03/28/2020 1618   EOSABS 0.2 03/28/2020 1618   BASOSABS 0.1 03/28/2020 1618    CMP     Component Value Date/Time   NA 142 12/21/2019 1419   K 4.7 12/21/2019 1419   CL 105 12/21/2019 1419   CO2 24 12/21/2019 1419   GLUCOSE 155 (H) 12/21/2019 1419   GLUCOSE 150 (H) 01/20/2019 0529   BUN 25 12/21/2019 1419   CREATININE 1.52 (H) 12/21/2019 1419   CREATININE 1.55 (H) 08/10/2016 0850   CALCIUM 9.4 12/21/2019 1419   PROT 6.2 02/08/2020 0855   ALBUMIN 4.1 02/08/2020 0855   AST 16 02/08/2020 0855   ALT 10 02/08/2020 0855   ALKPHOS 127 (H) 02/08/2020 0855   BILITOT 0.3 02/08/2020 0855   GFRNONAA 34 (L) 12/21/2019 1419   GFRAA 39 (L) 12/21/2019 1419   Iron/TIBC/Ferritin/ %Sat    Component Value Date/Time   IRON 59 03/28/2020 1618   TIBC 311 07/13/2017 1817   FERRITIN 107.0 03/28/2020 1618   IRONPCTSAT 20.3 03/28/2020 1618   IRONPCTSAT 13 11/04/2015 1040     ASSESSMENT/PLAN:  74 year old female with a past medical history of GERD, duodenal angiectasia, adenomatous colon polyp, CAD with prior PCI on Plavix, diabetes, hypertension, hyperlipidemia and obesity who presents for evaluation of possible melena and  also colonoscopy for surveillance.    1.  Possible melena/history of duodenal angiectasia --I have recommended that we repeat upper endoscopy with ablation of duodenal angiectasia if this is able to be found.  We will perform this in the outpatient hospital setting where APC is available.  We discussed the risk, benefits and alternatives and she is  agreeable and wishes to proceed.  She will need to hold Plavix 5 days before and we will reach out to Dr. Harrington Challenger to ensure this is acceptable.  2.  Dysphagia symptom --upper endoscopy as above.  It has improved with PPI, continue omeprazole 40 mg daily  3.  History of adenomatous colon polyp --overdue for colonoscopic surveillance of history of small adenoma.  I recommended colonoscopy which can be performed on the same day as upper endoscopy.  We discussed the risk, benefits and alternatives and she is agreeable and wishes to proceed.  4.  Chronic Plavix -- Hold Plavix 5 days before procedure - will instruct when and how to resume after procedure. Risks and benefits of procedure including bleeding, perforation, infection, missed lesions, medication reactions and possible hospitalization or surgery if complications occur explained. Additional rare but real risk of cardiovascular event such as heart attack or ischemia/infarct of other organs off Plavix explained and need to seek urgent help if this occurs. Will communicate by phone or EMR with patient's prescribing provider that to confirm holding Plavix is reasonable in this case.      ZS:[facility], Coralie Keens, [place],  [place] [id number]

## 2020-05-13 NOTE — Telephone Encounter (Signed)
Procedure is in 2 month, Dr. Harrington Challenger to review if ok to temporarily come off of plavix for 5 days prior to the low risk procedure. Last cath in 08/2019 showed stable anatomy, medical therapy was recommended. Last stent was placed on cath 03/18/2017.   I spoke with Mrs. Polanco, she says the only recent chest pain she had was when she tried to climb some stairs to reach the second floor in her home over a week ago. It was very mild and quickly went away. She did not require sublingual nitro. Otherwise, she does not do much exercise at all and says she probably over worked herself that day. Dr. Harrington Challenger, since her procedure is in 2 month, do you recommend a office visit prior to the Endoscopy?  Dr. Harrington Challenger, please send your response to [facility] PREOP  Thank you Isaac Laud

## 2020-05-13 NOTE — Patient Instructions (Signed)
You have been scheduled for an endoscopy and colonoscopy. Please follow the written instructions given to you at your visit today. Please pick up your prep supplies at the pharmacy within the next 1-3 days. If you use inhalers (even only as needed), please bring them with you on the day of your procedure. Your physician has requested that you go to www.startemmi.com and enter the access code given to you at your visit today. This web site gives a general overview about your procedure. However, you should still follow specific instructions given to you by our office regarding your preparation for the procedure.  Continue omeprazole 40 mg daily.  If you are age 43 or older, your body mass index should be between 23-30. Your Body mass index is 54.5 kg/m. If this is out of the aforementioned range listed, please consider follow up with your Primary Care Provider.  If you are age 20 or younger, your body mass index should be between 19-25. Your Body mass index is 54.5 kg/m. If this is out of the aformentioned range listed, please consider follow up with your Primary Care Provider.   Due to recent changes in healthcare laws, you may see the results of your imaging and laboratory studies on MyChart before your provider has had a chance to review them.  We understand that in some cases there may be results that are confusing or concerning to you. Not all laboratory results come back in the same time frame and the provider may be waiting for multiple results in order to interpret others.  Please give [place] 48 hours in order for your provider to thoroughly review all the results before contacting the office for clarification of your results.

## 2020-05-16 NOTE — Telephone Encounter (Signed)
Tried to reach the pt to go schedule her a pre op appt for her procedure on 07/18/20. No answer or vm came on, could not lmom to call and make appt.

## 2020-05-16 NOTE — Telephone Encounter (Signed)
° °  Primary Cardiologist: [name], MD  Chart reviewed as part of pre-operative protocol coverage. Appreciate commentary regarding Plavix. I do see Isaac Laud raised question of whether patient needs an office visit prior to procedure due to report of episode of chest discomfort with exertion. It seems to make most sense to arrange a formal visit especially since procedure is still quite far away.   Pre-op covering staff: - Please schedule appointment and call patient to inform them. - Please contact requesting surgeon's office via preferred method (i.e, phone, fax) to inform them of need for appointment prior to surgery.  Charlie Pitter, PA-C 05/16/2020, 9:48 AM

## 2020-05-16 NOTE — Telephone Encounter (Signed)
OK to hold plavix prior to procedure  Resume after

## 2020-05-17 NOTE — Telephone Encounter (Signed)
2nd attempt to reach pt re: appt for surgical clearance.  No answer / no voicemail.

## 2020-05-18 NOTE — Telephone Encounter (Signed)
Notified requesting provider  Call and made pre-op clearance for pt she will arrive early wearing a mask

## 2020-05-18 NOTE — Telephone Encounter (Signed)
Pt Cardiologist office called can not get hold of patient with number listed. Also can not give clearance pt needs to be seen.

## 2020-05-29 ENCOUNTER — Other Ambulatory Visit: Payer: Self-pay | Admitting: Physician Assistant

## 2020-05-30 NOTE — Telephone Encounter (Signed)
Refill request

## 2020-05-31 ENCOUNTER — Other Ambulatory Visit: Payer: Self-pay | Admitting: Internal Medicine

## 2020-05-31 NOTE — Progress Notes (Signed)
Cardiology Office Note   Date:  06/01/2020   ID:  Alicia [name], DOB 04/30/1946, MRN [id number]  PCP:  [name], Alicia W, NP  Cardiologist:  Dr.Ross  CC: Pre-Procedure Cardiac Evaluation    History of Present Illness: Alicia [name] is a 74 y.o. female who presents for preoperative evaluation and recommendations concerning temporary cessation of Plavix.  She is scheduled for an endoscopy procedure on 07/18/2020.  She is being followed by Dr. Ross in the setting of coronary artery disease status post DES to the RCA on 10/2015, repeat cath on 03/18/2017 for chest pain with DES to the diagonal 1, 45% mid LAD treated with p.o. BA, prior RCA stent was patent with normal LVEF.  Other history includes hypertension, hyperlipidemia, type 2 diabetes and syncope with CT MRI and carotid Dopplers normal.  A 30-day monitor showed normal sinus rhythm with no arrhythmias which was placed in the setting of a syncopal episode.  Is also documented in March 2020 the patient suffered a right brain CVA and was treated with TPA.  Echocardiogram did not reveal any source of emboli.  She was continued on Plavix and aspirin therapies.  She had a repeat cardiac catheterization in October 2020 which revealed moderate nonobstructive coronary artery disease.  The most severe lesions were found to be at 50 to 60% in the mid LAD and 50% ostial diagonal 1 which were not thought to be hemodynamically significant by DFR.  Her stents were noted to be patent and diagonal 1 and proximal RCA.  Her carvedilol was increased to 50 mg daily.  When last seen by Dr. Ross on 12/21/2019 her blood pressure was adequately controlled, she was noted to be on Praluent for hyperlipidemia, and was asymptomatic concerning angina.  She comes today without any cardiac complaints of chest discomfort, dyspnea on exertion, fatigue, dizziness, or presyncope.  She denies rapid heart rhythm or palpitations.  She denies any melena, hemoptysis, or epistaxis.  She is  medically compliant.  Past Medical History:  Diagnosis Date  . Anemia   . Arthritis    "right knee" (10/21/2015)  . CAD (coronary artery disease)    a. Cath 10/21/15: s/p DES to RCA: moderate diffuse stenosis of mod diagonal, diffuse irregularity LCx and LAD. 2D echo 10/21/15: mod LVH, EF 60-65%, no RWMA, calcified MV, mod-severe LAE. 03/18/17 PCI with DES--> 1 diag  . Cataract   . Diabetes (HCC)   . Essential hypertension   . Family history of adverse reaction to anesthesia    "oldest sister, Alicia [name], related to brain formation at back of head; when they put her to sleep it's hard to wake her up"  . GERD (gastroesophageal reflux disease)   . History of blood transfusion    "related to ruptured tubal pregnancy"  . Hyperlipidemia   . Hypertensive heart disease   . Morbid obesity (HCC)   . Stroke (cerebrum) (HCC)   . Tubular adenoma of colon   . Type II diabetes mellitus (HCC)     Past Surgical History:  Procedure Laterality Date  . ABDOMINAL HYSTERECTOMY  1988  . APPENDECTOMY  1985  . BARIATRIC SURGERY  1984  . CARDIAC CATHETERIZATION N/A 10/21/2015   Procedure: Right/Left Heart Cath and Coronary Angiography;  Surgeon: Michael Cooper, MD;  Location: [facility];  Service: Cardiovascular;  Laterality: N/A;  . CHOLECYSTECTOMY OPEN  1984  . CORONARY STENT INTERVENTION N/A 03/18/2017   Procedure: Coronary Stent Intervention;  Surgeon: David W Harding, MD;  Location: [facility]   INVASIVE CV [facility];  Service: Cardiovascular;  Laterality: N/A;  . CORONARY STENT PLACEMENT  03/18/2017   A OPTIMIZE STUDY Drug Eluting Stent (2.5 mm 18 mm - post-dilated to 2.7 mm) was successfully placed  . ECTOPIC PREGNANCY SURGERY    . INTRAVASCULAR PRESSURE WIRE/FFR STUDY N/A 09/04/2019   Procedure: INTRAVASCULAR PRESSURE WIRE/FFR STUDY;  Surgeon: Nelva Bush, MD;  Location: [facility];  Service: Cardiovascular;  Laterality: N/A;  . LEFT HEART CATH AND CORONARY ANGIOGRAPHY N/A 03/18/2017   Procedure:  Left Heart Cath and Coronary Angiography;  Surgeon: [name], MD;  Location: [facility];  Service: Cardiovascular;  Laterality: N/A;  . LEFT HEART CATH AND CORONARY ANGIOGRAPHY N/A 09/04/2019   Procedure: LEFT HEART CATH AND CORONARY ANGIOGRAPHY;  Surgeon: Nelva Bush, MD;  Location: [facility];  Service: Cardiovascular;  Laterality: N/A;  . REDUCTION MAMMAPLASTY Bilateral ~ 1976  . TONSILLECTOMY       Current Outpatient Medications  Medication Sig Dispense Refill  . Alirocumab (PRALUENT) 75 MG/ML SOAJ Inject 1 pen into the skin every 14 (fourteen) days. 2 pen 11  . amLODipine (NORVASC) 10 MG tablet Take 1 tablet (10 mg total) by mouth daily. 90 tablet 3  . aspirin 81 MG tablet Take 81 mg by mouth daily.    [name] atorvastatin (LIPITOR) 80 MG tablet Take 1 tablet (80 mg total) by mouth daily at 6 PM. 90 tablet 3  . carvedilol (COREG) 25 MG tablet Take 2 tablets (50 mg total) by mouth 2 (two) times daily with a meal. 120 tablet 6  . clopidogrel (PLAVIX) 75 MG tablet Take 1 tablet (75 mg total) by mouth daily. 90 tablet 3  . doxylamine, Sleep, (UNISOM) 25 MG tablet Take 25 mg by mouth at bedtime.    . ferrous sulfate 325 (65 FE) MG tablet TAKE 1 TABLET BY MOUTH EVERY DAY WITH BREAKFAST 90 tablet 1  . furosemide (LASIX) 20 MG tablet TAKE 1 TABLET BY MOUTH EVERY DAY 90 tablet 3  . glipiZIDE (GLUCOTROL) 10 MG tablet Take 1 tablet (10 mg total) by mouth 2 (two) times daily before a meal. 60 tablet 2  . glucose blood (ONETOUCH ULTRA) test strip 1 each by Other route 2 (two) times daily. LAST REFILL SCHEDULE PHYSICAL 200 strip 0  . isosorbide mononitrate (IMDUR) 60 MG 24 hr tablet Take 1 tablet (60 mg total) by mouth daily. 90 tablet 3  . JANUVIA 100 MG tablet TAKE 1 TABLET BY MOUTH EVERY DAY 90 tablet 0  . linagliptin (TRADJENTA) 5 MG TABS tablet TAKE 1 TABLET (5 MG TOTAL) BY MOUTH DAILY. MUST SCHEDULE PHYSICAL 30 tablet 2  . losartan (COZAAR) 100 MG tablet Take 1 tablet (100  mg total) by mouth daily. 90 tablet 3  . metFORMIN (GLUCOPHAGE) 1000 MG tablet TAKE 1 TABLET (1,000 MG TOTAL) BY MOUTH 2 (TWO) TIMES DAILY WITH A MEAL. 180 tablet 2  . omeprazole (PRILOSEC) 40 MG capsule Take 1 capsule (40 mg total) by mouth daily. 30 capsule 2  . PEG-KCl-NaCl-NaSulf-Na Asc-C (PLENVU) 140 g SOLR Take 1 kit by mouth as directed. Use coupon: BIN: [id number] PNC: CNRX Group: [id number] ID: [id number] 1 each 0  . predniSONE (DELTASONE) 10 MG tablet Take 1 tablet (10 mg total) by mouth daily with breakfast. 5 tablet 0  . spironolactone (ALDACTONE) 25 MG tablet Take 1 tablet (25 mg total) by mouth daily. 90 tablet 3  . nitroGLYCERIN (NITROSTAT) 0.4 MG SL tablet Place 1 tablet (  0.4 mg total) under the tongue every 5 (five) minutes as needed for chest pain. (Patient not taking: Reported on 06/01/2020) 25 tablet 3   No current facility-administered medications for this visit.    Allergies:   Iodinated diagnostic agents and Sulfa antibiotics    Social History:  The patient  reports that she has never smoked. She has never used smokeless tobacco. She reports that she does not drink alcohol and does not use drugs.   Family History:  The patient's family history includes Diabetes in her mother, sister, sister, and sister; Heart attack in her father; Heart attack (age of onset: 76) in her mother; Heart disease in her mother; Hyperlipidemia in her father and mother; Hypertension in her father, mother, and son; Migraines in her daughter and daughter; Stomach cancer in her sister; Stroke (age of onset: 57) in her father.    ROS: All other systems are reviewed and negative. Unless otherwise mentioned in H&P    PHYSICAL EXAM: VS:  BP 120/78   Pulse 91   Ht 5' 3" (1.6 m)   Wt (!) 311 lb 6.4 oz (141.3 kg)   SpO2 98%   BMI 55.16 kg/m  , BMI Body mass index is 55.16 kg/m. GEN: Well nourished, well developed, in no acute distress, morbidly obese HEENT: normal, xanthelasma bilateral eye lids.   Neck: no JVD, carotid bruits, or masses Cardiac: RRR; distant heart sounds, no murmurs, rubs, or gallops, mild bilateral dependent pretibial edema  Respiratory:  Clear to auscultation bilaterally, normal work of breathing GI: soft, nontender, nondistended, + BS MS: no deformity or atrophy Skin: warm and dry, no rash Neuro:  Strength and sensation are intact Psych: euthymic mood, full affect   EKG: Normal sinus rhythm, heart rate of 71 bpm.  (Personally reviewed)  Recent Labs: 12/21/2019: BUN 25; Creatinine, Ser 1.52; Potassium 4.7; Sodium 142 02/08/2020: ALT 10 03/28/2020: Hemoglobin 9.7; Platelets 250.0    Lipid Panel    Component Value Date/Time   CHOL 108 02/08/2020 0855   TRIG 76 02/08/2020 0855   HDL 40 02/08/2020 0855   CHOLHDL 2.7 02/08/2020 0855   CHOLHDL 4.0 01/19/2019 0347   VLDL 10 01/19/2019 0347   LDLCALC 52 02/08/2020 0855      Wt Readings from Last 3 Encounters:  06/01/20 (!) 311 lb 6.4 oz (141.3 kg)  05/13/20 (!) 310 lb 2 oz (140.7 kg)  04/05/20 (!) 309 lb (140.2 kg)      Other studies Reviewed: [facility] 09/04/2019 Conclusions: 1. Moderate, non-obstructive coronary artery disease.  Most severe lesions are 50-60% mid LAD and 50% ostial D1 stenoses, which are not hemodynamically significant by DFR. 2. Widely patent D1 and proximal RCA stents. 3. Normal left ventricular filling pressure.  Recommendations: 1. Optimize medical therapy; I will increase carvedilol to 50 mg BID. 2. Check BMP next week (09/07/2019).  Metformin should be held pending BMP, given CKD stage 3 and today's contrast exposure. 3. Aggressive secondary prevention.  Improved blood pressure control and weight loss would likely be beneficial.  Echocardiogram 01/19/2019 1. The left ventricle has normal systolic function with an ejection  fraction of 60-65%. The cavity size was normal. There is severely  increased left ventricular wall thickness. Left ventricular diastology  could not be  evaluated due to nondiagnostic  images.  2. The right ventricle has normal systolic function. The cavity was  normal. There is no increase in right ventricular wall thickness.  3. The mitral valve is normal in structure.  4. The   tricuspid valve is normal in structure.  5. The aortic valve is tricuspid Mild sclerosis of the aortic valve.  6. The pulmonic valve was normal in structure.  7. The inferior vena cava was dilated in size with >50% respiratory  variability.   ASSESSMENT AND PLAN:  1. Pre-Operative Cardiology Evaluation:   Chart reviewed as part of pre-operative protocol coverage. Given past medical history and time since last visit, based on ACC/AHA guidelines, Mrs Collazos would be at acceptable risk for the planned procedure without further cardiovascular testing. She is to hold Plavix and ASA on July 13, 2020 and restart on July 19, 2020.  She has been infirmed on this, and is also provided with written instructions.  She verbalizes understanding.  2.  Coronary artery disease: Status post DES in the RCA in 2016 with DES to the first diagonal, with POBA to the RCA stent in 02/2017.  She continues on dual antiplatelet therapy with instructions to hold as above for endoscopy.  She will continue secondary prevention.  She is without cardiac complaints today.  Continue isosorbide as directed.  3.  Hypertension: Blood pressures currently well controlled on amlodipine, losartan, and carvedilol.  No changes in current management.  4.  Non-insulin-dependent diabetes: Followed by PCP.   I will route this recommendation to the requesting party via Epic fax function and remove from pre-op pool.  Please call with questions.  Phill Myron. West Pugh, ANP, AACC  06/01/2020, 2:32 PM     Current medicines are reviewed at length with the patient today.  I have spent 25 minutes dedicated to the care of this patient on the date of this encounter to include pre-visit review of records,  assessment, management and diagnostic testing,with shared decision making.  Labs/ tests ordered today include: None  Phill Myron. West Pugh, ANP, AACC   06/01/2020 2:27 PM    [facility] [address] Office [phone] Fax [phone]  Notice: This dictation was prepared with Dragon dictation along with smaller phrase technology. Any transcriptional errors that result from this process are unintentional and may not be corrected upon review.

## 2020-06-01 ENCOUNTER — Ambulatory Visit: Payer: Medicare PPO | Admitting: Adult Health

## 2020-06-01 ENCOUNTER — Encounter: Payer: Self-pay | Admitting: Adult Health

## 2020-06-01 ENCOUNTER — Other Ambulatory Visit: Payer: Self-pay

## 2020-06-01 VITALS — BP 120/78 | HR 91 | Ht 63.0 in | Wt 311.4 lb

## 2020-06-01 DIAGNOSIS — Z01810 Encounter for preprocedural cardiovascular examination: Secondary | ICD-10-CM | POA: Diagnosis not present

## 2020-06-01 DIAGNOSIS — E785 Hyperlipidemia, unspecified: Secondary | ICD-10-CM

## 2020-06-01 DIAGNOSIS — E119 Type 2 diabetes mellitus without complications: Secondary | ICD-10-CM

## 2020-06-01 DIAGNOSIS — K219 Gastro-esophageal reflux disease without esophagitis: Secondary | ICD-10-CM

## 2020-06-01 DIAGNOSIS — E7800 Pure hypercholesterolemia, unspecified: Secondary | ICD-10-CM

## 2020-06-01 DIAGNOSIS — I2510 Atherosclerotic heart disease of native coronary artery without angina pectoris: Secondary | ICD-10-CM

## 2020-06-01 DIAGNOSIS — I10 Essential (primary) hypertension: Secondary | ICD-10-CM | POA: Diagnosis not present

## 2020-06-01 NOTE — Patient Instructions (Signed)
Medication Instructions:  HOLD ASPRIN 10 AND PLAVIX  (AUGUST25-31ST) *If you need a refill on your cardiac medications before your next appointment, please call your pharmacy*   Lab Work:   Testing/Procedures: NONE   Follow-Up: At [facility], you and your health needs are our priority.  As part of our continuing mission to provide you with exceptional heart care, we have created designated Provider Care Teams.  These Care Teams include your primary Cardiologist (physician) and Advanced Practice Providers (APPs -  Physician Assistants and Nurse Practitioners) who all work together to provide you with the care you need, when you need it.  We recommend signing up for the patient portal called "MyChart".  Sign up information is provided on this After Visit Summary.  MyChart is used to connect with patients for Virtual Visits (Telemedicine).  Patients are able to view lab/test results, encounter notes, upcoming appointments, etc.  Non-urgent messages can be sent to your provider as well.   To learn more about what you can do with MyChart, go to [email or web address].    Your next appointment:   3 month(s)  The format for your next appointment:   Either In Person or Virtual  Provider:      Other Instructions FOLLOW UP WITH DR. ROSS IN [place]

## 2020-06-03 NOTE — Telephone Encounter (Signed)
Excerpt from 06/01/20 cardiology note by Jory Sims, NP notes:  "1. Pre-Operative Cardiology Evaluation:   Chart reviewed as part of pre-operative protocol coverage. Given past medical history and time since last visit, based on ACC/AHA guidelines, Alicia [name] would be at acceptable risk for the planned procedure without further cardiovascular testing. She is to hold Plavix and ASA on July 13, 2020 and restart on July 19, 2020.  She has been infirmed on this, and is also provided with written instructions.  She verbalizes understanding."    I have attempted to reach patient to make sure she is aware for our documentation purposes but am unable to reach her. I will try again at a later time.

## 2020-06-06 NOTE — Telephone Encounter (Signed)
Left message with female for patient to call back. He indicates he will have her call.

## 2020-06-07 NOTE — Telephone Encounter (Signed)
I have spoken to patient and have confirmed that she is aware that she should hold Plavix 5 days prior to her upcoming procedure. She indicates cardiology also asked her to hold her aspirin.

## 2020-06-26 ENCOUNTER — Other Ambulatory Visit: Payer: Self-pay | Admitting: Internal Medicine

## 2020-07-04 ENCOUNTER — Ambulatory Visit: Payer: Medicare PPO | Admitting: Internal Medicine

## 2020-07-11 ENCOUNTER — Telehealth: Payer: Self-pay | Admitting: Internal Medicine

## 2020-07-11 NOTE — Telephone Encounter (Signed)
Patient calls stating she has not gotten any instructions on what to do for her blood thinners before her upcoming procedure on 07/18/20. I reminded her that we did speak on 06/07/20 where she was advised that she should how plavix 5 days prior to her upcoming procedure and she stated cardiology also told her to hold ASA 5 days prior to her procedure. (see 05/13/20 telephone note). It is also charted that cardiology gave her this same information on 06/01/20. Patient verbalizes understanding of this information.

## 2020-07-12 NOTE — Progress Notes (Signed)
Attempted to obtain medical history via telephone, unable to reach at this time, phone line rang busy signal x 2.

## 2020-07-14 ENCOUNTER — Other Ambulatory Visit (HOSPITAL_COMMUNITY)
Admission: RE | Admit: 2020-07-14 | Discharge: 2020-07-14 | Disposition: A | Discharge status: Home / Self Care | Payer: Medicare PPO | Source: Ambulatory Visit | Attending: Internal Medicine | Admitting: Internal Medicine

## 2020-07-14 DIAGNOSIS — Z01812 Encounter for preprocedural laboratory examination: Secondary | ICD-10-CM | POA: Diagnosis present

## 2020-07-14 DIAGNOSIS — Z20822 Contact with and (suspected) exposure to covid-19: Secondary | ICD-10-CM | POA: Diagnosis not present

## 2020-07-14 LAB — SARS CORONAVIRUS 2 (TAT 6-24 HRS): SARS Coronavirus 2: NEGATIVE

## 2020-07-17 NOTE — Anesthesia Preprocedure Evaluation (Addendum)
Anesthesia Evaluation  Patient identified by MRN, date of birth, ID band Patient awake    Reviewed: Allergy & Precautions, NPO status , Patient's Chart, lab work & pertinent test results  History of Anesthesia Complications (+) Family history of anesthesia reaction  Airway Mallampati: II  TM Distance: >3 FB     Dental   Pulmonary sleep apnea ,    breath sounds clear to auscultation       Cardiovascular hypertension, + angina + CAD   Rhythm:Regular Rate:Normal     Neuro/Psych CVA    GI/Hepatic Neg liver ROS, GERD  ,  Endo/Other  diabetes  Renal/GU negative Renal ROS     Musculoskeletal  (+) Arthritis ,   Abdominal   Peds  Hematology  (+) anemia ,   Anesthesia Other Findings   Reproductive/Obstetrics                            Anesthesia Physical Anesthesia Plan  ASA: III  Anesthesia Plan: MAC   Post-op Pain Management:    Induction: Intravenous  PONV Risk Score and Plan: 2  Airway Management Planned: Nasal Cannula and Simple Face Mask  Additional Equipment:   Intra-op Plan:   Post-operative Plan:   Informed Consent: I have reviewed the patients History and Physical, chart, labs and discussed the procedure including the risks, benefits and alternatives for the proposed anesthesia with the patient or authorized representative who has indicated his/her understanding and acceptance.       Plan Discussed with: Anesthesiologist  Anesthesia Plan Comments:         Anesthesia Quick Evaluation

## 2020-07-18 ENCOUNTER — Ambulatory Visit (HOSPITAL_COMMUNITY)
Admission: RE | Admit: 2020-07-18 | Discharge: 2020-07-18 | Disposition: A | Discharge status: Home / Self Care | Payer: Medicare PPO | Attending: Internal Medicine | Admitting: Internal Medicine

## 2020-07-18 ENCOUNTER — Ambulatory Visit (HOSPITAL_COMMUNITY): Payer: Medicare PPO | Admitting: Anesthesiology

## 2020-07-18 ENCOUNTER — Other Ambulatory Visit: Payer: Self-pay

## 2020-07-18 ENCOUNTER — Encounter (HOSPITAL_COMMUNITY)
Admission: RE | Disposition: A | Discharge status: Home / Self Care | Payer: Self-pay | Source: Home / Self Care | Attending: Internal Medicine

## 2020-07-18 ENCOUNTER — Encounter (HOSPITAL_COMMUNITY): Payer: Self-pay | Admitting: Internal Medicine

## 2020-07-18 DIAGNOSIS — I10 Essential (primary) hypertension: Secondary | ICD-10-CM | POA: Insufficient documentation

## 2020-07-18 DIAGNOSIS — Z955 Presence of coronary angioplasty implant and graft: Secondary | ICD-10-CM | POA: Diagnosis not present

## 2020-07-18 DIAGNOSIS — Z860100 Personal history of colon polyps, unspecified: Secondary | ICD-10-CM

## 2020-07-18 DIAGNOSIS — Z9884 Bariatric surgery status: Secondary | ICD-10-CM | POA: Insufficient documentation

## 2020-07-18 DIAGNOSIS — Z8673 Personal history of transient ischemic attack (TIA), and cerebral infarction without residual deficits: Secondary | ICD-10-CM | POA: Diagnosis not present

## 2020-07-18 DIAGNOSIS — Z1211 Encounter for screening for malignant neoplasm of colon: Secondary | ICD-10-CM | POA: Insufficient documentation

## 2020-07-18 DIAGNOSIS — R1310 Dysphagia, unspecified: Secondary | ICD-10-CM

## 2020-07-18 DIAGNOSIS — I2510 Atherosclerotic heart disease of native coronary artery without angina pectoris: Secondary | ICD-10-CM | POA: Insufficient documentation

## 2020-07-18 DIAGNOSIS — K5730 Diverticulosis of large intestine without perforation or abscess without bleeding: Secondary | ICD-10-CM | POA: Diagnosis not present

## 2020-07-18 DIAGNOSIS — K3189 Other diseases of stomach and duodenum: Secondary | ICD-10-CM

## 2020-07-18 DIAGNOSIS — K219 Gastro-esophageal reflux disease without esophagitis: Secondary | ICD-10-CM

## 2020-07-18 DIAGNOSIS — E119 Type 2 diabetes mellitus without complications: Secondary | ICD-10-CM | POA: Insufficient documentation

## 2020-07-18 DIAGNOSIS — Z6841 Body Mass Index (BMI) 40.0 and over, adult: Secondary | ICD-10-CM | POA: Insufficient documentation

## 2020-07-18 DIAGNOSIS — K2970 Gastritis, unspecified, without bleeding: Secondary | ICD-10-CM

## 2020-07-18 DIAGNOSIS — D124 Benign neoplasm of descending colon: Secondary | ICD-10-CM | POA: Diagnosis not present

## 2020-07-18 HISTORY — PX: BIOPSY: SHX5522

## 2020-07-18 HISTORY — PX: POLYPECTOMY: SHX5525

## 2020-07-18 HISTORY — PX: COLONOSCOPY WITH PROPOFOL: SHX5780

## 2020-07-18 HISTORY — PX: ESOPHAGOGASTRODUODENOSCOPY (EGD) WITH PROPOFOL: SHX5813

## 2020-07-18 LAB — POCT I-STAT, CHEM 8
BUN: 15 mg/dL (ref 8–23)
Calcium, Ion: 1.21 mmol/L (ref 1.15–1.40)
Chloride: 106 mmol/L (ref 98–111)
Creatinine, Ser: 2.5 mg/dL — ABNORMAL HIGH (ref 0.44–1.00)
Glucose, Bld: 140 mg/dL — ABNORMAL HIGH (ref 70–99)
HCT: 33 % — ABNORMAL LOW (ref 36.0–46.0)
Hemoglobin: 11.2 g/dL — ABNORMAL LOW (ref 12.0–15.0)
Potassium: 4.7 mmol/L (ref 3.5–5.1)
Sodium: 143 mmol/L (ref 135–145)
TCO2: 21 mmol/L — ABNORMAL LOW (ref 22–32)

## 2020-07-18 LAB — GLUCOSE, CAPILLARY: Glucose-Capillary: 122 mg/dL — ABNORMAL HIGH (ref 70–99)

## 2020-07-18 SURGERY — ESOPHAGOGASTRODUODENOSCOPY (EGD) WITH PROPOFOL
Anesthesia: Monitor Anesthesia Care

## 2020-07-18 MED ORDER — PROPOFOL 500 MG/50ML IV EMUL
INTRAVENOUS | Status: DC | PRN
  Administered 2020-07-18: 130 ug/kg/min via INTRAVENOUS

## 2020-07-18 MED ORDER — EPHEDRINE SULFATE-NACL 50-0.9 MG/10ML-% IV SOSY
PREFILLED_SYRINGE | INTRAVENOUS | Status: DC | PRN
  Administered 2020-07-18 (×3): 10 mg via INTRAVENOUS
  Administered 2020-07-18: 5 mg via INTRAVENOUS
  Administered 2020-07-18: 10 mg via INTRAVENOUS

## 2020-07-18 MED ORDER — PROPOFOL 1000 MG/100ML IV EMUL
INTRAVENOUS | Status: AC
  Filled 2020-07-18: qty 100

## 2020-07-18 MED ORDER — SODIUM CHLORIDE 0.9 % IV SOLN: INTRAVENOUS | Status: DC

## 2020-07-18 MED ORDER — PHENYLEPHRINE 40 MCG/ML (10ML) SYRINGE FOR IV PUSH (FOR BLOOD PRESSURE SUPPORT)
PREFILLED_SYRINGE | INTRAVENOUS | Status: DC | PRN
  Administered 2020-07-18 (×4): 80 ug via INTRAVENOUS

## 2020-07-18 MED ORDER — LACTATED RINGERS IV SOLN: INTRAVENOUS | Status: DC

## 2020-07-18 MED ORDER — PROPOFOL 10 MG/ML IV BOLUS
INTRAVENOUS | Status: DC | PRN
  Administered 2020-07-18: 20 mg via INTRAVENOUS

## 2020-07-18 SURGICAL SUPPLY — 25 items

## 2020-07-18 NOTE — Transfer of Care (Signed)
Immediate Anesthesia Transfer of Care Note  Patient: Alicia [name]  Procedure(s) Performed: ESOPHAGOGASTRODUODENOSCOPY (EGD) WITH PROPOFOL (N/A ) COLONOSCOPY WITH PROPOFOL (N/A ) BIOPSY POLYPECTOMY  Patient Location: PACU and Endoscopy Unit  Anesthesia Type:MAC  Level of Consciousness: awake, alert , oriented and patient cooperative  Airway & Oxygen Therapy: Patient Spontanous Breathing and Patient connected to face mask oxygen  Post-op Assessment: Report given to RN, Post -op Vital signs reviewed and stable and Patient moving all extremities  Post vital signs: Reviewed and stable  Last Vitals:  Vitals Value Taken Time  BP    Temp    Pulse 73 07/18/20 0904  Resp 23 07/18/20 0905  SpO2 100 % 07/18/20 0904  Vitals shown include unvalidated device data.  Last Pain:  Vitals:   07/18/20 0726  TempSrc: Oral  PainSc: 0-No pain         Complications: No complications documented.

## 2020-07-18 NOTE — Discharge Instructions (Signed)
YOU HAD AN ENDOSCOPIC PROCEDURE TODAY: Refer to the procedure report and other information in the discharge instructions given to you for any specific questions about what was found during the examination. If this information does not answer your questions, please call [place] office at [phone] to clarify.  ° °YOU SHOULD EXPECT: Some feelings of bloating in the abdomen. Passage of more gas than usual. Walking can help get rid of the air that was put into your GI tract during the procedure and reduce the bloating. If you had a lower endoscopy (such as a colonoscopy or flexible sigmoidoscopy) you may notice spotting of blood in your stool or on the toilet paper. Some abdominal soreness may be present for a day or two, also. ° °DIET: Your first meal following the procedure should be a light meal and then it is ok to progress to your normal diet. A half-sandwich or bowl of soup is an example of a good first meal. Heavy or fried foods are harder to digest and may make you feel nauseous or bloated. Drink plenty of fluids but you should avoid alcoholic beverages for 24 hours. If you had a esophageal dilation, please see attached instructions for diet.   ° °ACTIVITY: Your care partner should take you home directly after the procedure. You should plan to take it easy, moving slowly for the rest of the day. You can resume normal activity the day after the procedure however YOU SHOULD NOT DRIVE, use power tools, machinery or perform tasks that involve climbing or major physical exertion for 24 hours (because of the sedation medicines used during the test).  ° °SYMPTOMS TO REPORT IMMEDIATELY: °A gastroenterologist can be reached at any hour. Please call [phone]  for any of the following symptoms:  °Following lower endoscopy (colonoscopy, flexible sigmoidoscopy) °Excessive amounts of blood in the stool  °Significant tenderness, worsening of abdominal pains  °Swelling of the abdomen that is new, acute  °Fever of 100° or  higher  °Following upper endoscopy (EGD, EUS, ERCP, esophageal dilation) °Vomiting of blood or coffee ground material  °New, significant abdominal pain  °New, significant chest pain or pain under the shoulder blades  °Painful or persistently difficult swallowing  °New shortness of breath  °Black, tarry-looking or red, bloody stools ° °FOLLOW UP:  °If any biopsies were taken you will be contacted by phone or by letter within the next 1-3 weeks. Call [phone]  if you have not heard about the biopsies in 3 weeks.  °Please also call with any specific questions about appointments or follow up tests. ° °

## 2020-07-18 NOTE — Op Note (Signed)
[facility] Patient Name: Alicia [name] Procedure Date: 07/18/2020 MRN: [id number] Attending MD: [name] , MD Date of Birth: 01/05/1946 CSN: [id number] Age: 74 Admit Type: Outpatient Procedure:                Upper GI endoscopy Indications:              Dysphagia, Gastro-esophageal reflux disease, black                            stools, history of duodenal angioectasia Providers:                Lajuan Lines. Hilarie Fredrickson, MD, Benetta Spar RN, RN, Janee Morn, Technician Referring MD:             Jearld Fenton Medicines:                Monitored Anesthesia Care Complications:            No immediate complications. Estimated Blood Loss:     Estimated blood loss was minimal. Procedure:                Pre-Anesthesia Assessment:                           - Prior to the procedure, a History and Physical                            was performed, and patient medications and                            allergies were reviewed. The patient's tolerance of                            previous anesthesia was also reviewed. The risks                            and benefits of the procedure and the sedation                            options and risks were discussed with the patient.                            All questions were answered, and informed consent                            was obtained. Prior Anticoagulants: The patient has                            taken Plavix (clopidogrel), last dose was 5 days                            prior to procedure. ASA Grade Assessment: III - A  patient with severe systemic disease. After                            reviewing the risks and benefits, the patient was                            deemed in satisfactory condition to undergo the                            procedure.                           After obtaining informed consent, the endoscope was                            passed under  direct vision. Throughout the                            procedure, the patient's blood pressure, pulse, and                            oxygen saturations were monitored continuously. The                            [id number]-H190 ([id number] Olympus gastroscope was                            introduced through the mouth, and advanced to the                            third part of duodenum. The upper GI endoscopy was                            accomplished without difficulty. The patient                            tolerated the procedure well. Scope In: Scope Out: Findings:      The examined esophagus was normal. The Z-line is regular at 38 cm from       the incisors. The GE junction is rather patulous with no evidence of       stricture or stenosis in the entire esophagus.      Evidence of a gastric obesity procedure (not traditional anti-obesity       surgical anatomy) was found in the cardia and in the gastric fundus.       There is surgical restriction in the proximal stomach creating a small       pouch which is 3-4 cm in size. The mucosa in this area was characterized       by edema, erythema and friable mucosa. These changes are felt to be       caused by the restriction/surgical anatomy. Biopsies were taken with a       cold forceps for histology.      The gastric body and gastric antrum were normal. Biopsies were taken       with a cold forceps for histology and Helicobacter  pylori testing.      A single 15 mm submucosal nodule was found in the duodenal bulb.       Biopsies were taken with a cold forceps for histology.      The exam of the duodenum was otherwise normal. Impression:               - Normal esophagus.                           - Prior gastric obesity procedure with proximal                            gastric pouch was found, characterized by friable                            mucosa, edema and erythema. Biopsied.                           - Normal gastric body and antrum.  Biopsied.                           - Submucosal nodule found in the duodenum. Biopsied.                           - No evidence of angioectasias in the examined                            duodenum. Moderate Sedation:      N/A Recommendation:           - Patient has a contact number available for                            emergencies. The signs and symptoms of potential                            delayed complications were discussed with the                            patient. Return to normal activities tomorrow.                            Written discharge instructions were provided to the                            patient.                           - Resume previous diet.                           - Continue present medications.                           - Await pathology results.                           -  See the other procedure note for documentation of                            additional recommendations. Procedure Code(s):        --- Professional ---                           [phone], Esophagogastroduodenoscopy, flexible,                            transoral; with biopsy, single or multiple Diagnosis Code(s):        --- Professional ---                           Z98.84, Bariatric surgery status                           K31.89, Other diseases of stomach and duodenum                           R13.10, Dysphagia, unspecified                           K21.9, Gastro-esophageal reflux disease without                            esophagitis CPT copyright 2019 American [employer] Association. All rights reserved. The codes documented in this report are preliminary and upon coder review may  be revised to meet current compliance requirements. [name], MD 07/18/2020 9:25:10 AM This report has been signed electronically. Number of Addenda: 0

## 2020-07-18 NOTE — Anesthesia Postprocedure Evaluation (Signed)
Anesthesia Post Note  Patient: Alicia [name]  Procedure(s) Performed: ESOPHAGOGASTRODUODENOSCOPY (EGD) WITH PROPOFOL (N/A ) COLONOSCOPY WITH PROPOFOL (N/A ) BIOPSY POLYPECTOMY     Patient location during evaluation: PACU Anesthesia Type: MAC Level of consciousness: awake Pain management: pain level controlled Vital Signs Assessment: post-procedure vital signs reviewed and stable Respiratory status: spontaneous breathing Cardiovascular status: stable Postop Assessment: no apparent nausea or vomiting Anesthetic complications: no   No complications documented.  Last Vitals:  Vitals:   07/18/20 0910 07/18/20 0920  BP: (!) 86/39 (!) 90/38  Pulse: 68 66  Resp: 17 (!) 22  Temp:    SpO2: 100% 98%    Last Pain:  Vitals:   07/18/20 0920  TempSrc:   PainSc: 0-No pain                 Severn Goddard

## 2020-07-18 NOTE — H&P (Signed)
HPI: 75 yo with hx of GERD, duodenal avm, adenomatous colon polyps, cad on plavix, dm, htn, here for EGD and colon.  See office note dated 6/25/20201 for details.  No new complaints.  Past Medical History:  Diagnosis Date  . Anemia   . Arthritis    "right knee" (10/21/2015)  . CAD (coronary artery disease)    a. Cath 10/21/15: s/p DES to RCA: moderate diffuse stenosis of mod diagonal, diffuse irregularity LCx and LAD. 2D echo 10/21/15: mod LVH, EF 60-65%, no RWMA, calcified MV, mod-severe LAE. 03/18/17 PCI with DES--> 1 diag  . Cataract   . Diabetes ([place])   . Essential hypertension   . Family history of adverse reaction to anesthesia    "oldest sister, [name], related to brain formation at back of head; when they put her to sleep it's hard to wake her up"  . GERD (gastroesophageal reflux disease)   . History of blood transfusion    "related to ruptured tubal pregnancy"  . Hyperlipidemia   . Hypertensive heart disease   . Morbid obesity ([place])   . Stroke (cerebrum) ([place])   . Tubular adenoma of colon   . Type II diabetes mellitus ([place])     Past Surgical History:  Procedure Laterality Date  . ABDOMINAL HYSTERECTOMY  1988  . APPENDECTOMY  1985  . [place] SURGERY  1984  . CARDIAC CATHETERIZATION N/A 10/21/2015   Procedure: Right/Left Heart Cath and Coronary Angiography;  Surgeon: [name], MD;  Location: [facility];  Service: Cardiovascular;  Laterality: N/A;  . CHOLECYSTECTOMY OPEN  1984  . CORONARY STENT INTERVENTION N/A 03/18/2017   Procedure: Coronary Stent Intervention;  Surgeon: [name], MD;  Location: [facility];  Service: Cardiovascular;  Laterality: N/A;  . CORONARY STENT PLACEMENT  03/18/2017   A OPTIMIZE STUDY Drug Eluting Stent (2.5 mm 18 mm - post-dilated to 2.7 mm) was successfully placed  . ECTOPIC PREGNANCY SURGERY    . INTRAVASCULAR PRESSURE WIRE/FFR STUDY N/A 09/04/2019   Procedure: INTRAVASCULAR PRESSURE WIRE/FFR STUDY;  Surgeon:  Nelva Bush, MD;  Location: [facility];  Service: Cardiovascular;  Laterality: N/A;  . LEFT HEART CATH AND CORONARY ANGIOGRAPHY N/A 03/18/2017   Procedure: Left Heart Cath and Coronary Angiography;  Surgeon: [name], MD;  Location: [facility];  Service: Cardiovascular;  Laterality: N/A;  . LEFT HEART CATH AND CORONARY ANGIOGRAPHY N/A 09/04/2019   Procedure: LEFT HEART CATH AND CORONARY ANGIOGRAPHY;  Surgeon: Nelva Bush, MD;  Location: [facility];  Service: Cardiovascular;  Laterality: N/A;  . REDUCTION MAMMAPLASTY Bilateral ~ 1976  . TONSILLECTOMY      (Not in an outpatient encounter)   Allergies  Allergen Reactions  . Iodinated Diagnostic Agents Hives  . Sulfa Antibiotics Hives    Family History  Problem Relation Age of Onset  . Diabetes Mother   . Heart attack Mother 37       2007  . Hypertension Mother   . Hyperlipidemia Mother   . Heart disease Mother   . Heart attack Father   . Stroke Father 62  . Hypertension Father   . Hyperlipidemia Father   . Stomach cancer Sister        in the cavity behind the stomach  . Diabetes Sister   . Diabetes Sister   . Diabetes Sister   . Hypertension Son        weight loss corrected that  . Migraines Daughter  brain surgery  . Migraines Daughter   . Breast cancer Neg Hx     Social History   Tobacco Use  . Smoking status: Never Smoker  . Smokeless tobacco: Never Used  Vaping Use  . Vaping Use: Never used  Substance Use Topics  . Alcohol use: No  . Drug use: No    ROS: As per history of present illness, otherwise negative  BP (!) 101/57   Temp 98.2 F (36.8 C) (Oral)   Resp 17   Ht 5\' 5"  (1.651 m)   Wt 136.1 kg   SpO2 98%   BMI 49.92 kg/m  Gen: awake, alert, NAD HEENT: anicteric CV: RRR, no mrg Pulm: CTA b/l Abd: soft, NT/ND, +BS throughout Ext: no c/c/e Neuro: nonfocal   RELEVANT LABS AND IMAGING: CBC    Component Value Date/Time   WBC 5.9 03/28/2020 1618    RBC 4.50 03/28/2020 1618   HGB 9.7 (L) 03/28/2020 1618   HGB 9.3 (L) 09/02/2019 1321   HCT 31.4 (L) 03/28/2020 1618   HCT 31.1 (L) 09/02/2019 1321   PLT 250.0 03/28/2020 1618   PLT 242 09/02/2019 1321   MCV 69.7 Repeated and verified X2. (L) 03/28/2020 1618   MCV 74 (L) 09/02/2019 1321   MCH 22.1 (L) 09/02/2019 1321   MCH 21.9 (L) 01/20/2019 0529   MCHC 31.0 03/28/2020 1618   RDW 17.0 (H) 03/28/2020 1618   RDW 15.0 09/02/2019 1321   LYMPHSABS 2.0 03/28/2020 1618   MONOABS 0.4 03/28/2020 1618   EOSABS 0.2 03/28/2020 1618   BASOSABS 0.1 03/28/2020 1618    CMP     Component Value Date/Time   NA 142 12/21/2019 1419   K 4.7 12/21/2019 1419   CL 105 12/21/2019 1419   CO2 24 12/21/2019 1419   GLUCOSE 155 (H) 12/21/2019 1419   GLUCOSE 150 (H) 01/20/2019 0529   BUN 25 12/21/2019 1419   CREATININE 1.52 (H) 12/21/2019 1419   CREATININE 1.55 (H) 08/10/2016 0850   CALCIUM 9.4 12/21/2019 1419   PROT 6.2 02/08/2020 0855   ALBUMIN 4.1 02/08/2020 0855   AST 16 02/08/2020 0855   ALT 10 02/08/2020 0855   ALKPHOS 127 (H) 02/08/2020 0855   BILITOT 0.3 02/08/2020 0855   GFRNONAA 34 (L) 12/21/2019 1419   GFRAA 39 (L) 12/21/2019 1419    ASSESSMENT/PLAN: 74 yo with hx of GERD, duodenal avm, adenomatous colon polyps, cad on plavix, dm, htn, here for EGD and colon.  1. Hx of possible melena in the setting of duodenal angioectasia hx, dysphagia symptom improved with PPI -- EGD today.  2. Hx of adenomatous colon polyps -- colonoscopy today  Plavix on hold  The nature of the procedure, as well as the risks, benefits, and alternatives were carefully and thoroughly reviewed with the patient. Ample time for discussion and questions allowed. The patient understood, was satisfied, and agreed to proceed.       Cc:No referring provider defined for this encounter.

## 2020-07-18 NOTE — Op Note (Signed)
[facility] Patient Name: Alicia [name] Procedure Date: 07/18/2020 MRN: [id number] Attending MD: [name] , MD Date of Birth: 1946/05/31 CSN: [id number] Age: 74 Admit Type: Outpatient Procedure:                Colonoscopy Indications:              High risk colon cancer surveillance: Personal                            history of non-advanced adenoma, Last colonoscopy:                            2012 Providers:                Lajuan Lines. Hilarie Fredrickson, MD, Benetta Spar RN, RN, Janee Morn, Technician Referring MD:             Jearld Fenton Medicines:                Monitored Anesthesia Care Complications:            No immediate complications. Estimated Blood Loss:     Estimated blood loss was minimal. Procedure:                Pre-Anesthesia Assessment:                           - Prior to the procedure, a History and Physical                            was performed, and patient medications and                            allergies were reviewed. The patient's tolerance of                            previous anesthesia was also reviewed. The risks                            and benefits of the procedure and the sedation                            options and risks were discussed with the patient.                            All questions were answered, and informed consent                            was obtained. Prior Anticoagulants: The patient has                            taken Plavix (clopidogrel), last dose was 5 days  prior to procedure. ASA Grade Assessment: III - A                            patient with severe systemic disease. After                            reviewing the risks and benefits, the patient was                            deemed in satisfactory condition to undergo the                            procedure.                           After obtaining informed consent, the colonoscope                             was passed under direct vision. Throughout the                            procedure, the patient's blood pressure, pulse, and                            oxygen saturations were monitored continuously. The                            [id number]-HQ190L ([id number]) Olympus colonoscope was                            introduced through the anus and advanced to the                            cecum, identified by appendiceal orifice and                            ileocecal valve. The colonoscopy was performed                            without difficulty. The patient tolerated the                            procedure well. The quality of the bowel                            preparation was good. The ileocecal valve,                            appendiceal orifice, and rectum were photographed. Scope In: 8:40:57 AM Scope Out: 8:55:45 AM Scope Withdrawal Time: 0 hours 12 minutes 2 seconds  Total Procedure Duration: 0 hours 14 minutes 48 seconds  Findings:      The digital rectal exam was normal.      A 5 mm polyp was found in the descending colon. The polyp was sessile.  The polyp was removed with a cold snare. Resection and retrieval were       complete.      A few small-mouthed diverticula were found in the sigmoid colon and       ascending colon.      The exam was otherwise without abnormality on direct and retroflexion       views. Impression:               - One 5 mm polyp in the descending colon, removed                            with a cold snare. Resected and retrieved.                           - Diverticulosis in the sigmoid colon and in the                            ascending colon.                           - The examination was otherwise normal on direct                            and retroflexion views. Moderate Sedation:      N/A Recommendation:           - Patient has a contact number available for                            emergencies. The signs and symptoms of  potential                            delayed complications were discussed with the                            patient. Return to normal activities tomorrow.                            Written discharge instructions were provided to the                            patient.                           - Resume previous diet.                           - Continue present medications.                           - Resume Plavix (clopidogrel) at prior dose                            tomorrow. Refer to managing physician for further                            adjustment of therapy.                           -  Await pathology results.                           - No recommendation at this time regarding repeat                            colonoscopy due to age. Procedure Code(s):        --- Professional ---                           [phone], Colonoscopy, flexible; with removal of                            tumor(s), polyp(s), or other lesion(s) by snare                            technique Diagnosis Code(s):        --- Professional ---                           Z86.010, Personal history of colonic polyps                           K63.5, Polyp of colon                           K57.30, Diverticulosis of large intestine without                            perforation or abscess without bleeding CPT copyright 2019 American Medical Association. All rights reserved. The codes documented in this report are preliminary and upon coder review may  be revised to meet current compliance requirements. [name], MD 07/18/2020 9:27:57 AM This report has been signed electronically. Number of Addenda: 0

## 2020-07-19 ENCOUNTER — Other Ambulatory Visit: Payer: Self-pay

## 2020-07-20 ENCOUNTER — Encounter (HOSPITAL_COMMUNITY): Payer: Self-pay | Admitting: Internal Medicine

## 2020-07-20 LAB — SURGICAL PATHOLOGY

## 2020-07-22 ENCOUNTER — Ambulatory Visit: Payer: Medicare PPO | Admitting: Internal Medicine

## 2020-07-23 ENCOUNTER — Other Ambulatory Visit: Payer: Self-pay | Admitting: Internal Medicine

## 2020-07-27 ENCOUNTER — Other Ambulatory Visit: Payer: Self-pay | Admitting: Internal Medicine

## 2020-07-27 ENCOUNTER — Telehealth: Payer: Self-pay

## 2020-07-27 ENCOUNTER — Other Ambulatory Visit: Payer: Self-pay

## 2020-07-27 DIAGNOSIS — K3189 Other diseases of stomach and duodenum: Secondary | ICD-10-CM

## 2020-07-27 NOTE — Telephone Encounter (Signed)
   Primary Cardiologist: [name], MD  Chart reviewed and patient contacted by phone today as part of pre-operative protocol coverage. Given past medical history and time since last visit, based on ACC/AHA guidelines, Alicia [name] would be at acceptable risk for the planned procedure without further cardiovascular testing.   OK to hold Plavix 5 days pre op if needed- resume as soon as safe post op.   The patient was advised that if she develops new symptoms prior to surgery to contact our office to arrange for a follow-up visit, and she verbalized understanding.  I will route this recommendation to the requesting party via Epic fax function and remove from pre-op pool.  Please call with questions.  Kerin Ransom, PA-C 07/27/2020, 3:00 PM

## 2020-07-27 NOTE — Telephone Encounter (Signed)
[facility] Pre-operative Risk Assessment     Request for surgical clearance:     Endoscopy Procedure  What type of surgery is being performed?     EUS  When is this surgery scheduled?     09/19/20  What type of clearance is required ?   Pharmacy  Are there any medications that need to be held prior to surgery and how long? Plavix  Practice name and name of physician performing surgery?      [facility]  What is your office phone and fax number?      Phone- [phone]  [phone]  Anesthesia type (None, local, MAC, general) ?       MAC

## 2020-07-28 NOTE — Telephone Encounter (Signed)
Called to notify pt and no answer no voice mail will send to My Chart

## 2020-07-28 NOTE — Progress Notes (Deleted)
Cardiology Office Note   Date:  07/28/2020   ID:  Alicia, [name] [birth year], MRN [id number]  PCP:  Jearld Fenton, NP  Cardiologist:   [name], MD    F/U of CAD     History of Present Illness: Alicia [name] is a 74 y.o. female with a history of CAD status post DES to the RCA 10/2015, repeat cath 03/18/2017 for chest pain with DES to diagonal 1, 45% mid LAD treated with POBA, prior RCA stent was patent with a normal LVEF.  Also has a history of hypertension, hyperlipidemia, DM 2 and syncope (CT MRI and carotid Dopplers were all normal).  Echocardiogram at that time showed normal LVEF at 65 to 70%.  A 30-day monitor showed normal sinus rhythm with no arrhythmias in which the patient was felt to be dehydrated as her syncopal etiology.  Early 01/2019 patient suffered a right brain CVA treated with TPA.  Echocardiogram with normal LVEF and no source of embolus.  LDL was elevated at 139.  She was continued on Plavix and ASA therapies.  In October she had some chest pain. Cardiac cath performed 09/04/2019 which revealed moderate, nonobstructive coronary artery disease.  Most severe lesions were noted to be 50 to 60% in the mid LAD and 50% ostial D1 which were not thought to be hemodynamically significant by DFR.  Her stents were patent in D1 and proximal RCA.  Plan was to optimize medical therapy.  Her carvedilol was increased to 50 mg twice daily.   She ws seen by  Tonny Branch in October 2020       No outpatient medications have been marked as taking for the 07/29/20 encounter (Appointment) with [name], MD.     Allergies:   Iodinated diagnostic agents and Sulfa antibiotics   Past Medical History:  Diagnosis Date  . Anemia   . Arthritis    "right knee" (10/21/2015)  . CAD (coronary artery disease)    a. Cath 10/21/15: s/p DES to RCA: moderate diffuse stenosis of mod diagonal, diffuse irregularity LCx and LAD. 2D echo 10/21/15: mod LVH, EF 60-65%, no RWMA, calcified MV,  mod-severe LAE. 03/18/17 PCI with DES--> 1 diag  . Cataract   . Diabetes ([place])   . Essential hypertension   . Family history of adverse reaction to anesthesia    "oldest sister, [name], related to brain formation at back of head; when they put her to sleep it's hard to wake her up"  . GERD (gastroesophageal reflux disease)   . History of blood transfusion    "related to ruptured tubal pregnancy"  . Hyperlipidemia   . Hypertensive heart disease   . Morbid obesity ([place])   . Stroke (cerebrum) ([place])   . Tubular adenoma of colon   . Type II diabetes mellitus ([place])     Past Surgical History:  Procedure Laterality Date  . ABDOMINAL HYSTERECTOMY  1988  . APPENDECTOMY  1985  . [place] SURGERY  1984  . BIOPSY  07/18/2020   Procedure: BIOPSY;  Surgeon: [name], MD;  Location: [facility];  Service: Gastroenterology;;  . CARDIAC CATHETERIZATION N/A 10/21/2015   Procedure: Right/Left Heart Cath and Coronary Angiography;  Surgeon: [name], MD;  Location: [facility];  Service: Cardiovascular;  Laterality: N/A;  . CHOLECYSTECTOMY OPEN  1984  . COLONOSCOPY WITH PROPOFOL N/A 07/18/2020   Procedure: COLONOSCOPY WITH PROPOFOL;  Surgeon: [name], MD;  Location: [facility];  Service: Gastroenterology;  Laterality: N/A;  . CORONARY STENT INTERVENTION N/A 03/18/2017   Procedure: Coronary Stent Intervention;  Surgeon: [name], MD;  Location: [facility];  Service: Cardiovascular;  Laterality: N/A;  . CORONARY STENT PLACEMENT  03/18/2017   A OPTIMIZE STUDY Drug Eluting Stent (2.5 mm 18 mm - post-dilated to 2.7 mm) was successfully placed  . ECTOPIC PREGNANCY SURGERY    . ESOPHAGOGASTRODUODENOSCOPY (EGD) WITH PROPOFOL N/A 07/18/2020   Procedure: ESOPHAGOGASTRODUODENOSCOPY (EGD) WITH PROPOFOL;  Surgeon: [name], MD;  Location: [facility];  Service: Gastroenterology;  Laterality: N/A;  . INTRAVASCULAR PRESSURE WIRE/FFR STUDY N/A 09/04/2019   Procedure:  INTRAVASCULAR PRESSURE WIRE/FFR STUDY;  Surgeon: Nelva Bush, MD;  Location: [facility];  Service: Cardiovascular;  Laterality: N/A;  . LEFT HEART CATH AND CORONARY ANGIOGRAPHY N/A 03/18/2017   Procedure: Left Heart Cath and Coronary Angiography;  Surgeon: [name], MD;  Location: [facility];  Service: Cardiovascular;  Laterality: N/A;  . LEFT HEART CATH AND CORONARY ANGIOGRAPHY N/A 09/04/2019   Procedure: LEFT HEART CATH AND CORONARY ANGIOGRAPHY;  Surgeon: Nelva Bush, MD;  Location: [facility];  Service: Cardiovascular;  Laterality: N/A;  . POLYPECTOMY  07/18/2020   Procedure: POLYPECTOMY;  Surgeon: [name], MD;  Location: [facility];  Service: Gastroenterology;;  . REDUCTION MAMMAPLASTY Bilateral ~ 1976  . TONSILLECTOMY       Social History:  The patient  reports that she has never smoked. She has never used smokeless tobacco. She reports that she does not drink alcohol and does not use drugs.   Family History:  The patient's family history includes Diabetes in her mother, sister, sister, and sister; Heart attack in her father; Heart attack (age of onset: 26) in her mother; Heart disease in her mother; Hyperlipidemia in her father and mother; Hypertension in her father, mother, and son; Migraines in her daughter and daughter; Stomach cancer in her sister; Stroke (age of onset: 48) in her father.    ROS:  Please see the history of present illness. All other systems are reviewed and  Negative to the above problem except as noted.    PHYSICAL EXAM: VS:  There were no vitals taken for this visit.  GEN: Well nourished, well developed, in no acute distress  HEENT: normal  Neck: no JVD, carotid bruits, or masses Cardiac: RRR; no murmurs, rubs, or gallops,no edema  Respiratory:  clear to auscultation bilaterally, normal work of breathing GI: soft, nontender, nondistended, + BS  No hepatomegaly  MS: no deformity Moving all extremities   Skin: warm  and dry, no rash Neuro:  Strength and sensation are intact Psych: euthymic mood, full affect   EKG:  EKG is ordered today.   Lipid Panel    Component Value Date/Time   CHOL 108 02/08/2020 0855   TRIG 76 02/08/2020 0855   HDL 40 02/08/2020 0855   CHOLHDL 2.7 02/08/2020 0855   CHOLHDL 4.0 01/19/2019 0347   VLDL 10 01/19/2019 0347   LDLCALC 52 02/08/2020 0855      Wt Readings from Last 3 Encounters:  07/18/20 300 lb (136.1 kg)  06/01/20 (!) 311 lb 6.4 oz (141.3 kg)  05/13/20 (!) 310 lb 2 oz (140.7 kg)      ASSESSMENT AND PLAN:  1  CAD   Pt currently asymptomatic   Last cardiac cath in Oct 2020 with no flow limiting lesions   Continue risk factor modificaiton  2  HTN  Adequate control  3.  HL   LDL in October 2020 was 101  Now on Praluent   Will get lipids today    4  Wt loss>  Pt having difficult time with weight loss and glucose control   WIll refer back to [employer].   ? If meds need to be pulled back some so that she does not get drops in glucose.  Will defer any referral to dietary to [facility]    5  Renal  Pt has been seen in renal clinic   Will get records   F/U in 8 months    Current medicines are reviewed at length with the patient today.  The patient does not have concerns regarding medicines.  Signed, [name], MD  07/28/2020 10:35 PM    [address], [place], [place]  [address] Phone: [phone]; Fax: [phone]

## 2020-07-29 ENCOUNTER — Ambulatory Visit: Payer: Medicare PPO | Admitting: Internal Medicine

## 2020-08-28 ENCOUNTER — Other Ambulatory Visit: Payer: Self-pay | Admitting: Internal Medicine

## 2020-08-30 ENCOUNTER — Other Ambulatory Visit: Payer: Self-pay | Admitting: Internal Medicine

## 2020-09-03 ENCOUNTER — Other Ambulatory Visit: Payer: Self-pay | Admitting: Physician Assistant

## 2020-09-04 ENCOUNTER — Other Ambulatory Visit: Payer: Self-pay | Admitting: Physician Assistant

## 2020-09-04 DIAGNOSIS — I10 Essential (primary) hypertension: Secondary | ICD-10-CM

## 2020-09-15 ENCOUNTER — Other Ambulatory Visit (HOSPITAL_COMMUNITY)
Admission: RE | Admit: 2020-09-15 | Discharge: 2020-09-15 | Disposition: A | Discharge status: Home / Self Care | Payer: Medicare PPO | Source: Ambulatory Visit | Attending: Gastroenterology | Admitting: Gastroenterology

## 2020-09-15 DIAGNOSIS — Z01812 Encounter for preprocedural laboratory examination: Secondary | ICD-10-CM | POA: Diagnosis present

## 2020-09-15 DIAGNOSIS — Z20822 Contact with and (suspected) exposure to covid-19: Secondary | ICD-10-CM | POA: Insufficient documentation

## 2020-09-15 LAB — SARS CORONAVIRUS 2 (TAT 6-24 HRS): SARS Coronavirus 2: NEGATIVE

## 2020-09-16 ENCOUNTER — Telehealth: Payer: Self-pay | Admitting: Internal Medicine

## 2020-09-16 ENCOUNTER — Encounter (HOSPITAL_COMMUNITY): Payer: Self-pay | Admitting: Vascular Surgery

## 2020-09-16 ENCOUNTER — Other Ambulatory Visit: Payer: Self-pay | Admitting: Cardiology

## 2020-09-16 NOTE — Progress Notes (Signed)
PCP:  Webb Silversmith, NP Cardiologist:  Rico Sheehan, MD  EKG:  06/01/20 CXR:  07/13/17 ECHO:  01/19/19 Stress Test:  Denies Cardiac Cath:  09/04/19  Fasting Blood Sugar- 88-164 Checks Blood Sugar_2__ times a day  covid test 09/15/20  Anesthesia Review:  Yes, cardiac stents.  Was not told to be off Plavix or ASA.  She is calling on-call doctor tonight to get instrucions.   Patient denies shortness of breath, fever, cough, and chest pain at PAT appointment.  Patient verbalized understanding of instructions provided today at the PAT appointment.  Patient asked to review instructions at home and day of surgery.

## 2020-09-16 NOTE — Telephone Encounter (Signed)
The patient is scheduled for an EUS with possible removal of submucosal nodule in the duodenum on Monday 09/19/2020.  She called me to ask if it was acceptable to continue taking her Plavix.  She has not stopped Plavix 5 days before as instructions indicate, somehow there was a breakdown in communication.  She tells me she was expecting a call about this.  Records indicate she was notified at least through my chart and perhaps even by mail.  Given the contemplated procedures I told her this would need to be canceled and rescheduled when she could appropriately hold her Plavix.

## 2020-09-17 ENCOUNTER — Telehealth: Payer: Self-pay | Admitting: Physician Assistant

## 2020-09-18 ENCOUNTER — Other Ambulatory Visit: Payer: Self-pay | Admitting: Physician Assistant

## 2020-09-18 NOTE — Telephone Encounter (Signed)
Thank you for update. I am sorry that she was unable to stop her Plavix. We will reschedule her in the coming weeks as able. FYI JMP. Patty, please reach out to patient and get rescheduled and work on ensuring she does stop her Plavix as had been requested for the next procedure date. Thank you. GM

## 2020-09-19 ENCOUNTER — Ambulatory Visit (HOSPITAL_COMMUNITY): Admission: RE | Admit: 2020-09-19 | Payer: Medicare PPO | Source: Home / Self Care | Admitting: Gastroenterology

## 2020-09-19 ENCOUNTER — Encounter (HOSPITAL_COMMUNITY): Admission: RE | Payer: Self-pay | Source: Home / Self Care

## 2020-09-19 SURGERY — ESOPHAGOGASTRODUODENOSCOPY (EGD) WITH PROPOFOL
Anesthesia: Monitor Anesthesia Care

## 2020-09-19 NOTE — Telephone Encounter (Signed)
She called back on Sunday after I had told her it was cancelled on the first call Fri or Sat  Amy took Sunday call  Signs of memory problems

## 2020-09-19 NOTE — Telephone Encounter (Signed)
Please see the communication below between myself and the pt.  Information was mailed and sent to My Chart.  She responded to the [facility] Chart message that she did have all instructions and all her questions were answered.  Alicia [name]  would you like to see the pt in the office prior to rescheduling??     Message [id number] From  Alicia Lasso, RN To  Alicia [name] and Delivered  07/29/2020 12:01 PM  Last Read in MyChart  09/09/2020 10:27 AM by Alicia [name] are welcome have a good weekend  Previous Messages Instructions Message [id number] From  Alicia [name] To  Alicia Lasso, RN Sent  07/29/2020 11:53 AM  Thank you. I found this after I sent the last message. It answers all my questions. Instructions Message [id number] From  Alicia Lasso, RN To  Alicia [name] Sent and Delivered  07/27/2020 12:57 PM  Last Read in MyChart  09/09/2020 10:27 AM by Alicia [name] Alicia [name]                         DOB: 1945-12-22 MRN: [id number] Procedure Date: 09/19/20 Arrival Time: 745 am Procedure Time: 009 am    Location of Procedure: [facility]    Due to recent COVID-19 restrictions implemented by our local and state authorities and in an effort to keep both patients and staff as safe as possible, our hospital system now requires COVID-19 testing prior to any scheduled hospital procedure. Please go to [place], [place], [place] [address] on 09/15/20 at  1005 am. This is a drive up testing site, you will not need to exit your vehicle.  You will not be billed at the time of testing but may receive a bill later depending on your insurance. The approximate cost of the test is $100. You must agree to quarantine from the time of your testing until the procedure date on 09/19/20 . This should include staying at home with ONLY the people you live with. Avoid take-out, grocery store shopping or leaving the house for any non-emergent reason. Failure to have your COVID-19 test  done on the date and time you have been scheduled will result in cancellation of procedure. Please call our office at [phone] if you have any questions.    PREPARATION FOR ENDOSCOPY   On 09/19/20 THE DAY OF THE PROCEDURE:   1. No solid foods, milk or milk products are allowed after midnight the night before your procedure.   2. Do not drink anything colored red or purple. Avoid juices with pulp. No orange juice.   3. You may drink clear liquids until 515 am, which is 4 hours before your procedure.   CLEAR LIQUIDS INCLUDE: Water Jello  Ice Popsicles  Tea (sugar ok, no milk/cream) Powdered fruit flavored drinks  Coffee (sugar ok, no milk/cream) Gatorade  Juice: apple, white grape, white cranberry Lemonade  Clear bullion, consomme, broth Carbonated beverages (any kind)  Strained chicken noodle soup Hard Candy      MEDICATION INSTRUCTIONS   Unless otherwise instructed, you should take regular prescription medications with a small sip of water as early as possible the morning of your procedure.   Alicia [name] will call you regarding your medications    You will be contacted by our office prior to your procedure for directions on holding your Plavix.  If you do not hear from our office 1  week prior to your scheduled procedure, please call [phone] to discuss.               OTHER INSTRUCTIONS   You will need a responsible adult at least 73 years of age to accompany you and drive you home. This person must remain in the waiting room during your procedure.   Wear loose fitting clothing that is easily removed.   Leave jewelry and other valuables at home. However, you may wish to bring a book to read or an iPod/MP3 player to listen to music as you wait for your procedure to start.   Remove all body piercing jewelry and leave at home.   Total time from sign-in until discharge is approximately 2-3 hours.   You should go home directly after your procedure and rest. You can resume  normal activities the day after your procedure.   The day of your procedure you should not: Drive Make legal decisions Operate machinery Drink alcohol Return to work   You will receive specific instructions about eating, activities and medications before you leave. [address]  MyChart User Last Read On  Alicia [name] 09/09/2020 10:27 AM  Notification  A notification will be sent if the message is not read by Friday August 12, 2020

## 2020-09-20 ENCOUNTER — Telehealth: Payer: Self-pay | Admitting: Gastroenterology

## 2020-09-20 NOTE — Telephone Encounter (Signed)
See alternate message sent to Dr Rush Landmark regarding procedure vs office visit

## 2020-09-20 NOTE — Telephone Encounter (Signed)
Pt is requesting to reschedule her endoscopy at the hospital

## 2020-09-22 NOTE — Telephone Encounter (Signed)
Alicia [name],  the pt was given appropriate instructions for plavix and she responded that she understood and all questions were answered via My Chart as well as over the phone.  Dr Carlean Purl spoke with the pt when she did not stop plavix and he stated he thought she has some memory issues.  I sent a  message to Dr Rush Landmark to see if he would like for her to be seen in the office prior to rescheduling. I am waiting for him to respond.  She was notified that I will call her as soon as I get confirmation from Dr Rush Landmark on how he would like to proceed.

## 2020-09-27 ENCOUNTER — Other Ambulatory Visit: Payer: Self-pay | Admitting: Internal Medicine

## 2020-09-30 NOTE — Telephone Encounter (Signed)
Dr Rush Landmark should this patient have an office visit prior to rescheduling her procedure.  She has had issues with her last couple of appointments with understanding her instructions. Per Dr Carlean Purl he believes she may have some dementia.

## 2020-10-05 ENCOUNTER — Other Ambulatory Visit: Payer: Self-pay | Admitting: Internal Medicine

## 2020-10-21 ENCOUNTER — Telehealth: Payer: Self-pay | Admitting: Pharmacist

## 2020-10-21 MED ORDER — REPATHA SURECLICK 140 MG/ML ~~LOC~~ SOAJ: 1.0000 "pen " | SUBCUTANEOUS | 11 refills | Status: DC

## 2020-10-21 NOTE — Telephone Encounter (Signed)
[employer] insurance now prefers [employer]. [employer] approved through 11/18/2021. Rx sent to pharmacy. Pt made aware of the change.

## 2020-10-25 ENCOUNTER — Other Ambulatory Visit: Payer: Self-pay | Admitting: Internal Medicine

## 2020-10-25 DIAGNOSIS — I10 Essential (primary) hypertension: Secondary | ICD-10-CM

## 2020-10-25 DIAGNOSIS — I2510 Atherosclerotic heart disease of native coronary artery without angina pectoris: Secondary | ICD-10-CM

## 2020-10-27 ENCOUNTER — Other Ambulatory Visit: Payer: Self-pay | Admitting: Internal Medicine

## 2020-10-28 ENCOUNTER — Other Ambulatory Visit: Payer: Self-pay | Admitting: Internal Medicine

## 2020-10-30 ENCOUNTER — Other Ambulatory Visit: Payer: Self-pay | Admitting: Internal Medicine

## 2020-10-31 ENCOUNTER — Other Ambulatory Visit: Payer: Self-pay | Admitting: Internal Medicine

## 2020-11-18 ENCOUNTER — Other Ambulatory Visit: Payer: Self-pay | Admitting: Internal Medicine

## 2020-11-22 ENCOUNTER — Other Ambulatory Visit: Payer: Self-pay

## 2020-11-22 ENCOUNTER — Ambulatory Visit: Payer: Medicare PPO | Admitting: Gastroenterology

## 2020-11-22 ENCOUNTER — Other Ambulatory Visit (INDEPENDENT_AMBULATORY_CARE_PROVIDER_SITE_OTHER): Payer: Medicare PPO

## 2020-11-22 ENCOUNTER — Encounter: Payer: Self-pay | Admitting: Gastroenterology

## 2020-11-22 VITALS — BP 118/74 | HR 84 | Ht 65.0 in | Wt 307.0 lb

## 2020-11-22 DIAGNOSIS — K3189 Other diseases of stomach and duodenum: Secondary | ICD-10-CM

## 2020-11-22 DIAGNOSIS — Z7902 Long term (current) use of antithrombotics/antiplatelets: Secondary | ICD-10-CM | POA: Diagnosis not present

## 2020-11-22 DIAGNOSIS — R198 Other specified symptoms and signs involving the digestive system and abdomen: Secondary | ICD-10-CM

## 2020-11-22 LAB — BASIC METABOLIC PANEL
BUN: 22 mg/dL (ref 6–23)
CO2: 27 mEq/L (ref 19–32)
Calcium: 8.8 mg/dL (ref 8.4–10.5)
Chloride: 105 mEq/L (ref 96–112)
Creatinine, Ser: 1.97 mg/dL — ABNORMAL HIGH (ref 0.40–1.20)
GFR: 24.83 mL/min — ABNORMAL LOW (ref >60.00)
Glucose, Bld: 181 mg/dL — ABNORMAL HIGH (ref 70–99)
Potassium: 4.8 mEq/L (ref 3.5–5.1)
Sodium: 140 mEq/L (ref 135–145)

## 2020-11-22 LAB — CBC
HCT: 32.2 % — ABNORMAL LOW (ref 36.0–46.0)
Hemoglobin: 10.1 g/dL — ABNORMAL LOW (ref 12.0–15.0)
MCHC: 31.2 g/dL (ref 30.0–36.0)
MCV: 69.6 fl — ABNORMAL LOW (ref 78.0–100.0)
Platelets: 235 10*3/uL (ref 150.0–400.0)
RBC: 4.63 Mil/uL (ref 3.87–5.11)
RDW: 17.5 % — ABNORMAL HIGH (ref 11.5–15.5)
WBC: 4.5 10*3/uL (ref 4.0–10.5)

## 2020-11-22 LAB — PROTIME-INR
INR: 1 ratio (ref 0.8–1.0)
Prothrombin Time: 11.5 s (ref 9.6–13.1)

## 2020-11-22 NOTE — Patient Instructions (Signed)
Your provider has requested that you go to the basement level for lab work before leaving today. Press "B" on the elevator. The lab is located at the first door on the left as you exit the elevator.  You have been scheduled for an endoscopy and colonoscopy. Please follow the written instructions given to you at your visit today. Please pick up your prep supplies at the pharmacy within the next 1-3 days. If you use inhalers (even only as needed), please bring them with you on the day of your procedure.   If you are age 59 or older, your body mass index should be between 23-30. Your Body mass index is 51.09 kg/m. If this is out of the aforementioned range listed, please consider follow up with your Primary Care Provider.  Thank you for choosing me and [facility].  Dr. Rush Landmark

## 2020-11-24 ENCOUNTER — Telehealth: Payer: Self-pay | Admitting: Gastroenterology

## 2020-11-24 NOTE — Telephone Encounter (Signed)
Inbound call from patient's son (C L)requesting a call back please.  Has additional questions in regards to patient procedure scheduled for 01/02/2021.

## 2020-11-24 NOTE — Telephone Encounter (Signed)
The pt was scheduled while in the office for a visit please route to the [place]

## 2020-11-25 ENCOUNTER — Encounter: Payer: Self-pay | Admitting: Gastroenterology

## 2020-11-25 DIAGNOSIS — R198 Other specified symptoms and signs involving the digestive system and abdomen: Secondary | ICD-10-CM | POA: Insufficient documentation

## 2020-11-25 NOTE — Progress Notes (Signed)
[facility] VISIT   Primary Care Provider Jearld Fenton, NP [address] [place] [address] [phone]  Referring Provider Dr. Hilarie Fredrickson  Patient Profile: Alicia [name] is a 75 y.o. female with a pmh significant for CAD (on Plavix), hypertension, hyperlipidemia, prior stroke, diabetes, obesity, colon polyps, GERD, AVMs of the GI tract, duodenal nodule.  The patient presents to the [facility] for an evaluation and management of problem(s) noted below:  Problem List 1. Duodenal nodule   2. Abnormal findings on esophagogastroduodenoscopy (EGD)   3. Long term (current) use of antithrombotics/antiplatelets     History of Present Illness Please see initial consultation note and prior progress notes by Dr. Hilarie Fredrickson for full details of HPI.  Interval History This patient underwent an upper endoscopy in August 2021. Within the duodenal bulb was found a duodenal nodule.  Biopsies were obtained that returned benign.  However the endoscopic views are concerning for potential subepithelial lesion that is likely a duodenal neuroendocrine tumor/carcinoid.  The patient is referred for consideration of endoscopic ultrasound with potential endoscopic resection.  The patient had actually been directly scheduled in the winter 2021 but due to the patient not stopping her Plavix appropriately the procedure was canceled.  The patient and I are meeting for the first time today.  Patient describes a good understanding of what has occurred in regards to the findings.  She describes no other new GI issues other than what has been described to Dr. Hilarie Fredrickson in the past.  No changes in bowel habits currently.  She is not noticing any blood in her stools (melena or hematochezia).  The patient continues to take Plavix but has received approval to hold on this in the past for procedures.  GI Review of Systems Positive as above Negative for pain, bloating, change in  bowel habits  Review of Systems General: Denies fevers/chills/weight loss unintentionally HEENT: Denies oral lesions Cardiovascular: Denies chest pain Pulmonary: Denies shortness of breath Gastroenterological: See HPI Genitourinary: Denies darkened urine Hematological: Positive for history of easy bruising/bleeding due to Plavix Dermatological: Denies jaundice Psychological: Mood is stable   Medications Current Outpatient Medications  Medication Sig Dispense Refill  . amLODipine (NORVASC) 10 MG tablet TAKE 1 TABLET BY MOUTH EVERY DAY 90 tablet 1  . aspirin EC 81 MG tablet Take 81 mg by mouth daily. Swallow whole.    [name] atorvastatin (LIPITOR) 80 MG tablet TAKE 1 TABLET BY MOUTH DAILY AT 6 PM. 90 tablet 3  . carvedilol (COREG) 25 MG tablet Take 2 tablets (50 mg total) by mouth 2 (two) times daily with a meal. 120 tablet 6  . clopidogrel (PLAVIX) 75 MG tablet TAKE 1 TABLET BY MOUTH EVERY DAY 90 tablet 1  . doxylamine, Sleep, (UNISOM) 25 MG tablet Take 25 mg by mouth at bedtime.    . Evolocumab (REPATHA SURECLICK) 122 MG/ML SOAJ Inject 1 pen into the skin every 14 (fourteen) days. 2 mL 11  . ferrous sulfate 325 (65 FE) MG tablet TAKE 1 TABLET BY MOUTH EVERY DAY WITH BREAKFAST 90 tablet 0  . furosemide (LASIX) 20 MG tablet TAKE 1 TABLET BY MOUTH EVERY DAY 90 tablet 2  . glipiZIDE (GLUCOTROL) 10 MG tablet TAKE 1 TABLET (10 MG TOTAL) BY MOUTH 2 (TWO) TIMES DAILY BEFORE A MEAL. SCHEDULE OFFICE VISIT 60 tablet 0  . glucose blood (ONETOUCH ULTRA) test strip 1 each by Other route 2 (two) times daily. LAST REFILL SCHEDULE PHYSICAL 200 strip 0  .  isosorbide mononitrate (IMDUR) 60 MG 24 hr tablet TAKE 1 TABLET BY MOUTH EVERY DAY (Patient taking differently: Take 60 mg by mouth daily.) 90 tablet 0  . JANUVIA 100 MG tablet TAKE 1 TABLET BY MOUTH EVERY DAY 30 tablet 0  . linagliptin (TRADJENTA) 5 MG TABS tablet TAKE 1 TABLET (5 MG TOTAL) BY MOUTH DAILY. MUST SCHEDULE PHYSICAL 30 tablet 2  . losartan  (COZAAR) 100 MG tablet TAKE 1 TABLET BY MOUTH EVERY DAY (Patient taking differently: Take 100 mg by mouth daily.) 90 tablet 2  . metFORMIN (GLUCOPHAGE) 1000 MG tablet TAKE 1 TABLET (1,000 MG TOTAL) BY MOUTH 2 (TWO) TIMES DAILY WITH A MEAL. 180 tablet 2  . nitroGLYCERIN (NITROSTAT) 0.4 MG SL tablet Place 1 tablet (0.4 mg total) under the tongue every 5 (five) minutes as needed for chest pain. 25 tablet 3  . omeprazole (PRILOSEC) 40 MG capsule TAKE 1 CAPSULE BY MOUTH EVERY DAY (Patient taking differently: Take 40 mg by mouth daily.) 90 capsule 0  . spironolactone (ALDACTONE) 25 MG tablet TAKE 1 TABLET BY MOUTH EVERY DAY (Patient taking differently: Take 25 mg by mouth daily.) 90 tablet 0   No current facility-administered medications for this visit.    Allergies Allergies  Allergen Reactions  . Iodinated Diagnostic Agents Hives  . Sulfa Antibiotics Hives    Histories Past Medical History:  Diagnosis Date  . Anemia   . Arthritis    "right knee" (10/21/2015)  . CAD (coronary artery disease)    a. Cath 10/21/15: s/p DES to RCA: moderate diffuse stenosis of mod diagonal, diffuse irregularity LCx and LAD. 2D echo 10/21/15: mod LVH, EF 60-65%, no RWMA, calcified MV, mod-severe LAE. 03/18/17 PCI with DES--> 1 diag  . Cataract   . Diabetes ([place])   . Essential hypertension   . Family history of adverse reaction to anesthesia    "oldest sister, [name], related to brain formation at back of head; when they put her to sleep it's hard to wake her up"  . GERD (gastroesophageal reflux disease)   . History of blood transfusion    "related to ruptured tubal pregnancy"  . Hyperlipidemia   . Hypertensive heart disease   . Morbid obesity ([place])   . Stroke (cerebrum) ([place])   . Tubular adenoma of colon   . Type II diabetes mellitus ([place])    Past Surgical History:  Procedure Laterality Date  . ABDOMINAL HYSTERECTOMY  1988  . APPENDECTOMY  1985  . [place] SURGERY  1984  . BIOPSY  07/18/2020    Procedure: BIOPSY;  Surgeon: [name], MD;  Location: [facility];  Service: Gastroenterology;;  . CARDIAC CATHETERIZATION N/A 10/21/2015   Procedure: Right/Left Heart Cath and Coronary Angiography;  Surgeon: [name], MD;  Location: [facility];  Service: Cardiovascular;  Laterality: N/A;  . CHOLECYSTECTOMY OPEN  1984  . COLONOSCOPY WITH PROPOFOL N/A 07/18/2020   Procedure: COLONOSCOPY WITH PROPOFOL;  Surgeon: [name], MD;  Location: [facility];  Service: Gastroenterology;  Laterality: N/A;  . CORONARY STENT INTERVENTION N/A 03/18/2017   Procedure: Coronary Stent Intervention;  Surgeon: [name], MD;  Location: [facility];  Service: Cardiovascular;  Laterality: N/A;  . CORONARY STENT PLACEMENT  03/18/2017   A OPTIMIZE STUDY Drug Eluting Stent (2.5 mm 18 mm - post-dilated to 2.7 mm) was successfully placed  . ECTOPIC PREGNANCY SURGERY    . ESOPHAGOGASTRODUODENOSCOPY (EGD) WITH PROPOFOL N/A 07/18/2020   Procedure: ESOPHAGOGASTRODUODENOSCOPY (EGD) WITH PROPOFOL;  Surgeon: [name], MD;  Location: [facility];  Service: Gastroenterology;  Laterality: N/A;  . INTRAVASCULAR PRESSURE WIRE/FFR STUDY N/A 09/04/2019   Procedure: INTRAVASCULAR PRESSURE WIRE/FFR STUDY;  Surgeon: Nelva Bush, MD;  Location: [facility];  Service: Cardiovascular;  Laterality: N/A;  . LEFT HEART CATH AND CORONARY ANGIOGRAPHY N/A 03/18/2017   Procedure: Left Heart Cath and Coronary Angiography;  Surgeon: [name], MD;  Location: [facility];  Service: Cardiovascular;  Laterality: N/A;  . LEFT HEART CATH AND CORONARY ANGIOGRAPHY N/A 09/04/2019   Procedure: LEFT HEART CATH AND CORONARY ANGIOGRAPHY;  Surgeon: Nelva Bush, MD;  Location: [facility];  Service: Cardiovascular;  Laterality: N/A;  . POLYPECTOMY  07/18/2020   Procedure: POLYPECTOMY;  Surgeon: [name], MD;  Location: [facility];  Service: Gastroenterology;;  . REDUCTION MAMMAPLASTY  Bilateral ~ 1976  . TONSILLECTOMY     Social History   Socioeconomic History  . Marital status: Divorced    Spouse name: Not on file  . Number of children: 3  . Years of education: doctorate  . Highest education level: Not on file  Occupational History  . Occupation: Teacher/retired  Tobacco Use  . Smoking status: Never Smoker  . Smokeless tobacco: Never Used  Vaping Use  . Vaping Use: Never used  Substance and Sexual Activity  . Alcohol use: No  . Drug use: No  . Sexual activity: Not Currently  Other Topics Concern  . Not on file  Social History Narrative   Caffeine use-yes   Regular exercise-no   Social Determinants of Health   Financial Resource Strain: Not on file  Food Insecurity: Not on file  Transportation Needs: Not on file  Physical Activity: Not on file  Stress: Not on file  Social Connections: Not on file  Intimate Partner Violence: Not on file   Family History  Problem Relation Age of Onset  . Diabetes Mother   . Heart attack Mother 15       2007  . Hypertension Mother   . Hyperlipidemia Mother   . Heart disease Mother   . Heart attack Father   . Stroke Father 24  . Hypertension Father   . Hyperlipidemia Father   . Stomach cancer Sister        in the cavity behind the stomach  . Diabetes Sister   . Diabetes Sister   . Diabetes Sister   . Hypertension Son        weight loss corrected that  . Migraines Daughter        brain surgery  . Migraines Daughter   . Breast cancer Neg Hx   . Colon cancer Neg Hx   . Inflammatory bowel disease Neg Hx   . Liver disease Neg Hx   . Esophageal cancer Neg Hx   . Pancreatic cancer Neg Hx   . Rectal cancer Neg Hx    I have reviewed her medical, social, and family history in detail and updated the electronic medical record as necessary.    PHYSICAL EXAMINATION  BP 118/74   Pulse 84   Ht 5\' 5"  (1.651 m)   Wt (!) 307 lb (139.3 kg)   BMI 51.09 kg/m  Wt Readings from Last 3 Encounters:  11/22/20 (!)  307 lb (139.3 kg)  07/18/20 300 lb (136.1 kg)  06/01/20 (!) 311 lb 6.4 oz (141.3 kg)  GEN: NAD, appears stated age, doesn't appear chronically ill PSYCH: Cooperative, without pressured speech EYE: Conjunctivae  pink, sclerae anicteric ENT: Masked CV: Nontachycardic RESP: No audible wheezing GI: NABS, soft, obese, protuberant, nontender, without rebound or guarding  MSK/EXT: Lower extremity edema present SKIN: No jaundice NEURO:  Alert & Oriented x 3, no focal deficits   REVIEW OF DATA  I reviewed the following data at the time of this encounter:  GI Procedures and Studies  August 2021 EGD - Normal esophagus. - Prior gastric obesity procedure with proximal gastric pouch was found, characterized by friable mucosa, edema and erythema. Biopsied. - Normal gastric body and antrum. Biopsied. - Submucosal nodule found in the duodenum. Biopsied. - No evidence of angioectasias in the examined duodenum. Pathology FINAL MICROSCOPIC DIAGNOSIS:  A. DUODENUM, BIOPSY:  - Benign duodenal mucosa.  - No carcinoid tumor, gastrointestinal stromal tumor or features of  sprue.  - See comment.  B. GASTRIC, BODY AND ANTRUM, BIOPSY:  - Antral and oxyntic mucosa with slight chronic inflammation.  - Within started negative for Helicobacter pylori.  - No intestinal metaplasia, dysplasia or carcinoma.  C. GASTRIC, CARDIA AND FUNDUS, BIOPSY:  - Oxyntic mucosa with slight chronic inflammation and hyperemia.  - Warthin-Starry negative for Helicobacter pylori.  - No intestinal metaplasia, dysplasia or carcinoma.   Laboratory Studies  Reviewed those in epic  Imaging Studies  No relevant studies to review   ASSESSMENT  Ms. Vankleeck is a 75 y.o. female with a pmh significant for CAD (on Plavix), hypertension, hyperlipidemia, prior stroke, diabetes, obesity, colon polyps, GERD, AVMs of the GI tract, duodenal nodule.  The patient is seen today for evaluation and management of:  1. Duodenal nodule   2.  Abnormal findings on esophagogastroduodenoscopy (EGD)   3. Long term (current) use of antithrombotics/antiplatelets    The patient is clinically and hemodynamically stable.  Based upon the description and endoscopic pictures I do feel that it is reasonable to pursue an Advanced Polypectomy attempt of the polyp/lesion. The use of an endoscopic ultrasound to define how deep this lesion is will be important but also to ensure that there is no evidence of lymph node disease, since there is a concern that this could be a duodenal carcinoid that was incompletely sampled.  We discussed some of the techniques of advanced polypectomy which include Endoscopic Mucosal Resection, OVESCO Full-Thickness Resection, Endorotor Morcellation, and Tissue Ablation via Fulguration.  We also reviewed images of typical techniques as noted above.  The risks and benefits of endoscopic evaluation were discussed with the patient; these include but are not limited to the risk of perforation, infection, bleeding, missed lesions, lack of diagnosis, severe illness requiring hospitalization, as well as anesthesia and sedation related illnesses.  During attempts at advanced resection, the risks of bleeding and perforation/leak are increased as opposed to diagnostic and screening procedures, and that was discussed with the patient as well.  If, after attempt at removal of the polyp/lesion, it is found that the patient has a complication or that an invasive lesion or malignant lesion is found, or that the polyp/lesion continues to recur, the patient is aware and understands that surgery may still be indicated/required.  All patient questions were answered, to the best of my ability, and the patient agrees to the aforementioned plan of action with follow-up as indicated.  I have also scheduled a call with the patient's son later this week to go over things as well to ensure that he and his family understand the reasoning for his mother's  procedure.   PLAN  Preprocedure labs to be obtained as outlined  below Proceed with scheduling EGD/EUS with possible EMR of duodenal nodule Approval for Plavix hold will be obtained through the patient's provider Discussion with patient's son who will be had later this week   Orders Placed This Encounter  Procedures  . Procedural/ Surgical Case Request: UPPER ESOPHAGEAL ENDOSCOPIC ULTRASOUND (EUS), ENDOSCOPIC MUCOSAL RESECTION  . CBC  . Basic Metabolic Panel (BMET)  . INR/PT  . Ambulatory referral to Gastroenterology    New Prescriptions   No medications on file   Modified Medications   No medications on file    Planned Follow Up No follow-ups on file.   Total Time in Face-to-Face and in Coordination of Care for patient including independent/personal interpretation/review of prior testing, medical history, examination, medication adjustment, communicating results with the patient directly, and documentation with the EHR is 35 minutes.   Justice Britain, MD [facility] Advanced Endoscopy Office # [phone]

## 2020-11-25 NOTE — Telephone Encounter (Signed)
I spoke with Alicia [name] son Alicia [name] and he has questions about the procedure and is requesting details for what Dr Rush Landmark is looking for and what the outcome could possibility be. He would like a call back from Dr Rush Landmark . He can be reached at [phone]. I will route to Dr Rush Landmark.

## 2020-11-26 ENCOUNTER — Other Ambulatory Visit: Payer: Self-pay | Admitting: Internal Medicine

## 2020-11-26 ENCOUNTER — Encounter: Payer: Self-pay | Admitting: Gastroenterology

## 2020-11-27 ENCOUNTER — Other Ambulatory Visit: Payer: Self-pay | Admitting: Internal Medicine

## 2020-11-27 NOTE — Telephone Encounter (Signed)
Thank you for letting me know. I did call and speak with the patient's son for approximately 15 minutes on Friday afternoon. We discussed his mother's clinical scenario and recent visit.  As well as the reasoning for attempt at endoscopy with ultrasound and potential resection. All questions were answered completely. He will be bringing his mother for the procedure in February most likely. He will reach back out to [place] if further questions arise from the family or can send a MyChart message.

## 2020-11-29 ENCOUNTER — Other Ambulatory Visit: Payer: Self-pay

## 2020-11-29 ENCOUNTER — Other Ambulatory Visit: Payer: Self-pay | Admitting: Internal Medicine

## 2020-11-29 DIAGNOSIS — D509 Iron deficiency anemia, unspecified: Secondary | ICD-10-CM

## 2020-11-30 ENCOUNTER — Other Ambulatory Visit (INDEPENDENT_AMBULATORY_CARE_PROVIDER_SITE_OTHER): Payer: Medicare PPO

## 2020-11-30 DIAGNOSIS — D509 Iron deficiency anemia, unspecified: Secondary | ICD-10-CM

## 2020-11-30 LAB — IBC + FERRITIN
Ferritin: 70.4 ng/mL (ref 10.0–291.0)
Iron: 62 ug/dL (ref 42–145)
Saturation Ratios: 20.2 % (ref 20.0–50.0)
Transferrin: 219 mg/dL (ref 212.0–360.0)

## 2020-11-30 LAB — RETICULOCYTES
ABS Retic: 87780 cells/uL — ABNORMAL HIGH (ref 20000–8000)
Retic Ct Pct: 1.9 %

## 2020-11-30 LAB — FOLATE: Folate: 12.3 ng/mL (ref >5.9)

## 2020-11-30 LAB — VITAMIN B12: Vitamin B-12: 186 pg/mL — ABNORMAL LOW (ref 211–911)

## 2020-12-01 ENCOUNTER — Other Ambulatory Visit: Payer: Self-pay

## 2020-12-01 DIAGNOSIS — D509 Iron deficiency anemia, unspecified: Secondary | ICD-10-CM

## 2020-12-02 ENCOUNTER — Telehealth: Payer: Self-pay | Admitting: Gastroenterology

## 2020-12-02 MED ORDER — LINAGLIPTIN 5 MG PO TABS: ORAL_TABLET | ORAL | 0 refills | Status: DC

## 2020-12-02 MED ORDER — SITAGLIPTIN PHOSPHATE 100 MG PO TABS: 100.0000 mg | ORAL_TABLET | Freq: Every day | ORAL | 0 refills | Status: DC

## 2020-12-02 MED ORDER — ONDANSETRON 4 MG PO TBDP: 4.0000 mg | ORAL_TABLET | Freq: Three times a day (TID) | ORAL | 1 refills | Status: DC | PRN

## 2020-12-02 MED ORDER — GLIPIZIDE 10 MG PO TABS: 10.0000 mg | ORAL_TABLET | Freq: Two times a day (BID) | ORAL | 0 refills | Status: DC

## 2020-12-02 NOTE — Telephone Encounter (Signed)
Zofran 4 mg ODT every 8 hrs PRN nausea

## 2020-12-02 NOTE — Telephone Encounter (Signed)
Script sent to pharmacy and pt aware. 

## 2020-12-02 NOTE — Telephone Encounter (Signed)
Pt states she has been having nausea due to bad acid reflux since yesterday, she was not able to keep anything down. Earlier today her sister gave her some pepcid and some benadryl that she took and she was able to eat and get her blood sugar back up. She takes omeprazole 40mg  bid. Discussed with her that the pepcid 20mg  otc was ok to take along with the omeprazole if needed. Pt requesting something for nausea. Please advise.

## 2020-12-02 NOTE — Addendum Note (Signed)
Addended by: Lurlean Nanny on: 12/02/2020 09:05 AM   Modules accepted: Orders

## 2020-12-02 NOTE — Telephone Encounter (Signed)
Please route to Dr Garth Schlatter nurse.  Dr Rush Landmark is only doing the procedure for Dr Hilarie Fredrickson.

## 2020-12-02 NOTE — Telephone Encounter (Addendum)
Inbound call from patient stating she has been vomiting and having really bad acid reflux since yesterday, cannot keep anything down.  Wants to know what she should do.  Please advise.

## 2020-12-08 NOTE — Progress Notes (Signed)
Cardiology Office Note   Date:  12/12/2020   ID:  Alicia, [name] 74-30-1947, MRN [id number]  PCP:  Jearld Fenton, NP  Cardiologist:   [name], MD    F/U of CAD     History of Present Illness: Alicia [name] is a 75 y.o. female with a history of CAD status post DES to the RCA 10/2015.  Repeat cath 03/18/2017 for chest pain with DES to diagonal 1, 45% mid LAD treated with POBA, prior RCA stent was patent with a normal LVEF.   Pt also has a history of hypertension, hyperlipidemia, DM 2 and syncope (CT MRI and carotid Dopplers were all normal).  Echocardiogram at that time showed normal LVEF at 65 to 70%.  A 30-day monitor showed normal sinus rhythm with no arrhythmias in which the patient was felt to be dehydrated as her syncopal etiology.  Early 01/2019 patient suffered a right brain CVA treated with TPA.  Echocardiogram with normal LVEF and no source of embolus.  LDL was elevated at 139.  She was continued on Plavix and ASA therapies.  In October she had some chest pain. Cardiac cath performed 09/04/2019 which revealed moderate, nonobstructive coronary artery disease.  Most severe lesions were noted to be 50 to 60% in the mid LAD and 50% ostial D1 which were not thought to be hemodynamically significant by DFR.  Her stents were patent in D1 and proximal RCA.  Plan was to optimize medical therapy.  Her carvedilol was increased to 50 mg twice daily.    I saw the pt in Feb 2021   She was seen by Arnold Long in July 2021  Since seen the pt says she is tired all the time   [name] is OK  She denies CP   Having EGD in Feb 2022 Glu was 80      A1C was 8.1 % Current Meds  Medication Sig  . amLODipine (NORVASC) 10 MG tablet TAKE 1 TABLET BY MOUTH EVERY DAY  . aspirin EC 81 MG tablet Take 81 mg by mouth daily. Swallow whole.  [name] atorvastatin (LIPITOR) 80 MG tablet TAKE 1 TABLET BY MOUTH DAILY AT 6 PM.  . carvedilol (COREG) 25 MG tablet TAKE 2 TABLETS (50 MG TOTAL) BY MOUTH 2 (TWO)  TIMES DAILY WITH A MEAL.  [name] clopidogrel (PLAVIX) 75 MG tablet TAKE 1 TABLET BY MOUTH EVERY DAY  . doxylamine, Sleep, (UNISOM) 25 MG tablet Take 25 mg by mouth at bedtime.  . Evolocumab (REPATHA SURECLICK) 735 MG/ML SOAJ Inject 1 pen into the skin every 14 (fourteen) days.  . ferrous sulfate 325 (65 FE) MG tablet TAKE 1 TABLET BY MOUTH EVERY DAY WITH BREAKFAST  . furosemide (LASIX) 20 MG tablet TAKE 1 TABLET BY MOUTH EVERY DAY  . glipiZIDE (GLUCOTROL) 10 MG tablet Take 1 tablet (10 mg total) by mouth 2 (two) times daily before a meal.  . glucose blood (ONETOUCH ULTRA) test strip 1 each by Other route 2 (two) times daily. LAST REFILL SCHEDULE PHYSICAL  . isosorbide mononitrate (IMDUR) 60 MG 24 hr tablet TAKE 1 TABLET BY MOUTH EVERY DAY  . linagliptin (TRADJENTA) 5 MG TABS tablet TAKE 1 TABLET (5 MG TOTAL) BY MOUTH DAILY. MUST SCHEDULE PHYSICAL  . losartan (COZAAR) 100 MG tablet TAKE 1 TABLET BY MOUTH EVERY DAY (Patient taking differently: Take 100 mg by mouth daily.)  . metFORMIN (GLUCOPHAGE) 1000 MG tablet TAKE 1 TABLET (1,000 MG TOTAL) BY MOUTH 2 (TWO) TIMES DAILY  WITH A MEAL.  . nitroGLYCERIN (NITROSTAT) 0.4 MG SL tablet Place 1 tablet (0.4 mg total) under the tongue every 5 (five) minutes as needed for chest pain.  [name] omeprazole (PRILOSEC) 40 MG capsule TAKE 1 CAPSULE BY MOUTH EVERY DAY  . ondansetron (ZOFRAN ODT) 4 MG disintegrating tablet Take 1 tablet (4 mg total) by mouth every 8 (eight) hours as needed for nausea or vomiting.  [name] PRESCRIPTION MEDICATION Vitamin B12 supplement  . sitaGLIPtin (JANUVIA) 100 MG tablet Take 1 tablet (100 mg total) by mouth daily.  [name] spironolactone (ALDACTONE) 25 MG tablet TAKE 1 TABLET BY MOUTH EVERY DAY     Allergies:   Iodinated diagnostic agents and Sulfa antibiotics   Past Medical History:  Diagnosis Date  . Anemia   . Arthritis    "right knee" (10/21/2015)  . CAD (coronary artery disease)    a. Cath 10/21/15: s/p DES to RCA: moderate diffuse stenosis  of mod diagonal, diffuse irregularity LCx and LAD. 2D echo 10/21/15: mod LVH, EF 60-65%, no RWMA, calcified MV, mod-severe LAE. 03/18/17 PCI with DES--> 1 diag  . Cataract   . Diabetes ([place])   . Essential hypertension   . Family history of adverse reaction to anesthesia    "oldest sister, Alicia [name], related to brain formation at back of head; when they put her to sleep it's hard to wake her up"  . GERD (gastroesophageal reflux disease)   . History of blood transfusion    "related to ruptured tubal pregnancy"  . Hyperlipidemia   . Hypertensive heart disease   . Morbid obesity (Alicia [name])   . Stroke (cerebrum) (Alicia [name])   . Tubular adenoma of colon   . Type II diabetes mellitus (Alicia [name])     Past Surgical History:  Procedure Laterality Date  . ABDOMINAL HYSTERECTOMY  1988  . APPENDECTOMY  1985  . [place] SURGERY  1984  . BIOPSY  07/18/2020   Procedure: BIOPSY;  Surgeon: [name], MD;  Location: [facility];  Service: Gastroenterology;;  . CARDIAC CATHETERIZATION N/A 10/21/2015   Procedure: Right/Left Heart Cath and Coronary Angiography;  Surgeon: [name], MD;  Location: [facility];  Service: Cardiovascular;  Laterality: N/A;  . CHOLECYSTECTOMY OPEN  1984  . COLONOSCOPY WITH PROPOFOL N/A 07/18/2020   Procedure: COLONOSCOPY WITH PROPOFOL;  Surgeon: [name], MD;  Location: [facility];  Service: Gastroenterology;  Laterality: N/A;  . CORONARY STENT INTERVENTION N/A 03/18/2017   Procedure: Coronary Stent Intervention;  Surgeon: [name], MD;  Location: [facility];  Service: Cardiovascular;  Laterality: N/A;  . CORONARY STENT PLACEMENT  03/18/2017   A OPTIMIZE STUDY Drug Eluting Stent (2.5 mm 18 mm - post-dilated to 2.7 mm) was successfully placed  . ECTOPIC PREGNANCY SURGERY    . ESOPHAGOGASTRODUODENOSCOPY (EGD) WITH PROPOFOL N/A 07/18/2020   Procedure: ESOPHAGOGASTRODUODENOSCOPY (EGD) WITH PROPOFOL;  Surgeon: [name], MD;  [facility]: [facility];  Service:  Gastroenterology;  Laterality: N/A;  . INTRAVASCULAR PRESSURE WIRE/FFR STUDY N/A 09/04/2019   Procedure: INTRAVASCULAR PRESSURE WIRE/FFR STUDY;  Surgeon: Nelva Bush, MD;  Location: [facility];  Service: Cardiovascular;  Laterality: N/A;  . LEFT HEART CATH AND CORONARY ANGIOGRAPHY N/A 03/18/2017   Procedure: Left Heart Cath and Coronary Angiography;  Surgeon: [name], MD;  Location: [facility];  Service: Cardiovascular;  Laterality: N/A;  . LEFT HEART CATH AND CORONARY ANGIOGRAPHY N/A 09/04/2019   Procedure: LEFT HEART CATH AND CORONARY ANGIOGRAPHY;  Surgeon: Nelva Bush,  MD;  Location: [facility];  Service: Cardiovascular;  Laterality: N/A;  . POLYPECTOMY  07/18/2020   Procedure: POLYPECTOMY;  Surgeon: [name], MD;  Location: [facility];  Service: Gastroenterology;;  . REDUCTION MAMMAPLASTY Bilateral ~ 1976  . TONSILLECTOMY       Social History:  The patient  reports that she has never smoked. She has never used smokeless tobacco. She reports that she does not drink alcohol and does not use drugs.   Family History:  The patient's family history includes Diabetes in her mother, sister, sister, and sister; Heart attack in her father; Heart attack (age of onset: 94) in her mother; Heart disease in her mother; Hyperlipidemia in her father and mother; Hypertension in her father, mother, and son; Migraines in her daughter and daughter; Stomach cancer in her sister; Stroke (age of onset: 55) in her father.    ROS:  Please see the history of present illness. All other systems are reviewed and  Negative to the above problem except as noted.    PHYSICAL EXAM: VS:  BP 122/64   Pulse 73   Ht 5\' 5"  (1.651 m)   Wt (!) 309 lb 3.2 oz (140.3 kg)   SpO2 99%   BMI 51.45 kg/m   LAG:TXMIWOEH obese 75 yo in no acute distress  HEENT: normal  Neck: no JVD, carotid bruits  Cardiac: RRR; no murmurs no LE  edema  Respiratory:  clear to auscultation bilaterally,   GI: soft, nontender, nondistended, + BS  No hepatomegaly  MS: no deformity Moving all extremities   Skin: warm and dry, no rash Neuro:  Strength and sensation are intact Psych: euthymic mood, full affect   EKG:  EKG is not  ordered today.   Lipid Panel    Component Value Date/Time   CHOL 108 02/08/2020 0855   TRIG 76 02/08/2020 0855   HDL 40 02/08/2020 0855   CHOLHDL 2.7 02/08/2020 0855   CHOLHDL 4.0 01/19/2019 0347   VLDL 10 01/19/2019 0347   LDLCALC 52 02/08/2020 0855      Wt Readings from Last 3 Encounters:  12/12/20 (!) 309 lb 3.2 oz (140.3 kg)  11/22/20 (!) 307 lb (139.3 kg)  07/18/20 300 lb (136.1 kg)      ASSESSMENT AND PLAN:  1  CAD Last cath in 2020  No flow limiting disease  Pt without angina   I am not convinced fatigue represents anginal equiv  2  HTN  Adequate control  Continue meds  Check BMET  3.  HL   Will get lipids   4  Hx CVA    Stays on ASA and Plavix    5  Renal  Pt has been seen in renal clinic Get BMET  6  Fatigue  Check TSH   CBC just checked  Mild anemia   F/U in Aug 2022   Current medicines are reviewed at length with the patient today.  The patient does not have concerns regarding medicines.  Signed, [name], MD  12/12/2020 10:19 AM    [facility] [address], [place], [place]  [address] Phone: [phone]; Fax: [phone]

## 2020-12-12 ENCOUNTER — Ambulatory Visit: Payer: Medicare PPO | Admitting: Internal Medicine

## 2020-12-12 ENCOUNTER — Encounter: Payer: Self-pay | Admitting: Internal Medicine

## 2020-12-12 ENCOUNTER — Other Ambulatory Visit: Payer: Self-pay

## 2020-12-12 VITALS — BP 122/64 | HR 73 | Ht 65.0 in | Wt 309.2 lb

## 2020-12-12 DIAGNOSIS — I2510 Atherosclerotic heart disease of native coronary artery without angina pectoris: Secondary | ICD-10-CM

## 2020-12-12 DIAGNOSIS — I10 Essential (primary) hypertension: Secondary | ICD-10-CM | POA: Diagnosis not present

## 2020-12-12 DIAGNOSIS — Z1321 Encounter for screening for nutritional disorder: Secondary | ICD-10-CM | POA: Diagnosis not present

## 2020-12-12 DIAGNOSIS — E785 Hyperlipidemia, unspecified: Secondary | ICD-10-CM

## 2020-12-12 NOTE — Patient Instructions (Signed)
Medication Instructions:  No changes *If you need a refill on your cardiac medications before your next appointment, please call your pharmacy*   Lab Work: Today: bmet, tsh, vit d, lipids If you have labs (blood work) drawn today and your tests are completely normal, you will receive your results only by: [name] MyChart Message (if you have MyChart) OR . A paper copy in the mail If you have any lab test that is abnormal or we need to change your treatment, we will call you to review the results.   Testing/Procedures: none   Follow-Up: At [facility], you and your health needs are our priority.  As part of our continuing mission to provide you with exceptional heart care, we have created designated Provider Care Teams.  These Care Teams include your primary Cardiologist (physician) and Advanced Practice Providers (APPs -  Physician Assistants and Nurse Practitioners) who all work together to provide you with the care you need, when you need it.     Your next appointment:   7 month(s)  The format for your next appointment:   In Person  Provider:   You may see [name], MD or one of the following Advanced Practice Providers on your designated Care Team:    Richardson Dopp, PA-C  Robbie Lis, [place]    Other Instructions

## 2020-12-13 ENCOUNTER — Other Ambulatory Visit: Payer: Self-pay

## 2020-12-13 LAB — LIPID PANEL
Chol/HDL Ratio: 2.3 ratio (ref 0.0–4.4)
Cholesterol, Total: 94 mg/dL — ABNORMAL LOW (ref 100–199)
HDL: 41 mg/dL (ref >39)
LDL Chol Calc (NIH): 41 mg/dL (ref 0–99)
Triglycerides: 49 mg/dL (ref 0–149)
VLDL Cholesterol Cal: 12 mg/dL (ref 5–40)

## 2020-12-13 LAB — BASIC METABOLIC PANEL
BUN/Creatinine Ratio: 13 (ref 12–28)
BUN: 26 mg/dL (ref 8–27)
CO2: 23 mmol/L (ref 20–29)
Calcium: 8.5 mg/dL — ABNORMAL LOW (ref 8.7–10.3)
Chloride: 104 mmol/L (ref 96–106)
Creatinine, Ser: 2.05 mg/dL — ABNORMAL HIGH (ref 0.57–1.00)
GFR calc Af Amer: 27 mL/min/{1.73_m2} — ABNORMAL LOW (ref >59)
GFR calc non Af Amer: 24 mL/min/{1.73_m2} — ABNORMAL LOW (ref >59)
Glucose: 61 mg/dL — ABNORMAL LOW (ref 65–99)
Potassium: 5.2 mmol/L (ref 3.5–5.2)
Sodium: 139 mmol/L (ref 134–144)

## 2020-12-13 LAB — TSH: TSH: 2.04 u[IU]/mL (ref 0.450–4.500)

## 2020-12-13 LAB — VITAMIN D 25 HYDROXY (VIT D DEFICIENCY, FRACTURES): Vit D, 25-Hydroxy: 14.3 ng/mL — ABNORMAL LOW (ref 30.0–100.0)

## 2020-12-13 MED ORDER — VITAMIN B 12 500 MCG PO TABS: 4.0000 | ORAL_TABLET | Freq: Every day | ORAL | 2 refills | Status: AC

## 2020-12-14 ENCOUNTER — Telehealth: Payer: Self-pay | Admitting: *Deleted

## 2020-12-14 DIAGNOSIS — I10 Essential (primary) hypertension: Secondary | ICD-10-CM

## 2020-12-14 DIAGNOSIS — E559 Vitamin D deficiency, unspecified: Secondary | ICD-10-CM

## 2020-12-14 MED ORDER — VITAMIN D (ERGOCALCIFEROL) 1.25 MG (50000 UNIT) PO CAPS: 50000.0000 [IU] | ORAL_CAPSULE | ORAL | 0 refills | Status: DC

## 2020-12-14 MED ORDER — LOSARTAN POTASSIUM 50 MG PO TABS: 50.0000 mg | ORAL_TABLET | Freq: Every day | ORAL | 3 refills | Status: DC

## 2020-12-14 NOTE — Telephone Encounter (Signed)
-----   Message from [name], MD sent at 12/13/2020 10:04 PM EST ----- Lipids are excellent  Thyroid function is normal Vit D is low   14  REcomm 50,000 u per week for 8 wks and recheck Cr is a little higher    could try 50 losartan and follow up BMET in 2 wks   Keep track of BP

## 2020-12-14 NOTE — Telephone Encounter (Signed)
Patient verbalizes understanding of recommendations and lab results. She will cut losartan to 50 mg daily and return 2/9 for BMET.  She will begin vit D 50,000 units and return 3/25 for vit D level.

## 2020-12-22 ENCOUNTER — Other Ambulatory Visit: Payer: Self-pay | Admitting: Internal Medicine

## 2020-12-27 ENCOUNTER — Other Ambulatory Visit: Payer: Self-pay

## 2020-12-28 ENCOUNTER — Other Ambulatory Visit: Payer: Medicare PPO

## 2020-12-28 ENCOUNTER — Other Ambulatory Visit: Payer: Self-pay

## 2020-12-28 DIAGNOSIS — I10 Essential (primary) hypertension: Secondary | ICD-10-CM

## 2020-12-28 DIAGNOSIS — E559 Vitamin D deficiency, unspecified: Secondary | ICD-10-CM

## 2020-12-29 ENCOUNTER — Other Ambulatory Visit (HOSPITAL_COMMUNITY)
Admission: RE | Admit: 2020-12-29 | Discharge: 2020-12-29 | Disposition: A | Discharge status: Home / Self Care | Payer: Medicare PPO | Source: Ambulatory Visit | Attending: Gastroenterology | Admitting: Gastroenterology

## 2020-12-29 DIAGNOSIS — U071 COVID-19: Secondary | ICD-10-CM | POA: Insufficient documentation

## 2020-12-29 DIAGNOSIS — Z01812 Encounter for preprocedural laboratory examination: Secondary | ICD-10-CM | POA: Insufficient documentation

## 2020-12-29 LAB — BASIC METABOLIC PANEL
BUN/Creatinine Ratio: 14 (ref 12–28)
BUN: 26 mg/dL (ref 8–27)
CO2: 21 mmol/L (ref 20–29)
Calcium: 8.8 mg/dL (ref 8.7–10.3)
Chloride: 106 mmol/L (ref 96–106)
Creatinine, Ser: 1.83 mg/dL — ABNORMAL HIGH (ref 0.57–1.00)
GFR calc Af Amer: 31 mL/min/{1.73_m2} — ABNORMAL LOW (ref >59)
GFR calc non Af Amer: 27 mL/min/{1.73_m2} — ABNORMAL LOW (ref >59)
Glucose: 63 mg/dL — ABNORMAL LOW (ref 65–99)
Potassium: 5.5 mmol/L — ABNORMAL HIGH (ref 3.5–5.2)
Sodium: 142 mmol/L (ref 134–144)

## 2020-12-29 LAB — SARS CORONAVIRUS 2 (TAT 6-24 HRS): SARS Coronavirus 2: POSITIVE — AB

## 2020-12-30 ENCOUNTER — Telehealth: Payer: Self-pay

## 2020-12-30 ENCOUNTER — Other Ambulatory Visit: Payer: Self-pay

## 2020-12-30 ENCOUNTER — Telehealth: Payer: Self-pay | Admitting: Gastroenterology

## 2020-12-30 DIAGNOSIS — K3189 Other diseases of stomach and duodenum: Secondary | ICD-10-CM

## 2020-12-30 DIAGNOSIS — R198 Other specified symptoms and signs involving the digestive system and abdomen: Secondary | ICD-10-CM

## 2020-12-30 NOTE — Telephone Encounter (Signed)
[place] Covid testing site called to inform that patient tested positive for Covid. She is scheduled for a procedure with Dr. Rush Landmark next Monday 01/02/21.

## 2020-12-30 NOTE — Telephone Encounter (Signed)
Pt called to say she was scheduled for surgery 01-02-21. She tested positive for Covid. Her surgery has been moved to March. Her cardiologist has sent in a referral for the antiviral medications at the infusion center. The infusion center told her to check with her PCP to see if she should do it. I told her I thought that she should most likely go ahead with the medication. But I would send this to [facility] just in case.

## 2020-12-30 NOTE — Telephone Encounter (Signed)
Called to discuss with patient about COVID-19 symptoms and the use of one of the available treatments for those with mild to moderate Covid symptoms and at a high risk of hospitalization.  Pt appears to qualify for outpatient treatment due to co-morbid conditions and/or a member of an at-risk group in accordance with the FDA Emergency Use Authorization.    Symptom onset:12/27/20 Vaccinated: Yes Booster? Yes Immunocompromised? No Qualifiers: Diabetes,HTN, Obesity  Pt. States she is feeling better, "but I'll think about it." Given call back number.    Alicia [name]

## 2020-12-30 NOTE — Progress Notes (Addendum)
[place] with Dr. Donneta Romberg office with + covid results for this pt. The pt is to have surgery on 01/02/21 at [facility] at 0900. The pt has not been notified as there will be pertinent questions the provider needs to answer.  These are the current guidelines:  Positive Results for:  Asymptomatic: Procedure postponed and quarantined for 10 days, unless the procedure is urgent. Symptomatic: Procedure postponed and quarantined for 14 days. Hospitalized with Covid: postponed and quarantined  for 21 days. Immunocompromised: Procedure postponed and quarantine for 20 days.  The pt will not be retested for 90 days from the + result.

## 2020-12-30 NOTE — Telephone Encounter (Cosign Needed)
Yes, given her history, she should take it

## 2020-12-30 NOTE — Telephone Encounter (Signed)
See alternate note  

## 2021-01-02 ENCOUNTER — Ambulatory Visit (HOSPITAL_COMMUNITY): Admission: RE | Admit: 2021-01-02 | Payer: Medicare PPO | Source: Home / Self Care | Admitting: Gastroenterology

## 2021-01-02 SURGERY — UPPER ESOPHAGEAL ENDOSCOPIC ULTRASOUND (EUS)
Anesthesia: Monitor Anesthesia Care

## 2021-01-03 ENCOUNTER — Telehealth: Payer: Self-pay | Admitting: *Deleted

## 2021-01-03 DIAGNOSIS — I10 Essential (primary) hypertension: Secondary | ICD-10-CM

## 2021-01-03 MED ORDER — SPIRONOLACTONE 25 MG PO TABS: 12.5000 mg | ORAL_TABLET | Freq: Every day | ORAL | 3 refills | Status: DC

## 2021-01-03 NOTE — Telephone Encounter (Signed)
Patient informed of lab results and recommendations.  She will cut spironolactone in half.  She is having a GI procedure on 3/7 and may have labs done around that time.  If not, we will schedule her to come in following that for repeat BMET.

## 2021-01-03 NOTE — Telephone Encounter (Signed)
-----   Message from [name] V, MD sent at 12/29/2020 10:38 AM EST ----- Cr is better   1.83 K is a little high   I would cut spironolactone to 12,5 mg    Follow again BMET in 3 wks Keep tabs on BP

## 2021-01-17 NOTE — Progress Notes (Signed)
Attempted to obtain medical history via telephone, unable to reach at this time. I left a voicemail to return pre surgical testing department's phone call.  

## 2021-01-18 ENCOUNTER — Other Ambulatory Visit: Payer: Self-pay

## 2021-01-18 ENCOUNTER — Encounter (HOSPITAL_COMMUNITY): Payer: Self-pay | Admitting: Gastroenterology

## 2021-01-22 ENCOUNTER — Other Ambulatory Visit: Payer: Self-pay | Admitting: Internal Medicine

## 2021-01-23 ENCOUNTER — Encounter (HOSPITAL_COMMUNITY)
Admission: RE | Disposition: A | Discharge status: Home / Self Care | Payer: Self-pay | Source: Home / Self Care | Attending: Gastroenterology

## 2021-01-23 ENCOUNTER — Encounter (HOSPITAL_COMMUNITY): Payer: Self-pay | Admitting: Gastroenterology

## 2021-01-23 ENCOUNTER — Ambulatory Visit (HOSPITAL_COMMUNITY)
Admission: RE | Admit: 2021-01-23 | Discharge: 2021-01-23 | Disposition: A | Discharge status: Home / Self Care | Payer: Medicare PPO | Attending: Gastroenterology | Admitting: Gastroenterology

## 2021-01-23 ENCOUNTER — Ambulatory Visit (HOSPITAL_COMMUNITY): Payer: Medicare PPO | Admitting: Anesthesiology

## 2021-01-23 ENCOUNTER — Other Ambulatory Visit: Payer: Self-pay

## 2021-01-23 DIAGNOSIS — D175 Benign lipomatous neoplasm of intra-abdominal organs: Secondary | ICD-10-CM | POA: Diagnosis not present

## 2021-01-23 DIAGNOSIS — K3189 Other diseases of stomach and duodenum: Secondary | ICD-10-CM | POA: Insufficient documentation

## 2021-01-23 DIAGNOSIS — Z91041 Radiographic dye allergy status: Secondary | ICD-10-CM | POA: Insufficient documentation

## 2021-01-23 DIAGNOSIS — K2289 Other specified disease of esophagus: Secondary | ICD-10-CM | POA: Diagnosis not present

## 2021-01-23 DIAGNOSIS — R198 Other specified symptoms and signs involving the digestive system and abdomen: Secondary | ICD-10-CM

## 2021-01-23 DIAGNOSIS — Z882 Allergy status to sulfonamides status: Secondary | ICD-10-CM | POA: Insufficient documentation

## 2021-01-23 DIAGNOSIS — I899 Noninfective disorder of lymphatic vessels and lymph nodes, unspecified: Secondary | ICD-10-CM

## 2021-01-23 DIAGNOSIS — I2510 Atherosclerotic heart disease of native coronary artery without angina pectoris: Secondary | ICD-10-CM

## 2021-01-23 DIAGNOSIS — Z980 Intestinal bypass and anastomosis status: Secondary | ICD-10-CM | POA: Insufficient documentation

## 2021-01-23 HISTORY — PX: HEMOSTASIS CLIP PLACEMENT: SHX6857

## 2021-01-23 HISTORY — PX: EUS: SHX5427

## 2021-01-23 HISTORY — PX: SUBMUCOSAL LIFTING INJECTION: SHX6855

## 2021-01-23 HISTORY — PX: ESOPHAGOGASTRODUODENOSCOPY (EGD) WITH PROPOFOL: SHX5813

## 2021-01-23 HISTORY — PX: ENDOSCOPIC MUCOSAL RESECTION: SHX6839

## 2021-01-23 LAB — GLUCOSE, CAPILLARY: Glucose-Capillary: 96 mg/dL (ref 70–99)

## 2021-01-23 SURGERY — UPPER ENDOSCOPIC ULTRASOUND (EUS) RADIAL
Anesthesia: Monitor Anesthesia Care

## 2021-01-23 MED ORDER — LIDOCAINE HCL 1 % IJ SOLN
INTRAMUSCULAR | Status: DC | PRN
  Administered 2021-01-23: 80 mg via INTRADERMAL

## 2021-01-23 MED ORDER — CLOPIDOGREL BISULFATE 75 MG PO TABS: 75.0000 mg | ORAL_TABLET | Freq: Every day | ORAL | 1 refills | Status: AC

## 2021-01-23 MED ORDER — GLUCAGON HCL RDNA (DIAGNOSTIC) 1 MG IJ SOLR
INTRAMUSCULAR | Status: AC
  Filled 2021-01-23: qty 1

## 2021-01-23 MED ORDER — SODIUM CHLORIDE 0.9 % IV SOLN: INTRAVENOUS | Status: DC

## 2021-01-23 MED ORDER — GLYCOPYRROLATE PF 0.2 MG/ML IJ SOSY
PREFILLED_SYRINGE | INTRAMUSCULAR | Status: DC | PRN
  Administered 2021-01-23: .1 mg via INTRAVENOUS

## 2021-01-23 MED ORDER — GLUCAGON HCL RDNA (DIAGNOSTIC) 1 MG IJ SOLR
INTRAMUSCULAR | Status: DC | PRN
  Administered 2021-01-23: .25 mg via INTRAVENOUS

## 2021-01-23 MED ORDER — PROPOFOL 500 MG/50ML IV EMUL
INTRAVENOUS | Status: DC | PRN
  Administered 2021-01-23: 100 ug/kg/min via INTRAVENOUS

## 2021-01-23 MED ORDER — ONDANSETRON HCL 4 MG/2ML IJ SOLN
INTRAMUSCULAR | Status: DC | PRN
  Administered 2021-01-23: 4 mg via INTRAVENOUS

## 2021-01-23 MED ORDER — PROPOFOL 10 MG/ML IV BOLUS
INTRAVENOUS | Status: DC | PRN
  Administered 2021-01-23: 20 mg via INTRAVENOUS
  Administered 2021-01-23: 10 mg via INTRAVENOUS

## 2021-01-23 MED ORDER — ASPIRIN EC 81 MG PO TBEC: 81.0000 mg | DELAYED_RELEASE_TABLET | Freq: Every day | ORAL | 11 refills | Status: AC

## 2021-01-23 MED ORDER — LACTATED RINGERS IV SOLN: INTRAVENOUS | Status: DC

## 2021-01-23 MED ORDER — PROPOFOL 500 MG/50ML IV EMUL
INTRAVENOUS | Status: AC
  Filled 2021-01-23: qty 50

## 2021-01-23 MED ORDER — PROPOFOL 500 MG/50ML IV EMUL
INTRAVENOUS | Status: AC
  Filled 2021-01-23: qty 100

## 2021-01-23 NOTE — H&P (Signed)
GASTROENTEROLOGY PROCEDURE H&P NOTE   Primary Care Physician: Jearld Fenton, NP  HPI: Alicia [name] is a 75 y.o. female who presents for EGD/EUS/EMR of Duodenal SEL concerning for Duodenal NET.  Past Medical History:  Diagnosis Date  . Anemia   . Arthritis    "right knee" (10/21/2015)  . CAD (coronary artery disease)    a. Cath 10/21/15: s/p DES to RCA: moderate diffuse stenosis of mod diagonal, diffuse irregularity LCx and LAD. 2D echo 10/21/15: mod LVH, EF 60-65%, no RWMA, calcified MV, mod-severe LAE. 03/18/17 PCI with DES--> 1 diag  . Cataract   . Diabetes ([place])   . Essential hypertension   . Family history of adverse reaction to anesthesia    "oldest sister, [name], related to brain formation at back of head; when they put her to sleep it's hard to wake her up"  . GERD (gastroesophageal reflux disease)   . History of blood transfusion    "related to ruptured tubal pregnancy"  . Hyperlipidemia   . Hypertensive heart disease   . Morbid obesity ([place])   . Stroke (cerebrum) ([place])   . Tubular adenoma of colon   . Type II diabetes mellitus ([place])    Past Surgical History:  Procedure Laterality Date  . ABDOMINAL HYSTERECTOMY  1988  . APPENDECTOMY  1985  . [place] SURGERY  1984  . BIOPSY  07/18/2020   Procedure: BIOPSY;  Surgeon: [name], MD;  Location: [facility];  Service: Gastroenterology;;  . CARDIAC CATHETERIZATION N/A 10/21/2015   Procedure: Right/Left Heart Cath and Coronary Angiography;  Surgeon: [name], MD;  Location: [facility];  Service: Cardiovascular;  Laterality: N/A;  . CHOLECYSTECTOMY OPEN  1984  . COLONOSCOPY WITH PROPOFOL N/A 07/18/2020   Procedure: COLONOSCOPY WITH PROPOFOL;  Surgeon: [name], MD;  [facility]: [facility];  Service: Gastroenterology;  Laterality: N/A;  . CORONARY STENT INTERVENTION N/A 03/18/2017   Procedure: Coronary Stent Intervention;  Surgeon: [name], MD;  Location: [facility];  Service:  Cardiovascular;  Laterality: N/A;  . CORONARY STENT PLACEMENT  03/18/2017   A OPTIMIZE STUDY Drug Eluting Stent (2.5 mm 18 mm - post-dilated to 2.7 mm) was successfully placed  . ECTOPIC PREGNANCY SURGERY    . ESOPHAGOGASTRODUODENOSCOPY (EGD) WITH PROPOFOL N/A 07/18/2020   Procedure: ESOPHAGOGASTRODUODENOSCOPY (EGD) WITH PROPOFOL;  Surgeon: [name], MD;  Location: [facility];  Service: Gastroenterology;  Laterality: N/A;  . INTRAVASCULAR PRESSURE WIRE/FFR STUDY N/A 09/04/2019   Procedure: INTRAVASCULAR PRESSURE WIRE/FFR STUDY;  Surgeon: Nelva Bush, MD;  Location: [facility];  Service: Cardiovascular;  Laterality: N/A;  . LEFT HEART CATH AND CORONARY ANGIOGRAPHY N/A 03/18/2017   Procedure: Left Heart Cath and Coronary Angiography;  Surgeon: [name], MD;  Location: [facility];  Service: Cardiovascular;  Laterality: N/A;  . LEFT HEART CATH AND CORONARY ANGIOGRAPHY N/A 09/04/2019   Procedure: LEFT HEART CATH AND CORONARY ANGIOGRAPHY;  Surgeon: Nelva Bush, MD;  Location: [facility];  Service: Cardiovascular;  Laterality: N/A;  . POLYPECTOMY  07/18/2020   Procedure: POLYPECTOMY;  Surgeon: [name], MD;  Location: [facility];  Service: Gastroenterology;;  . REDUCTION MAMMAPLASTY Bilateral ~ 1976  . TONSILLECTOMY     Current Facility-Administered Medications  Medication Dose Route Frequency Provider Last Rate Last Admin  . 0.9 %  sodium chloride infusion   Intravenous Continuous Mansouraty, Telford Nab., MD      . lactated ringers infusion  Intravenous Continuous Mansouraty, Telford Nab., MD       Allergies  Allergen Reactions  . Iodinated Diagnostic Agents Hives  . Sulfa Antibiotics Hives   Family History  Problem Relation Age of Onset  . Diabetes Mother   . Heart attack Mother 65       2007  . Hypertension Mother   . Hyperlipidemia Mother   . Heart disease Mother   . Heart attack Father   . Stroke Father 56  . Hypertension Father    . Hyperlipidemia Father   . Stomach cancer Sister        in the cavity behind the stomach  . Diabetes Sister   . Diabetes Sister   . Diabetes Sister   . Hypertension Son        weight loss corrected that  . Migraines Daughter        brain surgery  . Migraines Daughter   . Breast cancer Neg Hx   . Colon cancer Neg Hx   . Inflammatory bowel disease Neg Hx   . Liver disease Neg Hx   . Esophageal cancer Neg Hx   . Pancreatic cancer Neg Hx   . Rectal cancer Neg Hx    Social History   Socioeconomic History  . Marital status: Divorced    Spouse name: Not on file  . Number of children: 3  . Years of education: doctorate  . Highest education level: Not on file  Occupational History  . Occupation: Teacher/retired  Tobacco Use  . Smoking status: Never Smoker  . Smokeless tobacco: Never Used  Vaping Use  . Vaping Use: Never used  Substance and Sexual Activity  . Alcohol use: No  . Drug use: No  . Sexual activity: Not Currently  Other Topics Concern  . Not on file  Social History Narrative   Caffeine use-yes   Regular exercise-no   Social Determinants of Health   Financial Resource Strain: Not on file  Food Insecurity: Not on file  Transportation Needs: Not on file  Physical Activity: Not on file  Stress: Not on file  Social Connections: Not on file  Intimate Partner Violence: Not on file    Physical Exam: Vital signs in last 24 hours:     GEN: NAD EYE: Sclerae anicteric ENT: MMM CV: Non-tachycardic GI: Soft, NT/ND NEURO:  Alert & Oriented x 3  Lab Results: No results for input(s): WBC, HGB, HCT, PLT in the last 72 hours. BMET No results for input(s): NA, K, CL, CO2, GLUCOSE, BUN, CREATININE, CALCIUM in the last 72 hours. LFT No results for input(s): PROT, ALBUMIN, AST, ALT, ALKPHOS, BILITOT, BILIDIR, IBILI in the last 72 hours. PT/INR No results for input(s): LABPROT, INR in the last 72 hours.   Impression / Plan: This is a 75 y.o.female who presents  for EGD/EUS/EMR of Duodenal SEL concerning for Duodenal NET.  The risks of an EUS including intestinal perforation, bleeding, infection, aspiration, and medication effects were discussed as was the possibility it may not give a definitive diagnosis if a biopsy is performed.  When a biopsy of the pancreas is done as part of the EUS, there is an additional risk of pancreatitis at the rate of about 1-2%.  It was explained that procedure related pancreatitis is typically mild, although it can be severe and even life threatening, which is why we do not perform random pancreatic biopsies and only biopsy a lesion/area we feel is concerning enough to warrant the risk.   The  risks and benefits of endoscopic evaluation were discussed with the patient; these include but are not limited to the risk of perforation, infection, bleeding, missed lesions, lack of diagnosis, severe illness requiring hospitalization, as well as anesthesia and sedation related illnesses.  The patient is agreeable to proceed as had been discussion in clinic with patient and her son.    Justice Britain, MD [facility] Endoscopy Office # [phone]

## 2021-01-23 NOTE — Op Note (Signed)
[facility] Patient Name: Alicia [name] Procedure Date: 01/23/2021 MRN: [id number] Attending MD: Justice Britain , MD Date of Birth: 28-Jan-1946 CSN: [id number] Age: 75 Admit Type: Outpatient Procedure:                Upper EUS Indications:              Duodenal mucosal mass/polyp found on endoscopy,                            Neuroendocrine Tumor rule out Providers:                Justice Britain, MD, Kary Kos RN, RN,                            Laverda Sorenson, Technician, Karis Juba, CRNA Referring MD:             Lajuan Lines. Hilarie Fredrickson, MD, Jearld Fenton Medicines:                Monitored Anesthesia Care Complications:            No immediate complications. Estimated Blood Loss:     Estimated blood loss was minimal. Procedure:                Pre-Anesthesia Assessment:                           - Prior to the procedure, a History and Physical                            was performed, and patient medications and                            allergies were reviewed. The patient's tolerance of                            previous anesthesia was also reviewed. The risks                            and benefits of the procedure and the sedation                            options and risks were discussed with the patient.                            All questions were answered, and informed consent                            was obtained. Prior Anticoagulants: The patient has                            taken Plavix (clopidogrel), last dose was 5 days                            prior to procedure. ASA Grade Assessment: III - A  patient with severe systemic disease. After                            reviewing the risks and benefits, the patient was                            deemed in satisfactory condition to undergo the                            procedure.                           After obtaining informed consent, the endoscope was                             passed under direct vision. Throughout the                            procedure, the patient's blood pressure, pulse, and                            oxygen saturations were monitored continuously. The                            [id number] ([id number]) Olympus therapeutic endoscope                            was introduced through the mouth, and advanced to                            the second part of duodenum. The [id number]                            ([id number]) Olympus Radial EUS was introduced through                            the mouth, and advanced to the duodenum for                            ultrasound examination from the stomach and                            duodenum. The upper EUS was technically difficult                            and complex due to inadequate patient positioning.                            Successful completion of the procedure was aided by                            performing the maneuvers documented (below) in this  report. The patient tolerated the procedure. Scope In: Scope Out: Findings:      ENDOSCOPIC FINDING: :      No gross lesions were noted in the entire esophagus.      The Z-line was irregular and was found 39 cm from the incisors.      Evidence of a previous surgical intervention - query a previous sleeve       with only partial gastric pouch vs other intervention was found in the       cardia. This was characterized by moderate stenosis at the distal aspect       of the cardia. This was not appreciated with the therapeutic EGD or       Radial EUS but when the Therapeutic EGD was replaced with the EMR cap,       this required gentle pressure to allow the scope to pass and caused a       mucosal wrent within the gastric pouch noted above. No perforation noted.      Patchy mildly erythematous mucosa without bleeding was found in the       entire examined stomach. Not biopsied as already biopsied previously.       A single 12 mm submucosal nodule was found in the duodenal bulb. This       was in a very difficult to maintain position as simple entry into the       duodenum led to the desire of the scope to reduce and required a very       long position at different points with signifciant lateral rotation of       the scope and the patient placed into a very significant left-lateral       position to try and get to the area. After the EUS was complete,       preparations were made for mucosal resection. NBI imaging and       White-light endoscopy was done to demarcate the borders of the lesion.       Orise gel was injected to raise the lesion. Band ligator and snare       mucosal resection was performed. Resection and retrieval were complete.       To prevent bleeding after mucosal resection, four hemostatic clips were       successfully placed (MR conditional). There was no bleeding during, or       at the end, of the procedure.      No other gross lesions were noted in the duodenal bulb, in the first       portion of the duodenum and in the second portion of the duodenum.      ENDOSONOGRAPHIC FINDING: :      A round intramural (subepithelial) lesion was found in the apex of the       duodenal bulb. The lesion was hypoechoic. Endosonographically, the       lesion appeared to originate from within the luminal       interface/superficial mucosa (Layer 1) and deep mucosa (Layer 2). The       lesion measured 12 mm (in maximum thickness). The lesion also measured       10 mm in diameter. The outer margins were smooth. There was sonographic       evidence suggesting invasion into the luminal interface/superficial       mucosa (Layer 1) and the deep mucosa (Layer 2).      No malignant-appearing lymph nodes  were visualized in the porta hepatis       region or within the peri-duodenal space. Impression:               EGD Impression:                           - No gross lesions in esophagus. Z-line irregular,                             39 cm from the incisors.                           - A previous surgical intervention - query                            mini-gastric pouch was found in the cardia,                            characterized by moderate stenosis at the distal                            aspect as noted above with mucosal wrent after                            Therapeutic EGD with EMR cap was in place                           - Erythematous mucosa in the stomach.                           - Submucosal nodule found in the duodenum bulb.                            After EUS, complete removal was accomplished via                            lift-band-ligation. Clips (MR conditional) were                            placed.                           - No other gross lesions in the duodenal bulb, in                            the first portion of the duodenum and in the second                            portion of the duodenum.                           EUS Impression:                           - An intramural (subepithelial) lesion was found at  the apex of the duodenal bulb. The lesion appeared                            to originate from within the luminal                            interface/superficial mucosa (Layer 1) and deep                            mucosa (Layer 2). Tissue has not been obtained.                            However, the endosonographic appearance is                            suspicious for a neuroendocrine tumor vs benign                            peptic nodular duodenitis.                           - No malignant-appearing lymph nodes were                            visualized in the porta hepatis region. Moderate Sedation:      Not Applicable - Patient had care per Anesthesia. Recommendation:           - The patient will be observed post-procedure,                            until all discharge criteria are met.                            - Discharge patient to home.                           - Patient has a contact number available for                            emergencies. The signs and symptoms of potential                            delayed complications were discussed with the                            patient. Return to normal activities tomorrow.                            Written discharge instructions were provided to the                            patient.                           - Full liquid diet today. Resume previous diet  tomorrow.                           - May restart Aspirin on 3/8.                           - May restart Plavix on 3/10 PM. If she takes                            Plavix in the AM, I would recommend restarting then                            on 3/11 AM so that patient can have at least 72                            hours off of significant anti-PLT therapy to                            decrease risk of post-interventional bleeding.                           - Observe patient's clinical course.                           - Await path results.                           - Continue twice daily PPI as you are already                            prescribed.                           - Repeat the upper endoscopic ultrasound for                            surveillance based on pathology results.                           - The findings and recommendations were discussed                            with the patient.                           - The findings and recommendations were discussed                            with the patient's family. Procedure Code(s):        --- Professional ---                           [id number], Esophagogastroduodenoscopy, flexible,                            transoral; with endoscopic mucosal resection  13643, Esophagogastroduodenoscopy, flexible,                            transoral; with endoscopic ultrasound  examination                            limited to the esophagus, stomach or duodenum, and                            adjacent structures Diagnosis Code(s):        --- Professional ---                           K22.8, Other specified diseases of esophagus                           Z98.0, Intestinal bypass and anastomosis status                           K31.89, Other diseases of stomach and duodenum                           I89.9, Noninfective disorder of lymphatic vessels                            and lymph nodes, unspecified CPT copyright 2019 American Medical Association. All rights reserved. The codes documented in this report are preliminary and upon coder review may  be revised to meet current compliance requirements. Justice Britain, MD 01/23/2021 4:01:10 PM Number of Addenda: 0

## 2021-01-23 NOTE — Transfer of Care (Signed)
Immediate Anesthesia Transfer of Care Note  Patient: Alicia [name]  Procedure(s) Performed: Procedure(s): UPPER ENDOSCOPIC ULTRASOUND (EUS) RADIAL (N/A) ENDOSCOPIC MUCOSAL RESECTION (N/A) SUBMUCOSAL LIFTING INJECTION HEMOSTASIS CLIP PLACEMENT  Patient Location: PACU and Endoscopy Unit  Anesthesia Type:MAC  Level of Consciousness: awake, alert  and oriented  Airway & Oxygen Therapy: Patient Spontanous Breathing and Patient connected to nasal cannula oxygen  Post-op Assessment: Report given to RN and Post -op Vital signs reviewed and stable  Post vital signs: Reviewed and stable  Last Vitals:  Vitals:   01/23/21 1325  BP: (!) 151/64  Resp: 15  Temp: 36.7 C  SpO2: 62%    Complications: No apparent anesthesia complications

## 2021-01-23 NOTE — Anesthesia Preprocedure Evaluation (Addendum)
Anesthesia Evaluation  Patient identified by MRN, date of birth, ID band Patient awake    Reviewed: Allergy & Precautions, NPO status , Patient's Chart, lab work & pertinent test results, reviewed documented beta blocker date and time   History of Anesthesia Complications (+) Family history of anesthesia reaction  Airway Mallampati: II  TM Distance: >3 FB Neck ROM: Full    Dental no notable dental hx.    Pulmonary sleep apnea and Continuous Positive Airway Pressure Ventilation ,    Pulmonary exam normal breath sounds clear to auscultation       Cardiovascular hypertension, Pt. on medications and Pt. on home beta blockers + angina with exertion + CAD and + Cardiac Stents  Normal cardiovascular exam Rhythm:Regular Rate:Normal  DES x 2, RCA and D1   Neuro/Psych S/P TPA CVA, No Residual Symptoms negative psych ROS   GI/Hepatic Neg liver ROS, GERD  Medicated and Controlled,Abnormal EGD Duodenal polyp   Endo/Other  diabetes, Well Controlled, Type 2, Oral Hypoglycemic AgentsMorbid obesityHyperlipidemia  Renal/GU negative Renal ROS  negative genitourinary   Musculoskeletal  (+) Arthritis , Osteoarthritis,  Right knee   Abdominal (+) + obese,   Peds  Hematology  (+) anemia , Plavix therapy- last dose 01/18/21   Anesthesia Other Findings   Reproductive/Obstetrics                            Anesthesia Physical Anesthesia Plan  ASA: III  Anesthesia Plan: MAC   Post-op Pain Management:    Induction: Intravenous  PONV Risk Score and Plan: 3 and Treatment may vary due to age or medical condition, Propofol infusion and Ondansetron  Airway Management Planned: Natural Airway and Nasal Cannula  Additional Equipment:   Intra-op Plan:   Post-operative Plan:   Informed Consent: I have reviewed the patients History and Physical, chart, labs and discussed the procedure including the risks, benefits  and alternatives for the proposed anesthesia with the patient or authorized representative who has indicated his/her understanding and acceptance.     Dental advisory given  Plan Discussed with: CRNA and Anesthesiologist  Anesthesia Plan Comments:         Anesthesia Quick Evaluation

## 2021-01-23 NOTE — Discharge Instructions (Signed)
YOU HAD AN ENDOSCOPIC PROCEDURE TODAY: Refer to the procedure report and other information in the discharge instructions given to you for any specific questions about what was found during the examination. If this information does not answer your questions, please call [place] office at [phone] to clarify.  ° °YOU SHOULD EXPECT: Some feelings of bloating in the abdomen. Passage of more gas than usual. Walking can help get rid of the air that was put into your GI tract during the procedure and reduce the bloating. If you had a lower endoscopy (such as a colonoscopy or flexible sigmoidoscopy) you may notice spotting of blood in your stool or on the toilet paper. Some abdominal soreness may be present for a day or two, also. ° °DIET: Your first meal following the procedure should be a light meal and then it is ok to progress to your normal diet. A half-sandwich or bowl of soup is an example of a good first meal. Heavy or fried foods are harder to digest and may make you feel nauseous or bloated. Drink plenty of fluids but you should avoid alcoholic beverages for 24 hours. If you had a esophageal dilation, please see attached instructions for diet.   ° °ACTIVITY: Your care partner should take you home directly after the procedure. You should plan to take it easy, moving slowly for the rest of the day. You can resume normal activity the day after the procedure however YOU SHOULD NOT DRIVE, use power tools, machinery or perform tasks that involve climbing or major physical exertion for 24 hours (because of the sedation medicines used during the test).  ° °SYMPTOMS TO REPORT IMMEDIATELY: °A gastroenterologist can be reached at any hour. Please call [phone]  for any of the following symptoms:  °Following lower endoscopy (colonoscopy, flexible sigmoidoscopy) °Excessive amounts of blood in the stool  °Significant tenderness, worsening of abdominal pains  °Swelling of the abdomen that is new, acute  °Fever of 100° or  higher  °Following upper endoscopy (EGD, EUS, ERCP, esophageal dilation) °Vomiting of blood or coffee ground material  °New, significant abdominal pain  °New, significant chest pain or pain under the shoulder blades  °Painful or persistently difficult swallowing  °New shortness of breath  °Black, tarry-looking or red, bloody stools ° °FOLLOW UP:  °If any biopsies were taken you will be contacted by phone or by letter within the next 1-3 weeks. Call [phone]  if you have not heard about the biopsies in 3 weeks.  °Please also call with any specific questions about appointments or follow up tests. ° °

## 2021-01-23 NOTE — Anesthesia Postprocedure Evaluation (Signed)
Anesthesia Post Note  Patient: Alicia [name]  Procedure(s) Performed: UPPER ENDOSCOPIC ULTRASOUND (EUS) RADIAL (N/A ) ENDOSCOPIC MUCOSAL RESECTION (N/A ) SUBMUCOSAL LIFTING INJECTION HEMOSTASIS CLIP PLACEMENT     Patient location during evaluation: PACU Anesthesia Type: MAC Level of consciousness: awake and alert Pain management: pain level controlled Vital Signs Assessment: post-procedure vital signs reviewed and stable Respiratory status: spontaneous breathing, nonlabored ventilation and respiratory function stable Cardiovascular status: blood pressure returned to baseline and stable Postop Assessment: no apparent nausea or vomiting Anesthetic complications: no   No complications documented.  Last Vitals:  Vitals:   01/23/21 1620 01/23/21 1630  BP: (!) 198/96 114/78  Pulse: 73 73  Resp: 15 16  Temp:    SpO2: 97% 100%    Last Pain:  Vitals:   01/23/21 1630  TempSrc:   PainSc: 0-No pain                 Pervis Hocking

## 2021-01-24 ENCOUNTER — Encounter (HOSPITAL_COMMUNITY): Payer: Self-pay | Admitting: Gastroenterology

## 2021-01-24 NOTE — Telephone Encounter (Signed)
Alicia [name] is calling in regards to this previous message. I advised her it has been routed to [facility] and Dr. Harrington Challenger. She requested a callback once a response is received. Her best callback number is [phone]. Please advise.

## 2021-01-25 LAB — SURGICAL PATHOLOGY

## 2021-01-28 ENCOUNTER — Other Ambulatory Visit: Payer: Self-pay | Admitting: Internal Medicine

## 2021-01-31 ENCOUNTER — Encounter: Payer: Self-pay | Admitting: Gastroenterology

## 2021-02-10 ENCOUNTER — Other Ambulatory Visit: Payer: Self-pay

## 2021-02-10 ENCOUNTER — Other Ambulatory Visit: Payer: Medicare PPO | Admitting: *Deleted

## 2021-02-10 DIAGNOSIS — E559 Vitamin D deficiency, unspecified: Secondary | ICD-10-CM

## 2021-02-10 DIAGNOSIS — I10 Essential (primary) hypertension: Secondary | ICD-10-CM

## 2021-02-11 LAB — VITAMIN D 25 HYDROXY (VIT D DEFICIENCY, FRACTURES): Vit D, 25-Hydroxy: 38.3 ng/mL (ref 30.0–100.0)

## 2021-02-16 ENCOUNTER — Other Ambulatory Visit: Payer: Self-pay | Admitting: *Deleted

## 2021-02-16 MED ORDER — CHOLECALCIFEROL 50 MCG (2000 UT) PO TABS: ORAL_TABLET | ORAL | Status: DC

## 2021-02-24 ENCOUNTER — Other Ambulatory Visit: Payer: Self-pay | Admitting: Internal Medicine

## 2021-03-29 ENCOUNTER — Ambulatory Visit: Payer: Medicare PPO | Admitting: Family Medicine

## 2021-03-29 ENCOUNTER — Encounter: Payer: Medicare PPO | Admitting: Internal Medicine

## 2021-03-29 ENCOUNTER — Other Ambulatory Visit: Payer: Self-pay

## 2021-03-29 VITALS — BP 100/60 | HR 75 | Temp 97.2°F | Ht 65.0 in | Wt 305.0 lb

## 2021-03-29 DIAGNOSIS — E2839 Other primary ovarian failure: Secondary | ICD-10-CM

## 2021-03-29 DIAGNOSIS — E119 Type 2 diabetes mellitus without complications: Secondary | ICD-10-CM | POA: Diagnosis not present

## 2021-03-29 DIAGNOSIS — I10 Essential (primary) hypertension: Secondary | ICD-10-CM

## 2021-03-29 DIAGNOSIS — E785 Hyperlipidemia, unspecified: Secondary | ICD-10-CM

## 2021-03-29 DIAGNOSIS — D509 Iron deficiency anemia, unspecified: Secondary | ICD-10-CM | POA: Diagnosis not present

## 2021-03-29 DIAGNOSIS — R252 Cramp and spasm: Secondary | ICD-10-CM | POA: Diagnosis not present

## 2021-03-29 LAB — POCT GLYCOSYLATED HEMOGLOBIN (HGB A1C): Hemoglobin A1C: 5.9 % — AB (ref 4.0–5.6)

## 2021-03-29 LAB — CBC
HCT: 29 % — ABNORMAL LOW (ref 36.0–46.0)
Hemoglobin: 9.2 g/dL — ABNORMAL LOW (ref 12.0–15.0)
MCHC: 31.8 g/dL (ref 30.0–36.0)
MCV: 68.6 fl — ABNORMAL LOW (ref 78.0–100.0)
Platelets: 251 10*3/uL (ref 150.0–400.0)
RBC: 4.22 Mil/uL (ref 3.87–5.11)
RDW: 17.6 % — ABNORMAL HIGH (ref 11.5–15.5)
WBC: 5.6 10*3/uL (ref 4.0–10.5)

## 2021-03-29 LAB — COMPREHENSIVE METABOLIC PANEL
ALT: 9 U/L (ref 0–35)
AST: 15 U/L (ref 0–37)
Albumin: 3.9 g/dL (ref 3.5–5.2)
Alkaline Phosphatase: 99 U/L (ref 39–117)
BUN: 33 mg/dL — ABNORMAL HIGH (ref 6–23)
CO2: 26 mEq/L (ref 19–32)
Calcium: 8.9 mg/dL (ref 8.4–10.5)
Chloride: 107 mEq/L (ref 96–112)
Creatinine, Ser: 2.18 mg/dL — ABNORMAL HIGH (ref 0.40–1.20)
GFR: 21.94 mL/min — ABNORMAL LOW (ref >60.00)
Glucose, Bld: 74 mg/dL (ref 70–99)
Potassium: 4.2 mEq/L (ref 3.5–5.1)
Sodium: 143 mEq/L (ref 135–145)
Total Bilirubin: 0.4 mg/dL (ref 0.2–1.2)
Total Protein: 6.4 g/dL (ref 6.0–8.3)

## 2021-03-29 LAB — LIPID PANEL
Cholesterol: 116 mg/dL (ref 0–200)
HDL: 44 mg/dL (ref >39.00)
LDL Cholesterol: 58 mg/dL (ref 0–99)
NonHDL: 71.56
Total CHOL/HDL Ratio: 3
Triglycerides: 67 mg/dL (ref 0.0–149.0)
VLDL: 13.4 mg/dL (ref 0.0–40.0)

## 2021-03-29 LAB — FERRITIN: Ferritin: 56.6 ng/mL (ref 10.0–291.0)

## 2021-03-29 MED ORDER — GLIPIZIDE 10 MG PO TABS: 10.0000 mg | ORAL_TABLET | Freq: Two times a day (BID) | ORAL | 1 refills | Status: DC

## 2021-03-29 MED ORDER — LINAGLIPTIN 5 MG PO TABS: ORAL_TABLET | ORAL | 1 refills | Status: DC

## 2021-03-29 MED ORDER — METFORMIN HCL 1000 MG PO TABS: 1000.0000 mg | ORAL_TABLET | Freq: Two times a day (BID) | ORAL | 2 refills | Status: DC

## 2021-03-29 NOTE — Assessment & Plan Note (Addendum)
Well controlled. Follows with Dr. Harrington Challenger w/ cardiology. Cont spironolactone, losartan 50 mg, lasix 20 mg, carvedilol 25 mg, amlodipine 10 mg, isosorbide mononitrate 60 mg

## 2021-03-29 NOTE — Progress Notes (Signed)
Subjective:     Alicia [name] is a 75 y.o. female presenting for Follow-up (DM )     HPI   #Diabetes Currently taking - glipizide 10 mg bid, metformin 1000 mg bid, januvia 100 mg and tradjenta 5 mg Using medications without difficulties: No Hypoglycemic episodes:No  - did have some 60s Hyperglycemic episodes:no Feet problems:Yes  Blood Sugars averaging: 118-128 Last HgbA1c:  Lab Results  Component Value Date   HGBA1C 5.9 (A) 03/29/2021    Diabetes Health Maintenance Due:    Diabetes Health Maintenance Due  Topic Date Due  . OPHTHALMOLOGY EXAM  02/17/2018  . HEMOGLOBIN A1C  09/29/2021  . FOOT EXAM  03/29/2022   Diet: doing well with diabetic diet  #Leg cramps - in the middle of the night will wake up and feels like someone is digging in the legs with a knife  - does not do stretches -     Review of Systems   Social History   Tobacco Use  Smoking Status Never Smoker  Smokeless Tobacco Never Used        Objective:    BP Readings from Last 3 Encounters:  03/29/21 100/60  01/23/21 114/78  12/12/20 122/64   Wt Readings from Last 3 Encounters:  03/29/21 (!) 305 lb (138.3 kg)  01/23/21 (!) 306 lb (138.8 kg)  12/12/20 (!) 309 lb 3.2 oz (140.3 kg)    BP 100/60   Pulse 75   Temp (!) 97.2 F (36.2 C) (Temporal)   Ht 5\' 5"  (1.651 m)   Wt (!) 305 lb (138.3 kg)   SpO2 99%   BMI 50.75 kg/m    Physical Exam Constitutional:      General: She is not in acute distress.    Appearance: She is well-developed. She is not diaphoretic.  HENT:     Right Ear: External ear normal.     Left Ear: External ear normal.  Eyes:     Conjunctiva/sclera: Conjunctivae normal.  Cardiovascular:     Rate and Rhythm: Normal rate and regular rhythm.  Pulmonary:     Effort: Pulmonary effort is normal. No respiratory distress.     Breath sounds: Normal breath sounds. No wheezing.  Musculoskeletal:     Cervical back: Neck supple.     Comments: LE - no swelling or  edema Some TTP to palpation on the right LE No ttp on the left LE  Skin:    General: Skin is warm and dry.     Capillary Refill: Capillary refill takes less than 2 seconds.  Neurological:     Mental Status: She is alert. Mental status is at baseline.  Psychiatric:        Mood and Affect: Mood normal.        Behavior: Behavior normal.    Diabetic Foot Exam - Simple   Simple Foot Form Diabetic Foot exam was performed with the following findings: Yes 03/29/2021 10:19 AM  Visual Inspection No deformities, no ulcerations, no other skin breakdown bilaterally: Yes Sensation Testing Intact to touch and monofilament testing bilaterally: Yes Pulse Check Posterior Tibialis and Dorsalis pulse intact bilaterally: Yes Comments          Assessment & Plan:   Problem List Items Addressed This Visit      Cardiovascular and Mediastinum   Essential hypertension    Well controlled. Follows with Dr. Harrington Challenger w/ cardiology. Cont spironolactone, losartan 50 mg, lasix 20 mg, carvedilol 25 mg, amlodipine 10 mg, isosorbide mononitrate 60 mg  Relevant Orders   Comprehensive metabolic panel     Endocrine   Type 2 diabetes mellitus without complication, without long-term current use of insulin ([place]) - Primary    Lab Results  Component Value Date   HGBA1C 5.9 (A) 03/29/2021   Well controlled. Pt prescribed both januvia and tradjenta. With good control will stop Januvia as more expensive. Cont metformin 1000 mg bid, glpizide 10 mg bid, and tradjenta 5 mg      Relevant Medications   glipiZIDE (GLUCOTROL) 10 MG tablet   linagliptin (TRADJENTA) 5 MG TABS tablet   metFORMIN (GLUCOPHAGE) 1000 MG tablet   Other Relevant Orders   POCT glycosylated hemoglobin (Hb A1C) (Completed)   Comprehensive metabolic panel     Other   Hyperlipidemia    Lab Results  Component Value Date   LDLCALC 41 12/12/2020   At goal. Follows with cardiology. Cont evolocumab q14 days and atorvastatin 80 mg       Relevant Orders   Lipid panel   Microcytic anemia    Unclear if this is impacting her hemoglobin a1c. Will repeat labs, pt on iron supplement.       Relevant Orders   CBC   Leg cramping    Some ttp on exam. Encouraged stretching and hydration. Labs today to assess causes. Return if worsening/not improving      Relevant Orders   Ferritin    Other Visit Diagnoses    Estrogen deficiency       Relevant Orders   DG Bone Density       Return in about 3 months (around 06/29/2021) for diabetes.  Lesleigh Noe, MD  This visit occurred during the SARS-CoV-2 public health emergency.  Safety protocols were in place, including screening questions prior to the visit, additional usage of staff PPE, and extensive cleaning of exam room while observing appropriate contact time as indicated for disinfecting solutions.

## 2021-03-29 NOTE — Assessment & Plan Note (Signed)
Lab Results  Component Value Date   HGBA1C 5.9 (A) 03/29/2021   Well controlled. Pt prescribed both januvia and tradjenta. With good control will stop Januvia as more expensive. Cont metformin 1000 mg bid, glpizide 10 mg bid, and tradjenta 5 mg

## 2021-03-29 NOTE — Assessment & Plan Note (Signed)
Lab Results  Component Value Date   [place] 41 12/12/2020   At goal. Follows with cardiology. Cont evolocumab q14 days and atorvastatin 80 mg

## 2021-03-29 NOTE — Patient Instructions (Addendum)
#  Leg pain - labs today - start doing stretches before bed - try to drink another 8 oz of water   #Diabetes - this is looking GREAT!!! - Stop Januvia - continue your other medication   Lab Results  Component Value Date   HGBA1C 5.9 (A) 03/29/2021   Please call the location of your choice from the menu below to schedule your Mammogram and/or Bone Density appointment.    [place]   [facility] [facility]                      Phone:  [id number]                               [place], [place] [address]                                                             Services: Traditional and 3D Mammogram, Bone Density

## 2021-03-29 NOTE — Assessment & Plan Note (Signed)
Unclear if this is impacting her hemoglobin a1c. Will repeat labs, pt on iron supplement.

## 2021-03-29 NOTE — Assessment & Plan Note (Signed)
Some ttp on exam. Encouraged stretching and hydration. Labs today to assess causes. Return if worsening/not improving

## 2021-03-30 ENCOUNTER — Telehealth: Payer: Self-pay | Admitting: Physician Assistant

## 2021-03-30 ENCOUNTER — Other Ambulatory Visit: Payer: Self-pay | Admitting: Family Medicine

## 2021-03-30 DIAGNOSIS — D509 Iron deficiency anemia, unspecified: Secondary | ICD-10-CM

## 2021-03-30 DIAGNOSIS — N184 Chronic kidney disease, stage 4 (severe): Secondary | ICD-10-CM

## 2021-03-30 NOTE — Telephone Encounter (Signed)
Received a new hem referral from Dr. Einar Pheasant for microcytic anemia. Ms. Alicia [name] has been cld and scheduled to see Murray Hodgkins on 5/17 at 2pm. Pt aware to arrive 20 minutes early.

## 2021-04-02 ENCOUNTER — Other Ambulatory Visit: Payer: Self-pay | Admitting: Internal Medicine

## 2021-04-02 DIAGNOSIS — E119 Type 2 diabetes mellitus without complications: Secondary | ICD-10-CM

## 2021-04-04 ENCOUNTER — Encounter: Payer: Self-pay | Admitting: Physician Assistant

## 2021-04-04 ENCOUNTER — Other Ambulatory Visit: Payer: Self-pay

## 2021-04-04 ENCOUNTER — Inpatient Hospital Stay: Payer: Medicare PPO

## 2021-04-04 ENCOUNTER — Inpatient Hospital Stay: Payer: Medicare PPO | Attending: Physician Assistant | Admitting: Physician Assistant

## 2021-04-04 VITALS — BP 148/72 | HR 82 | Temp 98.1°F | Resp 19 | Wt 307.5 lb

## 2021-04-04 DIAGNOSIS — I2510 Atherosclerotic heart disease of native coronary artery without angina pectoris: Secondary | ICD-10-CM | POA: Insufficient documentation

## 2021-04-04 DIAGNOSIS — Z7982 Long term (current) use of aspirin: Secondary | ICD-10-CM | POA: Insufficient documentation

## 2021-04-04 DIAGNOSIS — E119 Type 2 diabetes mellitus without complications: Secondary | ICD-10-CM | POA: Insufficient documentation

## 2021-04-04 DIAGNOSIS — Z955 Presence of coronary angioplasty implant and graft: Secondary | ICD-10-CM | POA: Insufficient documentation

## 2021-04-04 DIAGNOSIS — E785 Hyperlipidemia, unspecified: Secondary | ICD-10-CM | POA: Insufficient documentation

## 2021-04-04 DIAGNOSIS — E538 Deficiency of other specified B group vitamins: Secondary | ICD-10-CM | POA: Diagnosis not present

## 2021-04-04 DIAGNOSIS — D509 Iron deficiency anemia, unspecified: Secondary | ICD-10-CM | POA: Diagnosis not present

## 2021-04-04 DIAGNOSIS — Z79899 Other long term (current) drug therapy: Secondary | ICD-10-CM | POA: Insufficient documentation

## 2021-04-04 DIAGNOSIS — D508 Other iron deficiency anemias: Secondary | ICD-10-CM

## 2021-04-04 DIAGNOSIS — I10 Essential (primary) hypertension: Secondary | ICD-10-CM | POA: Insufficient documentation

## 2021-04-04 DIAGNOSIS — Z9884 Bariatric surgery status: Secondary | ICD-10-CM | POA: Diagnosis not present

## 2021-04-04 DIAGNOSIS — Z7984 Long term (current) use of oral hypoglycemic drugs: Secondary | ICD-10-CM | POA: Diagnosis not present

## 2021-04-04 LAB — CBC WITH DIFFERENTIAL (CANCER CENTER ONLY)
Abs Immature Granulocytes: 0.01 10*3/uL (ref 0.00–0.07)
Basophils Absolute: 0 10*3/uL (ref 0.0–0.1)
Basophils Relative: 0 %
Eosinophils Absolute: 0.2 10*3/uL (ref 0.0–0.5)
Eosinophils Relative: 3 %
HCT: 28.6 % — ABNORMAL LOW (ref 36.0–46.0)
Hemoglobin: 8.5 g/dL — ABNORMAL LOW (ref 12.0–15.0)
Immature Granulocytes: 0 %
Lymphocytes Relative: 31 %
Lymphs Abs: 1.8 10*3/uL (ref 0.7–4.0)
MCH: 21.7 pg — ABNORMAL LOW (ref 26.0–34.0)
MCHC: 29.7 g/dL — ABNORMAL LOW (ref 30.0–36.0)
MCV: 73 fL — ABNORMAL LOW (ref 80.0–100.0)
Monocytes Absolute: 0.3 10*3/uL (ref 0.1–1.0)
Monocytes Relative: 6 %
Neutro Abs: 3.5 10*3/uL (ref 1.7–7.7)
Neutrophils Relative %: 60 %
Platelet Count: 260 10*3/uL (ref 150–400)
RBC: 3.92 MIL/uL (ref 3.87–5.11)
RDW: 17.1 % — ABNORMAL HIGH (ref 11.5–15.5)
WBC Count: 5.8 10*3/uL (ref 4.0–10.5)
nRBC: 0 % (ref 0.0–0.2)

## 2021-04-04 LAB — RETIC PANEL
Immature Retic Fract: 20.5 % — ABNORMAL HIGH (ref 2.3–15.9)
RBC.: 3.89 MIL/uL (ref 3.87–5.11)
Retic Count, Absolute: 66.1 10*3/uL (ref 19.0–186.0)
Retic Ct Pct: 1.7 % (ref 0.4–3.1)
Reticulocyte Hemoglobin: 24.3 pg — ABNORMAL LOW (ref >27.9)

## 2021-04-04 LAB — IRON AND TIBC
Iron: 46 ug/dL (ref 41–142)
Saturation Ratios: 18 % — ABNORMAL LOW (ref 21–57)
TIBC: 263 ug/dL (ref 236–444)
UIBC: 216 ug/dL (ref 120–384)

## 2021-04-04 LAB — VITAMIN B12: Vitamin B-12: 3363 pg/mL — ABNORMAL HIGH (ref 180–914)

## 2021-04-04 LAB — FERRITIN: Ferritin: 69 ng/mL (ref 11–307)

## 2021-04-04 NOTE — Progress Notes (Signed)
[place] [phone]   Fax:([phone]) [place] NOTE  Patient Care Team: Jearld Fenton, NP as PCP - General (Internal Medicine) [name], MD as PCP - Cardiology (Cardiology) [name], MD as Consulting Physician (Cardiology)  Hematological/Oncological History #Microcytic Anemia: -07/18/2020: Colonoscopy: one 5 mm polyp in descending colon. Diverticulosis in sigmoid colon and ascending colon.   -11/22/2020: WBC 4.5, Hgb 10.1 (L), MCV 69.6, Plt 235  -11/30/2020: Vitamin B12 186 (L), Retic Ct Pct 1.9%, Abs Retic 87,780 (H), Iron 62, Transferrin 219, iron saturation 20.2% (L), Ferritin 70.4, Folate 12.3. Started vitamin B12 2000 mcg per day.   -01/23/2021:EGD/EUS: Previous surgical intervention-query mini-gastric pouch found in the cardia. Submucosal nodule in the duodenum bulb removed via EMR. Pathology was consistent with polypoid dudoenal tissue showing nodular, mature adipose tissue within the submucosa, consistent with a lipoma. Negative for dysplasia or malignancy.  -03/29/2021: WBC 5.6, Hgb 9.2 (L), MCV 68.6 (L), Plt 251, Ferritin 56.6  -04/04/2021: Establish care with Dede Query PA-C   CHIEF COMPLAINTS/PURPOSE OF CONSULTATION:  "Microcytic anemia"  HISTORY OF PRESENTING ILLNESS:  Alicia [name] 75 y.o. female with medical history significant for hypertension, T2DM, hyperlipidemia, coronary artery disease s/p coronary stent placement, obesity s/p bariatric surgery, and arthritis.   On review of the previous records, Alicia [name] had microcytic anemia as far back as July 2008. Patient has been on daily iron tablets since 2017-2018. Due to vitamin B12 deficiency in January 2022, patient started to take 2000 mcg of vitamin B12 tablets daily. Patient underwent colonoscopy in August 2021 without any evidence of bleeding. More recent, patient underwent EGD/EUS in March 2022 that revealed submucosal nodule in the duodenum that was consistent with  a lipoma and findings with previous bariatric surgery.   On exam today, Alicia [name] reports chronic fatigue for several years although she is able to complete her ADLs on her own. She has a good appetite without any noticeable weight changes. Patient denies any nausea, vomiting or abdominal pain. Patient has chronic lower extremity pain but denies any interference to her ambulation. She notes left greater than right lower extremity edema that improves with elevation. Patient has regular bowel movements without any constipation or diarrhea. Patient denies easy bruising or signs of bleeding. This includes hematochezia, melena, hematuria, epistaxis, hemoptysis or gingival bleeding. Patient denies any fevers, chills, night sweats, shortness of breath, chest pain or cough. She has no other complaints. Rest of the 10 point ROS is below.   MEDICAL HISTORY:  Past Medical History:  Diagnosis Date  . Anemia   . Arthritis    "right knee" (10/21/2015)  . CAD (coronary artery disease)    a. Cath 10/21/15: s/p DES to RCA: moderate diffuse stenosis of mod diagonal, diffuse irregularity LCx and LAD. 2D echo 10/21/15: mod LVH, EF 60-65%, no RWMA, calcified MV, mod-severe LAE. 03/18/17 PCI with DES--> 1 diag  . Cataract   . Diabetes ([place])   . Essential hypertension   . Family history of adverse reaction to anesthesia    "oldest sister, [name], related to brain formation at back of head; when they put her to sleep it's hard to wake her up"  . GERD (gastroesophageal reflux disease)   . History of blood transfusion    "related to ruptured tubal pregnancy"  . Hyperlipidemia   . Hypertensive heart disease   . Morbid obesity ([place])   . Stroke (cerebrum) ([place])   . Tubular adenoma of colon   .  Type II diabetes mellitus ([place])     SURGICAL HISTORY: Past Surgical History:  Procedure Laterality Date  . ABDOMINAL HYSTERECTOMY  1988   fibroid  . APPENDECTOMY  1985  . [place] SURGERY  1984  . BIOPSY  07/18/2020    Procedure: BIOPSY;  Surgeon: [name], MD;  Location: [facility];  Service: Gastroenterology;;  . CARDIAC CATHETERIZATION N/A 10/21/2015   Procedure: Right/Left Heart Cath and Coronary Angiography;  Surgeon: [name], MD;  Location: [facility];  Service: Cardiovascular;  Laterality: N/A;  . CHOLECYSTECTOMY OPEN  1984  . COLONOSCOPY WITH PROPOFOL N/A 07/18/2020   Procedure: COLONOSCOPY WITH PROPOFOL;  Surgeon: [name], MD;  Location: [facility];  Service: Gastroenterology;  Laterality: N/A;  . CORONARY STENT INTERVENTION N/A 03/18/2017   Procedure: Coronary Stent Intervention;  Surgeon: [name], MD;  Location: [facility];  Service: Cardiovascular;  Laterality: N/A;  . CORONARY STENT PLACEMENT  03/18/2017   A OPTIMIZE STUDY Drug Eluting Stent (2.5 mm 18 mm - post-dilated to 2.7 mm) was successfully placed  . ECTOPIC PREGNANCY SURGERY    . ENDOSCOPIC MUCOSAL RESECTION N/A 01/23/2021   Procedure: ENDOSCOPIC MUCOSAL RESECTION;  Surgeon: Rush Landmark Telford Nab., MD;  Location: [facility];  Service: Gastroenterology;  Laterality: N/A;  . ESOPHAGOGASTRODUODENOSCOPY (EGD) WITH PROPOFOL N/A 07/18/2020   Procedure: ESOPHAGOGASTRODUODENOSCOPY (EGD) WITH PROPOFOL;  Surgeon: [name], MD;  Location: [facility];  Service: Gastroenterology;  Laterality: N/A;  . ESOPHAGOGASTRODUODENOSCOPY (EGD) WITH PROPOFOL N/A 01/23/2021   Procedure: ESOPHAGOGASTRODUODENOSCOPY (EGD) WITH PROPOFOL;  Surgeon: Rush Landmark Telford Nab., MD;  Location: [facility];  Service: Gastroenterology;  Laterality: N/A;  . EUS N/A 01/23/2021   Procedure: UPPER ENDOSCOPIC ULTRASOUND (EUS) RADIAL;  Surgeon: Rush Landmark Telford Nab., MD;  Location: [facility];  Service: Gastroenterology;  Laterality: N/A;  . HEMOSTASIS CLIP PLACEMENT  01/23/2021   Procedure: HEMOSTASIS CLIP PLACEMENT;  Surgeon: Irving Copas., MD;  Location: [facility];  Service: Gastroenterology;;  . Cruzita Lederer PRESSURE  WIRE/FFR STUDY N/A 09/04/2019   Procedure: INTRAVASCULAR PRESSURE WIRE/FFR STUDY;  Surgeon: Nelva Bush, MD;  Location: [place] [facility];  Service: Cardiovascular;  Laterality: N/A;  . LEFT HEART CATH AND CORONARY ANGIOGRAPHY N/A 03/18/2017   Procedure: Left Heart Cath and Coronary Angiography;  Surgeon: [name], MD;  Location: [place] [place] [facility];  Service: Cardiovascular;  Laterality: N/A;  . LEFT HEART CATH AND CORONARY ANGIOGRAPHY N/A 09/04/2019   Procedure: LEFT HEART CATH AND CORONARY ANGIOGRAPHY;  Surgeon: Nelva Bush, MD;  Location: [facility] [facility];  Service: Cardiovascular;  Laterality: N/A;  . POLYPECTOMY  07/18/2020   Procedure: POLYPECTOMY;  Surgeon: [name], MD;  Location: [facility];  Service: Gastroenterology;;  . REDUCTION MAMMAPLASTY Bilateral ~ 1976  . SUBMUCOSAL LIFTING INJECTION  01/23/2021   Procedure: SUBMUCOSAL LIFTING INJECTION;  Surgeon: Rush Landmark Telford Nab., MD;  Location: [facility];  Service: Gastroenterology;;  . TONSILLECTOMY      SOCIAL HISTORY: Social History   Socioeconomic History  . Marital status: Divorced    Spouse name: Not on file  . Number of children: 3  . Years of education: doctorate  . Highest education level: Not on file  Occupational History  . Occupation: Teacher/retired  Tobacco Use  . Smoking status: Never Smoker  . Smokeless tobacco: Never Used  Vaping Use  . Vaping Use: Never used  Substance and Sexual Activity  . Alcohol use: No  . Drug use: No  . Sexual activity: Not  Currently  Other Topics Concern  . Not on file  Social History Narrative   Caffeine use-yes   Regular exercise-no   Social Determinants of Health   Financial Resource Strain: Not on file  Food Insecurity: Not on file  Transportation Needs: Not on file  Physical Activity: Not on file  Stress: Not on file  Social Connections: Not on file  Intimate Partner Violence: Not on file    FAMILY HISTORY: Family History  Problem  Relation Age of Onset  . Diabetes Mother   . Heart attack Mother 41       2007  . Hypertension Mother   . Hyperlipidemia Mother   . Heart disease Mother   . Heart attack Father   . Stroke Father 21  . Hypertension Father   . Hyperlipidemia Father   . Stomach cancer Sister        in the cavity behind the stomach  . Diabetes Sister   . Diabetes Sister   . Diabetes Sister   . Hypertension Son        weight loss corrected that  . Migraines Daughter        brain surgery  . Migraines Daughter   . Breast cancer Neg Hx   . Colon cancer Neg Hx   . Inflammatory bowel disease Neg Hx   . Liver disease Neg Hx   . Esophageal cancer Neg Hx   . Pancreatic cancer Neg Hx   . Rectal cancer Neg Hx     ALLERGIES:  is allergic to iodinated diagnostic agents and sulfa antibiotics.  MEDICATIONS:  Current Outpatient Medications  Medication Sig Dispense Refill  . amLODipine (NORVASC) 10 MG tablet TAKE 1 TABLET BY MOUTH EVERY DAY (Patient taking differently: Take 10 mg by mouth at bedtime.) 90 tablet 1  . aspirin EC 81 MG tablet Take 1 tablet (81 mg total) by mouth daily. Swallow whole. 30 tablet 11  . atorvastatin (LIPITOR) 80 MG tablet TAKE 1 TABLET BY MOUTH DAILY AT 6 PM. (Patient taking differently: Take 80 mg by mouth at bedtime.) 90 tablet 3  . carvedilol (COREG) 25 MG tablet TAKE 2 TABLETS (50 MG TOTAL) BY MOUTH 2 (TWO) TIMES DAILY WITH A MEAL. 360 tablet 3  . Cholecalciferol 50 MCG (2000 UT) TABS Take one tablet daily alternating with two tablets daily 30 tablet   . clopidogrel (PLAVIX) 75 MG tablet Take 1 tablet (75 mg total) by mouth daily. 90 tablet 1  . doxylamine, Sleep, (UNISOM) 25 MG tablet Take 25 mg by mouth at bedtime.    . Evolocumab (REPATHA SURECLICK) 992 MG/ML SOAJ Inject 1 pen into the skin every 14 (fourteen) days. (Patient taking differently: Inject 140 mg into the skin every 14 (fourteen) days. Thursday) 2 mL 11  . ferrous sulfate 325 (65 FE) MG tablet TAKE 1 TABLET BY MOUTH  EVERY DAY WITH BREAKFAST 90 tablet 0  . furosemide (LASIX) 20 MG tablet TAKE 1 TABLET BY MOUTH EVERY DAY (Patient taking differently: Take 20 mg by mouth daily.) 90 tablet 2  . glipiZIDE (GLUCOTROL) 10 MG tablet Take 1 tablet (10 mg total) by mouth 2 (two) times daily before a meal. 180 tablet 1  . glucose blood (ONETOUCH ULTRA) test strip 1 each by Other route 2 (two) times daily. LAST REFILL SCHEDULE PHYSICAL 200 strip 0  . isosorbide mononitrate (IMDUR) 60 MG 24 hr tablet TAKE 1 TABLET BY MOUTH EVERY DAY (Patient taking differently: Take 60 mg by mouth daily.) 90 tablet  1  . linagliptin (TRADJENTA) 5 MG TABS tablet TAKE 1 TABLET (5 MG TOTAL) BY MOUTH DAILY. 90 tablet 1  . losartan (COZAAR) 50 MG tablet Take 1 tablet (50 mg total) by mouth daily. 90 tablet 3  . metFORMIN (GLUCOPHAGE) 1000 MG tablet Take 1 tablet (1,000 mg total) by mouth 2 (two) times daily with a meal. 180 tablet 2  . nitroGLYCERIN (NITROSTAT) 0.4 MG SL tablet Place 1 tablet (0.4 mg total) under the tongue every 5 (five) minutes as needed for chest pain. 25 tablet 3  . omeprazole (PRILOSEC) 40 MG capsule TAKE 1 CAPSULE BY MOUTH EVERY DAY 90 capsule 0  . ondansetron (ZOFRAN-ODT) 4 MG disintegrating tablet TAKE 1 TABLET BY MOUTH EVERY 8 HOURS AS NEEDED FOR NAUSEA AND VOMITING 30 tablet 0  . spironolactone (ALDACTONE) 25 MG tablet Take 0.5 tablets (12.5 mg total) by mouth daily. 45 tablet 3  . vitamin B-12 (CYANOCOBALAMIN) 500 MCG tablet Take 1,000 mcg by mouth 2 (two) times daily.    . Vitamin D, Ergocalciferol, (DRISDOL) 1.25 MG (50000 UNIT) CAPS capsule Take 1 capsule (50,000 Units total) by mouth every 7 (seven) days. Please return for vit d level check on 02/10/21. 8 capsule 0   No current facility-administered medications for this visit.    REVIEW OF SYSTEMS:   Constitutional: ( - ) fevers, ( - )  chills , ( - ) night sweats Eyes: ( - ) blurriness of vision, ( - ) double vision, ( - ) watery eyes Ears, nose, mouth, throat,  and face: ( - ) mucositis, ( - ) sore throat Respiratory: ( - ) cough, ( - ) dyspnea, ( - ) wheezes Cardiovascular: ( - ) palpitation, ( - ) chest discomfort, ( - ) lower extremity swelling Gastrointestinal:  ( - ) nausea, ( - ) heartburn, ( - ) change in bowel habits Skin: ( - ) abnormal skin rashes Lymphatics: ( - ) new lymphadenopathy, ( - ) easy bruising Neurological: ( - ) numbness, ( - ) tingling, ( - ) new weaknesses Behavioral/Psych: ( - ) mood change, ( - ) new changes  All other systems were reviewed with the patient and are negative.  PHYSICAL EXAMINATION: ECOG PERFORMANCE STATUS: 1 - Symptomatic but completely ambulatory  Vitals:   04/04/21 1405  BP: (!) 148/72  Pulse: 82  Resp: 19  Temp: 98.1 F (36.7 C)  SpO2: 100%   Filed Weights   04/04/21 1405  Weight: (!) 307 lb 8 oz (139.5 kg)    GENERAL: well appearing African American female in NAD, obese SKIN: skin color, texture, turgor are normal, no rashes or significant lesions EYES: conjunctiva are pink and non-injected, sclera clear OROPHARYNX: no exudate, no erythema; lips, buccal mucosa, and tongue normal  NECK: supple, non-tender LYMPH:  no palpable lymphadenopathy in the cervical, axillary or supraclavicular lymph nodes.  LUNGS: clear to auscultation and percussion with normal breathing effort HEART: regular rate & rhythm and no murmurs and no lower extremity edema ABDOMEN: soft, non-tender, non-distended, normal bowel sounds Musculoskeletal: no cyanosis of digits and no clubbing  PSYCH: alert & oriented x 3, fluent speech NEURO: no focal motor/sensory deficits  LABORATORY DATA:  I have reviewed the data as listed CBC Latest Ref Rng & Units 04/04/2021 03/29/2021 11/22/2020  WBC 4.0 - 10.5 K/uL 5.8 5.6 4.5  Hemoglobin 12.0 - 15.0 g/dL 8.5(L) 9.2(L) 10.1(L)  Hematocrit 36.0 - 46.0 % 28.6(L) 29.0(L) 32.2(L)  Platelets 150 - 400 K/uL 260 251.0 235.0  CMP Latest Ref Rng & Units 03/29/2021 12/28/2020 12/12/2020   Glucose 70 - 99 mg/dL 74 63(L) 61(L)  BUN 6 - 23 mg/dL 33(H) 26 26  Creatinine 0.40 - 1.20 mg/dL 2.18(H) 1.83(H) 2.05(H)  Sodium 135 - 145 mEq/L 143 142 139  Potassium 3.5 - 5.1 mEq/L 4.2 5.5(H) 5.2  Chloride 96 - 112 mEq/L 107 106 104  CO2 19 - 32 mEq/L 26 21 23   Calcium 8.4 - 10.5 mg/dL 8.9 8.8 8.5(L)  Total Protein 6.0 - 8.3 g/dL 6.4 - -  Total Bilirubin 0.2 - 1.2 mg/dL 0.4 - -  Alkaline Phos 39 - 117 U/L 99 - -  AST 0 - 37 U/L 15 - -  ALT 0 - 35 U/L 9 - -   ASSESSMENT & PLAN Alicia [name] is a 75 y.o. female presenting to the clinic for evaluation for microcytic anemia. Outside labs from 11/30/2020 indicate vitamin B12 and iron deficiency. Patient is currently on ferrous sulfate 325 mg daily and vitamin B12 1000 mcg twice daily. Based on history, the likely cause of both iron and B12 deficiency is malabsorption secondary to gastric bypass surgery. Recommend to repeat labs today with CBC, CMP, retic panel, iron and TIBC, ferritin, vitamin B12 and methylmalonic acid levels. Recommend IV venofer once a week x 5 doses.   #Iron deficiency and Vitamin B12 deficiency anemia: --Patient underwent gastric bypass surgery in the 1980's so likely etiology is malabsorption.  --Patient denies any signs of bleeding. She underwent colonoscopy in August 2021 and EGD in March 2022 without any evidence of active or recent bleeding.  --Currently on ferrous sulfate 325 mg daily and vitamin B12 1000 mcg twice daily.  --Repeat labs today to check CBC, CMP, retic panel, iron and TIBC, ferritin, vitamin B12 and methylmalonic acid levels. --Recommend IV venofer once weekly x 5 doses --RTC 4 weeks with labs after IV venofer therapy.    Orders Placed This Encounter  Procedures  . CBC with Differential (Cancer Center Only)    Standing Status:   Future    Number of Occurrences:   1    Standing Expiration Date:   04/04/2022  . Ferritin    Standing Status:   Future    Number of Occurrences:   1    Standing  Expiration Date:   04/04/2022  . Iron and TIBC    Standing Status:   Future    Number of Occurrences:   1    Standing Expiration Date:   04/04/2022  . Retic Panel    Standing Status:   Future    Number of Occurrences:   1    Standing Expiration Date:   04/04/2022  . Methylmalonic acid, serum    Standing Status:   Future    Number of Occurrences:   1    Standing Expiration Date:   04/04/2022  . Vitamin B12    Standing Status:   Future    Number of Occurrences:   1    Standing Expiration Date:   04/04/2022    All questions were answered. The patient knows to call the clinic with any problems, questions or concerns.  I have spent a total of 65 minutes minutes of face-to-face and non-face-to-face time, preparing to see the patient, obtaining and/or reviewing separately obtained history, performing a medically appropriate examination, counseling and educating the patient, ordering medications/tests, documenting clinical information in the electronic health record, and care coordination.   Dede Query, PA-C Department of [place]  [place] [facility] Phone: [phone]

## 2021-04-05 DIAGNOSIS — D509 Iron deficiency anemia, unspecified: Secondary | ICD-10-CM | POA: Insufficient documentation

## 2021-04-06 ENCOUNTER — Encounter: Payer: Self-pay | Admitting: Physician Assistant

## 2021-04-09 LAB — METHYLMALONIC ACID, SERUM: Methylmalonic Acid, Quantitative: 222 nmol/L (ref 0–378)

## 2021-04-12 ENCOUNTER — Other Ambulatory Visit: Payer: Self-pay | Admitting: Internal Medicine

## 2021-04-12 ENCOUNTER — Other Ambulatory Visit: Payer: Self-pay

## 2021-04-22 ENCOUNTER — Other Ambulatory Visit: Payer: Self-pay | Admitting: Internal Medicine

## 2021-04-26 NOTE — Telephone Encounter (Signed)
Last OV: 03/29/21 Last refill: 02/24/21 Next appt: 06/29/21 for a 3 month DM f/u  No TOC appt

## 2021-04-28 ENCOUNTER — Other Ambulatory Visit: Payer: Self-pay

## 2021-04-28 ENCOUNTER — Inpatient Hospital Stay: Payer: Medicare PPO | Attending: Physician Assistant

## 2021-04-28 VITALS — BP 139/67 | HR 77 | Temp 98.1°F | Resp 17

## 2021-04-28 DIAGNOSIS — D508 Other iron deficiency anemias: Secondary | ICD-10-CM

## 2021-04-28 DIAGNOSIS — D509 Iron deficiency anemia, unspecified: Secondary | ICD-10-CM | POA: Insufficient documentation

## 2021-04-28 MED ORDER — SODIUM CHLORIDE 0.9 % IV SOLN
Freq: Once | INTRAVENOUS | Status: AC
Start: 2021-04-28 — End: 2021-04-28
  Filled 2021-04-28: qty 250

## 2021-04-28 MED ORDER — SODIUM CHLORIDE 0.9 % IV SOLN
200.0000 mg | Freq: Once | INTRAVENOUS | Status: AC
  Administered 2021-04-28: 200 mg via INTRAVENOUS
  Filled 2021-04-28: qty 200

## 2021-04-28 NOTE — Patient Instructions (Signed)
[facility]  Discharge Instructions: Thank you for choosing [facility] to provide your oncology and hematology care.   If you have a lab appointment with the Cancer Center, please go directly to the Cancer Center and check in at the registration area.   Wear comfortable clothing and clothing appropriate for easy access to any Portacath or PICC line.   We strive to give you quality time with your provider. You may need to reschedule your appointment if you arrive late (15 or more minutes).  Arriving late affects you and other patients whose appointments are after yours.  Also, if you miss three or more appointments without notifying the office, you may be dismissed from the clinic at the provider's discretion.      For prescription refill requests, have your pharmacy contact our office and allow 72 hours for refills to be completed.    Today you received the following medication - Venofer      To help prevent nausea and vomiting after your treatment, we encourage you to take your nausea medication as directed.  BELOW ARE SYMPTOMS THAT SHOULD BE REPORTED IMMEDIATELY: *FEVER GREATER THAN 100.4 F (38 C) OR HIGHER *CHILLS OR SWEATING *NAUSEA AND VOMITING THAT IS NOT CONTROLLED WITH YOUR NAUSEA MEDICATION *UNUSUAL SHORTNESS OF BREATH *UNUSUAL BRUISING OR BLEEDING *URINARY PROBLEMS (pain or burning when urinating, or frequent urination) *BOWEL PROBLEMS (unusual diarrhea, constipation, pain near the anus) TENDERNESS IN MOUTH AND THROAT WITH OR WITHOUT PRESENCE OF ULCERS (sore throat, sores in mouth, or a toothache) UNUSUAL RASH, SWELLING OR PAIN  UNUSUAL VAGINAL DISCHARGE OR ITCHING   Items with * indicate a potential emergency and should be followed up as soon as possible or go to the Emergency Department if any problems should occur.  Please show the CHEMOTHERAPY ALERT CARD or IMMUNOTHERAPY ALERT CARD at check-in to the Emergency Department and  triage nurse.  Should you have questions after your visit or need to cancel or reschedule your appointment, please contact [facility]  Dept: [phone]  and follow the prompts.  Office hours are 8:00 a.m. to 4:30 p.m. Monday - Friday. Please note that voicemails left after 4:00 p.m. may not be returned until the following business day.  We are closed weekends and major holidays. You have access to a nurse at all times for urgent questions. Please call the main number to the clinic Dept: [phone] and follow the prompts.   For any non-urgent questions, you may also contact your provider using MyChart. We now offer e-Visits for anyone 18 and older to request care online for non-urgent symptoms. For details visit [email or web address].   Also download the MyChart app! Go to the app store, search "MyChart", open the app, select , and log in with your MyChart username and password.  Due to Covid, a mask is required upon entering the hospital/clinic. If you do not have a mask, one will be given to you upon arrival. For doctor visits, patients may have 1 support person aged 18 or older with them. For treatment visits, patients cannot have anyone with them due to current Covid guidelines and our immunocompromised population.   Iron Sucrose injection What is this medication? IRON SUCROSE (AHY ern SOO krohs) is an iron complex. Iron is used to make healthy red blood cells, which carry oxygen and nutrients throughout the body. This medicine is used to treat iron deficiency anemia in people with chronickidney disease. This medicine may   be used for other purposes; ask your health care provider orpharmacist if you have questions. COMMON BRAND NAME(S): Venofer What should I tell my care team before I take this medication? They need to know if you have any of these conditions: anemia not caused by low iron levels heart disease high levels of iron in the  blood kidney disease liver disease an unusual or allergic reaction to iron, other medicines, foods, dyes, or preservatives pregnant or trying to get pregnant breast-feeding How should I use this medication? This medicine is for infusion into a vein. It is given by a health careprofessional in a hospital or clinic setting. Talk to your pediatrician regarding the use of this medicine in children. While this drug may be prescribed for children as young as 2 years for selectedconditions, precautions do apply. Overdosage: If you think you have taken too much of this medicine contact apoison control center or emergency room at once. NOTE: This medicine is only for you. Do not share this medicine with others. What if I miss a dose? It is important not to miss your dose. Call your doctor or health careprofessional if you are unable to keep an appointment. What may interact with this medication? Do not take this medicine with any of the following medications: deferoxamine dimercaprol other iron products This medicine may also interact with the following medications: chloramphenicol deferasirox This list may not describe all possible interactions. Give your health care provider a list of all the medicines, herbs, non-prescription drugs, or dietary supplements you use. Also tell them if you smoke, drink alcohol, or use illegaldrugs. Some items may interact with your medicine. What should I watch for while using this medication? Visit your doctor or healthcare professional regularly. Tell your doctor or healthcare professional if your symptoms do not start to get better or if theyget worse. You may need blood work done while you are taking this medicine. You may need to follow a special diet. Talk to your doctor. Foods that contain iron include: whole grains/cereals, dried fruits, beans, or peas, leafy greenvegetables, and organ meats (liver, kidney). What side effects may I notice from receiving this  medication? Side effects that you should report to your doctor or health care professionalas soon as possible: allergic reactions like skin rash, itching or hives, swelling of the face, lips, or tongue breathing problems changes in blood pressure cough fast, irregular heartbeat feeling faint or lightheaded, falls fever or chills flushing, sweating, or hot feelings joint or muscle aches/pains seizures swelling of the ankles or feet unusually weak or tired Side effects that usually do not require medical attention (report to yourdoctor or health care professional if they continue or are bothersome): diarrhea feeling achy headache irritation at site where injected nausea, vomiting stomach upset tiredness This list may not describe all possible side effects. Call your doctor for medical advice about side effects. You may report side effects to FDA at1-800-FDA-1088. Where should I keep my medication? This drug is given in a hospital or clinic and will not be stored at home. NOTE: This sheet is a summary. It may not cover all possible information. If you have questions about this medicine, talk to your doctor, pharmacist, orhealth care provider.  2022 Elsevier/Gold Standard (2011-08-16 17:14:35)   

## 2021-05-05 ENCOUNTER — Inpatient Hospital Stay: Payer: Medicare PPO

## 2021-05-05 ENCOUNTER — Other Ambulatory Visit: Payer: Self-pay

## 2021-05-05 VITALS — BP 135/58 | HR 72 | Temp 98.1°F | Resp 16

## 2021-05-05 DIAGNOSIS — D509 Iron deficiency anemia, unspecified: Secondary | ICD-10-CM | POA: Diagnosis not present

## 2021-05-05 DIAGNOSIS — D508 Other iron deficiency anemias: Secondary | ICD-10-CM

## 2021-05-05 MED ORDER — SODIUM CHLORIDE 0.9 % IV SOLN
Freq: Once | INTRAVENOUS | Status: AC
  Filled 2021-05-05: qty 250

## 2021-05-05 MED ORDER — SODIUM CHLORIDE 0.9 % IV SOLN
200.0000 mg | Freq: Once | INTRAVENOUS | Status: AC
  Administered 2021-05-05: 200 mg via INTRAVENOUS
  Filled 2021-05-05: qty 200

## 2021-05-05 NOTE — Patient Instructions (Signed)

## 2021-05-08 ENCOUNTER — Other Ambulatory Visit: Payer: Self-pay | Admitting: Internal Medicine

## 2021-05-11 ENCOUNTER — Other Ambulatory Visit: Payer: Self-pay | Admitting: Adult Health

## 2021-05-11 DIAGNOSIS — I10 Essential (primary) hypertension: Secondary | ICD-10-CM

## 2021-05-11 DIAGNOSIS — I2510 Atherosclerotic heart disease of native coronary artery without angina pectoris: Secondary | ICD-10-CM

## 2021-05-12 ENCOUNTER — Other Ambulatory Visit: Payer: Self-pay

## 2021-05-12 ENCOUNTER — Inpatient Hospital Stay: Payer: Medicare PPO

## 2021-05-12 VITALS — BP 120/60 | HR 62 | Temp 98.2°F | Resp 17

## 2021-05-12 DIAGNOSIS — D509 Iron deficiency anemia, unspecified: Secondary | ICD-10-CM | POA: Diagnosis not present

## 2021-05-12 DIAGNOSIS — D508 Other iron deficiency anemias: Secondary | ICD-10-CM

## 2021-05-12 MED ORDER — SODIUM CHLORIDE 0.9 % IV SOLN
Freq: Once | INTRAVENOUS | Status: AC
  Filled 2021-05-12: qty 250

## 2021-05-12 MED ORDER — SODIUM CHLORIDE 0.9 % IV SOLN
200.0000 mg | Freq: Once | INTRAVENOUS | Status: AC
  Administered 2021-05-12: 200 mg via INTRAVENOUS
  Filled 2021-05-12: qty 200

## 2021-05-12 NOTE — Patient Instructions (Signed)

## 2021-05-15 ENCOUNTER — Telehealth: Payer: Self-pay | Admitting: Internal Medicine

## 2021-05-15 NOTE — Telephone Encounter (Signed)
Pt needs Dr Einar Pheasant to send refills to the pharmacy for the following meds. Pt is almost out of some of these if not already out and they cannot refill them due to them being under Webb Silversmith, NP.   Januvia Furosemide Plavix Zofran Omeprazole Ferrous sulfate Amlodipine Vit b12 Vit D  [place] [facility]

## 2021-05-16 ENCOUNTER — Other Ambulatory Visit: Payer: Self-pay | Admitting: Internal Medicine

## 2021-05-16 DIAGNOSIS — I10 Essential (primary) hypertension: Secondary | ICD-10-CM

## 2021-05-16 MED ORDER — VITAMIN B-12 500 MCG PO TABS: 1000.0000 ug | ORAL_TABLET | Freq: Two times a day (BID) | ORAL | 2 refills | Status: DC

## 2021-05-16 MED ORDER — ONDANSETRON 4 MG PO TBDP: ORAL_TABLET | ORAL | 0 refills | Status: DC

## 2021-05-16 NOTE — Telephone Encounter (Signed)
The following was sent to pt in a mychart message:  I got a message this morning that you needed a few medications refilled. After reviewing your med list that we have, I have pended the following for refills, for our NP to fill: Zofran B-12 Ferrous sulfate   Januvia and Plavix are not on your med list with [place], so I'm not able to refill those.   The furosemide, omeprazole and amlodipine were recently refilled and the Vitamin D you should be getting over the counter now.   If you have any questions please reply to this message.

## 2021-05-16 NOTE — Telephone Encounter (Signed)
   Notes to clinic Not a patient in this practice.  

## 2021-05-16 NOTE — Telephone Encounter (Signed)
  Last Office Visit and Type: 03/19/21 for diabetes  Next Office Visit and Type: 06/29/21 for DM f/u  No TOC appt ever done.

## 2021-05-19 ENCOUNTER — Other Ambulatory Visit: Payer: Self-pay

## 2021-05-19 ENCOUNTER — Inpatient Hospital Stay: Payer: Medicare PPO | Attending: Physician Assistant

## 2021-05-19 VITALS — BP 136/71 | HR 68 | Temp 97.8°F | Resp 18

## 2021-05-19 DIAGNOSIS — E538 Deficiency of other specified B group vitamins: Secondary | ICD-10-CM | POA: Diagnosis not present

## 2021-05-19 DIAGNOSIS — Z9884 Bariatric surgery status: Secondary | ICD-10-CM | POA: Insufficient documentation

## 2021-05-19 DIAGNOSIS — N189 Chronic kidney disease, unspecified: Secondary | ICD-10-CM | POA: Diagnosis not present

## 2021-05-19 DIAGNOSIS — D509 Iron deficiency anemia, unspecified: Secondary | ICD-10-CM | POA: Insufficient documentation

## 2021-05-19 DIAGNOSIS — K909 Intestinal malabsorption, unspecified: Secondary | ICD-10-CM | POA: Insufficient documentation

## 2021-05-19 DIAGNOSIS — D508 Other iron deficiency anemias: Secondary | ICD-10-CM

## 2021-05-19 MED ORDER — SODIUM CHLORIDE 0.9 % IV SOLN
Freq: Once | INTRAVENOUS | Status: AC
  Filled 2021-05-19: qty 250

## 2021-05-19 MED ORDER — IRON SUCROSE 20 MG/ML IV SOLN
200.0000 mg | Freq: Once | INTRAVENOUS | Status: AC
  Administered 2021-05-19: 200 mg via INTRAVENOUS
  Filled 2021-05-19: qty 200

## 2021-05-19 NOTE — Patient Instructions (Signed)

## 2021-05-20 ENCOUNTER — Other Ambulatory Visit: Payer: Self-pay | Admitting: Internal Medicine

## 2021-05-20 NOTE — Telephone Encounter (Signed)
Rerouted to [facility]

## 2021-05-24 NOTE — Telephone Encounter (Signed)
She was advised to stop on 03/29/2021.   No need to refill.

## 2021-05-26 ENCOUNTER — Other Ambulatory Visit: Payer: Self-pay | Admitting: Internal Medicine

## 2021-05-26 ENCOUNTER — Other Ambulatory Visit: Payer: Self-pay

## 2021-05-26 ENCOUNTER — Inpatient Hospital Stay: Payer: Medicare PPO

## 2021-05-26 VITALS — BP 155/63 | HR 74 | Temp 98.0°F | Resp 20

## 2021-05-26 DIAGNOSIS — E538 Deficiency of other specified B group vitamins: Secondary | ICD-10-CM | POA: Diagnosis not present

## 2021-05-26 DIAGNOSIS — I10 Essential (primary) hypertension: Secondary | ICD-10-CM

## 2021-05-26 DIAGNOSIS — D509 Iron deficiency anemia, unspecified: Secondary | ICD-10-CM | POA: Diagnosis not present

## 2021-05-26 DIAGNOSIS — Z9884 Bariatric surgery status: Secondary | ICD-10-CM | POA: Diagnosis not present

## 2021-05-26 DIAGNOSIS — D508 Other iron deficiency anemias: Secondary | ICD-10-CM

## 2021-05-26 DIAGNOSIS — N189 Chronic kidney disease, unspecified: Secondary | ICD-10-CM | POA: Diagnosis not present

## 2021-05-26 DIAGNOSIS — K909 Intestinal malabsorption, unspecified: Secondary | ICD-10-CM | POA: Diagnosis not present

## 2021-05-26 MED ORDER — SODIUM CHLORIDE 0.9 % IV SOLN
Freq: Once | INTRAVENOUS | Status: AC
  Filled 2021-05-26: qty 250

## 2021-05-26 MED ORDER — IRON SUCROSE 20 MG/ML IV SOLN
200.0000 mg | Freq: Once | INTRAVENOUS | Status: AC
  Administered 2021-05-26: 200 mg via INTRAVENOUS
  Filled 2021-05-26: qty 200

## 2021-05-26 NOTE — Patient Instructions (Signed)

## 2021-05-29 ENCOUNTER — Telehealth: Payer: Self-pay

## 2021-05-29 MED ORDER — CLOPIDOGREL BISULFATE 75 MG PO TABS: 75.0000 mg | ORAL_TABLET | Freq: Every day | ORAL | 3 refills | Status: DC

## 2021-05-29 NOTE — Telephone Encounter (Signed)
Plavix has not been discontinued but it has come off her med list.  I have sent refills for Plavix.  Called pt to let her know.  She has not run out and will pick up new prescription.  Thanked me for the call.

## 2021-05-29 NOTE — Telephone Encounter (Signed)
Pt called in wanting a refill on her Plavix, however the medication is no longer on her list and I do not see where is was discontinued by our office. Pt states that she has been taking the medication and now she is out. Per Dr. Harrington Challenger note on 12/12/20 pt is to continue taking Plavix. Is it okay to refill medication?

## 2021-06-08 ENCOUNTER — Other Ambulatory Visit: Payer: Self-pay | Admitting: Internal Medicine

## 2021-06-08 DIAGNOSIS — I10 Essential (primary) hypertension: Secondary | ICD-10-CM

## 2021-06-12 ENCOUNTER — Other Ambulatory Visit: Payer: Self-pay | Admitting: Physician Assistant

## 2021-06-12 DIAGNOSIS — D508 Other iron deficiency anemias: Secondary | ICD-10-CM

## 2021-06-13 ENCOUNTER — Other Ambulatory Visit: Payer: Self-pay

## 2021-06-13 ENCOUNTER — Inpatient Hospital Stay: Payer: Medicare PPO | Admitting: Physician Assistant

## 2021-06-13 ENCOUNTER — Telehealth: Payer: Self-pay | Admitting: Physician Assistant

## 2021-06-13 ENCOUNTER — Inpatient Hospital Stay: Payer: Medicare PPO

## 2021-06-13 VITALS — BP 131/61 | HR 62 | Temp 98.6°F | Resp 18 | Ht 65.0 in | Wt 306.6 lb

## 2021-06-13 DIAGNOSIS — Z9884 Bariatric surgery status: Secondary | ICD-10-CM | POA: Diagnosis not present

## 2021-06-13 DIAGNOSIS — E538 Deficiency of other specified B group vitamins: Secondary | ICD-10-CM | POA: Diagnosis not present

## 2021-06-13 DIAGNOSIS — D509 Iron deficiency anemia, unspecified: Secondary | ICD-10-CM

## 2021-06-13 DIAGNOSIS — D508 Other iron deficiency anemias: Secondary | ICD-10-CM

## 2021-06-13 DIAGNOSIS — K909 Intestinal malabsorption, unspecified: Secondary | ICD-10-CM | POA: Diagnosis not present

## 2021-06-13 DIAGNOSIS — N189 Chronic kidney disease, unspecified: Secondary | ICD-10-CM | POA: Diagnosis not present

## 2021-06-13 LAB — VITAMIN B12: Vitamin B-12: 2508 pg/mL — ABNORMAL HIGH (ref 180–914)

## 2021-06-13 LAB — CMP (CANCER CENTER ONLY)
ALT: 9 U/L (ref 0–44)
AST: 14 U/L — ABNORMAL LOW (ref 15–41)
Albumin: 3.6 g/dL (ref 3.5–5.0)
Alkaline Phosphatase: 101 U/L (ref 38–126)
Anion gap: 8 (ref 5–15)
BUN: 31 mg/dL — ABNORMAL HIGH (ref 8–23)
CO2: 27 mmol/L (ref 22–32)
Calcium: 8.6 mg/dL — ABNORMAL LOW (ref 8.9–10.3)
Chloride: 107 mmol/L (ref 98–111)
Creatinine: 2.17 mg/dL — ABNORMAL HIGH (ref 0.44–1.00)
GFR, Estimated: 23 mL/min — ABNORMAL LOW (ref >60)
Glucose, Bld: 108 mg/dL — ABNORMAL HIGH (ref 70–99)
Potassium: 5 mmol/L (ref 3.5–5.1)
Sodium: 142 mmol/L (ref 135–145)
Total Bilirubin: 0.4 mg/dL (ref 0.3–1.2)
Total Protein: 6.4 g/dL — ABNORMAL LOW (ref 6.5–8.1)

## 2021-06-13 LAB — CBC WITH DIFFERENTIAL (CANCER CENTER ONLY)
Abs Immature Granulocytes: 0.01 10*3/uL (ref 0.00–0.07)
Basophils Absolute: 0 10*3/uL (ref 0.0–0.1)
Basophils Relative: 0 %
Eosinophils Absolute: 0.2 10*3/uL (ref 0.0–0.5)
Eosinophils Relative: 3 %
HCT: 30.8 % — ABNORMAL LOW (ref 36.0–46.0)
Hemoglobin: 9.1 g/dL — ABNORMAL LOW (ref 12.0–15.0)
Immature Granulocytes: 0 %
Lymphocytes Relative: 34 %
Lymphs Abs: 1.7 10*3/uL (ref 0.7–4.0)
MCH: 22.4 pg — ABNORMAL LOW (ref 26.0–34.0)
MCHC: 29.5 g/dL — ABNORMAL LOW (ref 30.0–36.0)
MCV: 75.7 fL — ABNORMAL LOW (ref 80.0–100.0)
Monocytes Absolute: 0.5 10*3/uL (ref 0.1–1.0)
Monocytes Relative: 9 %
Neutro Abs: 2.7 10*3/uL (ref 1.7–7.7)
Neutrophils Relative %: 54 %
Platelet Count: 241 10*3/uL (ref 150–400)
RBC: 4.07 MIL/uL (ref 3.87–5.11)
RDW: 18.3 % — ABNORMAL HIGH (ref 11.5–15.5)
WBC Count: 5 10*3/uL (ref 4.0–10.5)
nRBC: 0 % (ref 0.0–0.2)

## 2021-06-13 LAB — FERRITIN: Ferritin: 392 ng/mL — ABNORMAL HIGH (ref 11–307)

## 2021-06-13 LAB — IRON AND TIBC
Iron: 71 ug/dL (ref 41–142)
Saturation Ratios: 31 % (ref 21–57)
TIBC: 230 ug/dL — ABNORMAL LOW (ref 236–444)
UIBC: 159 ug/dL (ref 120–384)

## 2021-06-13 LAB — RETIC PANEL
Immature Retic Fract: 14 % (ref 2.3–15.9)
RBC.: 3.98 MIL/uL (ref 3.87–5.11)
Retic Count, Absolute: 86.4 10*3/uL (ref 19.0–186.0)
Retic Ct Pct: 2.2 % (ref 0.4–3.1)
Reticulocyte Hemoglobin: 25.1 pg — ABNORMAL LOW (ref >27.9)

## 2021-06-13 NOTE — Telephone Encounter (Signed)
Scheduled per los. Gave avs and calendar  

## 2021-06-13 NOTE — Progress Notes (Signed)
[facility] [phone]   Fax:([phone]) [place] PROGRESS NOTE  Patient Care Team: Jearld Fenton, NP as PCP - General (Internal Medicine) [name], MD as PCP - Cardiology (Cardiology) [name], MD as Consulting Physician (Cardiology)  Hematological/Oncological History #Microcytic Anemia: -07/18/2020: Colonoscopy: one 5 mm polyp in descending colon. Diverticulosis in sigmoid colon and ascending colon.   -11/22/2020: WBC 4.5, Hgb 10.1 (L), MCV 69.6, Plt 235  -11/30/2020: Vitamin B12 186 (L), Retic Ct Pct 1.9%, Abs Retic 87,780 (H), Iron 62, Transferrin 219, iron saturation 20.2% (L), Ferritin 70.4, Folate 12.3. Started vitamin B12 2000 mcg per day.   -01/23/2021:EGD/EUS: Previous surgical intervention-query mini-gastric pouch found in the cardia. Submucosal nodule in the duodenum bulb removed via EMR. Pathology was consistent with polypoid dudoenal tissue showing nodular, mature adipose tissue within the submucosa, consistent with a lipoma. Negative for dysplasia or malignancy.  -03/29/2021: WBC 5.6, Hgb 9.2 (L), MCV 68.6 (L), Plt 251, Ferritin 56.6  -04/04/2021: Establish care with Dede Query PA-C  04/28/2021-05/26/2021: Received IV venofer x 5 doses.    CHIEF COMPLAINTS:  "Microcytic anemia"  HISTORY OF PRESENTING ILLNESS:  Alicia [name] 75 y.o. female returns for a follow up for microcytic anemia. Patient is unaccompanied for this visit. She reports that after receiving IV venofer, she noticed some improvement of her energy levels. She is able to complete her ADLs on her own. She has a good appetite without any noticeable weight changes. Patient denies any nausea, vomiting or abdominal pain. Her extremity edema is stable which improves with elevation. Patient has regular bowel movements without any constipation or diarrhea. Patient denies easy bruising or signs of bleeding. This includes hematochezia, melena, hematuria,  epistaxis, hemoptysis or gingival bleeding. Patient denies any fevers, chills, night sweats, shortness of breath, chest pain or cough. She has no other complaints. Rest of the 10 point ROS is below.   MEDICAL HISTORY:  Past Medical History:  Diagnosis Date   Anemia    Arthritis    "right knee" (10/21/2015)   CAD (coronary artery disease)    a. Cath 10/21/15: s/p DES to RCA: moderate diffuse stenosis of mod diagonal, diffuse irregularity LCx and LAD. 2D echo 10/21/15: mod LVH, EF 60-65%, no RWMA, calcified MV, mod-severe LAE. 03/18/17 PCI with DES--> 1 diag   Cataract    Diabetes ([place])    Essential hypertension    Family history of adverse reaction to anesthesia    "oldest sister, [name], related to brain formation at back of head; when they put her to sleep it's hard to wake her up"   GERD (gastroesophageal reflux disease)    History of blood transfusion    "related to ruptured tubal pregnancy"   Hyperlipidemia    Hypertensive heart disease    Morbid obesity ([place])    Stroke (cerebrum) ([place])    Tubular adenoma of colon    Type II diabetes mellitus ([employer])     SURGICAL HISTORY: Past Surgical History:  Procedure Laterality Date   ABDOMINAL HYSTERECTOMY  1988   fibroid   [place]   BIOPSY  07/18/2020   Procedure: BIOPSY;  Surgeon: [name], MD;  Location: [facility];  Service: Gastroenterology;;   CARDIAC CATHETERIZATION N/A 10/21/2015   Procedure: Right/Left Heart Cath and Coronary Angiography;  Surgeon: [name], MD;  Location: [facility];  Service: Cardiovascular;  Laterality: N/A;   CHOLECYSTECTOMY OPEN  1984  COLONOSCOPY WITH PROPOFOL N/A 07/18/2020   Procedure: COLONOSCOPY WITH PROPOFOL;  Surgeon: [name], MD;  Location: [facility];  Service: Gastroenterology;  Laterality: N/A;   CORONARY STENT INTERVENTION N/A 03/18/2017   Procedure: Coronary Stent Intervention;  Surgeon: [name], MD;  Location: [facility];  Service: Cardiovascular;  Laterality: N/A;   CORONARY STENT PLACEMENT  03/18/2017   A OPTIMIZE STUDY Drug Eluting Stent (2.5 mm 18 mm - post-dilated to 2.7 mm) was successfully placed   ECTOPIC PREGNANCY SURGERY     ENDOSCOPIC MUCOSAL RESECTION N/A 01/23/2021   Procedure: ENDOSCOPIC MUCOSAL RESECTION;  Surgeon: Irving Copas., MD;  Location: [facility];  Service: Gastroenterology;  Laterality: N/A;   ESOPHAGOGASTRODUODENOSCOPY (EGD) WITH PROPOFOL N/A 07/18/2020   Procedure: ESOPHAGOGASTRODUODENOSCOPY (EGD) WITH PROPOFOL;  Surgeon: [name], MD;  [facility]: [facility];  Service: Gastroenterology;  Laterality: N/A;   ESOPHAGOGASTRODUODENOSCOPY (EGD) WITH PROPOFOL N/A 01/23/2021   Procedure: ESOPHAGOGASTRODUODENOSCOPY (EGD) WITH PROPOFOL;  Surgeon: Rush Landmark Telford Nab., MD;  Location: [facility];  Service: Gastroenterology;  Laterality: N/A;   EUS N/A 01/23/2021   Procedure: UPPER ENDOSCOPIC ULTRASOUND (EUS) RADIAL;  Surgeon: Irving Copas., MD;  Location: [facility];  Service: Gastroenterology;  Laterality: N/A;   HEMOSTASIS CLIP PLACEMENT  01/23/2021   Procedure: HEMOSTASIS CLIP PLACEMENT;  Surgeon: Irving Copas., MD;  Location: [facility];  Service: Gastroenterology;;   INTRAVASCULAR PRESSURE WIRE/FFR STUDY N/A 09/04/2019   Procedure: INTRAVASCULAR PRESSURE WIRE/FFR STUDY;  Surgeon: Nelva Bush, MD;  Location: [facility];  Service: Cardiovascular;  Laterality: N/A;   LEFT HEART CATH AND CORONARY ANGIOGRAPHY N/A 03/18/2017   Procedure: Left Heart Cath and Coronary Angiography;  Surgeon: [name], MD;  Location: [facility];  Service: Cardiovascular;  Laterality: N/A;   LEFT HEART CATH AND CORONARY ANGIOGRAPHY N/A 09/04/2019   Procedure: LEFT HEART CATH AND CORONARY ANGIOGRAPHY;  Surgeon: Nelva Bush, MD;  Location: [facility];  Service: Cardiovascular;  Laterality: N/A;   POLYPECTOMY  07/18/2020   Procedure:  POLYPECTOMY;  Surgeon: [name], MD;  Location: [facility];  Service: Gastroenterology;;   REDUCTION MAMMAPLASTY Bilateral ~ [place] INJECTION  01/23/2021   Procedure: SUBMUCOSAL LIFTING INJECTION;  Surgeon: Irving Copas., MD;  Location: [facility];  Service: Gastroenterology;;   TONSILLECTOMY      SOCIAL HISTORY: Social History   Socioeconomic History   Marital status: Divorced    Spouse name: Not on file   Number of children: 3   Years of education: doctorate   Highest education level: Not on file  Occupational History   Occupation: Teacher/retired  Tobacco Use   Smoking status: Never   Smokeless tobacco: Never  Vaping Use   Vaping Use: Never used  Substance and Sexual Activity   Alcohol use: No   Drug use: No   Sexual activity: Not Currently  Other Topics Concern   Not on file  Social History Narrative   Caffeine use-yes   Regular exercise-no   Social Determinants of Health   Financial Resource Strain: Not on file  Food Insecurity: Not on file  Transportation Needs: Not on file  Physical Activity: Not on file  Stress: Not on file  Social Connections: Not on file  Intimate Partner Violence: Not on file    FAMILY HISTORY: Family History  Problem Relation Age of Onset   Diabetes Mother    Heart attack Mother 75  2007   Hypertension Mother    Hyperlipidemia Mother    Heart disease Mother    Heart attack Father    Stroke Father 50   Hypertension Father    Hyperlipidemia Father    Stomach cancer Sister        in the cavity behind the stomach   Diabetes Sister    Diabetes Sister    Diabetes Sister    Hypertension Son        weight loss corrected that   Migraines Daughter        brain surgery   Migraines Daughter    Breast cancer Neg Hx    Colon cancer Neg Hx    Inflammatory bowel disease Neg Hx    Liver disease Neg Hx    Esophageal cancer Neg Hx    Pancreatic cancer Neg Hx    Rectal cancer Neg Hx      ALLERGIES:  is allergic to iodinated diagnostic agents and sulfa antibiotics.  MEDICATIONS:  Current Outpatient Medications  Medication Sig Dispense Refill   amLODipine (NORVASC) 10 MG tablet TAKE 1 TABLET BY MOUTH EVERY DAY 90 tablet 3   aspirin EC 81 MG tablet Take 1 tablet (81 mg total) by mouth daily. Swallow whole. 30 tablet 11   atorvastatin (LIPITOR) 80 MG tablet TAKE 1 TABLET BY MOUTH DAILY AT 6 PM. (Patient taking differently: Take 80 mg by mouth at bedtime.) 90 tablet 3   carvedilol (COREG) 25 MG tablet TAKE 2 TABLETS (50 MG TOTAL) BY MOUTH 2 (TWO) TIMES DAILY WITH A MEAL. 360 tablet 3   Cholecalciferol 50 MCG (2000 UT) TABS Take one tablet daily alternating with two tablets daily 30 tablet    clopidogrel (PLAVIX) 75 MG tablet Take 1 tablet (75 mg total) by mouth daily. 90 tablet 3   doxylamine, Sleep, (UNISOM) 25 MG tablet Take 25 mg by mouth at bedtime.     Evolocumab (REPATHA SURECLICK) 530 MG/ML SOAJ Inject 1 pen into the skin every 14 (fourteen) days. (Patient taking differently: Inject 140 mg into the skin every 14 (fourteen) days. Thursday) 2 mL 11   ferrous sulfate 325 (65 FE) MG tablet TAKE 1 TABLET BY MOUTH EVERY DAY WITH BREAKFAST 90 tablet 0   furosemide (LASIX) 20 MG tablet TAKE 1 TABLET BY MOUTH EVERY DAY (Patient taking differently: Take 20 mg by mouth daily.) 90 tablet 2   glipiZIDE (GLUCOTROL) 10 MG tablet Take 1 tablet (10 mg total) by mouth 2 (two) times daily before a meal. 180 tablet 1   glucose blood (ONETOUCH ULTRA) test strip 1 each by Other route 2 (two) times daily. LAST REFILL SCHEDULE PHYSICAL 200 strip 0   isosorbide mononitrate (IMDUR) 60 MG 24 hr tablet TAKE 1 TABLET BY MOUTH EVERY DAY 90 tablet 2   linagliptin (TRADJENTA) 5 MG TABS tablet TAKE 1 TABLET (5 MG TOTAL) BY MOUTH DAILY. 90 tablet 1   losartan (COZAAR) 100 MG tablet Take 1 tablet (100 mg total) by mouth daily. 90 tablet 1   losartan (COZAAR) 50 MG tablet Take 1 tablet (50 mg total) by  mouth daily. 90 tablet 3   metFORMIN (GLUCOPHAGE) 1000 MG tablet Take 1 tablet (1,000 mg total) by mouth 2 (two) times daily with a meal. 180 tablet 2   nitroGLYCERIN (NITROSTAT) 0.4 MG SL tablet Place 1 tablet (0.4 mg total) under the tongue every 5 (five) minutes as needed for chest pain. 25 tablet 3   omeprazole (PRILOSEC) 40 MG capsule TAKE  1 CAPSULE BY MOUTH EVERY DAY 90 capsule 0   ondansetron (ZOFRAN-ODT) 4 MG disintegrating tablet TAKE 1 TABLET BY MOUTH EVERY 8 HOURS AS NEEDED FOR NAUSEA AND VOMITING 30 tablet 0   spironolactone (ALDACTONE) 25 MG tablet TAKE 1 TABLET BY MOUTH EVERY DAY 90 tablet 2   vitamin B-12 (CYANOCOBALAMIN) 500 MCG tablet Take 2 tablets (1,000 mcg total) by mouth 2 (two) times daily. 120 tablet 2   Vitamin D, Ergocalciferol, (DRISDOL) 1.25 MG (50000 UNIT) CAPS capsule Take 1 capsule (50,000 Units total) by mouth every 7 (seven) days. Please return for vit d level check on 02/10/21. 8 capsule 0   No current facility-administered medications for this visit.    REVIEW OF SYSTEMS:   Constitutional: ( - ) fevers, ( - )  chills , ( - ) night sweats Eyes: ( - ) blurriness of vision, ( - ) double vision, ( - ) watery eyes Ears, nose, mouth, throat, and face: ( - ) mucositis, ( - ) sore throat Respiratory: ( - ) cough, ( - ) dyspnea, ( - ) wheezes Cardiovascular: ( - ) palpitation, ( - ) chest discomfort, ( - ) lower extremity swelling Gastrointestinal:  ( - ) nausea, ( - ) heartburn, ( - ) change in bowel habits Skin: ( - ) abnormal skin rashes Lymphatics: ( - ) new lymphadenopathy, ( - ) easy bruising Neurological: ( - ) numbness, ( - ) tingling, ( - ) new weaknesses Behavioral/Psych: ( - ) mood change, ( - ) new changes  All other systems were reviewed with the patient and are negative.  PHYSICAL EXAMINATION: ECOG PERFORMANCE STATUS: 1 - Symptomatic but completely ambulatory  Vitals:   06/13/21 1235  BP: 131/61  Pulse: 62  Resp: 18  Temp: 98.6 F (37 C)   SpO2: 100%   Filed Weights   06/13/21 1235  Weight: (!) 306 lb 9.6 oz (139.1 kg)    GENERAL: well appearing African American female in NAD, obese SKIN: skin color, texture, turgor are normal, no rashes or significant lesions EYES: conjunctiva are pink and non-injected, sclera clear OROPHARYNX: no exudate, no erythema; lips, buccal mucosa, and tongue normal  NECK: supple, non-tender LYMPH:  no palpable lymphadenopathy in the cervical, axillary or supraclavicular lymph nodes.  LUNGS: clear to auscultation and percussion with normal breathing effort HEART: regular rate & rhythm and no murmurs and no lower extremity edema ABDOMEN: soft, non-tender, non-distended, normal bowel sounds Musculoskeletal: no cyanosis of digits and no clubbing  PSYCH: alert & oriented x 3, fluent speech NEURO: no focal motor/sensory deficits  LABORATORY DATA:  I have reviewed the data as listed CBC Latest Ref Rng & Units 06/13/2021 04/04/2021 03/29/2021  WBC 4.0 - 10.5 K/uL 5.0 5.8 5.6  Hemoglobin 12.0 - 15.0 g/dL 9.1(L) 8.5(L) 9.2(L)  Hematocrit 36.0 - 46.0 % 30.8(L) 28.6(L) 29.0(L)  Platelets 150 - 400 K/uL 241 260 251.0    CMP Latest Ref Rng & Units 03/29/2021 12/28/2020 12/12/2020  Glucose 70 - 99 mg/dL 74 63(L) 61(L)  BUN 6 - 23 mg/dL 33(H) 26 26  Creatinine 0.40 - 1.20 mg/dL 2.18(H) 1.83(H) 2.05(H)  Sodium 135 - 145 mEq/L 143 142 139  Potassium 3.5 - 5.1 mEq/L 4.2 5.5(H) 5.2  Chloride 96 - 112 mEq/L 107 106 104  CO2 19 - 32 mEq/L _0 Calcium 8.4 - 10.5 mg/dL 8.9 8.8 8.5(L)  Total Protein 6.0 - 8.3 g/dL 6.4 - -  Total Bilirubin 0.2 - 1.2 mg/dL  0.4 - -  Alkaline Phos 39 - 117 U/L 99 - -  AST 0 - 37 U/L 15 - -  ALT 0 - 35 U/L 9 - -   ASSESSMENT & PLAN Alicia [name] is a 75 y.o. female presenting to the clinic for follow up of microcytic anemia.   #Microcytic Anemia: --Multifactorial in the setting of CKD, malabsorption of iron and B12 due to history of gastric bypass.  --Patient denies any  signs of bleeding. She underwent colonoscopy in August 2021 and EGD in March 2022 without any evidence of active or recent bleeding.  --Currently on ferrous sulfate 325 mg daily and vitamin B12 1000 mcg twice daily.  --Patient received IV venofer once weekly x 5 doses from 04/28/2021-05/26/2021. --Labs today show mild improvement of anemia with hemoglobin from 8.5 to 9.1. MCV is 75.7. Iron levels have improved with iron saturation going from 18% to 31%, ferritin is 392, vitamin B12 is elevated  --Will rule out other causes of anemia including paraproteinemia with SPEP and IFE, copper levels and hemoglobinopathies.  --Will discuss with nephrology to consider EPO injections.  --RTC  in 3 months with labs.   Orders Placed This Encounter  Procedures   Vitamin B12    Standing Status:   Future    Number of Occurrences:   1    Standing Expiration Date:   06/13/2022   CMP ([facility] only)    Standing Status:   Future    Number of Occurrences:   1    Standing Expiration Date:   06/13/2022   Multiple Myeloma Panel (SPEP&IFE w/QIG)    Standing Status:   Future    Number of Occurrences:   1    Standing Expiration Date:   06/13/2022   Copper, serum    Standing Status:   Future    Number of Occurrences:   1    Standing Expiration Date:   06/13/2022   Hgb Fractionation Cascade    Standing Status:   Future    Number of Occurrences:   1    Standing Expiration Date:   06/13/2022     All questions were answered. The patient knows to call the clinic with any problems, questions or concerns.  I have spent a total of 30 minutes minutes of face-to-face and non-face-to-face time, preparing to see the patient, obtaining and/or reviewing separately obtained history, performing a medically appropriate examination, counseling and educating the patient, ordering tests, documenting clinical information in the electronic health record, and care coordination.   Dede Query, PA-C Department of  Hematology/Oncology [place] [facility] Phone: [phone]

## 2021-06-14 LAB — COPPER, SERUM: Copper: 96 ug/dL (ref 80–158)

## 2021-06-15 ENCOUNTER — Encounter: Payer: Self-pay | Admitting: Physician Assistant

## 2021-06-15 LAB — HGB FRACTIONATION CASCADE
Hgb A2: 2.1 % (ref 1.8–3.2)
Hgb A: 97.9 % (ref 96.4–98.8)
Hgb F: 0 % (ref 0.0–2.0)
Hgb S: 0 %

## 2021-06-16 LAB — MULTIPLE MYELOMA PANEL, SERUM
Albumin SerPl Elph-Mcnc: 3.5 g/dL (ref 2.9–4.4)
Albumin/Glob SerPl: 1.5 (ref 0.7–1.7)
Alpha 1: 0.2 g/dL (ref 0.0–0.4)
Alpha2 Glob SerPl Elph-Mcnc: 0.7 g/dL (ref 0.4–1.0)
B-Globulin SerPl Elph-Mcnc: 0.8 g/dL (ref 0.7–1.3)
Gamma Glob SerPl Elph-Mcnc: 0.7 g/dL (ref 0.4–1.8)
Globulin, Total: 2.4 g/dL (ref 2.2–3.9)
IgA: 174 mg/dL (ref 64–422)
IgG (Immunoglobin G), Serum: 635 mg/dL (ref 586–1602)
IgM (Immunoglobulin M), Srm: 227 mg/dL — ABNORMAL HIGH (ref 26–217)
Total Protein ELP: 5.9 g/dL — ABNORMAL LOW (ref 6.0–8.5)

## 2021-06-19 ENCOUNTER — Encounter: Payer: Self-pay | Admitting: Physician Assistant

## 2021-06-19 ENCOUNTER — Telehealth: Payer: Self-pay | Admitting: *Deleted

## 2021-06-19 NOTE — Telephone Encounter (Signed)
Call made to [facility] ([name]) to have [name] evaluate pt for EPO injections. Pt's iron deficiency and B12 deficiency has been resolved and pt remains anemic.  Her labs have been fax'd to Dr. Shelva Majestic office. MD assistant will pass message to [name],

## 2021-06-29 ENCOUNTER — Ambulatory Visit: Payer: Medicare PPO | Admitting: Family Medicine

## 2021-07-04 ENCOUNTER — Other Ambulatory Visit: Payer: Self-pay

## 2021-07-04 ENCOUNTER — Ambulatory Visit: Payer: Medicare PPO | Admitting: Family Medicine

## 2021-07-04 ENCOUNTER — Other Ambulatory Visit (HOSPITAL_COMMUNITY): Payer: Self-pay | Admitting: *Deleted

## 2021-07-04 VITALS — BP 122/70 | HR 63 | Temp 97.6°F | Ht 65.0 in | Wt 303.5 lb

## 2021-07-04 DIAGNOSIS — E119 Type 2 diabetes mellitus without complications: Secondary | ICD-10-CM | POA: Diagnosis not present

## 2021-07-04 DIAGNOSIS — D508 Other iron deficiency anemias: Secondary | ICD-10-CM | POA: Diagnosis not present

## 2021-07-04 DIAGNOSIS — K219 Gastro-esophageal reflux disease without esophagitis: Secondary | ICD-10-CM

## 2021-07-04 DIAGNOSIS — H269 Unspecified cataract: Secondary | ICD-10-CM

## 2021-07-04 LAB — POCT GLYCOSYLATED HEMOGLOBIN (HGB A1C): Hemoglobin A1C: 6.4 % — AB (ref 4.0–5.6)

## 2021-07-04 MED ORDER — OMEPRAZOLE 40 MG PO CPDR: DELAYED_RELEASE_CAPSULE | ORAL | 1 refills | Status: DC

## 2021-07-04 NOTE — Assessment & Plan Note (Signed)
Lab Results  Component Value Date   HGBA1C 6.4 (A) 07/04/2021   Continues to have good control. Did increase in setting of stopping Jardiance. Cont glipizide, metformin, and tradjenta. F/u 6 months for recheck - if still at goal could consider another medication decrease.

## 2021-07-04 NOTE — Assessment & Plan Note (Addendum)
Stable. Taking omeprazole 40 mg prn. Refill today

## 2021-07-04 NOTE — Patient Instructions (Addendum)
Schedule 6 month follow-up with Tabitha for Diabetes and Transfer of Care - if unable to schedule today, call back in 3 months  If still feeling fatigued/tired after Epo treatment and improvement in blood counts -- schedule follow-up appointment sooner - November with Lawerance Bach

## 2021-07-04 NOTE — Assessment & Plan Note (Signed)
Pt not sure if bilateral. Overdue for eye exam and retinopathy exam. Referral placed to f/u on both issues

## 2021-07-04 NOTE — Progress Notes (Signed)
Subjective:     Alicia [name] is a 75 y.o. female presenting for Follow-up (3 mo DM/Would like to be put on schedule for [facility] eye exam on 07/19/21)     HPI  #Diabetes Currently taking taking metformin 1000 mg bid, glipizide 10 mg and tradjenta 5 mg Using medications without difficulties: No Hypoglycemic episodes:No  Hyperglycemic episodes:No  Feet problems:No  Blood Sugars averaging: 117  Last HgbA1c:  Lab Results  Component Value Date   HGBA1C 6.4 (A) 07/04/2021    Diabetes Health Maintenance Due:    Diabetes Health Maintenance Due  Topic Date Due   OPHTHALMOLOGY EXAM  02/17/2018   HEMOGLOBIN A1C  01/04/2022   FOOT EXAM  03/29/2022   #Fatigue - still feeling tired - following with hematology with hgb 9 - is going to start epo soon   Review of Systems   Social History   Tobacco Use  Smoking Status Never  Smokeless Tobacco Never        Objective:    BP Readings from Last 3 Encounters:  07/04/21 122/70  06/13/21 131/61  05/26/21 (!) 155/63   Wt Readings from Last 3 Encounters:  07/04/21 (!) 303 lb 8 oz (137.7 kg)  06/13/21 (!) 306 lb 9.6 oz (139.1 kg)  04/04/21 (!) 307 lb 8 oz (139.5 kg)    BP 122/70   Pulse 63   Temp 97.6 F (36.4 C) (Temporal)   Ht 5\' 5"  (1.651 m)   Wt (!) 303 lb 8 oz (137.7 kg)   SpO2 99%   BMI 50.51 kg/m    Physical Exam Constitutional:      General: She is not in acute distress.    Appearance: She is well-developed. She is not diaphoretic.  HENT:     Right Ear: External ear normal.     Left Ear: External ear normal.  Eyes:     Conjunctiva/sclera: Conjunctivae normal.  Cardiovascular:     Rate and Rhythm: Normal rate and regular rhythm.     Heart sounds: No murmur heard. Pulmonary:     Effort: Pulmonary effort is normal. No respiratory distress.     Breath sounds: Normal breath sounds. No wheezing.  Musculoskeletal:     Cervical back: Neck supple.  Skin:    General: Skin is warm and dry.     Capillary  Refill: Capillary refill takes less than 2 seconds.  Neurological:     Mental Status: She is alert. Mental status is at baseline.  Psychiatric:        Mood and Affect: Mood normal.        Behavior: Behavior normal.          Assessment & Plan:   Problem List Items Addressed This Visit       Digestive   GERD (gastroesophageal reflux disease)    Stable. Taking omeprazole 40 mg prn. Refill today      Relevant Medications   omeprazole (PRILOSEC) 40 MG capsule     Endocrine   Type 2 diabetes mellitus without complication, without long-term current use of insulin ([place]) - Primary    Lab Results  Component Value Date   HGBA1C 6.4 (A) 07/04/2021  Continues to have good control. Did increase in setting of stopping Jardiance. Cont glipizide, metformin, and tradjenta. F/u 6 months for recheck - if still at goal could consider another medication decrease.       Relevant Orders   POCT glycosylated hemoglobin (Hb A1C) (Completed)   Ambulatory referral to Ophthalmology  Other   Iron deficiency anemia    Suspect anemia is the cause of fatigue. Cont care with hematology and f/u if fatigue does not improve with treatment.       Cataracts, bilateral    Pt not sure if bilateral. Overdue for eye exam and retinopathy exam. Referral placed to f/u on both issues      Relevant Orders   Ambulatory referral to Ophthalmology     Return in about 6 months (around 01/04/2022) for for DM with Tabitha as TOC.  Lesleigh Noe, MD  This visit occurred during the SARS-CoV-2 public health emergency.  Safety protocols were in place, including screening questions prior to the visit, additional usage of staff PPE, and extensive cleaning of exam room while observing appropriate contact time as indicated for disinfecting solutions.

## 2021-07-04 NOTE — Assessment & Plan Note (Signed)
Suspect anemia is the cause of fatigue. Cont care with hematology and f/u if fatigue does not improve with treatment.

## 2021-07-05 ENCOUNTER — Encounter (HOSPITAL_COMMUNITY)
Admission: RE | Admit: 2021-07-05 | Discharge: 2021-07-05 | Disposition: A | Discharge status: Home / Self Care | Payer: Medicare PPO | Source: Ambulatory Visit | Attending: Nephrology | Admitting: Nephrology

## 2021-07-05 DIAGNOSIS — D509 Iron deficiency anemia, unspecified: Secondary | ICD-10-CM | POA: Insufficient documentation

## 2021-07-05 DIAGNOSIS — N183 Chronic kidney disease, stage 3 unspecified: Secondary | ICD-10-CM | POA: Diagnosis not present

## 2021-07-05 DIAGNOSIS — D631 Anemia in chronic kidney disease: Secondary | ICD-10-CM | POA: Insufficient documentation

## 2021-07-05 DIAGNOSIS — D508 Other iron deficiency anemias: Secondary | ICD-10-CM | POA: Insufficient documentation

## 2021-07-05 LAB — POCT HEMOGLOBIN-HEMACUE: Hemoglobin: 9.6 g/dL — ABNORMAL LOW (ref 12.0–15.0)

## 2021-07-05 MED ORDER — EPOETIN ALFA-EPBX 10000 UNIT/ML IJ SOLN
INTRAMUSCULAR | Status: AC
  Filled 2021-07-05: qty 2

## 2021-07-05 MED ORDER — EPOETIN ALFA-EPBX 10000 UNIT/ML IJ SOLN
20000.0000 [IU] | INTRAMUSCULAR | Status: DC
  Administered 2021-07-05: 20000 [IU] via SUBCUTANEOUS

## 2021-07-13 ENCOUNTER — Encounter: Payer: Self-pay | Admitting: *Deleted

## 2021-07-18 ENCOUNTER — Other Ambulatory Visit: Payer: Self-pay

## 2021-07-18 DIAGNOSIS — E119 Type 2 diabetes mellitus without complications: Secondary | ICD-10-CM

## 2021-07-19 ENCOUNTER — Encounter (HOSPITAL_COMMUNITY)
Admission: RE | Admit: 2021-07-19 | Discharge: 2021-07-19 | Disposition: A | Discharge status: Home / Self Care | Payer: Medicare PPO | Source: Ambulatory Visit | Attending: Nephrology | Admitting: Nephrology

## 2021-07-19 ENCOUNTER — Other Ambulatory Visit (HOSPITAL_COMMUNITY): Payer: Self-pay | Admitting: *Deleted

## 2021-07-19 VITALS — BP 140/78 | HR 65 | Temp 98.1°F | Resp 17

## 2021-07-19 DIAGNOSIS — N183 Chronic kidney disease, stage 3 unspecified: Secondary | ICD-10-CM | POA: Diagnosis not present

## 2021-07-19 DIAGNOSIS — D631 Anemia in chronic kidney disease: Secondary | ICD-10-CM | POA: Diagnosis not present

## 2021-07-19 DIAGNOSIS — D508 Other iron deficiency anemias: Secondary | ICD-10-CM

## 2021-07-19 DIAGNOSIS — D509 Iron deficiency anemia, unspecified: Secondary | ICD-10-CM

## 2021-07-19 LAB — POCT HEMOGLOBIN-HEMACUE: Hemoglobin: 9.9 g/dL — ABNORMAL LOW (ref 12.0–15.0)

## 2021-07-19 MED ORDER — EPOETIN ALFA-EPBX 10000 UNIT/ML IJ SOLN
20000.0000 [IU] | INTRAMUSCULAR | Status: DC
  Administered 2021-07-19: 20000 [IU] via SUBCUTANEOUS

## 2021-07-19 MED ORDER — EPOETIN ALFA-EPBX 10000 UNIT/ML IJ SOLN
INTRAMUSCULAR | Status: AC
  Filled 2021-07-19: qty 2

## 2021-07-20 ENCOUNTER — Other Ambulatory Visit: Payer: Self-pay

## 2021-07-20 DIAGNOSIS — E119 Type 2 diabetes mellitus without complications: Secondary | ICD-10-CM

## 2021-07-21 ENCOUNTER — Other Ambulatory Visit: Payer: Self-pay

## 2021-07-21 DIAGNOSIS — E119 Type 2 diabetes mellitus without complications: Secondary | ICD-10-CM

## 2021-07-21 MED ORDER — METFORMIN HCL 1000 MG PO TABS
1000.0000 mg | ORAL_TABLET | Freq: Two times a day (BID) | ORAL | 1 refills | Status: DC
Start: 2021-07-21 — End: 2022-03-15

## 2021-07-27 ENCOUNTER — Other Ambulatory Visit: Payer: Self-pay | Admitting: Family Medicine

## 2021-07-27 DIAGNOSIS — E119 Type 2 diabetes mellitus without complications: Secondary | ICD-10-CM

## 2021-07-28 NOTE — Telephone Encounter (Signed)
[facility] - Client TELEPHONE ADVICE RECORD [facility] Patient Name: Alicia [name] Gender: Female DOB: Sep 05, 1946 Age: 75 Y 10 M 6 D Return Phone Number: [phone] (Primary) Address: City/ State/ Zip: [place] [address] Client [place] [facility] - Client Client Site [facility] - Day Physician Waunita Schooner- MD Contact Type Call Who Is Calling Patient / Member / Family / Caregiver Call Type Triage / Clinical Relationship To Patient Self Return Phone Number [phone] (Primary) Chief Complaint Prescription Refill or Medication Request (non symptomatic) Reason for Call Medication Question / Request Initial Comment Caller says that she ran out of her Metformin; she has been out for a week. The pharmacy has not heard on the request they sent. Sees Dr. Janett Billow Day at [facility] but when she calls they say they are closed for 12 weeks and they transferred her to this #. NOTE: Call came under [employer]. I called the back line and somebody there said to fax normally to [facility] because somebody will receive it. They will reopen on Monday. Translation No Nurse Assessment Nurse: Alba Destine, RN, Melony Overly Date/Time Eilene Ghazi Time): 07/28/2021 10:06:12 AM Please select the assessment type ---Refill Additional Documentation ---refill for Metformin Does the patient have enough medication to last until the office opens? ---No Additional Documentation ---[place] [phone]. Disp. Time Eilene Ghazi Time) Disposition Final User 07/28/2021 9:54:40 AM Attempt made - line busy Alba Destine, RN, Melony Overly 07/28/2021 10:07:33 AM Clinical Call Yes Alba Destine, RN, Melony Overly Comments User: Baxter Flattery, RN Date/Time Eilene Ghazi Time): 07/28/2021 9:55:24 AM line busy. does not ring. no response. User: Baxter Flattery, RN Date/Time Eilene Ghazi Time): 07/28/2021 10:07:57 AM PLEASE NOTE: All timestamps contained within this report  are represented as [place] Standard Time. CONFIDENTIALTY NOTICE: This fax transmission is intended only for the addressee. It contains information that is legally privileged, confidential or otherwise protected from use or disclosure. If you are not the intended recipient, you are strictly prohibited from reviewing, disclosing, copying using or disseminating any of this information or taking any action in reliance on or regarding this information. If you have received this fax in error, please notify us immediately by telephone so that we can arrange for its return to [place]. Phone: [phone], Toll-Free: [phone], Fax: [phone] Page: 2 of 2 Call Id: [id number] Comments pls send refill on Monday

## 2021-07-28 NOTE — Telephone Encounter (Signed)
Spoke to pt's pharmacy and they stated that it was filled and ready for pickup. Called the pt and told her. Pt states that she has tried 5 times to pick up Rx and they always tell her they don't have it.  I told pt that if she goes up there today and they tell her that again, to please call me while she is there so I can call pharmacy again.

## 2021-08-02 ENCOUNTER — Encounter (HOSPITAL_COMMUNITY)
Admission: RE | Admit: 2021-08-02 | Discharge: 2021-08-02 | Disposition: A | Discharge status: Home / Self Care | Payer: Medicare PPO | Source: Ambulatory Visit | Attending: Nephrology | Admitting: Nephrology

## 2021-08-02 ENCOUNTER — Other Ambulatory Visit: Payer: Self-pay

## 2021-08-02 VITALS — BP 134/80 | HR 64 | Temp 98.3°F | Resp 18

## 2021-08-02 DIAGNOSIS — N183 Chronic kidney disease, stage 3 unspecified: Secondary | ICD-10-CM | POA: Diagnosis not present

## 2021-08-02 DIAGNOSIS — D509 Iron deficiency anemia, unspecified: Secondary | ICD-10-CM

## 2021-08-02 DIAGNOSIS — D631 Anemia in chronic kidney disease: Secondary | ICD-10-CM | POA: Diagnosis not present

## 2021-08-02 DIAGNOSIS — D508 Other iron deficiency anemias: Secondary | ICD-10-CM

## 2021-08-02 LAB — IRON AND TIBC
Iron: 68 ug/dL (ref 28–170)
Saturation Ratios: 24 % (ref 10.4–31.8)
TIBC: 287 ug/dL (ref 250–450)
UIBC: 219 ug/dL

## 2021-08-02 LAB — RENAL FUNCTION PANEL
Albumin: 3.5 g/dL (ref 3.5–5.0)
Anion gap: 8 (ref 5–15)
BUN: 23 mg/dL (ref 8–23)
CO2: 25 mmol/L (ref 22–32)
Calcium: 8.8 mg/dL — ABNORMAL LOW (ref 8.9–10.3)
Chloride: 106 mmol/L (ref 98–111)
Creatinine, Ser: 1.93 mg/dL — ABNORMAL HIGH (ref 0.44–1.00)
GFR, Estimated: 27 mL/min — ABNORMAL LOW (ref >60)
Glucose, Bld: 154 mg/dL — ABNORMAL HIGH (ref 70–99)
Phosphorus: 4.1 mg/dL (ref 2.5–4.6)
Potassium: 4.8 mmol/L (ref 3.5–5.1)
Sodium: 139 mmol/L (ref 135–145)

## 2021-08-02 LAB — POCT HEMOGLOBIN-HEMACUE: Hemoglobin: 10.1 g/dL — ABNORMAL LOW (ref 12.0–15.0)

## 2021-08-02 LAB — FERRITIN: Ferritin: 172 ng/mL (ref 11–307)

## 2021-08-02 MED ORDER — EPOETIN ALFA-EPBX 10000 UNIT/ML IJ SOLN
INTRAMUSCULAR | Status: AC
  Administered 2021-08-02: 20000 [IU] via SUBCUTANEOUS
  Filled 2021-08-02: qty 2

## 2021-08-02 MED ORDER — EPOETIN ALFA-EPBX 10000 UNIT/ML IJ SOLN: 20000.0000 [IU] | INTRAMUSCULAR | Status: DC

## 2021-08-03 LAB — PTH, INTACT AND CALCIUM
Calcium, Total (PTH): 8.9 mg/dL (ref 8.7–10.3)
PTH: 35 pg/mL (ref 15–65)

## 2021-08-07 DIAGNOSIS — Z6841 Body Mass Index (BMI) 40.0 and over, adult: Secondary | ICD-10-CM | POA: Diagnosis not present

## 2021-08-07 DIAGNOSIS — I25119 Atherosclerotic heart disease of native coronary artery with unspecified angina pectoris: Secondary | ICD-10-CM | POA: Diagnosis not present

## 2021-08-07 DIAGNOSIS — E1151 Type 2 diabetes mellitus with diabetic peripheral angiopathy without gangrene: Secondary | ICD-10-CM | POA: Diagnosis not present

## 2021-08-07 DIAGNOSIS — G8929 Other chronic pain: Secondary | ICD-10-CM | POA: Diagnosis not present

## 2021-08-07 DIAGNOSIS — D509 Iron deficiency anemia, unspecified: Secondary | ICD-10-CM | POA: Diagnosis not present

## 2021-08-07 DIAGNOSIS — N184 Chronic kidney disease, stage 4 (severe): Secondary | ICD-10-CM | POA: Diagnosis not present

## 2021-08-07 DIAGNOSIS — E785 Hyperlipidemia, unspecified: Secondary | ICD-10-CM | POA: Diagnosis not present

## 2021-08-07 DIAGNOSIS — Z91041 Radiographic dye allergy status: Secondary | ICD-10-CM | POA: Diagnosis not present

## 2021-08-07 DIAGNOSIS — E1122 Type 2 diabetes mellitus with diabetic chronic kidney disease: Secondary | ICD-10-CM | POA: Diagnosis not present

## 2021-08-13 ENCOUNTER — Other Ambulatory Visit: Payer: Self-pay | Admitting: Internal Medicine

## 2021-08-16 ENCOUNTER — Other Ambulatory Visit: Payer: Self-pay

## 2021-08-16 ENCOUNTER — Encounter (HOSPITAL_COMMUNITY)
Admission: RE | Admit: 2021-08-16 | Discharge: 2021-08-16 | Disposition: A | Discharge status: Home / Self Care | Payer: Medicare PPO | Source: Ambulatory Visit | Attending: Nephrology | Admitting: Nephrology

## 2021-08-16 VITALS — BP 145/77 | HR 65 | Temp 97.1°F | Resp 18

## 2021-08-16 DIAGNOSIS — D508 Other iron deficiency anemias: Secondary | ICD-10-CM

## 2021-08-16 DIAGNOSIS — N183 Chronic kidney disease, stage 3 unspecified: Secondary | ICD-10-CM | POA: Diagnosis not present

## 2021-08-16 DIAGNOSIS — D631 Anemia in chronic kidney disease: Secondary | ICD-10-CM | POA: Diagnosis not present

## 2021-08-16 DIAGNOSIS — D509 Iron deficiency anemia, unspecified: Secondary | ICD-10-CM

## 2021-08-16 LAB — POCT HEMOGLOBIN-HEMACUE: Hemoglobin: 11.3 g/dL — ABNORMAL LOW (ref 12.0–15.0)

## 2021-08-16 MED ORDER — EPOETIN ALFA-EPBX 10000 UNIT/ML IJ SOLN
INTRAMUSCULAR | Status: AC
  Administered 2021-08-16: 20000 [IU] via SUBCUTANEOUS
  Filled 2021-08-16: qty 2

## 2021-08-16 MED ORDER — EPOETIN ALFA-EPBX 10000 UNIT/ML IJ SOLN: 20000.0000 [IU] | INTRAMUSCULAR | Status: DC

## 2021-08-27 ENCOUNTER — Other Ambulatory Visit: Payer: Self-pay | Admitting: Internal Medicine

## 2021-08-30 ENCOUNTER — Encounter (HOSPITAL_COMMUNITY)
Admission: RE | Admit: 2021-08-30 | Discharge: 2021-08-30 | Disposition: A | Discharge status: Home / Self Care | Payer: Medicare PPO | Source: Ambulatory Visit | Attending: Nephrology | Admitting: Nephrology

## 2021-08-30 VITALS — BP 150/72 | HR 62 | Temp 97.0°F | Resp 18

## 2021-08-30 DIAGNOSIS — D631 Anemia in chronic kidney disease: Secondary | ICD-10-CM | POA: Insufficient documentation

## 2021-08-30 DIAGNOSIS — D508 Other iron deficiency anemias: Secondary | ICD-10-CM

## 2021-08-30 DIAGNOSIS — N183 Chronic kidney disease, stage 3 unspecified: Secondary | ICD-10-CM | POA: Diagnosis not present

## 2021-08-30 DIAGNOSIS — D509 Iron deficiency anemia, unspecified: Secondary | ICD-10-CM

## 2021-08-30 LAB — RENAL FUNCTION PANEL
Albumin: 3.7 g/dL (ref 3.5–5.0)
Anion gap: 10 (ref 5–15)
BUN: 24 mg/dL — ABNORMAL HIGH (ref 8–23)
CO2: 23 mmol/L (ref 22–32)
Calcium: 8.7 mg/dL — ABNORMAL LOW (ref 8.9–10.3)
Chloride: 106 mmol/L (ref 98–111)
Creatinine, Ser: 1.87 mg/dL — ABNORMAL HIGH (ref 0.44–1.00)
GFR, Estimated: 28 mL/min — ABNORMAL LOW (ref >60)
Glucose, Bld: 95 mg/dL (ref 70–99)
Phosphorus: 3.6 mg/dL (ref 2.5–4.6)
Potassium: 4.3 mmol/L (ref 3.5–5.1)
Sodium: 139 mmol/L (ref 135–145)

## 2021-08-30 LAB — IRON AND TIBC
Iron: 50 ug/dL (ref 28–170)
Saturation Ratios: 17 % (ref 10.4–31.8)
TIBC: 287 ug/dL (ref 250–450)
UIBC: 237 ug/dL

## 2021-08-30 LAB — FERRITIN: Ferritin: 143 ng/mL (ref 11–307)

## 2021-08-30 LAB — POCT HEMOGLOBIN-HEMACUE: Hemoglobin: 11.3 g/dL — ABNORMAL LOW (ref 12.0–15.0)

## 2021-08-30 MED ORDER — EPOETIN ALFA-EPBX 10000 UNIT/ML IJ SOLN
20000.0000 [IU] | INTRAMUSCULAR | Status: DC
  Administered 2021-08-30: 20000 [IU] via SUBCUTANEOUS

## 2021-08-30 MED ORDER — EPOETIN ALFA-EPBX 10000 UNIT/ML IJ SOLN
INTRAMUSCULAR | Status: AC
  Filled 2021-08-30: qty 2

## 2021-08-31 LAB — PTH, INTACT AND CALCIUM
Calcium, Total (PTH): 8.6 mg/dL — ABNORMAL LOW (ref 8.7–10.3)
PTH: 66 pg/mL — ABNORMAL HIGH (ref 15–65)

## 2021-09-01 ENCOUNTER — Other Ambulatory Visit: Payer: Self-pay | Admitting: Family

## 2021-09-05 ENCOUNTER — Encounter: Payer: Self-pay | Admitting: Nurse Practitioner

## 2021-09-05 ENCOUNTER — Ambulatory Visit: Payer: Medicare PPO | Admitting: Nurse Practitioner

## 2021-09-05 ENCOUNTER — Other Ambulatory Visit: Payer: Self-pay

## 2021-09-05 VITALS — BP 134/76 | HR 76 | Temp 96.7°F | Resp 14 | Ht 64.0 in | Wt 310.0 lb

## 2021-09-05 DIAGNOSIS — R600 Localized edema: Secondary | ICD-10-CM | POA: Diagnosis not present

## 2021-09-05 DIAGNOSIS — Z23 Encounter for immunization: Secondary | ICD-10-CM

## 2021-09-05 DIAGNOSIS — R252 Cramp and spasm: Secondary | ICD-10-CM | POA: Diagnosis not present

## 2021-09-05 DIAGNOSIS — I6300 Cerebral infarction due to thrombosis of unspecified precerebral artery: Secondary | ICD-10-CM

## 2021-09-05 DIAGNOSIS — E119 Type 2 diabetes mellitus without complications: Secondary | ICD-10-CM

## 2021-09-05 DIAGNOSIS — R202 Paresthesia of skin: Secondary | ICD-10-CM

## 2021-09-05 DIAGNOSIS — G4733 Obstructive sleep apnea (adult) (pediatric): Secondary | ICD-10-CM | POA: Diagnosis not present

## 2021-09-05 DIAGNOSIS — K219 Gastro-esophageal reflux disease without esophagitis: Secondary | ICD-10-CM | POA: Diagnosis not present

## 2021-09-05 DIAGNOSIS — I10 Essential (primary) hypertension: Secondary | ICD-10-CM | POA: Diagnosis not present

## 2021-09-05 DIAGNOSIS — D508 Other iron deficiency anemias: Secondary | ICD-10-CM | POA: Diagnosis not present

## 2021-09-05 DIAGNOSIS — E785 Hyperlipidemia, unspecified: Secondary | ICD-10-CM

## 2021-09-05 LAB — COMPREHENSIVE METABOLIC PANEL
ALT: 8 U/L (ref 0–35)
AST: 12 U/L (ref 0–37)
Albumin: 3.9 g/dL (ref 3.5–5.2)
Alkaline Phosphatase: 101 U/L (ref 39–117)
BUN: 27 mg/dL — ABNORMAL HIGH (ref 6–23)
CO2: 27 mEq/L (ref 19–32)
Calcium: 8.8 mg/dL (ref 8.4–10.5)
Chloride: 102 mEq/L (ref 96–112)
Creatinine, Ser: 2.02 mg/dL — ABNORMAL HIGH (ref 0.40–1.20)
GFR: 23.96 mL/min — ABNORMAL LOW (ref >60.00)
Glucose, Bld: 84 mg/dL (ref 70–99)
Potassium: 4.8 mEq/L (ref 3.5–5.1)
Sodium: 138 mEq/L (ref 135–145)
Total Bilirubin: 0.5 mg/dL (ref 0.2–1.2)
Total Protein: 6.4 g/dL (ref 6.0–8.3)

## 2021-09-05 LAB — CBC
HCT: 35.9 % — ABNORMAL LOW (ref 36.0–46.0)
Hemoglobin: 11.2 g/dL — ABNORMAL LOW (ref 12.0–15.0)
MCHC: 31.1 g/dL (ref 30.0–36.0)
MCV: 71.7 fl — ABNORMAL LOW (ref 78.0–100.0)
Platelets: 260 10*3/uL (ref 150.0–400.0)
RBC: 5 Mil/uL (ref 3.87–5.11)
RDW: 16.8 % — ABNORMAL HIGH (ref 11.5–15.5)
WBC: 4.6 10*3/uL (ref 4.0–10.5)

## 2021-09-05 LAB — BRAIN NATRIURETIC PEPTIDE: Pro B Natriuretic peptide (BNP): 174 pg/mL — ABNORMAL HIGH (ref 0.0–100.0)

## 2021-09-05 LAB — MAGNESIUM: Magnesium: 1.3 mg/dL — ABNORMAL LOW (ref 1.5–2.5)

## 2021-09-05 NOTE — Patient Instructions (Signed)
Nice to see you today Will check some basic blood work Will see you in 2 months

## 2021-09-05 NOTE — Assessment & Plan Note (Signed)
History of stroke.  Patient was given tPA in the hospital.  No deficits per patient report on physical exam.  Patient is on atorvastatin, aspirin, Repatha, several antihypertensives as listed above.  Continue medications as prescribed

## 2021-09-05 NOTE — Assessment & Plan Note (Signed)
States she was told this in the hospital but not meet criteria for CPAP.

## 2021-09-05 NOTE — Assessment & Plan Note (Signed)
Patient is Describes bilateral lower extremity cramping when she gets up in the morning along with a numbness feeling.  States more noticeable over the past 2 weeks we will check labs encourage hydration.  If this continues secondary disease process could be investigated such as neuropathy or MS.  Patient made aware to monitor if no better will pursue neurology referral

## 2021-09-05 NOTE — Assessment & Plan Note (Signed)
Currently maintained and followed by hematology/oncology.  States gets inject infusions every 2 weeks approved through February 2023.  Continue getting infusions as prescribed and following up with hematology/oncology as recommended.  Continue to monitor

## 2021-09-05 NOTE — Assessment & Plan Note (Signed)
Patient currently maintained on glipizide 10 mg, linagliptin, metformin.  She is checking her blood glucose at least daily at home has had 1 incidence of lower blood glucose which she was able to fix with oral management.  Too early to check A1c in office.  We will have patient follow-up in a couple months to see how she is doing with weight and blood glucose levels.  Continue taking medications as described previous.

## 2021-09-05 NOTE — Assessment & Plan Note (Signed)
Patient currently on 2 different fluid medications still having pitting edema in office.  We will check labs inclusive of BNP pending lab result.  Patient has no trouble lying flat and sleeping lungs clear to auscultation in office.

## 2021-09-05 NOTE — Assessment & Plan Note (Signed)
Currently maintained on Repatha.  Continue medication as prescribed and follow-up with cardiology as recommended.

## 2021-09-05 NOTE — Assessment & Plan Note (Signed)
Currently maintained on omeprazole 40 mg once daily.  Patient is established with GI and had endoscopy and colonoscopy in 2021. Continue omeprazole 40 mg.

## 2021-09-05 NOTE — Progress Notes (Signed)
Established Patient Office Visit  Subjective:  Patient ID: Alicia [name], female    DOB: [birth year]  Age: 75 y.o. MRN: [id number]  CC:  Chief Complaint  Patient presents with   Transfer of Care   Leg Pain    X 2 weeks. Started with pain in both knees and now radiating down both legs. Sensation described as some numbness, cramping sensation. Worse in the morning, sensation present all day. Feels like this started after getting Anemia injections at the hospital.    HPI Alicia [name] presents for [facility]  HTN: Does not check blood pressure. Does not think the meter.  DM2: Checks sugars daily. Between breakfast and lunch. Highest 182. Has had low of 62. OSA: Does not use a CPAP. Not bad enough to need a machine. Was evaluated at hospital. CVA: in 2017 approx. Maintained on plavix Anemia: Sees Heme Thayil PA-c on 09/13/2021 last one 08/30/2021 Leg discomfort: approx 2 weeks.States that when she wakes up it feels like cramps or numbness. Nothing makes it nbetter or worse. No injury or fall. States it started after her iron infusion      Past Medical History:  Diagnosis Date   Anemia    Arthritis    "right knee" (10/21/2015)   CAD (coronary artery disease)    a. Cath 10/21/15: s/p DES to RCA: moderate diffuse stenosis of mod diagonal, diffuse irregularity LCx and LAD. 2D echo 10/21/15: mod LVH, EF 60-65%, no RWMA, calcified MV, mod-severe LAE. 03/18/17 PCI with DES--> 1 diag   Cataract    Diabetes ([place])    Essential hypertension    Family history of adverse reaction to anesthesia    "oldest sister, [name], related to brain formation at back of head; when they put her to sleep it's hard to wake her up"   GERD (gastroesophageal reflux disease)    History of blood transfusion    "related to ruptured tubal pregnancy"   Hyperlipidemia    Hypertensive heart disease    Morbid obesity ([place])    Stroke (cerebrum) ([place])    Tubular adenoma of colon    Type II diabetes mellitus ([place])     Past  Surgical History:  Procedure Laterality Date   [place]   BIOPSY  07/18/2020   Procedure: BIOPSY;  Surgeon: [name], MD;  Location: [facility];  Service: Gastroenterology;;   CARDIAC CATHETERIZATION N/A 10/21/2015   Procedure: Right/Left Heart Cath and Coronary Angiography;  Surgeon: [name], MD;  Location: [facility];  Service: Cardiovascular;  Laterality: N/A;   CHOLECYSTECTOMY OPEN  1984   COLONOSCOPY WITH PROPOFOL N/A 07/18/2020   Procedure: COLONOSCOPY WITH PROPOFOL;  Surgeon: [name], MD;  Location: [facility];  Service: Gastroenterology;  Laterality: N/A;   CORONARY STENT INTERVENTION N/A 03/18/2017   Procedure: Coronary Stent Intervention;  Surgeon: [name], MD;  Location: [facility];  Service: Cardiovascular;  Laterality: N/A;   CORONARY STENT PLACEMENT  03/18/2017   A OPTIMIZE STUDY Drug Eluting Stent (2.5 mm 18 mm - post-dilated to 2.7 mm) was successfully placed   ECTOPIC PREGNANCY SURGERY     ENDOSCOPIC MUCOSAL RESECTION N/A 01/23/2021   Procedure: ENDOSCOPIC MUCOSAL RESECTION;  Surgeon: Irving Copas., MD;  Location: [facility];  Service: Gastroenterology;  Laterality: N/A;   ESOPHAGOGASTRODUODENOSCOPY (EGD) WITH PROPOFOL N/A 07/18/2020   Procedure: ESOPHAGOGASTRODUODENOSCOPY (EGD) WITH PROPOFOL;  Surgeon:  Pyrtle, Lajuan Lines, MD;  Location: [facility];  Service: Gastroenterology;  Laterality: N/A;   ESOPHAGOGASTRODUODENOSCOPY (EGD) WITH PROPOFOL N/A 01/23/2021   Procedure: ESOPHAGOGASTRODUODENOSCOPY (EGD) WITH PROPOFOL;  Surgeon: Rush Landmark Telford Nab., MD;  Location: [facility];  Service: Gastroenterology;  Laterality: N/A;   EUS N/A 01/23/2021   Procedure: UPPER ENDOSCOPIC ULTRASOUND (EUS) RADIAL;  Surgeon: Irving Copas., MD;  Location: [facility];  Service: Gastroenterology;  Laterality: N/A;   HEMOSTASIS CLIP PLACEMENT  01/23/2021    Procedure: HEMOSTASIS CLIP PLACEMENT;  Surgeon: Irving Copas., MD;  Location: [facility];  Service: Gastroenterology;;   INTRAVASCULAR PRESSURE WIRE/FFR STUDY N/A 09/04/2019   Procedure: INTRAVASCULAR PRESSURE WIRE/FFR STUDY;  Surgeon: Nelva Bush, MD;  Location: [facility];  Service: Cardiovascular;  Laterality: N/A;   LEFT HEART CATH AND CORONARY ANGIOGRAPHY N/A 03/18/2017   Procedure: Left Heart Cath and Coronary Angiography;  Surgeon: [name], MD;  Location: [facility];  Service: Cardiovascular;  Laterality: N/A;   LEFT HEART CATH AND CORONARY ANGIOGRAPHY N/A 09/04/2019   Procedure: LEFT HEART CATH AND CORONARY ANGIOGRAPHY;  Surgeon: Nelva Bush, MD;  Location: [facility];  Service: Cardiovascular;  Laterality: N/A;   POLYPECTOMY  07/18/2020   Procedure: POLYPECTOMY;  Surgeon: [name], MD;  Location: [facility];  Service: Gastroenterology;;   REDUCTION MAMMAPLASTY Bilateral ~ [place] INJECTION  01/23/2021   Procedure: SUBMUCOSAL LIFTING INJECTION;  Surgeon: Irving Copas., MD;  Location: [facility];  Service: Gastroenterology;;   TONSILLECTOMY      Family History  Problem Relation Age of Onset   Diabetes Mother    Heart attack Mother 76       2007   Hypertension Mother    Hyperlipidemia Mother    Heart disease Mother    Heart attack Father    Stroke Father 4   Hypertension Father    Hyperlipidemia Father    Stomach cancer Sister        in the cavity behind the stomach   Diabetes Sister    Diabetes Sister    Diabetes Sister    Hypertension Son        weight loss corrected that   Migraines Daughter        brain surgery   Migraines Daughter    Breast cancer Neg Hx    Colon cancer Neg Hx    Inflammatory bowel disease Neg Hx    Liver disease Neg Hx    Esophageal cancer Neg Hx    Pancreatic cancer Neg Hx    Rectal cancer Neg Hx     Social History   Socioeconomic History   Marital  status: Divorced    Spouse name: Not on file   Number of children: 3   Years of education: doctorate   Highest education level: Not on file  Occupational History   Occupation: Teacher/retired  Tobacco Use   Smoking status: Never   Smokeless tobacco: Never  Vaping Use   Vaping Use: Never used  Substance and Sexual Activity   Alcohol use: No   Drug use: No   Sexual activity: Not Currently  Other Topics Concern   Not on file  Social History Narrative   Caffeine use-yes   Regular exercise-no   Social Determinants of Health   Financial Resource Strain: Not on file  Food Insecurity: Not on file  Transportation Needs: Not on file  Physical Activity: Not on file  Stress: Not on file  Social Connections: Not on file  Intimate Partner Violence: Not on file    Outpatient Medications Prior to Visit  Medication Sig Dispense Refill   amLODipine (NORVASC) 10 MG tablet TAKE 1 TABLET BY MOUTH EVERY DAY 90 tablet 3   aspirin EC 81 MG tablet Take 1 tablet (81 mg total) by mouth daily. Swallow whole. 30 tablet 11   atorvastatin (LIPITOR) 80 MG tablet TAKE 1 TABLET BY MOUTH DAILY AT 6 PM. (Patient taking differently: Take 80 mg by mouth at bedtime.) 90 tablet 3   carvedilol (COREG) 25 MG tablet TAKE 2 TABLETS (50 MG TOTAL) BY MOUTH 2 (TWO) TIMES DAILY WITH A MEAL. 360 tablet 3   Cholecalciferol 50 MCG (2000 UT) TABS Take one tablet daily alternating with two tablets daily 30 tablet    clopidogrel (PLAVIX) 75 MG tablet Take 1 tablet (75 mg total) by mouth daily. 90 tablet 3   doxylamine, Sleep, (UNISOM) 25 MG tablet Take 25 mg by mouth at bedtime.     Evolocumab (REPATHA SURECLICK) 027 MG/ML SOAJ Inject 1 pen into the skin every 14 (fourteen) days. (Patient taking differently: Inject 140 mg into the skin every 14 (fourteen) days. Thursday) 2 mL 11   ferrous sulfate 325 (65 FE) MG tablet TAKE 1 TABLET BY MOUTH EVERY DAY WITH BREAKFAST 90 tablet 0   furosemide (LASIX) 20 MG tablet TAKE 1 TABLET  BY MOUTH EVERY DAY 90 tablet 2   glipiZIDE (GLUCOTROL) 10 MG tablet Take 1 tablet (10 mg total) by mouth 2 (two) times daily before a meal. 180 tablet 1   glucose blood (ONETOUCH ULTRA) test strip 1 each by Other route 2 (two) times daily. LAST REFILL SCHEDULE PHYSICAL 200 strip 0   isosorbide mononitrate (IMDUR) 60 MG 24 hr tablet TAKE 1 TABLET BY MOUTH EVERY DAY 90 tablet 2   linagliptin (TRADJENTA) 5 MG TABS tablet TAKE 1 TABLET (5 MG TOTAL) BY MOUTH DAILY. 90 tablet 1   losartan (COZAAR) 100 MG tablet Take 1 tablet (100 mg total) by mouth daily. 90 tablet 1   losartan (COZAAR) 50 MG tablet Take 1 tablet (50 mg total) by mouth daily. 90 tablet 3   metFORMIN (GLUCOPHAGE) 1000 MG tablet Take 1 tablet (1,000 mg total) by mouth 2 (two) times daily with a meal. 180 tablet 1   nitroGLYCERIN (NITROSTAT) 0.4 MG SL tablet Place 1 tablet (0.4 mg total) under the tongue every 5 (five) minutes as needed for chest pain. 25 tablet 3   omeprazole (PRILOSEC) 40 MG capsule TAKE 1 CAPSULE BY MOUTH EVERY DAY 90 capsule 1   ondansetron (ZOFRAN-ODT) 4 MG disintegrating tablet TAKE 1 TABLET BY MOUTH EVERY 8 HOURS AS NEEDED FOR NAUSEA AND VOMITING 30 tablet 0   spironolactone (ALDACTONE) 25 MG tablet TAKE 1 TABLET BY MOUTH EVERY DAY 90 tablet 2   vitamin B-12 (CYANOCOBALAMIN) 500 MCG tablet Take 2 tablets (1,000 mcg total) by mouth 2 (two) times daily. 120 tablet 2   Vitamin D, Ergocalciferol, (DRISDOL) 1.25 MG (50000 UNIT) CAPS capsule Take 1 capsule (50,000 Units total) by mouth every 7 (seven) days. Please return for vit d level check on 02/10/21. 8 capsule 0   No facility-administered medications prior to visit.    Allergies  Allergen Reactions   Iodinated Diagnostic Agents Hives   Sulfa Antibiotics Hives    ROS Review of Systems  Constitutional:  Negative for chills and fever.  Respiratory:  Positive for shortness of breath. Negative for cough.  Cardiovascular:  Positive for leg swelling. Negative for  chest pain.  Gastrointestinal:  Negative for diarrhea, nausea and vomiting.  Genitourinary:  Negative for dysuria and hematuria.       Nocturia x2  Musculoskeletal:  Positive for arthralgias.  Skin:  Negative for pallor.  Neurological:  Positive for numbness. Negative for facial asymmetry and weakness.     Objective:    Physical Exam Vitals and nursing note reviewed.  Constitutional:      Appearance: She is obese.  HENT:     Right Ear: Tympanic membrane, ear canal and external ear normal. There is no impacted cerumen.     Left Ear: Tympanic membrane, ear canal and external ear normal. There is no impacted cerumen.     Mouth/Throat:     Mouth: Mucous membranes are moist.     Pharynx: Oropharynx is clear.  Eyes:     Extraocular Movements: Extraocular movements intact.     Pupils: Pupils are equal, round, and reactive to light.     Comments: Wears glasses  Neck:     Thyroid: No thyroid mass, thyromegaly or thyroid tenderness.  Cardiovascular:     Rate and Rhythm: Normal rate and regular rhythm.     Pulses:          Dorsalis pedis pulses are 1+ on the right side and 1+ on the left side.  Pulmonary:     Effort: Pulmonary effort is normal.     Breath sounds: Normal breath sounds.  Abdominal:     General: Bowel sounds are normal.  Musculoskeletal:     Right lower leg: 2+ Pitting Edema present.     Left lower leg: 2+ Pitting Edema present.  Lymphadenopathy:     Cervical: No cervical adenopathy.  Neurological:     Mental Status: She is alert. Mental status is at baseline.     Sensory: Sensory deficit present.     Comments: Decreased feeling in the lateral lower leges. Left worse then right. Unsure of total time going on  Bilateral upper and lower extremity strength 5/5  Psychiatric:        Mood and Affect: Mood normal.        Behavior: Behavior normal.        Thought Content: Thought content normal.        Judgment: Judgment normal.    There were no vitals taken for this  visit. Wt Readings from Last 3 Encounters:  07/04/21 (!) 303 lb 8 oz (137.7 kg)  06/13/21 (!) 306 lb 9.6 oz (139.1 kg)  04/04/21 (!) 307 lb 8 oz (139.5 kg)     Health Maintenance Due  Topic Date Due   Zoster Vaccines- Shingrix (1 of 2) Never done   DEXA SCAN  Never done   OPHTHALMOLOGY EXAM  02/17/2018   COVID-19 Vaccine (4 - Booster for Pfizer series) 01/18/2021   INFLUENZA VACCINE  06/19/2021   MAMMOGRAM  07/19/2021    There are no preventive care reminders to display for this patient.  Lab Results  Component Value Date   TSH 2.040 12/12/2020   Lab Results  Component Value Date   WBC 5.0 06/13/2021   HGB 11.3 (L) 08/30/2021   HCT 30.8 (L) 06/13/2021   MCV 75.7 (L) 06/13/2021   PLT 241 06/13/2021   Lab Results  Component Value Date   NA 139 08/30/2021   K 4.3 08/30/2021   CO2 23 08/30/2021   GLUCOSE 95 08/30/2021   BUN 24 (H) 08/30/2021  CREATININE 1.87 (H) 08/30/2021   BILITOT 0.4 06/13/2021   ALKPHOS 101 06/13/2021   AST 14 (L) 06/13/2021   ALT 9 06/13/2021   PROT 6.4 (L) 06/13/2021   ALBUMIN 3.7 08/30/2021   CALCIUM 8.6 (L) 08/30/2021   CALCIUM 8.7 (L) 08/30/2021   ANIONGAP 10 08/30/2021   GFR 21.94 (L) 03/29/2021   Lab Results  Component Value Date   CHOL 116 03/29/2021   Lab Results  Component Value Date   HDL 44.00 03/29/2021   Lab Results  Component Value Date   LDLCALC 58 03/29/2021   Lab Results  Component Value Date   TRIG 67.0 03/29/2021   Lab Results  Component Value Date   CHOLHDL 3 03/29/2021   Lab Results  Component Value Date   HGBA1C 6.4 (A) 07/04/2021      Assessment & Plan:   Problem List Items Addressed This Visit       Cardiovascular and Mediastinum   Essential hypertension - Primary    Blood pressure within normal limits in office today.  Patient states that she does have a wrist cuff at home but does not think is reliable working on getting upper arm cuff for more reliable at home blood pressure monitoring.   Patient currently maintained on spironolactone, losartan, isosorbide mononitrate, furosemide, carvedilol, amlodipine.  Patient is followed by Dr. [name] loss through cardiology.  Continue following up with her as scheduled and recommended continue medication as prescribed.      Relevant Orders   CBC   Comprehensive metabolic panel   Stroke [facility]) status post IV TPA    History of stroke.  Patient was given tPA in the hospital.  No deficits per patient report on physical exam.  Patient is on atorvastatin, aspirin, Repatha, several antihypertensives as listed above.  Continue medications as prescribed        Respiratory   OSA (obstructive sleep apnea)    States she was told this in the hospital but not meet criteria for CPAP.        Digestive   GERD (gastroesophageal reflux disease)    Currently maintained on omeprazole 40 mg once daily.  Patient is established with GI and had endoscopy and colonoscopy in 2021. Continue omeprazole 40 mg.        Endocrine   Type 2 diabetes mellitus without complication, without long-term current use of insulin ([place])    Patient currently maintained on glipizide 10 mg, linagliptin, metformin.  She is checking her blood glucose at least daily at home has had 1 incidence of lower blood glucose which she was able to fix with oral management.  Too early to check A1c in office.  We will have patient follow-up in a couple months to see how she is doing with weight and blood glucose levels.  Continue taking medications as described previous.        Other   Hyperlipidemia    Currently maintained on Repatha.  Continue medication as prescribed and follow-up with cardiology as recommended.      Localized edema    Patient currently on 2 different fluid medications still having pitting edema in office.  We will check labs inclusive of BNP pending lab result.  Patient has no trouble lying flat and sleeping lungs clear to auscultation in office.      Relevant  Orders   Brain natriuretic peptide   Leg cramping    Patient is Describes bilateral lower extremity cramping when she gets up in the morning along  with a numbness feeling.  States more noticeable over the past 2 weeks we will check labs encourage hydration.  If this continues secondary disease process could be investigated such as neuropathy or MS.  Patient made aware to monitor if no better will pursue neurology referral      Relevant Orders   Magnesium   Iron deficiency anemia    Currently maintained and followed by hematology/oncology.  States gets inject infusions every 2 weeks approved through February 2023.  Continue getting infusions as prescribed and following up with hematology/oncology as recommended.  Continue to monitor      Paresthesia    Patient is Describes bilateral lower extremity cramping when she gets up in the morning along with a numbness feeling.  States more noticeable over the past 2 weeks we will check labs encourage hydration.  If this continues secondary disease process could be investigated such as neuropathy or MS.  Patient made aware to monitor if no better will pursue neurology referral      Relevant Orders   Magnesium   Other Visit Diagnoses     Need for influenza vaccination       Relevant Orders   Flu Vaccine QUAD High Dose(Fluad) (Completed)       No orders of the defined types were placed in this encounter.   Follow-up: Return in about 2 months (around 11/05/2021) for Annual wellness and labs.   This visit occurred during the SARS-CoV-2 public health emergency.  Safety protocols were in place, including screening questions prior to the visit, additional usage of staff PPE, and extensive cleaning of exam room while observing appropriate contact time as indicated for disinfecting solutions.   Romilda Garret, NP

## 2021-09-05 NOTE — Assessment & Plan Note (Signed)
Blood pressure within normal limits in office today.  Patient states that she does have a wrist cuff at home but does not think is reliable working on getting upper arm cuff for more reliable at home blood pressure monitoring.  Patient currently maintained on spironolactone, losartan, isosorbide mononitrate, furosemide, carvedilol, amlodipine.  Patient is followed by Dr. [name] loss through cardiology.  Continue following up with her as scheduled and recommended continue medication as prescribed.

## 2021-09-07 ENCOUNTER — Other Ambulatory Visit: Payer: Self-pay | Admitting: Family Medicine

## 2021-09-07 DIAGNOSIS — E2839 Other primary ovarian failure: Secondary | ICD-10-CM

## 2021-09-08 ENCOUNTER — Other Ambulatory Visit: Payer: Medicare PPO

## 2021-09-13 ENCOUNTER — Encounter (HOSPITAL_COMMUNITY): Admission: RE | Admit: 2021-09-13 | Payer: Medicare PPO | Source: Ambulatory Visit

## 2021-09-13 ENCOUNTER — Inpatient Hospital Stay: Payer: Medicare PPO

## 2021-09-13 ENCOUNTER — Inpatient Hospital Stay: Payer: Medicare PPO | Attending: Physician Assistant | Admitting: Physician Assistant

## 2021-09-13 ENCOUNTER — Other Ambulatory Visit: Payer: Self-pay | Admitting: Physician Assistant

## 2021-09-13 ENCOUNTER — Other Ambulatory Visit: Payer: Self-pay

## 2021-09-13 VITALS — BP 170/86 | HR 71 | Temp 98.0°F | Resp 17 | Wt 310.6 lb

## 2021-09-13 DIAGNOSIS — Z800 Family history of malignant neoplasm of digestive organs: Secondary | ICD-10-CM | POA: Insufficient documentation

## 2021-09-13 DIAGNOSIS — N189 Chronic kidney disease, unspecified: Secondary | ICD-10-CM | POA: Insufficient documentation

## 2021-09-13 DIAGNOSIS — E1122 Type 2 diabetes mellitus with diabetic chronic kidney disease: Secondary | ICD-10-CM | POA: Insufficient documentation

## 2021-09-13 DIAGNOSIS — I1310 Hypertensive heart and chronic kidney disease without heart failure, with stage 1 through stage 4 chronic kidney disease, or unspecified chronic kidney disease: Secondary | ICD-10-CM | POA: Insufficient documentation

## 2021-09-13 DIAGNOSIS — D631 Anemia in chronic kidney disease: Secondary | ICD-10-CM | POA: Insufficient documentation

## 2021-09-13 DIAGNOSIS — D508 Other iron deficiency anemias: Secondary | ICD-10-CM | POA: Diagnosis not present

## 2021-09-13 DIAGNOSIS — D509 Iron deficiency anemia, unspecified: Secondary | ICD-10-CM | POA: Diagnosis not present

## 2021-09-13 LAB — RETIC PANEL
Immature Retic Fract: 11.8 % (ref 2.3–15.9)
RBC.: 4.86 MIL/uL (ref 3.87–5.11)
Retic Count, Absolute: 66.1 10*3/uL (ref 19.0–186.0)
Retic Ct Pct: 1.4 % (ref 0.4–3.1)
Reticulocyte Hemoglobin: 24.4 pg — ABNORMAL LOW (ref >27.9)

## 2021-09-13 LAB — CBC WITH DIFFERENTIAL (CANCER CENTER ONLY)
Abs Immature Granulocytes: 0.01 10*3/uL (ref 0.00–0.07)
Basophils Absolute: 0 10*3/uL (ref 0.0–0.1)
Basophils Relative: 1 %
Eosinophils Absolute: 0.2 10*3/uL (ref 0.0–0.5)
Eosinophils Relative: 3 %
HCT: 36.7 % (ref 36.0–46.0)
Hemoglobin: 10.9 g/dL — ABNORMAL LOW (ref 12.0–15.0)
Immature Granulocytes: 0 %
Lymphocytes Relative: 31 %
Lymphs Abs: 1.8 10*3/uL (ref 0.7–4.0)
MCH: 22 pg — ABNORMAL LOW (ref 26.0–34.0)
MCHC: 29.7 g/dL — ABNORMAL LOW (ref 30.0–36.0)
MCV: 74.1 fL — ABNORMAL LOW (ref 80.0–100.0)
Monocytes Absolute: 0.5 10*3/uL (ref 0.1–1.0)
Monocytes Relative: 8 %
Neutro Abs: 3.3 10*3/uL (ref 1.7–7.7)
Neutrophils Relative %: 57 %
Platelet Count: 252 10*3/uL (ref 150–400)
RBC: 4.95 MIL/uL (ref 3.87–5.11)
RDW: 16.4 % — ABNORMAL HIGH (ref 11.5–15.5)
WBC Count: 5.8 10*3/uL (ref 4.0–10.5)
nRBC: 0 % (ref 0.0–0.2)

## 2021-09-13 LAB — IRON AND TIBC
Iron: 47 ug/dL (ref 41–142)
Saturation Ratios: 19 % — ABNORMAL LOW (ref 21–57)
TIBC: 245 ug/dL (ref 236–444)
UIBC: 198 ug/dL (ref 120–384)

## 2021-09-13 LAB — VITAMIN B12: Vitamin B-12: 998 pg/mL — ABNORMAL HIGH (ref 180–914)

## 2021-09-13 LAB — FERRITIN: Ferritin: 168 ng/mL (ref 11–307)

## 2021-09-14 ENCOUNTER — Encounter (HOSPITAL_COMMUNITY): Payer: Self-pay

## 2021-09-14 ENCOUNTER — Encounter: Payer: Self-pay | Admitting: Physician Assistant

## 2021-09-14 NOTE — Progress Notes (Signed)
[place] Telephone:([phone]   Fax:([phone]) [place] PROGRESS NOTE  Patient Care Team: Michela Pitcher, NP as PCP - General (Pain Medicine) [name], MD as PCP - Cardiology (Cardiology) [name], MD as Consulting Physician (Cardiology)  Hematological/Oncological History #Microcytic Anemia: -07/18/2020: Colonoscopy: one 5 mm polyp in descending colon. Diverticulosis in sigmoid colon and ascending colon.   -11/22/2020: WBC 4.5, Hgb 10.1 (L), MCV 69.6, Plt 235  -11/30/2020: Vitamin B12 186 (L), Retic Ct Pct 1.9%, Abs Retic 87,780 (H), Iron 62, Transferrin 219, iron saturation 20.2% (L), Ferritin 70.4, Folate 12.3. Started vitamin B12 2000 mcg per day.   -01/23/2021:EGD/EUS: Previous surgical intervention-query mini-gastric pouch found in the cardia. Submucosal nodule in the duodenum bulb removed via EMR. Pathology was consistent with polypoid dudoenal tissue showing nodular, mature adipose tissue within the submucosa, consistent with a lipoma. Negative for dysplasia or malignancy.  -03/29/2021: WBC 5.6, Hgb 9.2 (L), MCV 68.6 (L), Plt 251, Ferritin 56.6  -04/04/2021: Establish care with Dede Query PA-C  04/28/2021-05/26/2021: Received IV venofer x 5 doses.    CHIEF COMPLAINTS:  "Microcytic anemia"  HISTORY OF PRESENTING ILLNESS:  Alicia [name] 75 y.o. female returns for a follow up for microcytic anemia.  She reports energy levels are fairly stable.  She is able to complete all her ADLs on her own.  She has a good appetite and denies any weight changes.  Patient denies any nausea, vomiting or abdominal pain.  She reports chronic loose stools and her stools are dark due to taking oral iron.  She reports stable lower extremity edema that improves with elevation.  She denies easy bruising or signs of bleeding. Patient denies any fevers, chills, night sweats, shortness of breath, chest pain or cough. She has no other complaints. Rest of the 10  point ROS is below.   MEDICAL HISTORY:  Past Medical History:  Diagnosis Date   Anemia    Arthritis    "right knee" (10/21/2015)   CAD (coronary artery disease)    a. Cath 10/21/15: s/p DES to RCA: moderate diffuse stenosis of mod diagonal, diffuse irregularity LCx and LAD. 2D echo 10/21/15: mod LVH, EF 60-65%, no RWMA, calcified MV, mod-severe LAE. 03/18/17 PCI with DES--> 1 diag   Cataract    Diabetes ([place])    Essential hypertension    Family history of adverse reaction to anesthesia    "oldest sister, [name], related to brain formation at back of head; when they put her to sleep it's hard to wake her up"   GERD (gastroesophageal reflux disease)    History of blood transfusion    "related to ruptured tubal pregnancy"   Hyperlipidemia    Hypertensive heart disease    Morbid obesity ([place])    Stroke (cerebrum) ([place])    Tubular adenoma of colon    Type II diabetes mellitus ([place])     SURGICAL HISTORY: Past Surgical History:  Procedure Laterality Date   ABDOMINAL HYSTERECTOMY  1988   fibroid   [place]   BIOPSY  07/18/2020   Procedure: BIOPSY;  Surgeon: [name], MD;  Location: [facility];  Service: Gastroenterology;;   CARDIAC CATHETERIZATION N/A 10/21/2015   Procedure: Right/Left Heart Cath and Coronary Angiography;  Surgeon: [name], MD;  Location: [facility];  Service: Cardiovascular;  Laterality: N/A;   CHOLECYSTECTOMY OPEN  1984   COLONOSCOPY WITH PROPOFOL N/A 07/18/2020   Procedure: COLONOSCOPY  WITH PROPOFOL;  Surgeon: [name], MD;  Location: [facility];  Service: Gastroenterology;  Laterality: N/A;   CORONARY STENT INTERVENTION N/A 03/18/2017   Procedure: Coronary Stent Intervention;  Surgeon: [name], MD;  Location: [place] [facility];  Service: Cardiovascular;  Laterality: N/A;   CORONARY STENT PLACEMENT  03/18/2017   A OPTIMIZE STUDY Drug Eluting Stent (2.5 mm 18 mm - post-dilated to 2.7 mm) was  successfully placed   ECTOPIC PREGNANCY SURGERY     ENDOSCOPIC MUCOSAL RESECTION N/A 01/23/2021   Procedure: ENDOSCOPIC MUCOSAL RESECTION;  Surgeon: Irving Copas., MD;  Location: [facility];  Service: Gastroenterology;  Laterality: N/A;   ESOPHAGOGASTRODUODENOSCOPY (EGD) WITH PROPOFOL N/A 07/18/2020   Procedure: ESOPHAGOGASTRODUODENOSCOPY (EGD) WITH PROPOFOL;  Surgeon: [name], MD;  Location: [facility];  Service: Gastroenterology;  Laterality: N/A;   ESOPHAGOGASTRODUODENOSCOPY (EGD) WITH PROPOFOL N/A 01/23/2021   Procedure: ESOPHAGOGASTRODUODENOSCOPY (EGD) WITH PROPOFOL;  Surgeon: Rush Landmark Telford Nab., MD;  Location: [facility];  Service: Gastroenterology;  Laterality: N/A;   EUS N/A 01/23/2021   Procedure: UPPER ENDOSCOPIC ULTRASOUND (EUS) RADIAL;  Surgeon: Irving Copas., MD;  Location: [facility];  Service: Gastroenterology;  Laterality: N/A;   HEMOSTASIS CLIP PLACEMENT  01/23/2021   Procedure: HEMOSTASIS CLIP PLACEMENT;  Surgeon: Irving Copas., MD;  Location: [facility];  Service: Gastroenterology;;   INTRAVASCULAR PRESSURE WIRE/FFR STUDY N/A 09/04/2019   Procedure: INTRAVASCULAR PRESSURE WIRE/FFR STUDY;  Surgeon: Nelva Bush, MD;  Location: [place];  Service: Cardiovascular;  Laterality: N/A;   LEFT HEART CATH AND CORONARY ANGIOGRAPHY N/A 03/18/2017   Procedure: Left Heart Cath and Coronary Angiography;  Surgeon: [name], MD;  Location: [facility] [place] [facility];  Service: Cardiovascular;  Laterality: N/A;   LEFT HEART CATH AND CORONARY ANGIOGRAPHY N/A 09/04/2019   Procedure: LEFT HEART CATH AND CORONARY ANGIOGRAPHY;  Surgeon: Nelva Bush, MD;  Location: [place] [facility];  Service: Cardiovascular;  Laterality: N/A;   POLYPECTOMY  07/18/2020   Procedure: POLYPECTOMY;  Surgeon: [name], MD;  Location: [facility];  Service: Gastroenterology;;   REDUCTION MAMMAPLASTY Bilateral ~ [place] INJECTION  01/23/2021    Procedure: SUBMUCOSAL LIFTING INJECTION;  Surgeon: Irving Copas., MD;  Location: [facility];  Service: Gastroenterology;;   TONSILLECTOMY      SOCIAL HISTORY: Social History   Socioeconomic History   Marital status: Divorced    Spouse name: Not on file   Number of children: 3   Years of education: doctorate   Highest education level: Not on file  Occupational History   Occupation: Teacher/retired  Tobacco Use   Smoking status: Never   Smokeless tobacco: Never  Vaping Use   Vaping Use: Never used  Substance and Sexual Activity   Alcohol use: No   Drug use: No   Sexual activity: Not Currently  Other Topics Concern   Not on file  Social History Narrative   Caffeine use-yes   Regular exercise-no   Social Determinants of Health   Financial Resource Strain: Not on file  Food Insecurity: Not on file  Transportation Needs: Not on file  Physical Activity: Not on file  Stress: Not on file  Social Connections: Not on file  Intimate Partner Violence: Not on file    FAMILY HISTORY: Family History  Problem Relation Age of Onset   Diabetes Mother    Heart attack Mother 76       2007   Hypertension Mother    Hyperlipidemia  Mother    Heart disease Mother    Heart attack Father    Stroke Father 38   Hypertension Father    Hyperlipidemia Father    Cancer Sister        Gastric cancer in 2013   Stomach cancer Sister        in the cavity behind the stomach   Diabetes Sister    Diabetes Sister    Diabetes Sister    Migraines Daughter        brain surgery   Migraines Daughter    Hypertension Son        weight loss corrected that   Breast cancer Neg Hx    Colon cancer Neg Hx    Inflammatory bowel disease Neg Hx    Liver disease Neg Hx    Esophageal cancer Neg Hx    Pancreatic cancer Neg Hx    Rectal cancer Neg Hx     ALLERGIES:  is allergic to iodinated diagnostic agents and sulfa antibiotics.  MEDICATIONS:  Current Outpatient Medications   Medication Sig Dispense Refill   amLODipine (NORVASC) 10 MG tablet TAKE 1 TABLET BY MOUTH EVERY DAY 90 tablet 3   aspirin EC 81 MG tablet Take 1 tablet (81 mg total) by mouth daily. Swallow whole. 30 tablet 11   atorvastatin (LIPITOR) 80 MG tablet TAKE 1 TABLET BY MOUTH DAILY AT 6 PM. (Patient taking differently: Take 80 mg by mouth at bedtime.) 90 tablet 3   carvedilol (COREG) 25 MG tablet TAKE 2 TABLETS (50 MG TOTAL) BY MOUTH 2 (TWO) TIMES DAILY WITH A MEAL. 360 tablet 3   Cholecalciferol 50 MCG (2000 UT) TABS Take one tablet daily alternating with two tablets daily 30 tablet    clopidogrel (PLAVIX) 75 MG tablet Take 1 tablet (75 mg total) by mouth daily. 90 tablet 3   doxylamine, Sleep, (UNISOM) 25 MG tablet Take 25 mg by mouth at bedtime.     Evolocumab (REPATHA SURECLICK) 982 MG/ML SOAJ Inject 1 pen into the skin every 14 (fourteen) days. (Patient taking differently: Inject 140 mg into the skin every 14 (fourteen) days. Thursday) 2 mL 11   ferrous sulfate 325 (65 FE) MG tablet TAKE 1 TABLET BY MOUTH EVERY DAY WITH BREAKFAST 90 tablet 0   furosemide (LASIX) 20 MG tablet TAKE 1 TABLET BY MOUTH EVERY DAY 90 tablet 2   glipiZIDE (GLUCOTROL) 10 MG tablet Take 1 tablet (10 mg total) by mouth 2 (two) times daily before a meal. 180 tablet 1   glucose blood (ONETOUCH ULTRA) test strip 1 each by Other route 2 (two) times daily. LAST REFILL SCHEDULE PHYSICAL 200 strip 0   isosorbide mononitrate (IMDUR) 60 MG 24 hr tablet TAKE 1 TABLET BY MOUTH EVERY DAY 90 tablet 2   linagliptin (TRADJENTA) 5 MG TABS tablet TAKE 1 TABLET (5 MG TOTAL) BY MOUTH DAILY. 90 tablet 1   losartan (COZAAR) 100 MG tablet Take 1 tablet (100 mg total) by mouth daily. 90 tablet 1   losartan (COZAAR) 50 MG tablet Take 1 tablet (50 mg total) by mouth daily. 90 tablet 3   Magnesium 200 MG TABS Take 200 mg by mouth daily.     metFORMIN (GLUCOPHAGE) 1000 MG tablet Take 1 tablet (1,000 mg total) by mouth 2 (two) times daily with a meal.  180 tablet 1   nitroGLYCERIN (NITROSTAT) 0.4 MG SL tablet Place 1 tablet (0.4 mg total) under the tongue every 5 (five) minutes as needed for chest pain.  25 tablet 3   omeprazole (PRILOSEC) 40 MG capsule TAKE 1 CAPSULE BY MOUTH EVERY DAY 90 capsule 1   ondansetron (ZOFRAN-ODT) 4 MG disintegrating tablet TAKE 1 TABLET BY MOUTH EVERY 8 HOURS AS NEEDED FOR NAUSEA AND VOMITING 30 tablet 0   spironolactone (ALDACTONE) 25 MG tablet TAKE 1 TABLET BY MOUTH EVERY DAY 90 tablet 2   vitamin B-12 (CYANOCOBALAMIN) 500 MCG tablet Take 2 tablets (1,000 mcg total) by mouth 2 (two) times daily. 120 tablet 2   Vitamin D, Ergocalciferol, (DRISDOL) 1.25 MG (50000 UNIT) CAPS capsule Take 1 capsule (50,000 Units total) by mouth every 7 (seven) days. Please return for vit d level check on 02/10/21. 8 capsule 0   No current facility-administered medications for this visit.    REVIEW OF SYSTEMS:   Constitutional: ( - ) fevers, ( - )  chills , ( - ) night sweats Eyes: ( - ) blurriness of vision, ( - ) double vision, ( - ) watery eyes Ears, nose, mouth, throat, and face: ( - ) mucositis, ( - ) sore throat Respiratory: ( - ) cough, ( - ) dyspnea, ( - ) wheezes Cardiovascular: ( - ) palpitation, ( - ) chest discomfort, ( + ) lower extremity swelling Gastrointestinal:  ( - ) nausea, ( - ) heartburn, ( - ) change in bowel habits Skin: ( - ) abnormal skin rashes Lymphatics: ( - ) new lymphadenopathy, ( - ) easy bruising Neurological: ( - ) numbness, ( - ) tingling, ( - ) new weaknesses Behavioral/Psych: ( - ) mood change, ( - ) new changes  All other systems were reviewed with the patient and are negative.  PHYSICAL EXAMINATION: ECOG PERFORMANCE STATUS: 1 - Symptomatic but completely ambulatory  Vitals:   09/13/21 1035  BP: (!) 170/86  Pulse: 71  Resp: 17  Temp: 98 F (36.7 C)  SpO2: 100%   Filed Weights   09/13/21 1035  Weight: (!) 310 lb 9.6 oz (140.9 kg)    GENERAL: well appearing African American female  in NAD, obese SKIN: skin color, texture, turgor are normal, no rashes or significant lesions EYES: conjunctiva are pink and non-injected, sclera clear OROPHARYNX: no exudate, no erythema; lips, buccal mucosa, and tongue normal  NECK: supple, non-tender LYMPH:  no palpable lymphadenopathy in the cervical or supraclavicular lymph nodes.  LUNGS: clear to auscultation and percussion with normal breathing effort HEART: regular rate & rhythm and no murmurs and no lower extremity edema ABDOMEN: soft, non-tender, non-distended, normal bowel sounds Musculoskeletal: no cyanosis of digits and no clubbing  PSYCH: alert & oriented x 3, fluent speech NEURO: no focal motor/sensory deficits  LABORATORY DATA:  I have reviewed the data as listed CBC Latest Ref Rng & Units 09/13/2021 09/05/2021 08/30/2021  WBC 4.0 - 10.5 K/uL 5.8 4.6 -  Hemoglobin 12.0 - 15.0 g/dL 10.9(L) 11.2(L) 11.3(L)  Hematocrit 36.0 - 46.0 % 36.7 35.9(L) -  Platelets 150 - 400 K/uL 252 260.0 -    CMP Latest Ref Rng & Units 09/05/2021 08/30/2021 08/30/2021  Glucose 70 - 99 mg/dL 84 95 -  BUN 6 - 23 mg/dL 27(H) 24(H) -  Creatinine 0.40 - 1.20 mg/dL 2.02(H) 1.87(H) -  Sodium 135 - 145 mEq/L 138 139 -  Potassium 3.5 - 5.1 mEq/L 4.8 4.3 -  Chloride 96 - 112 mEq/L 102 106 -  CO2 19 - 32 mEq/L 27 23 -  Calcium 8.4 - 10.5 mg/dL 8.8 8.7(L) 8.6(L)  Total Protein 6.0 - 8.3  g/dL 6.4 - -  Total Bilirubin 0.2 - 1.2 mg/dL 0.5 - -  Alkaline Phos 39 - 117 U/L 101 - -  AST 0 - 37 U/L 12 - -  ALT 0 - 35 U/L 8 - -   ASSESSMENT & PLAN Alicia [name] is a 75 y.o. female presenting to the clinic for follow up of microcytic anemia.   #Microcytic Anemia: --Multifactorial in the setting of CKD, malabsorption of iron and B12 due to history of gastric bypass.  --Currently receiving Retacrit 20,000 Units every 2 weeks, last dose on 08/30/2021.  --Patient denies any signs of bleeding. She underwent colonoscopy in August 2021 and EGD in March 2022  without any evidence of active or recent bleeding.  --Patient received IV venofer once weekly x 5 doses from 04/28/2021-05/26/2021. --Labs today shows anemia is fairly stable with hgb of 10.9, MCV 74.1. Serum iron 47, iron saturation 19%, Ferritin 168, vitamin B12 998. --Currently on ferrous sulfate 325 mg daily and vitamin B12 1000 mcg twice daily. Recommend to continue.  --No indication for bone marrow biopsy at this time.  --RTC  in 3 months with labs.   Orders Placed This Encounter  Procedures   CBC with Differential ([place] Only)    Standing Status:   Future    Standing Expiration Date:   09/14/2022   Ferritin    Standing Status:   Future    Standing Expiration Date:   09/14/2022   Iron and TIBC    Standing Status:   Future    Standing Expiration Date:   09/14/2022   Retic Panel    Standing Status:   Future    Standing Expiration Date:   09/14/2022   Vitamin B12    Standing Status:   Future    Standing Expiration Date:   09/14/2022      All questions were answered. The patient knows to call the clinic with any problems, questions or concerns.  I have spent a total of 25 minutes minutes of face-to-face and non-face-to-face time, preparing to see the patient, performing a medically appropriate examination, counseling and educating the patient, ordering tests, documenting clinical information in the electronic health record, and care coordination.   Dede Query, PA-C Department of Hematology/Oncology [place] at [facility] Phone: [phone]

## 2021-09-15 ENCOUNTER — Telehealth: Payer: Self-pay | Admitting: Hematology and Oncology

## 2021-09-15 NOTE — Telephone Encounter (Signed)
Scheduled per 10/26 los, attempted to call pt, was unable to leave message, will mail calender

## 2021-09-21 LAB — METHYLMALONIC ACID, SERUM: Methylmalonic Acid, Quantitative: 309 nmol/L (ref 0–378)

## 2021-09-26 DIAGNOSIS — H524 Presbyopia: Secondary | ICD-10-CM | POA: Diagnosis not present

## 2021-09-26 DIAGNOSIS — H04123 Dry eye syndrome of bilateral lacrimal glands: Secondary | ICD-10-CM | POA: Diagnosis not present

## 2021-09-26 DIAGNOSIS — H35033 Hypertensive retinopathy, bilateral: Secondary | ICD-10-CM | POA: Diagnosis not present

## 2021-09-26 DIAGNOSIS — H25813 Combined forms of age-related cataract, bilateral: Secondary | ICD-10-CM | POA: Diagnosis not present

## 2021-09-26 DIAGNOSIS — H52223 Regular astigmatism, bilateral: Secondary | ICD-10-CM | POA: Diagnosis not present

## 2021-09-26 DIAGNOSIS — H5213 Myopia, bilateral: Secondary | ICD-10-CM | POA: Diagnosis not present

## 2021-09-26 DIAGNOSIS — H0102A Squamous blepharitis right eye, upper and lower eyelids: Secondary | ICD-10-CM | POA: Diagnosis not present

## 2021-09-26 DIAGNOSIS — E119 Type 2 diabetes mellitus without complications: Secondary | ICD-10-CM | POA: Diagnosis not present

## 2021-09-26 DIAGNOSIS — H0102B Squamous blepharitis left eye, upper and lower eyelids: Secondary | ICD-10-CM | POA: Diagnosis not present

## 2021-09-26 LAB — HM DIABETES EYE EXAM

## 2021-09-27 ENCOUNTER — Encounter (HOSPITAL_COMMUNITY): Payer: Medicare PPO

## 2021-09-27 ENCOUNTER — Encounter: Payer: Self-pay | Admitting: Nurse Practitioner

## 2021-10-08 ENCOUNTER — Other Ambulatory Visit: Payer: Self-pay | Admitting: Internal Medicine

## 2021-10-08 NOTE — Telephone Encounter (Signed)
Pt of [facility]-

## 2021-10-10 NOTE — Telephone Encounter (Signed)
I spoke with Lafaye at [facility]. And ferrous sulfate 325 mg # 90 take one daily was picked up from pharmacy on 09/01/21. Pt said she only has one wk of med in bottle and pt only takes one pill a day of ferrous sulfate. Pt checked date on bottle of ferrous sulfate and the pt said she has a lot more than one wk of ferrous sulfate; pt has 1/2 bottle at least of ferrous sulfate so nothing further needed at this time.

## 2021-10-29 ENCOUNTER — Other Ambulatory Visit: Payer: Self-pay | Admitting: Internal Medicine

## 2021-10-29 DIAGNOSIS — E7800 Pure hypercholesterolemia, unspecified: Secondary | ICD-10-CM

## 2021-10-29 DIAGNOSIS — I2510 Atherosclerotic heart disease of native coronary artery without angina pectoris: Secondary | ICD-10-CM

## 2021-10-29 NOTE — Telephone Encounter (Signed)
Pt of Karl Ito NP at [facility]. [facility] does not do prescriptions for this practice.

## 2021-10-30 NOTE — Telephone Encounter (Signed)
See 10/08/21 refill note. Sending to [facility]. CMA

## 2021-10-30 NOTE — Telephone Encounter (Signed)
Per notes patient sees hematology/oncology-should this refill request go to them?

## 2021-11-01 NOTE — Telephone Encounter (Signed)
Have filled a 75mo supply.

## 2021-11-06 ENCOUNTER — Encounter: Payer: Self-pay | Admitting: Nurse Practitioner

## 2021-11-06 ENCOUNTER — Ambulatory Visit (INDEPENDENT_AMBULATORY_CARE_PROVIDER_SITE_OTHER): Payer: Medicare PPO | Admitting: Nurse Practitioner

## 2021-11-06 ENCOUNTER — Other Ambulatory Visit: Payer: Self-pay

## 2021-11-06 VITALS — BP 124/62 | HR 78 | Temp 97.9°F | Ht 64.0 in | Wt 303.0 lb

## 2021-11-06 DIAGNOSIS — N184 Chronic kidney disease, stage 4 (severe): Secondary | ICD-10-CM | POA: Insufficient documentation

## 2021-11-06 DIAGNOSIS — I6300 Cerebral infarction due to thrombosis of unspecified precerebral artery: Secondary | ICD-10-CM | POA: Diagnosis not present

## 2021-11-06 DIAGNOSIS — E785 Hyperlipidemia, unspecified: Secondary | ICD-10-CM

## 2021-11-06 DIAGNOSIS — Z0000 Encounter for general adult medical examination without abnormal findings: Secondary | ICD-10-CM | POA: Insufficient documentation

## 2021-11-06 DIAGNOSIS — R600 Localized edema: Secondary | ICD-10-CM

## 2021-11-06 DIAGNOSIS — R5383 Other fatigue: Secondary | ICD-10-CM | POA: Diagnosis not present

## 2021-11-06 DIAGNOSIS — E119 Type 2 diabetes mellitus without complications: Secondary | ICD-10-CM

## 2021-11-06 DIAGNOSIS — I10 Essential (primary) hypertension: Secondary | ICD-10-CM | POA: Diagnosis not present

## 2021-11-06 DIAGNOSIS — Z760 Encounter for issue of repeat prescription: Secondary | ICD-10-CM | POA: Diagnosis not present

## 2021-11-06 DIAGNOSIS — Z1231 Encounter for screening mammogram for malignant neoplasm of breast: Secondary | ICD-10-CM

## 2021-11-06 LAB — POCT GLYCOSYLATED HEMOGLOBIN (HGB A1C): Hemoglobin A1C: 7 % — AB (ref 4.0–5.6)

## 2021-11-06 MED ORDER — ONDANSETRON 4 MG PO TBDP: ORAL_TABLET | ORAL | 0 refills | Status: AC

## 2021-11-06 NOTE — Assessment & Plan Note (Signed)
Currently maintained on aspirin and clopidogrel.  Patient was evaluated by neurology and was released.  Patient report right-sided subjective weakness no weakness appreciated on exam today.  Continue medication as prescribed

## 2021-11-06 NOTE — Assessment & Plan Note (Signed)
Currently maintained on Repatha to atorvastatin.  Continue medications pending lipid panel today

## 2021-11-06 NOTE — Assessment & Plan Note (Signed)
Patient currently followed by nephrology.  Check renal function today as patient has had episodes of nausea vomiting the week and absent resolved.  Per report has not put out much urine output pending lab results

## 2021-11-06 NOTE — Assessment & Plan Note (Signed)
Reviewed paperwork with patient we will scanned to medical record.  Reviewed appropriate immunizations and health screening exams with patient.  Continue working on lifestyle modifications

## 2021-11-06 NOTE — Patient Instructions (Signed)
Nice to see you today Want to see you in about 3.5 months to recheck your sugar level. Continue taking your medications as prescribed. Your A1C is a little elevated today but we will not change your medicines today. Just continue to watch what you are eating and work on your walking

## 2021-11-06 NOTE — Assessment & Plan Note (Signed)
Currently maintained on diuretics inclusive of spironolactone and furosemide.  Continue taking medication as prescribed follow-up with cardiology as recommended

## 2021-11-06 NOTE — Progress Notes (Signed)
Established Patient Office Visit  Subjective:  Patient ID: Alicia [name], female    DOB: 21-Aug-1946  Age: 75 y.o. MRN: [id number]  CC:  Chief Complaint  Patient presents with   Annual Exam    AWV    Medication Refill    Needs refill on all medications     HPI [name] presents for complete physical and follow up of chronic conditions.  Immunizations: -Tetanus: UTD 10/09/2012 -Influenza: 09/05/2021 -Covid-19: Pfizer x2 and a booster -Shingles: -Pneumonia:   -HPV:  Diet: Fair diet.  Two meals a day. With some snacking.  Water coffee (cream and splenda) and tea Exercise: No regular exercise. Walks twice a week at 15 minutes each  Eye exam: Completes annually this year Dental exam:   Pap Smear: Status post hysterectomy Mammogram: Completed in 02/08/2020 Dexa: Completed in ? Ordered and scheduled for march 2023 Colonoscopy: 07/18/2020 Dr Zenovia Jarred  Lung Cancer Screening: NA  DM2: checks sugar twice daily high level recently of 399. Today was 189. Normally in the lower 90s 100s when she checks  HTN Does not check BP at home.  The paper work was reviewed with patient at bedside and sent to be scanned into medical records.  PHQ9 SCORE ONLY 11/06/2021 07/04/2021 03/28/2020  PHQ-9 Total Score 1 5 0    Fall Risk 05/05/2021 05/12/2021 05/19/2021 07/04/2021 11/06/2021  Falls in the past year? - - - 0 1  Was there an injury with Fall? - - - - 1  Fall Risk Category Calculator - - - - 2  Fall Risk Category - - - - Moderate  Patient Fall Risk Level Low fall risk Low fall risk Low fall risk Low fall risk High fall risk  Patient at Risk for Falls Due to - - - - Impaired balance/gait  Fall risk Follow up - - - - -    Hearing Screening  Method: Audiometry   500Hz  1000Hz  2000Hz  3000Hz  4000Hz  5000Hz   Right ear Pass Pass Pass Pass Pass Pass  Left ear  Pass Pass Pass Pass Pass  Vision Screening - Comments:: Eye exam in chart done on 09/26/21   Past Medical History:  Diagnosis  Date   Anemia    Arthritis    "right knee" (10/21/2015)   CAD (coronary artery disease)    a. Cath 10/21/15: s/p DES to RCA: moderate diffuse stenosis of mod diagonal, diffuse irregularity LCx and LAD. 2D echo 10/21/15: mod LVH, EF 60-65%, no RWMA, calcified MV, mod-severe LAE. 03/18/17 PCI with DES--> 1 diag   Cataract    Diabetes ([place])    Essential hypertension    Family history of adverse reaction to anesthesia    "oldest sister, [name], related to brain formation at back of head; when they put her to sleep it's hard to wake her up"   GERD (gastroesophageal reflux disease)    History of blood transfusion    "related to ruptured tubal pregnancy"   Hyperlipidemia    Hypertensive heart disease    Morbid obesity ([place])    Stroke (cerebrum) ([place])    Tubular adenoma of colon    Type II diabetes mellitus ([place])     Past Surgical History:  Procedure Laterality Date   [place]   BIOPSY  07/18/2020   Procedure: BIOPSY;  Surgeon: [name], MD;  Location: [facility];  Service: Gastroenterology;;   CARDIAC CATHETERIZATION N/A  10/21/2015   Procedure: Right/Left Heart Cath and Coronary Angiography;  Surgeon: [name], MD;  Location: [facility];  Service: Cardiovascular;  Laterality: N/A;   CHOLECYSTECTOMY OPEN  1984   COLONOSCOPY WITH PROPOFOL N/A 07/18/2020   Procedure: COLONOSCOPY WITH PROPOFOL;  Surgeon: [name], MD;  [facility]: [facility];  Service: Gastroenterology;  Laterality: N/A;   CORONARY STENT INTERVENTION N/A 03/18/2017   Procedure: Coronary Stent Intervention;  Surgeon: [name], MD;  Location: [place] [facility];  Service: Cardiovascular;  Laterality: N/A;   CORONARY STENT PLACEMENT  03/18/2017   A OPTIMIZE STUDY Drug Eluting Stent (2.5 mm 18 mm - post-dilated to 2.7 mm) was successfully placed   ECTOPIC PREGNANCY SURGERY     ENDOSCOPIC MUCOSAL RESECTION N/A 01/23/2021    Procedure: ENDOSCOPIC MUCOSAL RESECTION;  Surgeon: Irving Copas., MD;  Location: [facility];  Service: Gastroenterology;  Laterality: N/A;   ESOPHAGOGASTRODUODENOSCOPY (EGD) WITH PROPOFOL N/A 07/18/2020   Procedure: ESOPHAGOGASTRODUODENOSCOPY (EGD) WITH PROPOFOL;  Surgeon: [name], MD;  [facility];  Service: Gastroenterology;  Laterality: N/A;   ESOPHAGOGASTRODUODENOSCOPY (EGD) WITH PROPOFOL N/A 01/23/2021   Procedure: ESOPHAGOGASTRODUODENOSCOPY (EGD) WITH PROPOFOL;  Surgeon: Rush Landmark Telford Nab., MD;  [facility]: [facility];  Service: Gastroenterology;  Laterality: N/A;   EUS N/A 01/23/2021   Procedure: UPPER ENDOSCOPIC ULTRASOUND (EUS) RADIAL;  Surgeon: Irving Copas., MD;  [facility];  Service: Gastroenterology;  Laterality: N/A;   HEMOSTASIS CLIP PLACEMENT  01/23/2021   Procedure: HEMOSTASIS CLIP PLACEMENT;  Surgeon: Irving Copas., MD;  Location: [facility];  Service: Gastroenterology;;   INTRAVASCULAR PRESSURE WIRE/FFR STUDY N/A 09/04/2019   Procedure: INTRAVASCULAR PRESSURE WIRE/FFR STUDY;  Surgeon: Nelva Bush, MD;  Location: [facility];  Service: Cardiovascular;  Laterality: N/A;   LEFT HEART CATH AND CORONARY ANGIOGRAPHY N/A 03/18/2017   Procedure: Left Heart Cath and Coronary Angiography;  Surgeon: [name], MD;  Location: [facility];  Service: Cardiovascular;  Laterality: N/A;   LEFT HEART CATH AND CORONARY ANGIOGRAPHY N/A 09/04/2019   Procedure: LEFT HEART CATH AND CORONARY ANGIOGRAPHY;  Surgeon: Nelva Bush, MD;  Location: [facility];  Service: Cardiovascular;  Laterality: N/A;   POLYPECTOMY  07/18/2020   Procedure: POLYPECTOMY;  Surgeon: [name], MD;  [facility]: [facility];  Service: Gastroenterology;;   REDUCTION MAMMAPLASTY Bilateral ~ [place] INJECTION  01/23/2021   Procedure: SUBMUCOSAL LIFTING INJECTION;  Surgeon: Irving Copas., MD;  Location: [facility];  Service: Gastroenterology;;   TONSILLECTOMY      Family History  Problem Relation Age of Onset   Diabetes Mother    Heart attack Mother 76       2007   Hypertension Mother    Hyperlipidemia Mother    Heart disease Mother    Heart attack Father    Stroke Father 24   Hypertension Father    Hyperlipidemia Father    Cancer Sister        Gastric cancer in 2013   Stomach cancer Sister        in the cavity behind the stomach   Diabetes Sister    Diabetes Sister    Diabetes Sister    Migraines Daughter        brain surgery   Migraines Daughter    Hypertension Son        weight loss corrected that   Breast cancer Neg Hx    Colon cancer Neg Hx  Inflammatory bowel disease Neg Hx    Liver disease Neg Hx    Esophageal cancer Neg Hx    Pancreatic cancer Neg Hx    Rectal cancer Neg Hx     Social History   Socioeconomic History   Marital status: Divorced    Spouse name: Not on file   Number of children: 3   Years of education: doctorate   Highest education level: Not on file  Occupational History   Occupation: Teacher/retired  Tobacco Use   Smoking status: Never   Smokeless tobacco: Never  Vaping Use   Vaping Use: Never used  Substance and Sexual Activity   Alcohol use: No   Drug use: No   Sexual activity: Not Currently  Other Topics Concern   Not on file  Social History Narrative   Caffeine use-yes   Regular exercise-no   Social Determinants of Health   Financial Resource Strain: Not on file  Food Insecurity: Not on file  Transportation Needs: Not on file  Physical Activity: Not on file  Stress: Not on file  Social Connections: Not on file  Intimate Partner Violence: Not on file    Outpatient Medications Prior to Visit  Medication Sig Dispense Refill   amLODipine (NORVASC) 10 MG tablet TAKE 1 TABLET BY MOUTH EVERY DAY 90 tablet 3   aspirin EC 81 MG tablet Take 1 tablet (81 mg total) by mouth daily. Swallow whole. 30 tablet 11   atorvastatin  (LIPITOR) 80 MG tablet TAKE 1 TABLET BY MOUTH DAILY AT 6 PM. (Patient taking differently: Take 80 mg by mouth at bedtime.) 90 tablet 3   carvedilol (COREG) 25 MG tablet TAKE 2 TABLETS (50 MG TOTAL) BY MOUTH 2 (TWO) TIMES DAILY WITH A MEAL. 360 tablet 3   Cholecalciferol 50 MCG (2000 UT) TABS Take one tablet daily alternating with two tablets daily 30 tablet    clopidogrel (PLAVIX) 75 MG tablet Take 1 tablet (75 mg total) by mouth daily. 90 tablet 3   doxylamine, Sleep, (UNISOM) 25 MG tablet Take 25 mg by mouth at bedtime.     Evolocumab (REPATHA SURECLICK) 389 MG/ML SOAJ INJECT 1 PEN INTO THE SKIN EVERY 14 (FOURTEEN) DAYS. 6 mL 0   ferrous sulfate 325 (65 FE) MG tablet TAKE 1 TABLET BY MOUTH EVERY DAY WITH BREAKFAST 90 tablet 0   furosemide (LASIX) 20 MG tablet TAKE 1 TABLET BY MOUTH EVERY DAY 90 tablet 2   glipiZIDE (GLUCOTROL) 10 MG tablet Take 1 tablet (10 mg total) by mouth 2 (two) times daily before a meal. 180 tablet 1   glucose blood (ONETOUCH ULTRA) test strip 1 each by Other route 2 (two) times daily. LAST REFILL SCHEDULE PHYSICAL 200 strip 0   isosorbide mononitrate (IMDUR) 60 MG 24 hr tablet TAKE 1 TABLET BY MOUTH EVERY DAY 90 tablet 2   linagliptin (TRADJENTA) 5 MG TABS tablet TAKE 1 TABLET (5 MG TOTAL) BY MOUTH DAILY. 90 tablet 1   losartan (COZAAR) 100 MG tablet Take 1 tablet (100 mg total) by mouth daily. 90 tablet 1   losartan (COZAAR) 50 MG tablet Take 1 tablet (50 mg total) by mouth daily. 90 tablet 3   Magnesium 200 MG TABS Take 200 mg by mouth daily.     metFORMIN (GLUCOPHAGE) 1000 MG tablet Take 1 tablet (1,000 mg total) by mouth 2 (two) times daily with a meal. 180 tablet 1   nitroGLYCERIN (NITROSTAT) 0.4 MG SL tablet Place 1 tablet (0.4 mg total) under the  tongue every 5 (five) minutes as needed for chest pain. 25 tablet 3   omeprazole (PRILOSEC) 40 MG capsule TAKE 1 CAPSULE BY MOUTH EVERY DAY 90 capsule 1   ondansetron (ZOFRAN-ODT) 4 MG disintegrating tablet TAKE 1 TABLET BY  MOUTH EVERY 8 HOURS AS NEEDED FOR NAUSEA AND VOMITING 30 tablet 0   spironolactone (ALDACTONE) 25 MG tablet TAKE 1 TABLET BY MOUTH EVERY DAY 90 tablet 2   vitamin B-12 (CYANOCOBALAMIN) 500 MCG tablet Take 2 tablets (1,000 mcg total) by mouth 2 (two) times daily. 120 tablet 2   Vitamin D, Ergocalciferol, (DRISDOL) 1.25 MG (50000 UNIT) CAPS capsule Take 1 capsule (50,000 Units total) by mouth every 7 (seven) days. Please return for vit d level check on 02/10/21. 8 capsule 0   No facility-administered medications prior to visit.    Allergies  Allergen Reactions   Iodinated Diagnostic Agents Hives   Sulfa Antibiotics Hives    ROS Review of Systems  Constitutional:  Positive for fatigue. Negative for chills and fever.  Respiratory:  Negative for shortness of breath.   Cardiovascular:  Positive for leg swelling. Negative for chest pain and palpitations.  Gastrointestinal:  Negative for blood in stool, constipation, diarrhea, nausea and vomiting.  Genitourinary:  Negative for hematuria, vaginal bleeding, vaginal discharge and vaginal pain.  Neurological:  Negative for weakness (right side worse than left), numbness and headaches.  Psychiatric/Behavioral:  Negative for hallucinations and suicidal ideas.      Objective:    Physical Exam Vitals and nursing note reviewed.  Constitutional:      Appearance: She is obese.  HENT:     Right Ear: Ear canal and external ear normal. There is no impacted cerumen.     Left Ear: Ear canal and external ear normal. There is impacted cerumen.     Mouth/Throat:     Mouth: Mucous membranes are moist.     Pharynx: Oropharynx is clear.  Eyes:     Extraocular Movements: Extraocular movements intact.     Pupils: Pupils are equal, round, and reactive to light.  Cardiovascular:     Rate and Rhythm: Normal rate and regular rhythm.     Pulses: Normal pulses.     Heart sounds: No murmur heard. Pulmonary:     Effort: Pulmonary effort is normal.     Breath  sounds: Normal breath sounds.  Abdominal:     General: Bowel sounds are normal.  Musculoskeletal:     Right lower leg: Edema present.     Left lower leg: Edema present.  Skin:    General: Skin is warm.  Neurological:     General: No focal deficit present.     Mental Status: She is alert. Mental status is at baseline.     Motor: No weakness.  Psychiatric:        Mood and Affect: Mood normal.        Behavior: Behavior normal.        Thought Content: Thought content normal.        Judgment: Judgment normal.    BP 124/62    Pulse 78    Temp 97.9 F (36.6 C) (Temporal)    Ht 5\' 4"  (1.626 m)    Wt (!) 303 lb (137.4 kg)    SpO2 97%    BMI 52.01 kg/m  Wt Readings from Last 3 Encounters:  11/06/21 (!) 303 lb (137.4 kg)  09/13/21 (!) 310 lb 9.6 oz (140.9 kg)  09/05/21 (!) 310 lb (140.6 kg)  Health Maintenance Due  Topic Date Due   Zoster Vaccines- Shingrix (1 of 2) Never done   DEXA SCAN  Never done    There are no preventive care reminders to display for this patient.  Lab Results  Component Value Date   TSH 2.040 12/12/2020   Lab Results  Component Value Date   WBC 5.8 09/13/2021   HGB 10.9 (L) 09/13/2021   HCT 36.7 09/13/2021   MCV 74.1 (L) 09/13/2021   PLT 252 09/13/2021   Lab Results  Component Value Date   NA 138 09/05/2021   K 4.8 09/05/2021   CO2 27 09/05/2021   GLUCOSE 84 09/05/2021   BUN 27 (H) 09/05/2021   CREATININE 2.02 (H) 09/05/2021   BILITOT 0.5 09/05/2021   ALKPHOS 101 09/05/2021   AST 12 09/05/2021   ALT 8 09/05/2021   PROT 6.4 09/05/2021   ALBUMIN 3.9 09/05/2021   CALCIUM 8.8 09/05/2021   ANIONGAP 10 08/30/2021   GFR 23.96 (L) 09/05/2021   Lab Results  Component Value Date   CHOL 116 03/29/2021   Lab Results  Component Value Date   HDL 44.00 03/29/2021   Lab Results  Component Value Date   LDLCALC 58 03/29/2021   Lab Results  Component Value Date   TRIG 67.0 03/29/2021   Lab Results  Component Value Date   CHOLHDL 3  03/29/2021   Lab Results  Component Value Date   HGBA1C 6.4 (A) 07/04/2021      Assessment & Plan:   Problem List Items Addressed This Visit       Cardiovascular and Mediastinum   Essential hypertension    Blood pressure within normal limits in office.  Patient has a tried several cuffs at home without good success of having them being accurate per her report.  She is being managed by cardiology continue medications as prescribed by them and follow-up as scheduled.      Relevant Orders   CBC   Comprehensive metabolic panel   Lipid panel   Stroke [facility]) status post IV TPA     Endocrine   Type 2 diabetes mellitus without complication, without long-term current use of insulin (HCC)    Checks glucose twice daily has had a recent high of 399 when she was sick and unable to hold in her medications today was 189.  Did check A1c in office.  Will not change medications at current time did discuss working on diet modifications and increasing physical activity.  We will see back in recheck A1c in approximately 14 weeks.  Continue checking glucose at home.  Continue taking medications of metformin, glipizide, linagliptin.      Relevant Orders   CBC   Comprehensive metabolic panel   POCT glycosylated hemoglobin (Hb A1C) (Completed)   Lipid panel     Genitourinary   CKD stage G4/A1, GFR 15-29 and albumin creatinine ratio <30 mg/g [facility])    Patient currently followed by nephrology.  Check renal function today as patient has had episodes of nausea vomiting the week and absent resolved.  Per report has not put out much urine output pending lab results      Relevant Orders   Comprehensive metabolic panel     Other   Hyperlipidemia    Currently maintained on Repatha to atorvastatin.  Continue medications pending lipid panel today      Localized edema    Currently maintained on diuretics inclusive of spironolactone and furosemide.  Continue taking medication as prescribed follow-up with  cardiology as recommended      Other fatigue   Relevant Orders   TSH   Medicare annual wellness visit, subsequent - Primary    Reviewed paperwork with patient we will scanned to medical record.  Reviewed appropriate immunizations and health screening exams with patient.  Continue working on lifestyle modifications      Other Visit Diagnoses     Medication refill       Relevant Medications   ondansetron (ZOFRAN-ODT) 4 MG disintegrating tablet   Encounter for screening mammogram for malignant neoplasm of breast       Relevant Orders   MM Digital Screening       No orders of the defined types were placed in this encounter.   Follow-up: Return in about 14 weeks (around 02/12/2022) for DM recheck.   This visit occurred during the SARS-CoV-2 public health emergency.  Safety protocols were in place, including screening questions prior to the visit, additional usage of staff PPE, and extensive cleaning of exam room while observing appropriate contact time as indicated for disinfecting solutions.   Romilda Garret, NP

## 2021-11-06 NOTE — Assessment & Plan Note (Signed)
Blood pressure within normal limits in office.  Patient has a tried several cuffs at home without good success of having them being accurate per her report.  She is being managed by cardiology continue medications as prescribed by them and follow-up as scheduled.

## 2021-11-06 NOTE — Assessment & Plan Note (Signed)
Checks glucose twice daily has had a recent high of 399 when she was sick and unable to hold in her medications today was 189.  Did check A1c in office.  Will not change medications at current time did discuss working on diet modifications and increasing physical activity.  We will see back in recheck A1c in approximately 14 weeks.  Continue checking glucose at home.  Continue taking medications of metformin, glipizide, linagliptin.

## 2021-11-07 ENCOUNTER — Telehealth: Payer: Self-pay | Admitting: Radiology

## 2021-11-07 LAB — LIPID PANEL
Cholesterol: 169 mg/dL (ref 0–200)
HDL: 47.5 mg/dL (ref >39.00)
LDL Cholesterol: 104 mg/dL — ABNORMAL HIGH (ref 0–99)
NonHDL: 121.89
Total CHOL/HDL Ratio: 4
Triglycerides: 91 mg/dL (ref 0.0–149.0)
VLDL: 18.2 mg/dL (ref 0.0–40.0)

## 2021-11-07 LAB — COMPREHENSIVE METABOLIC PANEL
ALT: 9 U/L (ref 0–35)
AST: 15 U/L (ref 0–37)
Albumin: 3.8 g/dL (ref 3.5–5.2)
Alkaline Phosphatase: 91 U/L (ref 39–117)
BUN: 40 mg/dL — ABNORMAL HIGH (ref 6–23)
CO2: 26 mEq/L (ref 19–32)
Calcium: 8.8 mg/dL (ref 8.4–10.5)
Chloride: 99 mEq/L (ref 96–112)
Creatinine, Ser: 3.78 mg/dL — ABNORMAL HIGH (ref 0.40–1.20)
GFR: 11.28 mL/min — CL (ref >60.00)
Glucose, Bld: 72 mg/dL (ref 70–99)
Potassium: 4.4 mEq/L (ref 3.5–5.1)
Sodium: 136 mEq/L (ref 135–145)
Total Bilirubin: 0.5 mg/dL (ref 0.2–1.2)
Total Protein: 6.2 g/dL (ref 6.0–8.3)

## 2021-11-07 LAB — CBC
HCT: 31.9 % — ABNORMAL LOW (ref 36.0–46.0)
Hemoglobin: 9.8 g/dL — ABNORMAL LOW (ref 12.0–15.0)
MCHC: 30.8 g/dL (ref 30.0–36.0)
MCV: 71.2 fl — ABNORMAL LOW (ref 78.0–100.0)
Platelets: 220 10*3/uL (ref 150.0–400.0)
RBC: 4.48 Mil/uL (ref 3.87–5.11)
RDW: 17.5 % — ABNORMAL HIGH (ref 11.5–15.5)
WBC: 6 10*3/uL (ref 4.0–10.5)

## 2021-11-07 LAB — TSH: TSH: 1.12 u[IU]/mL (ref 0.35–5.50)

## 2021-11-07 NOTE — Telephone Encounter (Signed)
[facility] called a critical GFR - 11.28, results given to [facility]

## 2021-11-08 ENCOUNTER — Other Ambulatory Visit: Payer: Self-pay

## 2021-11-08 ENCOUNTER — Telehealth: Payer: Self-pay | Admitting: Nurse Practitioner

## 2021-11-08 ENCOUNTER — Encounter (HOSPITAL_COMMUNITY): Payer: Self-pay

## 2021-11-08 ENCOUNTER — Other Ambulatory Visit: Payer: Self-pay | Admitting: Nurse Practitioner

## 2021-11-08 ENCOUNTER — Emergency Department (HOSPITAL_COMMUNITY)
Admission: EM | Admit: 2021-11-08 | Discharge: 2021-11-08 | Disposition: A | Discharge status: Home / Self Care | Payer: Medicare PPO | Attending: Student | Admitting: Student

## 2021-11-08 ENCOUNTER — Other Ambulatory Visit (INDEPENDENT_AMBULATORY_CARE_PROVIDER_SITE_OTHER): Payer: Medicare PPO

## 2021-11-08 DIAGNOSIS — R799 Abnormal finding of blood chemistry, unspecified: Secondary | ICD-10-CM | POA: Insufficient documentation

## 2021-11-08 DIAGNOSIS — I2510 Atherosclerotic heart disease of native coronary artery without angina pectoris: Secondary | ICD-10-CM | POA: Diagnosis not present

## 2021-11-08 DIAGNOSIS — Z7984 Long term (current) use of oral hypoglycemic drugs: Secondary | ICD-10-CM | POA: Diagnosis not present

## 2021-11-08 DIAGNOSIS — R7989 Other specified abnormal findings of blood chemistry: Secondary | ICD-10-CM

## 2021-11-08 DIAGNOSIS — Z86010 Personal history of colonic polyps: Secondary | ICD-10-CM | POA: Insufficient documentation

## 2021-11-08 DIAGNOSIS — R944 Abnormal results of kidney function studies: Secondary | ICD-10-CM | POA: Diagnosis not present

## 2021-11-08 DIAGNOSIS — I1310 Hypertensive heart and chronic kidney disease without heart failure, with stage 1 through stage 4 chronic kidney disease, or unspecified chronic kidney disease: Secondary | ICD-10-CM | POA: Insufficient documentation

## 2021-11-08 DIAGNOSIS — Z7982 Long term (current) use of aspirin: Secondary | ICD-10-CM | POA: Diagnosis not present

## 2021-11-08 DIAGNOSIS — N184 Chronic kidney disease, stage 4 (severe): Secondary | ICD-10-CM | POA: Diagnosis not present

## 2021-11-08 DIAGNOSIS — R710 Precipitous drop in hematocrit: Secondary | ICD-10-CM | POA: Diagnosis not present

## 2021-11-08 DIAGNOSIS — E1122 Type 2 diabetes mellitus with diabetic chronic kidney disease: Secondary | ICD-10-CM | POA: Diagnosis not present

## 2021-11-08 DIAGNOSIS — Z79899 Other long term (current) drug therapy: Secondary | ICD-10-CM | POA: Insufficient documentation

## 2021-11-08 LAB — URINALYSIS, ROUTINE W REFLEX MICROSCOPIC
Bilirubin Urine: NEGATIVE
Glucose, UA: NEGATIVE mg/dL
Ketones, ur: NEGATIVE mg/dL
Nitrite: NEGATIVE
Protein, ur: NEGATIVE mg/dL
Specific Gravity, Urine: 1.008 (ref 1.005–1.030)
pH: 5 (ref 5.0–8.0)

## 2021-11-08 LAB — CBC WITH DIFFERENTIAL/PLATELET
Abs Immature Granulocytes: 0 10*3/uL (ref 0.00–0.07)
Basophils Absolute: 0 10*3/uL (ref 0.0–0.1)
Basophils Relative: 1 %
Eosinophils Absolute: 0.1 10*3/uL (ref 0.0–0.5)
Eosinophils Relative: 3 %
HCT: 33.6 % — ABNORMAL LOW (ref 36.0–46.0)
Hemoglobin: 10 g/dL — ABNORMAL LOW (ref 12.0–15.0)
Immature Granulocytes: 0 %
Lymphocytes Relative: 21 %
Lymphs Abs: 0.9 10*3/uL (ref 0.7–4.0)
MCH: 22.2 pg — ABNORMAL LOW (ref 26.0–34.0)
MCHC: 29.8 g/dL — ABNORMAL LOW (ref 30.0–36.0)
MCV: 74.7 fL — ABNORMAL LOW (ref 80.0–100.0)
Monocytes Absolute: 0.4 10*3/uL (ref 0.1–1.0)
Monocytes Relative: 9 %
Neutro Abs: 2.8 10*3/uL (ref 1.7–7.7)
Neutrophils Relative %: 66 %
Platelets: 232 10*3/uL (ref 150–400)
RBC: 4.5 MIL/uL (ref 3.87–5.11)
RDW: 16.7 % — ABNORMAL HIGH (ref 11.5–15.5)
WBC: 4.3 10*3/uL (ref 4.0–10.5)
nRBC: 0 % (ref 0.0–0.2)

## 2021-11-08 LAB — COMPREHENSIVE METABOLIC PANEL
ALT: 12 U/L (ref 0–44)
AST: 14 U/L — ABNORMAL LOW (ref 15–41)
Albumin: 3.7 g/dL (ref 3.5–5.0)
Alkaline Phosphatase: 93 U/L (ref 38–126)
Anion gap: 7 (ref 5–15)
BUN: 40 mg/dL — ABNORMAL HIGH (ref 8–23)
CO2: 24 mmol/L (ref 22–32)
Calcium: 8.5 mg/dL — ABNORMAL LOW (ref 8.9–10.3)
Chloride: 102 mmol/L (ref 98–111)
Creatinine, Ser: 2.74 mg/dL — ABNORMAL HIGH (ref 0.44–1.00)
GFR, Estimated: 18 mL/min — ABNORMAL LOW (ref >60)
Glucose, Bld: 176 mg/dL — ABNORMAL HIGH (ref 70–99)
Potassium: 4.7 mmol/L (ref 3.5–5.1)
Sodium: 133 mmol/L — ABNORMAL LOW (ref 135–145)
Total Bilirubin: 0.8 mg/dL (ref 0.3–1.2)
Total Protein: 6.5 g/dL (ref 6.5–8.1)

## 2021-11-08 LAB — BASIC METABOLIC PANEL
BUN: 39 mg/dL — ABNORMAL HIGH (ref 6–23)
CO2: 26 mEq/L (ref 19–32)
Calcium: 8.9 mg/dL (ref 8.4–10.5)
Chloride: 100 mEq/L (ref 96–112)
Creatinine, Ser: 2.81 mg/dL — ABNORMAL HIGH (ref 0.40–1.20)
GFR: 16.11 mL/min — ABNORMAL LOW (ref >60.00)
Glucose, Bld: 151 mg/dL — ABNORMAL HIGH (ref 70–99)
Potassium: 4.6 mEq/L (ref 3.5–5.1)
Sodium: 132 mEq/L — ABNORMAL LOW (ref 135–145)

## 2021-11-08 LAB — CBC
HCT: 32 % — ABNORMAL LOW (ref 36.0–46.0)
Hemoglobin: 10 g/dL — ABNORMAL LOW (ref 12.0–15.0)
MCHC: 31.3 g/dL (ref 30.0–36.0)
MCV: 70 fl — ABNORMAL LOW (ref 78.0–100.0)
Platelets: 224 10*3/uL (ref 150.0–400.0)
RBC: 4.57 Mil/uL (ref 3.87–5.11)
RDW: 16.8 % — ABNORMAL HIGH (ref 11.5–15.5)
WBC: 4.3 10*3/uL (ref 4.0–10.5)

## 2021-11-08 MED ORDER — LACTATED RINGERS IV BOLUS
1000.0000 mL | Freq: Once | INTRAVENOUS | Status: AC
  Administered 2021-11-08: 18:00:00 1000 mL via INTRAVENOUS

## 2021-11-08 NOTE — ED Triage Notes (Signed)
Pt states she was sent by her PCP for "abnormal kidney function" and states she was told her kidney function is getting worse and to "come to ED for rehydration." Denies pain but reports trouble urinating for a week. Not on dialysis at this time.

## 2021-11-08 NOTE — Discharge Instructions (Addendum)
You were seen in the emergency department for evaluation of abnormal labs.  While here in the emergency department, your creatinine has improved to 2.7 from a previously documented 3.73 days ago.  This is likely improved because you are able to eat and drink on her own.  We gave you IV fluids here to ensure that you continue to improve but please call your primary care physician to set up repeat lab testing next week.  At this time you are safe for discharge.  Please return the emergency department if you have persistent vomiting, fever, chest pain, shortness of breath or any other concerning symptoms.

## 2021-11-08 NOTE — Telephone Encounter (Signed)
I called and spoke to [facility] yesterday. They stated they would reach out to patient and that they would try and see her with the next available. She has not been contacted by the office at time of lab draw today  Patient came back today for lab recheck and told lab tech that she is feeling worse and her urine output is not any better. Recommend that she go to the ED for further evaluation. Patient currently in [facility] long ed

## 2021-11-08 NOTE — ED Provider Notes (Signed)
Emergency Medicine Provider Triage Evaluation Note  Alicia [name] , a 75 y.o. female  was evaluated in triage.  Pt complains of abnormal lab work.  States that her PCP told her that her kidney function and hemoglobin are abnormal..  Patient reports that last week she had flulike illness and had decreased p.o. intake.  Patient states that she has been drinking normally now however has had decreased urinary output since Sunday.  Denies any dysuria, hematuria, urinary urgency.  Patient also reports that she is having diarrhea.  States that her stools are black in color.  States that this is normal for her but she is currently only taking iron pills.  Review of Systems  Positive:  decreased urinary output Negative: Fever, chills, abdominal pain, nausea, vomiting, dysuria, hematuria, urinary urgency  Physical Exam  BP (!) 165/76 (BP Location: Left Arm)    Pulse 82    Temp 99.4 F (37.4 C) (Oral)    Resp 17    Ht 5\' 5"  (1.651 m)    Wt (!) 137.4 kg    SpO2 98%    BMI 50.42 kg/m  Gen:   Awake, no distress   Resp:  Normal effort  MSK:   Moves extremities without difficulty  Other:  Abdomen soft, nondistended, nontender.  Medical Decision Making  Medically screening exam initiated at 3:21 PM.  Appropriate orders placed.  [name] was informed that the remainder of the evaluation will be completed by another provider, this initial triage assessment does not replace that evaluation, and the importance of remaining in the ED until their evaluation is complete.     Loni Beckwith, PA-C 11/08/21 1523    Daleen Bo, MD 11/09/21 [phone]

## 2021-11-08 NOTE — ED Provider Notes (Signed)
[facility] DEPT Provider Note   CSN: [id number] Arrival date & time: 11/08/21  1454     History Chief Complaint  Patient presents with   Abnormal Lab    Alicia [name] is a 75 y.o. female with PMH CAD s/p DES to RCA, diabetes, CKD, HLD, obesity, previous CVA who presents to the emergency department for evaluation of abnormal labs.  Patient had a flulike illness last week with copious vomiting and diarrhea that has since resolved and she is been able to tolerate p.o. over the last 4 days.  She had routine blood work performed on 11/06/2021 that saw a significant creatinine elevation from baseline of 2.0 to 3.78.  She was then sent to the emergency department for rehydration.  She currently denies any chest pain, shortness of breath, abdominal pain, nausea, vomiting, diarrhea or any other systemic symptoms.   Abnormal Lab     Past Medical History:  Diagnosis Date   Anemia    Arthritis    "right knee" (10/21/2015)   CAD (coronary artery disease)    a. Cath 10/21/15: s/p DES to RCA: moderate diffuse stenosis of mod diagonal, diffuse irregularity LCx and LAD. 2D echo 10/21/15: mod LVH, EF 60-65%, no RWMA, calcified MV, mod-severe LAE. 03/18/17 PCI with DES--> 1 diag   Cataract    Diabetes ([place])    Essential hypertension    Family history of adverse reaction to anesthesia    "oldest sister, [name], related to brain formation at back of head; when they put her to sleep it's hard to wake her up"   GERD (gastroesophageal reflux disease)    History of blood transfusion    "related to ruptured tubal pregnancy"   Hyperlipidemia    Hypertensive heart disease    Morbid obesity ([place])    Stroke (cerebrum) ([place])    Tubular adenoma of colon    Type II diabetes mellitus ([place])     Patient Active Problem List   Diagnosis Date Noted   CKD stage G4/A1, GFR 15-29 and albumin creatinine ratio <30 mg/g ([place]) 11/06/2021   Other fatigue 11/06/2021   Medicare annual  wellness visit, subsequent 11/06/2021   Paresthesia 09/05/2021   Cataracts, bilateral 07/04/2021   Iron deficiency anemia 04/05/2021   Leg cramping 03/29/2021   Abnormal findings on esophagogastroduodenoscopy (EGD) 11/25/2020   Dysphagia    Gastritis without bleeding    Duodenal nodule    History of colonic polyps    Benign neoplasm of descending colon    OSA (obstructive sleep apnea) 01/20/2019   Stroke ([place]) status post IV TPA 01/18/2019   OA (osteoarthritis) of knee 12/20/2017   Type 2 diabetes mellitus without complication, without long-term current use of insulin ([place]) 12/20/2017   Progressive angina ([facility])    Localized edema 02/23/2016   CAD S/P percutaneous coronary angioplasty 10/22/2015   Essential hypertension 10/22/2015   Microcytic anemia 10/22/2015   Hyperlipidemia 10/09/2012   GERD (gastroesophageal reflux disease) 10/09/2012    Past Surgical History:  Procedure Laterality Date   ABDOMINAL HYSTERECTOMY  1988   fibroid   [place]   BIOPSY  07/18/2020   Procedure: BIOPSY;  Surgeon: [name], MD;  Location: [facility];  Service: Gastroenterology;;   CARDIAC CATHETERIZATION N/A 10/21/2015   Procedure: Right/Left Heart Cath and Coronary Angiography;  Surgeon: [name], MD;  Location: [facility];  Service: Cardiovascular;  Laterality: N/A;   CHOLECYSTECTOMY OPEN  1984  COLONOSCOPY WITH PROPOFOL N/A 07/18/2020   Procedure: COLONOSCOPY WITH PROPOFOL;  Surgeon: [name], MD;  Location: [facility];  Service: Gastroenterology;  Laterality: N/A;   CORONARY STENT INTERVENTION N/A 03/18/2017   Procedure: Coronary Stent Intervention;  Surgeon: [name], MD;  Location: [facility];  Service: Cardiovascular;  Laterality: N/A;   CORONARY STENT PLACEMENT  03/18/2017   A OPTIMIZE STUDY Drug Eluting Stent (2.5 mm 18 mm - post-dilated to 2.7 mm) was successfully placed   ECTOPIC PREGNANCY SURGERY      ENDOSCOPIC MUCOSAL RESECTION N/A 01/23/2021   Procedure: ENDOSCOPIC MUCOSAL RESECTION;  Surgeon: Irving Copas., MD;  Location: [facility];  Service: Gastroenterology;  Laterality: N/A;   ESOPHAGOGASTRODUODENOSCOPY (EGD) WITH PROPOFOL N/A 07/18/2020   Procedure: ESOPHAGOGASTRODUODENOSCOPY (EGD) WITH PROPOFOL;  Surgeon: [name], MD;  Location: [facility];  Service: Gastroenterology;  Laterality: N/A;   ESOPHAGOGASTRODUODENOSCOPY (EGD) WITH PROPOFOL N/A 01/23/2021   Procedure: ESOPHAGOGASTRODUODENOSCOPY (EGD) WITH PROPOFOL;  Surgeon: Rush Landmark Telford Nab., MD;  Location: [facility];  Service: Gastroenterology;  Laterality: N/A;   EUS N/A 01/23/2021   Procedure: UPPER ENDOSCOPIC ULTRASOUND (EUS) RADIAL;  Surgeon: Irving Copas., MD;  Location: [facility];  Service: Gastroenterology;  Laterality: N/A;   HEMOSTASIS CLIP PLACEMENT  01/23/2021   Procedure: HEMOSTASIS CLIP PLACEMENT;  Surgeon: Irving Copas., MD;  Location: [facility];  Service: Gastroenterology;;   INTRAVASCULAR PRESSURE WIRE/FFR STUDY N/A 09/04/2019   Procedure: INTRAVASCULAR PRESSURE WIRE/FFR STUDY;  Surgeon: Nelva Bush, MD;  Location: [facility];  Service: Cardiovascular;  Laterality: N/A;   LEFT HEART CATH AND CORONARY ANGIOGRAPHY N/A 03/18/2017   Procedure: Left Heart Cath and Coronary Angiography;  Surgeon: [name], MD;  Location: [facility];  Service: Cardiovascular;  Laterality: N/A;   LEFT HEART CATH AND CORONARY ANGIOGRAPHY N/A 09/04/2019   Procedure: LEFT HEART CATH AND CORONARY ANGIOGRAPHY;  Surgeon: Nelva Bush, MD;  Location: [facility];  Service: Cardiovascular;  Laterality: N/A;   POLYPECTOMY  07/18/2020   Procedure: POLYPECTOMY;  Surgeon: [name], MD;  Location: [facility];  Service: Gastroenterology;;   REDUCTION MAMMAPLASTY Bilateral ~ [place] INJECTION  01/23/2021   Procedure: SUBMUCOSAL LIFTING INJECTION;  Surgeon:  Irving Copas., MD;  Location: [facility];  Service: Gastroenterology;;   TONSILLECTOMY       OB History   No obstetric history on file.     Family History  Problem Relation Age of Onset   Diabetes Mother    Heart attack Mother 63       2007   Hypertension Mother    Hyperlipidemia Mother    Heart disease Mother    Heart attack Father    Stroke Father 18   Hypertension Father    Hyperlipidemia Father    Cancer Sister        Gastric cancer in 2013   Stomach cancer Sister        in the cavity behind the stomach   Diabetes Sister    Diabetes Sister    Diabetes Sister    Migraines Daughter        brain surgery   Migraines Daughter    Hypertension Son        weight loss corrected that   Breast cancer Neg Hx    Colon cancer Neg Hx    Inflammatory bowel disease Neg Hx    Liver disease Neg Hx    Esophageal cancer Neg  Hx    Pancreatic cancer Neg Hx    Rectal cancer Neg Hx     Social History   Tobacco Use   Smoking status: Never   Smokeless tobacco: Never  Vaping Use   Vaping Use: Never used  Substance Use Topics   Alcohol use: No   Drug use: No    Home Medications Prior to Admission medications   Medication Sig Start Date End Date Taking? Authorizing Provider  amLODipine (NORVASC) 10 MG tablet TAKE 1 TABLET BY MOUTH EVERY DAY Patient taking differently: Take 10 mg by mouth daily. 05/11/21  Yes Lendon Colonel, NP  aspirin EC 81 MG tablet Take 1 tablet (81 mg total) by mouth daily. Swallow whole. 01/24/21  Yes Mansouraty, Telford Nab., MD  atorvastatin (LIPITOR) 80 MG tablet TAKE 1 TABLET BY MOUTH DAILY AT 6 PM. Patient taking differently: Take 80 mg by mouth at bedtime. 11/21/20  Yes [name], MD  carvedilol (COREG) 25 MG tablet TAKE 2 TABLETS (50 MG TOTAL) BY MOUTH 2 (TWO) TIMES DAILY WITH A MEAL. 12/22/20  Yes [name], MD  clopidogrel (PLAVIX) 75 MG tablet Take 1 tablet (75 mg total) by mouth daily. 05/29/21  Yes [name], MD  doxylamine,  Sleep, (UNISOM) 25 MG tablet Take 25 mg by mouth at bedtime.   Yes [provider]  Evolocumab (REPATHA SURECLICK) 947 MG/ML SOAJ INJECT 1 PEN INTO THE SKIN EVERY 14 (FOURTEEN) DAYS. 10/30/21  Yes [name], MD  ferrous sulfate 325 (65 FE) MG tablet TAKE 1 TABLET BY MOUTH EVERY DAY WITH BREAKFAST Patient taking differently: Take 325 mg by mouth daily with breakfast. 11/01/21  Yes Ria Bush, MD  furosemide (LASIX) 20 MG tablet TAKE 1 TABLET BY MOUTH EVERY DAY 08/14/21  Yes [name], MD  isosorbide mononitrate (IMDUR) 60 MG 24 hr tablet TAKE 1 TABLET BY MOUTH EVERY DAY Patient taking differently: Take 60 mg by mouth daily. 05/26/21  Yes [name], MD  linagliptin (TRADJENTA) 5 MG TABS tablet TAKE 1 TABLET (5 MG TOTAL) BY MOUTH DAILY. Patient taking differently: Take 5 mg by mouth daily. 03/29/21  Yes Lesleigh Noe, MD  losartan (COZAAR) 100 MG tablet Take 1 tablet (100 mg total) by mouth daily. 06/08/21  Yes [name], MD  metFORMIN (GLUCOPHAGE) 1000 MG tablet Take 1 tablet (1,000 mg total) by mouth 2 (two) times daily with a meal. 07/21/21  Yes Lesleigh Noe, MD  nitroGLYCERIN (NITROSTAT) 0.4 MG SL tablet Place 1 tablet (0.4 mg total) under the tongue every 5 (five) minutes as needed for chest pain. 09/02/19  Yes Imogene Burn, PA-C  omeprazole (PRILOSEC) 40 MG capsule TAKE 1 CAPSULE BY MOUTH EVERY DAY Patient taking differently: Take 40 mg by mouth daily. 07/04/21  Yes Lesleigh Noe, MD  ondansetron (ZOFRAN-ODT) 4 MG disintegrating tablet TAKE 1 TABLET BY MOUTH EVERY 8 HOURS AS NEEDED FOR NAUSEA AND VOMITING Patient taking differently: Take 4 mg by mouth every 8 (eight) hours as needed for vomiting or nausea. 11/06/21  Yes Michela Pitcher, NP  spironolactone (ALDACTONE) 25 MG tablet TAKE 1 TABLET BY MOUTH EVERY DAY Patient taking differently: Take 25 mg by mouth daily. 05/26/21  Yes [name], MD  vitamin B-12 (CYANOCOBALAMIN) 500 MCG tablet Take 2 tablets (1,000  mcg total) by mouth 2 (two) times daily. 05/16/21  Yes Dutch Quint B, FNP  Vitamin D, Ergocalciferol, (DRISDOL) 1.25 MG (50000 UNIT) CAPS capsule  Take 1 capsule (50,000 Units total) by mouth every 7 (seven) days. Please return for vit d level check on 02/10/21. 12/14/20  Yes [name], MD  Cholecalciferol 50 MCG (2000 UT) TABS Take one tablet daily alternating with two tablets daily Patient not taking: Reported on 11/08/2021 02/16/21   [name], MD  glipiZIDE (GLUCOTROL) 10 MG tablet Take 1 tablet (10 mg total) by mouth 2 (two) times daily before a meal. 03/29/21   Lesleigh Noe, MD  glucose blood (ONETOUCH ULTRA) test strip 1 each by Other route 2 (two) times daily. LAST REFILL SCHEDULE PHYSICAL 01/18/20   Jearld Fenton, NP  losartan (COZAAR) 50 MG tablet Take 1 tablet (50 mg total) by mouth daily. 12/14/20   [name], MD  Magnesium 200 MG TABS Take 200 mg by mouth daily.    [provider]    Allergies    Iodinated diagnostic agents and Sulfa antibiotics  Review of Systems   Review of Systems  Constitutional:  Negative for chills and fever.  HENT:  Negative for ear pain and sore throat.   Eyes:  Negative for pain and visual disturbance.  Respiratory:  Negative for cough and shortness of breath.   Cardiovascular:  Negative for chest pain and palpitations.  Gastrointestinal:  Negative for abdominal pain and vomiting.  Genitourinary:  Negative for dysuria and hematuria.  Musculoskeletal:  Negative for arthralgias and back pain.  Skin:  Negative for color change and rash.  Neurological:  Negative for seizures and syncope.  All other systems reviewed and are negative.  Physical Exam Updated Vital Signs BP (!) 177/83    Pulse 84    Temp 99.4 F (37.4 C) (Oral)    Resp 15    Ht 5\' 5"  (1.651 m)    Wt (!) 137.4 kg    SpO2 100%    BMI 50.42 kg/m   Physical Exam Vitals and nursing note reviewed.  Constitutional:      General: She is not in acute distress.     Appearance: She is well-developed.  HENT:     Head: Normocephalic and atraumatic.  Eyes:     Conjunctiva/sclera: Conjunctivae normal.  Cardiovascular:     Rate and Rhythm: Normal rate and regular rhythm.     Heart sounds: No murmur heard. Pulmonary:     Effort: Pulmonary effort is normal. No respiratory distress.     Breath sounds: Normal breath sounds.  Abdominal:     Palpations: Abdomen is soft.     Tenderness: There is no abdominal tenderness.  Musculoskeletal:        General: No swelling.     Cervical back: Neck supple.  Skin:    General: Skin is warm and dry.     Capillary Refill: Capillary refill takes less than 2 seconds.  Neurological:     Mental Status: She is alert.  Psychiatric:        Mood and Affect: Mood normal.    ED Results / Procedures / Treatments   Labs (all labs ordered are listed, but only abnormal results are displayed) Labs Reviewed  COMPREHENSIVE METABOLIC PANEL - Abnormal; Notable for the following components:      Result Value   Sodium 133 (*)    Glucose, Bld 176 (*)    BUN 40 (*)    Creatinine, Ser 2.74 (*)    Calcium 8.5 (*)    AST 14 (*)    GFR, Estimated 18 (*)  All other components within normal limits  CBC WITH DIFFERENTIAL/PLATELET - Abnormal; Notable for the following components:   Hemoglobin 10.0 (*)    HCT 33.6 (*)    MCV 74.7 (*)    MCH 22.2 (*)    MCHC 29.8 (*)    RDW 16.7 (*)    All other components within normal limits  URINALYSIS, ROUTINE W REFLEX MICROSCOPIC - Abnormal; Notable for the following components:   Hgb urine dipstick SMALL (*)    Leukocytes,Ua TRACE (*)    Bacteria, UA MANY (*)    All other components within normal limits    EKG None  Radiology No results found.  Procedures Procedures   Medications Ordered in ED Medications  lactated ringers bolus 1,000 mL (1,000 mLs Intravenous New Bag/Given 11/08/21 1806)    ED Course  I have reviewed the triage vital signs and the nursing  notes.  Pertinent labs & imaging results that were available during my care of the patient were reviewed by me and considered in my medical decision making (see chart for details).    MDM Rules/Calculators/A&P                          Patient seen the emergency department for evaluation of abnormal labs.  Physical exam is unremarkable.  Laboratory evaluation with a creatinine of 2.74 which is likely increased on its own since her previous creatinine of 3.7 due to the patient's ability to tolerate oral p.o.  Hemoglobin is stable at 10.0.  Urinalysis unremarkable.  Patient with a likely prerenal azotemia and she was given 1 L lactated Ringer's.  I sent a message to patient's primary care physician and the patient will require outpatient follow-up to ensure that she continues to improve from a creatinine standpoint.  At this point she has not very far away from her baseline and I believe she will continue to improve now that she is able to tolerate p.o. without difficulty.  Patient then discharged.   Final Clinical Impression(s) / ED Diagnoses Final diagnoses:  None    Rx / DC Orders ED Discharge Orders     None        Dalon Reichart, Debe Coder, MD 11/08/21 [phone]

## 2021-11-15 ENCOUNTER — Encounter: Payer: Self-pay | Admitting: Nurse Practitioner

## 2021-11-15 ENCOUNTER — Ambulatory Visit (INDEPENDENT_AMBULATORY_CARE_PROVIDER_SITE_OTHER)
Admission: RE | Admit: 2021-11-15 | Discharge: 2021-11-15 | Disposition: A | Discharge status: Home / Self Care | Payer: Medicare PPO | Source: Ambulatory Visit | Attending: Nurse Practitioner | Admitting: Nurse Practitioner

## 2021-11-15 ENCOUNTER — Other Ambulatory Visit: Payer: Self-pay

## 2021-11-15 ENCOUNTER — Ambulatory Visit: Payer: Medicare PPO | Admitting: Nurse Practitioner

## 2021-11-15 ENCOUNTER — Other Ambulatory Visit: Payer: Self-pay | Admitting: Internal Medicine

## 2021-11-15 VITALS — BP 138/72 | HR 77 | Temp 97.4°F | Resp 18 | Ht 65.0 in | Wt 315.1 lb

## 2021-11-15 DIAGNOSIS — R0609 Other forms of dyspnea: Secondary | ICD-10-CM

## 2021-11-15 DIAGNOSIS — N184 Chronic kidney disease, stage 4 (severe): Secondary | ICD-10-CM

## 2021-11-15 DIAGNOSIS — R0600 Dyspnea, unspecified: Secondary | ICD-10-CM | POA: Diagnosis not present

## 2021-11-15 DIAGNOSIS — R600 Localized edema: Secondary | ICD-10-CM

## 2021-11-15 LAB — CBC
HCT: 31 % — ABNORMAL LOW (ref 36.0–46.0)
Hemoglobin: 9.6 g/dL — ABNORMAL LOW (ref 12.0–15.0)
MCHC: 30.8 g/dL (ref 30.0–36.0)
MCV: 70.9 fl — ABNORMAL LOW (ref 78.0–100.0)
Platelets: 249 10*3/uL (ref 150.0–400.0)
RBC: 4.38 Mil/uL (ref 3.87–5.11)
RDW: 17.7 % — ABNORMAL HIGH (ref 11.5–15.5)
WBC: 6 10*3/uL (ref 4.0–10.5)

## 2021-11-15 LAB — COMPREHENSIVE METABOLIC PANEL
ALT: 7 U/L (ref 0–35)
AST: 13 U/L (ref 0–37)
Albumin: 3.7 g/dL (ref 3.5–5.2)
Alkaline Phosphatase: 84 U/L (ref 39–117)
BUN: 38 mg/dL — ABNORMAL HIGH (ref 6–23)
CO2: 26 mEq/L (ref 19–32)
Calcium: 8.9 mg/dL (ref 8.4–10.5)
Chloride: 103 mEq/L (ref 96–112)
Creatinine, Ser: 2.42 mg/dL — ABNORMAL HIGH (ref 0.40–1.20)
GFR: 19.27 mL/min — ABNORMAL LOW (ref >60.00)
Glucose, Bld: 73 mg/dL (ref 70–99)
Potassium: 5.4 mEq/L — ABNORMAL HIGH (ref 3.5–5.1)
Sodium: 137 mEq/L (ref 135–145)
Total Bilirubin: 0.3 mg/dL (ref 0.2–1.2)
Total Protein: 6.4 g/dL (ref 6.0–8.3)

## 2021-11-15 LAB — BRAIN NATRIURETIC PEPTIDE: Pro B Natriuretic peptide (BNP): 200 pg/mL — ABNORMAL HIGH (ref 0.0–100.0)

## 2021-11-15 NOTE — Assessment & Plan Note (Signed)
Was seen in the emergency department for AKI.  Patient was rehydrated with a liter of fluid.  Discharged and sent for outpatient follow-up.  We will recheck kidney function today in office.  Patient did get in touch with nephrology and has an appointment for December 16, 2020.  Pending lab results.  States she is been drinking about 64 ounces of water a day and having decreased urine output encourage patient to decrease to 40 ounces a day pending labs continue taking medications including furosemide 20 mg and spironolactone 25 mg.

## 2021-11-15 NOTE — Assessment & Plan Note (Signed)
Gradual onset of DOE per patient report.  She has had a weight increase of 12 pounds this week.  Last echocardiogram was in 2020 that showed a normal EF of 60 to 65%.  She is followed by cardiology, nephrology, hematology.  Pending labs.  Chest x-ray resulted did not show frank pulmonary edema.  Patient has a 0 Wells score so low likelihood of PE.  She did get noticeably short of breath with activity but O2 sat was monitored and the lowest she dropped was 96% in office.  Gave strict signs and symptoms when to seek emergency health care.  Patient acknowledged

## 2021-11-15 NOTE — Patient Instructions (Signed)
NIce to see you today Will be in touch today regarding labs and xray results. If you get more short of breath or start having chest pain go to the emergency department

## 2021-11-15 NOTE — Progress Notes (Signed)
Established Patient Office Visit  Subjective:  Patient ID: Alicia [name], female    DOB: July 19, 1946  Age: 75 y.o. MRN: [id number]  CC:  Chief Complaint  Patient presents with   ER follow up    HPI Alicia [name] presents for ED follow up  Patient was seen for AKI and came for repeat labs and did not feel well and was sent to ED. Her kidney function was improving and she was given a liter of fluid and sent home.  Patient is here for the ED f/u. States at the beginning she was putting out urine to what she was putting in. States she has been drinking approx 64 ounces of water daily and her urine output has decreased.  She has had a marked increase in weight in one week. Approx 12 pounds.  Endorses DOE and cough. Having to sleep with the head of her bed elevated  Last Echo was done in 2020 and showed EF of 60-65%  Patient thinks she had DVT in the past. Looked at ED records from 2019 and D-dimer was elevated but DVT study was negative  Past Medical History:  Diagnosis Date   Anemia    Arthritis    "right knee" (10/21/2015)   CAD (coronary artery disease)    a. Cath 10/21/15: s/p DES to RCA: moderate diffuse stenosis of mod diagonal, diffuse irregularity LCx and LAD. 2D echo 10/21/15: mod LVH, EF 60-65%, no RWMA, calcified MV, mod-severe LAE. 03/18/17 PCI with DES--> 1 diag   Cataract    Diabetes ([place])    Essential hypertension    Family history of adverse reaction to anesthesia    "oldest sister, [name], related to brain formation at back of head; when they put her to sleep it's hard to wake her up"   GERD (gastroesophageal reflux disease)    History of blood transfusion    "related to ruptured tubal pregnancy"   Hyperlipidemia    Hypertensive heart disease    Morbid obesity ([place])    Stroke (cerebrum) ([place])    Tubular adenoma of colon    Type II diabetes mellitus ([place])     Past Surgical History:  Procedure Laterality Date   [place]   BIOPSY  07/18/2020   Procedure: BIOPSY;  Surgeon: [name], MD;  Location: [facility];  Service: Gastroenterology;;   CARDIAC CATHETERIZATION N/A 10/21/2015   Procedure: Right/Left Heart Cath and Coronary Angiography;  Surgeon: [name], MD;  Location: [facility];  Service: Cardiovascular;  Laterality: N/A;   CHOLECYSTECTOMY OPEN  1984   COLONOSCOPY WITH PROPOFOL N/A 07/18/2020   Procedure: COLONOSCOPY WITH PROPOFOL;  Surgeon: [name], MD;  Location: [facility];  Service: Gastroenterology;  Laterality: N/A;   CORONARY STENT INTERVENTION N/A 03/18/2017   Procedure: Coronary Stent Intervention;  Surgeon: [name], MD;  Location: [facility];  Service: Cardiovascular;  Laterality: N/A;   CORONARY STENT PLACEMENT  03/18/2017   A OPTIMIZE STUDY Drug Eluting Stent (2.5 mm 18 mm - post-dilated to 2.7 mm) was successfully placed   ECTOPIC PREGNANCY SURGERY     ENDOSCOPIC MUCOSAL RESECTION N/A 01/23/2021   Procedure: ENDOSCOPIC MUCOSAL RESECTION;  Surgeon: Irving Copas., MD;  Location: [facility];  Service: Gastroenterology;  Laterality: N/A;   ESOPHAGOGASTRODUODENOSCOPY (EGD) WITH PROPOFOL N/A 07/18/2020   Procedure: ESOPHAGOGASTRODUODENOSCOPY (EGD) WITH PROPOFOL;  Surgeon: Zenovia Jarred  Jerilynn Mages, MD;  Location: [facility];  Service: Gastroenterology;  Laterality: N/A;   ESOPHAGOGASTRODUODENOSCOPY (EGD) WITH PROPOFOL N/A 01/23/2021   Procedure: ESOPHAGOGASTRODUODENOSCOPY (EGD) WITH PROPOFOL;  Surgeon: Rush Landmark Telford Nab., MD;  Location: [facility];  Service: Gastroenterology;  Laterality: N/A;   EUS N/A 01/23/2021   Procedure: UPPER ENDOSCOPIC ULTRASOUND (EUS) RADIAL;  Surgeon: Irving Copas., MD;  Location: [facility];  Service: Gastroenterology;  Laterality: N/A;   HEMOSTASIS CLIP PLACEMENT  01/23/2021   Procedure: HEMOSTASIS CLIP PLACEMENT;  Surgeon: Irving Copas., MD;  Location: [facility];  Service: Gastroenterology;;   INTRAVASCULAR PRESSURE WIRE/FFR STUDY N/A 09/04/2019   Procedure: INTRAVASCULAR PRESSURE WIRE/FFR STUDY;  Surgeon: Nelva Bush, MD;  Location: [facility];  Service: Cardiovascular;  Laterality: N/A;   LEFT HEART CATH AND CORONARY ANGIOGRAPHY N/A 03/18/2017   Procedure: Left Heart Cath and Coronary Angiography;  Surgeon: [name], MD;  Location: [facility];  Service: Cardiovascular;  Laterality: N/A;   LEFT HEART CATH AND CORONARY ANGIOGRAPHY N/A 09/04/2019   Procedure: LEFT HEART CATH AND CORONARY ANGIOGRAPHY;  Surgeon: Nelva Bush, MD;  Location: [facility];  Service: Cardiovascular;  Laterality: N/A;   POLYPECTOMY  07/18/2020   Procedure: POLYPECTOMY;  Surgeon: [name], MD;  Location: [facility];  Service: Gastroenterology;;   REDUCTION MAMMAPLASTY Bilateral ~ [place] INJECTION  01/23/2021   Procedure: SUBMUCOSAL LIFTING INJECTION;  Surgeon: Irving Copas., MD;  Location: [facility];  Service: Gastroenterology;;   TONSILLECTOMY      Family History  Problem Relation Age of Onset   Diabetes Mother    Heart attack Mother 76       2007   Hypertension Mother    Hyperlipidemia Mother    Heart disease Mother    Heart attack Father    Stroke Father 100   Hypertension Father    Hyperlipidemia Father    Cancer Sister        Gastric cancer in 2013   Stomach cancer Sister        in the cavity behind the stomach   Diabetes Sister    Diabetes Sister    Diabetes Sister    Migraines Daughter        brain surgery   Migraines Daughter    Hypertension Son        weight loss corrected that   Breast cancer Neg Hx    Colon cancer Neg Hx    Inflammatory bowel disease Neg Hx    Liver disease Neg Hx    Esophageal cancer Neg Hx    Pancreatic cancer Neg Hx    Rectal cancer Neg Hx     Social History   Socioeconomic History   Marital status: Divorced    Spouse name: Not on file    Number of children: 3   Years of education: doctorate   Highest education level: Not on file  Occupational History   Occupation: Teacher/retired  Tobacco Use   Smoking status: Never   Smokeless tobacco: Never  Vaping Use   Vaping Use: Never used  Substance and Sexual Activity   Alcohol use: No   Drug use: No   Sexual activity: Not Currently  Other Topics Concern   Not on file  Social History Narrative   Caffeine use-yes   Regular exercise-no   Social Determinants of Health   Financial Resource Strain: Not on file  Food Insecurity: Not on file  Transportation Needs: Not on  file  Physical Activity: Not on file  Stress: Not on file  Social Connections: Not on file  Intimate Partner Violence: Not on file    Outpatient Medications Prior to Visit  Medication Sig Dispense Refill   amLODipine (NORVASC) 10 MG tablet TAKE 1 TABLET BY MOUTH EVERY DAY (Patient taking differently: Take 10 mg by mouth daily.) 90 tablet 3   aspirin EC 81 MG tablet Take 1 tablet (81 mg total) by mouth daily. Swallow whole. 30 tablet 11   atorvastatin (LIPITOR) 80 MG tablet TAKE 1 TABLET BY MOUTH DAILY AT 6 PM. (Patient taking differently: Take 80 mg by mouth at bedtime.) 90 tablet 3   carvedilol (COREG) 25 MG tablet TAKE 2 TABLETS (50 MG TOTAL) BY MOUTH 2 (TWO) TIMES DAILY WITH A MEAL. 360 tablet 3   Cholecalciferol 50 MCG (2000 UT) TABS Take one tablet daily alternating with two tablets daily 30 tablet    clopidogrel (PLAVIX) 75 MG tablet Take 1 tablet (75 mg total) by mouth daily. 90 tablet 3   doxylamine, Sleep, (UNISOM) 25 MG tablet Take 25 mg by mouth at bedtime.     Evolocumab (REPATHA SURECLICK) 616 MG/ML SOAJ INJECT 1 PEN INTO THE SKIN EVERY 14 (FOURTEEN) DAYS. 6 mL 0   ferrous sulfate 325 (65 FE) MG tablet TAKE 1 TABLET BY MOUTH EVERY DAY WITH BREAKFAST (Patient taking differently: Take 325 mg by mouth daily with breakfast.) 90 tablet 0   furosemide (LASIX) 20 MG tablet TAKE 1 TABLET BY MOUTH  EVERY DAY 90 tablet 2   glipiZIDE (GLUCOTROL) 10 MG tablet Take 1 tablet (10 mg total) by mouth 2 (two) times daily before a meal. 180 tablet 1   glucose blood (ONETOUCH ULTRA) test strip 1 each by Other route 2 (two) times daily. LAST REFILL SCHEDULE PHYSICAL 200 strip 0   isosorbide mononitrate (IMDUR) 60 MG 24 hr tablet TAKE 1 TABLET BY MOUTH EVERY DAY (Patient taking differently: Take 60 mg by mouth daily.) 90 tablet 2   linagliptin (TRADJENTA) 5 MG TABS tablet TAKE 1 TABLET (5 MG TOTAL) BY MOUTH DAILY. (Patient taking differently: Take 5 mg by mouth daily.) 90 tablet 1   losartan (COZAAR) 100 MG tablet Take 1 tablet (100 mg total) by mouth daily. 90 tablet 1   losartan (COZAAR) 50 MG tablet Take 1 tablet (50 mg total) by mouth daily. 90 tablet 3   Magnesium 200 MG TABS Take 200 mg by mouth daily.     metFORMIN (GLUCOPHAGE) 1000 MG tablet Take 1 tablet (1,000 mg total) by mouth 2 (two) times daily with a meal. 180 tablet 1   nitroGLYCERIN (NITROSTAT) 0.4 MG SL tablet Place 1 tablet (0.4 mg total) under the tongue every 5 (five) minutes as needed for chest pain. 25 tablet 3   omeprazole (PRILOSEC) 40 MG capsule TAKE 1 CAPSULE BY MOUTH EVERY DAY (Patient taking differently: Take 40 mg by mouth daily.) 90 capsule 1   ondansetron (ZOFRAN-ODT) 4 MG disintegrating tablet TAKE 1 TABLET BY MOUTH EVERY 8 HOURS AS NEEDED FOR NAUSEA AND VOMITING (Patient taking differently: Take 4 mg by mouth every 8 (eight) hours as needed for vomiting or nausea.) 30 tablet 0   spironolactone (ALDACTONE) 25 MG tablet TAKE 1 TABLET BY MOUTH EVERY DAY (Patient taking differently: Take 25 mg by mouth daily.) 90 tablet 2   vitamin B-12 (CYANOCOBALAMIN) 500 MCG tablet Take 2 tablets (1,000 mcg total) by mouth 2 (two) times daily. 120 tablet 2   Vitamin  D, Ergocalciferol, (DRISDOL) 1.25 MG (50000 UNIT) CAPS capsule Take 1 capsule (50,000 Units total) by mouth every 7 (seven) days. Please return for vit d level check on 02/10/21. 8  capsule 0   No facility-administered medications prior to visit.    Allergies  Allergen Reactions   Iodinated Contrast Media Hives   Sulfa Antibiotics Hives    ROS Review of Systems  Constitutional:  Negative for chills and fever.  Respiratory:  Positive for cough and shortness of breath (DOE. past 4-5 days).   Cardiovascular:  Positive for leg swelling. Negative for chest pain.  Gastrointestinal:  Negative for abdominal pain, constipation, diarrhea, nausea and vomiting.  Genitourinary:  Positive for decreased urine volume. Negative for dysuria.     Objective:    Physical Exam Vitals and nursing note reviewed.  Constitutional:      Appearance: She is obese.  Cardiovascular:     Rate and Rhythm: Normal rate and regular rhythm.  Pulmonary:     Effort: Pulmonary effort is normal.     Breath sounds: Decreased breath sounds present.  Abdominal:     General: Bowel sounds are normal.  Musculoskeletal:     Right lower leg: 2+ Pitting Edema present.     Left lower leg: 2+ Pitting Edema present.  Neurological:     Mental Status: She is alert.    BP 138/72    Pulse 77    Temp (!) 97.4 F (36.3 C)    Resp 18    Ht 5\' 5"  (1.651 m)    Wt (!) 315 lb 2 oz (142.9 kg)    SpO2 98%    BMI 52.44 kg/m  Wt Readings from Last 3 Encounters:  11/15/21 (!) 315 lb 2 oz (142.9 kg)  11/08/21 (!) 303 lb (137.4 kg)  11/06/21 (!) 303 lb (137.4 kg)     Health Maintenance Due  Topic Date Due   Zoster Vaccines- Shingrix (1 of 2) Never done   DEXA SCAN  Never done    There are no preventive care reminders to display for this patient.  Lab Results  Component Value Date   TSH 1.12 11/06/2021   Lab Results  Component Value Date   WBC 4.3 11/08/2021   HGB 10.0 (L) 11/08/2021   HCT 33.6 (L) 11/08/2021   MCV 74.7 (L) 11/08/2021   PLT 232 11/08/2021   Lab Results  Component Value Date   NA 133 (L) 11/08/2021   K 4.7 11/08/2021   CO2 24 11/08/2021   GLUCOSE 176 (H) 11/08/2021   BUN 40  (H) 11/08/2021   CREATININE 2.74 (H) 11/08/2021   BILITOT 0.8 11/08/2021   ALKPHOS 93 11/08/2021   AST 14 (L) 11/08/2021   ALT 12 11/08/2021   PROT 6.5 11/08/2021   ALBUMIN 3.7 11/08/2021   CALCIUM 8.5 (L) 11/08/2021   ANIONGAP 7 11/08/2021   GFR 16.11 (L) 11/08/2021   Lab Results  Component Value Date   CHOL 169 11/06/2021   Lab Results  Component Value Date   HDL 47.50 11/06/2021   Lab Results  Component Value Date   LDLCALC 104 (H) 11/06/2021   Lab Results  Component Value Date   TRIG 91.0 11/06/2021   Lab Results  Component Value Date   CHOLHDL 4 11/06/2021   Lab Results  Component Value Date   HGBA1C 7.0 (A) 11/06/2021      Assessment & Plan:   Problem List Items Addressed This Visit       Genitourinary  CKD stage G4/A1, GFR 15-29 and albumin creatinine ratio <30 mg/g (HCC)    Was seen in the emergency department for AKI.  Patient was rehydrated with a liter of fluid.  Discharged and sent for outpatient follow-up.  We will recheck kidney function today in office.  Patient did get in touch with nephrology and has an appointment for December 16, 2020.  Pending lab results.  States she is been drinking about 64 ounces of water a day and having decreased urine output encourage patient to decrease to 40 ounces a day pending labs continue taking medications including furosemide 20 mg and spironolactone 25 mg.      Relevant Orders   Comprehensive metabolic panel     Other   Localized edema    Bilateral lower extremity pitting edema.  Does not seem any worse than normal but patient is having DOE in office.  Check basic labs including a BNP.  Pending stat lab results.      Relevant Orders   Brain natriuretic peptide   DOE (dyspnea on exertion) - Primary    Gradual onset of DOE per patient report.  She has had a weight increase of 12 pounds this week.  Last echocardiogram was in 2020 that showed a normal EF of 60 to 65%.  She is followed by cardiology,  nephrology, hematology.  Pending labs.  Chest x-ray resulted did not show frank pulmonary edema.  Patient has a 0 Wells score so low likelihood of PE.  She did get noticeably short of breath with activity but O2 sat was monitored and the lowest she dropped was 96% in office.  Gave strict signs and symptoms when to seek emergency health care.  Patient acknowledged      Relevant Orders   EKG 12-Lead (Completed)   Comprehensive metabolic panel   Brain natriuretic peptide   DG Chest 2 View (Completed)   CBC    No orders of the defined types were placed in this encounter.   Follow-up: No follow-ups on file.   This visit occurred during the SARS-CoV-2 public health emergency.  Safety protocols were in place, including screening questions prior to the visit, additional usage of staff PPE, and extensive cleaning of exam room while observing appropriate contact time as indicated for disinfecting solutions.   Romilda Garret, NP

## 2021-11-15 NOTE — Assessment & Plan Note (Signed)
Bilateral lower extremity pitting edema.  Does not seem any worse than normal but patient is having DOE in office.  Check basic labs including a BNP.  Pending stat lab results.

## 2021-11-16 ENCOUNTER — Telehealth: Payer: Self-pay | Admitting: Nurse Practitioner

## 2021-11-16 NOTE — Telephone Encounter (Signed)
Spoke with patient. Appointment made for 11/17/21 at 11:45 am

## 2021-11-16 NOTE — Telephone Encounter (Signed)
Called phone kept ringing, will try again later. I was not able to leave a message

## 2021-11-16 NOTE — Telephone Encounter (Signed)
Called unable to reach patient, will call again later

## 2021-11-16 NOTE — Telephone Encounter (Signed)
Spoke to patient yesterday. She will need a lab visit for Friday. Can we call and get that set up. I would like it around lunch time hopefully giving that medicine she is taking in the morning time to work.  Thanks

## 2021-11-17 ENCOUNTER — Other Ambulatory Visit: Payer: Self-pay | Admitting: Nurse Practitioner

## 2021-11-17 ENCOUNTER — Other Ambulatory Visit: Payer: Self-pay

## 2021-11-17 ENCOUNTER — Other Ambulatory Visit (INDEPENDENT_AMBULATORY_CARE_PROVIDER_SITE_OTHER): Payer: Medicare PPO

## 2021-11-17 DIAGNOSIS — E875 Hyperkalemia: Secondary | ICD-10-CM | POA: Diagnosis not present

## 2021-11-17 DIAGNOSIS — R7989 Other specified abnormal findings of blood chemistry: Secondary | ICD-10-CM

## 2021-11-17 DIAGNOSIS — N184 Chronic kidney disease, stage 4 (severe): Secondary | ICD-10-CM

## 2021-11-17 LAB — BASIC METABOLIC PANEL
BUN: 39 mg/dL — ABNORMAL HIGH (ref 6–23)
CO2: 27 mEq/L (ref 19–32)
Calcium: 8.9 mg/dL (ref 8.4–10.5)
Chloride: 105 mEq/L (ref 96–112)
Creatinine, Ser: 2.47 mg/dL — ABNORMAL HIGH (ref 0.40–1.20)
GFR: 18.8 mL/min — ABNORMAL LOW (ref >60.00)
Glucose, Bld: 80 mg/dL (ref 70–99)
Potassium: 5.3 mEq/L — ABNORMAL HIGH (ref 3.5–5.1)
Sodium: 139 mEq/L (ref 135–145)

## 2021-11-17 LAB — BRAIN NATRIURETIC PEPTIDE: Pro B Natriuretic peptide (BNP): 107 pg/mL — ABNORMAL HIGH (ref 0.0–100.0)

## 2021-11-17 NOTE — Addendum Note (Signed)
Addended by: Ellamae Sia on: 11/17/2021 08:19 AM   Modules accepted: Orders

## 2021-11-22 DIAGNOSIS — I2510 Atherosclerotic heart disease of native coronary artery without angina pectoris: Secondary | ICD-10-CM | POA: Diagnosis not present

## 2021-11-22 DIAGNOSIS — E785 Hyperlipidemia, unspecified: Secondary | ICD-10-CM | POA: Diagnosis not present

## 2021-11-22 DIAGNOSIS — N183 Chronic kidney disease, stage 3 unspecified: Secondary | ICD-10-CM | POA: Diagnosis not present

## 2021-11-22 DIAGNOSIS — E1122 Type 2 diabetes mellitus with diabetic chronic kidney disease: Secondary | ICD-10-CM | POA: Diagnosis not present

## 2021-11-22 DIAGNOSIS — R200 Anesthesia of skin: Secondary | ICD-10-CM | POA: Diagnosis not present

## 2021-11-22 DIAGNOSIS — I129 Hypertensive chronic kidney disease with stage 1 through stage 4 chronic kidney disease, or unspecified chronic kidney disease: Secondary | ICD-10-CM | POA: Diagnosis not present

## 2021-11-22 DIAGNOSIS — D631 Anemia in chronic kidney disease: Secondary | ICD-10-CM | POA: Diagnosis not present

## 2021-12-03 ENCOUNTER — Other Ambulatory Visit: Payer: Self-pay | Admitting: Family Medicine

## 2021-12-03 ENCOUNTER — Other Ambulatory Visit: Payer: Self-pay | Admitting: Internal Medicine

## 2021-12-03 DIAGNOSIS — I10 Essential (primary) hypertension: Secondary | ICD-10-CM

## 2021-12-03 DIAGNOSIS — E7800 Pure hypercholesterolemia, unspecified: Secondary | ICD-10-CM

## 2021-12-03 DIAGNOSIS — I2510 Atherosclerotic heart disease of native coronary artery without angina pectoris: Secondary | ICD-10-CM

## 2021-12-03 DIAGNOSIS — Z9861 Coronary angioplasty status: Secondary | ICD-10-CM

## 2021-12-14 ENCOUNTER — Telehealth: Payer: Self-pay | Admitting: Hematology and Oncology

## 2021-12-14 ENCOUNTER — Telehealth: Payer: Self-pay | Admitting: Nurse Practitioner

## 2021-12-14 ENCOUNTER — Other Ambulatory Visit: Payer: Self-pay

## 2021-12-14 ENCOUNTER — Inpatient Hospital Stay: Payer: Medicare PPO | Admitting: Hematology and Oncology

## 2021-12-14 ENCOUNTER — Inpatient Hospital Stay: Payer: Medicare PPO | Attending: Hematology and Oncology

## 2021-12-14 VITALS — BP 157/70 | HR 82 | Temp 98.2°F | Resp 20 | Ht 65.0 in | Wt 307.1 lb

## 2021-12-14 DIAGNOSIS — I129 Hypertensive chronic kidney disease with stage 1 through stage 4 chronic kidney disease, or unspecified chronic kidney disease: Secondary | ICD-10-CM | POA: Diagnosis not present

## 2021-12-14 DIAGNOSIS — Z800 Family history of malignant neoplasm of digestive organs: Secondary | ICD-10-CM | POA: Insufficient documentation

## 2021-12-14 DIAGNOSIS — N189 Chronic kidney disease, unspecified: Secondary | ICD-10-CM | POA: Insufficient documentation

## 2021-12-14 DIAGNOSIS — D509 Iron deficiency anemia, unspecified: Secondary | ICD-10-CM

## 2021-12-14 DIAGNOSIS — E1122 Type 2 diabetes mellitus with diabetic chronic kidney disease: Secondary | ICD-10-CM | POA: Diagnosis not present

## 2021-12-14 DIAGNOSIS — D631 Anemia in chronic kidney disease: Secondary | ICD-10-CM | POA: Insufficient documentation

## 2021-12-14 DIAGNOSIS — Z9884 Bariatric surgery status: Secondary | ICD-10-CM | POA: Diagnosis not present

## 2021-12-14 DIAGNOSIS — D508 Other iron deficiency anemias: Secondary | ICD-10-CM

## 2021-12-14 DIAGNOSIS — K5730 Diverticulosis of large intestine without perforation or abscess without bleeding: Secondary | ICD-10-CM | POA: Insufficient documentation

## 2021-12-14 LAB — CBC WITH DIFFERENTIAL (CANCER CENTER ONLY)
Abs Immature Granulocytes: 0.01 10*3/uL (ref 0.00–0.07)
Basophils Absolute: 0 10*3/uL (ref 0.0–0.1)
Basophils Relative: 1 %
Eosinophils Absolute: 0.1 10*3/uL (ref 0.0–0.5)
Eosinophils Relative: 3 %
HCT: 31.2 % — ABNORMAL LOW (ref 36.0–46.0)
Hemoglobin: 9.2 g/dL — ABNORMAL LOW (ref 12.0–15.0)
Immature Granulocytes: 0 %
Lymphocytes Relative: 36 %
Lymphs Abs: 1.8 10*3/uL (ref 0.7–4.0)
MCH: 22.1 pg — ABNORMAL LOW (ref 26.0–34.0)
MCHC: 29.5 g/dL — ABNORMAL LOW (ref 30.0–36.0)
MCV: 75 fL — ABNORMAL LOW (ref 80.0–100.0)
Monocytes Absolute: 0.4 10*3/uL (ref 0.1–1.0)
Monocytes Relative: 8 %
Neutro Abs: 2.6 10*3/uL (ref 1.7–7.7)
Neutrophils Relative %: 52 %
Platelet Count: 221 10*3/uL (ref 150–400)
RBC: 4.16 MIL/uL (ref 3.87–5.11)
RDW: 17.8 % — ABNORMAL HIGH (ref 11.5–15.5)
WBC Count: 5 10*3/uL (ref 4.0–10.5)
nRBC: 0 % (ref 0.0–0.2)

## 2021-12-14 LAB — RETIC PANEL
Immature Retic Fract: 15 % (ref 2.3–15.9)
RBC.: 4.17 MIL/uL (ref 3.87–5.11)
Retic Count, Absolute: 70.9 10*3/uL (ref 19.0–186.0)
Retic Ct Pct: 1.7 % (ref 0.4–3.1)
Reticulocyte Hemoglobin: 24.8 pg — ABNORMAL LOW (ref >27.9)

## 2021-12-14 LAB — VITAMIN B12: Vitamin B-12: 1387 pg/mL — ABNORMAL HIGH (ref 180–914)

## 2021-12-14 LAB — FERRITIN: Ferritin: 272 ng/mL (ref 11–307)

## 2021-12-14 NOTE — Telephone Encounter (Signed)
Great sounds good. Thank you

## 2021-12-14 NOTE — Telephone Encounter (Signed)
Scheduled per 1/26 los, calender has been mailed to pt

## 2021-12-14 NOTE — Progress Notes (Signed)
[place] [phone]   Fax:([phone]) [place] NOTE  Patient Care Team: Michela Pitcher, NP as PCP - General (Pain Medicine) [name], MD as PCP - Cardiology (Cardiology) [name], MD as Consulting Physician (Cardiology)  Hematological/Oncological History #Microcytic Anemia #Anemia in Setting of CKD  -07/18/2020: Colonoscopy: one 5 mm polyp in descending colon. Diverticulosis in sigmoid colon and ascending colon.   -11/22/2020: WBC 4.5, Hgb 10.1 (L), MCV 69.6, Plt 235  -11/30/2020: Vitamin B12 186 (L), Retic Ct Pct 1.9%, Abs Retic 87,780 (H), Iron 62, Transferrin 219, iron saturation 20.2% (L), Ferritin 70.4, Folate 12.3. Started vitamin B12 2000 mcg per day.   -01/23/2021:EGD/EUS: Previous surgical intervention-query mini-gastric pouch found in the cardia. Submucosal nodule in the duodenum bulb removed via EMR. Pathology was consistent with polypoid dudoenal tissue showing nodular, mature adipose tissue within the submucosa, consistent with a lipoma. Negative for dysplasia or malignancy.  -03/29/2021: WBC 5.6, Hgb 9.2 (L), MCV 68.6 (L), Plt 251, Ferritin 56.6  -04/04/2021: Establish care with Dede Query PA-C  04/28/2021-05/26/2021: Received IV venofer x 5 doses.    CHIEF COMPLAINTS:  "Microcytic anemia"  HISTORY OF PRESENTING ILLNESS:  Alicia [name] 76 y.o. female returns for a follow up for microcytic anemia.  She was last seen on 09/13/2021 by Dede Query.  In the interim since her last visit she has had no major changes in her health.  On exam today Alicia [name] reports she has been "okay in the interim since her last visit.  She notes that she feels better than she did during her prior clinic visit.  She notes her energy is a 5 out of 10.  She continues to take her iron pills without any constipation or stomach upset.  She is also taking her vitamin B12 pills faithfully.  She notes that the Retacrit was stopped by  her kidney doctor and she has not had any further doses since October 2022.  She has she is had no other changes in her health other than a recent increase in her Lasix from 20 mg up to 40 mg p.o. daily.  She denies any bleeding, bruising, or dark stools.  Patient denies any fevers, chills, night sweats, shortness of breath, chest pain or cough. She has no other complaints. Rest of the 10 point ROS is below.   MEDICAL HISTORY:  Past Medical History:  Diagnosis Date   Anemia    Arthritis    "right knee" (10/21/2015)   CAD (coronary artery disease)    a. Cath 10/21/15: s/p DES to RCA: moderate diffuse stenosis of mod diagonal, diffuse irregularity LCx and LAD. 2D echo 10/21/15: mod LVH, EF 60-65%, no RWMA, calcified MV, mod-severe LAE. 03/18/17 PCI with DES--> 1 diag   Cataract    Diabetes ([place])    Essential hypertension    Family history of adverse reaction to anesthesia    "oldest sister, [name], related to brain formation at back of head; when they put her to sleep it's hard to wake her up"   GERD (gastroesophageal reflux disease)    History of blood transfusion    "related to ruptured tubal pregnancy"   Hyperlipidemia    Hypertensive heart disease    Morbid obesity ([place])    Stroke (cerebrum) ([place])    Tubular adenoma of colon    Type II diabetes mellitus ([place])     SURGICAL HISTORY: Past Surgical History:  Procedure Laterality Date   ABDOMINAL  HYSTERECTOMY  1988   fibroid   APPENDECTOMY  1985   BARIATRIC SURGERY  1984   BIOPSY  07/18/2020   Procedure: BIOPSY;  Surgeon: Beverley Fiedler, MD;  Location: [facility];  Service: Gastroenterology;;   CARDIAC CATHETERIZATION N/A 10/21/2015   Procedure: Right/Left Heart Cath and Coronary Angiography;  Surgeon: Tonny Bollman, MD;  Location: [facility] INVASIVE CV LAB;  Service: Cardiovascular;  Laterality: N/A;   CHOLECYSTECTOMY OPEN  1984   COLONOSCOPY WITH PROPOFOL N/A 07/18/2020   Procedure: COLONOSCOPY WITH PROPOFOL;  Surgeon: Beverley Fiedler, MD;   Location: [facility];  Service: Gastroenterology;  Laterality: N/A;   CORONARY STENT INTERVENTION N/A 03/18/2017   Procedure: Coronary Stent Intervention;  Surgeon: Marykay Lex, MD;  Location: [facility] INVASIVE CV LAB;  Service: Cardiovascular;  Laterality: N/A;   CORONARY STENT PLACEMENT  03/18/2017   A OPTIMIZE STUDY Drug Eluting Stent (2.5 mm 18 mm - post-dilated to 2.7 mm) was successfully placed   ECTOPIC PREGNANCY SURGERY     ENDOSCOPIC MUCOSAL RESECTION N/A 01/23/2021   Procedure: ENDOSCOPIC MUCOSAL RESECTION;  Surgeon: Lemar Lofty., MD;  Location: [facility];  Service: Gastroenterology;  Laterality: N/A;   ESOPHAGOGASTRODUODENOSCOPY (EGD) WITH PROPOFOL N/A 07/18/2020   Procedure: ESOPHAGOGASTRODUODENOSCOPY (EGD) WITH PROPOFOL;  Surgeon: Beverley Fiedler, MD;  Location: [facility];  Service: Gastroenterology;  Laterality: N/A;   ESOPHAGOGASTRODUODENOSCOPY (EGD) WITH PROPOFOL N/A 01/23/2021   Procedure: ESOPHAGOGASTRODUODENOSCOPY (EGD) WITH PROPOFOL;  Surgeon: Meridee Score Netty Starring., MD;  Location: [facility];  Service: Gastroenterology;  Laterality: N/A;   EUS N/A 01/23/2021   Procedure: UPPER ENDOSCOPIC ULTRASOUND (EUS) RADIAL;  Surgeon: Lemar Lofty., MD;  Location: [facility];  Service: Gastroenterology;  Laterality: N/A;   HEMOSTASIS CLIP PLACEMENT  01/23/2021   Procedure: HEMOSTASIS CLIP PLACEMENT;  Surgeon: Lemar Lofty., MD;  Location: [facility];  Service: Gastroenterology;;   INTRAVASCULAR PRESSURE WIRE/FFR STUDY N/A 09/04/2019   Procedure: INTRAVASCULAR PRESSURE WIRE/FFR STUDY;  Surgeon: Yvonne Kendall, MD;  Location: [facility] INVASIVE CV LAB;  Service: Cardiovascular;  Laterality: N/A;   LEFT HEART CATH AND CORONARY ANGIOGRAPHY N/A 03/18/2017   Procedure: Left Heart Cath and Coronary Angiography;  Surgeon: Marykay Lex, MD;  Location: [facility] INVASIVE CV LAB;  Service: Cardiovascular;  Laterality: N/A;   LEFT HEART CATH AND CORONARY ANGIOGRAPHY N/A  09/04/2019   Procedure: LEFT HEART CATH AND CORONARY ANGIOGRAPHY;  Surgeon: Yvonne Kendall, MD;  Location: [facility] INVASIVE CV LAB;  Service: Cardiovascular;  Laterality: N/A;   POLYPECTOMY  07/18/2020   Procedure: POLYPECTOMY;  Surgeon: Beverley Fiedler, MD;  Location: [facility];  Service: Gastroenterology;;   REDUCTION MAMMAPLASTY Bilateral ~ 1976   SUBMUCOSAL LIFTING INJECTION  01/23/2021   Procedure: SUBMUCOSAL LIFTING INJECTION;  Surgeon: Lemar Lofty., MD;  Location: [facility];  Service: Gastroenterology;;   TONSILLECTOMY      SOCIAL HISTORY: Social History   Socioeconomic History   Marital status: Divorced    Spouse name: Not on file   Number of children: 3   Years of education: doctorate   Highest education level: Not on file  Occupational History   Occupation: Teacher/retired  Tobacco Use   Smoking status: Never   Smokeless tobacco: Never  Vaping Use   Vaping Use: Never used  Substance and Sexual Activity   Alcohol use: No   Drug use: No   Sexual activity: Not Currently  Other Topics Concern   Not on file  Social History Narrative   Caffeine use-yes   Regular exercise-no  Social Determinants of Radio broadcast assistant Strain: Not on file  Food Insecurity: Not on file  Transportation Needs: Not on file  Physical Activity: Not on file  Stress: Not on file  Social Connections: Not on file  Intimate Partner Violence: Not on file    FAMILY HISTORY: Family History  Problem Relation Age of Onset   Diabetes Mother    Heart attack Mother 76       2007   Hypertension Mother    Hyperlipidemia Mother    Heart disease Mother    Heart attack Father    Stroke Father 79   Hypertension Father    Hyperlipidemia Father    Cancer Sister        Gastric cancer in 2013   Stomach cancer Sister        in the cavity behind the stomach   Diabetes Sister    Diabetes Sister    Diabetes Sister    Migraines Daughter        brain surgery   Migraines Daughter     Hypertension Son        weight loss corrected that   Breast cancer Neg Hx    Colon cancer Neg Hx    Inflammatory bowel disease Neg Hx    Liver disease Neg Hx    Esophageal cancer Neg Hx    Pancreatic cancer Neg Hx    Rectal cancer Neg Hx     ALLERGIES:  is allergic to iodinated contrast media and sulfa antibiotics.  MEDICATIONS:  Current Outpatient Medications  Medication Sig Dispense Refill   amLODipine (NORVASC) 10 MG tablet TAKE 1 TABLET BY MOUTH EVERY DAY (Patient taking differently: Take 10 mg by mouth daily.) 90 tablet 3   aspirin EC 81 MG tablet Take 1 tablet (81 mg total) by mouth daily. Swallow whole. 30 tablet 11   atorvastatin (LIPITOR) 80 MG tablet TAKE 1 TABLET BY MOUTH DAILY AT 6 PM. (Patient taking differently: Take 80 mg by mouth at bedtime.) 90 tablet 3   carvedilol (COREG) 25 MG tablet Take 2 tablets (50 mg total) by mouth 2 (two) times daily with a meal. Please make yearly appt with Dr. Harrington Challenger for January 2023 for future refills. Thank you 1st attempt 360 tablet 0   Cholecalciferol 50 MCG (2000 UT) TABS Take one tablet daily alternating with two tablets daily 30 tablet    clopidogrel (PLAVIX) 75 MG tablet Take 1 tablet (75 mg total) by mouth daily. 90 tablet 3   doxylamine, Sleep, (UNISOM) 25 MG tablet Take 25 mg by mouth at bedtime.     Evolocumab (REPATHA SURECLICK) 850 MG/ML SOAJ INJECT 1 PEN INTO THE SKIN EVERY 14 (FOURTEEN) DAYS. 2 mL 11   ferrous sulfate 325 (65 FE) MG tablet TAKE 1 TABLET BY MOUTH EVERY DAY WITH BREAKFAST (Patient taking differently: Take 325 mg by mouth daily with breakfast.) 90 tablet 0   furosemide (LASIX) 20 MG tablet TAKE 1 TABLET BY MOUTH EVERY DAY 90 tablet 2   glipiZIDE (GLUCOTROL) 10 MG tablet Take 1 tablet (10 mg total) by mouth 2 (two) times daily before a meal. 180 tablet 1   glucose blood (ONETOUCH ULTRA) test strip 1 each by Other route 2 (two) times daily. LAST REFILL SCHEDULE PHYSICAL 200 strip 0   isosorbide mononitrate (IMDUR)  60 MG 24 hr tablet Take 1 tablet (60 mg total) by mouth daily. Please make overdue appt with Dr. Harrington Challenger before anymore refills. Thank you 1st attempt  30 tablet 0   linagliptin (TRADJENTA) 5 MG TABS tablet TAKE 1 TABLET (5 MG TOTAL) BY MOUTH DAILY. (Patient taking differently: Take 5 mg by mouth daily.) 90 tablet 1   losartan (COZAAR) 100 MG tablet Take 1 tablet (100 mg total) by mouth daily. 90 tablet 1   losartan (COZAAR) 50 MG tablet Take 1 tablet (50 mg total) by mouth daily. 90 tablet 3   Magnesium 200 MG TABS Take 200 mg by mouth daily.     metFORMIN (GLUCOPHAGE) 1000 MG tablet Take 1 tablet (1,000 mg total) by mouth 2 (two) times daily with a meal. 180 tablet 1   nitroGLYCERIN (NITROSTAT) 0.4 MG SL tablet Place 1 tablet (0.4 mg total) under the tongue every 5 (five) minutes as needed for chest pain. 25 tablet 3   omeprazole (PRILOSEC) 40 MG capsule TAKE 1 CAPSULE BY MOUTH EVERY DAY 90 capsule 1   ondansetron (ZOFRAN-ODT) 4 MG disintegrating tablet TAKE 1 TABLET BY MOUTH EVERY 8 HOURS AS NEEDED FOR NAUSEA AND VOMITING (Patient taking differently: Take 4 mg by mouth every 8 (eight) hours as needed for vomiting or nausea.) 30 tablet 0   spironolactone (ALDACTONE) 25 MG tablet TAKE 1 TABLET BY MOUTH EVERY DAY 30 tablet 0   vitamin B-12 (CYANOCOBALAMIN) 500 MCG tablet Take 2 tablets (1,000 mcg total) by mouth 2 (two) times daily. 120 tablet 2   Vitamin D, Ergocalciferol, (DRISDOL) 1.25 MG (50000 UNIT) CAPS capsule Take 1 capsule (50,000 Units total) by mouth every 7 (seven) days. Please return for vit d level check on 02/10/21. 8 capsule 0   No current facility-administered medications for this visit.    REVIEW OF SYSTEMS:   Constitutional: ( - ) fevers, ( - )  chills , ( - ) night sweats Eyes: ( - ) blurriness of vision, ( - ) double vision, ( - ) watery eyes Ears, nose, mouth, throat, and face: ( - ) mucositis, ( - ) sore throat Respiratory: ( - ) cough, ( - ) dyspnea, ( - )  wheezes Cardiovascular: ( - ) palpitation, ( - ) chest discomfort, ( + ) lower extremity swelling Gastrointestinal:  ( - ) nausea, ( - ) heartburn, ( - ) change in bowel habits Skin: ( - ) abnormal skin rashes Lymphatics: ( - ) new lymphadenopathy, ( - ) easy bruising Neurological: ( - ) numbness, ( - ) tingling, ( - ) new weaknesses Behavioral/Psych: ( - ) mood change, ( - ) new changes  All other systems were reviewed with the patient and are negative.  PHYSICAL EXAMINATION: ECOG PERFORMANCE STATUS: 1 - Symptomatic but completely ambulatory  Vitals:   12/14/21 1052  BP: (!) 157/70  Pulse: 82  Resp: 20  Temp: 98.2 F (36.8 C)  SpO2: 100%    Filed Weights   12/14/21 1052  Weight: (!) 307 lb 1.6 oz (139.3 kg)     GENERAL: well appearing African American female in NAD, obese SKIN: skin color, texture, turgor are normal, no rashes or significant lesions EYES: conjunctiva are pink and non-injected, sclera clear LUNGS: clear to auscultation and percussion with normal breathing effort HEART: regular rate & rhythm and no murmurs and no lower extremity edema Musculoskeletal: no cyanosis of digits and no clubbing  PSYCH: alert & oriented x 3, fluent speech NEURO: no focal motor/sensory deficits  LABORATORY DATA:  I have reviewed the data as listed CBC Latest Ref Rng & Units 12/14/2021 11/15/2021 11/08/2021  WBC 4.0 - 10.5 K/uL  5.0 6.0 4.3  Hemoglobin 12.0 - 15.0 g/dL 9.2(L) 9.6(L) 10.0(L)  Hematocrit 36.0 - 46.0 % 31.2(L) 31.0(L) 33.6(L)  Platelets 150 - 400 K/uL 221 249.0 232    CMP Latest Ref Rng & Units 11/17/2021 11/15/2021 11/08/2021  Glucose 70 - 99 mg/dL 80 73 176(H)  BUN 6 - 23 mg/dL 39(H) 38(H) 40(H)  Creatinine 0.40 - 1.20 mg/dL 2.47(H) 2.42(H) 2.74(H)  Sodium 135 - 145 mEq/L 139 137 133(L)  Potassium 3.5 - 5.1 mEq/L 5.3 No hemolysis seen(H) 5.4 No hemolysis seen(H) 4.7  Chloride 96 - 112 mEq/L 105 103 102  CO2 19 - 32 mEq/L 27 26 24   Calcium 8.4 - 10.5 mg/dL 8.9  8.9 8.5(L)  Total Protein 6.0 - 8.3 g/dL - 6.4 6.5  Total Bilirubin 0.2 - 1.2 mg/dL - 0.3 0.8  Alkaline Phos 39 - 117 U/L - 84 93  AST 0 - 37 U/L - 13 14(L)  ALT 0 - 35 U/L - 7 12   ASSESSMENT & PLAN Alicia [name] is a 76 y.o. female presenting to the clinic for follow up of microcytic anemia.   #Microcytic Anemia #Anemia in Setting of CKD  --Multifactorial in the setting of CKD, malabsorption of iron and B12 due to history of gastric bypass.  --previously receiving Retacrit 20,000 Units every 2 weeks, last dose on 08/30/2021. Defer restarting to nephrology. Hgb target with retacrit is 10.  --Patient denies any signs of bleeding. She underwent colonoscopy in August 2021 and EGD in March 2022 without any evidence of active or recent bleeding.  --Patient received IV venofer once weekly x 5 doses from 04/28/2021-05/26/2021. --Labs today show Hgb 9.2, iron studies pending.  --Currently on ferrous sulfate 325 mg daily and vitamin B12 1000 mcg twice daily. Recommend to continue.  --No indication for bone marrow biopsy at this time.  --RTC  in 3 months with labs.   No orders of the defined types were placed in this encounter.   All questions were answered. The patient knows to call the clinic with any problems, questions or concerns.  I have spent a total of 30 minutes minutes of face-to-face and non-face-to-face time, preparing to see the patient, performing a medically appropriate examination, counseling and educating the patient, ordering tests, documenting clinical information in the electronic health record, and care coordination.   Ledell Peoples, MD Department of Hematology/Oncology [place] [facility] Phone: [phone] Pager: [phone] Email: [email or web address]

## 2021-12-14 NOTE — Telephone Encounter (Signed)
Got a note from Heme that states her lasix has changed from 20mg  to 40 mg daily. I do not see a note in the chart about that. I had also spoken to the patient about her labs back in December. I had wanted her to take 40mg  of lasix for a few days and then go back to normal. Need to find out if Dr. Harrington Challenger changed her script. Also she mentioned that she had seen the nephrologist after I spoke to her but have not seen a note.   If she has been taking the 40mg  lasix every day because of a misunderstanding to me she needs to go back to how it is prescribed and have her kidneys rechecked.  Thanks, Romilda Garret, DNP, AGNP-C

## 2021-12-14 NOTE — Telephone Encounter (Signed)
Patient states her nephrologist changed Lasix to 40 mg for one month only, she will be going back to 20 mg daily next month. I called [place] office and asked for the last 2 office notes to be faxed over to [place].

## 2021-12-25 ENCOUNTER — Telehealth: Payer: Self-pay | Admitting: Nurse Practitioner

## 2021-12-25 ENCOUNTER — Encounter: Payer: Self-pay | Admitting: Nurse Practitioner

## 2021-12-25 ENCOUNTER — Other Ambulatory Visit: Payer: Self-pay

## 2021-12-25 ENCOUNTER — Ambulatory Visit: Payer: Medicare PPO | Admitting: Nurse Practitioner

## 2021-12-25 VITALS — BP 130/72 | HR 80 | Temp 98.2°F | Resp 14 | Ht 65.0 in | Wt 307.1 lb

## 2021-12-25 DIAGNOSIS — R600 Localized edema: Secondary | ICD-10-CM

## 2021-12-25 NOTE — Telephone Encounter (Signed)
Noted. Will see her in office

## 2021-12-25 NOTE — Telephone Encounter (Signed)
Pt called stating that Alicia [name] wanted her to let him know how she was doing from the visit with him on 11/15/21. Pt states that her right inner thigh has weakness and pain. Please advise.

## 2021-12-25 NOTE — Telephone Encounter (Signed)
Spoke with patient. Patient states the last week she has felt more fatigue/weak. She does have an appointment to follow up with nephrologist tomorrow 12/26/21. Yesterday she drove for 2 hours to visit a friend, she has not drove for the last 2 years or so, after that she developed a shooting pain in her right leg/thigh area that lasted a few minutes. Last night patient had trouble sleeping last night due to the pain. She thought it was due to the long drive but is not sure now. Has had some pain today also. Scheduled patient to be seen today by [facility] to evaluate.

## 2021-12-25 NOTE — Assessment & Plan Note (Signed)
Patient had work-up for DVT in the past that was negative.  Given size difference and posterior right calf tenderness will order stat vascular ultrasound right lower extremity to rule out DVT.  Patient has an appointment 8:00 in the morning.  We will check basic blood work to make sure is not electrolyte related.  Patient also has an appoint with nephrology tomorrow as her Lasix has been increased to 40 mg daily versus 20 mg daily.  Pending results  Stay aspirin did help with pain recommend her to take Tylenol as needed for discomfort need to avoid NSAIDs due to renal function

## 2021-12-25 NOTE — Progress Notes (Signed)
Acute Office Visit  Subjective:    Patient ID: Alicia [name], female    DOB: September 08, 1946, 76 y.o.   MRN: [id number]  Chief Complaint  Patient presents with   Right leg pain    Started on 12/24/21, starts in the lower right leg and goes up. Lasted the first time for 2 1/2 hours and could not stand up due to the pain. This started after driving a car for 2 hours on 12/24/21. Pain was present off and on all night.     HPI Patient is in today for Right leg pain  Started yesterday. States that she did drive for a couple hours (2 hours). After she drove it started. States that it is intermittemnet. States it hurt some this morning shooting pain pain goes from bottom up.  No History of clots.  Patient did try some low dose asa she took 3 and helped.   Cough: states that her brother lives with her. States that she has had a dry cough for a couple of months. States that it is worse with her brother is smoking around her.   Past Medical History:  Diagnosis Date   Anemia    Arthritis    "right knee" (10/21/2015)   CAD (coronary artery disease)    a. Cath 10/21/15: s/p DES to RCA: moderate diffuse stenosis of mod diagonal, diffuse irregularity LCx and LAD. 2D echo 10/21/15: mod LVH, EF 60-65%, no RWMA, calcified MV, mod-severe LAE. 03/18/17 PCI with DES--> 1 diag   Cataract    Diabetes ([place])    Essential hypertension    Family history of adverse reaction to anesthesia    "oldest sister, [name], related to brain formation at back of head; when they put her to sleep it's hard to wake her up"   GERD (gastroesophageal reflux disease)    History of blood transfusion    "related to ruptured tubal pregnancy"   Hyperlipidemia    Hypertensive heart disease    Morbid obesity ([place])    Stroke (cerebrum) ([place])    Tubular adenoma of colon    Type II diabetes mellitus ([place])     Past Surgical History:  Procedure Laterality Date   [place]   BIOPSY  07/18/2020   Procedure: BIOPSY;  Surgeon: [name], MD;  Location: [facility];  Service: Gastroenterology;;   CARDIAC CATHETERIZATION N/A 10/21/2015   Procedure: Right/Left Heart Cath and Coronary Angiography;  Surgeon: [name], MD;  Location: [place] [facility];  Service: Cardiovascular;  Laterality: N/A;   CHOLECYSTECTOMY OPEN  1984   COLONOSCOPY WITH PROPOFOL N/A 07/18/2020   Procedure: COLONOSCOPY WITH PROPOFOL;  Surgeon: [name], MD;  [facility]: [facility];  Service: Gastroenterology;  Laterality: N/A;   CORONARY STENT INTERVENTION N/A 03/18/2017   Procedure: Coronary Stent Intervention;  Surgeon: [name], MD;  Location: [place] [facility];  Service: Cardiovascular;  Laterality: N/A;   CORONARY STENT PLACEMENT  03/18/2017   A OPTIMIZE STUDY Drug Eluting Stent (2.5 mm 18 mm - post-dilated to 2.7 mm) was successfully placed   ECTOPIC PREGNANCY SURGERY     ENDOSCOPIC MUCOSAL RESECTION N/A 01/23/2021   Procedure: ENDOSCOPIC MUCOSAL RESECTION;  Surgeon: Irving Copas., MD;  Location: [facility];  Service: Gastroenterology;  Laterality: N/A;   ESOPHAGOGASTRODUODENOSCOPY (EGD) WITH PROPOFOL N/A 07/18/2020   Procedure: ESOPHAGOGASTRODUODENOSCOPY (EGD) WITH PROPOFOL;  Surgeon: [name],  MD;  [facility];  Service: Gastroenterology;  Laterality: N/A;   ESOPHAGOGASTRODUODENOSCOPY (EGD) WITH PROPOFOL N/A 01/23/2021   Procedure: ESOPHAGOGASTRODUODENOSCOPY (EGD) WITH PROPOFOL;  Surgeon: Rush Landmark Telford Nab., MD;  [facility];  Service: Gastroenterology;  Laterality: N/A;   EUS N/A 01/23/2021   Procedure: UPPER ENDOSCOPIC ULTRASOUND (EUS) RADIAL;  Surgeon: Irving Copas., MD;  [facility];  Service: Gastroenterology;  Laterality: N/A;   HEMOSTASIS CLIP PLACEMENT  01/23/2021   Procedure: HEMOSTASIS CLIP PLACEMENT;  Surgeon: Irving Copas., MD;  Location: [facility];  Service: Gastroenterology;;    INTRAVASCULAR PRESSURE WIRE/FFR STUDY N/A 09/04/2019   Procedure: INTRAVASCULAR PRESSURE WIRE/FFR STUDY;  Surgeon: Nelva Bush, MD;  Location: [place] [facility];  Service: Cardiovascular;  Laterality: N/A;   LEFT HEART CATH AND CORONARY ANGIOGRAPHY N/A 03/18/2017   Procedure: Left Heart Cath and Coronary Angiography;  Surgeon: [name], MD;  Location: [facility];  Service: Cardiovascular;  Laterality: N/A;   LEFT HEART CATH AND CORONARY ANGIOGRAPHY N/A 09/04/2019   Procedure: LEFT HEART CATH AND CORONARY ANGIOGRAPHY;  Surgeon: Nelva Bush, MD;  Location: [place] [facility];  Service: Cardiovascular;  Laterality: N/A;   POLYPECTOMY  07/18/2020   Procedure: POLYPECTOMY;  Surgeon: [name], MD;  [facility];  Service: Gastroenterology;;   REDUCTION MAMMAPLASTY Bilateral ~ [place] INJECTION  01/23/2021   Procedure: SUBMUCOSAL LIFTING INJECTION;  Surgeon: Irving Copas., MD;  Location: [facility];  Service: Gastroenterology;;   TONSILLECTOMY      Family History  Problem Relation Age of Onset   Diabetes Mother    Heart attack Mother 76       2007   Hypertension Mother    Hyperlipidemia Mother    Heart disease Mother    Heart attack Father    Stroke Father 36   Hypertension Father    Hyperlipidemia Father    Cancer Sister        Gastric cancer in 2013   Stomach cancer Sister        in the cavity behind the stomach   Diabetes Sister    Diabetes Sister    Diabetes Sister    Migraines Daughter        brain surgery   Migraines Daughter    Hypertension Son        weight loss corrected that   Breast cancer Neg Hx    Colon cancer Neg Hx    Inflammatory bowel disease Neg Hx    Liver disease Neg Hx    Esophageal cancer Neg Hx    Pancreatic cancer Neg Hx    Rectal cancer Neg Hx     Social History   Socioeconomic History   Marital status: Divorced    Spouse name: Not on file   Number of children: 3   Years of  education: doctorate   Highest education level: Not on file  Occupational History   Occupation: Teacher/retired  Tobacco Use   Smoking status: Never   Smokeless tobacco: Never  Vaping Use   Vaping Use: Never used  Substance and Sexual Activity   Alcohol use: No   Drug use: No   Sexual activity: Not Currently  Other Topics Concern   Not on file  Social History Narrative   Caffeine use-yes   Regular exercise-no   Social Determinants of Health   Financial Resource Strain: Not on file  Food Insecurity: Not on file  Transportation Needs: Not on file  Physical Activity: Not on file  Stress: Not on file  Social Connections: Not on file  Intimate Partner Violence: Not on file    Outpatient Medications Prior to Visit  Medication Sig Dispense Refill   amLODipine (NORVASC) 10 MG tablet TAKE 1 TABLET BY MOUTH EVERY DAY (Patient taking differently: Take 10 mg by mouth daily.) 90 tablet 3   aspirin EC 81 MG tablet Take 1 tablet (81 mg total) by mouth daily. Swallow whole. 30 tablet 11   atorvastatin (LIPITOR) 80 MG tablet TAKE 1 TABLET BY MOUTH DAILY AT 6 PM. (Patient taking differently: Take 80 mg by mouth at bedtime.) 90 tablet 3   carvedilol (COREG) 25 MG tablet Take 2 tablets (50 mg total) by mouth 2 (two) times daily with a meal. Please make yearly appt with Dr. Harrington Challenger for January 2023 for future refills. Thank you 1st attempt 360 tablet 0   Cholecalciferol 50 MCG (2000 UT) TABS Take one tablet daily alternating with two tablets daily 30 tablet    clopidogrel (PLAVIX) 75 MG tablet Take 1 tablet (75 mg total) by mouth daily. 90 tablet 3   doxylamine, Sleep, (UNISOM) 25 MG tablet Take 25 mg by mouth at bedtime.     Evolocumab (REPATHA SURECLICK) 654 MG/ML SOAJ INJECT 1 PEN INTO THE SKIN EVERY 14 (FOURTEEN) DAYS. 2 mL 11   ferrous sulfate 325 (65 FE) MG tablet TAKE 1 TABLET BY MOUTH EVERY DAY WITH BREAKFAST (Patient taking differently: Take 325 mg by mouth daily with breakfast.) 90 tablet 0    furosemide (LASIX) 20 MG tablet TAKE 1 TABLET BY MOUTH EVERY DAY 90 tablet 2   glipiZIDE (GLUCOTROL) 10 MG tablet Take 1 tablet (10 mg total) by mouth 2 (two) times daily before a meal. 180 tablet 1   glucose blood (ONETOUCH ULTRA) test strip 1 each by Other route 2 (two) times daily. LAST REFILL SCHEDULE PHYSICAL 200 strip 0   isosorbide mononitrate (IMDUR) 60 MG 24 hr tablet Take 1 tablet (60 mg total) by mouth daily. Please make overdue appt with Dr. Harrington Challenger before anymore refills. Thank you 1st attempt 30 tablet 0   linagliptin (TRADJENTA) 5 MG TABS tablet TAKE 1 TABLET (5 MG TOTAL) BY MOUTH DAILY. (Patient taking differently: Take 5 mg by mouth daily.) 90 tablet 1   losartan (COZAAR) 100 MG tablet Take 1 tablet (100 mg total) by mouth daily. 90 tablet 1   losartan (COZAAR) 50 MG tablet Take 1 tablet (50 mg total) by mouth daily. 90 tablet 3   Magnesium 200 MG TABS Take 200 mg by mouth daily.     metFORMIN (GLUCOPHAGE) 1000 MG tablet Take 1 tablet (1,000 mg total) by mouth 2 (two) times daily with a meal. 180 tablet 1   nitroGLYCERIN (NITROSTAT) 0.4 MG SL tablet Place 1 tablet (0.4 mg total) under the tongue every 5 (five) minutes as needed for chest pain. 25 tablet 3   omeprazole (PRILOSEC) 40 MG capsule TAKE 1 CAPSULE BY MOUTH EVERY DAY 90 capsule 1   ondansetron (ZOFRAN-ODT) 4 MG disintegrating tablet TAKE 1 TABLET BY MOUTH EVERY 8 HOURS AS NEEDED FOR NAUSEA AND VOMITING (Patient taking differently: Take 4 mg by mouth every 8 (eight) hours as needed for vomiting or nausea.) 30 tablet 0   spironolactone (ALDACTONE) 25 MG tablet TAKE 1 TABLET BY MOUTH EVERY DAY 30 tablet 0   vitamin B-12 (CYANOCOBALAMIN) 500 MCG tablet Take 2 tablets (1,000 mcg total) by mouth 2 (two) times daily. 120 tablet  2   Vitamin D, Ergocalciferol, (DRISDOL) 1.25 MG (50000 UNIT) CAPS capsule Take 1 capsule (50,000 Units total) by mouth every 7 (seven) days. Please return for vit d level check on 02/10/21. 8 capsule 0   No  facility-administered medications prior to visit.    Allergies  Allergen Reactions   Iodinated Contrast Media Hives   Sulfa Antibiotics Hives    Review of Systems  Constitutional:  Negative for diaphoresis and fever.  Respiratory:  Positive for cough (dry). Negative for shortness of breath.   Cardiovascular:  Positive for leg swelling. Negative for chest pain.  Gastrointestinal:  Negative for diarrhea, nausea and vomiting.  Genitourinary:  Negative for difficulty urinating.      Objective:    Physical Exam Vitals and nursing note reviewed.  Constitutional:      Appearance: She is obese.  Cardiovascular:     Rate and Rhythm: Normal rate and regular rhythm.     Pulses: Normal pulses.     Heart sounds: Normal heart sounds.  Pulmonary:     Effort: Pulmonary effort is normal.     Breath sounds: Normal breath sounds.  Abdominal:     General: Bowel sounds are normal.  Musculoskeletal:     Lumbar back: Negative right straight leg raise test and negative left straight leg raise test.     Right lower leg: Edema present.     Left lower leg: Edema present.  Skin:    General: Skin is warm.     Comments: 47CM on right lower extremity 44CM on left lower extremity  Tenderness along right posterior calf muscle  Neurological:     Mental Status: She is alert.    BP 130/72    Pulse 80    Temp 98.2 F (36.8 C)    Resp 14    Ht 5\' 5"  (1.651 m)    Wt (!) 307 lb 2 oz (139.3 kg)    SpO2 97%    BMI 51.11 kg/m  Wt Readings from Last 3 Encounters:  12/25/21 (!) 307 lb 2 oz (139.3 kg)  12/14/21 (!) 307 lb 1.6 oz (139.3 kg)  11/15/21 (!) 315 lb 2 oz (142.9 kg)    Health Maintenance Due  Topic Date Due   Zoster Vaccines- Shingrix (1 of 2) Never done   DEXA SCAN  Never done   COVID-19 Vaccine (4 - Booster for Pfizer series) 11/15/2020    There are no preventive care reminders to display for this patient.   Lab Results  Component Value Date   TSH 1.12 11/06/2021   Lab Results   Component Value Date   WBC 5.0 12/14/2021   HGB 9.2 (L) 12/14/2021   HCT 31.2 (L) 12/14/2021   MCV 75.0 (L) 12/14/2021   PLT 221 12/14/2021   Lab Results  Component Value Date   NA 139 11/17/2021   K 5.3 No hemolysis seen (H) 11/17/2021   CO2 27 11/17/2021   GLUCOSE 80 11/17/2021   BUN 39 (H) 11/17/2021   CREATININE 2.47 (H) 11/17/2021   BILITOT 0.3 11/15/2021   ALKPHOS 84 11/15/2021   AST 13 11/15/2021   ALT 7 11/15/2021   PROT 6.4 11/15/2021   ALBUMIN 3.7 11/15/2021   CALCIUM 8.9 11/17/2021   ANIONGAP 7 11/08/2021   GFR 18.80 (L) 11/17/2021   Lab Results  Component Value Date   CHOL 169 11/06/2021   Lab Results  Component Value Date   HDL 47.50 11/06/2021   Lab Results  Component Value Date  LDLCALC 104 (H) 11/06/2021   Lab Results  Component Value Date   TRIG 91.0 11/06/2021   Lab Results  Component Value Date   CHOLHDL 4 11/06/2021   Lab Results  Component Value Date   HGBA1C 7.0 (A) 11/06/2021       Assessment & Plan:   Problem List Items Addressed This Visit       Other   Lower extremity edema - Primary    Patient had work-up for DVT in the past that was negative.  Given size difference and posterior right calf tenderness will order stat vascular ultrasound right lower extremity to rule out DVT.  Patient has an appointment 8:00 in the morning.  We will check basic blood work to make sure is not electrolyte related.  Patient also has an appoint with nephrology tomorrow as her Lasix has been increased to 40 mg daily versus 20 mg daily.  Pending results  Stay aspirin did help with pain recommend her to take Tylenol as needed for discomfort need to avoid NSAIDs due to renal function      Relevant Orders   VAS [place] LOWER EXTREMITY VENOUS (DVT)   Basic metabolic panel   CBC     No orders of the defined types were placed in this encounter.  This visit occurred during the SARS-CoV-2 public health emergency.  Safety protocols were in place,  including screening questions prior to the visit, additional usage of staff PPE, and extensive cleaning of exam room while observing appropriate contact time as indicated for disinfecting solutions.    Romilda Garret, NP

## 2021-12-25 NOTE — Patient Instructions (Signed)
Nice to see you today Will be in touch with the US findings once I have them Will check electrolytes today to make sure it is not related to  Address: [address], [place], [facility], Entrance C towards the back of the hospital. Tomorrow 12/26/21 at 8 am, arrive 10 minutes prior. You will be going to Vascular Lab. Ask for the vascular lab

## 2021-12-26 ENCOUNTER — Ambulatory Visit (HOSPITAL_COMMUNITY)
Admission: RE | Admit: 2021-12-26 | Discharge: 2021-12-26 | Disposition: A | Discharge status: Home / Self Care | Payer: Medicare PPO | Source: Ambulatory Visit | Attending: Nurse Practitioner | Admitting: Nurse Practitioner

## 2021-12-26 DIAGNOSIS — R600 Localized edema: Secondary | ICD-10-CM | POA: Diagnosis not present

## 2021-12-26 DIAGNOSIS — R200 Anesthesia of skin: Secondary | ICD-10-CM | POA: Diagnosis not present

## 2021-12-26 DIAGNOSIS — D631 Anemia in chronic kidney disease: Secondary | ICD-10-CM | POA: Diagnosis not present

## 2021-12-26 DIAGNOSIS — I129 Hypertensive chronic kidney disease with stage 1 through stage 4 chronic kidney disease, or unspecified chronic kidney disease: Secondary | ICD-10-CM | POA: Diagnosis not present

## 2021-12-26 DIAGNOSIS — I2510 Atherosclerotic heart disease of native coronary artery without angina pectoris: Secondary | ICD-10-CM | POA: Diagnosis not present

## 2021-12-26 DIAGNOSIS — E785 Hyperlipidemia, unspecified: Secondary | ICD-10-CM | POA: Diagnosis not present

## 2021-12-26 DIAGNOSIS — N183 Chronic kidney disease, stage 3 unspecified: Secondary | ICD-10-CM | POA: Diagnosis not present

## 2021-12-26 DIAGNOSIS — E1122 Type 2 diabetes mellitus with diabetic chronic kidney disease: Secondary | ICD-10-CM | POA: Diagnosis not present

## 2021-12-26 LAB — BASIC METABOLIC PANEL
BUN: 33 mg/dL — ABNORMAL HIGH (ref 6–23)
CO2: 28 mEq/L (ref 19–32)
Calcium: 9.2 mg/dL (ref 8.4–10.5)
Chloride: 106 mEq/L (ref 96–112)
Creatinine, Ser: 2.33 mg/dL — ABNORMAL HIGH (ref 0.40–1.20)
GFR: 20.15 mL/min — ABNORMAL LOW (ref >60.00)
Glucose, Bld: 114 mg/dL — ABNORMAL HIGH (ref 70–99)
Potassium: 4.9 mEq/L (ref 3.5–5.1)
Sodium: 141 mEq/L (ref 135–145)

## 2021-12-26 LAB — CBC
HCT: 30.7 % — ABNORMAL LOW (ref 36.0–46.0)
Hemoglobin: 9.5 g/dL — ABNORMAL LOW (ref 12.0–15.0)
MCHC: 30.9 g/dL (ref 30.0–36.0)
MCV: 71.5 fl — ABNORMAL LOW (ref 78.0–100.0)
Platelets: 210 10*3/uL (ref 150.0–400.0)
RBC: 4.29 Mil/uL (ref 3.87–5.11)
RDW: 18.1 % — ABNORMAL HIGH (ref 11.5–15.5)
WBC: 4.9 10*3/uL (ref 4.0–10.5)

## 2021-12-26 NOTE — Progress Notes (Signed)
RLE venous duplex has been completed.  Preliminary results called to Karl Ito, NP.   Results can be found under chart review under CV PROC. 12/26/2021 8:51 AM Marianita Botkin RVT, RDMS

## 2021-12-31 ENCOUNTER — Other Ambulatory Visit: Payer: Self-pay | Admitting: Internal Medicine

## 2021-12-31 DIAGNOSIS — I10 Essential (primary) hypertension: Secondary | ICD-10-CM

## 2022-01-24 ENCOUNTER — Other Ambulatory Visit: Payer: Self-pay | Admitting: Family Medicine

## 2022-01-24 ENCOUNTER — Other Ambulatory Visit: Payer: Self-pay | Admitting: Internal Medicine

## 2022-01-25 NOTE — Telephone Encounter (Signed)
Requested medication (s) are due for refill today: yes ? ?Requested medication (s) are on the active medication list: no ? ?Last refill:  01/30/21 ? ?Future visit scheduled: no ? ?Notes to clinic:  med was dc'd on 03/29/21 ? ? ?  ?Requested Prescriptions  ?Pending Prescriptions Disp Refills  ? JANUVIA 100 MG tablet [Pharmacy Med Name: JANUVIA 100 MG TABLET] 90 tablet 0  ?  Sig: TAKE 1 TABLET BY MOUTH EVERY DAY  ?  ? There is no refill protocol information for this order  ?  ? ?

## 2022-02-02 ENCOUNTER — Emergency Department (HOSPITAL_COMMUNITY): Payer: Medicare PPO

## 2022-02-02 ENCOUNTER — Other Ambulatory Visit: Payer: Self-pay

## 2022-02-02 ENCOUNTER — Emergency Department (HOSPITAL_COMMUNITY)
Admission: EM | Admit: 2022-02-02 | Discharge: 2022-02-02 | Disposition: A | Discharge status: Home / Self Care | Payer: Medicare PPO | Attending: Emergency Medicine | Admitting: Emergency Medicine

## 2022-02-02 ENCOUNTER — Telehealth: Payer: Self-pay

## 2022-02-02 DIAGNOSIS — Z7984 Long term (current) use of oral hypoglycemic drugs: Secondary | ICD-10-CM | POA: Diagnosis not present

## 2022-02-02 DIAGNOSIS — Z8673 Personal history of transient ischemic attack (TIA), and cerebral infarction without residual deficits: Secondary | ICD-10-CM | POA: Insufficient documentation

## 2022-02-02 DIAGNOSIS — R269 Unspecified abnormalities of gait and mobility: Secondary | ICD-10-CM | POA: Insufficient documentation

## 2022-02-02 DIAGNOSIS — Z79899 Other long term (current) drug therapy: Secondary | ICD-10-CM | POA: Insufficient documentation

## 2022-02-02 DIAGNOSIS — E119 Type 2 diabetes mellitus without complications: Secondary | ICD-10-CM | POA: Insufficient documentation

## 2022-02-02 DIAGNOSIS — R262 Difficulty in walking, not elsewhere classified: Secondary | ICD-10-CM

## 2022-02-02 DIAGNOSIS — I639 Cerebral infarction, unspecified: Secondary | ICD-10-CM | POA: Diagnosis not present

## 2022-02-02 DIAGNOSIS — Z7902 Long term (current) use of antithrombotics/antiplatelets: Secondary | ICD-10-CM | POA: Insufficient documentation

## 2022-02-02 DIAGNOSIS — R600 Localized edema: Secondary | ICD-10-CM | POA: Insufficient documentation

## 2022-02-02 DIAGNOSIS — R2689 Other abnormalities of gait and mobility: Secondary | ICD-10-CM | POA: Diagnosis not present

## 2022-02-02 DIAGNOSIS — Z7982 Long term (current) use of aspirin: Secondary | ICD-10-CM | POA: Insufficient documentation

## 2022-02-02 DIAGNOSIS — Z20822 Contact with and (suspected) exposure to covid-19: Secondary | ICD-10-CM | POA: Diagnosis not present

## 2022-02-02 DIAGNOSIS — I10 Essential (primary) hypertension: Secondary | ICD-10-CM | POA: Insufficient documentation

## 2022-02-02 DIAGNOSIS — I6782 Cerebral ischemia: Secondary | ICD-10-CM | POA: Diagnosis not present

## 2022-02-02 DIAGNOSIS — R29898 Other symptoms and signs involving the musculoskeletal system: Secondary | ICD-10-CM | POA: Diagnosis not present

## 2022-02-02 DIAGNOSIS — F801 Expressive language disorder: Secondary | ICD-10-CM | POA: Diagnosis present

## 2022-02-02 DIAGNOSIS — M6281 Muscle weakness (generalized): Secondary | ICD-10-CM | POA: Insufficient documentation

## 2022-02-02 DIAGNOSIS — R202 Paresthesia of skin: Secondary | ICD-10-CM | POA: Diagnosis not present

## 2022-02-02 DIAGNOSIS — R29818 Other symptoms and signs involving the nervous system: Secondary | ICD-10-CM | POA: Diagnosis not present

## 2022-02-02 DIAGNOSIS — R4701 Aphasia: Secondary | ICD-10-CM | POA: Diagnosis not present

## 2022-02-02 DIAGNOSIS — R531 Weakness: Secondary | ICD-10-CM | POA: Diagnosis not present

## 2022-02-02 DIAGNOSIS — R9431 Abnormal electrocardiogram [ECG] [EKG]: Secondary | ICD-10-CM | POA: Diagnosis not present

## 2022-02-02 LAB — COMPREHENSIVE METABOLIC PANEL
ALT: 10 U/L (ref 0–44)
AST: 13 U/L — ABNORMAL LOW (ref 15–41)
Albumin: 3.7 g/dL (ref 3.5–5.0)
Alkaline Phosphatase: 70 U/L (ref 38–126)
Anion gap: 9 (ref 5–15)
BUN: 35 mg/dL — ABNORMAL HIGH (ref 8–23)
CO2: 26 mmol/L (ref 22–32)
Calcium: 8.8 mg/dL — ABNORMAL LOW (ref 8.9–10.3)
Chloride: 103 mmol/L (ref 98–111)
Creatinine, Ser: 2.56 mg/dL — ABNORMAL HIGH (ref 0.44–1.00)
GFR, Estimated: 19 mL/min — ABNORMAL LOW (ref >60)
Glucose, Bld: 71 mg/dL (ref 70–99)
Potassium: 4 mmol/L (ref 3.5–5.1)
Sodium: 138 mmol/L (ref 135–145)
Total Bilirubin: 0.6 mg/dL (ref 0.3–1.2)
Total Protein: 6.3 g/dL — ABNORMAL LOW (ref 6.5–8.1)

## 2022-02-02 LAB — CBC
HCT: 32 % — ABNORMAL LOW (ref 36.0–46.0)
Hemoglobin: 9.5 g/dL — ABNORMAL LOW (ref 12.0–15.0)
MCH: 22.9 pg — ABNORMAL LOW (ref 26.0–34.0)
MCHC: 29.7 g/dL — ABNORMAL LOW (ref 30.0–36.0)
MCV: 77.3 fL — ABNORMAL LOW (ref 80.0–100.0)
Platelets: 216 10*3/uL (ref 150–400)
RBC: 4.14 MIL/uL (ref 3.87–5.11)
RDW: 15.9 % — ABNORMAL HIGH (ref 11.5–15.5)
WBC: 4.5 10*3/uL (ref 4.0–10.5)
nRBC: 0 % (ref 0.0–0.2)

## 2022-02-02 LAB — URINALYSIS, ROUTINE W REFLEX MICROSCOPIC
Bilirubin Urine: NEGATIVE
Glucose, UA: NEGATIVE mg/dL
Ketones, ur: NEGATIVE mg/dL
Leukocytes,Ua: NEGATIVE
Nitrite: NEGATIVE
Protein, ur: NEGATIVE mg/dL
Specific Gravity, Urine: 1.006 (ref 1.005–1.030)
pH: 7 (ref 5.0–8.0)

## 2022-02-02 LAB — RESP PANEL BY RT-PCR (FLU A&B, COVID) ARPGX2
Influenza A by PCR: NEGATIVE
Influenza B by PCR: NEGATIVE
SARS Coronavirus 2 by RT PCR: NEGATIVE

## 2022-02-02 LAB — I-STAT CHEM 8, ED
BUN: 34 mg/dL — ABNORMAL HIGH (ref 8–23)
Calcium, Ion: 1.14 mmol/L — ABNORMAL LOW (ref 1.15–1.40)
Chloride: 103 mmol/L (ref 98–111)
Creatinine, Ser: 2.7 mg/dL — ABNORMAL HIGH (ref 0.44–1.00)
Glucose, Bld: 67 mg/dL — ABNORMAL LOW (ref 70–99)
HCT: 33 % — ABNORMAL LOW (ref 36.0–46.0)
Hemoglobin: 11.2 g/dL — ABNORMAL LOW (ref 12.0–15.0)
Potassium: 4.1 mmol/L (ref 3.5–5.1)
Sodium: 139 mmol/L (ref 135–145)
TCO2: 26 mmol/L (ref 22–32)

## 2022-02-02 LAB — PROTIME-INR
INR: 1 (ref 0.8–1.2)
Prothrombin Time: 13.6 seconds (ref 11.4–15.2)

## 2022-02-02 LAB — APTT: aPTT: 36 seconds (ref 24–36)

## 2022-02-02 LAB — DIFFERENTIAL
Abs Immature Granulocytes: 0.02 10*3/uL (ref 0.00–0.07)
Basophils Absolute: 0 10*3/uL (ref 0.0–0.1)
Basophils Relative: 1 %
Eosinophils Absolute: 0.1 10*3/uL (ref 0.0–0.5)
Eosinophils Relative: 3 %
Immature Granulocytes: 0 %
Lymphocytes Relative: 39 %
Lymphs Abs: 1.8 10*3/uL (ref 0.7–4.0)
Monocytes Absolute: 0.4 10*3/uL (ref 0.1–1.0)
Monocytes Relative: 9 %
Neutro Abs: 2.1 10*3/uL (ref 1.7–7.7)
Neutrophils Relative %: 48 %

## 2022-02-02 LAB — RAPID URINE DRUG SCREEN, HOSP PERFORMED
Amphetamines: NOT DETECTED
Barbiturates: NOT DETECTED
Benzodiazepines: NOT DETECTED
Cocaine: NOT DETECTED
Opiates: NOT DETECTED
Tetrahydrocannabinol: NOT DETECTED

## 2022-02-02 LAB — ETHANOL: Alcohol, Ethyl (B): <10 mg/dL (ref <10)

## 2022-02-02 MED ORDER — LORAZEPAM 2 MG/ML IJ SOLN
1.0000 mg | Freq: Once | INTRAMUSCULAR | Status: AC | PRN
Start: 2022-02-02 — End: 2022-02-02
  Administered 2022-02-02: 1 mg via INTRAVENOUS
  Filled 2022-02-02: qty 1

## 2022-02-02 NOTE — Consult Note (Signed)
Neurology Consultation ? ?Reason for Consult: Code Stroke ?Referring Physician: Dr. Langston Masker ? ?CC: Stuttering speech, left lower extremity weakness ? ?History is obtained from: Patient, Chart review ? ?HPI: Alicia [name] is a 76 y.o. female with a medical history significant for type 2 diabetes mellitus, essential hypertension, hyperlipidemia, remote right basal ganglia stroke without residual deficit in March of 2020, morbid obesity, and coronary artery disease who presented to the ED on 3/17 for evaluation of left lower extremity weakness and speech disturbance. Patient states that she went to bed this morning at 01:00 in her usual state of health and woke up at 0800 with left lower extremity weakness and numbness in addition to trouble getting her words out prompting her to activate EMS. While in the ED, patient complained of persistent speech disturbance and left lower extremity weakness and a Code Stroke was activated for further neurology evaluation.  ? ?LKW: 0100 ?TNK given?: no, patient presented outside of the thrombolytic therapy window ?IR Thrombectomy? No, CTA not performed due to patient's renal function. Presentation is not felt to be consistent with an LVO.  ?Modified Rankin Scale: 0-Completely asymptomatic and back to baseline post- stroke ? ?ROS: A complete ROS was performed and is negative except as noted in the HPI.  ?Past Medical History:  ?Diagnosis Date  ? Anemia   ? Arthritis   ? "right knee" (10/21/2015)  ? CAD (coronary artery disease)   ? a. Cath 10/21/15: s/p DES to RCA: moderate diffuse stenosis of mod diagonal, diffuse irregularity LCx and LAD. 2D echo 10/21/15: mod LVH, EF 60-65%, no RWMA, calcified MV, mod-severe LAE. 03/18/17 PCI with DES--> 1 diag  ? Cataract   ? Diabetes ([place])   ? Essential hypertension   ? Family history of adverse reaction to anesthesia   ? "oldest sister, [name], related to brain formation at back of head; when they put her to sleep it's hard to wake her up"  ? GERD  (gastroesophageal reflux disease)   ? History of blood transfusion   ? "related to ruptured tubal pregnancy"  ? Hyperlipidemia   ? Hypertensive heart disease   ? Morbid obesity ([place])   ? Stroke (cerebrum) ([place])   ? Tubular adenoma of colon   ? Type II diabetes mellitus ([place])   ? ?Past Surgical History:  ?Procedure Laterality Date  ? ABDOMINAL HYSTERECTOMY  1988  ? fibroid  ? APPENDECTOMY  1985  ? [place] SURGERY  1984  ? BIOPSY  07/18/2020  ? Procedure: BIOPSY;  Surgeon: [name], MD;  Location: [facility];  Service: Gastroenterology;;  ? CARDIAC CATHETERIZATION N/A 10/21/2015  ? Procedure: Right/Left Heart Cath and Coronary Angiography;  Surgeon: [name], MD;  Location: [place] [facility];  Service: Cardiovascular;  Laterality: N/A;  ? CHOLECYSTECTOMY OPEN  1984  ? COLONOSCOPY WITH PROPOFOL N/A 07/18/2020  ? Procedure: COLONOSCOPY WITH PROPOFOL;  Surgeon: [name], MD;  Location: [facility];  Service: Gastroenterology;  Laterality: N/A;  ? CORONARY STENT INTERVENTION N/A 03/18/2017  ? Procedure: Coronary Stent Intervention;  Surgeon: [name], MD;  Location: [place] [facility];  Service: Cardiovascular;  Laterality: N/A;  ? CORONARY STENT PLACEMENT  03/18/2017  ? A OPTIMIZE STUDY Drug Eluting Stent (2.5 mm 18 mm - post-dilated to 2.7 mm) was successfully placed  ? ECTOPIC PREGNANCY SURGERY    ? ENDOSCOPIC MUCOSAL RESECTION N/A 01/23/2021  ? Procedure: ENDOSCOPIC MUCOSAL RESECTION;  Surgeon: Rush Landmark Telford Nab., MD;  Location: [facility];  Service: Gastroenterology;  Laterality: N/A;  ? ESOPHAGOGASTRODUODENOSCOPY (EGD) WITH PROPOFOL N/A 07/18/2020  ? Procedure: ESOPHAGOGASTRODUODENOSCOPY (EGD) WITH PROPOFOL;  Surgeon: [name], MD;  Location: [facility];  Service: Gastroenterology;  Laterality: N/A;  ? ESOPHAGOGASTRODUODENOSCOPY (EGD) WITH PROPOFOL N/A 01/23/2021  ? Procedure: ESOPHAGOGASTRODUODENOSCOPY (EGD) WITH PROPOFOL;  Surgeon: Rush Landmark Telford Nab., MD;  Location: [facility];  Service: Gastroenterology;  Laterality: N/A;  ? EUS N/A 01/23/2021  ? Procedure: UPPER ENDOSCOPIC ULTRASOUND (EUS) RADIAL;  Surgeon: Rush Landmark Telford Nab., MD;  Location: [facility];  Service: Gastroenterology;  Laterality: N/A;  ? HEMOSTASIS CLIP PLACEMENT  01/23/2021  ? Procedure: HEMOSTASIS CLIP PLACEMENT;  Surgeon: Irving Copas., MD;  Location: [facility];  Service: Gastroenterology;;  ? INTRAVASCULAR PRESSURE WIRE/FFR STUDY N/A 09/04/2019  ? Procedure: INTRAVASCULAR PRESSURE WIRE/FFR STUDY;  Surgeon: Nelva Bush, MD;  Location: [place] [facility];  Service: Cardiovascular;  Laterality: N/A;  ? LEFT HEART CATH AND CORONARY ANGIOGRAPHY N/A 03/18/2017  ? Procedure: Left Heart Cath and Coronary Angiography;  Surgeon: [name], MD;  Location: [place] [facility];  Service: Cardiovascular;  Laterality: N/A;  ? LEFT HEART CATH AND CORONARY ANGIOGRAPHY N/A 09/04/2019  ? Procedure: LEFT HEART CATH AND CORONARY ANGIOGRAPHY;  Surgeon: Nelva Bush, MD;  Location: [place] [facility];  Service: Cardiovascular;  Laterality: N/A;  ? POLYPECTOMY  07/18/2020  ? Procedure: POLYPECTOMY;  Surgeon: [name], MD;  Location: [facility];  Service: Gastroenterology;;  ? REDUCTION MAMMAPLASTY Bilateral ~ 1976  ? SUBMUCOSAL LIFTING INJECTION  01/23/2021  ? Procedure: SUBMUCOSAL LIFTING INJECTION;  Surgeon: Irving Copas., MD;  Location: [facility];  Service: Gastroenterology;;  ? TONSILLECTOMY    ? ?Family History  ?Problem Relation Age of Onset  ? Diabetes Mother   ? Heart attack Mother 83  ?     2007  ? Hypertension Mother   ? Hyperlipidemia Mother   ? Heart disease Mother   ? Heart attack Father   ? Stroke Father 44  ? Hypertension Father   ? Hyperlipidemia Father   ? Cancer Sister   ?     Gastric cancer in 2013  ? Stomach cancer Sister   ?     in the cavity behind the stomach  ? Diabetes Sister   ? Diabetes Sister   ? Diabetes Sister   ? Migraines Daughter   ?     brain surgery  ?  Migraines Daughter   ? Hypertension Son   ?     weight loss corrected that  ? Breast cancer Neg Hx   ? Colon cancer Neg Hx   ? Inflammatory bowel disease Neg Hx   ? Liver disease Neg Hx   ? Esophageal cancer Neg Hx   ? Pancreatic cancer Neg Hx   ? Rectal cancer Neg Hx   ? ?Social History:  ? reports that she has never smoked. She has never used smokeless tobacco. She reports that she does not drink alcohol and does not use drugs. ? ?Medications ? ?Current Facility-Administered Medications:  ?  LORazepam (ATIVAN) injection 1 mg, 1 mg, Intravenous, Once PRN, Trifan, Carola Rhine, MD ? ?Current Outpatient Medications:  ?  amLODipine (NORVASC) 10 MG tablet, TAKE 1 TABLET BY MOUTH EVERY DAY (Patient taking differently: Take 10 mg by mouth daily.), Disp: 90 tablet, Rfl: 3 ?  aspirin EC 81 MG tablet, Take 1 tablet (81 mg total) by mouth daily. Swallow whole., Disp: 30 tablet, Rfl: 11 ?  atorvastatin (LIPITOR) 80 MG tablet, TAKE  1 TABLET BY MOUTH DAILY AT 6 PM. (Patient taking differently: Take 80 mg by mouth at bedtime.), Disp: 90 tablet, Rfl: 3 ?  carvedilol (COREG) 25 MG tablet, Take 2 tablets (50 mg total) by mouth 2 (two) times daily with a meal. Please make yearly appt with Dr. Harrington Challenger for January 2023 for future refills. Thank you 1st attempt, Disp: 360 tablet, Rfl: 0 ?  Cholecalciferol 50 MCG (2000 UT) TABS, Take one tablet daily alternating with two tablets daily, Disp: 30 tablet, Rfl:  ?  clopidogrel (PLAVIX) 75 MG tablet, Take 1 tablet (75 mg total) by mouth daily., Disp: 90 tablet, Rfl: 3 ?  doxylamine, Sleep, (UNISOM) 25 MG tablet, Take 25 mg by mouth at bedtime., Disp: , Rfl:  ?  Evolocumab (REPATHA SURECLICK) 544 MG/ML SOAJ, INJECT 1 PEN INTO THE SKIN EVERY 14 (FOURTEEN) DAYS., Disp: 2 mL, Rfl: 11 ?  ferrous sulfate 325 (65 FE) MG tablet, TAKE 1 TABLET BY MOUTH EVERY DAY WITH BREAKFAST, Disp: 90 tablet, Rfl: 0 ?  furosemide (LASIX) 20 MG tablet, TAKE 1 TABLET BY MOUTH EVERY DAY, Disp: 90 tablet, Rfl: 2 ?  glipiZIDE  (GLUCOTROL) 10 MG tablet, Take 1 tablet (10 mg total) by mouth 2 (two) times daily before a meal., Disp: 180 tablet, Rfl: 1 ?  glucose blood (ONETOUCH ULTRA) test strip, 1 each by Other route 2 (two) times

## 2022-02-02 NOTE — ED Notes (Signed)
Discharge instructions reviewed and explained, pt verbalized understanding. ?

## 2022-02-02 NOTE — ED Notes (Signed)
Patient transported to MRI 

## 2022-02-02 NOTE — Telephone Encounter (Signed)
Per chart review tab pt is presently at [facility] ED. Sending note to Romilda Garret NP and Anastasiya CMA. ?

## 2022-02-02 NOTE — ED Triage Notes (Signed)
Pt went to bed at 0100 last night normal and woke up at 0800 with L leg numbness and weakness. Numbness and weakness resolved 15 mins later and reports there is no numbness or weakness at present. No drift, normal gait.  ?

## 2022-02-02 NOTE — Discharge Instructions (Addendum)
Please consider stopping at a pharmacy or large store like [employer] to purchase a walker to use at home for the next several days.  This will help give you some extra stability on your weak left leg.  Call your doctor's office to arrange for follow-up appointment ASAP for your visit to the ER. ? ?The CT scan of your brain, and the MRI of your brain did not show signs of a new stroke, brain tumor, or brain emergency. ? ?We placed a social work consult to see if we can assist with possible home physical therapy evaluation or home health.  You will be contacted by phone if they do not speak to you in the ED before you leave. ?

## 2022-02-02 NOTE — Telephone Encounter (Signed)
[facility] ?TELEPHONE ADVICE RECORD ?[facility]? ?Patient ?Name: Alicia [name] ?Gender: Female ?DOB: [birth year] ?Age: 76 Y 4 M 14 D ?Return ?Phone ?Number: ?[phone] ?(Primary) ?Address: ?City/ ?State/ ?Zip: ?[place] ? [address] ?Client [facility] - [facility] ?Client Site [facility] ?Provider Romilda Garret- NP ?Contact Type Call ?Who Is Calling Patient / Member / Family / Caregiver ?Call Type Triage / Clinical ?Relationship To Patient Self ?Return Phone Number [phone] (Primary) ?Chief Complaint Numbness ?Reason for Call Symptomatic / Request for Health Information ?Initial Comment Caller states she is experiencing numbness in her ?left leg. Caller was transferred by the office. ?Translation No ?Nurse Assessment ?Nurse: Terence Lux, RN, Christine Date/Time (Eastern Time): 02/02/2022 8:55:22 AM ?Confirm and document reason for call. If ?symptomatic, describe symptoms. ?---Caller states she is experiencing numbness in her ?left leg. began 0800 Caller was transferred by the ?office. ?Does the patient have any new or worsening ?symptoms? ---Yes ?Will a triage be completed? ---Yes ?Related visit to physician within the last 2 weeks? ---No ?Does the PT have any chronic conditions? (i.e. ?diabetes, asthma, this includes High risk factors for ?pregnancy, etc.) ?---Yes ?List chronic conditions. ---kidney problems ?Is this a behavioral health or substance abuse call? ---No ?Guidelines ?Guideline Title Affirmed Question Affirmed Notes Nurse Date/Time (Eastern ?Time) ?Neurologic Deficit [1] Weakness (i.e., ?paralysis, loss of ?muscle strength) of ?the face, arm / hand, ?or leg / foot on one ?side of the body AND ?[2] sudden onset ?AND [3] present now ?(Exception: Bell's ?palsy suspected [i.e., ?weakness only on ?Terence Lux, RN, ?Christine ?02/02/2022 8:57:43 ?AM ?PLEASE NOTE: All timestamps contained within this report are represented as  [place] Standard Time. ?CONFIDENTIALTY NOTICE: This fax transmission is intended only for the addressee. It contains information that is legally privileged, confidential or ?otherwise protected from use or disclosure. If you are not the intended recipient, you are strictly prohibited from reviewing, disclosing, copying using ?or disseminating any of this information or taking any action in reliance on or regarding this information. If you have received this fax in error, please ?notify us immediately by telephone so that we can arrange for its return to [place]. Phone: [phone], Toll-Free: [phone], Fax: [phone] ?Page: 2 of 2 ?Call Id: [id number] ?Guidelines ?Guideline Title Affirmed Question Affirmed Notes Nurse Date/Time (Eastern ?Time) ?one side of the face, ?developing over ?hours to days, no ?other symptoms]) ?Disp. Time (Eastern ?Time) Disposition Final User ?02/02/2022 8:59:43 AM 911 Outcome Documentation Terence Lux, RN, Altha Harm ?Reason: refused callback ?02/02/2022 8:58:45 AM Call EMS 911 Now Yes Terence Lux, RN, Altha Harm ?Caller Disagree/Comply Comply ?Caller Understands Yes ?PreDisposition Call Doctor ?Care Advice Given Per Guideline ?CALL EMS 911 NOW: * Immediate medical attention is needed. You need to hang up and call 911 (or an ambulance). * Triager ?Discretion: I'll call you back in a few minutes to be sure you were able to reach them. CARE ADVICE given per Neurologic Deficit ?(Adult) guideline ?

## 2022-02-02 NOTE — Code Documentation (Signed)
Stroke Response Nurse Documentation ?Code Documentation ? ?Alicia [name] is a 76 y.o. female arriving to [facility]  via [facility] on 02/02/22 with past medical hx of CAD, DM, HTN, GERD, HLD, obesity, stroke. On aspirin 81 mg daily. Code stroke was activated by ED.  ? ?Patient from home where she was LKW at 0100 when she went to bed. She arrives complaining of left leg leg numbness and weakness as well as difficulty speaking.   ? ?Labs drawn and patient cleared for CT by Dr. Langston Masker. Stroke team at the bedside on patient activation. Patient to CT with team. NIHSS 3, see documentation for details and code stroke times. Patient with left arm weakness and left leg weakness on exam.  ? ?The following imaging was completed:  CT Head. Patient is not a candidate for IV Thrombolytic due to outside window. Patient is not not a candidate for IR due to no CTA not performed due patient renal function and presentation not felt to be consistent with LVO per MD.  ? ?Care Plan: q2h NIH and vitals.  ? ?Bedside handoff with ED EMT [name].   ? ?Alicia [name]  ?Stroke Response RN ? ? ?

## 2022-02-02 NOTE — Evaluation (Addendum)
Physical Therapy Evaluation ?Patient Details ?Name: Alicia [name] ?MRN: [id number] ?DOB: [birth year] ?Today's Date: 02/02/2022 ? ?History of Present Illness ? Pt presented to ED after waking up with left sided weakness that has since resolved. MRI and CT were negative. PMH - DM, htn, rt CVA, morbid obesity, CAD  ?Clinical Impression ? Pt presents to PT very close to baseline with mobility after transient lt weakness this AM. Pt reports she feel she is walking a little slower but otherwise baseline. Pt with a stagger coming out of bathroom unsure of cause and pt able to continue amb back to room without difficulty. Pt declined use of assistive device and eager to return home.  Recommend HHPT to assess transition to home.  ?   ? ?Recommendations for follow up therapy are one component of a multi-disciplinary discharge planning process, led by the attending physician.  Recommendations may be updated based on patient status, additional functional criteria and insurance authorization. ? ?Follow Up Recommendations Home health PT ? ?  ?Assistance Recommended at Discharge Intermittent Supervision/Assistance  ?Patient can return home with the following ? Help with stairs or ramp for entrance ? ?  ?Equipment Recommendations None recommended by PT  ?Recommendations for Other Services ?    ?  ?Functional Status Assessment Patient has not had a recent decline in their functional status  ? ?  ?Precautions / Restrictions Precautions ?Precautions: Fall  ? ?  ? ?Mobility ? Bed Mobility ?Overal bed mobility: Modified Independent ?  ?  ?  ?  ?  ?  ?  ?  ? ?Transfers ?Overall transfer level: Modified independent ?Equipment used: None ?  ?  ?  ?  ?  ?  ?  ?General transfer comment: Able to stand from stretcher and from commode without assistance ?  ? ?Ambulation/Gait ?Ambulation/Gait assistance: Supervision, Min assist ?Gait Distance (Feet): 150 Feet ?Assistive device: None ?Gait Pattern/deviations: Step-through pattern, Decreased stride  length, Staggering right, Wide base of support ?Gait velocity: decr ?Gait velocity interpretation: <1.31 ft/sec, indicative of household ambulator ?  ?General Gait Details: Pt amb down hallway with slow, steady gait. Stopped at bathroom on way back to room. Coming out of bathroom pt with a stagger with min assist to correct. Returned to room and amb around room with steady gait. ? ?Stairs ?  ?  ?  ?  ?  ? ?Wheelchair Mobility ?  ? ?Modified Rankin (Stroke Patients Only) ?  ? ?  ? ?Balance Overall balance assessment: Mild deficits observed, not formally tested ?  ?  ?  ?  ?  ?  ?  ?  ?  ?  ?  ?  ?  ?  ?  ?  ?  ?  ?   ? ? ? ?Pertinent Vitals/Pain Pain Assessment ?Pain Assessment: No/denies pain  ? ? ?Home Living Family/patient expects to be discharged to:: Private residence ?Living Arrangements: Other relatives ?Available Help at Discharge: Family;Available PRN/intermittently ?Type of Home: House ?Home Access: Ramped entrance ?  ?  ?Alternate Level Stairs-Number of Steps: 6 ?Home Layout: Multi-level;Able to live on main level with bedroom/bathroom ?Home Equipment: Cane - quad;Wheelchair - manual;Wheelchair - power;Adaptive equipment;Hand held shower head;Grab bars - toilet;Grab bars - tub/shower;Hospital bed ?Additional Comments: stays downstairs  ?  ?Prior Function Prior Level of Function : Driving;Independent/Modified Independent ?  ?  ?  ?  ?  ?  ?Mobility Comments: Doesn't use assistive device ?  ?  ? ? ?Hand Dominance  ? Dominant Hand:  Right ? ?  ?Extremity/Trunk Assessment  ? Upper Extremity Assessment ?Upper Extremity Assessment: Overall WFL for tasks assessed ?  ? ?Lower Extremity Assessment ?Lower Extremity Assessment: Generalized weakness ?  ? ?   ?Communication  ? Communication: No difficulties  ?Cognition Arousal/Alertness: Awake/alert ?Behavior During Therapy: [facility] for tasks assessed/performed ?Overall Cognitive Status: Within Functional Limits for tasks assessed ?  ?  ?  ?  ?  ?  ?  ?  ?  ?  ?  ?  ?  ?  ?   ?  ?  ?  ?  ? ?  ?General Comments   ? ?  ?Exercises    ? ?Assessment/Plan  ?  ?PT Assessment All further PT needs can be met in the next venue of care  ?PT Problem List Decreased balance ? ?   ?  ?PT Treatment Interventions     ? ?PT Goals (Current goals can be found in the Care Plan section)  ?Acute Rehab PT Goals ?PT Goal Formulation: All assessment and education complete, DC therapy ? ?  ?Frequency   ?  ? ? ?Co-evaluation   ?  ?  ?  ?  ? ? ?  ?AM-PAC PT "6 Clicks" Mobility  ?Outcome Measure Help needed turning from your back to your side while in a flat bed without using bedrails?: None ?Help needed moving from lying on your back to sitting on the side of a flat bed without using bedrails?: None ?Help needed moving to and from a bed to a chair (including a wheelchair)?: None ?Help needed standing up from a chair using your arms (e.g., wheelchair or bedside chair)?: None ?Help needed to walk in hospital room?: A Little ?Help needed climbing 3-5 steps with a railing? : A Little ?6 Click Score: 22 ? ?  ?End of Session   ?Activity Tolerance: Patient tolerated treatment well ?Patient left: in bed;with call bell/phone within reach;with family/visitor present (Pt siting edge of stretcher) ?Nurse Communication: Mobility status ?PT Visit Diagnosis: Difficulty in walking, not elsewhere classified (R26.2) ?  ? ?Time: 8182-9937 ?PT Time Calculation (min) (ACUTE ONLY): 16 min ? ? ?Charges:   PT Evaluation ?$PT Eval Low Complexity: 1 Low ?  ?  ?   ? ? ?[facility] PT ?Acute Rehabilitation Services ?Pager [phone] ?Office [phone] ? ? ?Shary Decamp [facility] ?02/02/2022, 5:09 PM ?

## 2022-02-02 NOTE — ED Provider Notes (Signed)
[facility] EMERGENCY DEPARTMENT Provider Note   CSN: [id number] Arrival date & time: 02/02/22  4401  An emergency department physician performed an initial assessment on this suspected stroke patient at 1020.  History  CC: Left weakness   Alicia [name] is a 76 y.o. female present emergency department left-sided weakness and expressive aphasia.  The patient reports that she went to bed around 1 AM last night feeling normal.  She woke up around 8 AM and said that her left leg was weak and numb, she had difficulty picking it up.  She is also having difficulty speaking and getting words out.  She says "my head is not right".  She does have a history of a prior stroke Hx of prior CVA, per MRI in march 2020 this was right basal ganglia infarct, she is on on aspirin + plavix.  Hx of DM and HTN  HPI     Home Medications Prior to Admission medications   Medication Sig Start Date End Date Taking? Authorizing Provider  amLODipine (NORVASC) 10 MG tablet TAKE 1 TABLET BY MOUTH EVERY DAY Patient taking differently: Take 10 mg by mouth daily. 05/11/21   Jodelle Gross, NP  aspirin EC 81 MG tablet Take 1 tablet (81 mg total) by mouth daily. Swallow whole. 01/24/21   Mansouraty, Netty Starring., MD  atorvastatin (LIPITOR) 80 MG tablet TAKE 1 TABLET BY MOUTH DAILY AT 6 PM. Patient taking differently: Take 80 mg by mouth at bedtime. 11/21/20   Pricilla Riffle, MD  carvedilol (COREG) 25 MG tablet Take 2 tablets (50 mg total) by mouth 2 (two) times daily with a meal. Please make yearly appt with Dr. Tenny Craw for January 2023 for future refills. Thank you 1st attempt 11/16/21   Pricilla Riffle, MD  Cholecalciferol 50 MCG (2000 UT) TABS Take one tablet daily alternating with two tablets daily 02/16/21   Pricilla Riffle, MD  clopidogrel (PLAVIX) 75 MG tablet Take 1 tablet (75 mg total) by mouth daily. 05/29/21   Pricilla Riffle, MD  doxylamine, Sleep, (UNISOM) 25 MG tablet Take 25 mg by mouth at bedtime.     [provider]  Evolocumab (REPATHA SURECLICK) 140 MG/ML SOAJ INJECT 1 PEN INTO THE SKIN EVERY 14 (FOURTEEN) DAYS. 12/05/21   Pricilla Riffle, MD  ferrous sulfate 325 (65 FE) MG tablet TAKE 1 TABLET BY MOUTH EVERY DAY WITH BREAKFAST 01/26/22   [name], NP  furosemide (LASIX) 20 MG tablet TAKE 1 TABLET BY MOUTH EVERY DAY 08/14/21   Pricilla Riffle, MD  glipiZIDE (GLUCOTROL) 10 MG tablet Take 1 tablet (10 mg total) by mouth 2 (two) times daily before a meal. 03/29/21   Lynnda Child, MD  glucose blood (ONETOUCH ULTRA) test strip 1 each by Other route 2 (two) times daily. LAST REFILL SCHEDULE PHYSICAL 01/18/20   Lorre Munroe, NP  isosorbide mononitrate (IMDUR) 60 MG 24 hr tablet Take 1 tablet (60 mg total) by mouth daily. Please make overdue appt with Dr. Tenny Craw before anymore refills. Thank you 1st attempt 12/05/21   Pricilla Riffle, MD  linagliptin (TRADJENTA) 5 MG TABS tablet TAKE 1 TABLET (5 MG TOTAL) BY MOUTH DAILY. Patient taking differently: Take 5 mg by mouth daily. 03/29/21   Lynnda Child, MD  losartan (COZAAR) 100 MG tablet Take 1 tablet (100 mg total) by mouth daily. 06/08/21   Pricilla Riffle, MD  losartan (COZAAR) 50 MG tablet Take 1 tablet (50  mg total) by mouth daily. 12/14/20   Pricilla Riffle, MD  Magnesium 200 MG TABS Take 200 mg by mouth daily.    [provider]  metFORMIN (GLUCOPHAGE) 1000 MG tablet Take 1 tablet (1,000 mg total) by mouth 2 (two) times daily with a meal. 07/21/21   Lynnda Child, MD  nitroGLYCERIN (NITROSTAT) 0.4 MG SL tablet Place 1 tablet (0.4 mg total) under the tongue every 5 (five) minutes as needed for chest pain. 09/02/19   Dyann Kief, PA-C  omeprazole (PRILOSEC) 40 MG capsule TAKE 1 CAPSULE BY MOUTH EVERY DAY 12/04/21   [name], NP  ondansetron (ZOFRAN-ODT) 4 MG disintegrating tablet TAKE 1 TABLET BY MOUTH EVERY 8 HOURS AS NEEDED FOR NAUSEA AND VOMITING Patient taking differently: Take 4 mg by mouth every 8 (eight) hours as needed for  vomiting or nausea. 11/06/21   [name], NP  spironolactone (ALDACTONE) 25 MG tablet TAKE 1 TABLET BY MOUTH EVERY DAY 12/05/21   Pricilla Riffle, MD  vitamin B-12 (CYANOCOBALAMIN) 500 MCG tablet Take 2 tablets (1,000 mcg total) by mouth 2 (two) times daily. 05/16/21   Eulis Foster, FNP  Vitamin D, Ergocalciferol, (DRISDOL) 1.25 MG (50000 UNIT) CAPS capsule Take 1 capsule (50,000 Units total) by mouth every 7 (seven) days. Please return for vit d level check on 02/10/21. 12/14/20   Pricilla Riffle, MD      Allergies    Iodinated contrast media and Sulfa antibiotics    Review of Systems   Review of Systems  Physical Exam Updated Vital Signs BP 135/84 (BP Location: Left Wrist)   Pulse 65   Temp 98.4 F (36.9 C) (Oral)   Resp 18   SpO2 99%  Physical Exam Constitutional:      General: She is not in acute distress. HENT:     Head: Normocephalic and atraumatic.  Eyes:     Conjunctiva/sclera: Conjunctivae normal.     Pupils: Pupils are equal, round, and reactive to light.  Cardiovascular:     Rate and Rhythm: Normal rate and regular rhythm.  Pulmonary:     Effort: Pulmonary effort is normal. No respiratory distress.  Abdominal:     General: There is no distension.     Tenderness: There is no abdominal tenderness.  Musculoskeletal:     Right lower leg: Edema present.     Left lower leg: Edema present.  Skin:    General: Skin is warm and dry.  Neurological:     General: No focal deficit present.     Mental Status: She is alert. Mental status is at baseline.     Comments: Speech slow, occasionally slurred Left leg strength on hip flexion 4/5, right leg strength 4/5 and 5/5 for different examiners Normal finger to nose testing  Psychiatric:        Mood and Affect: Mood normal.        Behavior: Behavior normal.    ED Results / Procedures / Treatments   Labs (all labs ordered are listed, but only abnormal results are displayed) Labs Reviewed  CBC - Abnormal; Notable for the  following components:      Result Value   Hemoglobin 9.5 (*)    HCT 32.0 (*)    MCV 77.3 (*)    MCH 22.9 (*)    MCHC 29.7 (*)    RDW 15.9 (*)    All other components within normal limits  COMPREHENSIVE METABOLIC PANEL - Abnormal; Notable for the  following components:   BUN 35 (*)    Creatinine, Ser 2.56 (*)    Calcium 8.8 (*)    Total Protein 6.3 (*)    AST 13 (*)    GFR, Estimated 19 (*)    All other components within normal limits  I-STAT CHEM 8, ED - Abnormal; Notable for the following components:   BUN 34 (*)    Creatinine, Ser 2.70 (*)    Glucose, Bld 67 (*)    Calcium, Ion 1.14 (*)    Hemoglobin 11.2 (*)    HCT 33.0 (*)    All other components within normal limits  RESP PANEL BY RT-PCR (FLU A&B, COVID) ARPGX2  ETHANOL  PROTIME-INR  APTT  DIFFERENTIAL  RAPID URINE DRUG SCREEN, HOSP PERFORMED  URINALYSIS, ROUTINE W REFLEX MICROSCOPIC    EKG EKG Interpretation  Date/Time:  Friday February 02 2022 10:01:17 EDT Ventricular Rate:  72 PR Interval:  137 QRS Duration: 85 QT Interval:  392 QTC Calculation: 429 R Axis:   54 Text Interpretation: Sinus rhythm Borderline T abnormalities, inferior leads Confirmed by Alvester Chou [phone]) on 02/02/2022 10:50:35 AM  Radiology MR BRAIN WO CONTRAST  Result Date: 02/02/2022 CLINICAL DATA:  Neuro deficit, acute, stroke suspected expressive aphasia, left arm and leg weakness, hx of right basal ganglia infarct EXAM: MRI HEAD WITHOUT CONTRAST TECHNIQUE: Multiplanar, multiecho pulse sequences of the brain and surrounding structures were obtained without intravenous contrast. COMPARISON:  March 2020 FINDINGS: Brain: There is no acute infarction or intracranial hemorrhage. There is no intracranial mass, mass effect, or edema. There is no hydrocephalus or extra-axial fluid collection. Ventricles and sulci are within normal limits in size and configuration. Chronic small vessel infarct or prominent perivascular space of the right lentiform  nucleus. Additional patchy foci of T2 hyperintensity in the supratentorial white matter are nonspecific but may reflect mild chronic microvascular ischemic changes. There is likely incidental unchanged asymmetric prominence of the right foraminal ovale with CSF signal intensity. Vascular: Major vessel flow voids at the skull base are preserved. Skull and upper cervical spine: Normal marrow signal is preserved. Sinuses/Orbits: Paranasal sinuses are aerated. Orbits are unremarkable. Other: "Empty" sella.  Partial opacification right mastoid tip. IMPRESSION: No acute infarction, hemorrhage, or mass. Mild chronic microvascular ischemic changes. Electronically Signed   By: Guadlupe Spanish M.D.   On: 02/02/2022 12:58   CT HEAD CODE STROKE WO CONTRAST  Result Date: 02/02/2022 CLINICAL DATA:  Code stroke. Neuro deficit, acute, stroke suspected; expressive aphasia left sided weakness EXAM: CT HEAD WITHOUT CONTRAST TECHNIQUE: Contiguous axial images were obtained from the base of the skull through the vertex without intravenous contrast. RADIATION DOSE REDUCTION: This exam was performed according to the departmental dose-optimization program which includes automated exposure control, adjustment of the mA and/or kV according to patient size and/or use of iterative reconstruction technique. COMPARISON:  01/19/2019 FINDINGS: Brain: There is no acute intracranial hemorrhage, mass effect, or edema. Gray-white differentiation is preserved. Chronic small vessel infarct or prominent perivascular space of the right lentiform nucleus. Ventricles and sulci are normal in size and configuration. Vascular: No hyperdense vessel. Skull: Unremarkable. Sinuses/Orbits: No acute abnormality. Other: Mastoid air cells are clear. ASPECTS ([place] Stroke Program Early CT Score) - Ganglionic level infarction (caudate, lentiform nuclei, internal capsule, insula, M1-M3 cortex): 7 - Supraganglionic infarction (M4-M6 cortex): 3 Total score (0-10 with  10 being normal): 10 IMPRESSION: There is no acute intracranial hemorrhage or evidence of acute infarction. ASPECT score is 10. These results were communicated to  Dr. Amada Jupiter at 10:43 am on 02/02/2022 by text page via the [facility] messaging system. Electronically Signed   By: Guadlupe Spanish M.D.   On: 02/02/2022 10:46    Procedures Procedures    Medications Ordered in ED Medications  LORazepam (ATIVAN) injection 1 mg (1 mg Intravenous Given 02/02/22 1202)    ED Course/ Medical Decision Making/ A&P Clinical Course as of 02/02/22 1648  Fri Feb 02, 2022  1024 Activate code stroke per VAN criteria for possible LVO, left sided weakness, expressive aphasia, onset < 12 hours ago. [MT]  1042 Back from CT, neurology assessed the patient, no evident infarct noted on CT per neurologist report.  Neurologist is recommending MRI of the brain, which has been ordered, along with Ativan for anxiety as the patient is claustrophobic.   [MT]  1[address] [place] updated by phone - awaiting MRI [MT]  1302 IMPRESSION: No acute infarction, hemorrhage, or mass.   Mild chronic microvascular ischemic changes. [MT]  1324 MRI does not show acute infarct.  Per neurology consultation, if no acute infarct noted on MRI, no further work-up from a neurological perspective is emergently indicated.  I do not otherwise see evidence of medical emergency, including cord compression.  I agree that her neurological strength testing exam is not consistent.  Patient will be ambulated, if able to do so will be discharged home and advised her PCP follow-up. [MT]  1324 Otherwise her labs appear to be baseline, including her chronic kidney disease creatinine at 2.5, and her chronic anemia with hemoglobin of 9.5. [MT]  1343 We will attempt to ambulate patient [MT]    Clinical Course User Index [MT] Raniah Karan, Kermit Balo, MD                           Medical Decision Making Amount and/or Complexity of Data Reviewed Labs: ordered. Radiology:  ordered.  Risk Prescription drug management.   This patient presents to the ED with concern for slurred speech, left-sided weakness, "brain fog".. This involves an extensive number of treatment options, and is a complaint that carries with it a high risk of complications and morbidity.  The differential diagnosis includes CVA vs dehydration versus atypical infection versus neuropsychiatric/functional disturbance vs other  Additional history obtained from patient's son by phone, sister present in the  External records from outside source obtained and reviewed including MRI from 2020 showing prior right basal ganglia infarct  She also had an outpatient DVT study done for chronic lower extremity edema which showed no focal DVT per my review of the external records.  She has symmetrical edema and large lower extremities, which may be limiting her mobility.  I ordered and personally interpreted labs.  The pertinent results include: CMP, CBC at baseline levels.  COVID and flu are negative  I ordered imaging studies including CT head, MRI brain I independently visualized and interpreted imaging which showed no acute or focal infarct I agree with the radiologist interpretation  The patient was maintained on a cardiac monitor.  I personally viewed and interpreted the cardiac monitored which showed an underlying rhythm of: Normal sinus rhythm  Per my interpretation the patient's ECG shows sinus rhythm without acute ischemic finding  I ordered medication including Ativan for anxiety for MRI  Test Considered:  -Lower suspicion for acute DVT as noted above, she has had this work-up done in the past.  Low suspicion likewise for cauda equina syndrome as she has no back pain  or traumatic mechanism to suggest an injury to the spinal cord or lower back.  Did not feel an emergent MRI was necessary.  I requested consultation with the neurology,  and discussed lab and imaging findings as well as pertinent  plan - they recommend: See ED course  After the interventions noted above, I reevaluated the patient and found that they have: improved  Dispostion:  After consideration of the diagnostic results and the patients response to treatment, I feel that the patent would benefit from outpatient follow-up with PCP, possible home PT evaluation if difficulty with ambulation.  Advised the patient and her family member at the bedside to purchase and use a walker at home for the next several days for additional stability.         Final Clinical Impression(s) / ED Diagnoses Final diagnoses:  Ambulatory dysfunction  Left leg weakness    Rx / DC Orders ED Discharge Orders     None         Terald Sleeper, MD 02/02/22 [phone]

## 2022-02-02 NOTE — ED Notes (Signed)
Pt was able to stand and ambulate around the bed without requiring assistance however pt did c/o dizziness and not feeling "as steady as normal" while ambulating.  ?

## 2022-02-02 NOTE — Telephone Encounter (Signed)
Noted patient to be in the emergency department also  ?

## 2022-02-02 NOTE — ED Notes (Signed)
Pt in MRI.

## 2022-02-02 NOTE — Progress Notes (Signed)
Transition of Care [facility]) - Emergency Department Mini Assessment ? ? ?Patient Details  ?Name: Alicia [name] ?MRN: [id number] ?Date of Birth: February 23, 1946 ? ?Transition of Care (TOC) CM/SW Contact:    ?Laurena Slimmer, RN ?Phone Number: ?02/02/2022, 5:36 PM ? ? ?Clinical Narrative: ?ED RNCM consulted concerning [place] recommendations. Patient evaluated in the [facility] ED for left side weakness, ED evaluation was neg for stroke.  HH was discussed patient is agreeable to [facility] services.  Discussed that CM will send referral to a preferred [facility] agencies within patient's health insurance network. Patient was agreeable, sent to [facility]  who has accepted referral. Will update patient.  Patient may also benefit from a rolling walker order obtained from [place] will send referral to [facility] who will contact patient with details of delivery of DME.  No further ED RN Care Management needs  ? ?ED Mini Assessment: ?What brought you to the Emergency Department? : (Pended) Patient came with left side weakness complaints ? ?  ? ?  ? ?  ? ?Interventions which prevented an admission or readmission: (Pended) [place] or Services ? ? ? ?Patient Contact and Communications ?  ?  ?  ? ,     ?  ?  ? ?  ?  ?  ? ?Admission diagnosis:  Numbness ?Patient Active Problem List  ? Diagnosis Date Noted  ? Lower extremity edema 12/25/2021  ? DOE (dyspnea on exertion) 11/15/2021  ? CKD stage G4/A1, GFR 15-29 and albumin creatinine ratio <30 mg/g (HCC) 11/06/2021  ? Other fatigue 11/06/2021  ? Medicare annual wellness visit, subsequent 11/06/2021  ? Paresthesia 09/05/2021  ? Cataracts, bilateral 07/04/2021  ? Iron deficiency anemia 04/05/2021  ? Leg cramping 03/29/2021  ? Abnormal findings on esophagogastroduodenoscopy (EGD) 11/25/2020  ? Dysphagia   ? Gastritis without bleeding   ? Duodenal nodule   ? History of colonic polyps   ? Benign neoplasm of descending colon   ? OSA (obstructive sleep apnea) 01/20/2019  ? Stroke [facility]) status post IV TPA 01/18/2019  ?  OA (osteoarthritis) of knee 12/20/2017  ? Type 2 diabetes mellitus without complication, without long-term current use of insulin ([place]) 12/20/2017  ? Progressive angina ([place])   ? Localized edema 02/23/2016  ? CAD S/P percutaneous coronary angioplasty 10/22/2015  ? Essential hypertension 10/22/2015  ? Microcytic anemia 10/22/2015  ? Hyperlipidemia 10/09/2012  ? GERD (gastroesophageal reflux disease) 10/09/2012  ? ?PCP:  Michela Pitcher, NP ?Pharmacy:   ?[id number]?Phone: [phone]: [phone]?  ?

## 2022-02-05 ENCOUNTER — Ambulatory Visit: Payer: Medicare PPO | Admitting: Nurse Practitioner

## 2022-02-05 ENCOUNTER — Other Ambulatory Visit: Payer: Self-pay

## 2022-02-05 VITALS — BP 128/72 | HR 69 | Temp 96.7°F | Resp 16 | Ht 65.0 in | Wt 304.2 lb

## 2022-02-05 DIAGNOSIS — H6121 Impacted cerumen, right ear: Secondary | ICD-10-CM | POA: Insufficient documentation

## 2022-02-05 DIAGNOSIS — H6123 Impacted cerumen, bilateral: Secondary | ICD-10-CM

## 2022-02-05 DIAGNOSIS — R29898 Other symptoms and signs involving the musculoskeletal system: Secondary | ICD-10-CM | POA: Insufficient documentation

## 2022-02-05 DIAGNOSIS — R42 Dizziness and giddiness: Secondary | ICD-10-CM | POA: Insufficient documentation

## 2022-02-05 NOTE — Assessment & Plan Note (Signed)
Recent occurrence.  Patient did have bilateral impacted cerumen that were able to clear.  Also states is more so when she has a coughing fit due to secondary cigarette smoke exposure.  Do not think meclizine would be of any benefit to this.  Neurological exam benign today in office ?

## 2022-02-05 NOTE — Assessment & Plan Note (Signed)
Recent onset was evaluated emergency department CVA was ruled out.  PT was set up patient awaiting phone call for physical therapy to be started.  Patient likely be a good candidate for in-home PT patient was advised to use ambulatory aid which she does not want to use currently did at least try to convince her to use walls or furniture around her if she becomes to feel unsteady. ?

## 2022-02-05 NOTE — Patient Instructions (Signed)
Nice to see you today ?If you do not hear from physical therapy reach out to the office and let me know ?I want to see you in 3 months just to make sure that you are still doing ok ?If you need me in between you let me know and can always schedule up ?

## 2022-02-05 NOTE — Assessment & Plan Note (Signed)
Verbal consent obtained.  Cerumen softening eardrops were instilled per office policy.  A mixture of hydroperoxide and water was used per office policy and bilateral ears were irrigated.  Cerumen impaction was dislodged.  Patient tolerated procedure well.  TMs within normal limits post irrigation ?

## 2022-02-05 NOTE — Progress Notes (Signed)
ight ? ?Established Patient Office Visit ? ?Subjective:  ?Patient ID: Alicia [name], female    DOB: 12-03-1945  Age: 76 y.o. MRN: [id number] ? ?CC:  ?Chief Complaint  ?Patient presents with  ? ER follow up  ?  Still having dizziness, present for a week and a half. Left Leg does feel like it "wants to give out."  ? ? ?HPI ?Alicia [name] presents for ER follow up ? ?Patient was seen and evaluated on 02/02/2022 at [facility] emergency department.  They called a code stroke and patient she underwent CT head without contrast and MRI of head that showed no acute infarct.  We did not do perfusion scan due to patient's kidney function.  Neurology was consulted and cleared from stroke standpoint as they this could be may be complex migraine. ? ?States that since her ED visit she is having trounble completing thoughts and getting information out. She is also expereicing some dizziness.  When questioned it was more of a lightheadedness after coughing from being exposed to cigarette smoke in the home. ?States that her left leg is better than it was but not back to normal. States that she did have a fall before the ED visit.  Patient was referred to at home PT.  As of now no appointments been set up.  Patient states spouse to be contacted today and has contact information of the group that would help service her.  In ED note they recommended using a walker or cane as a walking aid.  Patient has not been doing that as she states she feels like she will be dependent upon a if she starts using them. ?Stays with brother and sister currently. ? ?States that she was told not drive until seeing PCP. She has to relay on other people for transportion ? ?Past Medical History:  ?Diagnosis Date  ? Anemia   ? Arthritis   ? "right knee" (10/21/2015)  ? CAD (coronary artery disease)   ? a. Cath 10/21/15: s/p DES to RCA: moderate diffuse stenosis of mod diagonal, diffuse irregularity LCx and LAD. 2D echo 10/21/15: mod LVH, EF 60-65%, no RWMA,  calcified MV, mod-severe LAE. 03/18/17 PCI with DES--> 1 diag  ? Cataract   ? Diabetes ([place])   ? Essential hypertension   ? Family history of adverse reaction to anesthesia   ? "oldest sister, [name], related to brain formation at back of head; when they put her to sleep it's hard to wake her up"  ? GERD (gastroesophageal reflux disease)   ? History of blood transfusion   ? "related to ruptured tubal pregnancy"  ? Hyperlipidemia   ? Hypertensive heart disease   ? Morbid obesity ([place])   ? Stroke (cerebrum) ([place])   ? Tubular adenoma of colon   ? Type II diabetes mellitus ([place])   ? ? ?Past Surgical History:  ?Procedure Laterality Date  ? ABDOMINAL HYSTERECTOMY  1988  ? fibroid  ? APPENDECTOMY  1985  ? [place] SURGERY  1984  ? BIOPSY  07/18/2020  ? Procedure: BIOPSY;  Surgeon: [name], MD;  Location: [facility];  Service: Gastroenterology;;  ? CARDIAC CATHETERIZATION N/A 10/21/2015  ? Procedure: Right/Left Heart Cath and Coronary Angiography;  Surgeon: [name], MD;  Location: [place] [facility];  Service: Cardiovascular;  Laterality: N/A;  ? CHOLECYSTECTOMY OPEN  1984  ? COLONOSCOPY WITH PROPOFOL N/A 07/18/2020  ? Procedure: COLONOSCOPY WITH PROPOFOL;  Surgeon: [name], MD;  Location: [facility];  Service: Gastroenterology;  Laterality: N/A;  ? CORONARY STENT INTERVENTION N/A 03/18/2017  ? Procedure: Coronary Stent Intervention;  Surgeon: [name], MD;  Location: [place] [facility];  Service: Cardiovascular;  Laterality: N/A;  ? CORONARY STENT PLACEMENT  03/18/2017  ? A OPTIMIZE STUDY Drug Eluting Stent (2.5 mm 18 mm - post-dilated to 2.7 mm) was successfully placed  ? ECTOPIC PREGNANCY SURGERY    ? ENDOSCOPIC MUCOSAL RESECTION N/A 01/23/2021  ? Procedure: ENDOSCOPIC MUCOSAL RESECTION;  Surgeon: Rush Landmark Telford Nab., MD;  Location: [facility];  Service: Gastroenterology;  Laterality: N/A;  ? ESOPHAGOGASTRODUODENOSCOPY (EGD) WITH PROPOFOL N/A 07/18/2020  ? Procedure:  ESOPHAGOGASTRODUODENOSCOPY (EGD) WITH PROPOFOL;  Surgeon: [name], MD;  Location: [facility];  Service: Gastroenterology;  Laterality: N/A;  ? ESOPHAGOGASTRODUODENOSCOPY (EGD) WITH PROPOFOL N/A 01/23/2021  ? Procedure: ESOPHAGOGASTRODUODENOSCOPY (EGD) WITH PROPOFOL;  Surgeon: Rush Landmark Telford Nab., MD;  Location: [facility];  Service: Gastroenterology;  Laterality: N/A;  ? EUS N/A 01/23/2021  ? Procedure: UPPER ENDOSCOPIC ULTRASOUND (EUS) RADIAL;  Surgeon: Rush Landmark Telford Nab., MD;  Location: [facility];  Service: Gastroenterology;  Laterality: N/A;  ? HEMOSTASIS CLIP PLACEMENT  01/23/2021  ? Procedure: HEMOSTASIS CLIP PLACEMENT;  Surgeon: Irving Copas., MD;  Location: [facility];  Service: Gastroenterology;;  ? INTRAVASCULAR PRESSURE WIRE/FFR STUDY N/A 09/04/2019  ? Procedure: INTRAVASCULAR PRESSURE WIRE/FFR STUDY;  Surgeon: Nelva Bush, MD;  Location: [place] [facility];  Service: Cardiovascular;  Laterality: N/A;  ? LEFT HEART CATH AND CORONARY ANGIOGRAPHY N/A 03/18/2017  ? Procedure: Left Heart Cath and Coronary Angiography;  Surgeon: [name], MD;  Location: [place] [facility];  Service: Cardiovascular;  Laterality: N/A;  ? LEFT HEART CATH AND CORONARY ANGIOGRAPHY N/A 09/04/2019  ? Procedure: LEFT HEART CATH AND CORONARY ANGIOGRAPHY;  Surgeon: Nelva Bush, MD;  Location: [place] [facility];  Service: Cardiovascular;  Laterality: N/A;  ? POLYPECTOMY  07/18/2020  ? Procedure: POLYPECTOMY;  Surgeon: [name], MD;  Location: [facility];  Service: Gastroenterology;;  ? REDUCTION MAMMAPLASTY Bilateral ~ 1976  ? SUBMUCOSAL LIFTING INJECTION  01/23/2021  ? Procedure: SUBMUCOSAL LIFTING INJECTION;  Surgeon: Rush Landmark Telford Nab., MD;  Location: [facility];  Service: Gastroenterology;;  ? TONSILLECTOMY    ? ? ?Family History  ?Problem Relation Age of Onset  ? Diabetes Mother   ? Heart attack Mother 49  ?     2007  ? Hypertension Mother   ? Hyperlipidemia Mother   ? Heart  disease Mother   ? Heart attack Father   ? Stroke Father 63  ? Hypertension Father   ? Hyperlipidemia Father   ? Cancer Sister   ?     Gastric cancer in 2013  ? Stomach cancer Sister   ?     in the cavity behind the stomach  ? Diabetes Sister   ? Diabetes Sister   ? Diabetes Sister   ? Migraines Daughter   ?     brain surgery  ? Migraines Daughter   ? Hypertension Son   ?     weight loss corrected that  ? Breast cancer Neg Hx   ? Colon cancer Neg Hx   ? Inflammatory bowel disease Neg Hx   ? Liver disease Neg Hx   ? Esophageal cancer Neg Hx   ? Pancreatic cancer Neg Hx   ? Rectal cancer Neg Hx   ? ? ?Social History  ? ?Socioeconomic History  ? Marital status: Divorced  ?  Spouse name: Not  on file  ? Number of children: 3  ? Years of education: doctorate  ? Highest education level: Not on file  ?Occupational History  ? Occupation: Teacher/retired  ?Tobacco Use  ? Smoking status: Never  ? Smokeless tobacco: Never  ?Vaping Use  ? Vaping Use: Never used  ?Substance and Sexual Activity  ? Alcohol use: No  ? Drug use: No  ? Sexual activity: Not Currently  ?Other Topics Concern  ? Not on file  ?Social History Narrative  ? Caffeine use-yes  ? Regular exercise-no  ? ?Social Determinants of Health  ? ?Financial Resource Strain: Not on file  ?Food Insecurity: Not on file  ?Transportation Needs: Not on file  ?Physical Activity: Not on file  ?Stress: Not on file  ?Social Connections: Not on file  ?Intimate Partner Violence: Not on file  ? ? ?Outpatient Medications Prior to Visit  ?Medication Sig Dispense Refill  ? amLODipine (NORVASC) 10 MG tablet TAKE 1 TABLET BY MOUTH EVERY DAY (Patient taking differently: Take 10 mg by mouth daily.) 90 tablet 3  ? aspirin EC 81 MG tablet Take 1 tablet (81 mg total) by mouth daily. Swallow whole. 30 tablet 11  ? atorvastatin (LIPITOR) 80 MG tablet TAKE 1 TABLET BY MOUTH DAILY AT 6 PM. (Patient taking differently: Take 80 mg by mouth at bedtime.) 90 tablet 3  ? carvedilol (COREG) 25 MG tablet  Take 2 tablets (50 mg total) by mouth 2 (two) times daily with a meal. Please make yearly appt with Dr. Harrington Challenger for January 2023 for future refills. Thank you 1st attempt 360 tablet 0  ? Cholecalciferol 50 MCG (2000 UT) TABS Take one tablet

## 2022-02-12 ENCOUNTER — Ambulatory Visit
Admission: RE | Admit: 2022-02-12 | Discharge: 2022-02-12 | Disposition: A | Discharge status: Home / Self Care | Payer: Medicare PPO | Source: Ambulatory Visit | Attending: Family Medicine | Admitting: Family Medicine

## 2022-02-12 ENCOUNTER — Ambulatory Visit
Admission: RE | Admit: 2022-02-12 | Discharge: 2022-02-12 | Disposition: A | Discharge status: Home / Self Care | Payer: Medicare PPO | Source: Ambulatory Visit | Attending: Nurse Practitioner | Admitting: Nurse Practitioner

## 2022-02-12 DIAGNOSIS — E2839 Other primary ovarian failure: Secondary | ICD-10-CM

## 2022-02-12 DIAGNOSIS — Z780 Asymptomatic menopausal state: Secondary | ICD-10-CM | POA: Diagnosis not present

## 2022-02-12 DIAGNOSIS — Z1231 Encounter for screening mammogram for malignant neoplasm of breast: Secondary | ICD-10-CM | POA: Diagnosis not present

## 2022-02-25 ENCOUNTER — Other Ambulatory Visit: Payer: Self-pay | Admitting: Internal Medicine

## 2022-02-25 DIAGNOSIS — I10 Essential (primary) hypertension: Secondary | ICD-10-CM

## 2022-03-14 ENCOUNTER — Other Ambulatory Visit: Payer: Self-pay | Admitting: Nurse Practitioner

## 2022-03-14 ENCOUNTER — Other Ambulatory Visit: Payer: Self-pay | Admitting: Adult Health

## 2022-03-14 ENCOUNTER — Other Ambulatory Visit: Payer: Self-pay | Admitting: Internal Medicine

## 2022-03-14 ENCOUNTER — Other Ambulatory Visit: Payer: Self-pay | Admitting: Family Medicine

## 2022-03-14 DIAGNOSIS — I10 Essential (primary) hypertension: Secondary | ICD-10-CM

## 2022-03-14 DIAGNOSIS — E119 Type 2 diabetes mellitus without complications: Secondary | ICD-10-CM

## 2022-03-19 ENCOUNTER — Encounter (HOSPITAL_COMMUNITY): Payer: Self-pay

## 2022-03-19 ENCOUNTER — Encounter: Payer: Self-pay | Admitting: Physician Assistant

## 2022-04-04 ENCOUNTER — Ambulatory Visit (INDEPENDENT_AMBULATORY_CARE_PROVIDER_SITE_OTHER)
Admission: RE | Admit: 2022-04-04 | Discharge: 2022-04-04 | Disposition: A | Discharge status: Home / Self Care | Payer: Medicare PPO | Source: Ambulatory Visit | Attending: Nurse Practitioner | Admitting: Nurse Practitioner

## 2022-04-04 ENCOUNTER — Ambulatory Visit: Payer: Medicare PPO | Admitting: Nurse Practitioner

## 2022-04-04 ENCOUNTER — Telehealth: Payer: Self-pay

## 2022-04-04 VITALS — BP 110/78 | HR 68 | Temp 96.9°F | Resp 14 | Ht 65.0 in | Wt 293.5 lb

## 2022-04-04 DIAGNOSIS — R0989 Other specified symptoms and signs involving the circulatory and respiratory systems: Secondary | ICD-10-CM

## 2022-04-04 DIAGNOSIS — R1319 Other dysphagia: Secondary | ICD-10-CM

## 2022-04-04 DIAGNOSIS — E119 Type 2 diabetes mellitus without complications: Secondary | ICD-10-CM | POA: Diagnosis not present

## 2022-04-04 DIAGNOSIS — R09A2 Foreign body sensation, throat: Secondary | ICD-10-CM

## 2022-04-04 LAB — COMPREHENSIVE METABOLIC PANEL
ALT: 8 U/L (ref 0–35)
AST: 13 U/L (ref 0–37)
Albumin: 4.4 g/dL (ref 3.5–5.2)
Alkaline Phosphatase: 86 U/L (ref 39–117)
BUN: 45 mg/dL — ABNORMAL HIGH (ref 6–23)
CO2: 28 mEq/L (ref 19–32)
Calcium: 9.6 mg/dL (ref 8.4–10.5)
Chloride: 102 mEq/L (ref 96–112)
Creatinine, Ser: 3.07 mg/dL — ABNORMAL HIGH (ref 0.40–1.20)
GFR: 14.44 mL/min — CL (ref >60.00)
Glucose, Bld: 51 mg/dL — ABNORMAL LOW (ref 70–99)
Potassium: 5.2 mEq/L — ABNORMAL HIGH (ref 3.5–5.1)
Sodium: 140 mEq/L (ref 135–145)
Total Bilirubin: 0.5 mg/dL (ref 0.2–1.2)
Total Protein: 6.8 g/dL (ref 6.0–8.3)

## 2022-04-04 LAB — CBC
HCT: 32.9 % — ABNORMAL LOW (ref 36.0–46.0)
Hemoglobin: 10.4 g/dL — ABNORMAL LOW (ref 12.0–15.0)
MCHC: 31.8 g/dL (ref 30.0–36.0)
MCV: 71.8 fl — ABNORMAL LOW (ref 78.0–100.0)
Platelets: 224 10*3/uL (ref 150.0–400.0)
RBC: 4.58 Mil/uL (ref 3.87–5.11)
RDW: 14.7 % (ref 11.5–15.5)
WBC: 4.7 10*3/uL (ref 4.0–10.5)

## 2022-04-04 LAB — TSH: TSH: 0.99 u[IU]/mL (ref 0.35–5.50)

## 2022-04-04 LAB — MICROALBUMIN / CREATININE URINE RATIO
Creatinine,U: 87.4 mg/dL
Microalb Creat Ratio: 0.8 mg/g (ref 0.0–30.0)
Microalb, Ur: <0.7 mg/dL (ref 0.0–1.9)

## 2022-04-04 LAB — POCT GLYCOSYLATED HEMOGLOBIN (HGB A1C): Hemoglobin A1C: 6.1 % — AB (ref 4.0–5.6)

## 2022-04-04 MED ORDER — GLIPIZIDE 5 MG PO TABS: 5.0000 mg | ORAL_TABLET | Freq: Two times a day (BID) | ORAL | 3 refills | Status: DC

## 2022-04-04 NOTE — Telephone Encounter (Signed)
[facility] called critical results  GFR 14.44 ?Results given to [facility]. ?

## 2022-04-04 NOTE — Assessment & Plan Note (Signed)
Seems to be the esophageal phase with dysphagia.  We will do basic labs and x-ray in office.  If all comes back benign will refer to GI for further work-up patient would like to be seen in [place] if needs to go to GI ?

## 2022-04-04 NOTE — Assessment & Plan Note (Signed)
Patient progressing well with diabetes.  A1c of 6.1 in office today.  We will titrate her down her medications.  We will change glipizide 10 twice daily to glipizide 5 twice daily.  Patient made aware new prescription sent to pharmacy.  Continue checking glucose daily follow-up 3 months ?

## 2022-04-04 NOTE — Patient Instructions (Signed)
Nice to see you today ?I will be in touch with the labs and xray once I have them ?Follow up with me in 4 months, sooner if you need me ?

## 2022-04-04 NOTE — Assessment & Plan Note (Signed)
Is like something in her throat that the food gets hung on.  Patient has no difficulty swallowing own secretions or liquids.  We will do soft tissue neck x-ray in office today along with TSH ?

## 2022-04-04 NOTE — Telephone Encounter (Signed)
noted 

## 2022-04-04 NOTE — Progress Notes (Signed)
? ?Established Patient Office Visit ? ?Subjective   ?Patient ID: Alicia [name], female    DOB: 1946/05/06  Age: 76 y.o. MRN: [id number] ? ?Chief Complaint  ?Patient presents with  ? Swallowing trouble  ?  Sx started about 1 1/2 weeks ago and has gotten worse some. Noticed that when she is eating or drinking she feels like its getting stuck on her throat and then it makes her cough and then from coughing so much makes her feel out of breath and lightheaded.   ? ? ?HPI ? ?DM: once a day checking her glucose at home. States that her highest number was 186. Lowest number was 62. She was able to get herself something to eat or drink and get her glucose to come up ? ?Difficulty swallowing:  States that in the beginning. She thought that she was not chewing her food. States that she will start coughing and then lose her breath. States that it feels like there is something in her throat and getting caught on it. ? ? ? ? ?Review of Systems  ?Constitutional:  Negative for chills and fever.  ?HENT:  Negative for sore throat.   ?Respiratory:  Positive for cough (After she getsdone trying to eat foods). Negative for shortness of breath.   ?Cardiovascular:  Negative for chest pain.  ?Gastrointestinal:   ?     BM daily ? ?Difficulty swallowing foods  ?Genitourinary:  Negative for dysuria and hematuria.  ? ?  ?Objective:  ?  ? ?BP 110/78   Pulse 68   Temp (!) 96.9 ?F (36.1 ?C)   Resp 14   Ht '5\' 5"'$  (1.651 m)   Wt 293 lb 8 oz (133.1 kg)   SpO2 98%   BMI 48.84 kg/m?  ?BP Readings from Last 3 Encounters:  ?04/04/22 110/78  ?02/05/22 128/72  ?02/02/22 135/84  ? ?Wt Readings from Last 3 Encounters:  ?04/04/22 293 lb 8 oz (133.1 kg)  ?02/05/22 (!) 304 lb 4 oz (138 kg)  ?12/25/21 (!) 307 lb 2 oz (139.3 kg)  ? ?  ? ?Physical Exam ?Vitals and nursing note reviewed.  ?Constitutional:   ?   Appearance: Normal appearance. She is obese.  ?HENT:  ?   Right Ear: Ear canal and external ear normal. There is impacted cerumen.  ?   Left Ear:  Tympanic membrane, ear canal and external ear normal. There is no impacted cerumen.  ?   Mouth/Throat:  ?   Mouth: Mucous membranes are moist.  ?   Pharynx: Oropharynx is clear.  ?Eyes:  ?   Extraocular Movements: Extraocular movements intact.  ?   Pupils: Pupils are equal, round, and reactive to light.  ?   Comments: Wears glasses ?  ?Neck:  ?   Thyroid: No thyroid mass, thyromegaly or thyroid tenderness.  ?Cardiovascular:  ?   Rate and Rhythm: Normal rate and regular rhythm.  ?   Heart sounds: Normal heart sounds.  ?Pulmonary:  ?   Effort: Pulmonary effort is normal.  ?   Breath sounds: Normal breath sounds.  ?Musculoskeletal:  ?   Right lower leg: No edema.  ?   Left lower leg: No edema.  ?Lymphadenopathy:  ?   Cervical: No cervical adenopathy.  ?Skin: ?   General: Skin is warm.  ?Neurological:  ?   General: No focal deficit present.  ?   Mental Status: She is alert.  ? ? ? ?Results for orders placed or performed in visit on 04/04/22  ?  POCT glycosylated hemoglobin (Hb A1C)  ?Result Value Ref Range  ? Hemoglobin A1C 6.1 (A) 4.0 - 5.6 %  ? HbA1c POC (<> result, manual entry)    ? HbA1c, POC (prediabetic range)    ? HbA1c, POC (controlled diabetic range)    ? ? ? ? ?The ASCVD Risk score (Arnett DK, et al., 2019) failed to calculate for the following reasons: ?  The patient has a prior MI or stroke diagnosis ? ?  ?Assessment & Plan:  ? ?Problem List Items Addressed This Visit   ? ?  ? Digestive  ? Dysphagia  ?  Seems to be the esophageal phase with dysphagia.  We will do basic labs and x-ray in office.  If all comes back benign will refer to GI for further work-up patient would like to be seen in [place] if needs to go to GI ? ?  ?  ? Relevant Orders  ? DG Neck Soft Tissue  ? TSH  ?  ? Endocrine  ? Type 2 diabetes mellitus without complication, without long-term current use of insulin ([place]) - Primary  ?  Patient progressing well with diabetes.  A1c of 6.1 in office today.  We will titrate her down her medications.   We will change glipizide 10 twice daily to glipizide 5 twice daily.  Patient made aware new prescription sent to pharmacy.  Continue checking glucose daily follow-up 3 months ? ?  ?  ? Relevant Medications  ? glipiZIDE (GLUCOTROL) 5 MG tablet  ? Other Relevant Orders  ? POCT glycosylated hemoglobin (Hb A1C) (Completed)  ? Microalbumin/Creatinine Ratio, Urine  ? CBC  ? Comprehensive metabolic panel  ?  ? Other  ? Globus sensation  ?  Is like something in her throat that the food gets hung on.  Patient has no difficulty swallowing own secretions or liquids.  We will do soft tissue neck x-ray in office today along with TSH ? ?  ?  ? Relevant Orders  ? DG Neck Soft Tissue  ? TSH  ? ? ?Return in about 4 months (around 08/05/2022) for DM check.  ? ? ?Romilda Garret, NP ? ?

## 2022-04-06 ENCOUNTER — Other Ambulatory Visit: Payer: Self-pay | Admitting: Nurse Practitioner

## 2022-04-17 ENCOUNTER — Other Ambulatory Visit: Payer: Self-pay

## 2022-04-17 ENCOUNTER — Observation Stay (HOSPITAL_COMMUNITY): Payer: Medicare PPO

## 2022-04-17 ENCOUNTER — Emergency Department (HOSPITAL_COMMUNITY): Payer: Medicare PPO

## 2022-04-17 ENCOUNTER — Observation Stay (HOSPITAL_COMMUNITY)
Admission: EM | Admit: 2022-04-17 | Discharge: 2022-04-18 | Disposition: A | Discharge status: Home / Self Care | Payer: Medicare PPO | Attending: Internal Medicine | Admitting: Internal Medicine

## 2022-04-17 ENCOUNTER — Encounter (HOSPITAL_COMMUNITY): Payer: Self-pay

## 2022-04-17 DIAGNOSIS — Z955 Presence of coronary angioplasty implant and graft: Secondary | ICD-10-CM | POA: Diagnosis not present

## 2022-04-17 DIAGNOSIS — N1330 Unspecified hydronephrosis: Secondary | ICD-10-CM | POA: Insufficient documentation

## 2022-04-17 DIAGNOSIS — E11649 Type 2 diabetes mellitus with hypoglycemia without coma: Secondary | ICD-10-CM | POA: Diagnosis not present

## 2022-04-17 DIAGNOSIS — I10 Essential (primary) hypertension: Secondary | ICD-10-CM | POA: Diagnosis present

## 2022-04-17 DIAGNOSIS — N179 Acute kidney failure, unspecified: Secondary | ICD-10-CM | POA: Diagnosis not present

## 2022-04-17 DIAGNOSIS — I129 Hypertensive chronic kidney disease with stage 1 through stage 4 chronic kidney disease, or unspecified chronic kidney disease: Secondary | ICD-10-CM | POA: Diagnosis not present

## 2022-04-17 DIAGNOSIS — D631 Anemia in chronic kidney disease: Secondary | ICD-10-CM | POA: Diagnosis not present

## 2022-04-17 DIAGNOSIS — Z6841 Body Mass Index (BMI) 40.0 and over, adult: Secondary | ICD-10-CM | POA: Diagnosis not present

## 2022-04-17 DIAGNOSIS — N184 Chronic kidney disease, stage 4 (severe): Secondary | ICD-10-CM

## 2022-04-17 DIAGNOSIS — Z7984 Long term (current) use of oral hypoglycemic drugs: Secondary | ICD-10-CM | POA: Insufficient documentation

## 2022-04-17 DIAGNOSIS — E1122 Type 2 diabetes mellitus with diabetic chronic kidney disease: Secondary | ICD-10-CM | POA: Diagnosis not present

## 2022-04-17 DIAGNOSIS — E66813 Obesity, class 3: Secondary | ICD-10-CM

## 2022-04-17 DIAGNOSIS — E785 Hyperlipidemia, unspecified: Secondary | ICD-10-CM | POA: Diagnosis present

## 2022-04-17 DIAGNOSIS — Z8673 Personal history of transient ischemic attack (TIA), and cerebral infarction without residual deficits: Secondary | ICD-10-CM | POA: Insufficient documentation

## 2022-04-17 DIAGNOSIS — Z85038 Personal history of other malignant neoplasm of large intestine: Secondary | ICD-10-CM | POA: Diagnosis not present

## 2022-04-17 DIAGNOSIS — Z7982 Long term (current) use of aspirin: Secondary | ICD-10-CM | POA: Diagnosis not present

## 2022-04-17 DIAGNOSIS — R4781 Slurred speech: Secondary | ICD-10-CM | POA: Diagnosis not present

## 2022-04-17 DIAGNOSIS — Z79899 Other long term (current) drug therapy: Secondary | ICD-10-CM | POA: Insufficient documentation

## 2022-04-17 DIAGNOSIS — E162 Hypoglycemia, unspecified: Secondary | ICD-10-CM | POA: Diagnosis not present

## 2022-04-17 DIAGNOSIS — E161 Other hypoglycemia: Secondary | ICD-10-CM | POA: Diagnosis not present

## 2022-04-17 DIAGNOSIS — E875 Hyperkalemia: Secondary | ICD-10-CM | POA: Diagnosis not present

## 2022-04-17 DIAGNOSIS — Z9861 Coronary angioplasty status: Secondary | ICD-10-CM | POA: Diagnosis not present

## 2022-04-17 DIAGNOSIS — I2510 Atherosclerotic heart disease of native coronary artery without angina pectoris: Secondary | ICD-10-CM | POA: Diagnosis not present

## 2022-04-17 DIAGNOSIS — E119 Type 2 diabetes mellitus without complications: Secondary | ICD-10-CM

## 2022-04-17 DIAGNOSIS — Z7902 Long term (current) use of antithrombotics/antiplatelets: Secondary | ICD-10-CM | POA: Insufficient documentation

## 2022-04-17 LAB — COMPREHENSIVE METABOLIC PANEL
ALT: 12 U/L (ref 0–44)
AST: 15 U/L (ref 15–41)
Albumin: 3.6 g/dL (ref 3.5–5.0)
Alkaline Phosphatase: 75 U/L (ref 38–126)
Anion gap: 8 (ref 5–15)
BUN: 47 mg/dL — ABNORMAL HIGH (ref 8–23)
CO2: 25 mmol/L (ref 22–32)
Calcium: 9.2 mg/dL (ref 8.9–10.3)
Chloride: 104 mmol/L (ref 98–111)
Creatinine, Ser: 3 mg/dL — ABNORMAL HIGH (ref 0.44–1.00)
GFR, Estimated: 16 mL/min — ABNORMAL LOW (ref >60)
Glucose, Bld: 195 mg/dL — ABNORMAL HIGH (ref 70–99)
Potassium: 5.8 mmol/L — ABNORMAL HIGH (ref 3.5–5.1)
Sodium: 137 mmol/L (ref 135–145)
Total Bilirubin: 0.5 mg/dL (ref 0.3–1.2)
Total Protein: 6.6 g/dL (ref 6.5–8.1)

## 2022-04-17 LAB — GLUCOSE, CAPILLARY
Glucose-Capillary: 170 mg/dL — ABNORMAL HIGH (ref 70–99)
Glucose-Capillary: 148 mg/dL — ABNORMAL HIGH (ref 70–99)

## 2022-04-17 LAB — URINALYSIS, ROUTINE W REFLEX MICROSCOPIC
Bilirubin Urine: NEGATIVE
Glucose, UA: NEGATIVE mg/dL
Hgb urine dipstick: NEGATIVE
Ketones, ur: NEGATIVE mg/dL
Leukocytes,Ua: NEGATIVE
Nitrite: NEGATIVE
Protein, ur: NEGATIVE mg/dL
Specific Gravity, Urine: 1.005 (ref 1.005–1.030)
pH: 6 (ref 5.0–8.0)

## 2022-04-17 LAB — CBC WITH DIFFERENTIAL/PLATELET
Abs Immature Granulocytes: 0.01 10*3/uL (ref 0.00–0.07)
Basophils Absolute: 0 10*3/uL (ref 0.0–0.1)
Basophils Relative: 0 %
Eosinophils Absolute: 0.1 10*3/uL (ref 0.0–0.5)
Eosinophils Relative: 2 %
HCT: 32.7 % — ABNORMAL LOW (ref 36.0–46.0)
Hemoglobin: 9.9 g/dL — ABNORMAL LOW (ref 12.0–15.0)
Immature Granulocytes: 0 %
Lymphocytes Relative: 31 %
Lymphs Abs: 1.4 10*3/uL (ref 0.7–4.0)
MCH: 23.1 pg — ABNORMAL LOW (ref 26.0–34.0)
MCHC: 30.3 g/dL (ref 30.0–36.0)
MCV: 76.2 fL — ABNORMAL LOW (ref 80.0–100.0)
Monocytes Absolute: 0.3 10*3/uL (ref 0.1–1.0)
Monocytes Relative: 7 %
Neutro Abs: 2.7 10*3/uL (ref 1.7–7.7)
Neutrophils Relative %: 60 %
Platelets: 218 10*3/uL (ref 150–400)
RBC: 4.29 MIL/uL (ref 3.87–5.11)
RDW: 14.6 % (ref 11.5–15.5)
WBC: 4.6 10*3/uL (ref 4.0–10.5)
nRBC: 0 % (ref 0.0–0.2)

## 2022-04-17 LAB — CBG MONITORING, ED
Glucose-Capillary: 109 mg/dL — ABNORMAL HIGH (ref 70–99)
Glucose-Capillary: 42 mg/dL — CL (ref 70–99)
Glucose-Capillary: 160 mg/dL — ABNORMAL HIGH (ref 70–99)
Glucose-Capillary: 87 mg/dL (ref 70–99)

## 2022-04-17 MED ORDER — ONDANSETRON HCL 4 MG/2ML IJ SOLN: 4.0000 mg | Freq: Four times a day (QID) | INTRAMUSCULAR | Status: DC | PRN

## 2022-04-17 MED ORDER — ACETAMINOPHEN 650 MG RE SUPP: 650.0000 mg | Freq: Four times a day (QID) | RECTAL | Status: DC | PRN

## 2022-04-17 MED ORDER — SODIUM ZIRCONIUM CYCLOSILICATE 10 G PO PACK
10.0000 g | PACK | Freq: Three times a day (TID) | ORAL | Status: DC
  Administered 2022-04-17: 10 g via ORAL
  Filled 2022-04-17 (×2): qty 1

## 2022-04-17 MED ORDER — INSULIN ASPART 100 UNIT/ML IJ SOLN
0.0000 [IU] | Freq: Three times a day (TID) | INTRAMUSCULAR | Status: DC
  Administered 2022-04-18: 1 [IU] via SUBCUTANEOUS
  Filled 2022-04-17: qty 0.06

## 2022-04-17 MED ORDER — NITROGLYCERIN 0.4 MG SL SUBL
0.4000 mg | SUBLINGUAL_TABLET | SUBLINGUAL | Status: DC | PRN
Start: 2022-04-17 — End: 2022-04-18

## 2022-04-17 MED ORDER — ISOSORBIDE MONONITRATE ER 60 MG PO TB24
60.0000 mg | ORAL_TABLET | Freq: Every day | ORAL | Status: DC
  Administered 2022-04-18: 60 mg via ORAL
  Filled 2022-04-17: qty 1

## 2022-04-17 MED ORDER — HEPARIN SODIUM (PORCINE) 5000 UNIT/ML IJ SOLN
5000.0000 [IU] | Freq: Three times a day (TID) | INTRAMUSCULAR | Status: DC
  Administered 2022-04-17 – 2022-04-18 (×2): 5000 [IU] via SUBCUTANEOUS
  Filled 2022-04-17 (×3): qty 1

## 2022-04-17 MED ORDER — LACTATED RINGERS IV SOLN: INTRAVENOUS | Status: DC

## 2022-04-17 MED ORDER — DEXTROSE 50 % IV SOLN: 25.0000 g | Freq: Once | INTRAVENOUS | Status: AC

## 2022-04-17 MED ORDER — VITAMIN B-12 1000 MCG PO TABS
1000.0000 ug | ORAL_TABLET | Freq: Every day | ORAL | Status: DC
  Administered 2022-04-18: 1000 ug via ORAL
  Filled 2022-04-17: qty 1

## 2022-04-17 MED ORDER — AMLODIPINE BESYLATE 5 MG PO TABS
10.0000 mg | ORAL_TABLET | Freq: Every day | ORAL | Status: DC
  Administered 2022-04-17: 10 mg via ORAL
  Filled 2022-04-17: qty 2

## 2022-04-17 MED ORDER — SENNOSIDES-DOCUSATE SODIUM 8.6-50 MG PO TABS: 1.0000 | ORAL_TABLET | Freq: Every evening | ORAL | Status: DC | PRN

## 2022-04-17 MED ORDER — ONDANSETRON HCL 4 MG PO TABS: 4.0000 mg | ORAL_TABLET | Freq: Four times a day (QID) | ORAL | Status: DC | PRN

## 2022-04-17 MED ORDER — CLOPIDOGREL BISULFATE 75 MG PO TABS
75.0000 mg | ORAL_TABLET | Freq: Every morning | ORAL | Status: DC
  Administered 2022-04-18: 75 mg via ORAL
  Filled 2022-04-17: qty 1

## 2022-04-17 MED ORDER — DOXYLAMINE SUCCINATE (SLEEP) 25 MG PO TABS
25.0000 mg | ORAL_TABLET | Freq: Every day | ORAL | Status: DC
  Administered 2022-04-17: 25 mg via ORAL
  Filled 2022-04-17: qty 1

## 2022-04-17 MED ORDER — DEXTROSE 50 % IV SOLN
INTRAVENOUS | Status: AC
  Administered 2022-04-17: 25 g via INTRAVENOUS
  Filled 2022-04-17: qty 50

## 2022-04-17 MED ORDER — ASPIRIN 81 MG PO TBEC
81.0000 mg | DELAYED_RELEASE_TABLET | Freq: Every day | ORAL | Status: DC
  Administered 2022-04-18: 81 mg via ORAL
  Filled 2022-04-17: qty 1

## 2022-04-17 MED ORDER — ACETAMINOPHEN 325 MG PO TABS: 650.0000 mg | ORAL_TABLET | Freq: Four times a day (QID) | ORAL | Status: DC | PRN

## 2022-04-17 MED ORDER — SODIUM ZIRCONIUM CYCLOSILICATE 10 G PO PACK
10.0000 g | PACK | ORAL | Status: AC
  Administered 2022-04-17: 10 g via ORAL
  Filled 2022-04-17: qty 1

## 2022-04-17 MED ORDER — CARVEDILOL 12.5 MG PO TABS
50.0000 mg | ORAL_TABLET | Freq: Two times a day (BID) | ORAL | Status: DC
  Administered 2022-04-17 – 2022-04-18 (×2): 50 mg via ORAL
  Filled 2022-04-17 (×2): qty 4

## 2022-04-17 MED ORDER — ALBUTEROL SULFATE (2.5 MG/3ML) 0.083% IN NEBU: 2.5000 mg | INHALATION_SOLUTION | RESPIRATORY_TRACT | Status: DC | PRN

## 2022-04-17 NOTE — H&P (Signed)
History and Physical    Alicia [name] UGQ:[id number] DOB: [birth year] DOA: 04/17/2022  PCP: Michela Pitcher, NP   Patient coming from: Home  I have personally briefly reviewed patient's old medical records in [place]  Chief Complaint: Low blood sugar  HPI: Alicia [name] is a 76 y.o. female with medical history significant of diabetes mellitus type 2, hypertension, hyperlipidemia, CAD status post stenting, remote right basal ganglia stroke without residual deficit in March 2020, morbid obesity presents with low blood sugars.  Apparently, she has low blood sugars; she is on Nutrisystem diet plan.  Her blood sugar was 42 today; EMS was called and she was given oral glucose.  Her dose of glipizide was recently decreased.  Patient denies any fever, nausea, vomiting, chest pain, diarrhea, worsening abdominal pain, dysuria, increased frequency of urination, nausea consciousness or seizures.    ED Course: Initial blood glucose was 42.  Patient was given IV dextrose.  Potassium of 5.2 with creatinine of 3.07; repeat potassium of 5.8 and creatinine of 3.  EKG did not show peaked T waves.  ED provider spoke to Dr. Schertz/nephrology who recommended [facility], IV fluids and to discontinue Lasix/spironolactone and losartan. Hospitalist service was called to evaluate the patient.  Review of Systems: As per HPI otherwise all other systems were reviewed and are negative.   Past Medical History:  Diagnosis Date   Anemia    Arthritis    "right knee" (10/21/2015)   CAD (coronary artery disease)    a. Cath 10/21/15: s/p DES to RCA: moderate diffuse stenosis of mod diagonal, diffuse irregularity LCx and LAD. 2D echo 10/21/15: mod LVH, EF 60-65%, no RWMA, calcified MV, mod-severe LAE. 03/18/17 PCI with DES--> 1 diag   Cataract    Diabetes ([place])    Essential hypertension    Family history of adverse reaction to anesthesia    "oldest sister, [name], related to brain formation at back of head; when they put  her to sleep it's hard to wake her up"   GERD (gastroesophageal reflux disease)    History of blood transfusion    "related to ruptured tubal pregnancy"   Hyperlipidemia    Hypertensive heart disease    Morbid obesity ([place])    Stroke (cerebrum) ([place])    Tubular adenoma of colon    Type II diabetes mellitus ([name])     Past Surgical History:  Procedure Laterality Date   [place]   BIOPSY  07/18/2020   Procedure: BIOPSY;  Surgeon: [name], MD;  Location: [facility];  Service: Gastroenterology;;   CARDIAC CATHETERIZATION N/A 10/21/2015   Procedure: Right/Left Heart Cath and Coronary Angiography;  Surgeon: [name], MD;  Location: [place] [facility];  Service: Cardiovascular;  Laterality: N/A;   CHOLECYSTECTOMY OPEN  1984   COLONOSCOPY WITH PROPOFOL N/A 07/18/2020   Procedure: COLONOSCOPY WITH PROPOFOL;  Surgeon: [name], MD;  Location: [facility];  Service: Gastroenterology;  Laterality: N/A;   CORONARY STENT INTERVENTION N/A 03/18/2017   Procedure: Coronary Stent Intervention;  Surgeon: [name], MD;  Location: [place] [facility];  Service: Cardiovascular;  Laterality: N/A;   CORONARY STENT PLACEMENT  03/18/2017   A OPTIMIZE STUDY Drug Eluting Stent (2.5 mm 18 mm - post-dilated to 2.7 mm) was successfully placed   ECTOPIC PREGNANCY SURGERY     ENDOSCOPIC MUCOSAL RESECTION N/A 01/23/2021   Procedure: ENDOSCOPIC  MUCOSAL RESECTION;  Surgeon: Rush Landmark Telford Nab., MD;  Location: [facility];  Service: Gastroenterology;  Laterality: N/A;   ESOPHAGOGASTRODUODENOSCOPY (EGD) WITH PROPOFOL N/A 07/18/2020   Procedure: ESOPHAGOGASTRODUODENOSCOPY (EGD) WITH PROPOFOL;  Surgeon: [name], MD;  [facility]: [facility];  Service: Gastroenterology;  Laterality: N/A;   ESOPHAGOGASTRODUODENOSCOPY (EGD) WITH PROPOFOL N/A 01/23/2021   Procedure: ESOPHAGOGASTRODUODENOSCOPY (EGD) WITH PROPOFOL;  Surgeon:  Rush Landmark Telford Nab., MD;  Location: [facility];  Service: Gastroenterology;  Laterality: N/A;   EUS N/A 01/23/2021   Procedure: UPPER ENDOSCOPIC ULTRASOUND (EUS) RADIAL;  Surgeon: Irving Copas., MD;  Location: [facility];  Service: Gastroenterology;  Laterality: N/A;   HEMOSTASIS CLIP PLACEMENT  01/23/2021   Procedure: HEMOSTASIS CLIP PLACEMENT;  Surgeon: Irving Copas., MD;  Location: [facility];  Service: Gastroenterology;;   INTRAVASCULAR PRESSURE WIRE/FFR STUDY N/A 09/04/2019   Procedure: INTRAVASCULAR PRESSURE WIRE/FFR STUDY;  Surgeon: Nelva Bush, MD;  Location: [place] [facility];  Service: Cardiovascular;  Laterality: N/A;   LEFT HEART CATH AND CORONARY ANGIOGRAPHY N/A 03/18/2017   Procedure: Left Heart Cath and Coronary Angiography;  Surgeon: [name], MD;  Location: [place] [facility];  Service: Cardiovascular;  Laterality: N/A;   LEFT HEART CATH AND CORONARY ANGIOGRAPHY N/A 09/04/2019   Procedure: LEFT HEART CATH AND CORONARY ANGIOGRAPHY;  Surgeon: Nelva Bush, MD;  Location: [place] [facility];  Service: Cardiovascular;  Laterality: N/A;   POLYPECTOMY  07/18/2020   Procedure: POLYPECTOMY;  Surgeon: [name], MD;  Location: [facility];  Service: Gastroenterology;;   REDUCTION MAMMAPLASTY Bilateral ~ [place] INJECTION  01/23/2021   Procedure: SUBMUCOSAL LIFTING INJECTION;  Surgeon: Irving Copas., MD;  Location: [facility];  Service: Gastroenterology;;   TONSILLECTOMY       reports that she has never smoked. She has never used smokeless tobacco. She reports that she does not drink alcohol and does not use drugs.  Allergies  Allergen Reactions   Iodinated Contrast Media Hives   Sulfa Antibiotics Hives    Family History  Problem Relation Age of Onset   Diabetes Mother    Heart attack Mother 109       2007   Hypertension Mother    Hyperlipidemia Mother    Heart disease Mother    Heart attack Father     Stroke Father 30   Hypertension Father    Hyperlipidemia Father    Cancer Sister        Gastric cancer in 2013   Stomach cancer Sister        in the cavity behind the stomach   Diabetes Sister    Diabetes Sister    Diabetes Sister    Migraines Daughter        brain surgery   Migraines Daughter    Hypertension Son        weight loss corrected that   Breast cancer Neg Hx    Colon cancer Neg Hx    Inflammatory bowel disease Neg Hx    Liver disease Neg Hx    Esophageal cancer Neg Hx    Pancreatic cancer Neg Hx    Rectal cancer Neg Hx     Prior to Admission medications   Medication Sig Start Date End Date Taking? Authorizing Provider  amLODipine (NORVASC) 10 MG tablet TAKE 1 TABLET BY MOUTH EVERY DAY Patient taking differently: Take 10 mg by mouth at bedtime. 03/14/22  Yes [name], MD  aspirin EC 81 MG tablet Take 1  tablet (81 mg total) by mouth daily. Swallow whole. 01/24/21  Yes Mansouraty, Telford Nab., MD  atorvastatin (LIPITOR) 80 MG tablet TAKE 1 TABLET BY MOUTH DAILY AT 6 PM. Patient taking differently: Take 80 mg by mouth every evening. 11/21/20  Yes [name], MD  carvedilol (COREG) 25 MG tablet Take 2 tablets (50 mg total) by mouth 2 (two) times daily with a meal. Please make yearly appt with Dr. Harrington Challenger for January 2023 for future refills. Thank you 1st attempt Patient taking differently: Take 50 mg by mouth 2 (two) times daily with a meal. 11/16/21  Yes [name], MD  Cholecalciferol (VITAMIN D3) 50 MCG (2000 UT) TABS Take 2,000 Units by mouth daily.   Yes [provider]  clopidogrel (PLAVIX) 75 MG tablet TAKE 1 TABLET BY MOUTH EVERY DAY Patient taking differently: Take 75 mg by mouth in the morning. 03/15/22  Yes [name], MD  doxylamine, Sleep, (UNISOM) 25 MG tablet Take 25 mg by mouth at bedtime.   Yes [provider]  Evolocumab (REPATHA SURECLICK) 093 MG/ML SOAJ INJECT 1 PEN INTO THE SKIN EVERY 14 (FOURTEEN) DAYS. Patient taking  differently: Inject 140 mg into the skin every 14 (fourteen) days. 12/05/21  Yes [name], MD  ferrous sulfate 325 (65 FE) MG tablet TAKE 1 TABLET BY MOUTH EVERY DAY WITH BREAKFAST Patient taking differently: Take 325 mg by mouth daily with breakfast. 03/19/22  Yes Dede Query T, PA-C  furosemide (LASIX) 40 MG tablet Take 40 mg by mouth in the morning. 11/22/21  Yes [provider]  glipiZIDE (GLUCOTROL) 5 MG tablet Take 1 tablet (5 mg total) by mouth 2 (two) times daily before a meal. 04/04/22  Yes Michela Pitcher, NP  isosorbide mononitrate (IMDUR) 60 MG 24 hr tablet Take 1 tablet (60 mg total) by mouth daily. Please make overdue appt with Dr. Harrington Challenger before anymore refills. Thank you 1st attempt Patient taking differently: Take 60 mg by mouth daily. 12/05/21  Yes [name], MD  losartan (COZAAR) 100 MG tablet TAKE 1 TABLET BY MOUTH EVERY DAY Patient taking differently: Take 100 mg by mouth in the morning. 03/15/22  Yes [name], MD  losartan (COZAAR) 50 MG tablet Take 1 tablet (50 mg total) by mouth daily. Patient taking differently: Take 50 mg by mouth in the morning. 12/14/20  Yes [name], MD  Magnesium 200 MG TABS Take 200 mg by mouth daily.   Yes [provider]  metFORMIN (GLUCOPHAGE) 1000 MG tablet TAKE 1 TABLET (1,000 MG TOTAL) BY MOUTH 2 (TWO) TIMES DAILY WITH A MEAL. 03/15/22  Yes Michela Pitcher, NP  nitroGLYCERIN (NITROSTAT) 0.4 MG SL tablet Place 1 tablet (0.4 mg total) under the tongue every 5 (five) minutes as needed for chest pain. 09/02/19  Yes Imogene Burn, PA-C  omeprazole (PRILOSEC) 40 MG capsule TAKE 1 CAPSULE BY MOUTH EVERY DAY Patient taking differently: Take 40 mg by mouth daily before breakfast. 12/04/21  Yes Michela Pitcher, NP  ondansetron (ZOFRAN-ODT) 4 MG disintegrating tablet TAKE 1 TABLET BY MOUTH EVERY 8 HOURS AS NEEDED FOR NAUSEA AND VOMITING Patient taking differently: Take 4 mg by mouth every 8 (eight) hours as needed for vomiting or  nausea (dissolve orally). 11/06/21  Yes Michela Pitcher, NP  spironolactone (ALDACTONE) 25 MG tablet TAKE 1 TABLET BY MOUTH EVERY DAY Patient taking differently: Take 25 mg by mouth in the morning. 02/26/22  Yes [name],  MD  vitamin B-12 (CYANOCOBALAMIN) 500 MCG tablet Take 2 tablets (1,000 mcg total) by mouth 2 (two) times daily. Patient taking differently: Take 1,000 mcg by mouth daily. 05/16/21  Yes Dutch Quint B, FNP  Cholecalciferol 50 MCG (2000 UT) TABS Take one tablet daily alternating with two tablets daily Patient not taking: Reported on 04/17/2022 02/16/21   [name], MD  glucose blood (ONETOUCH ULTRA) test strip 1 each by Other route 2 (two) times daily. LAST REFILL SCHEDULE PHYSICAL 01/18/20   Jearld Fenton, NP  linagliptin (TRADJENTA) 5 MG TABS tablet TAKE 1 TABLET (5 MG TOTAL) BY MOUTH DAILY. Patient not taking: Reported on 04/17/2022 03/29/21   Lesleigh Noe, MD  Vitamin D, Ergocalciferol, (DRISDOL) 1.25 MG (50000 UNIT) CAPS capsule Take 1 capsule (50,000 Units total) by mouth every 7 (seven) days. Please return for vit d level check on 02/10/21. Patient not taking: Reported on 04/17/2022 12/14/20   [name], MD    Physical Exam: Vitals:   04/17/22 1530 04/17/22 1545 04/17/22 1600 04/17/22 1615  BP: (!) 158/71 (!) 145/74 (!) 158/80 (!) 154/86  Pulse:  69  69  Resp: '11 11 16 14  '$ Temp:      TempSrc:      SpO2:  100%  100%  Weight:      Height:        Constitutional: NAD, calm, comfortable Vitals:   04/17/22 1530 04/17/22 1545 04/17/22 1600 04/17/22 1615  BP: (!) 158/71 (!) 145/74 (!) 158/80 (!) 154/86  Pulse:  69  69  Resp: '11 11 16 14  '$ Temp:      TempSrc:      SpO2:  100%  100%  Weight:      Height:       Eyes: PERRL, lids and conjunctivae normal ENMT: Mucous membranes are moist. Posterior pharynx clear of any exudate or lesions. Neck: normal, supple, no masses, no thyromegaly Respiratory: bilateral decreased breath sounds at bases, no wheezing, no  crackles. Normal respiratory effort. No accessory muscle use.  Cardiovascular: S1 S2 positive, rate controlled.  Lower extremity edema present.  2+ pedal pulses.  Abdomen: no tenderness, no masses palpated. No hepatosplenomegaly. Bowel sounds positive.  Musculoskeletal: no clubbing / cyanosis. No joint deformity upper and lower extremities.  Skin: no rashes, lesions, ulcers. No induration Neurologic: CN 2-12 grossly intact. Moving extremities. No focal neurologic deficits.  Psychiatric: Normal judgment and insight. Alert and oriented x 3. Normal mood.    Labs on Admission: I have personally reviewed following labs and imaging studies  CBC: Recent Labs  Lab 04/17/22 1357  WBC 4.6  NEUTROABS 2.7  HGB 9.9*  HCT 32.7*  MCV 76.2*  PLT 102   Basic Metabolic Panel: Recent Labs  Lab 04/17/22 1357  NA 137  K 5.8*  CL 104  CO2 25  GLUCOSE 195*  BUN 47*  CREATININE 3.00*  CALCIUM 9.2   GFR: Estimated Creatinine Clearance: 22.4 mL/min (A) (by C-G formula based on SCr of 3 mg/dL (H)). Liver Function Tests: Recent Labs  Lab 04/17/22 1357  AST 15  ALT 12  ALKPHOS 75  BILITOT 0.5  PROT 6.6  ALBUMIN 3.6   No results for input(s): LIPASE, AMYLASE in the last 168 hours. No results for input(s): AMMONIA in the last 168 hours. Coagulation Profile: No results for input(s): INR, PROTIME in the last 168 hours. Cardiac Enzymes: No results for input(s): CKTOTAL, CKMB, CKMBINDEX, TROPONINI in the last 168 hours. BNP (last 3  results) Recent Labs    09/05/21 1211 11/15/21 1213 11/17/21 1246  PROBNP 174.0* 200.0* 107.0*   HbA1C: No results for input(s): HGBA1C in the last 72 hours. CBG: Recent Labs  Lab 04/17/22 1057 04/17/22 1120 04/17/22 1224  GLUCAP 42* 109* 160*   Lipid Profile: No results for input(s): CHOL, HDL, LDLCALC, TRIG, CHOLHDL, LDLDIRECT in the last 72 hours. Thyroid Function Tests: No results for input(s): TSH, T4TOTAL, FREET4, T3FREE, THYROIDAB in the last  72 hours. Anemia Panel: No results for input(s): VITAMINB12, FOLATE, FERRITIN, TIBC, IRON, RETICCTPCT in the last 72 hours. Urine analysis:    Component Value Date/Time   COLORURINE YELLOW 04/17/2022 [place] 04/17/2022 1403   LABSPEC 1.005 04/17/2022 1403   PHURINE 6.0 04/17/2022 1403   GLUCOSEU NEGATIVE 04/17/2022 1403   [place] 04/17/2022 [place] 04/17/2022 [place] 04/17/2022 [place] 04/17/2022 1403   UROBILINOGEN 0.2 10/12/2011 1726   NITRITE NEGATIVE 04/17/2022 [place] 04/17/2022 1403    Radiological Exams on Admission: DG Chest Port 1 View  Result Date: 04/17/2022 CLINICAL DATA:  Pt bib ems for hypoglycemia. Pt's cbg upon ems arrival 59, pt given 2 oral glucose, repeat cbg 65. Upon arrival to ED pt's cbg 42. Pt given OJ w/ sugar during triage EXAM: PORTABLE CHEST - 1 VIEW COMPARISON:  11/15/2021 FINDINGS: Lungs are clear. Heart size upper limits normal for technique. Aortic Atherosclerosis (ICD10-170.0). No effusion. Visualized bones unremarkable. IMPRESSION: No acute cardiopulmonary disease. Electronically Signed   By: Lucrezia Europe M.D.   On: 04/17/2022 16:14    EKG: Independently reviewed.  Did not show any T wave abnormalities  Assessment/Plan  AKI on CKD stage IV Hyperkalemia -Baseline creatinine fluctuating 2.3-2.8.  Creatinine 3 on presentation, creatinine was 2.07 on 04/04/2022 -Potassium 5.8 on presentation.  No EKG changes.  ED provider spoke to Dr. Schertz/nephrology who recommended Lokelma 10 g 3 times daily until potassium is less than 5 along with IV fluids and to discontinue spironolactone/Lasix and losartan -Continue IV fluids.  Repeat a.m. labs.    Hypoglycemia Diabetes mellitus type 2 -Blood glucose 42 on presentation.  Improved with IV dextrose.  Glipizide was apparently recently decreased in dose. Hold oral meds.  Continue CBGs with  SSI.  Hypertension -Monitor blood pressure.  Continue Coreg, amlodipine, Imdur.  Hold losartan, Lasix and spironolactone  Anemia of chronic disease -From renal failure.  Hemoglobin stable.  Monitor  Morbid obesity -Outpatient follow-up  CAD status post stenting Hyperlipidemia -continue current home regimen including aspirin, statin and Plavix.  Outpatient follow-up with cardiology   DVT prophylaxis: Heparin subcutaneous Code Status: Full Family Communication: None at bedside Disposition Plan: Home in 1 to 2 days once clinically improved Consults called: ED provider spoke to nephrology/Dr. Jonnie Finner Admission status: Observation/telemetry  Severity of Illness: The appropriate patient status for this patient is OBSERVATION. Observation status is judged to be reasonable and necessary in order to provide the required intensity of service to ensure the patient's safety. The patient's presenting symptoms, physical exam findings, and initial radiographic and laboratory data in the context of their medical condition is felt to place them at decreased risk for further clinical deterioration. Furthermore, it is anticipated that the patient will be medically stable for discharge from the hospital within 2 midnights of admission.     Aline August MD [facility]  04/17/2022, 4:50 PM

## 2022-04-17 NOTE — ED Provider Notes (Signed)
[facility] DEPT Provider Note   CSN: [id number] Arrival date & time: 04/17/22  1050     History  Chief Complaint  Patient presents with   Hypoglycemia    Alicia [name] is a 76 y.o. female.  76 year old female presents with hyperglycemia.  Patient states that normally she has low blood sugars in the morning.  She is on [employer] plan.  States that today her sugar was 42.  EMS was called and was given to oral glucoses.  Notes that recently she had her glipizide discontinued.  Denies any recent illnesses.      Home Medications Prior to Admission medications   Medication Sig Start Date End Date Taking? Authorizing Provider  amLODipine (NORVASC) 10 MG tablet TAKE 1 TABLET BY MOUTH EVERY DAY 03/14/22   [name], MD  aspirin EC 81 MG tablet Take 1 tablet (81 mg total) by mouth daily. Swallow whole. 01/24/21   Mansouraty, Telford Nab., MD  atorvastatin (LIPITOR) 80 MG tablet TAKE 1 TABLET BY MOUTH DAILY AT 6 PM. Patient taking differently: Take 80 mg by mouth at bedtime. 11/21/20   [name], MD  carvedilol (COREG) 25 MG tablet Take 2 tablets (50 mg total) by mouth 2 (two) times daily with a meal. Please make yearly appt with Dr. Harrington Challenger for January 2023 for future refills. Thank you 1st attempt 11/16/21   [name], MD  Cholecalciferol 50 MCG (2000 UT) TABS Take one tablet daily alternating with two tablets daily 02/16/21   [name], MD  clopidogrel (PLAVIX) 75 MG tablet TAKE 1 TABLET BY MOUTH EVERY DAY 03/15/22   [name], MD  doxylamine, Sleep, (UNISOM) 25 MG tablet Take 25 mg by mouth at bedtime.    [provider]  Evolocumab (REPATHA SURECLICK) 939 MG/ML SOAJ INJECT 1 PEN INTO THE SKIN EVERY 14 (FOURTEEN) DAYS. 12/05/21   [name], MD  ferrous sulfate 325 (65 FE) MG tablet TAKE 1 TABLET BY MOUTH EVERY DAY WITH BREAKFAST 03/19/22   Dede Query T, PA-C  furosemide (LASIX) 40 MG tablet Take 40 mg by mouth daily. 11/22/21    [provider]  glipiZIDE (GLUCOTROL) 5 MG tablet Take 1 tablet (5 mg total) by mouth 2 (two) times daily before a meal. 04/04/22   Michela Pitcher, NP  glucose blood (ONETOUCH ULTRA) test strip 1 each by Other route 2 (two) times daily. LAST REFILL SCHEDULE PHYSICAL 01/18/20   Jearld Fenton, NP  isosorbide mononitrate (IMDUR) 60 MG 24 hr tablet Take 1 tablet (60 mg total) by mouth daily. Please make overdue appt with Dr. Harrington Challenger before anymore refills. Thank you 1st attempt 12/05/21   [name], MD  linagliptin (TRADJENTA) 5 MG TABS tablet TAKE 1 TABLET (5 MG TOTAL) BY MOUTH DAILY. Patient taking differently: Take 5 mg by mouth daily. 03/29/21   Lesleigh Noe, MD  losartan (COZAAR) 100 MG tablet TAKE 1 TABLET BY MOUTH EVERY DAY 03/15/22   [name], MD  losartan (COZAAR) 50 MG tablet Take 1 tablet (50 mg total) by mouth daily. 12/14/20   [name], MD  Magnesium 200 MG TABS Take 200 mg by mouth daily.    [provider]  metFORMIN (GLUCOPHAGE) 1000 MG tablet TAKE 1 TABLET (1,000 MG TOTAL) BY MOUTH 2 (TWO) TIMES DAILY WITH A MEAL. 03/15/22   Michela Pitcher, NP  nitroGLYCERIN (NITROSTAT) 0.4 MG SL tablet Place 1 tablet (0.4 mg  total) under the tongue every 5 (five) minutes as needed for chest pain. 09/02/19   Imogene Burn, PA-C  omeprazole (PRILOSEC) 40 MG capsule TAKE 1 CAPSULE BY MOUTH EVERY DAY 12/04/21   Michela Pitcher, NP  ondansetron (ZOFRAN-ODT) 4 MG disintegrating tablet TAKE 1 TABLET BY MOUTH EVERY 8 HOURS AS NEEDED FOR NAUSEA AND VOMITING Patient taking differently: Take 4 mg by mouth every 8 (eight) hours as needed for vomiting or nausea. 11/06/21   Michela Pitcher, NP  spironolactone (ALDACTONE) 25 MG tablet TAKE 1 TABLET BY MOUTH EVERY DAY 02/26/22   [name], MD  vitamin B-12 (CYANOCOBALAMIN) 500 MCG tablet Take 2 tablets (1,000 mcg total) by mouth 2 (two) times daily. 05/16/21   Kennyth Arnold, FNP  Vitamin D, Ergocalciferol, (DRISDOL) 1.25 MG (50000 UNIT)  CAPS capsule Take 1 capsule (50,000 Units total) by mouth every 7 (seven) days. Please return for vit d level check on 02/10/21. 12/14/20   [name], MD      Allergies    Iodinated contrast media and Sulfa antibiotics    Review of Systems   Review of Systems  All other systems reviewed and are negative.  Physical Exam Updated Vital Signs BP (!) 151/80 (BP Location: Left Arm)   Pulse 71   Temp (!) 97.2 F (36.2 C) (Oral)   Resp 16   Ht 1.651 m ('5\' 5"'$ )   Wt 130.6 kg   SpO2 100%   BMI 47.93 kg/m  Physical Exam Vitals and nursing note reviewed.  Constitutional:      General: She is not in acute distress.    Appearance: Normal appearance. She is well-developed. She is not toxic-appearing.  HENT:     Head: Normocephalic and atraumatic.  Eyes:     General: Lids are normal.     Conjunctiva/sclera: Conjunctivae normal.     Pupils: Pupils are equal, round, and reactive to light.  Neck:     Thyroid: No thyroid mass.     Trachea: No tracheal deviation.  Cardiovascular:     Rate and Rhythm: Normal rate and regular rhythm.     Heart sounds: Normal heart sounds. No murmur heard.   No gallop.  Pulmonary:     Effort: Pulmonary effort is normal. No respiratory distress.     Breath sounds: Normal breath sounds. No stridor. No decreased breath sounds, wheezing, rhonchi or rales.  Abdominal:     General: There is no distension.     Palpations: Abdomen is soft.     Tenderness: There is no abdominal tenderness. There is no rebound.  Musculoskeletal:        General: No tenderness. Normal range of motion.     Cervical back: Normal range of motion and neck supple.  Skin:    General: Skin is warm and dry.     Findings: No abrasion or rash.  Neurological:     Mental Status: She is alert and oriented to person, place, and time. Mental status is at baseline.     GCS: GCS eye subscore is 4. GCS verbal subscore is 5. GCS motor subscore is 6.     Cranial Nerves: No cranial nerve deficit.      Sensory: No sensory deficit.     Motor: Motor function is intact.  Psychiatric:        Attention and Perception: Attention normal.        Speech: Speech normal.        Behavior: Behavior normal.  ED Results / Procedures / Treatments   Labs (all labs ordered are listed, but only abnormal results are displayed) Labs Reviewed  CBG MONITORING, ED - Abnormal; Notable for the following components:      Result Value   Glucose-Capillary 42 (*)    All other components within normal limits    EKG EKG Interpretation  Date/Time:  Tuesday Apr 17 2022 10:58:19 EDT Ventricular Rate:  70 PR Interval:  139 QRS Duration: 84 QT Interval:  398 QTC Calculation: 430 R Axis:   30 Text Interpretation: Age not entered, assumed to be  76 years old for purpose of ECG interpretation Sinus rhythm Low voltage, precordial leads Confirmed by Lacretia Leigh ([id number]) on 04/17/2022 1:00:57 PM  Radiology No results found.  Procedures Procedures    Medications Ordered in ED Medications - No data to display  ED Course/ Medical Decision Making/ A&P                           Medical Decision Making Amount and/or Complexity of Data Reviewed Labs: ordered. Radiology: ordered.  Risk Prescription drug management.  Patient here with hyperglycemia that was treated with IV dextrose as well as a meal.  Blood sugar stabilized.  Her electrolytes were significant for worsening renal function as well as worsening hyperkalemia.  Her EKG per my interpretation showed no findings for hyperkalemia however.  Patient was given Lokelma 10 g orally here.  She is awake and alert at this time.  Discussed with Dr. Soyla Murphy from nephrology he recommended patient receive [facility] as well as to have her losartan, Lasix, Aldactone stopped.  He also recommend IV fluids.  This was ordered.  Discussed with hospitalist who will admit the patient  CRITICAL CARE Performed by: Leota Jacobsen Total critical care time: 60  minutes Critical care time was exclusive of separately billable procedures and treating other patients. Critical care was necessary to treat or prevent imminent or life-threatening deterioration. Critical care was time spent personally by me on the following activities: development of treatment plan with patient and/or surrogate as well as nursing, discussions with consultants, evaluation of patient's response to treatment, examination of patient, obtaining history from patient or surrogate, ordering and performing treatments and interventions, ordering and review of laboratory studies, ordering and review of radiographic studies, pulse oximetry and re-evaluation of patient's condition.          Final Clinical Impression(s) / ED Diagnoses Final diagnoses:  None    Rx / DC Orders ED Discharge Orders     None         Lacretia Leigh, MD 04/17/22 1643

## 2022-04-17 NOTE — Progress Notes (Signed)
Inpatient Diabetes Program Recommendations  AACE/ADA: New Consensus Statement on Inpatient Glycemic Control (2015)  Target Ranges:  Prepandial:   less than 140 mg/dL      Peak postprandial:   less than 180 mg/dL (1-2 hours)      Critically ill patients:  140 - 180 mg/dL   Lab Results  Component Value Date   GLUCAP 160 (H) 04/17/2022   HGBA1C 6.1 (A) 04/04/2022    Review of Glycemic Control  Latest Reference Range & Units 04/17/22 10:57 04/17/22 11:20 04/17/22 12:24  Glucose-Capillary 70 - 99 mg/dL 42 (LL) 109 (H) 160 (H)  (LL): Data is critically low (H): Data is abnormally high  Diabetes history: DM2 Outpatient Diabetes medications: Glipizide 5 mg BID, Metformin 1000 mg BID, Tradjenta 5 mg QD Current orders for Inpatient glycemic control: none  Spoke with patient at bedside.  Patient to ED with severe hypoglycemia. Patient states she woke up this morning and her whole body was numb.  She could not move.  States she fell out of bed.  She lives with her sister and brother.  She states her PCP decreased her Glipizide last week from 10 mg to 5 mg BID.  She states she wakes up every morning with a glucose between 65-70 mg/dL.  She eats a few glucose tablets until she can make her breakfast.  She states her CBGs are in the 140's at bedtime.  Initial glucose in ED was 42 mg/dL  Recommend discontinuing Glipizide at discharge.  Might consider Amaryl 1 mg daily as this is less likely to cause hypoglycemia.  Follow up with PCP.   Will continue to follow while inpatient.  Thank you, Reche Dixon, MSN, [place] Diabetes Coordinator Inpatient Diabetes Program [phone] (team pager from 8a-5p)

## 2022-04-17 NOTE — ED Triage Notes (Signed)
Pt bib ems for hypoglycemia.  Pt's cbg upon ems arrival 59, pt given 2 oral glucose, repeat cbg 65.  Upon arrival to ED pt's cbg 42.  Pt given OJ w/ sugar during triage.  Pt A&Ox4 in triage.

## 2022-04-18 ENCOUNTER — Observation Stay (HOSPITAL_COMMUNITY): Payer: Medicare PPO

## 2022-04-18 DIAGNOSIS — I10 Essential (primary) hypertension: Secondary | ICD-10-CM

## 2022-04-18 DIAGNOSIS — I2510 Atherosclerotic heart disease of native coronary artery without angina pectoris: Secondary | ICD-10-CM | POA: Diagnosis not present

## 2022-04-18 DIAGNOSIS — N281 Cyst of kidney, acquired: Secondary | ICD-10-CM | POA: Diagnosis not present

## 2022-04-18 DIAGNOSIS — K5730 Diverticulosis of large intestine without perforation or abscess without bleeding: Secondary | ICD-10-CM | POA: Diagnosis not present

## 2022-04-18 DIAGNOSIS — N184 Chronic kidney disease, stage 4 (severe): Secondary | ICD-10-CM | POA: Diagnosis not present

## 2022-04-18 DIAGNOSIS — K769 Liver disease, unspecified: Secondary | ICD-10-CM | POA: Diagnosis not present

## 2022-04-18 DIAGNOSIS — E875 Hyperkalemia: Secondary | ICD-10-CM | POA: Diagnosis not present

## 2022-04-18 DIAGNOSIS — N179 Acute kidney failure, unspecified: Secondary | ICD-10-CM | POA: Diagnosis not present

## 2022-04-18 DIAGNOSIS — Z9861 Coronary angioplasty status: Secondary | ICD-10-CM | POA: Diagnosis not present

## 2022-04-18 LAB — CBC
HCT: 31.1 % — ABNORMAL LOW (ref 36.0–46.0)
Hemoglobin: 9.1 g/dL — ABNORMAL LOW (ref 12.0–15.0)
MCH: 22.2 pg — ABNORMAL LOW (ref 26.0–34.0)
MCHC: 29.3 g/dL — ABNORMAL LOW (ref 30.0–36.0)
MCV: 76 fL — ABNORMAL LOW (ref 80.0–100.0)
Platelets: 203 10*3/uL (ref 150–400)
RBC: 4.09 MIL/uL (ref 3.87–5.11)
RDW: 14.5 % (ref 11.5–15.5)
WBC: 5 10*3/uL (ref 4.0–10.5)
nRBC: 0 % (ref 0.0–0.2)

## 2022-04-18 LAB — GLUCOSE, CAPILLARY
Glucose-Capillary: 86 mg/dL (ref 70–99)
Glucose-Capillary: 159 mg/dL — ABNORMAL HIGH (ref 70–99)

## 2022-04-18 LAB — COMPREHENSIVE METABOLIC PANEL
ALT: 9 U/L (ref 0–44)
AST: 13 U/L — ABNORMAL LOW (ref 15–41)
Albumin: 3.3 g/dL — ABNORMAL LOW (ref 3.5–5.0)
Alkaline Phosphatase: 69 U/L (ref 38–126)
Anion gap: 6 (ref 5–15)
BUN: 34 mg/dL — ABNORMAL HIGH (ref 8–23)
CO2: 25 mmol/L (ref 22–32)
Calcium: 8.9 mg/dL (ref 8.9–10.3)
Chloride: 109 mmol/L (ref 98–111)
Creatinine, Ser: 2.47 mg/dL — ABNORMAL HIGH (ref 0.44–1.00)
GFR, Estimated: 20 mL/min — ABNORMAL LOW (ref >60)
Glucose, Bld: 87 mg/dL (ref 70–99)
Potassium: 4.2 mmol/L (ref 3.5–5.1)
Sodium: 140 mmol/L (ref 135–145)
Total Bilirubin: 0.7 mg/dL (ref 0.3–1.2)
Total Protein: 6.1 g/dL — ABNORMAL LOW (ref 6.5–8.1)

## 2022-04-18 LAB — MAGNESIUM: Magnesium: 2.1 mg/dL (ref 1.7–2.4)

## 2022-04-18 MED ORDER — AMLODIPINE BESYLATE 10 MG PO TABS: 10.0000 mg | ORAL_TABLET | Freq: Every day | ORAL | Status: DC

## 2022-04-18 MED ORDER — VITAMIN B-12 500 MCG PO TABS: 1000.0000 ug | ORAL_TABLET | Freq: Every day | ORAL | Status: AC

## 2022-04-18 NOTE — Plan of Care (Signed)

## 2022-04-18 NOTE — Progress Notes (Signed)
Discharged home per order. Discharge instructions reviewed with pt, voiced understanding. Copy of instructions given to pt. Monitor removed. IV d/c. All personal belongings sent with pt.

## 2022-04-18 NOTE — Discharge Summary (Signed)
Physician Discharge Summary  Alicia [name] BWG:[id number] DOB: [birth year] DOA: 04/17/2022  PCP: Michela Pitcher, NP  Admit date: 04/17/2022 Discharge date: 04/18/2022  Admitted From: Home Disposition: Home  Recommendations for Outpatient Follow-up:  Follow up with PCP in 1 week with repeat CBC/BMP Outpatient follow-up with nephrology/[name] Follow up in ED if symptoms worsen or new appear   Home Health: No Equipment/Devices: None  Discharge Condition: Stable CODE STATUS: Full Diet recommendation: Heart healthy/carb modified diet/fluid restriction of up to 1500 cc a day  Brief/Interim Summary: 76 y.o. female with medical history significant of diabetes mellitus type 2, hypertension, hyperlipidemia, CAD status post stenting, remote right basal ganglia stroke without residual deficit in March 2020, morbid obesity presented with low blood sugars.  On presentation, initial blood glucose was 42 which improved after IV dextrose. Potassium of 5.2 with creatinine of 3.07; repeat potassium of 5.8 and creatinine of 3.  EKG did not show peaked T waves.  ED provider spoke to Dr. Schertz/nephrology who recommended [facility], IV fluids and to discontinue Lasix/spironolactone and losartan.  During the hospitalization, her potassium has normalized and renal function has returned close to baseline.  Renal ultrasound showed bilateral hydronephrosis.  CT of the abdomen and pelvis did not show hydronephrosis.  She will be discharged home today with close outpatient follow-up with PCP and nephrology.  Discharge Diagnoses:   AKI on CKD stage IV Hyperkalemia -Baseline creatinine fluctuating 2.3-2.8.  Creatinine 3 on presentation, creatinine was 2.07 on 04/04/2022 -Potassium 5.8 on presentation.  No EKG changes.  ED provider spoke to Dr. Schertz/nephrology who recommended Lokelma 10 g 3 times daily until potassium is less than 5 along with IV fluids and to discontinue spironolactone/Lasix and  losartan -Subsequently, her potassium has normalized and renal function has returned close to baseline.  Renal ultrasound showed bilateral hydronephrosis.  CT of the abdomen and pelvis did not show hydronephrosis.  She will be discharged home today with close outpatient follow-up with PCP and nephrology.  Spironolactone, Lasix and losartan to remain on hold till reevaluation by PCP or nephrology.   Hypoglycemia Diabetes mellitus type 2 -Blood glucose 42 on presentation.  Improved with IV dextrose.  Glipizide was apparently recently decreased in dose.  Carb modified diet.  We will hold current home regimen on discharge till reevaluation by PCP.    Hypertension -Continue Coreg, amlodipine, Imdur.  Hold losartan, Lasix and spironolactone till reevaluation by PCP   Anemia of chronic disease -From renal failure.  Hemoglobin stable.  Monitor   Morbid obesity -Outpatient follow-up  CAD status post stenting Hyperlipidemia -continue current home regimen including aspirin, statin and Plavix.  Outpatient follow-up with cardiology  Discharge Instructions   Allergies as of 04/18/2022       Reactions   Iodinated Contrast Media Hives   Sulfa Antibiotics Hives        Medication List     STOP taking these medications    furosemide 40 MG tablet Commonly known as: LASIX   glipiZIDE 5 MG tablet Commonly known as: GLUCOTROL   linagliptin 5 MG Tabs tablet Commonly known as: Tradjenta   losartan 100 MG tablet Commonly known as: COZAAR   losartan 50 MG tablet Commonly known as: COZAAR   metFORMIN 1000 MG tablet Commonly known as: GLUCOPHAGE   spironolactone 25 MG tablet Commonly known as: ALDACTONE   Vitamin D (Ergocalciferol) 1.25 MG (50000 UNIT) Caps capsule Commonly known as: DRISDOL       TAKE these medications    amLODipine  10 MG tablet Commonly known as: NORVASC Take 1 tablet (10 mg total) by mouth at bedtime.   aspirin EC 81 MG tablet Take 1 tablet (81 mg total)  by mouth daily. Swallow whole.   atorvastatin 80 MG tablet Commonly known as: LIPITOR TAKE 1 TABLET BY MOUTH DAILY AT 6 PM. What changed: when to take this   carvedilol 25 MG tablet Commonly known as: COREG Take 2 tablets (50 mg total) by mouth 2 (two) times daily with a meal. Please make yearly appt with Dr. Harrington Challenger for January 2023 for future refills. Thank you 1st attempt What changed: additional instructions   clopidogrel 75 MG tablet Commonly known as: PLAVIX TAKE 1 TABLET BY MOUTH EVERY DAY What changed: when to take this   doxylamine (Sleep) 25 MG tablet Commonly known as: UNISOM Take 25 mg by mouth at bedtime.   ferrous sulfate 325 (65 FE) MG tablet TAKE 1 TABLET BY MOUTH EVERY DAY WITH BREAKFAST What changed: See the new instructions.   isosorbide mononitrate 60 MG 24 hr tablet Commonly known as: IMDUR Take 1 tablet (60 mg total) by mouth daily. Please make overdue appt with Dr. Harrington Challenger before anymore refills. Thank you 1st attempt What changed: additional instructions   Magnesium 200 MG Tabs Take 200 mg by mouth daily.   nitroGLYCERIN 0.4 MG SL tablet Commonly known as: NITROSTAT Place 1 tablet (0.4 mg total) under the tongue every 5 (five) minutes as needed for chest pain.   omeprazole 40 MG capsule Commonly known as: PRILOSEC TAKE 1 CAPSULE BY MOUTH EVERY DAY What changed:  how much to take when to take this   ondansetron 4 MG disintegrating tablet Commonly known as: ZOFRAN-ODT TAKE 1 TABLET BY MOUTH EVERY 8 HOURS AS NEEDED FOR NAUSEA AND VOMITING What changed:  how much to take how to take this when to take this reasons to take this additional instructions   OneTouch Ultra test strip Generic drug: glucose blood 1 each by Other route 2 (two) times daily. LAST REFILL SCHEDULE PHYSICAL   Repatha SureClick 132 MG/ML Soaj Generic drug: Evolocumab INJECT 1 PEN INTO THE SKIN EVERY 14 (FOURTEEN) DAYS. What changed: how much to take   vitamin B-12 500 MCG  tablet Commonly known as: CYANOCOBALAMIN Take 2 tablets (1,000 mcg total) by mouth daily.   Vitamin D3 50 MCG (2000 UT) Tabs Take 2,000 Units by mouth daily. What changed: Another medication with the same name was removed. Continue taking this medication, and follow the directions you see here.         Follow-up Information     Michela Pitcher, NP. Schedule an appointment as soon as possible for a visit in 1 week(s).   Specialties: Nurse Practitioner, Family Medicine Why: with repeat bmp Contact information: [place] [address] [phone]         [name], MD .   Specialty: Cardiology Contact information: [address] [place] [address] [phone]         Corliss Parish, MD. Schedule an appointment as soon as possible for a visit in 1 week(s).   Specialty: Nephrology Contact information: [address] [place] [address] [phone]                Allergies  Allergen Reactions   Iodinated Contrast Media Hives   Sulfa Antibiotics Hives    Consultations: ED provider spoke to nephrology/Dr. Jonnie Finner Curbsided Dr. Lester/Urology who recommended CT abdomen/pelvis to  evaluate for hydronephrosis   Procedures/Studies: DG Neck Soft Tissue  Result Date: 04/05/2022 CLINICAL DATA:  Globus sensation. Patient reports having swallowing difficulties last week and a half, and notes has been worse last few days. EXAM: NECK SOFT TISSUES - 1+ VIEW COMPARISON:  X-ray cervical 07/29/2011; CTA neck 01/18/2019. FINDINGS: There is no evidence of retropharyngeal soft tissue swelling or epiglottic enlargement. No radio-opaque foreign body identified. Degenerative changes of spine. IMPRESSION: Negative. Electronically Signed   By: Anner Crete M.D.   On: 04/05/2022 14:48   US RENAL  Result Date: 04/17/2022 CLINICAL DATA:  Renal failure EXAM: RENAL / URINARY TRACT ULTRASOUND COMPLETE COMPARISON:  CT abdomen pelvis  10/12/2011 FINDINGS: Right Kidney: Renal measurements: 9.0 x 4.8 x 4.8 cm = volume: 108 mL. Increased echogenicity of the renal cortex. Mild hydronephrosis. No renal mass. Left Kidney: Renal measurements: 10.7 x 5.9 x 5.4 cm = volume: 177 mL. Increased cortical echogenicity. Moderate left hydronephrosis. No renal mass. Bladder: Appears normal for degree of bladder distention. Other: None. IMPRESSION: Bilateral hydronephrosis left greater than right. Increased renal cortical echogenicity. Electronically Signed   By: Franchot Gallo M.D.   On: 04/17/2022 19:03   DG Chest Port 1 View  Result Date: 04/17/2022 CLINICAL DATA:  Pt bib ems for hypoglycemia. Pt's cbg upon ems arrival 59, pt given 2 oral glucose, repeat cbg 65. Upon arrival to ED pt's cbg 42. Pt given OJ w/ sugar during triage EXAM: PORTABLE CHEST - 1 VIEW COMPARISON:  11/15/2021 FINDINGS: Lungs are clear. Heart size upper limits normal for technique. Aortic Atherosclerosis (ICD10-170.0). No effusion. Visualized bones unremarkable. IMPRESSION: No acute cardiopulmonary disease. Electronically Signed   By: Lucrezia Europe M.D.   On: 04/17/2022 16:14      Subjective: Patient seen and examined at bedside.  Feels better.  Feels okay to go home today.  Denies worsening shortness of breath, nausea or vomiting.  Discharge Exam: Vitals:   04/18/22 0100 04/18/22 0515  BP: 131/72 140/60  Pulse: 74 73  Resp:    Temp: 98.3 F (36.8 C) 98.6 F (37 C)  SpO2: 100% 96%    General: Pt is alert, awake, not in acute distress.  Currently on room air. Cardiovascular: rate controlled, S1/S2 + with some scattered crackles Respiratory: bilateral decreased breath sounds at bases Abdominal: Soft, NT, ND, bowel sounds + Extremities: Trace lower extremity edema; no cyanosis    The results of significant diagnostics from this hospitalization (including imaging, microbiology, ancillary and laboratory) are listed below for reference.     Microbiology: No results  found for this or any previous visit (from the past 240 hour(s)).   Labs: BNP (last 3 results) No results for input(s): BNP in the last 8760 hours. Basic Metabolic Panel: Recent Labs  Lab 04/17/22 1357 04/18/22 0538  NA 137 140  K 5.8* 4.2  CL 104 109  CO2 25 25  GLUCOSE 195* 87  BUN 47* 34*  CREATININE 3.00* 2.47*  CALCIUM 9.2 8.9  MG  --  2.1   Liver Function Tests: Recent Labs  Lab 04/17/22 1357 04/18/22 0538  AST 15 13*  ALT 12 9  ALKPHOS 75 69  BILITOT 0.5 0.7  PROT 6.6 6.1*  ALBUMIN 3.6 3.3*   No results for input(s): LIPASE, AMYLASE in the last 168 hours. No results for input(s): AMMONIA in the last 168 hours. CBC: Recent Labs  Lab 04/17/22 1357 04/18/22 0538  WBC 4.6 5.0  NEUTROABS 2.7  --   HGB 9.9* 9.1*  HCT 32.7* 31.1*  MCV 76.2* 76.0*  PLT 218 203   Cardiac Enzymes: No results for input(s): CKTOTAL, CKMB, CKMBINDEX, TROPONINI in the last 168 hours. BNP: Invalid input(s): POCBNP CBG: Recent Labs  Lab 04/17/22 1656 04/17/22 2036 04/17/22 2139 04/18/22 0752 04/18/22 1150  GLUCAP 87 170* 148* 86 159*   D-Dimer No results for input(s): DDIMER in the last 72 hours. Hgb A1c No results for input(s): HGBA1C in the last 72 hours. Lipid Profile No results for input(s): CHOL, HDL, LDLCALC, TRIG, CHOLHDL, LDLDIRECT in the last 72 hours. Thyroid function studies No results for input(s): TSH, T4TOTAL, T3FREE, THYROIDAB in the last 72 hours.  Invalid input(s): FREET3 Anemia work up No results for input(s): VITAMINB12, FOLATE, FERRITIN, TIBC, IRON, RETICCTPCT in the last 72 hours. Urinalysis    Component Value Date/Time   COLORURINE YELLOW 04/17/2022 1403   APPEARANCEUR CLEAR 04/17/2022 1403   LABSPEC 1.005 04/17/2022 1403   PHURINE 6.0 04/17/2022 1403   GLUCOSEU NEGATIVE 04/17/2022 1403   [place] 04/17/2022 [place] 04/17/2022 1403   [place] 04/17/2022 1403   PROTEINUR NEGATIVE 04/17/2022 1403    UROBILINOGEN 0.2 10/12/2011 1726   NITRITE NEGATIVE 04/17/2022 1403   [place] 04/17/2022 1403   Sepsis Labs Invalid input(s): PROCALCITONIN,  WBC,  LACTICIDVEN Microbiology No results found for this or any previous visit (from the past 240 hour(s)).   Time coordinating discharge: 35 minutes  SIGNED:   Aline August, MD  [facility] 04/18/2022, 1:01 PM

## 2022-04-20 ENCOUNTER — Encounter: Payer: Self-pay | Admitting: Nurse Practitioner

## 2022-04-20 ENCOUNTER — Telehealth: Payer: Self-pay | Admitting: Nurse Practitioner

## 2022-04-20 ENCOUNTER — Ambulatory Visit: Payer: Medicare PPO | Admitting: Nurse Practitioner

## 2022-04-20 VITALS — BP 110/64 | HR 55 | Temp 97.1°F | Resp 14 | Ht 65.0 in | Wt 294.1 lb

## 2022-04-20 DIAGNOSIS — E119 Type 2 diabetes mellitus without complications: Secondary | ICD-10-CM

## 2022-04-20 DIAGNOSIS — N184 Chronic kidney disease, stage 4 (severe): Secondary | ICD-10-CM | POA: Diagnosis not present

## 2022-04-20 DIAGNOSIS — E162 Hypoglycemia, unspecified: Secondary | ICD-10-CM | POA: Diagnosis not present

## 2022-04-20 DIAGNOSIS — N179 Acute kidney failure, unspecified: Secondary | ICD-10-CM | POA: Diagnosis not present

## 2022-04-20 DIAGNOSIS — R600 Localized edema: Secondary | ICD-10-CM | POA: Diagnosis not present

## 2022-04-20 LAB — COMPREHENSIVE METABOLIC PANEL
ALT: 7 U/L (ref 0–35)
AST: 12 U/L (ref 0–37)
Albumin: 3.8 g/dL (ref 3.5–5.2)
Alkaline Phosphatase: 81 U/L (ref 39–117)
BUN: 35 mg/dL — ABNORMAL HIGH (ref 6–23)
CO2: 28 mEq/L (ref 19–32)
Calcium: 9.3 mg/dL (ref 8.4–10.5)
Chloride: 105 mEq/L (ref 96–112)
Creatinine, Ser: 2.47 mg/dL — ABNORMAL HIGH (ref 0.40–1.20)
GFR: 18.74 mL/min — ABNORMAL LOW (ref >60.00)
Glucose, Bld: 163 mg/dL — ABNORMAL HIGH (ref 70–99)
Potassium: 4.7 mEq/L (ref 3.5–5.1)
Sodium: 141 mEq/L (ref 135–145)
Total Bilirubin: 0.5 mg/dL (ref 0.2–1.2)
Total Protein: 6.8 g/dL (ref 6.0–8.3)

## 2022-04-20 LAB — BRAIN NATRIURETIC PEPTIDE: Pro B Natriuretic peptide (BNP): 111 pg/mL — ABNORMAL HIGH (ref 0.0–100.0)

## 2022-04-20 LAB — CBC
HCT: 30.6 % — ABNORMAL LOW (ref 36.0–46.0)
Hemoglobin: 9.4 g/dL — ABNORMAL LOW (ref 12.0–15.0)
MCHC: 30.8 g/dL (ref 30.0–36.0)
MCV: 72.6 fl — ABNORMAL LOW (ref 78.0–100.0)
Platelets: 212 10*3/uL (ref 150.0–400.0)
RBC: 4.21 Mil/uL (ref 3.87–5.11)
RDW: 14.5 % (ref 11.5–15.5)
WBC: 4.3 10*3/uL (ref 4.0–10.5)

## 2022-04-20 LAB — GLUCOSE, POCT (MANUAL RESULT ENTRY): POC Glucose: 185 mg/dl — AB (ref 70–99)

## 2022-04-20 MED ORDER — LINAGLIPTIN 5 MG PO TABS: 5.0000 mg | ORAL_TABLET | Freq: Every day | ORAL | 1 refills | Status: DC

## 2022-04-20 NOTE — Telephone Encounter (Signed)
Called Dr Clover Mealy office and spoke to her nurse. She will send a note to the scheduler about getting patient in. She did advise me that patient no showed appointment with them on 03/19/22 and 04/17/22. Advised that on 03/21/2022 patient was admitted to the hospital, they will not count that against her. I called patient and let her know that they will be calling and getting in touch with her to schedule and to make sure she lets them know if she is not able to make it. Patient said she did not know that she had an appointment on 03/19/22. Advised patient just to be careful because we do not want her to be dismissed from their office. LOV with them was in February 2023

## 2022-04-20 NOTE — Addendum Note (Signed)
Addended by: Kris Mouton on: 04/20/2022 03:43 PM   Modules accepted: Orders

## 2022-04-20 NOTE — Telephone Encounter (Signed)
Can we reach out to Dr. Clover Mealy with [name] about getting patinet in. She was admitted to hospital for an AKI and hypoglycemia. Seems that her kidney function has normalized for her but they did discontinue several medications including her losartan, metformin, lasix and spironolactone. I have not started any of these back pending her visit with Dr. Clover Mealy.  I did draw renal function in office today, pending the results

## 2022-04-20 NOTE — Assessment & Plan Note (Signed)
Currently managed by Dr. Clover Mealy at [facility]

## 2022-04-20 NOTE — Assessment & Plan Note (Signed)
Patient hospitalized for same and AKI.  Patient woke up and was unable to move this is her sign.  She did get out of bed and fell and pressed her rescue alert button on her wrist was taken to the nearest hospital

## 2022-04-20 NOTE — Patient Instructions (Addendum)
Nice to see you today I will be in touch with the labs once I have them  Get the nurse to check your blood pressure at home 3 times a week if you can. Send me those numbers a week at a time through [place].   I am going to add back in the Tradjenta (linagliptin). Continue checking your sugar twice a day and any time that you are feeling off or funny  I am going to reach out to your nephrologist and see about certain medications.   Follow up with me in 3 months, sooner if you need me

## 2022-04-20 NOTE — Assessment & Plan Note (Signed)
Last office visit we cut her glipizide in half and patient experienced hypoglycemic event that took her to the hospital.  They discontinued all of her antidiabetic meds until follow-up with me.  She is checking her glucose 2 times at home with ranges from 90s to 140s to 60s.  We will start back her Tradjenta and consider Amaryl in the future if she needs general hyperglycemic control

## 2022-04-20 NOTE — Progress Notes (Signed)
Established Patient Office Visit  Subjective   Patient ID: Rosangela Fehrenbach, female    DOB: 08-20-1946  Age: 76 y.o. MRN: [id number]  Chief Complaint  Patient presents with   Hospitalization Follow-up    Sugar readings at home have been 89-146-during the day    HPI Hospital follow up:  Patient went to the emergency department on 04/17/2022 for inability to use one side of the body. They discovered that her glocuse was in the 42s and that she was suffering for an AKI. She was admitted to the hospital where discontinued her ARB and DM regimen   Discharged on Wednesday 04/19/2022  States that she will check her sugar in the morning and early afternoon: states that they have been running approx 89 in the am and then after she has a meal she will be 140-160s  States that she is doing the nutrisystem for diabetes and having approx 1000-1200 calories a day States that she has been doing that approx 6 weeks.  She recently saw me and we did reduce her glipizide A1c was 6.1.  [facility] is patient's nephrologist.  Patient states she did reach out and speak to the office that we will call her back with an appointment.    Review of Systems  Constitutional:  Negative for chills and fever.  Cardiovascular:  Negative for chest pain and leg swelling.  Gastrointestinal:  Negative for diarrhea, nausea and vomiting.  Genitourinary:  Negative for dysuria.     Objective:     BP 110/64   Pulse (!) 55   Temp (!) 97.1 F (36.2 C)   Resp 14   Ht '5\' 5"'$  (1.651 m)   Wt 294 lb 1 oz (133.4 kg)   SpO2 99%   BMI 48.93 kg/m  BP Readings from Last 3 Encounters:  04/20/22 110/64  04/18/22 140/60  04/04/22 110/78   Wt Readings from Last 3 Encounters:  04/20/22 294 lb 1 oz (133.4 kg)  04/18/22 291 lb 10.7 oz (132.3 kg)  04/04/22 293 lb 8 oz (133.1 kg)      Physical Exam Vitals and nursing note reviewed.  Constitutional:      Appearance: Normal appearance. She is  obese.  Cardiovascular:     Rate and Rhythm: Normal rate and regular rhythm.     Heart sounds: Normal heart sounds.  Pulmonary:     Effort: Pulmonary effort is normal.     Breath sounds: Normal breath sounds.  Musculoskeletal:     Right lower leg: 1+ Pitting Edema present.     Left lower leg: 1+ Pitting Edema present.  Neurological:     Mental Status: She is alert.     Results for orders placed or performed in visit on 04/20/22  Glucose (CBG)  Result Value Ref Range   POC Glucose 185 (A) 70 - 99 mg/dl      The ASCVD Risk score (Arnett DK, et al., 2019) failed to calculate for the following reasons:   The patient has a prior MI or stroke diagnosis    Assessment & Plan:   Problem List Items Addressed This Visit       Endocrine   Type 2 diabetes mellitus without complication, without long-term current use of insulin ([place]) - Primary    Last office visit we cut her glipizide in half and patient experienced hypoglycemic event that took her to the hospital.  They discontinued all of her antidiabetic meds until follow-up with me.  She  is checking her glucose 2 times at home with ranges from 90s to 140s to 60s.  We will start back her Tradjenta and consider Amaryl in the future if she needs general hyperglycemic control       Relevant Medications   linagliptin (TRADJENTA) 5 MG TABS tablet   Other Relevant Orders   CBC   Comprehensive metabolic panel   Glucose (CBG) (Completed)   Hypoglycemia    Patient hospitalized for same and AKI.  Patient woke up and was unable to move this is her sign.  She did get out of bed and fell and pressed her rescue alert button on her wrist was taken to the nearest hospital       Relevant Orders   Glucose (CBG) (Completed)     Genitourinary   CKD (chronic kidney disease), stage IV (HCC)    Currently managed by Dr. Clover Mealy at [facility]       AKI (acute kidney injury) [facility])    Only discharged from hospital from an AKI.  Patient was  discontinued on several medications inclusive of losartan, metformin, glipizide, furosemide, spironolactone, and Tradjenta.  She is followed by Dr. Clover Mealy at [facility].  We will reach out to the office to see when she can be seen by nephrology and ask about some of his medications.  We will add back on Tradjenta currently as her glucose was 180s today and consider Amaryl in the future for diabetes control       Relevant Orders   CBC   Comprehensive metabolic panel     Other   Lower extremity edema   Relevant Orders   Brain natriuretic peptide    Return in about 3 months (around 07/21/2022), or May skew appointment up according to when patient gets in with nephrologist, for DM recheck.    Romilda Garret, NP

## 2022-04-20 NOTE — Assessment & Plan Note (Addendum)
Only discharged from hospital from an AKI.  Patient was discontinued on several medications inclusive of losartan, metformin, glipizide, furosemide, spironolactone, and Tradjenta.  She is followed by Dr. Clover Mealy at [facility].  We will reach out to the office to see when she can be seen by nephrology and ask about some of his medications.  We will add back on Tradjenta currently as her glucose was 180s today and consider Amaryl in the future for diabetes control

## 2022-04-20 NOTE — Telephone Encounter (Signed)
noted 

## 2022-04-22 ENCOUNTER — Other Ambulatory Visit: Payer: Self-pay | Admitting: Internal Medicine

## 2022-04-23 NOTE — Telephone Encounter (Signed)
Spoke with patient. See lab result note. 

## 2022-04-23 NOTE — Telephone Encounter (Signed)
Pt called and stated she was asked to give the office a call back, please return a call when possible, thanks.  Callback Number: [phone]

## 2022-04-25 ENCOUNTER — Telehealth: Payer: Self-pay

## 2022-04-25 NOTE — Telephone Encounter (Signed)
Patient advised. Patient states she also called and left message for them too. I asked patient to call me back if she hears something from their office before me

## 2022-04-25 NOTE — Telephone Encounter (Signed)
Called Dr Clover Mealy office to follow up on this. Left message with the scheduler to have patient get scheduled. Tried calling patient back to let her know update but phone kept been busy

## 2022-04-25 NOTE — Telephone Encounter (Addendum)
Patient is calling in to advise that she does not have an appointment with her kidney specialist. Wanting to know what she should do next.

## 2022-04-26 DIAGNOSIS — H6123 Impacted cerumen, bilateral: Secondary | ICD-10-CM | POA: Diagnosis not present

## 2022-04-27 ENCOUNTER — Encounter: Payer: Self-pay | Admitting: Nurse Practitioner

## 2022-04-27 DIAGNOSIS — E119 Type 2 diabetes mellitus without complications: Secondary | ICD-10-CM

## 2022-04-27 NOTE — Telephone Encounter (Addendum)
Unable to reach pt by phone; no answer and no v/m; I spoke with Wynonia Lawman (DPR signed) and he will try to get in touch with pt and have pt call [facility] for further information; to see if following DM diet, any exercising and any hyperglycemic reactions and is pt taking Tradjenta 5 mg daily. Pt last saw Romilda Garret NP on 04/20/22. Sending note to [place] triage.

## 2022-04-27 NOTE — Telephone Encounter (Signed)
Pt notified as instructed and voiced understanding; pt request calling CL and notifying him which I did and CL voiced understanding also when I spoke with both pt and CL (DPR signed) UC & ED precautions were given and both voiced understanding. When pt is called back after Matt's return CL request cb as well. Sending note to Romilda Garret NP and Anastasiya CMA.

## 2022-04-27 NOTE — Telephone Encounter (Signed)
I spoke with Alicia [name] and Alicia [name] is presently taking Tradjenta 5 mg daily and has not missed any medication. Alicia [name] saw Romilda Garret NP on 04/20/22 as HFU and was note if needed may add amaryl for diabetic control.  Alicia [name] said she is following DM diet very closely and walking 10 mins every day. Alicia [name] denies any hyperglycemic symptoms. Alicia [name] said 04/27/22 FBS was 246. Alicia [name] usually takes Tradjenta 5 mg around 10:30 every day. Romilda Garret NP is out of office until 05/01/22. Sending note to Dr Glori Bickers who is in office to get further guidance of what Alicia [name] should do and Anastaciya CMA. Will teams Louretta Shorten CMA who is working with Dr Glori Bickers today. UC & ED precautions given and Alicia [name] voiced understanding. [place]

## 2022-04-27 NOTE — Telephone Encounter (Signed)
Pt was calling about her numbers in her MyChart message and wanted to know if Charmian Muff could look at these and have the nurse call her back. Please advise when possible, thanks.  Callback Number: [phone]

## 2022-04-27 NOTE — Telephone Encounter (Signed)
Tower, Wynelle Fanny, MD to [place], Delila Pereyra, Bradford, James M, NP      04/27/22  1:26 PM  I don't feel comfortable starting amaryl until Catalina Antigua is back, her note indicated she was hospitalized recently with hypoglycemia  Continue to watch diet very carefully and follow blood sugars  Will cc pcp

## 2022-05-01 NOTE — Telephone Encounter (Signed)
Spoke with Dr Moshe Cipro off and patient and she is scheduled for follow up with them on 05/04/22

## 2022-05-01 NOTE — Telephone Encounter (Signed)
Can we call the nephrologist and see if she has an appointment yet? Also Can we see if she is ok with [place] being on metformin with the lower kidney function. She has been on it in the past for an extended period of time. Just want to make sure she is ok with it prior to starting her back on more diabetic medications. I am going to try and avoid the amaryl if we can. We can also update the patient to let her know that we are working on seeing what her best options are

## 2022-05-01 NOTE — Telephone Encounter (Signed)
Sherice from kidney office called back. Dr Moshe Cipro does not want patient to be on Metformin or any SG02 medications. She suggests medication class of Trulicity, Ozempic, Bayada type.

## 2022-05-02 MED ORDER — TRULICITY 0.75 MG/0.5ML ~~LOC~~ SOAJ: 0.7500 mg | SUBCUTANEOUS | 0 refills | Status: DC

## 2022-05-02 NOTE — Telephone Encounter (Signed)
Can we call and let Elaya know that the kidney doctor recommends that she do one of the injectable medications like ozempic or trulicity. The other medications we need to avoid because of her kidney function. In order to do one of the injectable medications she will need to come off the tradjenta. Given she had the episode of hypoglycemia I want to avoid amaryl currently. I will send in trulicity and see if it is covered by her insurance. I also want to see her in 1 month for follow up in office and she needs to continue taking her sugar levels at home.  Just verify the patient has no FH or personal history of medullary thyroid cancer, MENS type 2 (multiple endocrine neoplasia type 2, rare cancer) or pancreatitis.   Side effects of medication can be nausea, constipation, diarrhea and belly pain. If she starts having stomach pain she needs to let me know.  She will start with trulicity 0.'75mg'$  once a week for a month and then we will titrate her up to 1.'5mg'$  the next month

## 2022-05-02 NOTE — Telephone Encounter (Signed)
Called patient was not home and will not be in until 2 pm or later, sent patient mychart message also with the information since I will be off this afternoon. I will follow up with patient tomorrow unless patient reaches back out today.

## 2022-05-03 NOTE — Telephone Encounter (Signed)
Spoke with patient and advised. Follow up appointment made

## 2022-05-08 ENCOUNTER — Ambulatory Visit: Payer: Medicare PPO | Admitting: Nurse Practitioner

## 2022-05-26 ENCOUNTER — Other Ambulatory Visit: Payer: Self-pay | Admitting: Nurse Practitioner

## 2022-05-26 DIAGNOSIS — E119 Type 2 diabetes mellitus without complications: Secondary | ICD-10-CM

## 2022-05-27 ENCOUNTER — Other Ambulatory Visit: Payer: Self-pay | Admitting: Internal Medicine

## 2022-05-29 ENCOUNTER — Ambulatory Visit: Payer: Medicare PPO | Admitting: Nurse Practitioner

## 2022-05-29 ENCOUNTER — Encounter: Payer: Self-pay | Admitting: Nurse Practitioner

## 2022-05-29 VITALS — BP 122/74 | HR 74 | Temp 97.3°F | Resp 14 | Ht 65.0 in | Wt 296.5 lb

## 2022-05-29 DIAGNOSIS — E119 Type 2 diabetes mellitus without complications: Secondary | ICD-10-CM

## 2022-05-29 DIAGNOSIS — I10 Essential (primary) hypertension: Secondary | ICD-10-CM

## 2022-05-29 DIAGNOSIS — R600 Localized edema: Secondary | ICD-10-CM

## 2022-05-29 DIAGNOSIS — N184 Chronic kidney disease, stage 4 (severe): Secondary | ICD-10-CM | POA: Diagnosis not present

## 2022-05-29 MED ORDER — TRULICITY 0.75 MG/0.5ML ~~LOC~~ SOAJ: 0.7500 mg | SUBCUTANEOUS | 1 refills | Status: DC

## 2022-05-29 NOTE — Assessment & Plan Note (Addendum)
Last office visit we decided to discontinue her losartan medications.  Patient currently maintained on amlodipine, carvedilol, isosorbide mononitrate.  Patient tolerating medications well and blood pressure within normal limit.  Continue medication as prescribed follow-up with cardiology as recommended.

## 2022-05-29 NOTE — Assessment & Plan Note (Signed)
Patient is concerned that she has gained a few pounds but been doing well on her nutrition.  She does have 1+ pitting edema which seems to baseline of her last time BNP was checked was mostly negative that is continue her fluid pill and hospital due to AKI.  She does not have the ability to weigh herself at home but she is not experiencing any chest pain or shortness of breath continue to monitor currently

## 2022-05-29 NOTE — Patient Instructions (Signed)
Nice to see you today We will keep you on the same dose of Trulicity 0.'75mg'$ . Continue to monitor your sugars at home.  I want to see you in 2 months, sooner if you need me before Keep the appointment with the kidney doctor

## 2022-05-29 NOTE — Assessment & Plan Note (Signed)
Patient is managed by [name] at [facility].  Patient states that she did miss her last appointment due to mechanical issues with her vehicle.  Does have an appointment coming up on 06/04/2022.  Encourage patient to keep the appointment we will defer drawing labs today as nephrologist likely will check renal function.  Patient is having about 6 urinations a day

## 2022-05-29 NOTE — Assessment & Plan Note (Signed)
Patient was taken off many medications due to hypoglycemic incident that required hospitalization.  Last office that we did keep her on a DPP 4.  Did discuss with her nephrologist and she recommended anything besides SGLT2 and metformin.  Patient was not having adequate control on the DPP 4 so switch her to an a GLP-1 of Trulicity.  Currently on 0.75 mg daily tolerating medication well blood sugars as reported in office note.  We will continue 0.75 mg weekly until follow-up in office for A1c recheck.  Patient can checking blood sugar at home as she has been doing

## 2022-05-29 NOTE — Progress Notes (Signed)
Established Patient Office Visit  Subjective   Patient ID: Alicia [name], female    DOB: [birth year]  Age: 76 y.o. MRN: [id number]  Chief Complaint  Patient presents with   Diabetes    Follow up after starting Trulicity-tolerating it well, fasting reading at home averaging 146, mid day 164, in the evening 179      DM2: States that she will check her sugar and it averages 146 fasting. States that after breakfast and lunch it will be 164 and at night will be around  Blood pressure: states that it was checked  a couple times and has been around the office today.  She is currently amlodipine 10 mg, isosorbide mononitrate, carvedilol.  Prior to last office visit they discontinued her losartan medication.  Nephrology appointment scheduled  06/04/2022.    Review of Systems  Constitutional:  Negative for chills and fever.  Respiratory:  Negative for shortness of breath.   Cardiovascular:  Negative for chest pain.  Genitourinary:  Negative for dysuria and hematuria.      Objective:     BP 122/74   Pulse 74   Temp (!) 97.3 F (36.3 C)   Resp 14   Ht '5\' 5"'$  (1.651 m)   Wt 296 lb 8 oz (134.5 kg)   SpO2 100%   BMI 49.34 kg/m  BP Readings from Last 3 Encounters:  05/29/22 122/74  04/20/22 110/64  04/18/22 140/60   Wt Readings from Last 3 Encounters:  05/29/22 296 lb 8 oz (134.5 kg)  04/20/22 294 lb 1 oz (133.4 kg)  04/18/22 291 lb 10.7 oz (132.3 kg)      Physical Exam Vitals and nursing note reviewed.  Constitutional:      Appearance: Normal appearance.  Cardiovascular:     Rate and Rhythm: Normal rate and regular rhythm.     Heart sounds: Normal heart sounds.  Pulmonary:     Effort: Pulmonary effort is normal.     Breath sounds: Normal breath sounds.  Abdominal:     General: Bowel sounds are normal.  Musculoskeletal:     Right lower leg: 1+ Edema present.     Left lower leg: 1+ Edema present.  Neurological:     Mental Status: She is alert.  Psychiatric:         Mood and Affect: Mood normal.        Behavior: Behavior normal.        Thought Content: Thought content normal.        Judgment: Judgment normal.      No results found for any visits on 05/29/22.    The ASCVD Risk score (Arnett DK, et al., 2019) failed to calculate for the following reasons:   The patient has a prior MI or stroke diagnosis    Assessment & Plan:   Problem List Items Addressed This Visit       Cardiovascular and Mediastinum   Essential hypertension    Last office visit we decided to discontinue her losartan medications.  Patient currently maintained on amlodipine, carvedilol, isosorbide mononitrate.  Patient tolerating medications well and blood pressure within normal limit.  Continue medication as prescribed follow-up with cardiology as recommended.        Endocrine   Type 2 diabetes mellitus without complication, without long-term current use of insulin ([place])    Patient was taken off many medications due to hypoglycemic incident that required hospitalization.  Last office that we did keep her on a DPP 4.  Did  discuss with her nephrologist and she recommended anything besides SGLT2 and metformin.  Patient was not having adequate control on the DPP 4 so switch her to an a GLP-1 of Trulicity.  Currently on 0.75 mg daily tolerating medication well blood sugars as reported in office note.  We will continue 0.75 mg weekly until follow-up in office for A1c recheck.  Patient can checking blood sugar at home as she has been doing      Relevant Medications   Dulaglutide (TRULICITY) 2.11 ZN/3.5AP SOPN     Genitourinary   CKD (chronic kidney disease), stage IV [facility])    Patient is managed by Dr[facility] Moshe [name] at [facility].  Patient states that she did miss her last appointment due to mechanical issues with her vehicle.  Does have an appointment coming up on 06/04/2022.  Encourage patient to keep the appointment we will defer drawing labs today as nephrologist  likely will check renal function.  Patient is having about 6 urinations a day        Other   Lower extremity edema - Primary    Patient is concerned that she has gained a few pounds but been doing well on her nutrition.  She does have 1+ pitting edema which seems to baseline of her last time BNP was checked was mostly negative that is continue her fluid pill and hospital due to AKI.  She does not have the ability to weigh herself at home but she is not experiencing any chest pain or shortness of breath continue to monitor currently       Return in about 2 months (around 07/30/2022) for DM recheck.    Romilda Garret, NP

## 2022-05-31 ENCOUNTER — Other Ambulatory Visit: Payer: Self-pay | Admitting: Internal Medicine

## 2022-05-31 DIAGNOSIS — I10 Essential (primary) hypertension: Secondary | ICD-10-CM

## 2022-06-06 DIAGNOSIS — N184 Chronic kidney disease, stage 4 (severe): Secondary | ICD-10-CM | POA: Diagnosis not present

## 2022-06-06 DIAGNOSIS — D631 Anemia in chronic kidney disease: Secondary | ICD-10-CM | POA: Diagnosis not present

## 2022-06-06 DIAGNOSIS — N183 Chronic kidney disease, stage 3 unspecified: Secondary | ICD-10-CM | POA: Diagnosis not present

## 2022-06-06 DIAGNOSIS — N189 Chronic kidney disease, unspecified: Secondary | ICD-10-CM | POA: Diagnosis not present

## 2022-06-06 DIAGNOSIS — I2510 Atherosclerotic heart disease of native coronary artery without angina pectoris: Secondary | ICD-10-CM | POA: Diagnosis not present

## 2022-06-06 DIAGNOSIS — E1122 Type 2 diabetes mellitus with diabetic chronic kidney disease: Secondary | ICD-10-CM | POA: Diagnosis not present

## 2022-06-06 DIAGNOSIS — I129 Hypertensive chronic kidney disease with stage 1 through stage 4 chronic kidney disease, or unspecified chronic kidney disease: Secondary | ICD-10-CM | POA: Diagnosis not present

## 2022-06-06 DIAGNOSIS — N179 Acute kidney failure, unspecified: Secondary | ICD-10-CM | POA: Diagnosis not present

## 2022-06-06 DIAGNOSIS — E785 Hyperlipidemia, unspecified: Secondary | ICD-10-CM | POA: Diagnosis not present

## 2022-06-06 DIAGNOSIS — N390 Urinary tract infection, site not specified: Secondary | ICD-10-CM | POA: Diagnosis not present

## 2022-06-07 ENCOUNTER — Other Ambulatory Visit: Payer: Self-pay | Admitting: Internal Medicine

## 2022-06-09 ENCOUNTER — Other Ambulatory Visit: Payer: Self-pay | Admitting: Internal Medicine

## 2022-06-09 DIAGNOSIS — I10 Essential (primary) hypertension: Secondary | ICD-10-CM

## 2022-06-11 ENCOUNTER — Encounter: Payer: Self-pay | Admitting: Nurse Practitioner

## 2022-06-14 ENCOUNTER — Inpatient Hospital Stay: Payer: Medicare PPO | Attending: Hematology and Oncology

## 2022-06-14 ENCOUNTER — Inpatient Hospital Stay: Payer: Medicare PPO | Admitting: Hematology and Oncology

## 2022-06-14 ENCOUNTER — Other Ambulatory Visit: Payer: Self-pay | Admitting: Internal Medicine

## 2022-06-14 ENCOUNTER — Other Ambulatory Visit: Payer: Self-pay | Admitting: Hematology and Oncology

## 2022-06-14 ENCOUNTER — Other Ambulatory Visit: Payer: Self-pay

## 2022-06-14 VITALS — BP 155/73 | HR 71 | Temp 97.9°F | Resp 15 | Wt 284.5 lb

## 2022-06-14 DIAGNOSIS — Z800 Family history of malignant neoplasm of digestive organs: Secondary | ICD-10-CM | POA: Diagnosis not present

## 2022-06-14 DIAGNOSIS — I129 Hypertensive chronic kidney disease with stage 1 through stage 4 chronic kidney disease, or unspecified chronic kidney disease: Secondary | ICD-10-CM | POA: Insufficient documentation

## 2022-06-14 DIAGNOSIS — Z9884 Bariatric surgery status: Secondary | ICD-10-CM | POA: Insufficient documentation

## 2022-06-14 DIAGNOSIS — Z90710 Acquired absence of both cervix and uterus: Secondary | ICD-10-CM | POA: Diagnosis not present

## 2022-06-14 DIAGNOSIS — D631 Anemia in chronic kidney disease: Secondary | ICD-10-CM | POA: Diagnosis not present

## 2022-06-14 DIAGNOSIS — D509 Iron deficiency anemia, unspecified: Secondary | ICD-10-CM

## 2022-06-14 DIAGNOSIS — E1122 Type 2 diabetes mellitus with diabetic chronic kidney disease: Secondary | ICD-10-CM | POA: Diagnosis not present

## 2022-06-14 DIAGNOSIS — E1136 Type 2 diabetes mellitus with diabetic cataract: Secondary | ICD-10-CM | POA: Insufficient documentation

## 2022-06-14 DIAGNOSIS — N189 Chronic kidney disease, unspecified: Secondary | ICD-10-CM | POA: Insufficient documentation

## 2022-06-14 LAB — CBC WITH DIFFERENTIAL (CANCER CENTER ONLY)
Abs Immature Granulocytes: 0.01 10*3/uL (ref 0.00–0.07)
Basophils Absolute: 0 10*3/uL (ref 0.0–0.1)
Basophils Relative: 1 %
Eosinophils Absolute: 0.2 10*3/uL (ref 0.0–0.5)
Eosinophils Relative: 4 %
HCT: 32.3 % — ABNORMAL LOW (ref 36.0–46.0)
Hemoglobin: 9.7 g/dL — ABNORMAL LOW (ref 12.0–15.0)
Immature Granulocytes: 0 %
Lymphocytes Relative: 35 %
Lymphs Abs: 1.4 10*3/uL (ref 0.7–4.0)
MCH: 22.4 pg — ABNORMAL LOW (ref 26.0–34.0)
MCHC: 30 g/dL (ref 30.0–36.0)
MCV: 74.6 fL — ABNORMAL LOW (ref 80.0–100.0)
Monocytes Absolute: 0.4 10*3/uL (ref 0.1–1.0)
Monocytes Relative: 9 %
Neutro Abs: 2 10*3/uL (ref 1.7–7.7)
Neutrophils Relative %: 51 %
Platelet Count: 230 10*3/uL (ref 150–400)
RBC: 4.33 MIL/uL (ref 3.87–5.11)
RDW: 15.8 % — ABNORMAL HIGH (ref 11.5–15.5)
WBC Count: 4 10*3/uL (ref 4.0–10.5)
nRBC: 0 % (ref 0.0–0.2)

## 2022-06-14 LAB — RETIC PANEL
Immature Retic Fract: 12 % (ref 2.3–15.9)
RBC.: 4.33 MIL/uL (ref 3.87–5.11)
Retic Count, Absolute: 80.1 10*3/uL (ref 19.0–186.0)
Retic Ct Pct: 1.9 % (ref 0.4–3.1)
Reticulocyte Hemoglobin: 24.2 pg — ABNORMAL LOW (ref >27.9)

## 2022-06-14 LAB — CMP (CANCER CENTER ONLY)
ALT: 6 U/L (ref 0–44)
AST: 11 U/L — ABNORMAL LOW (ref 15–41)
Albumin: 3.9 g/dL (ref 3.5–5.0)
Alkaline Phosphatase: 101 U/L (ref 38–126)
Anion gap: 6 (ref 5–15)
BUN: 27 mg/dL — ABNORMAL HIGH (ref 8–23)
CO2: 29 mmol/L (ref 22–32)
Calcium: 8.8 mg/dL — ABNORMAL LOW (ref 8.9–10.3)
Chloride: 105 mmol/L (ref 98–111)
Creatinine: 2.27 mg/dL — ABNORMAL HIGH (ref 0.44–1.00)
GFR, Estimated: 22 mL/min — ABNORMAL LOW (ref >60)
Glucose, Bld: 153 mg/dL — ABNORMAL HIGH (ref 70–99)
Potassium: 4.4 mmol/L (ref 3.5–5.1)
Sodium: 140 mmol/L (ref 135–145)
Total Bilirubin: 0.5 mg/dL (ref 0.3–1.2)
Total Protein: 6.8 g/dL (ref 6.5–8.1)

## 2022-06-14 LAB — IRON AND IRON BINDING CAPACITY (CC-WL,HP ONLY)
Iron: 66 ug/dL (ref 28–170)
Saturation Ratios: 25 % (ref 10.4–31.8)
TIBC: 266 ug/dL (ref 250–450)
UIBC: 200 ug/dL (ref 148–442)

## 2022-06-14 LAB — FERRITIN: Ferritin: 186 ng/mL (ref 11–307)

## 2022-06-14 LAB — VITAMIN B12: Vitamin B-12: 2898 pg/mL — ABNORMAL HIGH (ref 180–914)

## 2022-06-14 NOTE — Progress Notes (Signed)
[facility] [place] [phone]) [phone]   Fax:([phone]) [place] NOTE  Patient Care Team: Michela Pitcher, NP as PCP - General (Pain Medicine) [name], MD as PCP - Cardiology (Cardiology) [name], MD as Consulting Physician (Cardiology)  Hematological/Oncological History #Microcytic Anemia #Anemia in Setting of CKD  -07/18/2020: Colonoscopy: one 5 mm polyp in descending colon. Diverticulosis in sigmoid colon and ascending colon.   -11/22/2020: WBC 4.5, Hgb 10.1 (L), MCV 69.6, Plt 235  -11/30/2020: Vitamin B12 186 (L), Retic Ct Pct 1.9%, Abs Retic 87,780 (H), Iron 62, Transferrin 219, iron saturation 20.2% (L), Ferritin 70.4, Folate 12.3. Started vitamin B12 2000 mcg per day.   -01/23/2021:EGD/EUS: Previous surgical intervention-[name] mini-gastric pouch found in the cardia. Submucosal nodule in the duodenum bulb removed via EMR. Pathology was consistent with polypoid dudoenal tissue showing nodular, mature adipose tissue within the submucosa, consistent with a lipoma. Negative for dysplasia or malignancy.  -03/29/2021: WBC 5.6, Hgb 9.2 (L), MCV 68.6 (L), Plt 251, Ferritin 56.6  -04/04/2021: Establish care with Alicia Query PA-C  04/28/2021-05/26/2021: Received IV venofer x 5 doses.   06/14/2022: WBC 4.0. Hgb 9.7, MCV 74.6, Plt 230  HISTORY OF PRESENTING ILLNESS:  Alicia [name] 76 y.o. female returns for a follow up for microcytic anemia.  She was last seen on 12/14/2021 by Alicia [name].  In the interim since her last visit she has had no major changes in her health.  On exam today Mrs. Alicia [name] reports she has been doing great in the interim since her last visit though her energy levels have been low.  She reports a recent drop in her energy down to a 6 out of 10.  She reports has not been having any issues with bleeding or bruising.  She reports he is also not having any nausea, ming, or diarrhea.  She is to continue to take her iron pill and  vitamin B12 pill.  She notes that she has establish care with Dr. Moshe [name] at [facility].  She reports that she has been taking off a lot of medication as was causing trouble to her kidneys.  She has not taken any blood pressure medications this morning.  She reports that she has had no changes in her diet and continues to eat well.  Patient denies any fevers, chills, night sweats, shortness of breath, chest pain or cough. She has no other complaints. Rest of the 10 point ROS is below.   MEDICAL HISTORY:  Past Medical History:  Diagnosis Date   Anemia    Arthritis    "right knee" (10/21/2015)   CAD (coronary artery disease)    a. Cath 10/21/15: s/p DES to RCA: moderate diffuse stenosis of mod diagonal, diffuse irregularity LCx and LAD. 2D echo 10/21/15: mod LVH, EF 60-65%, no RWMA, calcified MV, mod-severe LAE. 03/18/17 PCI with DES--> 1 diag   Cataract    Diabetes ([place])    Essential hypertension    Family history of adverse reaction to anesthesia    "oldest sister, [name], related to brain formation at back of head; when they put her to sleep it's hard to wake her up"   GERD (gastroesophageal reflux disease)    History of blood transfusion    "related to ruptured tubal pregnancy"   Hyperlipidemia    Hypertensive heart disease    Morbid obesity ([place])    Stroke (cerebrum) ([place])    Tubular adenoma of colon    Type II  diabetes mellitus ([place])     SURGICAL HISTORY: Past Surgical History:  Procedure Laterality Date   ABDOMINAL HYSTERECTOMY  1988   fibroid   APPENDECTOMY  1985   [place]   BIOPSY  07/18/2020   Procedure: BIOPSY;  Surgeon: [name], MD;  Location: [facility];  Service: Gastroenterology;;   CARDIAC CATHETERIZATION N/A 10/21/2015   Procedure: Right/Left Heart Cath and Coronary Angiography;  Surgeon: [name], MD;  Location: [place] [facility];  Service: Cardiovascular;  Laterality: N/A;   CHOLECYSTECTOMY OPEN  1984   COLONOSCOPY  WITH PROPOFOL N/A 07/18/2020   Procedure: COLONOSCOPY WITH PROPOFOL;  Surgeon: [name], MD;  Location: [facility];  Service: Gastroenterology;  Laterality: N/A;   CORONARY STENT INTERVENTION N/A 03/18/2017   Procedure: Coronary Stent Intervention;  Surgeon: [name], MD;  Location: [place] [facility];  Service: Cardiovascular;  Laterality: N/A;   CORONARY STENT PLACEMENT  03/18/2017   A OPTIMIZE STUDY Drug Eluting Stent (2.5 mm 18 mm - post-dilated to 2.7 mm) was successfully placed   ECTOPIC PREGNANCY SURGERY     ENDOSCOPIC MUCOSAL RESECTION N/A 01/23/2021   Procedure: ENDOSCOPIC MUCOSAL RESECTION;  Surgeon: Irving Copas., MD;  Location: [facility];  Service: Gastroenterology;  Laterality: N/A;   ESOPHAGOGASTRODUODENOSCOPY (EGD) WITH PROPOFOL N/A 07/18/2020   Procedure: ESOPHAGOGASTRODUODENOSCOPY (EGD) WITH PROPOFOL;  Surgeon: [name], MD;  Location: [facility];  Service: Gastroenterology;  Laterality: N/A;   ESOPHAGOGASTRODUODENOSCOPY (EGD) WITH PROPOFOL N/A 01/23/2021   Procedure: ESOPHAGOGASTRODUODENOSCOPY (EGD) WITH PROPOFOL;  Surgeon: Rush Landmark Telford Nab., MD;  Location: [facility];  Service: Gastroenterology;  Laterality: N/A;   EUS N/A 01/23/2021   Procedure: UPPER ENDOSCOPIC ULTRASOUND (EUS) RADIAL;  Surgeon: Irving Copas., MD;  Location: [facility];  Service: Gastroenterology;  Laterality: N/A;   HEMOSTASIS CLIP PLACEMENT  01/23/2021   Procedure: HEMOSTASIS CLIP PLACEMENT;  Surgeon: Irving Copas., MD;  Location: [facility];  Service: Gastroenterology;;   INTRAVASCULAR PRESSURE WIRE/FFR STUDY N/A 09/04/2019   Procedure: INTRAVASCULAR PRESSURE WIRE/FFR STUDY;  Surgeon: Nelva Bush, MD;  Location: [place] [facility];  Service: Cardiovascular;  Laterality: N/A;   LEFT HEART CATH AND CORONARY ANGIOGRAPHY N/A 03/18/2017   Procedure: Left Heart Cath and Coronary Angiography;  Surgeon: [name], MD;  Location: [place] [facility];   Service: Cardiovascular;  Laterality: N/A;   LEFT HEART CATH AND CORONARY ANGIOGRAPHY N/A 09/04/2019   Procedure: LEFT HEART CATH AND CORONARY ANGIOGRAPHY;  Surgeon: Nelva Bush, MD;  Location: [place] [facility];  Service: Cardiovascular;  Laterality: N/A;   POLYPECTOMY  07/18/2020   Procedure: POLYPECTOMY;  Surgeon: [name], MD;  Location: [facility];  Service: Gastroenterology;;   REDUCTION MAMMAPLASTY Bilateral ~ [place] INJECTION  01/23/2021   Procedure: SUBMUCOSAL LIFTING INJECTION;  Surgeon: Irving Copas., MD;  Location: [facility];  Service: Gastroenterology;;   TONSILLECTOMY      SOCIAL HISTORY: Social History   Socioeconomic History   Marital status: Divorced    Spouse name: Not on file   Number of children: 3   Years of education: doctorate   Highest education level: Not on file  Occupational History   Occupation: Teacher/retired  Tobacco Use   Smoking status: Never   Smokeless tobacco: Never  Vaping Use   Vaping Use: Never used  Substance and Sexual Activity   Alcohol use: No   Drug use: No   Sexual activity: Not Currently  Other Topics  Concern   Not on file  Social History Narrative   Caffeine use-yes   Regular exercise-no   Social Determinants of Health   Financial Resource Strain: Not on file  Food Insecurity: Not on file  Transportation Needs: Not on file  Physical Activity: Not on file  Stress: Not on file  Social Connections: Not on file  Intimate Partner Violence: Not on file    FAMILY HISTORY: Family History  Problem Relation Age of Onset   Diabetes Mother    Heart attack Mother 76       2007   Hypertension Mother    Hyperlipidemia Mother    Heart disease Mother    Heart attack Father    Stroke Father 45   Hypertension Father    Hyperlipidemia Father    Cancer Sister        Gastric cancer in 2013   Stomach cancer Sister        in the cavity behind the stomach   Diabetes Sister    Diabetes  Sister    Diabetes Sister    Migraines Daughter        brain surgery   Migraines Daughter    Hypertension Son        weight loss corrected that   Breast cancer Neg Hx    Colon cancer Neg Hx    Inflammatory bowel disease Neg Hx    Liver disease Neg Hx    Esophageal cancer Neg Hx    Pancreatic cancer Neg Hx    Rectal cancer Neg Hx     ALLERGIES:  is allergic to iodinated contrast media and sulfa antibiotics.  MEDICATIONS:  Current Outpatient Medications  Medication Sig Dispense Refill   amLODipine (NORVASC) 10 MG tablet Take 1 tablet (10 mg total) by mouth at bedtime.     aspirin EC 81 MG tablet Take 1 tablet (81 mg total) by mouth daily. Swallow whole. 30 tablet 11   atorvastatin (LIPITOR) 80 MG tablet TAKE 1 TABLET BY MOUTH DAILY AT 6 PM. (Patient taking differently: Take 80 mg by mouth every evening.) 90 tablet 3   carvedilol (COREG) 25 MG tablet Take 2 tablets (50 mg total) by mouth 2 (two) times daily with a meal. Please make overdue appt with Dr. Harrington Challenger before anymore refills. Thank you 3rd and Final attempt 60 tablet 0   Cholecalciferol (VITAMIN D3) 50 MCG (2000 UT) TABS Take 2,000 Units by mouth daily.     clopidogrel (PLAVIX) 75 MG tablet TAKE 1 TABLET BY MOUTH EVERY DAY 15 tablet 0   doxylamine, Sleep, (UNISOM) 25 MG tablet Take 25 mg by mouth at bedtime.     Dulaglutide (TRULICITY) 2.90 SX/1.1BZ SOPN Inject 0.75 mg into the skin once a week. 2 mL 1   Evolocumab (REPATHA SURECLICK) 208 MG/ML SOAJ INJECT 1 PEN INTO THE SKIN EVERY 14 (FOURTEEN) DAYS. (Patient taking differently: Inject 140 mg into the skin every 14 (fourteen) days.) 2 mL 11   ferrous sulfate 325 (65 FE) MG tablet TAKE 1 TABLET BY MOUTH EVERY DAY WITH BREAKFAST (Patient taking differently: Take 325 mg by mouth daily with breakfast.) 90 tablet 0   glucose blood (ONETOUCH ULTRA) test strip 1 each by Other route 2 (two) times daily. LAST REFILL SCHEDULE PHYSICAL 200 strip 0   isosorbide mononitrate (IMDUR) 60 MG 24 hr  tablet Take 1 tablet (60 mg total) by mouth daily. Please make overdue appt with Dr. Harrington Challenger before anymore refills. Thank you 2nd attempt 15 tablet  0   Magnesium 200 MG TABS Take 200 mg by mouth daily.     nitroGLYCERIN (NITROSTAT) 0.4 MG SL tablet Place 1 tablet (0.4 mg total) under the tongue every 5 (five) minutes as needed for chest pain. 25 tablet 3   omeprazole (PRILOSEC) 40 MG capsule TAKE 1 CAPSULE BY MOUTH EVERY DAY (Patient taking differently: Take 40 mg by mouth daily before breakfast.) 90 capsule 1   ondansetron (ZOFRAN-ODT) 4 MG disintegrating tablet TAKE 1 TABLET BY MOUTH EVERY 8 HOURS AS NEEDED FOR NAUSEA AND VOMITING (Patient taking differently: Take 4 mg by mouth every 8 (eight) hours as needed for vomiting or nausea (dissolve orally).) 30 tablet 0   vitamin B-12 (CYANOCOBALAMIN) 500 MCG tablet Take 2 tablets (1,000 mcg total) by mouth daily.     No current facility-administered medications for this visit.    REVIEW OF SYSTEMS:   Constitutional: ( - ) fevers, ( - )  chills , ( - ) night sweats Eyes: ( - ) blurriness of vision, ( - ) double vision, ( - ) watery eyes Ears, nose, mouth, throat, and face: ( - ) mucositis, ( - ) sore throat Respiratory: ( - ) cough, ( - ) dyspnea, ( - ) wheezes Cardiovascular: ( - ) palpitation, ( - ) chest discomfort, ( + ) lower extremity swelling Gastrointestinal:  ( - ) nausea, ( - ) heartburn, ( - ) change in bowel habits Skin: ( - ) abnormal skin rashes Lymphatics: ( - ) new lymphadenopathy, ( - ) easy bruising Neurological: ( - ) numbness, ( - ) tingling, ( - ) new weaknesses Behavioral/Psych: ( - ) mood change, ( - ) new changes  All other systems were reviewed with the patient and are negative.  PHYSICAL EXAMINATION: ECOG PERFORMANCE STATUS: 1 - Symptomatic but completely ambulatory  Vitals:   06/14/22 1023  BP: (!) 155/73  Pulse: 71  Resp: 15  Temp: 97.9 F (36.6 C)  SpO2: 100%     Filed Weights   06/14/22 1023  Weight: 284  lb 8 oz (129 kg)      GENERAL: well appearing African American female in NAD, obese SKIN: skin color, texture, turgor are normal, no rashes or significant lesions EYES: conjunctiva are pink and non-injected, sclera clear LUNGS: clear to auscultation and percussion with normal breathing effort HEART: regular rate & rhythm and no murmurs and no lower extremity edema Musculoskeletal: no cyanosis of digits and no clubbing  PSYCH: alert & oriented x 3, fluent speech NEURO: no focal motor/sensory deficits  LABORATORY DATA:  I have reviewed the data as listed    Latest Ref Rng & Units 06/14/2022   10:01 AM 04/20/2022   11:29 AM 04/18/2022    5:38 AM  CBC  WBC 4.0 - 10.5 K/uL 4.0  4.3  5.0   Hemoglobin 12.0 - 15.0 g/dL 9.7  9.4  9.1   Hematocrit 36.0 - 46.0 % 32.3  30.6  31.1   Platelets 150 - 400 K/uL 230  212.0  203        Latest Ref Rng & Units 06/14/2022   10:01 AM 04/20/2022   11:29 AM 04/18/2022    5:38 AM  CMP  Glucose 70 - 99 mg/dL 153  163  87   BUN 8 - 23 mg/dL 27  35  34   Creatinine 0.44 - 1.00 mg/dL 2.27  2.47  2.47   Sodium 135 - 145 mmol/L 140  141  140  Potassium 3.5 - 5.1 mmol/L 4.4  4.7  4.2   Chloride 98 - 111 mmol/L 105  105  109   CO2 22 - 32 mmol/L _0 Calcium 8.9 - 10.3 mg/dL 8.8  9.3  8.9   Total Protein 6.5 - 8.1 g/dL 6.8  6.8  6.1   Total Bilirubin 0.3 - 1.2 mg/dL 0.5  0.5  0.7   Alkaline Phos 38 - 126 U/L 101  81  69   AST 15 - 41 U/L _1 ALT 0 - 44 U/L _2 ASSESSMENT & PLAN Alicia [name] is a 76 y.o. female presenting to the clinic for follow up of microcytic anemia.   #Microcytic Anemia #Anemia in Setting of CKD  --Multifactorial in the setting of CKD, malabsorption of iron and B12 due to history of gastric bypass.  --previously receiving Retacrit 20,000 Units every 2 weeks, last dose on 08/30/2021. Defer restarting to nephrology. Hgb target with retacrit is 10.  --Patient denies any signs of bleeding. She underwent  colonoscopy in August 2021 and EGD in March 2022 without any evidence of active or recent bleeding.  --Patient received IV venofer once weekly x 5 doses from 04/28/2021-05/26/2021. --Labs today show Hgb 9.7, iron studies pending.  --Currently on ferrous sulfate 325 mg daily and vitamin B12 1000 mcg twice daily. Recommend to continue.  --No indication for bone marrow biopsy at this time.  --RTC  in 3 months with labs.   No orders of the defined types were placed in this encounter.   All questions were answered. The patient knows to call the clinic with any problems, questions or concerns.  I have spent a total of 30 minutes minutes of face-to-face and non-face-to-face time, preparing to see the patient, performing a medically appropriate examination, counseling and educating the patient, ordering tests, documenting clinical information in the electronic health record, and care coordination.   Ledell Peoples, MD Department of Hematology/Oncology [place] at [facility] Phone: [phone] Pager: [phone] Email: [email or web address]

## 2022-06-16 ENCOUNTER — Encounter (HOSPITAL_COMMUNITY): Payer: Self-pay

## 2022-06-16 ENCOUNTER — Encounter: Payer: Self-pay | Admitting: Physician Assistant

## 2022-06-22 ENCOUNTER — Other Ambulatory Visit: Payer: Self-pay | Admitting: Internal Medicine

## 2022-06-22 DIAGNOSIS — I10 Essential (primary) hypertension: Secondary | ICD-10-CM

## 2022-06-26 ENCOUNTER — Other Ambulatory Visit: Payer: Self-pay | Admitting: Internal Medicine

## 2022-06-27 ENCOUNTER — Encounter (HOSPITAL_COMMUNITY)
Admission: RE | Admit: 2022-06-27 | Discharge: 2022-06-27 | Disposition: A | Discharge status: Home / Self Care | Payer: Medicare PPO | Source: Ambulatory Visit | Attending: Nephrology | Admitting: Nephrology

## 2022-06-27 VITALS — BP 119/77 | HR 65 | Temp 97.2°F | Resp 16

## 2022-06-27 DIAGNOSIS — D509 Iron deficiency anemia, unspecified: Secondary | ICD-10-CM | POA: Diagnosis not present

## 2022-06-27 DIAGNOSIS — N189 Chronic kidney disease, unspecified: Secondary | ICD-10-CM | POA: Insufficient documentation

## 2022-06-27 DIAGNOSIS — D508 Other iron deficiency anemias: Secondary | ICD-10-CM | POA: Insufficient documentation

## 2022-06-27 LAB — POCT HEMOGLOBIN-HEMACUE: Hemoglobin: 9.5 g/dL — ABNORMAL LOW (ref 12.0–15.0)

## 2022-06-27 MED ORDER — EPOETIN ALFA 20000 UNIT/ML IJ SOLN
INTRAMUSCULAR | Status: AC
  Filled 2022-06-27: qty 1

## 2022-06-27 MED ORDER — EPOETIN ALFA 20000 UNIT/ML IJ SOLN
20000.0000 [IU] | INTRAMUSCULAR | Status: DC
  Administered 2022-06-27: 20000 [IU] via SUBCUTANEOUS

## 2022-07-05 ENCOUNTER — Encounter: Payer: Self-pay | Admitting: Physician Assistant

## 2022-07-05 ENCOUNTER — Ambulatory Visit: Payer: Medicare PPO | Admitting: Physician Assistant

## 2022-07-05 ENCOUNTER — Other Ambulatory Visit: Payer: Self-pay | Admitting: Internal Medicine

## 2022-07-05 VITALS — BP 114/70 | HR 83 | Ht 65.0 in | Wt 281.0 lb

## 2022-07-05 DIAGNOSIS — I10 Essential (primary) hypertension: Secondary | ICD-10-CM | POA: Diagnosis not present

## 2022-07-05 DIAGNOSIS — Z9861 Coronary angioplasty status: Secondary | ICD-10-CM | POA: Diagnosis not present

## 2022-07-05 DIAGNOSIS — E785 Hyperlipidemia, unspecified: Secondary | ICD-10-CM

## 2022-07-05 DIAGNOSIS — R0609 Other forms of dyspnea: Secondary | ICD-10-CM | POA: Diagnosis not present

## 2022-07-05 DIAGNOSIS — I5032 Chronic diastolic (congestive) heart failure: Secondary | ICD-10-CM

## 2022-07-05 DIAGNOSIS — I2510 Atherosclerotic heart disease of native coronary artery without angina pectoris: Secondary | ICD-10-CM

## 2022-07-05 NOTE — Patient Instructions (Signed)
Medication Instructions:  Your physician recommends that you continue on your current medications as directed. Please refer to the Current Medication list given to you today.  *If you need a refill on your cardiac medications before your next appointment, please call your pharmacy*   Testing/Procedures: Your physician has requested that you have a lexiscan myoview. For further information please visit [email or web address]. Please follow instruction sheet, as given.  Your physician has requested that you have an echocardiogram. Echocardiography is a painless test that uses sound waves to create images of your heart. It provides your doctor with information about the size and shape of your heart and how well your heart's chambers and valves are working. This procedure takes approximately one hour. There are no restrictions for this procedure.  Follow-Up: At [facility], you and your health needs are our priority.  As part of our continuing mission to provide you with exceptional heart care, we have created designated Provider Care Teams.  These Care Teams include your primary Cardiologist (physician) and Advanced Practice Providers (APPs -  Physician Assistants and Nurse Practitioners) who all work together to provide you with the care you need, when you need it.  Your next appointment:   4-8 week(s)  The format for your next appointment:   In Person  Provider:   Robbie Lis, PA-C, Nicholes Rough, PA-C, Melina Copa, PA-C, Ambrose Pancoast, NP, Cecilie Kicks, NP, Ermalinda Barrios, PA-C, Christen Bame, NP, or Richardson Dopp, PA-C      (Please schedule on a day that Dr. Harrington Challenger will be in the office)  Important Information About Sugar

## 2022-07-05 NOTE — Progress Notes (Signed)
Cardiology Office Note:    Date:  07/05/2022   ID:  Alicia [name], Alicia [name] December 06, 1945, MRN [id number]  PCP:  Michela Pitcher, NP  [facility] Cardiologist:  [name], MD  [facility] Electrophysiologist:  None   Chief Complaint: 20 months follow up   History of Present Illness:    Alicia [name] is a 76 y.o. female with a hx of CAD, HTN, HLD, CKD IV leading to chronic anemia, DM, obesity and CVA seen for follow up.   Hx of CAD Cath 10/2015 - DES to RCA  Cath 02/2017 - DES to diagonal 1, patent RCA, 45% mLAD  s/p ? POBA (mention on note but not on cath report) Last echo 01/2019 - LVEF 60-65% Cath 08/2019: Patent RCA and D1 stent, 50-60% mLAd and 50% ostial D1 , not significant by DFR >> medical Rx  Here today for follow up.  She reports worsening dyspnea on exertion.  She used to walk 30 minutes each day, now only able to walk for 15 minutes.  Sometimes she needs to stop what she is doing because of breathing issue.  No exertional chest pain or pressure.  Since last seen she has CKD and fluid/diuretic managed by [name].  Creatinine greater than 2 at baseline.  She denies orthopnea, PND, syncope, lower extremity edema or melena.  Past Medical History:  Diagnosis Date   Anemia    Arthritis    "right knee" (10/21/2015)   CAD (coronary artery disease)    a. Cath 10/21/15: s/p DES to RCA: moderate diffuse stenosis of mod diagonal, diffuse irregularity LCx and LAD. 2D echo 10/21/15: mod LVH, EF 60-65%, no RWMA, calcified MV, mod-severe LAE. 03/18/17 PCI with DES--> 1 diag   Cataract    CKD (chronic kidney disease), stage IV (HCC)    Diabetes ([place])    Essential hypertension    Family history of adverse reaction to anesthesia    "oldest sister, [name], related to brain formation at back of head; when they put her to sleep it's hard to wake her up"   GERD (gastroesophageal reflux disease)    History of blood transfusion    "related to ruptured tubal pregnancy"   Hyperlipidemia     Hypertensive heart disease    Morbid obesity ([place])    Stroke (cerebrum) ([place])    Tubular adenoma of colon    Type II diabetes mellitus ([place])     Past Surgical History:  Procedure Laterality Date   ABDOMINAL HYSTERECTOMY  1988   fibroid   [place]   BIOPSY  07/18/2020   Procedure: BIOPSY;  Surgeon: [name], MD;  Location: [facility];  Service: Gastroenterology;;   CARDIAC CATHETERIZATION N/A 10/21/2015   Procedure: Right/Left Heart Cath and Coronary Angiography;  Surgeon: [name], MD;  Location: [place] [facility];  Service: Cardiovascular;  Laterality: N/A;   CHOLECYSTECTOMY OPEN  1984   COLONOSCOPY WITH PROPOFOL N/A 07/18/2020   Procedure: COLONOSCOPY WITH PROPOFOL;  Surgeon: [name], MD;  Location: [facility];  Service: Gastroenterology;  Laterality: N/A;   CORONARY STENT INTERVENTION N/A 03/18/2017   Procedure: Coronary Stent Intervention;  Surgeon: [name], MD;  Location: [place] [facility];  Service: Cardiovascular;  Laterality: N/A;   CORONARY STENT PLACEMENT  03/18/2017   A OPTIMIZE STUDY Drug Eluting Stent (2.5 mm 18 mm - post-dilated to 2.7 mm) was successfully placed   ECTOPIC PREGNANCY SURGERY  ENDOSCOPIC MUCOSAL RESECTION N/A 01/23/2021   Procedure: ENDOSCOPIC MUCOSAL RESECTION;  Surgeon: Rush Landmark Telford Nab., MD;  Location: [facility];  Service: Gastroenterology;  Laterality: N/A;   ESOPHAGOGASTRODUODENOSCOPY (EGD) WITH PROPOFOL N/A 07/18/2020   Procedure: ESOPHAGOGASTRODUODENOSCOPY (EGD) WITH PROPOFOL;  Surgeon: [name], MD;  Location: [facility];  Service: Gastroenterology;  Laterality: N/A;   ESOPHAGOGASTRODUODENOSCOPY (EGD) WITH PROPOFOL N/A 01/23/2021   Procedure: ESOPHAGOGASTRODUODENOSCOPY (EGD) WITH PROPOFOL;  Surgeon: Rush Landmark Telford Nab., MD;  Location: [facility];  Service: Gastroenterology;  Laterality: N/A;   EUS N/A 01/23/2021   Procedure: UPPER ENDOSCOPIC ULTRASOUND (EUS) RADIAL;   Surgeon: Irving Copas., MD;  Location: [facility];  Service: Gastroenterology;  Laterality: N/A;   HEMOSTASIS CLIP PLACEMENT  01/23/2021   Procedure: HEMOSTASIS CLIP PLACEMENT;  Surgeon: Irving Copas., MD;  Location: [facility];  Service: Gastroenterology;;   INTRAVASCULAR PRESSURE WIRE/FFR STUDY N/A 09/04/2019   Procedure: INTRAVASCULAR PRESSURE WIRE/FFR STUDY;  Surgeon: Nelva Bush, MD;  Location: [place] [facility];  Service: Cardiovascular;  Laterality: N/A;   LEFT HEART CATH AND CORONARY ANGIOGRAPHY N/A 03/18/2017   Procedure: Left Heart Cath and Coronary Angiography;  Surgeon: [name], MD;  Location: [place] [facility];  Service: Cardiovascular;  Laterality: N/A;   LEFT HEART CATH AND CORONARY ANGIOGRAPHY N/A 09/04/2019   Procedure: LEFT HEART CATH AND CORONARY ANGIOGRAPHY;  Surgeon: Nelva Bush, MD;  Location: [place] [facility];  Service: Cardiovascular;  Laterality: N/A;   POLYPECTOMY  07/18/2020   Procedure: POLYPECTOMY;  Surgeon: [name], MD;  Location: [facility];  Service: Gastroenterology;;   REDUCTION MAMMAPLASTY Bilateral ~ [place] INJECTION  01/23/2021   Procedure: SUBMUCOSAL LIFTING INJECTION;  Surgeon: Irving Copas., MD;  Location: [facility];  Service: Gastroenterology;;   TONSILLECTOMY      Current Medications: Current Meds  Medication Sig   amLODipine (NORVASC) 10 MG tablet Take 1 tablet (10 mg total) by mouth at bedtime.   aspirin EC 81 MG tablet Take 1 tablet (81 mg total) by mouth daily. Swallow whole.   carvedilol (COREG) 25 MG tablet Take 2 tablets (50 mg total) by mouth 2 (two) times daily with a meal. Please make overdue appt with Dr. Harrington Challenger before anymore refills. Thank you 3rd and Final attempt   Cholecalciferol (VITAMIN D3) 50 MCG (2000 UT) TABS Take 2,000 Units by mouth daily.   clopidogrel (PLAVIX) 75 MG tablet Take 1 tablet (75 mg total) by mouth daily.   doxylamine, Sleep, (UNISOM) 25  MG tablet Take 25 mg by mouth at bedtime.   Dulaglutide (TRULICITY) 3.15 QM/0.8QP SOPN Inject 0.75 mg into the skin once a week.   Evolocumab (REPATHA SURECLICK) 619 MG/ML SOAJ INJECT 1 PEN INTO THE SKIN EVERY 14 (FOURTEEN) DAYS. (Patient taking differently: Inject 140 mg into the skin every 14 (fourteen) days.)   ferrous sulfate 325 (65 FE) MG tablet TAKE 1 TABLET BY MOUTH EVERY DAY WITH BREAKFAST (Patient taking differently: Take 325 mg by mouth daily with breakfast.)   glucose blood (ONETOUCH ULTRA) test strip 1 each by Other route 2 (two) times daily. LAST REFILL SCHEDULE PHYSICAL   isosorbide mononitrate (IMDUR) 60 MG 24 hr tablet Take 1 tablet (60 mg total) by mouth daily. Please make overdue appt with Dr. Harrington Challenger before anymore refills. Thank you 2nd attempt   Magnesium 200 MG TABS Take 200 mg by mouth daily.   nitroGLYCERIN (NITROSTAT) 0.4 MG SL tablet Place 1 tablet (0.4 mg total) under the tongue  every 5 (five) minutes as needed for chest pain.   omeprazole (PRILOSEC) 40 MG capsule TAKE 1 CAPSULE BY MOUTH EVERY DAY (Patient taking differently: Take 40 mg by mouth daily before breakfast.)   ondansetron (ZOFRAN-ODT) 4 MG disintegrating tablet TAKE 1 TABLET BY MOUTH EVERY 8 HOURS AS NEEDED FOR NAUSEA AND VOMITING (Patient taking differently: Take 4 mg by mouth every 8 (eight) hours as needed for vomiting or nausea (dissolve orally).)   vitamin B-12 (CYANOCOBALAMIN) 500 MCG tablet Take 2 tablets (1,000 mcg total) by mouth daily.     Allergies:   Iodinated contrast media and Sulfa antibiotics   Social History   Socioeconomic History   Marital status: Divorced    Spouse name: Not on file   Number of children: 3   Years of education: doctorate   Highest education level: Not on file  Occupational History   Occupation: Teacher/retired  Tobacco Use   Smoking status: Never   Smokeless tobacco: Never  Vaping Use   Vaping Use: Never used  Substance and Sexual Activity   Alcohol use: No    Drug use: No   Sexual activity: Not Currently  Other Topics Concern   Not on file  Social History Narrative   Caffeine use-yes   Regular exercise-no   Social Determinants of Health   Financial Resource Strain: Not on file  Food Insecurity: Not on file  Transportation Needs: Not on file  Physical Activity: Not on file  Stress: Not on file  Social Connections: Not on file     Family History: The patient's family history includes Cancer in her sister; Diabetes in her mother, sister, sister, and sister; Heart attack in her father; Heart attack (age of onset: 12) in her mother; Heart disease in her mother; Hyperlipidemia in her father and mother; Hypertension in her father, mother, and son; Migraines in her daughter and daughter; Stomach cancer in her sister; Stroke (age of onset: 46) in her father. There is no history of Breast cancer, Colon cancer, Inflammatory bowel disease, Liver disease, Esophageal cancer, Pancreatic cancer, or Rectal cancer.    ROS:   Please see the history of present illness.    All other systems reviewed and are negative.   EKGs/Labs/Other Studies Reviewed:    The following studies were reviewed today:  Echo 01/2019  1. The left ventricle has normal systolic function with an ejection  fraction of 60-65%. The cavity size was normal. There is severely  increased left ventricular wall thickness. Left ventricular diastology  could not be evaluated due to nondiagnostic  images.   2. The right ventricle has normal systolic function. The cavity was  normal. There is no increase in right ventricular wall thickness.   3. The mitral valve is normal in structure.   4. The tricuspid valve is normal in structure.   5. The aortic valve is tricuspid Mild sclerosis of the aortic valve.   6. The pulmonic valve was normal in structure.   7. The inferior vena cava was dilated in size with >50% respiratory  variability.    INTRAVASCULAR PRESSURE WIRE/FFR STUDY  08/2019   LEFT HEART CATH AND CORONARY ANGIOGRAPHY   Conclusion  Conclusions: Moderate, non-obstructive coronary artery disease.  Most severe lesions are 50-60% mid LAD and 50% ostial D1 stenoses, which are not hemodynamically significant by DFR. Widely patent D1 and proximal RCA stents. Normal left ventricular filling pressure.   Recommendations: Optimize medical therapy; I will increase carvedilol to 50 mg BID. Check BMP next  week (09/07/2019).  Metformin should be held pending BMP, given CKD stage 3 and today's contrast exposure. Aggressive secondary prevention.  Improved blood pressure control and weight loss would likely be beneficial.  Diagnostic Dominance: Right   EKG:  EKG is not  ordered today.  Recent Labs: 04/04/2022: TSH 0.99 04/18/2022: Magnesium 2.1 04/20/2022: Pro B Natriuretic peptide (BNP) 111.0 06/14/2022: ALT 6; BUN 27; Creatinine 2.27; Platelet Count 230; Potassium 4.4; Sodium 140 06/27/2022: Hemoglobin 9.5  Recent Lipid Panel    Component Value Date/Time   CHOL 169 11/06/2021 1525   CHOL 94 (L) 12/12/2020 1035   TRIG 91.0 11/06/2021 1525   HDL 47.50 11/06/2021 1525   HDL 41 12/12/2020 1035   CHOLHDL 4 11/06/2021 1525   VLDL 18.2 11/06/2021 1525   LDLCALC 104 (H) 11/06/2021 1525   LDLCALC 41 12/12/2020 1035    Physical Exam:    VS:  BP 114/70   Pulse 83   Ht '5\' 5"'$  (1.651 m)   Wt 281 lb (127.5 kg)   SpO2 98%   BMI 46.76 kg/m     Wt Readings from Last 3 Encounters:  07/05/22 281 lb (127.5 kg)  06/14/22 284 lb 8 oz (129 kg)  05/29/22 296 lb 8 oz (134.5 kg)     GEN:  Well nourished, well developed in no acute distress HEENT: Normal NECK: No JVD; No carotid bruits LYMPHATICS: No lymphadenopathy CARDIAC: RRR, no murmurs, rubs, gallops RESPIRATORY:  Clear to auscultation without rales, wheezing or rhonchi  ABDOMEN: Soft, non-tender, non-distended MUSCULOSKELETAL:  No edema; No deformity  SKIN: Warm and dry NEUROLOGIC:  Alert and oriented x 3 PSYCHIATRIC:   Normal affect   ASSESSMENT AND PLAN:    1.  Dyspnea on exertion Gradually worsening.  Exertional limitation.  This could be multifactorial from obesity, chronic anemia, deconditioning, angina or cardiomyopathy.  Her anemia is managed by oncology.  Fluids/diuretics managed by nephrology.  Last cardiac catheterization in 2020 showed mildly progression of LAD disease.  Will get Lexiscan to rule out any ischemia.  Update echocardiogram to assess wall motion and LV function.  Likely to avoid cath given renal function.  No change in medical therapy today.  Shared Decision Making/Informed Consent The risks [chest pain, shortness of breath, cardiac arrhythmias, dizziness, blood pressure fluctuations, myocardial infarction, stroke/transient ischemic attack, nausea, vomiting, allergic reaction, radiation exposure, metallic taste sensation and life-threatening complications (estimated to be 1 in 10,000)], benefits (risk stratification, diagnosing coronary artery disease, treatment guidance) and alternatives of a nuclear stress test were discussed in detail with Ms. Guarisco and she agrees to proceed.   2.  CAD -Last Cath 08/2019: Patent RCA and D1 stent, 50-60% mLAd and 50% ostial D1 , not significant by DFR >> medical Rx - AS above -Continue aspirin, statin, Imdur and beta-blocker.  3.  Chronic diastolic heart failure -Diuretics and fluid managed by [name] given CKD -Continue Lasix and spironolactone -Fluid status stable  4.  Hyperlipidemia -Continue Repatha - ?  Taking Lipitor  5. HTN - BP stable   Medication Adjustments/Labs and Tests Ordered: Current medicines are reviewed at length with the patient today.  Concerns regarding medicines are outlined above.  Orders Placed This Encounter  Procedures   Cardiac Stress Test: Informed Consent Details: Physician/Practitioner Attestation; Transcribe to consent form and obtain patient signature   MYOCARDIAL PERFUSION IMAGING   ECHOCARDIOGRAM  COMPLETE   No orders of the defined types were placed in this encounter.   Patient Instructions  Medication Instructions:  Your  physician recommends that you continue on your current medications as directed. Please refer to the Current Medication list given to you today.  *If you need a refill on your cardiac medications before your next appointment, please call your pharmacy*   Testing/Procedures: Your physician has requested that you have a lexiscan myoview. For further information please visit [email or web address]. Please follow instruction sheet, as given.  Your physician has requested that you have an echocardiogram. Echocardiography is a painless test that uses sound waves to create images of your heart. It provides your doctor with information about the size and shape of your heart and how well your heart's chambers and valves are working. This procedure takes approximately one hour. There are no restrictions for this procedure.  Follow-Up: At [facility], you and your health needs are our priority.  As part of our continuing mission to provide you with exceptional heart care, we have created designated Provider Care Teams.  These Care Teams include your primary Cardiologist (physician) and Advanced Practice Providers (APPs -  Physician Assistants and Nurse Practitioners) who all work together to provide you with the care you need, when you need it.  Your next appointment:   4-8 week(s)  The format for your next appointment:   In Person  Provider:   Robbie Lis, PA-C, Nicholes Rough, PA-C, Melina Copa, PA-C, Ambrose Pancoast, NP, Cecilie Kicks, NP, Ermalinda Barrios, PA-C, Christen Bame, NP, or Richardson Dopp, PA-C      (Please schedule on a day that Dr. Harrington Challenger will be in the office)  Important Information About Sugar         Jarrett Soho, [place]  07/05/2022 10:48 AM    [place]

## 2022-07-06 NOTE — Telephone Encounter (Signed)
This was already addressed in another note.

## 2022-07-08 ENCOUNTER — Other Ambulatory Visit: Payer: Self-pay | Admitting: Internal Medicine

## 2022-07-08 DIAGNOSIS — I10 Essential (primary) hypertension: Secondary | ICD-10-CM

## 2022-07-11 ENCOUNTER — Encounter (HOSPITAL_COMMUNITY)
Admission: RE | Admit: 2022-07-11 | Discharge: 2022-07-11 | Disposition: A | Discharge status: Home / Self Care | Payer: Medicare PPO | Source: Ambulatory Visit | Attending: Nephrology | Admitting: Nephrology

## 2022-07-11 VITALS — BP 128/74 | HR 64 | Temp 97.2°F | Resp 18

## 2022-07-11 DIAGNOSIS — D508 Other iron deficiency anemias: Secondary | ICD-10-CM

## 2022-07-11 DIAGNOSIS — D509 Iron deficiency anemia, unspecified: Secondary | ICD-10-CM

## 2022-07-11 DIAGNOSIS — N189 Chronic kidney disease, unspecified: Secondary | ICD-10-CM | POA: Diagnosis not present

## 2022-07-11 LAB — POCT HEMOGLOBIN-HEMACUE: Hemoglobin: 10.9 g/dL — ABNORMAL LOW (ref 12.0–15.0)

## 2022-07-11 MED ORDER — EPOETIN ALFA 20000 UNIT/ML IJ SOLN
INTRAMUSCULAR | Status: AC
  Filled 2022-07-11: qty 1

## 2022-07-11 MED ORDER — EPOETIN ALFA 20000 UNIT/ML IJ SOLN
20000.0000 [IU] | INTRAMUSCULAR | Status: DC
  Administered 2022-07-11: 20000 [IU] via SUBCUTANEOUS

## 2022-07-12 ENCOUNTER — Other Ambulatory Visit: Payer: Self-pay | Admitting: Internal Medicine

## 2022-07-18 ENCOUNTER — Telehealth: Payer: Self-pay

## 2022-07-18 NOTE — Telephone Encounter (Signed)
Attempted to contact the patient. No answer, will try again later. S.Ananda Caya EMTP

## 2022-07-24 ENCOUNTER — Ambulatory Visit (HOSPITAL_COMMUNITY): Payer: Medicare PPO | Attending: Physician Assistant

## 2022-07-24 ENCOUNTER — Ambulatory Visit: Payer: Medicare PPO

## 2022-07-24 ENCOUNTER — Ambulatory Visit (HOSPITAL_BASED_OUTPATIENT_CLINIC_OR_DEPARTMENT_OTHER): Payer: Medicare PPO

## 2022-07-24 DIAGNOSIS — I2510 Atherosclerotic heart disease of native coronary artery without angina pectoris: Secondary | ICD-10-CM | POA: Diagnosis not present

## 2022-07-24 DIAGNOSIS — R0609 Other forms of dyspnea: Secondary | ICD-10-CM

## 2022-07-24 DIAGNOSIS — Z9861 Coronary angioplasty status: Secondary | ICD-10-CM | POA: Insufficient documentation

## 2022-07-24 LAB — ECHOCARDIOGRAM COMPLETE
Area-P 1/2: 3.85 cm2
Height: 65 in
S' Lateral: 3.6 cm
Weight: 4496 oz

## 2022-07-24 MED ORDER — REGADENOSON 0.4 MG/5ML IV SOLN
0.4000 mg | Freq: Once | INTRAVENOUS | Status: AC
  Administered 2022-07-24: 0.4 mg via INTRAVENOUS

## 2022-07-24 MED ORDER — TECHNETIUM TC 99M TETROFOSMIN IV KIT
31.6000 | PACK | Freq: Once | INTRAVENOUS | Status: AC | PRN
  Administered 2022-07-24: 31.6 via INTRAVENOUS

## 2022-07-25 ENCOUNTER — Other Ambulatory Visit: Payer: Self-pay | Admitting: Internal Medicine

## 2022-07-25 ENCOUNTER — Other Ambulatory Visit: Payer: Self-pay | Admitting: Nurse Practitioner

## 2022-07-25 ENCOUNTER — Encounter (HOSPITAL_COMMUNITY)
Admission: RE | Admit: 2022-07-25 | Discharge: 2022-07-25 | Disposition: A | Discharge status: Home / Self Care | Payer: Medicare PPO | Source: Ambulatory Visit | Attending: Nephrology | Admitting: Nephrology

## 2022-07-25 ENCOUNTER — Ambulatory Visit (HOSPITAL_COMMUNITY): Payer: Medicare PPO | Attending: Physician Assistant

## 2022-07-25 VITALS — BP 130/71 | HR 67 | Resp 17

## 2022-07-25 DIAGNOSIS — N184 Chronic kidney disease, stage 4 (severe): Secondary | ICD-10-CM | POA: Insufficient documentation

## 2022-07-25 DIAGNOSIS — D508 Other iron deficiency anemias: Secondary | ICD-10-CM | POA: Diagnosis not present

## 2022-07-25 DIAGNOSIS — E119 Type 2 diabetes mellitus without complications: Secondary | ICD-10-CM

## 2022-07-25 DIAGNOSIS — D509 Iron deficiency anemia, unspecified: Secondary | ICD-10-CM | POA: Diagnosis not present

## 2022-07-25 DIAGNOSIS — E7800 Pure hypercholesterolemia, unspecified: Secondary | ICD-10-CM | POA: Insufficient documentation

## 2022-07-25 DIAGNOSIS — I10 Essential (primary) hypertension: Secondary | ICD-10-CM | POA: Diagnosis not present

## 2022-07-25 DIAGNOSIS — I2510 Atherosclerotic heart disease of native coronary artery without angina pectoris: Secondary | ICD-10-CM | POA: Diagnosis not present

## 2022-07-25 DIAGNOSIS — I5032 Chronic diastolic (congestive) heart failure: Secondary | ICD-10-CM | POA: Insufficient documentation

## 2022-07-25 DIAGNOSIS — R404 Transient alteration of awareness: Secondary | ICD-10-CM | POA: Insufficient documentation

## 2022-07-25 LAB — MYOCARDIAL PERFUSION IMAGING
LV dias vol: 69 mL (ref 46–106)
LV sys vol: 23 mL
Nuc Stress EF: 66 %
Peak HR: 76 {beats}/min
Rest HR: 63 {beats}/min
Rest Nuclear Isotope Dose: 30.6 mCi
SDS: 4
SRS: 1
SSS: 5
ST Depression (mm): 0 mm
Stress Nuclear Isotope Dose: 31.6 mCi
TID: 1.06

## 2022-07-25 MED ORDER — EPOETIN ALFA 20000 UNIT/ML IJ SOLN
20000.0000 [IU] | INTRAMUSCULAR | Status: DC
  Administered 2022-07-25: 20000 [IU] via SUBCUTANEOUS

## 2022-07-25 MED ORDER — EPOETIN ALFA 20000 UNIT/ML IJ SOLN
INTRAMUSCULAR | Status: AC
  Filled 2022-07-25: qty 1

## 2022-07-25 MED ORDER — TECHNETIUM TC 99M TETROFOSMIN IV KIT
30.6000 | PACK | Freq: Once | INTRAVENOUS | Status: AC | PRN
  Administered 2022-07-25: 30.6 via INTRAVENOUS

## 2022-07-26 ENCOUNTER — Other Ambulatory Visit: Payer: Self-pay

## 2022-07-26 NOTE — Telephone Encounter (Signed)
Per OV 07/05/22 w/Bhaghat, PA: 3.  Chronic diastolic heart failure -Diuretics and fluid managed by [name] given CKD -Continue Lasix and spironolactone -Fluid status stable  Refill sent to pharmacy.

## 2022-07-30 ENCOUNTER — Ambulatory Visit: Payer: Medicare PPO | Admitting: Nurse Practitioner

## 2022-07-30 VITALS — BP 128/62 | HR 75 | Temp 97.0°F | Resp 14 | Ht 65.0 in | Wt 279.5 lb

## 2022-07-30 DIAGNOSIS — E119 Type 2 diabetes mellitus without complications: Secondary | ICD-10-CM

## 2022-07-30 DIAGNOSIS — N184 Chronic kidney disease, stage 4 (severe): Secondary | ICD-10-CM | POA: Diagnosis not present

## 2022-07-30 DIAGNOSIS — Z23 Encounter for immunization: Secondary | ICD-10-CM

## 2022-07-30 DIAGNOSIS — I10 Essential (primary) hypertension: Secondary | ICD-10-CM | POA: Diagnosis not present

## 2022-07-30 LAB — POCT GLYCOSYLATED HEMOGLOBIN (HGB A1C): Hemoglobin A1C: 6.7 % — AB (ref 4.0–5.6)

## 2022-07-30 NOTE — Assessment & Plan Note (Signed)
Patient currently maintained on amlodipine, carvedilol, isosorbide mononitrate.  Blood pressure within normal limits continue taking medication as prescribed.

## 2022-07-30 NOTE — Progress Notes (Signed)
Established Patient Office Visit  Subjective   Patient ID: Alicia [name], female    DOB: 1946/08/06  Age: 76 y.o. MRN: [id number]  Chief Complaint  Patient presents with   Diabetes    Follow up-sugar readings have been fasting 136, 154      DM2: Tolerating the trulicity well. She is checking her glucose at home Sugars 136-154 these readings are all prior to eating No hypoglycemia per patient report  HTN: Patient blood pressure within normal limits today in office.  She currently maintained on amlodipine, carvedilol, isosorbide mononitrate.  Patient recently underwent an echocardiogram and a chemical stress test on 07/25/2022.     Review of Systems  Constitutional:  Negative for chills and fever.  Respiratory:  Positive for shortness of breath.   Cardiovascular:  Negative for chest pain and leg swelling.  Gastrointestinal:        BM daily  Neurological:  Negative for headaches.      Objective:     BP 128/62   Pulse 75   Temp (!) 97 F (36.1 C)   Resp 14   Ht '5\' 5"'$  (1.651 m)   Wt 279 lb 8 oz (126.8 kg)   SpO2 98%   BMI 46.51 kg/m  BP Readings from Last 3 Encounters:  07/30/22 128/62  07/25/22 130/71  07/11/22 128/74   Wt Readings from Last 3 Encounters:  07/30/22 279 lb 8 oz (126.8 kg)  07/24/22 281 lb (127.5 kg)  07/05/22 281 lb (127.5 kg)      Physical Exam Vitals and nursing note reviewed.  Constitutional:      Appearance: Normal appearance. She is obese.  Cardiovascular:     Rate and Rhythm: Normal rate and regular rhythm.     Pulses:          Dorsalis pedis pulses are 2+ on the right side and 2+ on the left side.     Heart sounds: Normal heart sounds.  Pulmonary:     Effort: Pulmonary effort is normal.     Breath sounds: Normal breath sounds.  Musculoskeletal:     Right lower leg: No edema.     Left lower leg: No edema.  Feet:     Right foot:     Skin integrity: Dry skin (plantar surface) present.     Left foot:     Skin integrity: Dry  skin (plantar surface) present.  Skin:    General: Skin is warm.  Neurological:     Mental Status: She is alert.      Results for orders placed or performed in visit on 07/30/22  POCT glycosylated hemoglobin (Hb A1C)  Result Value Ref Range   Hemoglobin A1C 6.7 (A) 4.0 - 5.6 %   HbA1c POC (<> result, manual entry)     HbA1c, POC (prediabetic range)     HbA1c, POC (controlled diabetic range)        The ASCVD Risk score (Arnett DK, et al., 2019) failed to calculate for the following reasons:   The patient has a prior MI or stroke diagnosis    Assessment & Plan:   Problem List Items Addressed This Visit       Cardiovascular and Mediastinum   Essential hypertension    Patient currently maintained on amlodipine, carvedilol, isosorbide mononitrate.  Blood pressure within normal limits continue taking medication as prescribed.        Endocrine   Type 2 diabetes mellitus without complication, without long-term current use of insulin ([place]) -  Primary    A1c at goal below 7.  Patient did have history of hypoglycemia that required hospitalization.  Currently maintained on Trulicity 1.68 mg weekly and tolerating well.  Continue medication as prescribed      Relevant Orders   POCT glycosylated hemoglobin (Hb A1C) (Completed)     Genitourinary   CKD (chronic kidney disease), stage IV ([place])    Patient is still being followed by [facility].      Other Visit Diagnoses     Need for influenza vaccination       Relevant Orders   Flu Vaccine QUAD High Dose(Fluad)       Return in about 4 months (around 11/29/2022) for CPE and Labs.    Romilda Garret, NP

## 2022-07-30 NOTE — Assessment & Plan Note (Signed)
A1c at goal below 7.  Patient did have history of hypoglycemia that required hospitalization.  Currently maintained on Trulicity 1.90 mg weekly and tolerating well.  Continue medication as prescribed

## 2022-07-30 NOTE — Assessment & Plan Note (Signed)
Patient is still being followed by [facility].

## 2022-07-30 NOTE — Patient Instructions (Addendum)
Nice to see you today I want to see you in 4 months, sooner if you need me We will do your physical at that point. This includes labs. I want you fasting so just water or black coffee that morning

## 2022-08-02 LAB — POCT HEMOGLOBIN-HEMACUE: Hemoglobin: 11.3 g/dL — ABNORMAL LOW (ref 12.0–15.0)

## 2022-08-07 NOTE — Progress Notes (Deleted)
Cardiology Office Note:    Date:  08/07/2022   ID:  Alicia [name], Alicia [name] November 30, 1945, MRN [id number]  PCP:  Alicia Pitcher, NP  [place] [facility] Cardiologist:  Alicia Carnes, MD { Click to update primary MD,subspecialty MD or APP then REFRESH:1}  *** Referring MD: Alicia Pitcher, NP   Chief Complaint:  No chief complaint on file. {Click here for Visit Info    :1}   Patient Profile: Coronary artery disease  S/p DES to RCA in 10/2015 S/p DES to D1 in 02/2017 Cath 08/2019: RCA and D1 stent ok, mLAD 50-60, oD1 50 - Med Rx  (HFpEF) heart failure with preserved ejection fraction  Chronic kidney disease  Diabetes mellitus  Hypertension  Hyperlipidemia  Hx of CVA  GERD Obesity  Anemia of chronic disease Followed by Heme   Prior CV Studies: LEFT HEART CATH AND CORONARY ANGIOGRAPHY, LEFT HEART CATH AND CORONARY ANGIOGRAPHY 09/04/2019 Conclusions: 1. Moderate, non-obstructive coronary artery disease.  Most severe lesions are 50-60% mid LAD and 50% ostial D1 stenoses, which are not hemodynamically significant by DFR. 2. Widely patent D1 and proximal RCA stents. 3. Normal left ventricular filling pressure. Recommendations: 1. Optimize medical therapy; I will increase carvedilol to 50 mg BID. 2. Check BMP next week (09/07/2019).  Metformin should be held pending BMP, given CKD stage 3 and today's contrast exposure. 3. Aggressive secondary prevention.  Improved blood pressure control and weight loss would likely be beneficial.  GATED SPECT MYO PERF W/LEXISCAN STRESS 1D 07/25/2022 Normal perfusion. LVEF 66% with normal wall motion. This is a low risk study. No change compared to a prior study in 2006.   ECHO COMPLETE WO IMAGING ENHANCING AGENT 07/24/2022 IMPRESSIONS 1. Left ventricular ejection fraction, by estimation, is 60 to 65%. The left ventricle has normal function. The left ventricle has no regional wall motion abnormalities. There is mild asymmetric left ventricular  hypertrophy. Left ventricular diastolic parameters were normal. The average left ventricular global longitudinal strain is -19.7 %. The global longitudinal strain is normal. 2. Right ventricular systolic function is normal. The right ventricular size is normal. There is normal pulmonary artery systolic pressure. The estimated right ventricular systolic pressure is 09.3 mmHg. 3. Left atrial size was mild to moderately dilated. 4. The mitral valve is degenerative. Mild mitral valve regurgitation. No evidence of mitral stenosis. Moderate mitral annular calcification. 5. The aortic valve is tricuspid. Aortic valve regurgitation is not visualized. No aortic stenosis is present. 6. The inferior vena cava is dilated in size with >50% respiratory variability, suggesting right atrial pressure of 8 mmHg. the estimated right ventricular systolic pressure is 26.7 mmHg.  CARDIAC TELEMETRY MONITORING-INTERPRETATION ONLY 07/31/2017 Poor quality transmission on a few strips Sinus rhythm.  No arrhythmias detected    ***  History of Present Illness:   Alicia [name] is a 76 y.o. female with the above problem list.  She was seen by Alicia Kail, PA-C on 07/05/22 for evaluation of shortness of breath.  F/u Myoview was low risk and an echocardiogram showed normal EF. She returns for f/u. ***    Past Medical History:  Diagnosis Date   Anemia    Arthritis    "right knee" (10/21/2015)   CAD (coronary artery disease)    a. Cath 10/21/15: s/p DES to RCA: moderate diffuse stenosis of mod diagonal, diffuse irregularity LCx and LAD. 2D echo 10/21/15: mod LVH, EF 60-65%, no RWMA, calcified MV, mod-severe LAE. 03/18/17 PCI with DES--> 1 diag   Cataract  CKD (chronic kidney disease), stage IV (HCC)    Diabetes ([place])    Essential hypertension    Family history of adverse reaction to anesthesia    "oldest sister, Alicia [name], related to brain formation at back of head; when they put her to sleep it's hard to wake her up"    GERD (gastroesophageal reflux disease)    History of blood transfusion    "related to ruptured tubal pregnancy"   Hyperlipidemia    Hypertensive heart disease    Morbid obesity ([place])    Stroke (cerebrum) ([place])    Tubular adenoma of colon    Type II diabetes mellitus ([place])    Current Medications: No outpatient medications have been marked as taking for the 08/08/22 encounter (Appointment) with Alicia Dopp T, PA-C.    Allergies:   Iodinated contrast media and Sulfa antibiotics   Social History   Tobacco Use   Smoking status: Never   Smokeless tobacco: Never  Vaping Use   Vaping Use: Never used  Substance Use Topics   Alcohol use: No   Drug use: No    Family Hx: The patient's family history includes Cancer in her sister; Diabetes in her mother, sister, sister, and sister; Heart attack in her father; Heart attack (age of onset: 90) in her mother; Heart disease in her mother; Hyperlipidemia in her father and mother; Hypertension in her father, mother, and son; Migraines in her daughter and daughter; Stomach cancer in her sister; Stroke (age of onset: 15) in her father. There is no history of Breast cancer, Colon cancer, Inflammatory bowel disease, Liver disease, Esophageal cancer, Pancreatic cancer, or Rectal cancer.  ROS   EKGs/Labs/Other Test Reviewed:    EKG:  EKG is *** ordered today.  The ekg ordered today demonstrates ***  Recent Labs: 04/04/2022: TSH 0.99 04/18/2022: Magnesium 2.1 04/20/2022: Pro B Natriuretic peptide (BNP) 111.0 06/14/2022: ALT 6; BUN 27; Creatinine 2.27; Platelet Count 230; Potassium 4.4; Sodium 140 07/25/2022: Hemoglobin 11.3   Recent Lipid Panel Recent Labs    11/06/21 1525  CHOL 169  TRIG 91.0  HDL 47.50  VLDL 18.2  LDLCALC 104*     Risk Assessment/Calculations/Metrics:   {Does this patient have ATRIAL FIBRILLATION?[phone]}     No BP recorded.  {Refresh Note OR Click here to enter BP  :1}***    Physical Exam:    VS:  There were no  vitals taken for this visit.    Wt Readings from Last 3 Encounters:  07/30/22 279 lb 8 oz (126.8 kg)  07/24/22 281 lb (127.5 kg)  07/05/22 281 lb (127.5 kg)    Physical Exam ***     ASSESSMENT & PLAN:   No problem-specific Assessment & Plan notes found for this encounter.        {Are you ordering a CV Procedure (e.g. stress test, cath, DCCV, TEE, etc)?   Press F2        :[id number]}   Dispo:  No follow-ups on file.   Medication Adjustments/Labs and Tests Ordered: Current medicines are reviewed at length with the patient today.  Concerns regarding medicines are outlined above.  Tests Ordered: No orders of the defined types were placed in this encounter.  Medication Changes: No orders of the defined types were placed in this encounter.  Signed, Alicia Dopp, PA-C  08/07/2022 10:13 PM    [facility], [place], [address]  [address] Phone: [phone]; Fax: [phone]

## 2022-08-08 ENCOUNTER — Encounter (HOSPITAL_COMMUNITY)
Admission: RE | Admit: 2022-08-08 | Discharge: 2022-08-08 | Disposition: A | Discharge status: Home / Self Care | Payer: Medicare PPO | Source: Ambulatory Visit | Attending: Nephrology | Admitting: Nephrology

## 2022-08-08 ENCOUNTER — Ambulatory Visit: Payer: Medicare PPO | Admitting: Physician Assistant

## 2022-08-08 VITALS — BP 130/69 | HR 64 | Temp 97.8°F | Resp 12

## 2022-08-08 DIAGNOSIS — I10 Essential (primary) hypertension: Secondary | ICD-10-CM | POA: Diagnosis not present

## 2022-08-08 DIAGNOSIS — D508 Other iron deficiency anemias: Secondary | ICD-10-CM

## 2022-08-08 DIAGNOSIS — R404 Transient alteration of awareness: Secondary | ICD-10-CM | POA: Diagnosis not present

## 2022-08-08 DIAGNOSIS — N184 Chronic kidney disease, stage 4 (severe): Secondary | ICD-10-CM | POA: Diagnosis not present

## 2022-08-08 DIAGNOSIS — I2510 Atherosclerotic heart disease of native coronary artery without angina pectoris: Secondary | ICD-10-CM

## 2022-08-08 DIAGNOSIS — D509 Iron deficiency anemia, unspecified: Secondary | ICD-10-CM | POA: Diagnosis not present

## 2022-08-08 DIAGNOSIS — E7800 Pure hypercholesterolemia, unspecified: Secondary | ICD-10-CM | POA: Diagnosis not present

## 2022-08-08 DIAGNOSIS — I5032 Chronic diastolic (congestive) heart failure: Secondary | ICD-10-CM | POA: Diagnosis not present

## 2022-08-08 LAB — POCT HEMOGLOBIN-HEMACUE: Hemoglobin: 11.6 g/dL — ABNORMAL LOW (ref 12.0–15.0)

## 2022-08-08 MED ORDER — EPOETIN ALFA 20000 UNIT/ML IJ SOLN
INTRAMUSCULAR | Status: AC
  Administered 2022-08-08: 20000 [IU] via SUBCUTANEOUS
  Filled 2022-08-08: qty 1

## 2022-08-08 MED ORDER — EPOETIN ALFA 20000 UNIT/ML IJ SOLN: 20000.0000 [IU] | INTRAMUSCULAR | Status: DC

## 2022-08-12 DIAGNOSIS — I5032 Chronic diastolic (congestive) heart failure: Secondary | ICD-10-CM | POA: Insufficient documentation

## 2022-08-12 NOTE — Progress Notes (Signed)
Cardiology Office Note:    Date:  08/13/2022   ID:  Alicia [name], Alicia [name] Apr 17, 1946, MRN [id number]  PCP:  Michela Pitcher, NP  [place] [facility] Cardiologist:  [name], MD     Referring MD: Michela Pitcher, NP   Chief Complaint:  F/u for CAD, CHF    Patient Profile: Coronary artery disease  S/p DES to RCA in 10/2015 S/p DES to D1 in 02/2017 Cath 08/2019: RCA and D1 stent ok, mLAD 50-60, oD1 50 - Med Rx  Mild review 9/23: Low risk (HFpEF) heart failure with preserved ejection fraction  Echo 9/23: EF 60-65 Chronic kidney disease  Diabetes mellitus  Hypertension  Hyperlipidemia  Hx of CVA  GERD Obesity  Anemia of chronic disease Followed by Heme   Prior CV Studies: GATED SPECT MYO PERF W/LEXISCAN STRESS 1D 07/25/2022 Normal perfusion. LVEF 66% with normal wall motion. This is a low risk study. No change compared to a prior study in 2006.   ECHO COMPLETE WO IMAGING ENHANCING AGENT 07/24/2022 EF 60-65, no RWMA, mild asymmetric LVH, GLS -19.7, normal RVSF, normal PASP (RVSP 26.7), mild to moderate LAE, mild MR, RA pressure 8  LEFT HEART CATH 09/04/2019 Conclusions: 1. Moderate, non-obstructive coronary artery disease.  Most severe lesions are 50-60% mid LAD and 50% ostial D1 stenoses, which are not hemodynamically significant by DFR. 2. Widely patent D1 and proximal RCA stents. 3. Normal left ventricular filling pressure.  CARDIAC TELEMETRY MONITORING-INTERPRETATION ONLY 07/31/2017 Poor quality transmission on a few strips Sinus rhythm.  No arrhythmias detected     History of Present Illness:   Alicia [name] is a 76 y.o. female with the above problem list.  She was seen by Alicia Kail, PA-C on 07/05/22 for evaluation of shortness of breath.  F/u Myoview was low risk and an echocardiogram showed normal EF. She returns for f/u. She is here alone. She is doing well without exertional chest pain. She had some discomfort yesterday while watching football  [facility] lost). Otherwise, she has not had chest pain. She has chronic shortness of breath with certain activities. This is unchanged. She sleeps on an incline chronically. She has some upper thigh swelling on the L that is chronic. She has not had syncope. She did have an episode driving to her sister's yesterday. It sounds like she had a temporary loss of concentration. She did not lose her vision or run off the road. She had no focal weakness or loss of speech.     Past Medical History:  Diagnosis Date   Anemia    Arthritis    "right knee" (10/21/2015)   CAD (coronary artery disease)    a. Cath 10/21/15: s/p DES to RCA: moderate diffuse stenosis of mod diagonal, diffuse irregularity LCx and LAD. 2D echo 10/21/15: mod LVH, EF 60-65%, no RWMA, calcified MV, mod-severe LAE. 03/18/17 PCI with DES--> 1 diag   Cataract    CKD (chronic kidney disease), stage IV (HCC)    Diabetes ([place])    Essential hypertension    Family history of adverse reaction to anesthesia    "oldest sister, Alicia [name], related to brain formation at back of head; when they put her to sleep it's hard to wake her up"   GERD (gastroesophageal reflux disease)    History of blood transfusion    "related to ruptured tubal pregnancy"   Hyperlipidemia    Hypertensive heart disease    Morbid obesity ([place])    Stroke (cerebrum) ([place])  Tubular adenoma of colon    Type II diabetes mellitus (HCC)    Current Medications: Current Meds  Medication Sig   amLODipine (NORVASC) 10 MG tablet Take 1 tablet (10 mg total) by mouth at bedtime.   aspirin EC 81 MG tablet Take 1 tablet (81 mg total) by mouth daily. Swallow whole.   atorvastatin (LIPITOR) 80 MG tablet TAKE 1 TABLET BY MOUTH DAILY AT 6 PM.   carvedilol (COREG) 25 MG tablet Take 1 tablet (25 mg total) by mouth 2 (two) times daily with a meal.   Cholecalciferol (VITAMIN D3) 50 MCG (2000 UT) TABS Take 2,000 Units by mouth daily.   clopidogrel (PLAVIX) 75 MG tablet TAKE 1  TABLET BY MOUTH EVERY DAY   doxylamine, Sleep, (UNISOM) 25 MG tablet Take 25 mg by mouth at bedtime.   Evolocumab (REPATHA SURECLICK) 673 MG/ML SOAJ INJECT 1 PEN INTO THE SKIN EVERY 14 (FOURTEEN) DAYS.   ferrous sulfate 325 (65 FE) MG tablet TAKE 1 TABLET BY MOUTH EVERY DAY WITH BREAKFAST   furosemide (LASIX) 40 MG tablet Take 40 mg by mouth daily.   glucose blood (ONETOUCH ULTRA) test strip 1 each by Other route 2 (two) times daily. LAST REFILL SCHEDULE PHYSICAL   isosorbide mononitrate (IMDUR) 60 MG 24 hr tablet Take 1 tablet (60 mg total) by mouth daily.   Magnesium 200 MG TABS Take 200 mg by mouth daily.   nitroGLYCERIN (NITROSTAT) 0.4 MG SL tablet Place 1 tablet (0.4 mg total) under the tongue every 5 (five) minutes as needed for chest pain.   omeprazole (PRILOSEC) 40 MG capsule TAKE 1 CAPSULE BY MOUTH EVERY DAY   ondansetron (ZOFRAN-ODT) 4 MG disintegrating tablet TAKE 1 TABLET BY MOUTH EVERY 8 HOURS AS NEEDED FOR NAUSEA AND VOMITING   spironolactone (ALDACTONE) 25 MG tablet TAKE 1 TABLET BY MOUTH EVERY DAY   TRULICITY 4.19 FX/9.0WI SOPN INJECT 0.75 MG SUBCUTANEOUSLY ONE TIME PER WEEK   vitamin B-12 (CYANOCOBALAMIN) 500 MCG tablet Take 2 tablets (1,000 mcg total) by mouth daily.    Allergies:   Iodinated contrast media and Sulfa antibiotics   Social History   Tobacco Use   Smoking status: Never   Smokeless tobacco: Never  Vaping Use   Vaping Use: Never used  Substance Use Topics   Alcohol use: No   Drug use: No    Family Hx: The patient's family history includes Cancer in her sister; Diabetes in her mother, sister, sister, and sister; Heart attack in her father; Heart attack (age of onset: 17) in her mother; Heart disease in her mother; Hyperlipidemia in her father and mother; Hypertension in her father, mother, and son; Migraines in her daughter and daughter; Stomach cancer in her sister; Stroke (age of onset: 50) in her father. There is no history of Breast cancer, Colon cancer,  Inflammatory bowel disease, Liver disease, Esophageal cancer, Pancreatic cancer, or Rectal cancer.  Review of Systems  Gastrointestinal:  Negative for hematochezia and melena.  Genitourinary:  Negative for hematuria.     EKGs/Labs/Other Test Reviewed:    EKG:  EKG is   ordered today.  The ekg ordered today demonstrates NSR, HR 64, normal axis, non-specific ST-TW changes, QTc 437 ms, no changes   Recent Labs: 04/04/2022: TSH 0.99 04/18/2022: Magnesium 2.1 04/20/2022: Pro B Natriuretic peptide (BNP) 111.0 06/14/2022: ALT 6; BUN 27; Creatinine 2.27; Platelet Count 230; Potassium 4.4; Sodium 140 08/08/2022: Hemoglobin 11.6   Recent Lipid Panel Recent Labs    11/06/21 1525  CHOL  169  TRIG 91.0  HDL 47.50  VLDL 18.2  LDLCALC 104*     Risk Assessment/Calculations/Metrics:              Physical Exam:    VS:  BP 110/60   Pulse 74   Ht '5\' 5"'$  (1.651 m)   Wt 277 lb 12.8 oz (126 kg)   SpO2 98%   BMI 46.23 kg/m     Wt Readings from Last 3 Encounters:  08/13/22 277 lb 12.8 oz (126 kg)  07/30/22 279 lb 8 oz (126.8 kg)  07/24/22 281 lb (127.5 kg)    Constitutional:      Appearance: Healthy appearance. Not in distress.  Neck:     Vascular: JVD normal.  Pulmonary:     Effort: Pulmonary effort is normal.     Breath sounds: No wheezing. No rales.  Cardiovascular:     Normal rate. Regular rhythm. Normal S1. Normal S2.      Murmurs: There is no murmur.  Edema:    Peripheral edema present.    Pretibial: bilateral trace edema of the pretibial area. Abdominal:     Palpations: Abdomen is soft.  Skin:    General: Skin is warm and dry.  Neurological:     Mental Status: Alert and oriented to person, place and time.         ASSESSMENT & PLAN:   Coronary artery disease involving native coronary artery of native heart without angina pectoris Hx of DES to RCA in 2016, DES to D1 in 2018. Cath in 2020 with patent stents and mod non-obs disease in LAD. Recent Myoview was low risk. She  is not having any significant chest pain. EKG is unchanged. Given her chronic kidney disease, it would be best to avoid cardiac catheterization if at all possible. As she is stable, it is acceptable to continue med Rx with her current regimen which includes Amlodipine, ASA, Lipitor, Coreg, Plvix, Repatha, Imdur. F/u in 6 mos.   Chronic heart failure with preserved ejection fraction (HCC) EF 60-65 by recent echocardiogram. NYHA IIb. Volume status stable. Continue Lasix 40 mg once daily, Spironolactone 25 mg once daily.   Essential hypertension BP controlled. Continue Norvasc 10 mg once daily, Coreg 25 mg twice daily, Imdur 60 mg once daily, Spironolactone 25 mg once daily.  Hyperlipidemia Continue Lipitor 80 mg once daily, Repatha 140 mg every 2 wks.  CKD (chronic kidney disease) stage 4, GFR 15-29 ml/min (HCC) Continue f/u with Nephrology ([name]).   Altered consciousness She had an episode yesterday that sounds like she broke her concentration for a period of time. She did not have any focal symptoms. She has not had any issues since. Recent echocardiogram with normal EF. I have asked her to f/u with primary care if she experiences this again.            Dispo:  Return in about 6 months (around 02/11/2023) for Routine Follow Up, w/ Dr. Harrington Challenger.   Medication Adjustments/Labs and Tests Ordered: Current medicines are reviewed at length with the patient today.  Concerns regarding medicines are outlined above.  Tests Ordered: Orders Placed This Encounter  Procedures   EKG 12-Lead   Medication Changes: No orders of the defined types were placed in this encounter.  Signed, Richardson Dopp, PA-C  08/13/2022 11:17 AM    [facility] [address] [place], [place], [place]  [address] Phone: [phone]; Fax: [phone]

## 2022-08-13 ENCOUNTER — Encounter: Payer: Self-pay | Admitting: Physician Assistant

## 2022-08-13 ENCOUNTER — Encounter: Payer: Medicare PPO | Admitting: Physician Assistant

## 2022-08-13 VITALS — BP 110/60 | HR 74 | Ht 65.0 in | Wt 277.8 lb

## 2022-08-13 DIAGNOSIS — I2510 Atherosclerotic heart disease of native coronary artery without angina pectoris: Secondary | ICD-10-CM | POA: Diagnosis not present

## 2022-08-13 DIAGNOSIS — R404 Transient alteration of awareness: Secondary | ICD-10-CM

## 2022-08-13 DIAGNOSIS — I10 Essential (primary) hypertension: Secondary | ICD-10-CM | POA: Diagnosis not present

## 2022-08-13 DIAGNOSIS — E7800 Pure hypercholesterolemia, unspecified: Secondary | ICD-10-CM

## 2022-08-13 DIAGNOSIS — N184 Chronic kidney disease, stage 4 (severe): Secondary | ICD-10-CM | POA: Diagnosis not present

## 2022-08-13 DIAGNOSIS — I5032 Chronic diastolic (congestive) heart failure: Secondary | ICD-10-CM

## 2022-08-13 NOTE — Assessment & Plan Note (Signed)
BP controlled. Continue Norvasc 10 mg once daily, Coreg 25 mg twice daily, Imdur 60 mg once daily, Spironolactone 25 mg once daily.

## 2022-08-13 NOTE — Assessment & Plan Note (Signed)
Hx of DES to RCA in 2016, DES to D1 in 2018. Cath in 2020 with patent stents and mod non-obs disease in LAD. Recent Myoview was low risk. She is not having any significant chest pain. EKG is unchanged. Given her chronic kidney disease, it would be best to avoid cardiac catheterization if at all possible. As she is stable, it is acceptable to continue med Rx with her current regimen which includes Amlodipine, ASA, Lipitor, Coreg, Plvix, Repatha, Imdur. F/u in 6 mos.

## 2022-08-13 NOTE — Assessment & Plan Note (Signed)
Continue Lipitor 80 mg once daily, Repatha 140 mg every 2 wks.

## 2022-08-13 NOTE — Assessment & Plan Note (Signed)
She had an episode yesterday that sounds like she broke her concentration for a period of time. She did not have any focal symptoms. She has not had any issues since. Recent echocardiogram with normal EF. I have asked her to f/u with primary care if she experiences this again.

## 2022-08-13 NOTE — Assessment & Plan Note (Signed)
EF 60-65 by recent echocardiogram. NYHA IIb. Volume status stable. Continue Lasix 40 mg once daily, Spironolactone 25 mg once daily.

## 2022-08-13 NOTE — Patient Instructions (Signed)
Medication Instructions:  Your physician recommends that you continue on your current medications as directed. Please refer to the Current Medication list given to you today.  *If you need a refill on your cardiac medications before your next appointment, please call your pharmacy*   Lab Work: None ordered  If you have labs (blood work) drawn today and your tests are completely normal, you will receive your results only by: MyChart Message (if you have MyChart) OR A paper copy in the mail If you have any lab test that is abnormal or we need to change your treatment, we will call you to review the results.   Testing/Procedures: None ordered   Follow-Up: At [facility], you and your health needs are our priority.  As part of our continuing mission to provide you with exceptional heart care, we have created designated Provider Care Teams.  These Care Teams include your primary Cardiologist (physician) and Advanced Practice Providers (APPs -  Physician Assistants and Nurse Practitioners) who all work together to provide you with the care you need, when you need it.  We recommend signing up for the patient portal called "MyChart".  Sign up information is provided on this After Visit Summary.  MyChart is used to connect with patients for Virtual Visits (Telemedicine).  Patients are able to view lab/test results, encounter notes, upcoming appointments, etc.  Non-urgent messages can be sent to your provider as well.   To learn more about what you can do with MyChart, go to [email or web address].    Your next appointment:   6 month(s)  The format for your next appointment:   In Person  Provider:   Paula Ross, MD     Other Instructions   Important Information About Sugar       

## 2022-08-13 NOTE — Assessment & Plan Note (Signed)
Continue f/u with Nephrology ([name]).

## 2022-08-16 ENCOUNTER — Other Ambulatory Visit: Payer: Self-pay | Admitting: Nurse Practitioner

## 2022-08-16 DIAGNOSIS — E119 Type 2 diabetes mellitus without complications: Secondary | ICD-10-CM

## 2022-08-20 DIAGNOSIS — I2510 Atherosclerotic heart disease of native coronary artery without angina pectoris: Secondary | ICD-10-CM | POA: Diagnosis not present

## 2022-08-20 DIAGNOSIS — N179 Acute kidney failure, unspecified: Secondary | ICD-10-CM | POA: Diagnosis not present

## 2022-08-20 DIAGNOSIS — D631 Anemia in chronic kidney disease: Secondary | ICD-10-CM | POA: Diagnosis not present

## 2022-08-20 DIAGNOSIS — N184 Chronic kidney disease, stage 4 (severe): Secondary | ICD-10-CM | POA: Diagnosis not present

## 2022-08-20 DIAGNOSIS — E1122 Type 2 diabetes mellitus with diabetic chronic kidney disease: Secondary | ICD-10-CM | POA: Diagnosis not present

## 2022-08-20 DIAGNOSIS — E785 Hyperlipidemia, unspecified: Secondary | ICD-10-CM | POA: Diagnosis not present

## 2022-08-20 DIAGNOSIS — I129 Hypertensive chronic kidney disease with stage 1 through stage 4 chronic kidney disease, or unspecified chronic kidney disease: Secondary | ICD-10-CM | POA: Diagnosis not present

## 2022-08-22 ENCOUNTER — Encounter (HOSPITAL_COMMUNITY)
Admission: RE | Admit: 2022-08-22 | Discharge: 2022-08-22 | Disposition: A | Discharge status: Home / Self Care | Payer: Medicare PPO | Source: Ambulatory Visit | Attending: Nephrology | Admitting: Nephrology

## 2022-08-22 VITALS — BP 128/72 | HR 71 | Temp 97.8°F | Resp 16

## 2022-08-22 DIAGNOSIS — D509 Iron deficiency anemia, unspecified: Secondary | ICD-10-CM | POA: Diagnosis not present

## 2022-08-22 DIAGNOSIS — D508 Other iron deficiency anemias: Secondary | ICD-10-CM | POA: Diagnosis not present

## 2022-08-22 LAB — RENAL FUNCTION PANEL
Albumin: 3.8 g/dL (ref 3.5–5.0)
Anion gap: 8 (ref 5–15)
BUN: 36 mg/dL — ABNORMAL HIGH (ref 8–23)
CO2: 26 mmol/L (ref 22–32)
Calcium: 9.3 mg/dL (ref 8.9–10.3)
Chloride: 104 mmol/L (ref 98–111)
Creatinine, Ser: 2.48 mg/dL — ABNORMAL HIGH (ref 0.44–1.00)
GFR, Estimated: 20 mL/min — ABNORMAL LOW (ref >60)
Glucose, Bld: 130 mg/dL — ABNORMAL HIGH (ref 70–99)
Phosphorus: 4.1 mg/dL (ref 2.5–4.6)
Potassium: 4.5 mmol/L (ref 3.5–5.1)
Sodium: 138 mmol/L (ref 135–145)

## 2022-08-22 LAB — IRON AND TIBC
Iron: 52 ug/dL (ref 28–170)
Saturation Ratios: 18 % (ref 10.4–31.8)
TIBC: 294 ug/dL (ref 250–450)
UIBC: 242 ug/dL

## 2022-08-22 LAB — FERRITIN: Ferritin: 127 ng/mL (ref 11–307)

## 2022-08-22 LAB — POCT HEMOGLOBIN-HEMACUE: Hemoglobin: 12.1 g/dL (ref 12.0–15.0)

## 2022-08-22 MED ORDER — EPOETIN ALFA 20000 UNIT/ML IJ SOLN: 20000.0000 [IU] | INTRAMUSCULAR | Status: DC

## 2022-08-22 MED ORDER — EPOETIN ALFA 20000 UNIT/ML IJ SOLN
INTRAMUSCULAR | Status: AC
  Filled 2022-08-22: qty 1

## 2022-09-05 ENCOUNTER — Encounter (HOSPITAL_COMMUNITY)
Admission: RE | Admit: 2022-09-05 | Discharge: 2022-09-05 | Disposition: A | Discharge status: Home / Self Care | Payer: Medicare PPO | Source: Ambulatory Visit | Attending: Nephrology | Admitting: Nephrology

## 2022-09-05 VITALS — BP 117/58 | HR 72 | Temp 98.0°F | Resp 16

## 2022-09-05 DIAGNOSIS — D509 Iron deficiency anemia, unspecified: Secondary | ICD-10-CM | POA: Diagnosis not present

## 2022-09-05 DIAGNOSIS — D508 Other iron deficiency anemias: Secondary | ICD-10-CM | POA: Diagnosis not present

## 2022-09-05 LAB — POCT HEMOGLOBIN-HEMACUE: Hemoglobin: 12.6 g/dL (ref 12.0–15.0)

## 2022-09-05 MED ORDER — EPOETIN ALFA 20000 UNIT/ML IJ SOLN: 20000.0000 [IU] | INTRAMUSCULAR | Status: DC

## 2022-09-14 ENCOUNTER — Inpatient Hospital Stay: Payer: Medicare PPO | Admitting: Hematology and Oncology

## 2022-09-14 ENCOUNTER — Other Ambulatory Visit: Payer: Self-pay | Admitting: Hematology and Oncology

## 2022-09-14 ENCOUNTER — Inpatient Hospital Stay: Payer: Medicare PPO | Attending: Hematology and Oncology

## 2022-09-14 DIAGNOSIS — D509 Iron deficiency anemia, unspecified: Secondary | ICD-10-CM

## 2022-09-19 ENCOUNTER — Encounter (HOSPITAL_COMMUNITY)
Admission: RE | Admit: 2022-09-19 | Discharge: 2022-09-19 | Disposition: A | Discharge status: Home / Self Care | Payer: Medicare PPO | Source: Ambulatory Visit | Attending: Nephrology | Admitting: Nephrology

## 2022-09-19 VITALS — BP 135/74 | HR 70 | Temp 98.1°F | Resp 18

## 2022-09-19 DIAGNOSIS — D509 Iron deficiency anemia, unspecified: Secondary | ICD-10-CM | POA: Diagnosis not present

## 2022-09-19 DIAGNOSIS — D508 Other iron deficiency anemias: Secondary | ICD-10-CM | POA: Diagnosis not present

## 2022-09-19 LAB — FERRITIN: Ferritin: 197 ng/mL (ref 11–307)

## 2022-09-19 LAB — RENAL FUNCTION PANEL
Albumin: 3.6 g/dL (ref 3.5–5.0)
Anion gap: 9 (ref 5–15)
BUN: 34 mg/dL — ABNORMAL HIGH (ref 8–23)
CO2: 27 mmol/L (ref 22–32)
Calcium: 9.1 mg/dL (ref 8.9–10.3)
Chloride: 103 mmol/L (ref 98–111)
Creatinine, Ser: 2.45 mg/dL — ABNORMAL HIGH (ref 0.44–1.00)
GFR, Estimated: 20 mL/min — ABNORMAL LOW (ref >60)
Glucose, Bld: 135 mg/dL — ABNORMAL HIGH (ref 70–99)
Phosphorus: 4.3 mg/dL (ref 2.5–4.6)
Potassium: 4.6 mmol/L (ref 3.5–5.1)
Sodium: 139 mmol/L (ref 135–145)

## 2022-09-19 LAB — IRON AND TIBC
Iron: 76 ug/dL (ref 28–170)
Saturation Ratios: 27 % (ref 10.4–31.8)
TIBC: 287 ug/dL (ref 250–450)
UIBC: 211 ug/dL

## 2022-09-19 LAB — POCT HEMOGLOBIN-HEMACUE: Hemoglobin: 12.1 g/dL (ref 12.0–15.0)

## 2022-09-19 MED ORDER — EPOETIN ALFA 20000 UNIT/ML IJ SOLN: 20000.0000 [IU] | INTRAMUSCULAR | Status: DC

## 2022-09-19 MED ORDER — EPOETIN ALFA-EPBX 10000 UNIT/ML IJ SOLN: 20000.0000 [IU] | INTRAMUSCULAR | Status: DC

## 2022-09-20 LAB — PTH, INTACT AND CALCIUM
Calcium, Total (PTH): 9.1 mg/dL (ref 8.7–10.3)
PTH: 77 pg/mL — ABNORMAL HIGH (ref 15–65)

## 2022-10-03 ENCOUNTER — Encounter (HOSPITAL_COMMUNITY): Payer: Medicare PPO

## 2022-10-03 ENCOUNTER — Encounter (HOSPITAL_COMMUNITY): Payer: Self-pay

## 2022-10-05 ENCOUNTER — Ambulatory Visit: Payer: Medicare PPO | Admitting: Nurse Practitioner

## 2022-10-05 VITALS — BP 108/76 | HR 43 | Temp 98.2°F | Resp 14 | Ht 65.0 in | Wt 277.0 lb

## 2022-10-05 DIAGNOSIS — D508 Other iron deficiency anemias: Secondary | ICD-10-CM

## 2022-10-05 DIAGNOSIS — K219 Gastro-esophageal reflux disease without esophagitis: Secondary | ICD-10-CM

## 2022-10-05 DIAGNOSIS — N184 Chronic kidney disease, stage 4 (severe): Secondary | ICD-10-CM | POA: Diagnosis not present

## 2022-10-05 DIAGNOSIS — I10 Essential (primary) hypertension: Secondary | ICD-10-CM

## 2022-10-05 LAB — CBC
HCT: 35.2 % — ABNORMAL LOW (ref 36.0–46.0)
Hemoglobin: 11 g/dL — ABNORMAL LOW (ref 12.0–15.0)
MCHC: 31.3 g/dL (ref 30.0–36.0)
MCV: 68.8 fl — ABNORMAL LOW (ref 78.0–100.0)
Platelets: 170 10*3/uL (ref 150.0–400.0)
RBC: 5.11 Mil/uL (ref 3.87–5.11)
RDW: 16.9 % — ABNORMAL HIGH (ref 11.5–15.5)
WBC: 3.2 10*3/uL — ABNORMAL LOW (ref 4.0–10.5)

## 2022-10-05 LAB — BASIC METABOLIC PANEL
BUN: 36 mg/dL — ABNORMAL HIGH (ref 6–23)
CO2: 30 mEq/L (ref 19–32)
Calcium: 8.7 mg/dL (ref 8.4–10.5)
Chloride: 99 mEq/L (ref 96–112)
Creatinine, Ser: 2.47 mg/dL — ABNORMAL HIGH (ref 0.40–1.20)
GFR: 18.68 mL/min — ABNORMAL LOW (ref >60.00)
Glucose, Bld: 142 mg/dL — ABNORMAL HIGH (ref 70–99)
Potassium: 4.8 mEq/L (ref 3.5–5.1)
Sodium: 135 mEq/L (ref 135–145)

## 2022-10-05 MED ORDER — AMLODIPINE BESYLATE 10 MG PO TABS: 10.0000 mg | ORAL_TABLET | Freq: Every day | ORAL | 1 refills | Status: DC

## 2022-10-05 MED ORDER — OMEPRAZOLE 40 MG PO CPDR: 40.0000 mg | DELAYED_RELEASE_CAPSULE | Freq: Every day | ORAL | 1 refills | Status: DC

## 2022-10-05 NOTE — Progress Notes (Signed)
Established Patient Office Visit  Subjective   Patient ID: Alicia [name], female    DOB: [birth year]  Age: 76 y.o. MRN: [id number]  Chief Complaint  Patient presents with   discuss iron deficiency treatments    Patient states her iron treatment injections have been d/c per hematology office and patient was not aware who cancelled or why. She has went the last 6 weeks every 2 weeks for labs and Hemoglobin was above 12 so they would not give her injection and the last time on 10/03/22 she was told treatments were d/c. She has not contacted her kidney specialist about this wanted to speak with [place]. Per scan order on 10/03/22 Dr Cameron Sprang doctor d/c this treatment.     HPI   Iron deficiency anemia: Patient is followed by nephrology and hematology.  Patient's hemoglobin had dropped and was getting iron infusions through hematology every 2 weeks for the past 6 weeks.  The orders were discontinued and patient was not made aware why.  Upon further investigation patient's nephrologist had signed the order to discontinue the infusion due to patient's hemoglobin normalizing.  Patient and patient's family was concerned about patient's numbers like labs drawn today.   Review of Systems  Constitutional:  Negative for chills and fever.  Respiratory:  Negative for shortness of breath.   Cardiovascular:  Negative for chest pain.  Neurological:  Negative for dizziness.      Objective:     BP 108/76   Pulse (!) 43   Temp 98.2 F (36.8 C)   Resp 14   Ht '5\' 5"'$  (1.651 m)   Wt 277 lb (125.6 kg)   SpO2 100%   BMI 46.10 kg/m    Physical Exam Vitals and nursing note reviewed.  Constitutional:      Appearance: Normal appearance. She is obese.  Cardiovascular:     Rate and Rhythm: Normal rate and regular rhythm.     Heart sounds: Normal heart sounds.  Pulmonary:     Effort: Pulmonary effort is normal.     Breath sounds: Normal breath sounds.  Neurological:     Mental Status: She is  alert.      No results found for any visits on 10/05/22.    The ASCVD Risk score (Arnett DK, et al., 2019) failed to calculate for the following reasons:   The patient has a prior MI or stroke diagnosis    Assessment & Plan:   Problem List Items Addressed This Visit       Cardiovascular and Mediastinum   Essential hypertension (Chronic)    Patient's blood pressure under good control.  Patient needing refill on amlodipine 10 mg.  Refill provided      Relevant Medications   amLODipine (NORVASC) 10 MG tablet   Other Relevant Orders   CBC   Basic metabolic panel     Digestive   GERD (gastroesophageal reflux disease) - Primary    Patient needing refill on omeprazole 40 mg once daily.  Patient has had endoscopy and colonoscopy in 2021.      Relevant Medications   omeprazole (PRILOSEC) 40 MG capsule     Genitourinary   CKD (chronic kidney disease) stage 4, GFR 15-29 ml/min (HCC)    Patient managed by [facility].  Renal function reviewed in chart with stable pending labs today      Relevant Orders   CBC   Basic metabolic panel     Other   Iron deficiency anemia  Was going through hematology getting infusions.  Patient would like her hemoglobin rechecked today pending labs.      Relevant Orders   CBC   Basic metabolic panel    Return for as scheduled.    Romilda Garret, NP

## 2022-10-05 NOTE — Patient Instructions (Signed)
Nice to see you today I will be in touch with the lab results once I have them Keep your appointment with me on Nov 29, 2022 for you physical and labs, follow up sooner if you need me

## 2022-10-05 NOTE — Assessment & Plan Note (Signed)
Patient's blood pressure under good control.  Patient needing refill on amlodipine 10 mg.  Refill provided

## 2022-10-05 NOTE — Assessment & Plan Note (Signed)
Patient managed by [facility].  Renal function reviewed in chart with stable pending labs today

## 2022-10-05 NOTE — Assessment & Plan Note (Signed)
Patient needing refill on omeprazole 40 mg once daily.  Patient has had endoscopy and colonoscopy in 2021.

## 2022-10-05 NOTE — Assessment & Plan Note (Signed)
Was going through hematology getting infusions.  Patient would like her hemoglobin rechecked today pending labs.

## 2022-10-08 ENCOUNTER — Ambulatory Visit: Payer: Medicare PPO | Admitting: Nurse Practitioner

## 2022-10-08 ENCOUNTER — Other Ambulatory Visit: Payer: Self-pay | Admitting: Nurse Practitioner

## 2022-10-08 DIAGNOSIS — R7989 Other specified abnormal findings of blood chemistry: Secondary | ICD-10-CM

## 2022-10-17 ENCOUNTER — Encounter (HOSPITAL_COMMUNITY): Payer: Medicare PPO

## 2022-10-22 ENCOUNTER — Other Ambulatory Visit (INDEPENDENT_AMBULATORY_CARE_PROVIDER_SITE_OTHER): Payer: Medicare PPO

## 2022-10-22 DIAGNOSIS — R7989 Other specified abnormal findings of blood chemistry: Secondary | ICD-10-CM | POA: Diagnosis not present

## 2022-10-22 LAB — CBC
HCT: 33.1 % — ABNORMAL LOW (ref 36.0–46.0)
Hemoglobin: 10.5 g/dL — ABNORMAL LOW (ref 12.0–15.0)
MCHC: 31.7 g/dL (ref 30.0–36.0)
MCV: 68.2 fl — ABNORMAL LOW (ref 78.0–100.0)
Platelets: 231 10*3/uL (ref 150.0–400.0)
RBC: 4.86 Mil/uL (ref 3.87–5.11)
RDW: 17.9 % — ABNORMAL HIGH (ref 11.5–15.5)
WBC: 4.3 10*3/uL (ref 4.0–10.5)

## 2022-10-25 ENCOUNTER — Other Ambulatory Visit: Payer: Self-pay

## 2022-10-25 ENCOUNTER — Encounter: Payer: Self-pay | Admitting: Internal Medicine

## 2022-10-25 ENCOUNTER — Telehealth: Payer: Self-pay | Admitting: Nurse Practitioner

## 2022-10-25 ENCOUNTER — Encounter: Payer: Self-pay | Admitting: Nurse Practitioner

## 2022-10-25 DIAGNOSIS — I2510 Atherosclerotic heart disease of native coronary artery without angina pectoris: Secondary | ICD-10-CM

## 2022-10-25 DIAGNOSIS — E119 Type 2 diabetes mellitus without complications: Secondary | ICD-10-CM

## 2022-10-25 DIAGNOSIS — E7800 Pure hypercholesterolemia, unspecified: Secondary | ICD-10-CM

## 2022-10-25 DIAGNOSIS — I10 Essential (primary) hypertension: Secondary | ICD-10-CM

## 2022-10-25 MED ORDER — TRULICITY 0.75 MG/0.5ML ~~LOC~~ SOAJ: SUBCUTANEOUS | 1 refills | Status: DC

## 2022-10-25 MED ORDER — ATORVASTATIN CALCIUM 80 MG PO TABS: ORAL_TABLET | ORAL | 3 refills | Status: DC

## 2022-10-25 MED ORDER — REPATHA SURECLICK 140 MG/ML ~~LOC~~ SOAJ: 140.0000 mg | SUBCUTANEOUS | 12 refills | Status: DC

## 2022-10-25 MED ORDER — AMLODIPINE BESYLATE 10 MG PO TABS: 10.0000 mg | ORAL_TABLET | Freq: Every day | ORAL | 1 refills | Status: DC

## 2022-10-25 NOTE — Telephone Encounter (Signed)
Pt called stating she has been trying to refill her iron meds for two weeks now & somehow they're getting denied each time. Pt stated her pharmacy suggested she contact her pcp regarding the refill. Pt stated she was told from mychart that she wouldn't be able to refill the meds. Pt states she understands all her meds weren't prescribed by [facility]. Pt is asking what pills should/shouldn't be taking? Pt is requesting a call back from a nurse to discuss further details @ [phone]

## 2022-10-26 NOTE — Telephone Encounter (Signed)
Patient sent MyChart message to [facility]. See Matts response for further documentation.

## 2022-10-31 DIAGNOSIS — N184 Chronic kidney disease, stage 4 (severe): Secondary | ICD-10-CM | POA: Diagnosis not present

## 2022-11-07 ENCOUNTER — Ambulatory Visit (INDEPENDENT_AMBULATORY_CARE_PROVIDER_SITE_OTHER): Payer: Medicare PPO

## 2022-11-07 VITALS — Ht 65.0 in | Wt 277.0 lb

## 2022-11-07 DIAGNOSIS — Z0000 Encounter for general adult medical examination without abnormal findings: Secondary | ICD-10-CM

## 2022-11-07 NOTE — Progress Notes (Signed)
Virtual Visit via Telephone Note  I connected with  Alicia [name] on 11/07/22 at  2:00 PM EST by telephone and verified that I am speaking with the correct person using two identifiers.  Location: Patient: home Provider: Chilo Persons participating in the virtual visit: [place]   I discussed the limitations, risks, security and privacy concerns of performing an evaluation and management service by telephone and the availability of in person appointments. The patient expressed understanding and agreed to proceed.  Interactive audio and video telecommunications were attempted between this nurse and patient, however failed, due to patient having technical difficulties OR patient did not have access to video capability.  We continued and completed visit with audio only.  Some vital signs may be absent or patient reported.   Dionisio David, LPN  Subjective:   Alicia [name] is a 76 y.o. female who presents for Medicare Annual (Subsequent) preventive examination.  Review of Systems     Cardiac Risk Factors include: advanced age (>40mn, >>36women);diabetes mellitus     Objective:    There were no vitals filed for this visit. There is no height or weight on file to calculate BMI.     11/07/2022    2:01 PM 04/18/2022    3:26 PM 04/17/2022   10:57 AM 02/02/2022   10:05 AM 11/08/2021    3:09 PM 06/13/2021   12:37 PM 05/26/2021    3:05 PM  Advanced Directives  Does Patient Have a Medical Advance Directive? _0  No No  Would patient like information on creating a medical advance directive? No - Patient declined  No - Patient declined   No - Patient declined No - Patient declined    Current Medications (verified) Outpatient Encounter Medications as of 11/07/2022  Medication Sig   amLODipine (NORVASC) 10 MG tablet Take 1 tablet (10 mg total) by mouth at bedtime.   aspirin EC 81 MG tablet Take 1 tablet (81 mg total) by mouth daily. Swallow whole.    atorvastatin (LIPITOR) 80 MG tablet TAKE 1 TABLET BY MOUTH DAILY AT 6 PM.   carvedilol (COREG) 25 MG tablet Take 1 tablet (25 mg total) by mouth 2 (two) times daily with a meal.   Cholecalciferol (VITAMIN D3) 50 MCG (2000 UT) TABS Take 2,000 Units by mouth daily.   clopidogrel (PLAVIX) 75 MG tablet TAKE 1 TABLET BY MOUTH EVERY DAY   doxylamine, Sleep, (UNISOM) 25 MG tablet Take 25 mg by mouth at bedtime.   Dulaglutide (TRULICITY) 03.22MGU/5.4YHSOPN INJECT 0.75 MG SUBCUTANEOUSLY ONE TIME PER WEEK   Evolocumab (REPATHA SURECLICK) 1062MG/ML SOAJ Inject 140 mg into the skin every 14 (fourteen) days.   ferrous sulfate 325 (65 FE) MG tablet TAKE 1 TABLET BY MOUTH EVERY DAY WITH BREAKFAST   furosemide (LASIX) 40 MG tablet Take 40 mg by mouth daily.   glucose blood (ONETOUCH ULTRA) test strip 1 each by Other route 2 (two) times daily. LAST REFILL SCHEDULE PHYSICAL   isosorbide mononitrate (IMDUR) 60 MG 24 hr tablet Take 1 tablet (60 mg total) by mouth daily.   Magnesium 200 MG TABS Take 200 mg by mouth daily.   nitroGLYCERIN (NITROSTAT) 0.4 MG SL tablet Place 1 tablet (0.4 mg total) under the tongue every 5 (five) minutes as needed for chest pain.   omeprazole (PRILOSEC) 40 MG capsule Take 1 capsule (40 mg total) by mouth daily.   ondansetron (ZOFRAN-ODT) 4 MG disintegrating tablet TAKE 1  TABLET BY MOUTH EVERY 8 HOURS AS NEEDED FOR NAUSEA AND VOMITING   spironolactone (ALDACTONE) 25 MG tablet TAKE 1 TABLET BY MOUTH EVERY DAY   vitamin B-12 (CYANOCOBALAMIN) 500 MCG tablet Take 2 tablets (1,000 mcg total) by mouth daily.   No facility-administered encounter medications on file as of 11/07/2022.    Allergies (verified) Iodinated contrast media and Sulfa antibiotics   History: Past Medical History:  Diagnosis Date   Anemia    Arthritis    "right knee" (10/21/2015)   CAD (coronary artery disease)    a. Cath 10/21/15: s/p DES to RCA: moderate diffuse stenosis of mod diagonal, diffuse irregularity  LCx and LAD. 2D echo 10/21/15: mod LVH, EF 60-65%, no RWMA, calcified MV, mod-severe LAE. 03/18/17 PCI with DES--> 1 diag   Cataract    CKD (chronic kidney disease), stage IV (HCC)    Diabetes ([place])    Essential hypertension    Family history of adverse reaction to anesthesia    "oldest sister, [name], related to brain formation at back of head; when they put her to sleep it's hard to wake her up"   GERD (gastroesophageal reflux disease)    History of blood transfusion    "related to ruptured tubal pregnancy"   Hyperlipidemia    Hypertensive heart disease    Morbid obesity ([place])    Stroke (cerebrum) ([place])    Tubular adenoma of colon    Type II diabetes mellitus ([place])    Past Surgical History:  Procedure Laterality Date   ABDOMINAL HYSTERECTOMY  1988   fibroid   [place]   BIOPSY  07/18/2020   Procedure: BIOPSY;  Surgeon: [name], MD;  Location: [facility];  Service: Gastroenterology;;   CARDIAC CATHETERIZATION N/A 10/21/2015   Procedure: Right/Left Heart Cath and Coronary Angiography;  Surgeon: [name], MD;  Location: [place] [facility];  Service: Cardiovascular;  Laterality: N/A;   CHOLECYSTECTOMY OPEN  1984   COLONOSCOPY WITH PROPOFOL N/A 07/18/2020   Procedure: COLONOSCOPY WITH PROPOFOL;  Surgeon: [name], MD;  Location: [facility];  Service: Gastroenterology;  Laterality: N/A;   CORONARY STENT INTERVENTION N/A 03/18/2017   Procedure: Coronary Stent Intervention;  Surgeon: [name], MD;  Location: [place] [facility];  Service: Cardiovascular;  Laterality: N/A;   CORONARY STENT PLACEMENT  03/18/2017   A OPTIMIZE STUDY Drug Eluting Stent (2.5 mm 18 mm - post-dilated to 2.7 mm) was successfully placed   ECTOPIC PREGNANCY SURGERY     ENDOSCOPIC MUCOSAL RESECTION N/A 01/23/2021   Procedure: ENDOSCOPIC MUCOSAL RESECTION;  Surgeon: Irving Copas., MD;  Location: [facility];  Service: Gastroenterology;  Laterality:  N/A;   ESOPHAGOGASTRODUODENOSCOPY (EGD) WITH PROPOFOL N/A 07/18/2020   Procedure: ESOPHAGOGASTRODUODENOSCOPY (EGD) WITH PROPOFOL;  Surgeon: [name], MD;  [facility]: [facility];  Service: Gastroenterology;  Laterality: N/A;   ESOPHAGOGASTRODUODENOSCOPY (EGD) WITH PROPOFOL N/A 01/23/2021   Procedure: ESOPHAGOGASTRODUODENOSCOPY (EGD) WITH PROPOFOL;  Surgeon: Rush Landmark Telford Nab., MD;  Location: [facility];  Service: Gastroenterology;  Laterality: N/A;   EUS N/A 01/23/2021   Procedure: UPPER ENDOSCOPIC ULTRASOUND (EUS) RADIAL;  Surgeon: Irving Copas., MD;  Location: [facility];  Service: Gastroenterology;  Laterality: N/A;   HEMOSTASIS CLIP PLACEMENT  01/23/2021   Procedure: HEMOSTASIS CLIP PLACEMENT;  Surgeon: Irving Copas., MD;  Location: [facility];  Service: Gastroenterology;;   INTRAVASCULAR PRESSURE WIRE/FFR STUDY N/A 09/04/2019   Procedure: INTRAVASCULAR PRESSURE WIRE/FFR STUDY;  Surgeon: Nelva Bush,  MD;  Location: [place] [facility];  Service: Cardiovascular;  Laterality: N/A;   LEFT HEART CATH AND CORONARY ANGIOGRAPHY N/A 03/18/2017   Procedure: Left Heart Cath and Coronary Angiography;  Surgeon: [name], MD;  Location: [place] [facility];  Service: Cardiovascular;  Laterality: N/A;   LEFT HEART CATH AND CORONARY ANGIOGRAPHY N/A 09/04/2019   Procedure: LEFT HEART CATH AND CORONARY ANGIOGRAPHY;  Surgeon: Nelva Bush, MD;  Location: [place] [facility];  Service: Cardiovascular;  Laterality: N/A;   POLYPECTOMY  07/18/2020   Procedure: POLYPECTOMY;  Surgeon: [name], MD;  Location: [facility];  Service: Gastroenterology;;   REDUCTION MAMMAPLASTY Bilateral ~ [place] INJECTION  01/23/2021   Procedure: SUBMUCOSAL LIFTING INJECTION;  Surgeon: Irving Copas., MD;  Location: [facility];  Service: Gastroenterology;;   TONSILLECTOMY     Family History  Problem Relation Age of Onset   Diabetes Mother    Heart attack  Mother 76       2007   Hypertension Mother    Hyperlipidemia Mother    Heart disease Mother    Heart attack Father    Stroke Father 19   Hypertension Father    Hyperlipidemia Father    Cancer Sister        Gastric cancer in 2013   Stomach cancer Sister        in the cavity behind the stomach   Diabetes Sister    Diabetes Sister    Diabetes Sister    Migraines Daughter        brain surgery   Migraines Daughter    Hypertension Son        weight loss corrected that   Breast cancer Neg Hx    Colon cancer Neg Hx    Inflammatory bowel disease Neg Hx    Liver disease Neg Hx    Esophageal cancer Neg Hx    Pancreatic cancer Neg Hx    Rectal cancer Neg Hx    Social History   Socioeconomic History   Marital status: Divorced    Spouse name: Not on file   Number of children: 3   Years of education: doctorate   Highest education level: Not on file  Occupational History   Occupation: Teacher/retired  Tobacco Use   Smoking status: Never   Smokeless tobacco: Never  Vaping Use   Vaping Use: Never used  Substance and Sexual Activity   Alcohol use: No   Drug use: No   Sexual activity: Not Currently  Other Topics Concern   Not on file  Social History Narrative   Caffeine use-yes   Regular exercise-no   Social Determinants of Health   Financial Resource Strain: Low Risk  (11/07/2022)   Overall Financial Resource Strain (CARDIA)    Difficulty of Paying Living Expenses: Not hard at all  Food Insecurity: No Food Insecurity (11/07/2022)   Hunger Vital Sign    Worried About Running Out of Food in the Last Year: Never true    Ran Out of Food in the Last Year: Never true  Transportation Needs: No Transportation Needs (11/07/2022)   PRAPARE - Hydrologist (Medical): No    Lack of Transportation (Non-Medical): No  Physical Activity: Insufficiently Active (11/07/2022)   Exercise Vital Sign    Days of Exercise per Week: 7 days    Minutes of Exercise  per Session: 20 min  Stress: No Stress Concern Present (11/07/2022)   [employer]  of Occupational Health - Occupational Stress Questionnaire    Feeling of Stress : Not at all  Social Connections: Moderately Isolated (11/07/2022)   Social Connection and Isolation Panel [NHANES]    Frequency of Communication with Friends and Family: More than three times a week    Frequency of Social Gatherings with Friends and Family: More than three times a week    Attends Religious Services: More than 4 times per year    Active Member of [employer] or Organizations: No    Attends Music therapist: Never    Marital Status: Divorced    Tobacco Counseling Counseling given: Not Answered   Clinical Intake:  Pre-visit preparation completed: Yes  Pain : No/denies pain     Nutritional Risks: None Diabetes: Yes CBG done?: No Did pt. bring in CBG monitor from home?: No  How often do you need to have someone help you when you read instructions, pamphlets, or other written materials from your doctor or pharmacy?: 1 - Never  Diabetic?yes Nutrition Risk Assessment:  Has the patient had any N/V/D within the last 2 months?  No  Does the patient have any non-healing wounds?  No  Has the patient had any unintentional weight loss or weight gain?  No   Diabetes:  Is the patient diabetic?  Yes  If diabetic, was a CBG obtained today?  No  Did the patient bring in their glucometer from home?  No  How often do you monitor your CBG's? occasionally.   Financial Strains and Diabetes Management:  Are you having any financial strains with the device, your supplies or your medication? No .  Does the patient want to be seen by Chronic Care Management for management of their diabetes?  No  Would the patient like to be referred to a Nutritionist or for Diabetic Management?  No   Diabetic Exams:  Diabetic Eye Exam: Completed 09/26/21. Overdue for diabetic eye exam. Pt has been advised about the  importance in completing this exam.  Diabetic Foot Exam: Completed 03/29/21. Pt has been advised about the importance in completing this exam.  Interpreter Needed?: No  Information entered by :: Kirke Shaggy, LPN   Activities of Daily Living    11/07/2022    2:02 PM 04/18/2022    3:25 PM  In your present state of health, do you have any difficulty performing the following activities:  Hearing? 0   Vision? 0   Difficulty concentrating or making decisions? 0   Walking or climbing stairs? 1   Dressing or bathing? 0   Doing errands, shopping? 0 0  Preparing Food and eating ? N   Using the Toilet? N   In the past six months, have you accidently leaked urine? N   Do you have problems with loss of bowel control? N   Managing your Medications? N   Managing your Finances? N   Housekeeping or managing your Housekeeping? N     Patient Care Team: Michela Pitcher, NP as PCP - General (Pain Medicine) [name], MD as PCP - Cardiology (Cardiology) [name], MD as Consulting Physician (Cardiology)  Indicate any recent Medical Services you may have received from other than [facility] providers in the past year (date may be approximate).     Assessment:   This is a routine wellness examination for Sandhya.  Hearing/Vision screen Hearing Screening - Comments:: Wears aids Vision Screening - Comments:: Wears glasses- MD in [place] next to Dr.Shapiro's ofc  Dietary issues  and exercise activities discussed: Current Exercise Habits: Home exercise routine, Type of exercise: walking, Time (Minutes): 20, Frequency (Times/Week): 7, Weekly Exercise (Minutes/Week): 140, Intensity: Mild   Goals Addressed             This Visit's Progress    DIET - EAT MORE FRUITS AND VEGETABLES         Depression Screen    11/07/2022    1:59 PM 11/06/2021    2:17 PM 07/04/2021   12:04 PM 03/28/2020    3:42 PM 01/29/2019   11:14 AM 12/20/2017    2:28 PM 11/04/2017    3:52 PM  PHQ 2/9 Scores  PHQ - 2  Score 0 0 0 0 1 0 1  PHQ- 9 Score 0 1 5 0       Fall Risk    11/07/2022    2:02 PM 11/06/2021    2:19 PM 07/04/2021   11:21 AM 03/28/2020    3:42 PM 12/20/2017    2:28 PM  Fall Risk   Falls in the past year? 0 1 0 1 Yes  Number falls in past yr: 0 0 0 1 2 or more  Injury with Fall? 0 1  0 No  Risk for fall due to : No Fall Risks Impaired balance/gait  Impaired balance/gait   Follow up Falls prevention discussed;Falls evaluation completed   Falls evaluation completed;Education provided     FALL RISK PREVENTION PERTAINING TO THE HOME:  Any stairs in or around the home? Yes  If so, are there any without handrails? No  Home free of loose throw rugs in walkways, pet beds, electrical cords, etc? Yes  Adequate lighting in your home to reduce risk of falls? Yes   ASSISTIVE DEVICES UTILIZED TO PREVENT FALLS:  Life alert? No  Use of a cane, walker or w/c? Yes  Grab bars in the bathroom? Yes  Shower chair or bench in shower? Yes  Elevated toilet seat or a handicapped toilet? No    Cognitive Function:        11/07/2022    2:04 PM  6CIT Screen  What Year? 0 points  What month? 0 points  What time? 0 points  Count back from 20 0 points  Months in reverse 0 points  Repeat phrase 0 points  Total Score 0 points    Immunizations Immunization History  Administered Date(s) Administered   Covid-19, Mrna,Vaccine(Spikevax)28yr and older 10/02/2022   Fluad Quad(high Dose 65+) 09/05/2021, 07/30/2022   Influenza Split 10/09/2012   Influenza,inj,Quad PF,6+ Mos 10/10/2015, 08/13/2016   PFIZER(Purple Top)SARS-COV-2 Vaccination 01/05/2020, 01/26/2020, 09/20/2020   Pneumococcal Conjugate-13 10/10/2015   Pneumococcal Polysaccharide-23 10/09/2012   Tdap 10/09/2012   Zoster Recombinat (Shingrix) 10/02/2022    TDAP status: Due, Education has been provided regarding the importance of this vaccine. Advised may receive this vaccine at local pharmacy or Health Dept. Aware to provide a copy of  the vaccination record if obtained from local pharmacy or Health Dept. Verbalized acceptance and understanding.  Flu Vaccine status: Up to date  Pneumococcal vaccine status: Up to date  Covid-19 vaccine status: Completed vaccines  Qualifies for Shingles Vaccine? Yes   Zostavax completed No   Shingrix Completed?: No.    Education has been provided regarding the importance of this vaccine. Patient has been advised to call insurance company to determine out of pocket expense if they have not yet received this vaccine. Advised may also receive vaccine at local pharmacy or Health Dept. Verbalized acceptance and understanding.  Screening Tests Health Maintenance  Topic Date Due   FOOT EXAM  03/29/2022   OPHTHALMOLOGY EXAM  09/26/2022   DTaP/Tdap/Td (2 - Td or Tdap) 10/09/2022   Zoster Vaccines- Shingrix (2 of 2) 11/27/2022   HEMOGLOBIN A1C  01/28/2023   Diabetic kidney evaluation - Urine ACR  04/05/2023   Diabetic kidney evaluation - eGFR measurement  10/06/2023   Medicare Annual Wellness (AWV)  11/08/2023   COLONOSCOPY (Pts 45-82yr Insurance coverage will need to be confirmed)  07/18/2030   Pneumonia Vaccine 75 Years old  Completed   INFLUENZA VACCINE  Completed   DEXA SCAN  Completed   COVID-19 Vaccine  Completed   Hepatitis C Screening  Completed   HPV VACCINES  Aged Out    Health Maintenance  Health Maintenance Due  Topic Date Due   FOOT EXAM  03/29/2022   OPHTHALMOLOGY EXAM  09/26/2022   DTaP/Tdap/Td (2 - Td or Tdap) 10/09/2022    Colorectal cancer screening: No longer required.   Mammogram status: No longer required due to age.  Bone Density status: Completed 02/12/22. Results reflect: Bone density results: NORMAL. Repeat every 5 years.  Lung Cancer Screening: (Low Dose CT Chest recommended if Age 75-80years, 30 pack-year currently smoking OR have quit w/in 15years.) does not qualify.   Additional Screening:  Hepatitis C Screening: does qualify; Completed  12/20/17  Vision Screening: Recommended annual ophthalmology exams for early detection of glaucoma and other disorders of the eye. Is the patient up to date with their annual eye exam?  Yes  Who is the provider or what is the name of the office in which the patient attends annual [place] exams[place] not established with a provider, would they like to be referred to a provider to establish care? No .   Dental Screening: Recommended annual dental exams for proper oral hygiene  Community Resource Referral / Chronic Care Management: CRR required this visit?  No   CCM required this visit?  No      Plan:     I have personally reviewed and noted the following in the patient's chart:   Medical and social history Use of alcohol, tobacco or illicit drugs  Current medications and supplements including opioid prescriptions. Patient is not currently taking opioid prescriptions. Functional ability and status Nutritional status Physical activity Advanced directives List of other physicians Hospitalizations, surgeries, and ER visits in previous 12 months Vitals Screenings to include cognitive, depression, and falls Referrals and appointments  In addition, I have reviewed and discussed with patient certain preventive protocols, quality metrics, and best practice recommendations. A written personalized care plan for preventive services as well as general preventive health recommendations were provided to patient.     LDionisio David LPN   [id number]  Nurse Notes: needs RX for iron, doesn't want OTC iron

## 2022-11-07 NOTE — Patient Instructions (Signed)
Alicia [name] , Thank you for taking time to come for your Medicare Wellness Visit. I appreciate your ongoing commitment to your health goals. Please review the following plan we discussed and let me know if I can assist you in the future.   Screening recommendations/referrals: Colonoscopy: 07/18/20 Mammogram: aged out Bone Density: 02/12/22 Recommended yearly ophthalmology/optometry visit for glaucoma screening and checkup Recommended yearly dental visit for hygiene and checkup  Vaccinations: Influenza vaccine: 07/30/22 Pneumococcal vaccine: 10/10/15 Tdap vaccine: 10/09/12 Shingles vaccine: Shingrix 10/02/22   Covid-19:01/05/20, 01/26/20, 09/20/20  Advanced directives: no  Conditions/risks identified: none  Next appointment: Follow up in one year for your annual wellness visit 11/11/23 @ 9:15 am by phone   Preventive Care 65 Years and Older, Female Preventive care refers to lifestyle choices and visits with your health care provider that can promote health and wellness. What does preventive care include? A yearly physical exam. This is also called an annual well check. Dental exams once or twice a year. Routine eye exams. Ask your health care provider how often you should have your eyes checked. Personal lifestyle choices, including: Daily care of your teeth and gums. Regular physical activity. Eating a healthy diet. Avoiding tobacco and drug use. Limiting alcohol use. Practicing safe sex. Taking low-dose aspirin every day. Taking vitamin and mineral supplements as recommended by your health care provider. What happens during an annual well check? The services and screenings done by your health care provider during your annual well check will depend on your age, overall health, lifestyle risk factors, and family history of disease. Counseling  Your health care provider may ask you questions about your: Alcohol use. Tobacco use. Drug use. Emotional well-being. Home and relationship  well-being. Sexual activity. Eating habits. History of falls. Memory and ability to understand (cognition). Work and work Statistician. Reproductive health. Screening  You may have the following tests or measurements: Height, weight, and BMI. Blood pressure. Lipid and cholesterol levels. These may be checked every 5 years, or more frequently if you are over 40 years old. Skin check. Lung cancer screening. You may have this screening every year starting at age 56 if you have a 30-pack-year history of smoking and currently smoke or have quit within the past 15 years. Fecal occult blood test (FOBT) of the stool. You may have this test every year starting at age 27. Flexible sigmoidoscopy or colonoscopy. You may have a sigmoidoscopy every 5 years or a colonoscopy every 10 years starting at age 81. Hepatitis C blood test. Hepatitis B blood test. Sexually transmitted disease (STD) testing. Diabetes screening. This is done by checking your blood sugar (glucose) after you have not eaten for a while (fasting). You may have this done every 1-3 years. Bone density scan. This is done to screen for osteoporosis. You may have this done starting at age 88. Mammogram. This may be done every 1-2 years. Talk to your health care provider about how often you should have regular mammograms. Talk with your health care provider about your test results, treatment options, and if necessary, the need for more tests. Vaccines  Your health care provider may recommend certain vaccines, such as: Influenza vaccine. This is recommended every year. Tetanus, diphtheria, and acellular pertussis (Tdap, Td) vaccine. You may need a Td booster every 10 years. Zoster vaccine. You may need this after age 39. Pneumococcal 13-valent conjugate (PCV13) vaccine. One dose is recommended after age 75. Pneumococcal polysaccharide (PPSV23) vaccine. One dose is recommended after age 33. Talk to your  health care provider about which  screenings and vaccines you need and how often you need them. This information is not intended to replace advice given to you by your health care provider. Make sure you discuss any questions you have with your health care provider. Document Released: 12/02/2015 Document Revised: 07/25/2016 Document Reviewed: 09/06/2015 Elsevier Interactive Patient Education  2017 [employer] Prevention in the Home Falls can cause injuries. They can happen to people of all ages. There are many things you can do to make your home safe and to help prevent falls. What can I do on the outside of my home? Regularly fix the edges of walkways and driveways and fix any cracks. Remove anything that might make you trip as you walk through a door, such as a raised step or threshold. Trim any bushes or trees on the path to your home. Use bright outdoor lighting. Clear any walking paths of anything that might make someone trip, such as rocks or tools. Regularly check to see if handrails are loose or broken. Make sure that both sides of any steps have handrails. Any raised decks and porches should have guardrails on the edges. Have any leaves, snow, or ice cleared regularly. Use sand or salt on walking paths during winter. Clean up any spills in your garage right away. This includes oil or grease spills. What can I do in the bathroom? Use night lights. Install grab bars by the toilet and in the tub and shower. Do not use towel bars as grab bars. Use non-skid mats or decals in the tub or shower. If you need to sit down in the shower, use a plastic, non-slip stool. Keep the floor dry. Clean up any water that spills on the floor as soon as it happens. Remove soap buildup in the tub or shower regularly. Attach bath mats securely with double-sided non-slip rug tape. Do not have throw rugs and other things on the floor that can make you trip. What can I do in the bedroom? Use night lights. Make sure that you have a  light by your bed that is easy to reach. Do not use any sheets or blankets that are too big for your bed. They should not hang down onto the floor. Have a firm chair that has side arms. You can use this for support while you get dressed. Do not have throw rugs and other things on the floor that can make you trip. What can I do in the kitchen? Clean up any spills right away. Avoid walking on wet floors. Keep items that you use a lot in easy-to-reach places. If you need to reach something above you, use a strong step stool that has a grab bar. Keep electrical cords out of the way. Do not use floor polish or wax that makes floors slippery. If you must use wax, use non-skid floor wax. Do not have throw rugs and other things on the floor that can make you trip. What can I do with my stairs? Do not leave any items on the stairs. Make sure that there are handrails on both sides of the stairs and use them. Fix handrails that are broken or loose. Make sure that handrails are as long as the stairways. Check any carpeting to make sure that it is firmly attached to the stairs. Fix any carpet that is loose or worn. Avoid having throw rugs at the top or bottom of the stairs. If you do have throw rugs, attach them  to the floor with carpet tape. Make sure that you have a light switch at the top of the stairs and the bottom of the stairs. If you do not have them, ask someone to add them for you. What else can I do to help prevent falls? Wear shoes that: Do not have high heels. Have rubber bottoms. Are comfortable and fit you well. Are closed at the toe. Do not wear sandals. If you use a stepladder: Make sure that it is fully opened. Do not climb a closed stepladder. Make sure that both sides of the stepladder are locked into place. Ask someone to hold it for you, if possible. Clearly mark and make sure that you can see: Any grab bars or handrails. First and last steps. Where the edge of each step  is. Use tools that help you move around (mobility aids) if they are needed. These include: Canes. Walkers. Scooters. Crutches. Turn on the lights when you go into a dark area. Replace any light bulbs as soon as they burn out. Set up your furniture so you have a clear path. Avoid moving your furniture around. If any of your floors are uneven, fix them. If there are any pets around you, be aware of where they are. Review your medicines with your doctor. Some medicines can make you feel dizzy. This can increase your chance of falling. Ask your doctor what other things that you can do to help prevent falls. This information is not intended to replace advice given to you by your health care provider. Make sure you discuss any questions you have with your health care provider. Document Released: 09/01/2009 Document Revised: 04/12/2016 Document Reviewed: 12/10/2014 Elsevier Interactive Patient Education  2017 [employer].

## 2022-11-28 ENCOUNTER — Encounter (HOSPITAL_COMMUNITY): Payer: Self-pay

## 2022-11-28 ENCOUNTER — Encounter: Payer: Self-pay | Admitting: Physician Assistant

## 2022-11-28 ENCOUNTER — Other Ambulatory Visit (HOSPITAL_COMMUNITY): Payer: Self-pay

## 2022-11-28 DIAGNOSIS — D631 Anemia in chronic kidney disease: Secondary | ICD-10-CM | POA: Diagnosis not present

## 2022-11-28 DIAGNOSIS — I2510 Atherosclerotic heart disease of native coronary artery without angina pectoris: Secondary | ICD-10-CM | POA: Diagnosis not present

## 2022-11-28 DIAGNOSIS — I129 Hypertensive chronic kidney disease with stage 1 through stage 4 chronic kidney disease, or unspecified chronic kidney disease: Secondary | ICD-10-CM | POA: Diagnosis not present

## 2022-11-28 DIAGNOSIS — N179 Acute kidney failure, unspecified: Secondary | ICD-10-CM | POA: Diagnosis not present

## 2022-11-28 DIAGNOSIS — N184 Chronic kidney disease, stage 4 (severe): Secondary | ICD-10-CM | POA: Diagnosis not present

## 2022-11-28 DIAGNOSIS — E785 Hyperlipidemia, unspecified: Secondary | ICD-10-CM | POA: Diagnosis not present

## 2022-11-28 DIAGNOSIS — E1122 Type 2 diabetes mellitus with diabetic chronic kidney disease: Secondary | ICD-10-CM | POA: Diagnosis not present

## 2022-11-28 LAB — IRON,TIBC AND FERRITIN PANEL
%SAT: 41
Ferritin: 501
Iron: 86
TIBC: 211
UIBC: 125

## 2022-11-28 LAB — CBC AND DIFFERENTIAL: Hemoglobin: 9.8 — AB (ref 12.0–16.0)

## 2022-11-28 LAB — COMPREHENSIVE METABOLIC PANEL
Albumin: 4 (ref 3.5–5.0)
Calcium: 9 (ref 8.7–10.7)
eGFR: 21

## 2022-11-28 LAB — BASIC METABOLIC PANEL
BUN: 38 — AB (ref 4–21)
CO2: 27 — AB (ref 13–22)
Chloride: 103 (ref 99–108)
Creatinine: 2.4 — AB (ref 0.5–1.1)
Glucose: 265
Potassium: 5.4 mEq/L — AB (ref 3.5–5.1)
Sodium: 138 (ref 137–147)

## 2022-11-29 ENCOUNTER — Ambulatory Visit (INDEPENDENT_AMBULATORY_CARE_PROVIDER_SITE_OTHER): Payer: Medicare PPO | Admitting: Nurse Practitioner

## 2022-11-29 ENCOUNTER — Encounter: Payer: Self-pay | Admitting: Nurse Practitioner

## 2022-11-29 VITALS — BP 152/92 | HR 67 | Ht 65.0 in | Wt 280.0 lb

## 2022-11-29 DIAGNOSIS — Z8673 Personal history of transient ischemic attack (TIA), and cerebral infarction without residual deficits: Secondary | ICD-10-CM | POA: Diagnosis not present

## 2022-11-29 DIAGNOSIS — Z0000 Encounter for general adult medical examination without abnormal findings: Secondary | ICD-10-CM

## 2022-11-29 DIAGNOSIS — E119 Type 2 diabetes mellitus without complications: Secondary | ICD-10-CM | POA: Diagnosis not present

## 2022-11-29 DIAGNOSIS — N184 Chronic kidney disease, stage 4 (severe): Secondary | ICD-10-CM

## 2022-11-29 DIAGNOSIS — I10 Essential (primary) hypertension: Secondary | ICD-10-CM

## 2022-11-29 DIAGNOSIS — D509 Iron deficiency anemia, unspecified: Secondary | ICD-10-CM | POA: Diagnosis not present

## 2022-11-29 DIAGNOSIS — K219 Gastro-esophageal reflux disease without esophagitis: Secondary | ICD-10-CM

## 2022-11-29 MED ORDER — PANTOPRAZOLE SODIUM 40 MG PO TBEC: 40.0000 mg | DELAYED_RELEASE_TABLET | Freq: Every day | ORAL | 1 refills | Status: DC

## 2022-11-29 NOTE — Assessment & Plan Note (Signed)
Discussed age-appropriate immunizations and screening exams.  Information given to update tetanus shot and second shingles vaccine at her local pharmacy.  Patient was given information at discharge about preventative healthcare maintenance with anticipatory guidance for age range.  Patient is up-to-date on mammogram, aged out of Pap smears, aged out of colonoscopies.

## 2022-11-29 NOTE — Assessment & Plan Note (Signed)
Blood pressure slightly above goal today but she did not take her blood pressure medicine.  Patient is currently maintained on amlodipine, carvedilol, isosorbide mononitrate.  Continue taking medication as prescribed is followed by cardiology

## 2022-11-29 NOTE — Patient Instructions (Addendum)
Nice to see you today Get a tetanus shot and the second shingles vaccine at your pharmacy after the 15th of this month  We are STOPPING the omeprazole and will start protonix for you stomach. I have sent in a new prescription I want to see you in 4 months, sooner if you need me

## 2022-11-29 NOTE — Assessment & Plan Note (Signed)
Followed by hematology/oncology on oral iron and was doing infusions which have been stopped.

## 2022-11-29 NOTE — Assessment & Plan Note (Signed)
Patient currently on Trulicity 2.62 mg.  Tolerating medication well.  Pending A1c.

## 2022-11-29 NOTE — Assessment & Plan Note (Signed)
Patient currently on a statin and clopidogrel.  Continue

## 2022-11-29 NOTE — Assessment & Plan Note (Signed)
Continue working on healthy lifestyle modifications patient is using Nutrisystem along with a GLP-1 receptor agonist.

## 2022-11-29 NOTE — Progress Notes (Signed)
Established Patient Office Visit  Subjective   Patient ID: Alicia [name], female    DOB: [birth year]  Age: 76 y.o. MRN: [id number]  Chief Complaint  Patient presents with   Annual Exam    HPI  DM2: Last A1c is 6.7%.  Patient currently maintained on Trulicity 3.33 mg once weekly. States that her meter broke    GERD: Patient currently maintained on omeprazole 40 mg. Patient is on plavix  HTN: Patient currently maintained on amlodipine, carvedilol, isosorbide mononitrate and spironolactone.  She is followed by Alicia Carnes, MD cardiologist. Has not had medication today   CKD: Patient is followed by Dr. Sheilah [name] kidney. States that she was seen yesterday at clinic  IDA: She is followed by the cancer center for infusions and iron deficiency. Dr. Narda [name]   for complete physical and follow up of chronic conditions.  Immunizations: -Tetanus: Completed in 2013 -Influenza: Completed this season 07/30/2022 -Shingles: Completed Shingrix series -Pneumonia: Completed in UTD  -HPV: aged out  Diet: [place]. States that she is doing the nutrisystem. For healthy. States that she gets 3 meals aday and a snack. She is doing 17 oz x3 of water, coffee, and hot tea Exercise: No regular exercise. Very little. 15 mins a dya worth of walking   Eye exam: Completes annually. Needs updating in 2022 Dental exam: Needs updatnf    Pap Smear: Completed in aged out Mammogram: Completed in 02/12/2022, repeat in 1 year  Colonoscopy: Completed in 07/18/2020, no recall Lung Cancer Screening: NA Dexa:   Sleep: States that she is going to bed around 130 and gets up at 8am. States that she will sleep 4 hours and then go to the bath room. States that she does snore       Review of Systems  Constitutional:  Negative for chills and fever.  Respiratory:  Positive for shortness of breath.   Cardiovascular:  Negative for chest pain and leg swelling.  Gastrointestinal:  Negative for  abdominal pain, blood in stool, constipation, diarrhea, nausea and vomiting.       BM daily   Genitourinary:  Negative for dysuria and hematuria.  Neurological:  Negative for tingling and headaches.  Psychiatric/Behavioral:  Negative for hallucinations and suicidal ideas.       Objective:     BP (!) 152/92   Pulse 67   Ht '5\' 5"'$  (1.651 m)   Wt 280 lb (127 kg)   SpO2 99%   BMI 46.59 kg/m  BP Readings from Last 3 Encounters:  11/29/22 (!) 152/92  10/05/22 108/76  09/19/22 135/74   Wt Readings from Last 3 Encounters:  11/29/22 280 lb (127 kg)  11/07/22 277 lb (125.6 kg)  10/05/22 277 lb (125.6 kg)      Physical Exam Vitals and nursing note reviewed.  Constitutional:      Appearance: Normal appearance.  HENT:     Right Ear: Tympanic membrane, ear canal and external ear normal.     Left Ear: Tympanic membrane, ear canal and external ear normal.     Mouth/Throat:     Mouth: Mucous membranes are moist.     Pharynx: Oropharynx is clear.  Eyes:     Extraocular Movements: Extraocular movements intact.     Pupils: Pupils are equal, round, and reactive to light.  Cardiovascular:     Rate and Rhythm: Normal rate and regular rhythm.     Pulses: Normal pulses.     Heart sounds: Normal heart sounds.  Pulmonary:  Effort: Pulmonary effort is normal.     Breath sounds: Normal breath sounds.  Abdominal:     General: Bowel sounds are normal. There is no distension.     Palpations: There is no mass.     Tenderness: There is no abdominal tenderness.     Hernia: No hernia is present.  Musculoskeletal:     Right lower leg: No edema.     Left lower leg: No edema.  Lymphadenopathy:     Cervical: No cervical adenopathy.  Skin:    General: Skin is warm.  Neurological:     General: No focal deficit present.     Mental Status: She is alert.     Comments: Bilateral upper and lower extremity strength 5/5  Psychiatric:        Mood and Affect: Mood normal.        Behavior: Behavior  normal.        Thought Content: Thought content normal.        Judgment: Judgment normal.      No results found for any visits on 11/29/22.    The ASCVD Risk score (Arnett DK, et al., 2019) failed to calculate for the following reasons:   The patient has a prior MI or stroke diagnosis    Assessment & Plan:   Problem List Items Addressed This Visit       Cardiovascular and Mediastinum   Essential hypertension (Chronic)    Blood pressure slightly above goal today but she did not take her blood pressure medicine.  Patient is currently maintained on amlodipine, carvedilol, isosorbide mononitrate.  Continue taking medication as prescribed is followed by cardiology      Relevant Orders   Lipid panel     Digestive   GERD (gastroesophageal reflux disease)    Has been followed by GI in the past.  Patient is on omeprazole and Plavix which interacts with switch omeprazole to Protonix.  He prescription sent in today      Relevant Medications   pantoprazole (PROTONIX) 40 MG tablet     Endocrine   Type 2 diabetes mellitus without complication, without long-term current use of insulin ([place])    Patient currently on Trulicity 5.36 mg.  Tolerating medication well.  Pending A1c.      Relevant Orders   Lipid panel   Hemoglobin A1c     Genitourinary   CKD (chronic kidney disease) stage 4, GFR 15-29 ml/min [facility])    Patient is followed by Dr. Moshe [name] with [facility].  States she just saw her yesterday        Other   Obesity, Class III, BMI 40-49.9 (morbid obesity) ([place])    Continue working on healthy lifestyle modifications patient is using Nutrisystem along with a GLP-1 receptor agonist.      Iron deficiency anemia    Followed by hematology/oncology on oral iron and was doing infusions which have been stopped.      Preventative health care - Primary    Discussed age-appropriate immunizations and screening exams.  Information given to update tetanus shot and second  shingles vaccine at her local pharmacy.  Patient was given information at discharge about preventative healthcare maintenance with anticipatory guidance for age range.  Patient is up-to-date on mammogram, aged out of Pap smears, aged out of colonoscopies.      History of CVA (cerebrovascular accident)    Patient currently on a statin and clopidogrel.  Continue      Relevant Orders   Lipid panel  Return for DM recheck.    Romilda Garret, NP

## 2022-11-29 NOTE — Assessment & Plan Note (Signed)
Patient is followed by [name] with [facility].  States she just saw her yesterday

## 2022-11-29 NOTE — Assessment & Plan Note (Signed)
Has been followed by GI in the past.  Patient is on omeprazole and Plavix which interacts with switch omeprazole to Protonix.  He prescription sent in today

## 2022-11-30 ENCOUNTER — Telehealth: Payer: Self-pay

## 2022-11-30 ENCOUNTER — Telehealth: Payer: Self-pay | Admitting: Hematology and Oncology

## 2022-11-30 ENCOUNTER — Telehealth: Payer: Self-pay | Admitting: Nurse Practitioner

## 2022-11-30 DIAGNOSIS — D509 Iron deficiency anemia, unspecified: Secondary | ICD-10-CM

## 2022-11-30 DIAGNOSIS — D508 Other iron deficiency anemias: Secondary | ICD-10-CM

## 2022-11-30 LAB — LIPID PANEL
Cholesterol: 108 mg/dL (ref 0–200)
HDL: 45.4 mg/dL (ref >39.00)
LDL Cholesterol: 46 mg/dL (ref 0–99)
NonHDL: 62.16
Total CHOL/HDL Ratio: 2
Triglycerides: 80 mg/dL (ref 0.0–149.0)
VLDL: 16 mg/dL (ref 0.0–40.0)

## 2022-11-30 LAB — HEMOGLOBIN A1C: Hgb A1c MFr Bld: 8.7 % — ABNORMAL HIGH (ref 4.6–6.5)

## 2022-11-30 NOTE — Telephone Encounter (Signed)
Sched per 1/12 IB, pt has been called and confirmed

## 2022-11-30 NOTE — Telephone Encounter (Signed)
Erroneous entry

## 2022-11-30 NOTE — Telephone Encounter (Signed)
Spoke with patient and she states she can no longer see Dr. Libby Maw name in her [facility] provider listing. She is asking for a new hematology provider. Advised patient she should call [facility] and ask if they have changed her care over to a new provider if Dr. Lorenso Courier is no longer there, as this would be their responsibility and we would not have any information regarding this. Patient states she will call the [facility] and inquire. Advised patient they would likely update her MyChart once she has been seen by the new provider.  Nothing further needed at this time.

## 2022-11-30 NOTE — Telephone Encounter (Signed)
Patient called requesting appointment. "My kidney doctor told me it's time to restart my cancer treatments." She states that her doctor feels like her kidneys are stable. She reports HGB has been dropping.   Scheduling message sent for labs and provider visit.

## 2022-11-30 NOTE — Telephone Encounter (Signed)
Patient would like to know who is her updated oncologist? She would also know if Catalina Antigua has received the hemoglobin results from Dr. Clover Mealy?

## 2022-12-10 ENCOUNTER — Other Ambulatory Visit: Payer: Self-pay | Admitting: Physician Assistant

## 2022-12-10 DIAGNOSIS — D509 Iron deficiency anemia, unspecified: Secondary | ICD-10-CM

## 2022-12-10 DIAGNOSIS — D508 Other iron deficiency anemias: Secondary | ICD-10-CM

## 2022-12-11 ENCOUNTER — Inpatient Hospital Stay (HOSPITAL_BASED_OUTPATIENT_CLINIC_OR_DEPARTMENT_OTHER): Payer: Medicare PPO | Admitting: Physician Assistant

## 2022-12-11 ENCOUNTER — Encounter: Payer: Self-pay | Admitting: Nurse Practitioner

## 2022-12-11 ENCOUNTER — Inpatient Hospital Stay: Payer: Medicare PPO | Attending: Physician Assistant

## 2022-12-11 ENCOUNTER — Other Ambulatory Visit: Payer: Self-pay

## 2022-12-11 VITALS — BP 135/68 | HR 83 | Temp 97.9°F | Resp 17 | Ht 65.0 in | Wt 280.4 lb

## 2022-12-11 DIAGNOSIS — D631 Anemia in chronic kidney disease: Secondary | ICD-10-CM | POA: Insufficient documentation

## 2022-12-11 DIAGNOSIS — Z800 Family history of malignant neoplasm of digestive organs: Secondary | ICD-10-CM | POA: Diagnosis not present

## 2022-12-11 DIAGNOSIS — D509 Iron deficiency anemia, unspecified: Secondary | ICD-10-CM | POA: Insufficient documentation

## 2022-12-11 DIAGNOSIS — I129 Hypertensive chronic kidney disease with stage 1 through stage 4 chronic kidney disease, or unspecified chronic kidney disease: Secondary | ICD-10-CM | POA: Insufficient documentation

## 2022-12-11 DIAGNOSIS — E1122 Type 2 diabetes mellitus with diabetic chronic kidney disease: Secondary | ICD-10-CM | POA: Diagnosis not present

## 2022-12-11 DIAGNOSIS — N184 Chronic kidney disease, stage 4 (severe): Secondary | ICD-10-CM | POA: Diagnosis not present

## 2022-12-11 DIAGNOSIS — Z90710 Acquired absence of both cervix and uterus: Secondary | ICD-10-CM | POA: Diagnosis not present

## 2022-12-11 DIAGNOSIS — Z9884 Bariatric surgery status: Secondary | ICD-10-CM | POA: Diagnosis not present

## 2022-12-11 LAB — CBC WITH DIFFERENTIAL (CANCER CENTER ONLY)
Abs Immature Granulocytes: 0 10*3/uL (ref 0.00–0.07)
Basophils Absolute: 0 10*3/uL (ref 0.0–0.1)
Basophils Relative: 0 %
Eosinophils Absolute: 0.2 10*3/uL (ref 0.0–0.5)
Eosinophils Relative: 3 %
HCT: 32.1 % — ABNORMAL LOW (ref 36.0–46.0)
Hemoglobin: 9.8 g/dL — ABNORMAL LOW (ref 12.0–15.0)
Immature Granulocytes: 0 %
Lymphocytes Relative: 32 %
Lymphs Abs: 1.6 10*3/uL (ref 0.7–4.0)
MCH: 22.5 pg — ABNORMAL LOW (ref 26.0–34.0)
MCHC: 30.5 g/dL (ref 30.0–36.0)
MCV: 73.6 fL — ABNORMAL LOW (ref 80.0–100.0)
Monocytes Absolute: 0.3 10*3/uL (ref 0.1–1.0)
Monocytes Relative: 7 %
Neutro Abs: 2.8 10*3/uL (ref 1.7–7.7)
Neutrophils Relative %: 58 %
Platelet Count: 195 10*3/uL (ref 150–400)
RBC: 4.36 MIL/uL (ref 3.87–5.11)
RDW: 18.1 % — ABNORMAL HIGH (ref 11.5–15.5)
WBC Count: 4.9 10*3/uL (ref 4.0–10.5)
nRBC: 0 % (ref 0.0–0.2)

## 2022-12-11 LAB — RETIC PANEL
Immature Retic Fract: 13.6 % (ref 2.3–15.9)
RBC.: 4.29 MIL/uL (ref 3.87–5.11)
Retic Count, Absolute: 87.1 10*3/uL (ref 19.0–186.0)
Retic Ct Pct: 2 % (ref 0.4–3.1)
Reticulocyte Hemoglobin: 26 pg — ABNORMAL LOW (ref >27.9)

## 2022-12-11 LAB — IRON AND IRON BINDING CAPACITY (CC-WL,HP ONLY)
Iron: 87 ug/dL (ref 28–170)
Saturation Ratios: 32 % — ABNORMAL HIGH (ref 10.4–31.8)
TIBC: 270 ug/dL (ref 250–450)
UIBC: 183 ug/dL (ref 148–442)

## 2022-12-11 LAB — CMP (CANCER CENTER ONLY)
ALT: 7 U/L (ref 0–44)
AST: 11 U/L — ABNORMAL LOW (ref 15–41)
Albumin: 3.8 g/dL (ref 3.5–5.0)
Alkaline Phosphatase: 148 U/L — ABNORMAL HIGH (ref 38–126)
Anion gap: 6 (ref 5–15)
BUN: 43 mg/dL — ABNORMAL HIGH (ref 8–23)
CO2: 28 mmol/L (ref 22–32)
Calcium: 8.9 mg/dL (ref 8.9–10.3)
Chloride: 103 mmol/L (ref 98–111)
Creatinine: 2.41 mg/dL — ABNORMAL HIGH (ref 0.44–1.00)
GFR, Estimated: 20 mL/min — ABNORMAL LOW (ref >60)
Glucose, Bld: 209 mg/dL — ABNORMAL HIGH (ref 70–99)
Potassium: 4.5 mmol/L (ref 3.5–5.1)
Sodium: 137 mmol/L (ref 135–145)
Total Bilirubin: 0.7 mg/dL (ref 0.3–1.2)
Total Protein: 6.3 g/dL — ABNORMAL LOW (ref 6.5–8.1)

## 2022-12-11 LAB — VITAMIN B12: Vitamin B-12: 2861 pg/mL — ABNORMAL HIGH (ref 180–914)

## 2022-12-11 LAB — FERRITIN: Ferritin: 280 ng/mL (ref 11–307)

## 2022-12-11 NOTE — Progress Notes (Unsigned)
[facility] [phone]   Fax:([phone]) [place] NOTE  Patient Care Team: Michela Pitcher, NP as PCP - General (Pain Medicine) [name], MD as PCP - Cardiology (Cardiology) [name], MD as Consulting Physician (Cardiology)  Hematological/Oncological History #Microcytic Anemia #Anemia in Setting of CKD  -07/18/2020: Colonoscopy: one 5 mm polyp in descending colon. Diverticulosis in sigmoid colon and ascending colon.   -11/22/2020: WBC 4.5, Hgb 10.1 (L), MCV 69.6, Plt 235  -11/30/2020: Vitamin B12 186 (L), Retic Ct Pct 1.9%, Abs Retic 87,780 (H), Iron 62, Transferrin 219, iron saturation 20.2% (L), Ferritin 70.4, Folate 12.3. Started vitamin B12 2000 mcg per day.   -01/23/2021:EGD/EUS: Previous surgical intervention-query mini-gastric pouch found in the cardia. Submucosal nodule in the duodenum bulb removed via EMR. Pathology was consistent with polypoid dudoenal tissue showing nodular, mature adipose tissue within the submucosa, consistent with a lipoma. Negative for dysplasia or malignancy.  -03/29/2021: WBC 5.6, Hgb 9.2 (L), MCV 68.6 (L), Plt 251, Ferritin 56.6  -04/04/2021: Establish care with Dede Query PA-C  04/28/2021-05/26/2021: Received IV venofer x 5 doses.   06/14/2022: WBC 4.0. Hgb 9.7, MCV 74.6, Plt 230  HISTORY OF PRESENTING ILLNESS:  Alicia [name] 77 y.o. female returns for a follow up for microcytic anemia.  She was last seen on 06/14/2022 by Dr. Lorenso Courier.  In the interim since her last visit she has had no major changes in her health.  On exam today Alicia [name] reports that her energy levels are stable. She is able to complete her ADLs on her own. She does have some shortness of breath with exertion but none at rest. She denies any appetite or weight changes. She does have some occasional dizziness mainly with position changes. She denies easy bruising or signs of bleeding. She is otherwise feeling well without  any new or concerning symptoms. She denies fevers, chills, sweats, chest pain, cough, nausea, vomiting, diarrhea or constipation. She has no other complaints. Rest of the 10 point ROS is below.   MEDICAL HISTORY:  Past Medical History:  Diagnosis Date   Anemia    Arthritis    "right knee" (10/21/2015)   CAD (coronary artery disease)    a. Cath 10/21/15: s/p DES to RCA: moderate diffuse stenosis of mod diagonal, diffuse irregularity LCx and LAD. 2D echo 10/21/15: mod LVH, EF 60-65%, no RWMA, calcified MV, mod-severe LAE. 03/18/17 PCI with DES--> 1 diag   Cataract    CKD (chronic kidney disease), stage IV (HCC)    Diabetes ([employer])    Essential hypertension    Family history of adverse reaction to anesthesia    "oldest sister, [name], related to brain formation at back of head; when they put her to sleep it's hard to wake her up"   GERD (gastroesophageal reflux disease)    History of blood transfusion    "related to ruptured tubal pregnancy"   Hyperlipidemia    Hypertensive heart disease    Morbid obesity ([place])    Stroke (cerebrum) ([place])    Tubular adenoma of colon    Type II diabetes mellitus ([place])     SURGICAL HISTORY: Past Surgical History:  Procedure Laterality Date   ABDOMINAL HYSTERECTOMY  1988   fibroid   [place]   BIOPSY  07/18/2020   Procedure: BIOPSY;  Surgeon: [name], MD;  Location: [facility];  Service: Gastroenterology;;   CARDIAC CATHETERIZATION N/A 10/21/2015  Procedure: Right/Left Heart Cath and Coronary Angiography;  Surgeon: [name], MD;  Location: [facility];  Service: Cardiovascular;  Laterality: N/A;   CHOLECYSTECTOMY OPEN  1984   COLONOSCOPY WITH PROPOFOL N/A 07/18/2020   Procedure: COLONOSCOPY WITH PROPOFOL;  Surgeon: [name], MD;  Location: [facility];  Service: Gastroenterology;  Laterality: N/A;   CORONARY STENT INTERVENTION N/A 03/18/2017   Procedure: Coronary Stent Intervention;  Surgeon:  [name], MD;  Location: [facility];  Service: Cardiovascular;  Laterality: N/A;   CORONARY STENT PLACEMENT  03/18/2017   A OPTIMIZE STUDY Drug Eluting Stent (2.5 mm 18 mm - post-dilated to 2.7 mm) was successfully placed   ECTOPIC PREGNANCY SURGERY     ENDOSCOPIC MUCOSAL RESECTION N/A 01/23/2021   Procedure: ENDOSCOPIC MUCOSAL RESECTION;  Surgeon: Irving Copas., MD;  Location: [facility];  Service: Gastroenterology;  Laterality: N/A;   ESOPHAGOGASTRODUODENOSCOPY (EGD) WITH PROPOFOL N/A 07/18/2020   Procedure: ESOPHAGOGASTRODUODENOSCOPY (EGD) WITH PROPOFOL;  Surgeon: [name], MD;  Location: [facility];  Service: Gastroenterology;  Laterality: N/A;   ESOPHAGOGASTRODUODENOSCOPY (EGD) WITH PROPOFOL N/A 01/23/2021   Procedure: ESOPHAGOGASTRODUODENOSCOPY (EGD) WITH PROPOFOL;  Surgeon: Rush Landmark Telford Nab., MD;  Location: [facility];  Service: Gastroenterology;  Laterality: N/A;   EUS N/A 01/23/2021   Procedure: UPPER ENDOSCOPIC ULTRASOUND (EUS) RADIAL;  Surgeon: Irving Copas., MD;  Location: [facility];  Service: Gastroenterology;  Laterality: N/A;   HEMOSTASIS CLIP PLACEMENT  01/23/2021   Procedure: HEMOSTASIS CLIP PLACEMENT;  Surgeon: Irving Copas., MD;  Location: [facility];  Service: Gastroenterology;;   INTRAVASCULAR PRESSURE WIRE/FFR STUDY N/A 09/04/2019   Procedure: INTRAVASCULAR PRESSURE WIRE/FFR STUDY;  Surgeon: Nelva Bush, MD;  Location: [facility];  Service: Cardiovascular;  Laterality: N/A;   LEFT HEART CATH AND CORONARY ANGIOGRAPHY N/A 03/18/2017   Procedure: Left Heart Cath and Coronary Angiography;  Surgeon: [name], MD;  Location: [facility];  Service: Cardiovascular;  Laterality: N/A;   LEFT HEART CATH AND CORONARY ANGIOGRAPHY N/A 09/04/2019   Procedure: LEFT HEART CATH AND CORONARY ANGIOGRAPHY;  Surgeon: Nelva Bush, MD;  Location: [facility];  Service: Cardiovascular;  Laterality: N/A;    POLYPECTOMY  07/18/2020   Procedure: POLYPECTOMY;  Surgeon: [name], MD;  Location: [facility];  Service: Gastroenterology;;   REDUCTION MAMMAPLASTY Bilateral ~ [place] INJECTION  01/23/2021   Procedure: SUBMUCOSAL LIFTING INJECTION;  Surgeon: Irving Copas., MD;  Location: [facility];  Service: Gastroenterology;;   TONSILLECTOMY      SOCIAL HISTORY: Social History   Socioeconomic History   Marital status: Divorced    Spouse name: Not on file   Number of children: 3   Years of education: doctorate   Highest education level: Not on file  Occupational History   Occupation: Teacher/retired  Tobacco Use   Smoking status: Never   Smokeless tobacco: Never  Vaping Use   Vaping Use: Never used  Substance and Sexual Activity   Alcohol use: No   Drug use: No   Sexual activity: Not Currently  Other Topics Concern   Not on file  Social History Narrative   Caffeine use-yes   Regular exercise-no   Social Determinants of Health   Financial Resource Strain: Low Risk  (11/07/2022)   Overall Financial Resource Strain (CARDIA)    Difficulty of Paying Living Expenses: Not hard at all  Food Insecurity: No Food Insecurity (11/07/2022)   Hunger Vital Sign  Worried About Charity fundraiser in the Last Year: Never true    [place] in the Last Year: Never true  Transportation Needs: No Transportation Needs (11/07/2022)   PRAPARE - Hydrologist (Medical): No    Lack of Transportation (Non-Medical): No  Physical Activity: Insufficiently Active (11/07/2022)   Exercise Vital Sign    Days of Exercise per Week: 7 days    Minutes of Exercise per Session: 20 min  Stress: No Stress Concern Present (11/07/2022)   [place]    Feeling of Stress : Not at all  Social Connections: Moderately Isolated (11/07/2022)   Social Connection and Isolation Panel [NHANES]     Frequency of Communication with Friends and Family: More than three times a week    Frequency of Social Gatherings with Friends and Family: More than three times a week    Attends Religious Services: More than 4 times per year    Active Member of [employer] or Organizations: No    Attends Archivist Meetings: Never    Marital Status: Divorced  Human resources officer Violence: Not At Risk (11/07/2022)   Humiliation, Afraid, Rape, and Kick questionnaire    Fear of Current or Ex-Partner: No    Emotionally Abused: No    Physically Abused: No    Sexually Abused: No    FAMILY HISTORY: Family History  Problem Relation Age of Onset   Diabetes Mother    Heart attack Mother 76       2007   Hypertension Mother    Hyperlipidemia Mother    Heart disease Mother    Heart attack Father    Stroke Father 28   Hypertension Father    Hyperlipidemia Father    Cancer Sister        Gastric cancer in 2013   Stomach cancer Sister        in the cavity behind the stomach   Diabetes Sister    Diabetes Sister    Diabetes Sister    Migraines Daughter        brain surgery   Migraines Daughter    Hypertension Son        weight loss corrected that   Breast cancer Neg Hx    Colon cancer Neg Hx    Inflammatory bowel disease Neg Hx    Liver disease Neg Hx    Esophageal cancer Neg Hx    Pancreatic cancer Neg Hx    Rectal cancer Neg Hx     ALLERGIES:  is allergic to iodinated contrast media and sulfa antibiotics.  MEDICATIONS:  Current Outpatient Medications  Medication Sig Dispense Refill   amLODipine (NORVASC) 10 MG tablet Take 1 tablet (10 mg total) by mouth at bedtime. 90 tablet 1   aspirin EC 81 MG tablet Take 1 tablet (81 mg total) by mouth daily. Swallow whole. 30 tablet 11   atorvastatin (LIPITOR) 80 MG tablet TAKE 1 TABLET BY MOUTH DAILY AT 6 PM. 90 tablet 3   carvedilol (COREG) 25 MG tablet Take 1 tablet (25 mg total) by mouth 2 (two) times daily with a meal. 180 tablet 3    Cholecalciferol (VITAMIN D3) 50 MCG (2000 UT) TABS Take 2,000 Units by mouth daily.     clopidogrel (PLAVIX) 75 MG tablet TAKE 1 TABLET BY MOUTH EVERY DAY 90 tablet 3   doxylamine, Sleep, (UNISOM) 25 MG tablet Take 25 mg by mouth at bedtime.  Dulaglutide (TRULICITY) 8.10 FB/5.1WC SOPN INJECT 0.75 MG SUBCUTANEOUSLY ONE TIME PER WEEK 6 mL 1   Evolocumab (REPATHA SURECLICK) 585 MG/ML SOAJ Inject 140 mg into the skin every 14 (fourteen) days. 2 mL 12   ferrous sulfate 325 (65 FE) MG tablet TAKE 1 TABLET BY MOUTH EVERY DAY WITH BREAKFAST 90 tablet 0   furosemide (LASIX) 40 MG tablet Take 40 mg by mouth daily.     glucose blood (ONETOUCH ULTRA) test strip 1 each by Other route 2 (two) times daily. LAST REFILL SCHEDULE PHYSICAL 200 strip 0   isosorbide mononitrate (IMDUR) 60 MG 24 hr tablet Take 1 tablet (60 mg total) by mouth daily. 90 tablet 3   Magnesium 200 MG TABS Take 200 mg by mouth daily.     nitroGLYCERIN (NITROSTAT) 0.4 MG SL tablet Place 1 tablet (0.4 mg total) under the tongue every 5 (five) minutes as needed for chest pain. 25 tablet 3   ondansetron (ZOFRAN-ODT) 4 MG disintegrating tablet TAKE 1 TABLET BY MOUTH EVERY 8 HOURS AS NEEDED FOR NAUSEA AND VOMITING 30 tablet 0   pantoprazole (PROTONIX) 40 MG tablet Take 1 tablet (40 mg total) by mouth daily. 90 tablet 1   spironolactone (ALDACTONE) 25 MG tablet TAKE 1 TABLET BY MOUTH EVERY DAY 90 tablet 1   vitamin B-12 (CYANOCOBALAMIN) 500 MCG tablet Take 2 tablets (1,000 mcg total) by mouth daily.     No current facility-administered medications for this visit.    REVIEW OF SYSTEMS:   Constitutional: ( - ) fevers, ( - )  chills , ( - ) night sweats Eyes: ( - ) blurriness of vision, ( - ) double vision, ( - ) watery eyes Ears, nose, mouth, throat, and face: ( - ) mucositis, ( - ) sore throat Respiratory: ( - ) cough, ( - ) dyspnea, ( - ) wheezes Cardiovascular: ( - ) palpitation, ( - ) chest discomfort, ( + ) lower extremity  swelling Gastrointestinal:  ( - ) nausea, ( - ) heartburn, ( - ) change in bowel habits Skin: ( - ) abnormal skin rashes Lymphatics: ( - ) new lymphadenopathy, ( - ) easy bruising Neurological: ( - ) numbness, ( - ) tingling, ( - ) new weaknesses Behavioral/Psych: ( - ) mood change, ( - ) new changes  All other systems were reviewed with the patient and are negative.  PHYSICAL EXAMINATION: ECOG PERFORMANCE STATUS: 1 - Symptomatic but completely ambulatory  Vitals:   12/11/22 1222  BP: 135/68  Pulse: 83  Resp: 17  Temp: 97.9 F (36.6 C)  SpO2: 99%     Filed Weights   12/11/22 1222  Weight: 280 lb 6.4 oz (127.2 kg)   GENERAL: well appearing African American female in NAD, obese SKIN: skin color, texture, turgor are normal, no rashes or significant lesions EYES: conjunctiva are pink and non-injected, sclera clear LUNGS: clear to auscultation and percussion with normal breathing effort HEART: regular rate & rhythm and no murmurs and no lower extremity edema Musculoskeletal: no cyanosis of digits and no clubbing  PSYCH: alert & oriented x 3, fluent speech NEURO: no focal motor/sensory deficits  LABORATORY DATA:  I have reviewed the data as listed    Latest Ref Rng & Units 12/11/2022   11:55 AM 11/28/2022   12:00 AM 10/22/2022    9:27 AM  CBC  WBC 4.0 - 10.5 K/uL 4.9   4.3   Hemoglobin 12.0 - 15.0 g/dL 9.8  9.8  10.5   Hematocrit 36.0 - 46.0 % 32.1   33.1   Platelets 150 - 400 K/uL 195   231.0      This result is from an external source.       Latest Ref Rng & Units 11/28/2022   12:00 AM 10/05/2022   12:49 PM 09/19/2022   11:04 AM  CMP  Glucose 70 - 99 mg/dL  142  135   BUN 4 - 21 38     36  34   Creatinine 0.5 - 1.1 2.4     2.47  2.45   Sodium 137 - 147 138     135  139   Potassium 3.5 - 5.1 mEq/L 5.4     4.8  4.6   Chloride 99 - 108 103     99  103   CO2 13 - '22 27     30  27   '$ Calcium 8.7 - 10.7 9.0     8.7  9.1    9.1      This result is from an external  source.   Multiple values from one day are sorted in reverse-chronological order   ASSESSMENT & PLAN Alicia [name] is a 77 y.o. female presenting to the clinic for follow up of microcytic anemia.   #Microcytic Anemia #Anemia in Setting of CKD  --Multifactorial in the setting of CKD, malabsorption of iron and B12 due to history of gastric bypass.  --previously received epogen injection on 08/08/2022. Nephrology is planning to start retacrit injections on 12/16/2022. Hgb target with retacrit is 10.  --Patient denies any signs of bleeding. She underwent colonoscopy in August 2021 and EGD in March 2022 without any evidence of active or recent bleeding.  --Patient received IV venofer once weekly x 5 doses from 04/28/2021-05/26/2021. --Labs today show Hgb 9.8, MCV 73.6. Iron panel and B12 levels show no evidence of deficiency.  --With presence of microcytosis without iron deficiency, should rule out alpha thalassemia. Testing from 06/13/21 ruled out beta thalassemia or other hemoglobin variant.  --Discussed that with history of gastric bypass, PO iron and B12 will unlikely absorb so recommend parenteral replacement if needed.  --No indication for bone marrow biopsy at this time.  --RTC in 6 months with labs.   No orders of the defined types were placed in this encounter.   All questions were answered. The patient knows to call the clinic with any problems, questions or concerns.  I have spent a total of 30 minutes minutes of face-to-face and non-face-to-face time, preparing to see the patient, performing a medically appropriate examination, counseling and educating the patient, ordering tests, documenting clinical information in the electronic health record, and care coordination.   Dede Query PA-C Dept of Hematology and [place] at [facility] Phone: [phone]

## 2022-12-13 ENCOUNTER — Encounter (HOSPITAL_COMMUNITY): Payer: Self-pay

## 2022-12-13 ENCOUNTER — Telehealth: Payer: Self-pay

## 2022-12-13 ENCOUNTER — Encounter: Payer: Self-pay | Admitting: Physician Assistant

## 2022-12-13 NOTE — Telephone Encounter (Signed)
Please notify patient that she doesn't have iron or B12 deficiencies. Okay to hold iron and B12 pills.   She should proceed with retacrit injections that is planned by her nephrologist   IT we will see her back in 6 months   Pt advised via My Chart and spoke with her sister, [name]

## 2022-12-14 ENCOUNTER — Telehealth: Payer: Self-pay | Admitting: Hematology and Oncology

## 2022-12-14 LAB — METHYLMALONIC ACID, SERUM: Methylmalonic Acid, Quantitative: 266 nmol/L (ref 0–378)

## 2022-12-14 NOTE — Telephone Encounter (Signed)
Called patient to schedule f/u. Left voicemail with new appointment details.

## 2022-12-19 ENCOUNTER — Encounter (HOSPITAL_COMMUNITY)
Admission: RE | Admit: 2022-12-19 | Discharge: 2022-12-19 | Disposition: A | Discharge status: Home / Self Care | Payer: Medicare PPO | Source: Ambulatory Visit | Attending: Nephrology | Admitting: Nephrology

## 2022-12-19 VITALS — BP 143/75 | HR 71 | Temp 97.1°F | Resp 17

## 2022-12-19 DIAGNOSIS — D631 Anemia in chronic kidney disease: Secondary | ICD-10-CM | POA: Diagnosis not present

## 2022-12-19 DIAGNOSIS — I129 Hypertensive chronic kidney disease with stage 1 through stage 4 chronic kidney disease, or unspecified chronic kidney disease: Secondary | ICD-10-CM | POA: Diagnosis not present

## 2022-12-19 DIAGNOSIS — D509 Iron deficiency anemia, unspecified: Secondary | ICD-10-CM | POA: Diagnosis not present

## 2022-12-19 DIAGNOSIS — N184 Chronic kidney disease, stage 4 (severe): Secondary | ICD-10-CM | POA: Insufficient documentation

## 2022-12-19 DIAGNOSIS — D508 Other iron deficiency anemias: Secondary | ICD-10-CM

## 2022-12-19 DIAGNOSIS — E1122 Type 2 diabetes mellitus with diabetic chronic kidney disease: Secondary | ICD-10-CM | POA: Diagnosis not present

## 2022-12-19 DIAGNOSIS — Z90710 Acquired absence of both cervix and uterus: Secondary | ICD-10-CM | POA: Diagnosis not present

## 2022-12-19 DIAGNOSIS — Z9884 Bariatric surgery status: Secondary | ICD-10-CM | POA: Diagnosis not present

## 2022-12-19 DIAGNOSIS — Z800 Family history of malignant neoplasm of digestive organs: Secondary | ICD-10-CM | POA: Diagnosis not present

## 2022-12-19 LAB — RENAL FUNCTION PANEL
Albumin: 3.7 g/dL (ref 3.5–5.0)
Anion gap: 11 (ref 5–15)
BUN: 29 mg/dL — ABNORMAL HIGH (ref 8–23)
CO2: 27 mmol/L (ref 22–32)
Calcium: 8.6 mg/dL — ABNORMAL LOW (ref 8.9–10.3)
Chloride: 100 mmol/L (ref 98–111)
Creatinine, Ser: 2.37 mg/dL — ABNORMAL HIGH (ref 0.44–1.00)
GFR, Estimated: 21 mL/min — ABNORMAL LOW (ref >60)
Glucose, Bld: 170 mg/dL — ABNORMAL HIGH (ref 70–99)
Phosphorus: 4.5 mg/dL (ref 2.5–4.6)
Potassium: 4.2 mmol/L (ref 3.5–5.1)
Sodium: 138 mmol/L (ref 135–145)

## 2022-12-19 LAB — IRON AND TIBC
Iron: 71 ug/dL (ref 28–170)
Saturation Ratios: 25 % (ref 10.4–31.8)
TIBC: 290 ug/dL (ref 250–450)
UIBC: 219 ug/dL

## 2022-12-19 LAB — POCT HEMOGLOBIN-HEMACUE: Hemoglobin: 9.4 g/dL — ABNORMAL LOW (ref 12.0–15.0)

## 2022-12-19 LAB — FERRITIN: Ferritin: 276 ng/mL (ref 11–307)

## 2022-12-19 MED ORDER — EPOETIN ALFA-EPBX 10000 UNIT/ML IJ SOLN
INTRAMUSCULAR | Status: AC
  Filled 2022-12-19: qty 2

## 2022-12-19 MED ORDER — EPOETIN ALFA-EPBX 10000 UNIT/ML IJ SOLN
20000.0000 [IU] | INTRAMUSCULAR | Status: DC
  Administered 2022-12-19: 20000 [IU] via SUBCUTANEOUS

## 2022-12-21 LAB — PTH, INTACT AND CALCIUM
Calcium, Total (PTH): 8.8 mg/dL (ref 8.7–10.3)
PTH: 115 pg/mL — ABNORMAL HIGH (ref 15–65)

## 2023-01-30 ENCOUNTER — Encounter (HOSPITAL_COMMUNITY)
Admission: RE | Admit: 2023-01-30 | Discharge: 2023-01-30 | Disposition: A | Discharge status: Home / Self Care | Payer: Medicare PPO | Source: Ambulatory Visit | Attending: Nephrology | Admitting: Nephrology

## 2023-01-30 VITALS — BP 120/57 | HR 66 | Temp 97.2°F | Resp 17

## 2023-01-30 DIAGNOSIS — D509 Iron deficiency anemia, unspecified: Secondary | ICD-10-CM | POA: Diagnosis not present

## 2023-01-30 DIAGNOSIS — D508 Other iron deficiency anemias: Secondary | ICD-10-CM

## 2023-01-30 DIAGNOSIS — D631 Anemia in chronic kidney disease: Secondary | ICD-10-CM | POA: Insufficient documentation

## 2023-01-30 DIAGNOSIS — N184 Chronic kidney disease, stage 4 (severe): Secondary | ICD-10-CM | POA: Diagnosis not present

## 2023-01-30 LAB — RENAL FUNCTION PANEL
Albumin: 3.8 g/dL (ref 3.5–5.0)
Anion gap: 6 (ref 5–15)
BUN: 29 mg/dL — ABNORMAL HIGH (ref 8–23)
CO2: 27 mmol/L (ref 22–32)
Calcium: 8.9 mg/dL (ref 8.9–10.3)
Chloride: 103 mmol/L (ref 98–111)
Creatinine, Ser: 2.39 mg/dL — ABNORMAL HIGH (ref 0.44–1.00)
GFR, Estimated: 21 mL/min — ABNORMAL LOW (ref >60)
Glucose, Bld: 181 mg/dL — ABNORMAL HIGH (ref 70–99)
Phosphorus: 4.2 mg/dL (ref 2.5–4.6)
Potassium: 4.6 mmol/L (ref 3.5–5.1)
Sodium: 136 mmol/L (ref 135–145)

## 2023-01-30 LAB — POCT HEMOGLOBIN-HEMACUE: Hemoglobin: 10.2 g/dL — ABNORMAL LOW (ref 12.0–15.0)

## 2023-01-30 LAB — IRON AND TIBC
Iron: 70 ug/dL (ref 28–170)
Saturation Ratios: 25 % (ref 10.4–31.8)
TIBC: 277 ug/dL (ref 250–450)
UIBC: 207 ug/dL

## 2023-01-30 LAB — FERRITIN: Ferritin: 233 ng/mL (ref 11–307)

## 2023-01-30 MED ORDER — EPOETIN ALFA-EPBX 10000 UNIT/ML IJ SOLN
INTRAMUSCULAR | Status: AC
  Administered 2023-01-30: 20000 [IU] via SUBCUTANEOUS
  Filled 2023-01-30: qty 2

## 2023-01-30 MED ORDER — EPOETIN ALFA-EPBX 10000 UNIT/ML IJ SOLN: 20000.0000 [IU] | INTRAMUSCULAR | Status: DC

## 2023-01-31 LAB — PTH, INTACT AND CALCIUM
Calcium, Total (PTH): 8.9 mg/dL (ref 8.7–10.3)
PTH: 102 pg/mL — ABNORMAL HIGH (ref 15–65)

## 2023-02-01 ENCOUNTER — Encounter: Payer: Self-pay | Admitting: Nurse Practitioner

## 2023-02-01 ENCOUNTER — Ambulatory Visit: Payer: Medicare PPO | Admitting: Nurse Practitioner

## 2023-02-01 VITALS — BP 128/38 | HR 53 | Temp 98.2°F | Resp 16 | Ht 65.0 in | Wt 282.1 lb

## 2023-02-01 DIAGNOSIS — H6121 Impacted cerumen, right ear: Secondary | ICD-10-CM

## 2023-02-01 NOTE — Progress Notes (Signed)
   Established Patient Office Visit  Subjective   Patient ID: Alicia [name], female    DOB: 1946/05/21  Age: 77 y.o. MRN: [id number]  Chief Complaint  Patient presents with   Diabetes      Ear problem: states that she wears hearing aids and not doing well because of the wax.. States that she is not hurting. States that she did try q tip with peroxide and did not help. States that she is followed by audiology and they cannot see her until may.       Review of Systems  Constitutional:  Negative for chills and fever.  HENT:  Positive for hearing loss. Negative for ear discharge and ear pain.   Respiratory:  Negative for shortness of breath.   Cardiovascular:  Negative for chest pain.      Objective:     BP (!) 128/38   Pulse (!) 53   Temp 98.2 F (36.8 C)   Resp 16   Ht 5\' 5"  (1.651 m)   Wt 282 lb 2 oz (128 kg)   SpO2 99%   BMI 46.95 kg/m  BP Readings from Last 3 Encounters:  02/01/23 (!) 128/38  01/30/23 (!) 120/57  12/19/22 (!) 143/75   Wt Readings from Last 3 Encounters:  02/01/23 282 lb 2 oz (128 kg)  12/11/22 280 lb 6.4 oz (127.2 kg)  11/29/22 280 lb (127 kg)      Physical Exam Vitals and nursing note reviewed.  Constitutional:      Appearance: Normal appearance.  HENT:     Right Ear: Ear canal and external ear normal. There is impacted cerumen.     Left Ear: Tympanic membrane, ear canal and external ear normal. There is no impacted cerumen.  Cardiovascular:     Rate and Rhythm: Normal rate and regular rhythm.     Heart sounds: Normal heart sounds.  Pulmonary:     Effort: Pulmonary effort is normal.     Breath sounds: Normal breath sounds.  Neurological:     Mental Status: She is alert.      No results found for any visits on 02/01/23.    The ASCVD Risk score (Arnett DK, et al., 2019) failed to calculate for the following reasons:   The patient has a prior MI or stroke diagnosis    Assessment & Plan:   Problem List Items Addressed This  Visit       Nervous and Auditory   Impacted cerumen of right ear - Primary    Verbal consent obtained.  Patient was prepped per office policy.  Cerumen softening eardrops was instilled and a mixture of water hydroperoxide was used per office policy.  Patient's right ear was irrigated.  Patient tolerated procedure well.  TM within normal limits after procedure.  Patient felt relief      Relevant Orders   Ear Lavage    Return if symptoms worsen or fail to improve, for As scheduled .    Romilda Garret, NP

## 2023-02-01 NOTE — Assessment & Plan Note (Signed)
Verbal consent obtained.  Patient was prepped per office policy.  Cerumen softening eardrops was instilled and a mixture of water hydroperoxide was used per office policy.  Patient's right ear was irrigated.  Patient tolerated procedure well.  TM within normal limits after procedure.  Patient felt relief

## 2023-02-01 NOTE — Patient Instructions (Addendum)
Nice to see you today Keep your appt with me in may for your diabetes follow up as scheduled in May

## 2023-02-10 NOTE — Progress Notes (Deleted)
Cardiology Office Note   Date:  02/10/2023   ID:  Alicia, [name] [birth year], MRN [id number]  PCP:  Michela Pitcher, NP  Cardiologist:   [name], MD    F/U of CAD     History of Present Illness: Alicia [name] is a 77 y.o. female with a history of CAD status post DES to the RCA 10/2015.  Repeat cath 03/18/2017 for chest pain with DES to diagonal 1, 45% mid LAD treated with POBA, prior RCA stent was patent with a normal LVEF.   Pt also has a history of hypertension, hyperlipidemia, DM 2 and syncope (CT MRI and carotid Dopplers were all normal).  Echocardiogram at that time showed normal LVEF at 65 to 70%.  A 30-day monitor showed normal sinus rhythm with no arrhythmias in which the patient was felt to be dehydrated as her syncopal etiology.   Early 01/2019 patient suffered a right brain CVA treated with TPA.  Echocardiogram with normal LVEF and no source of embolus.  LDL was elevated at 139.  She was continued on Plavix and ASA therapies.   In October she had some chest pain. Cardiac cath performed 09/04/2019 which revealed moderate, nonobstructive coronary artery disease.  Most severe lesions were noted to be 50 to 60% in the mid LAD and 50% ostial D1 which were not thought to be hemodynamically significant by DFR.  Her stents were patent in D1 and proximal RCA.  Plan was to optimize medical therapy.  Her carvedilol was increased to 50 mg twice daily.    I saw the pt in Feb 2021   She was seen by Arnold Long in July 2021  Since seen the pt says she is tired all the time   [name] is OK  She denies CP   Having EGD in Feb 2022 Glu was 80      A1C was 8.1 %  I saw the pt in 2022   [facility] seen by Kathleen Argue in Sept 2023    No outpatient medications have been marked as taking for the 02/11/23 encounter (Appointment) with [name], MD.     Allergies:   Iodinated contrast media and Sulfa antibiotics   Past Medical History:  Diagnosis Date   Anemia    Arthritis    "right knee"  (10/21/2015)   CAD (coronary artery disease)    a. Cath 10/21/15: s/p DES to RCA: moderate diffuse stenosis of mod diagonal, diffuse irregularity LCx and LAD. 2D echo 10/21/15: mod LVH, EF 60-65%, no RWMA, calcified MV, mod-severe LAE. 03/18/17 PCI with DES--> 1 diag   Cataract    CKD (chronic kidney disease), stage IV (HCC)    Diabetes ([place])    Essential hypertension    Family history of adverse reaction to anesthesia    "oldest sister, Alicia [name], related to brain formation at back of head; when they put her to sleep it's hard to wake her up"   GERD (gastroesophageal reflux disease)    History of blood transfusion    "related to ruptured tubal pregnancy"   Hyperlipidemia    Hypertensive heart disease    Morbid obesity ([place])    Stroke (cerebrum) ([place])    Tubular adenoma of colon    Type II diabetes mellitus ([place])     Past Surgical History:  Procedure Laterality Date   [place]   BIOPSY  07/18/2020  Procedure: BIOPSY;  Surgeon: [name], MD;  Location: [facility];  Service: Gastroenterology;;   CARDIAC CATHETERIZATION N/A 10/21/2015   Procedure: Right/Left Heart Cath and Coronary Angiography;  Surgeon: [name], MD;  Location: [place] [facility];  Service: Cardiovascular;  Laterality: N/A;   CHOLECYSTECTOMY OPEN  1984   COLONOSCOPY WITH PROPOFOL N/A 07/18/2020   Procedure: COLONOSCOPY WITH PROPOFOL;  Surgeon: [name], MD;  Location: [facility];  Service: Gastroenterology;  Laterality: N/A;   CORONARY STENT INTERVENTION N/A 03/18/2017   Procedure: Coronary Stent Intervention;  Surgeon: [name], MD;  Location: [place] [facility];  Service: Cardiovascular;  Laterality: N/A;   CORONARY STENT PLACEMENT  03/18/2017   A OPTIMIZE STUDY Drug Eluting Stent (2.5 mm 18 mm - post-dilated to 2.7 mm) was successfully placed   ECTOPIC PREGNANCY SURGERY     ENDOSCOPIC MUCOSAL RESECTION N/A 01/23/2021    Procedure: ENDOSCOPIC MUCOSAL RESECTION;  Surgeon: Irving Copas., MD;  Location: [facility];  Service: Gastroenterology;  Laterality: N/A;   ESOPHAGOGASTRODUODENOSCOPY (EGD) WITH PROPOFOL N/A 07/18/2020   Procedure: ESOPHAGOGASTRODUODENOSCOPY (EGD) WITH PROPOFOL;  Surgeon: [name], MD;  Location: [facility];  Service: Gastroenterology;  Laterality: N/A;   ESOPHAGOGASTRODUODENOSCOPY (EGD) WITH PROPOFOL N/A 01/23/2021   Procedure: ESOPHAGOGASTRODUODENOSCOPY (EGD) WITH PROPOFOL;  Surgeon: Rush Landmark Telford Nab., MD;  Location: [facility];  Service: Gastroenterology;  Laterality: N/A;   EUS N/A 01/23/2021   Procedure: UPPER ENDOSCOPIC ULTRASOUND (EUS) RADIAL;  Surgeon: Irving Copas., MD;  Location: [facility];  Service: Gastroenterology;  Laterality: N/A;   HEMOSTASIS CLIP PLACEMENT  01/23/2021   Procedure: HEMOSTASIS CLIP PLACEMENT;  Surgeon: Irving Copas., MD;  Location: [facility];  Service: Gastroenterology;;   INTRAVASCULAR PRESSURE WIRE/FFR STUDY N/A 09/04/2019   Procedure: INTRAVASCULAR PRESSURE WIRE/FFR STUDY;  Surgeon: Nelva Bush, MD;  Location: [place] [facility];  Service: Cardiovascular;  Laterality: N/A;   LEFT HEART CATH AND CORONARY ANGIOGRAPHY N/A 03/18/2017   Procedure: Left Heart Cath and Coronary Angiography;  Surgeon: [name], MD;  Location: [place] [facility];  Service: Cardiovascular;  Laterality: N/A;   LEFT HEART CATH AND CORONARY ANGIOGRAPHY N/A 09/04/2019   Procedure: LEFT HEART CATH AND CORONARY ANGIOGRAPHY;  Surgeon: Nelva Bush, MD;  Location: [place] [facility];  Service: Cardiovascular;  Laterality: N/A;   POLYPECTOMY  07/18/2020   Procedure: POLYPECTOMY;  Surgeon: [name], MD;  Location: [facility];  Service: Gastroenterology;;   REDUCTION MAMMAPLASTY Bilateral ~ [place] INJECTION  01/23/2021   Procedure: SUBMUCOSAL LIFTING INJECTION;  Surgeon: Irving Copas., MD;  Location: [facility];  Service: Gastroenterology;;   TONSILLECTOMY       Social History:  The patient  reports that she has never smoked. She has never used smokeless tobacco. She reports that she does not drink alcohol and does not use drugs.   Family History:  The patient's family history includes Cancer in her sister; Diabetes in her mother, sister, sister, and sister; Heart attack in her father; Heart attack (age of onset: 60) in her mother; Heart disease in her mother; Hyperlipidemia in her father and mother; Hypertension in her father, mother, and son; Migraines in her daughter and daughter; Stomach cancer in her sister; Stroke (age of onset: 32) in her father.    ROS:  Please see the history of present illness. All other systems are reviewed and  Negative to the above problem except as noted.    PHYSICAL EXAM:  VS:  There were no vitals taken for this visit.  [id number] obese 77 yo in no acute distress  HEENT: normal  Neck: no JVD, carotid bruits  Cardiac: RRR; no murmurs no LE  edema  Respiratory:  clear to auscultation bilaterally,  GI: soft, nontender, nondistended, + BS  No hepatomegaly  MS: no deformity Moving all extremities   Skin: warm and dry, no rash Neuro:  Strength and sensation are intact Psych: euthymic mood, full affect   EKG:  EKG is not  ordered today.   Lipid Panel    Component Value Date/Time   CHOL 108 11/29/2022 1428   CHOL 94 (L) 12/12/2020 1035   TRIG 80.0 11/29/2022 1428   HDL 45.40 11/29/2022 1428   HDL 41 12/12/2020 1035   CHOLHDL 2 11/29/2022 1428   VLDL 16.0 11/29/2022 1428   LDLCALC 46 11/29/2022 1428   LDLCALC 41 12/12/2020 1035      Wt Readings from Last 3 Encounters:  02/01/23 282 lb 2 oz (128 kg)  12/11/22 280 lb 6.4 oz (127.2 kg)  11/29/22 280 lb (127 kg)      ASSESSMENT AND PLAN:  1  CAD Last cath in 2020  No flow limiting disease  Pt without angina   I am not convinced fatigue represents anginal equiv  2  HTN  Adequate control   Continue meds  Check BMET  3.  HL   Will get lipids   4  Hx CVA    Stays on ASA and Plavix    5  Renal  Pt has been seen in renal clinic Get BMET  6  Fatigue  Check TSH   CBC just checked  Mild anemia   F/U in Aug 2022   Current medicines are reviewed at length with the patient today.  The patient does not have concerns regarding medicines.  Signed, [name], MD  02/10/2023 11:11 AM    [address], [place], [place]  [address] Phone: [phone]; Fax: [phone]

## 2023-02-11 ENCOUNTER — Ambulatory Visit: Payer: Medicare PPO | Attending: Internal Medicine | Admitting: Internal Medicine

## 2023-02-21 ENCOUNTER — Other Ambulatory Visit: Payer: Self-pay | Admitting: Internal Medicine

## 2023-02-21 DIAGNOSIS — I2510 Atherosclerotic heart disease of native coronary artery without angina pectoris: Secondary | ICD-10-CM

## 2023-02-21 DIAGNOSIS — E7800 Pure hypercholesterolemia, unspecified: Secondary | ICD-10-CM

## 2023-02-27 DIAGNOSIS — N179 Acute kidney failure, unspecified: Secondary | ICD-10-CM | POA: Diagnosis not present

## 2023-02-27 DIAGNOSIS — E1122 Type 2 diabetes mellitus with diabetic chronic kidney disease: Secondary | ICD-10-CM | POA: Diagnosis not present

## 2023-02-27 DIAGNOSIS — D631 Anemia in chronic kidney disease: Secondary | ICD-10-CM | POA: Diagnosis not present

## 2023-02-27 DIAGNOSIS — I129 Hypertensive chronic kidney disease with stage 1 through stage 4 chronic kidney disease, or unspecified chronic kidney disease: Secondary | ICD-10-CM | POA: Diagnosis not present

## 2023-02-27 DIAGNOSIS — I2510 Atherosclerotic heart disease of native coronary artery without angina pectoris: Secondary | ICD-10-CM | POA: Diagnosis not present

## 2023-02-27 DIAGNOSIS — E785 Hyperlipidemia, unspecified: Secondary | ICD-10-CM | POA: Diagnosis not present

## 2023-02-27 DIAGNOSIS — N184 Chronic kidney disease, stage 4 (severe): Secondary | ICD-10-CM | POA: Diagnosis not present

## 2023-03-13 ENCOUNTER — Ambulatory Visit (HOSPITAL_COMMUNITY)
Admission: RE | Admit: 2023-03-13 | Discharge: 2023-03-13 | Disposition: A | Discharge status: Home / Self Care | Payer: Medicare PPO | Source: Ambulatory Visit | Attending: Nephrology | Admitting: Nephrology

## 2023-03-13 VITALS — BP 137/75 | HR 68 | Temp 97.1°F | Resp 18

## 2023-03-13 DIAGNOSIS — N184 Chronic kidney disease, stage 4 (severe): Secondary | ICD-10-CM | POA: Insufficient documentation

## 2023-03-13 DIAGNOSIS — D631 Anemia in chronic kidney disease: Secondary | ICD-10-CM | POA: Diagnosis not present

## 2023-03-13 DIAGNOSIS — D509 Iron deficiency anemia, unspecified: Secondary | ICD-10-CM | POA: Insufficient documentation

## 2023-03-13 DIAGNOSIS — D508 Other iron deficiency anemias: Secondary | ICD-10-CM | POA: Insufficient documentation

## 2023-03-13 LAB — FERRITIN: Ferritin: 206 ng/mL (ref 11–307)

## 2023-03-13 LAB — RENAL FUNCTION PANEL
Albumin: 3.7 g/dL (ref 3.5–5.0)
Anion gap: 10 (ref 5–15)
BUN: 30 mg/dL — ABNORMAL HIGH (ref 8–23)
CO2: 26 mmol/L (ref 22–32)
Calcium: 9 mg/dL (ref 8.9–10.3)
Chloride: 102 mmol/L (ref 98–111)
Creatinine, Ser: 2.31 mg/dL — ABNORMAL HIGH (ref 0.44–1.00)
GFR, Estimated: 22 mL/min — ABNORMAL LOW (ref >60)
Glucose, Bld: 181 mg/dL — ABNORMAL HIGH (ref 70–99)
Phosphorus: 3.9 mg/dL (ref 2.5–4.6)
Potassium: 4.7 mmol/L (ref 3.5–5.1)
Sodium: 138 mmol/L (ref 135–145)

## 2023-03-13 LAB — IRON AND TIBC
Iron: 70 ug/dL (ref 28–170)
Saturation Ratios: 25 % (ref 10.4–31.8)
TIBC: 283 ug/dL (ref 250–450)
UIBC: 213 ug/dL

## 2023-03-13 LAB — POCT HEMOGLOBIN-HEMACUE: Hemoglobin: 10.5 g/dL — ABNORMAL LOW (ref 12.0–15.0)

## 2023-03-13 MED ORDER — EPOETIN ALFA-EPBX 10000 UNIT/ML IJ SOLN
INTRAMUSCULAR | Status: AC
  Filled 2023-03-13: qty 2

## 2023-03-13 MED ORDER — EPOETIN ALFA-EPBX 10000 UNIT/ML IJ SOLN
20000.0000 [IU] | INTRAMUSCULAR | Status: DC
  Administered 2023-03-13: 20000 [IU] via SUBCUTANEOUS

## 2023-03-14 LAB — PTH, INTACT AND CALCIUM
Calcium, Total (PTH): 8.7 mg/dL (ref 8.7–10.3)
PTH: 80 pg/mL — ABNORMAL HIGH (ref 15–65)

## 2023-03-25 ENCOUNTER — Other Ambulatory Visit: Payer: Self-pay | Admitting: Nurse Practitioner

## 2023-03-25 DIAGNOSIS — Z1231 Encounter for screening mammogram for malignant neoplasm of breast: Secondary | ICD-10-CM

## 2023-04-01 ENCOUNTER — Other Ambulatory Visit (HOSPITAL_COMMUNITY): Payer: Self-pay

## 2023-04-01 ENCOUNTER — Telehealth: Payer: Self-pay

## 2023-04-01 ENCOUNTER — Ambulatory Visit: Payer: Medicare PPO | Admitting: Nurse Practitioner

## 2023-04-02 ENCOUNTER — Encounter: Payer: Self-pay | Admitting: Nurse Practitioner

## 2023-04-03 ENCOUNTER — Ambulatory Visit
Admission: RE | Admit: 2023-04-03 | Discharge: 2023-04-03 | Disposition: A | Discharge status: Home / Self Care | Payer: Medicare PPO | Source: Ambulatory Visit | Attending: Nurse Practitioner | Admitting: Nurse Practitioner

## 2023-04-03 DIAGNOSIS — Z1231 Encounter for screening mammogram for malignant neoplasm of breast: Secondary | ICD-10-CM

## 2023-04-16 ENCOUNTER — Encounter: Payer: Self-pay | Admitting: Nurse Practitioner

## 2023-04-16 ENCOUNTER — Other Ambulatory Visit: Payer: Self-pay

## 2023-04-16 ENCOUNTER — Ambulatory Visit (INDEPENDENT_AMBULATORY_CARE_PROVIDER_SITE_OTHER)
Admission: RE | Admit: 2023-04-16 | Discharge: 2023-04-16 | Disposition: A | Discharge status: Home / Self Care | Payer: Medicare PPO | Source: Ambulatory Visit | Attending: Nurse Practitioner | Admitting: Nurse Practitioner

## 2023-04-16 ENCOUNTER — Ambulatory Visit: Payer: Medicare PPO | Admitting: Nurse Practitioner

## 2023-04-16 VITALS — BP 130/74 | HR 68 | Temp 98.1°F | Resp 16 | Ht 65.0 in | Wt 280.2 lb

## 2023-04-16 DIAGNOSIS — G8929 Other chronic pain: Secondary | ICD-10-CM | POA: Insufficient documentation

## 2023-04-16 DIAGNOSIS — M2550 Pain in unspecified joint: Secondary | ICD-10-CM

## 2023-04-16 DIAGNOSIS — R252 Cramp and spasm: Secondary | ICD-10-CM

## 2023-04-16 DIAGNOSIS — M1712 Unilateral primary osteoarthritis, left knee: Secondary | ICD-10-CM | POA: Diagnosis not present

## 2023-04-16 DIAGNOSIS — M25562 Pain in left knee: Secondary | ICD-10-CM

## 2023-04-16 DIAGNOSIS — E119 Type 2 diabetes mellitus without complications: Secondary | ICD-10-CM | POA: Diagnosis not present

## 2023-04-16 DIAGNOSIS — Z7985 Long-term (current) use of injectable non-insulin antidiabetic drugs: Secondary | ICD-10-CM

## 2023-04-16 LAB — MAGNESIUM: Magnesium: 1.8 mg/dL (ref 1.5–2.5)

## 2023-04-16 LAB — POCT GLYCOSYLATED HEMOGLOBIN (HGB A1C): Hemoglobin A1C: 8.8 % — AB (ref 4.0–5.6)

## 2023-04-16 LAB — URIC ACID: Uric Acid, Serum: 7.4 mg/dL — ABNORMAL HIGH (ref 2.4–7.0)

## 2023-04-16 MED ORDER — TRULICITY 1.5 MG/0.5ML ~~LOC~~ SOAJ
1.5000 mg | SUBCUTANEOUS | 1 refills | Status: DC
Start: 2023-04-16 — End: 2023-11-11

## 2023-04-16 NOTE — Assessment & Plan Note (Signed)
Patient states she has multiple joint pains including her shoulder and knees.  Patient does have CKD followed by nephrology pending uric acid level today

## 2023-04-16 NOTE — Progress Notes (Signed)
Established Patient Office Visit  Subjective   Patient ID: Alicia [name], female    DOB: December 24, 1945  Age: 77 y.o. MRN: [id number]  Chief Complaint  Patient presents with   Diabetes      DM2: patient is currently maintained on Trulicity 0.75mg  once a week Checking Sugars states that it is running 180s and a little higher . States that she will check it twice a day  Hypoglycemia: none per patient report  Hyperglycemia  Diet: states that she is still using the nutrisystem . States that she gets 3 meals a day a snack. States that she does coffee in the am and water the rest of the day.  Knee pain: states that it is both knees left is worse than right. She states she has arthritis in the right one. States that the left one flared up approx 2 weeks ago. No injury or fall per patient report. She has been using topical treatments like Voltaren gel and icy hot. Hurts all the time but worse with movement    Review of Systems  Constitutional:  Negative for chills and fever.  Respiratory:  Negative for shortness of breath.   Cardiovascular:  Negative for chest pain.  Musculoskeletal:  Positive for joint pain. Negative for falls.      Objective:     BP 130/74   Pulse 68   Temp 98.1 F (36.7 C)   Resp 16   Ht 5\' 5"  (1.651 m)   Wt 280 lb 4 oz (127.1 kg)   SpO2 99%   BMI 46.64 kg/m  BP Readings from Last 3 Encounters:  04/16/23 130/74  03/13/23 137/75  02/01/23 (!) 128/38   Wt Readings from Last 3 Encounters:  04/16/23 280 lb 4 oz (127.1 kg)  02/01/23 282 lb 2 oz (128 kg)  12/11/22 280 lb 6.4 oz (127.2 kg)      Physical Exam Vitals and nursing note reviewed.  Constitutional:      Appearance: Normal appearance. She is obese.  Cardiovascular:     Rate and Rhythm: Normal rate and regular rhythm.     Pulses:          Dorsalis pedis pulses are 2+ on the right side and 2+ on the left side.     Heart sounds: Normal heart sounds.  Pulmonary:     Effort: Pulmonary effort is  normal.     Breath sounds: Normal breath sounds.  Abdominal:     General: Bowel sounds are normal.  Musculoskeletal:     Left knee: Bony tenderness present. Tenderness present over the medial joint line. Normal pulse.     Right lower leg: No edema.     Left lower leg: No edema.       Legs:  Feet:     Right foot:     Skin integrity: Skin integrity normal.     Left foot:     Skin integrity: Skin integrity normal.  Neurological:     Mental Status: She is alert.      Results for orders placed or performed in visit on 04/16/23  POCT glycosylated hemoglobin (Hb A1C)  Result Value Ref Range   Hemoglobin A1C 8.8 (A) 4.0 - 5.6 %   HbA1c POC (<> result, manual entry)     HbA1c, POC (prediabetic range)     HbA1c, POC (controlled diabetic range)        The ASCVD Risk score (Arnett DK, et al., 2019) failed to calculate for the following  reasons:   The patient has a prior MI or stroke diagnosis    Assessment & Plan:   Problem List Items Addressed This Visit       Endocrine   Type 2 diabetes mellitus without complication, without long-term current use of insulin (HCC) - Primary    Patient currently maintained on Trulicity 0.75 mg once weekly.  A1c of 8.8 today.  Patient checking glucose twice daily at home.  Will increase Trulicity to 1.5 mg once a week.  New prescription sent in.      Relevant Medications   Dulaglutide (TRULICITY) 1.5 MG/0.5ML SOPN   Other Relevant Orders   POCT glycosylated hemoglobin (Hb A1C) (Completed)     Other   Muscle cramps    Patient is drinking 3 bottles 16.9 fluid ounces of water a day.  Also on a PPI will check magnesium level today.  Patient is followed by hematology and gets infusions for her hemoglobin and iron levels.  latest iron level 70      Relevant Orders   Magnesium   Pain in joint, multiple sites    Patient states she has multiple joint pains including her shoulder and knees.  Patient does have CKD followed by nephrology pending  uric acid level today      Relevant Orders   DG Knee Complete 4 Views Left   Uric acid   Acute pain of left knee    Will check x-ray in office today.  Pending result.  Also check uric acid level as patient does have CKD and is followed by nephrology.  Patient continues ice along with the over-the-counter topical analgesics and Tylenol as needed.      Relevant Orders   DG Knee Complete 4 Views Left    Return in about 3 months (around 07/17/2023) for DM recheck.    Audria Nine, NP

## 2023-04-16 NOTE — Patient Instructions (Signed)
Nice to see you today I will be in touch with the labs and xray once I have reviewed them Follow up with me in 3 months, sooner if you need me I am increasing the dose of the Trulicity from 0.75mg  to 1.5mg 

## 2023-04-16 NOTE — Assessment & Plan Note (Signed)
Patient currently maintained on Trulicity 0.75 mg once weekly.  A1c of 8.8 today.  Patient checking glucose twice daily at home.  Will increase Trulicity to 1.5 mg once a week.  New prescription sent in.

## 2023-04-16 NOTE — Assessment & Plan Note (Signed)
Will check x-ray in office today.  Pending result.  Also check uric acid level as patient does have CKD and is followed by nephrology.  Patient continues ice along with the over-the-counter topical analgesics and Tylenol as needed.

## 2023-04-16 NOTE — Assessment & Plan Note (Signed)
Patient is drinking 3 bottles 16.9 fluid ounces of water a day.  Also on a PPI will check magnesium level today.  Patient is followed by hematology and gets infusions for her hemoglobin and iron levels.  latest iron level 70

## 2023-04-19 ENCOUNTER — Other Ambulatory Visit: Payer: Self-pay | Admitting: Internal Medicine

## 2023-04-19 ENCOUNTER — Other Ambulatory Visit: Payer: Self-pay | Admitting: Nurse Practitioner

## 2023-04-19 ENCOUNTER — Telehealth: Payer: Self-pay | Admitting: Nurse Practitioner

## 2023-04-19 DIAGNOSIS — K219 Gastro-esophageal reflux disease without esophagitis: Secondary | ICD-10-CM

## 2023-04-19 NOTE — Telephone Encounter (Signed)
Spoke to patient please see lab results notes

## 2023-04-19 NOTE — Telephone Encounter (Signed)
Pt called in stated she would like to discuss her xray results . Got the info about her labs [phone]

## 2023-04-19 NOTE — Telephone Encounter (Signed)
Pt return call to office would like a call back to discuss Xray results

## 2023-04-22 ENCOUNTER — Telehealth: Payer: Self-pay | Admitting: Nurse Practitioner

## 2023-04-22 ENCOUNTER — Other Ambulatory Visit: Payer: Self-pay | Admitting: Nurse Practitioner

## 2023-04-22 MED ORDER — PREDNISONE 20 MG PO TABS: 40.0000 mg | ORAL_TABLET | Freq: Every day | ORAL | 0 refills | Status: AC

## 2023-04-22 NOTE — Telephone Encounter (Signed)
Called and read the results of the x-ray and informed her about the medication that was sent in. I did let her know that the medication may make her sugar go up.

## 2023-04-22 NOTE — Telephone Encounter (Signed)
Called and read the results of the x-ray and informed her about the medication that was sent in. I did let her know that the medication may make her sugar go up. 

## 2023-04-22 NOTE — Telephone Encounter (Signed)
Medication sent in. It is prednisone so it will make her sugars go up. Please make her aware

## 2023-04-22 NOTE — Telephone Encounter (Signed)
Left message to return call to our office.  

## 2023-04-22 NOTE — Telephone Encounter (Signed)
-----   Message from Donnamarie Poag, [place] sent at 04/19/2023 10:58 AM EDT ----- Called patient reviewed all information and repeated back to me. Will call if any questions.  Patient is ok with starting medication. Would like sent to [facility] [address]

## 2023-04-24 ENCOUNTER — Encounter (HOSPITAL_COMMUNITY)
Admission: RE | Admit: 2023-04-24 | Discharge: 2023-04-24 | Disposition: A | Discharge status: Home / Self Care | Payer: Medicare PPO | Source: Ambulatory Visit | Attending: Nephrology | Admitting: Nephrology

## 2023-04-24 VITALS — BP 119/76 | HR 69 | Temp 97.3°F | Resp 17

## 2023-04-24 DIAGNOSIS — N184 Chronic kidney disease, stage 4 (severe): Secondary | ICD-10-CM | POA: Diagnosis not present

## 2023-04-24 DIAGNOSIS — D509 Iron deficiency anemia, unspecified: Secondary | ICD-10-CM | POA: Insufficient documentation

## 2023-04-24 DIAGNOSIS — D631 Anemia in chronic kidney disease: Secondary | ICD-10-CM | POA: Insufficient documentation

## 2023-04-24 DIAGNOSIS — D508 Other iron deficiency anemias: Secondary | ICD-10-CM | POA: Insufficient documentation

## 2023-04-24 LAB — RENAL FUNCTION PANEL
Albumin: 3.6 g/dL (ref 3.5–5.0)
Anion gap: 9 (ref 5–15)
BUN: 37 mg/dL — ABNORMAL HIGH (ref 8–23)
CO2: 27 mmol/L (ref 22–32)
Calcium: 8.8 mg/dL — ABNORMAL LOW (ref 8.9–10.3)
Chloride: 103 mmol/L (ref 98–111)
Creatinine, Ser: 2.44 mg/dL — ABNORMAL HIGH (ref 0.44–1.00)
GFR, Estimated: 20 mL/min — ABNORMAL LOW (ref >60)
Glucose, Bld: 186 mg/dL — ABNORMAL HIGH (ref 70–99)
Phosphorus: 3.9 mg/dL (ref 2.5–4.6)
Potassium: 4.3 mmol/L (ref 3.5–5.1)
Sodium: 139 mmol/L (ref 135–145)

## 2023-04-24 LAB — IRON AND TIBC
Iron: 65 ug/dL (ref 28–170)
Saturation Ratios: 25 % (ref 10.4–31.8)
TIBC: 263 ug/dL (ref 250–450)
UIBC: 198 ug/dL

## 2023-04-24 LAB — FERRITIN: Ferritin: 206 ng/mL (ref 11–307)

## 2023-04-24 LAB — POCT HEMOGLOBIN-HEMACUE: Hemoglobin: 10.5 g/dL — ABNORMAL LOW (ref 12.0–15.0)

## 2023-04-24 MED ORDER — EPOETIN ALFA-EPBX 10000 UNIT/ML IJ SOLN
20000.0000 [IU] | INTRAMUSCULAR | Status: DC
  Administered 2023-04-24: 20000 [IU] via SUBCUTANEOUS

## 2023-04-24 MED ORDER — EPOETIN ALFA-EPBX 10000 UNIT/ML IJ SOLN
INTRAMUSCULAR | Status: AC
  Filled 2023-04-24: qty 2

## 2023-04-26 LAB — PTH, INTACT AND CALCIUM
Calcium, Total (PTH): 8.8 mg/dL (ref 8.7–10.3)
PTH: 84 pg/mL — ABNORMAL HIGH (ref 15–65)

## 2023-05-14 NOTE — Progress Notes (Unsigned)
Cardiology Office Note   Date:  05/15/2023   ID:  Alicia [name], Alicia [name] Mar 04, 1946, MRN [id number]  PCP:  [name], NP  Cardiologist:   Dietrich Pates, MD    F/U of CAD     History of Present Illness: Alicia [name] is a 77 y.o. female with a history of CAD status post DES to the RCA 10/2015.  Repeat cath 03/18/2017 for chest pain with DES to diagonal 1, 45% mid LAD treated with POBA, prior RCA stent was patent with a normal LVEF.   Pt also has a history of hypertension, hyperlipidemia, DM 2 and syncope (CT MRI and carotid Dopplers were all normal).  Echocardiogram at that time showed normal LVEF at 65 to 70%.  A 30-day monitor showed normal sinus rhythm with no arrhythmias in which the patient was felt to be dehydrated as her syncopal etiology.   Early 01/2019 patient suffered a right brain CVA treated with TPA.  Echocardiogram with normal LVEF and no source of embolus.  LDL was elevated at 139.  She was continued on Plavix and ASA therapies.   In October she had some chest pain. Cardiac cath performed 09/04/2019 which revealed moderate, nonobstructive coronary artery disease.  Most severe lesions were noted to be 50 to 60% in the mid LAD and 50% ostial D1 which were not thought to be hemodynamically significant by DFR.  Her stents were patent in D1 and proximal RCA.  Plan was to optimize medical therapy.  Her carvedilol was increased to 50 mg twice daily.   I saw the pt in jan 2022   Since then she has been seen by B Bhagat.  Complained of increasing dyspnea  Set up for myoview which was normal and echo which was normal   Also seen by Wende Mott     Since seen she has been doing OK   She had to resched appt with me since her husband died      She denies CP   Current Meds  Medication Sig   amLODipine (NORVASC) 10 MG tablet Take 1 tablet (10 mg total) by mouth at bedtime.   aspirin EC 81 MG tablet Take 1 tablet (81 mg total) by mouth daily. Swallow whole.   atorvastatin (LIPITOR) 80 MG  tablet TAKE 1 TABLET BY MOUTH DAILY AT 6 PM.   carvedilol (COREG) 25 MG tablet Take 1 tablet (25 mg total) by mouth 2 (two) times daily with a meal.   Cholecalciferol (VITAMIN D3) 50 MCG (2000 UT) TABS Take 2,000 Units by mouth daily.   clopidogrel (PLAVIX) 75 MG tablet TAKE 1 TABLET BY MOUTH EVERY DAY   doxylamine, Sleep, (UNISOM) 25 MG tablet Take 25 mg by mouth at bedtime as needed for sleep.   Dulaglutide (TRULICITY) 1.5 MG/0.5ML SOPN Inject 1.5 mg into the skin once a week.   ferrous sulfate 325 (65 FE) MG tablet TAKE 1 TABLET BY MOUTH EVERY DAY WITH BREAKFAST   furosemide (LASIX) 40 MG tablet Take 40 mg by mouth daily.   glucose blood (ONETOUCH ULTRA) test strip 1 each by Other route 2 (two) times daily. LAST REFILL SCHEDULE PHYSICAL   isosorbide mononitrate (IMDUR) 60 MG 24 hr tablet TAKE 1 TABLET BY MOUTH EVERY DAY   Magnesium 200 MG TABS Take 200 mg by mouth daily.   nitroGLYCERIN (NITROSTAT) 0.4 MG SL tablet Place 1 tablet (0.4 mg total) under the tongue every 5 (five) minutes as needed for chest pain.   ondansetron (  ZOFRAN-ODT) 4 MG disintegrating tablet TAKE 1 TABLET BY MOUTH EVERY 8 HOURS AS NEEDED FOR NAUSEA AND VOMITING   pantoprazole (PROTONIX) 40 MG tablet TAKE 1 TABLET BY MOUTH EVERY DAY   spironolactone (ALDACTONE) 25 MG tablet TAKE 1 TABLET BY MOUTH EVERY DAY   vitamin B-12 (CYANOCOBALAMIN) 500 MCG tablet Take 2 tablets (1,000 mcg total) by mouth daily.     Allergies:   Iodinated contrast media and Sulfa antibiotics   Past Medical History:  Diagnosis Date   Anemia    Arthritis    "right knee" (10/21/2015)   CAD (coronary artery disease)    a. Cath 10/21/15: s/p DES to RCA: moderate diffuse stenosis of mod diagonal, diffuse irregularity LCx and LAD. 2D echo 10/21/15: mod LVH, EF 60-65%, no RWMA, calcified MV, mod-severe LAE. 03/18/17 PCI with DES--> 1 diag   Cataract    CKD (chronic kidney disease), stage IV (HCC)    Diabetes (HCC)    Essential hypertension    Family  history of adverse reaction to anesthesia    "oldest sister, Malachi Bonds, related to brain formation at back of head; when they put her to sleep it's hard to wake her up"   GERD (gastroesophageal reflux disease)    History of blood transfusion    "related to ruptured tubal pregnancy"   Hyperlipidemia    Hypertensive heart disease    Morbid obesity (HCC)    Stroke (cerebrum) (HCC)    Tubular adenoma of colon    Type II diabetes mellitus (HCC)     Past Surgical History:  Procedure Laterality Date   ABDOMINAL HYSTERECTOMY  1988   fibroid   APPENDECTOMY  1985   BARIATRIC SURGERY  1984   BIOPSY  07/18/2020   Procedure: BIOPSY;  Surgeon: Beverley Fiedler, MD;  Location: [facility];  Service: Gastroenterology;;   CARDIAC CATHETERIZATION N/A 10/21/2015   Procedure: Right/Left Heart Cath and Coronary Angiography;  Surgeon: Tonny Bollman, MD;  Location: [facility] INVASIVE CV LAB;  Service: Cardiovascular;  Laterality: N/A;   CHOLECYSTECTOMY OPEN  1984   COLONOSCOPY WITH PROPOFOL N/A 07/18/2020   Procedure: COLONOSCOPY WITH PROPOFOL;  Surgeon: Beverley Fiedler, MD;  Location: [facility];  Service: Gastroenterology;  Laterality: N/A;   CORONARY PRESSURE/FFR STUDY N/A 09/04/2019   Procedure: INTRAVASCULAR PRESSURE WIRE/FFR STUDY;  Surgeon: Yvonne Kendall, MD;  Location: [facility] INVASIVE CV LAB;  Service: Cardiovascular;  Laterality: N/A;   CORONARY STENT INTERVENTION N/A 03/18/2017   Procedure: Coronary Stent Intervention;  Surgeon: Marykay Lex, MD;  Location[facility] INVAS[facility] CV LAB;  Service: Cardiovascular;  Laterality: N/A;   CORONARY STENT PLACEMENT  03/18/2017   A OPTIMIZE STUDY Drug Eluting Stent (2.5 mm 18 mm - post-dilated to 2.7 mm) was successfully placed   ECTOPIC PREGNANCY SURGERY     ENDOSCOPIC MUCOSAL RESECTION N/A 01/23/2021   Procedure: ENDOSCOPIC MUCOSAL RESECTION;  Surgeon: Lemar Lofty., MD;  Location: [facility];  Service: Gastroenterology;  Laterality: N/A;    ESOPHAGOGASTRODUODENOSCOPY (EGD) WITH PROPOFOL N/A 07/18/2020   Procedure: ESOPHAGOGASTRODUODENOSCOPY (EGD) WITH PROPOFOL;  Surgeon: Beverley Fiedler, MD;  Location: [facility];  Service: Gastroenterology;  Laterality: N/A;   ESOPHAGOGASTRODUODENOSCOPY (EGD) WITH PROPOFOL N/A 01/23/2021   Procedure: ESOPHAGOGASTRODUODENOSCOPY (EGD) WITH PROPOFOL;  Surgeon: Meridee Score Netty Starring., MD;  Location: [facility];  Service: Gastroenterology;  Laterality: N/A;   EUS N/A 01/23/2021   Procedure: UPPER ENDOSCOPIC ULTRASOUND (EUS) RADIAL;  Surgeon: Lemar Lofty., MD;  Location: [facility];  Service: Gastroenterology;  Laterality: N/A;  HEMOSTASIS CLIP PLACEMENT  01/23/2021   Procedure: HEMOSTASIS CLIP PLACEMENT;  Surgeon: Lemar Lofty., MD;  Location: [facility];  Service: Gastroenterology;;   LEFT HEART CATH AND CORONARY ANGIOGRAPHY N/A 03/18/2017   Procedure: Left Heart Cath and Coronary Angiography;  Surgeon: Marykay Lex, MD;  Location: [facility] INVASIVE CV LAB;  Service: Cardiovascular;  Laterality: N/A;   LEFT HEART CATH AND CORONARY ANGIOGRAPHY N/A 09/04/2019   Procedure: LEFT HEART CATH AND CORONARY ANGIOGRAPHY;  Surgeon: Yvonne Kendall, MD;  Location: [facility] INVASIVE CV LAB;  Service: Cardiovascular;  Laterality: N/A;   POLYPECTOMY  07/18/2020   Procedure: POLYPECTOMY;  Surgeon: Beverley Fiedler, MD;  Location: [facility];  Service: Gastroenterology;;   REDUCTION MAMMAPLASTY Bilateral ~ 1976   SUBMUCOSAL LIFTING INJECTION  01/23/2021   Procedure: SUBMUCOSAL LIFTING INJECTION;  Surgeon: Lemar Lofty., MD;  Location: [facility];  Service: Gastroenterology;;   TONSILLECTOMY       Social History:  The patient  reports that she has never smoked. She has never used smokeless tobacco. She reports that she does not drink alcohol and does not use drugs.   Family History:  The patient's family history includes Cancer in her sister; Diabetes in her mother, sister, sister, and sister;  Heart attack in her father; Heart attack (age of onset: 68) in her mother; Heart disease in her mother; Hyperlipidemia in her father and mother; Hypertension in her father, mother, and son; Migraines in her daughter and daughter; Stomach cancer in her sister; Stroke (age of onset: 80) in her father.    ROS:  Please see the history of present illness. All other systems are reviewed and  Negative to the above problem except as noted.    PHYSICAL EXAM: VS:  BP 128/68   Pulse 74   Ht 5\' 5"  (1.651 m)   Wt 275 lb 6.4 oz (124.9 kg)   SpO2 97%   BMI 45.83 kg/m   ZOX:WRUEAVWU obese 77 yo in no acute distress  HEENT: normal  Neck: no obvious JVD,  no carotid bruit Cardiac: RRR; no murmurs.  Tr LE  edema  Respiratory:  clear to auscultation GI: soft, nontender,  No hepatomegaly    EKG:  EKG is not  ordered today.  Prior CV Studies: GATED SPECT MYO PERF W/LEXISCAN STRESS 1D 07/25/2022 Normal perfusion. LVEF 66% with normal wall motion. This is a low risk study. No change compared to a prior study in 2006.   ECHO COMPLETE WO IMAGING ENHANCING AGENT 07/24/2022 EF 60-65, no RWMA, mild asymmetric LVH, GLS -19.7, normal RVSF, normal PASP (RVSP 26.7), mild to moderate LAE, mild MR, RA pressure 8   LEFT HEART CATH 09/04/2019 Conclusions: 1. Moderate, non-obstructive coronary artery disease.  Most severe lesions are 50-60% mid LAD and 50% ostial D1 stenoses, which are not hemodynamically significant by DFR. 2. Widely patent D1 and proximal RCA stents. 3. Normal left ventricular filling pressure.   CARDIAC TELEMETRY MONITORING-INTERPRETATION ONLY 07/31/2017 Poor quality transmission on a few strips Sinus rhythm.  No arrhythmias detected    Lipid Panel    Component Value Date/Time   CHOL 108 11/29/2022 1428   CHOL 94 (L) 12/12/2020 1035   TRIG 80.0 11/29/2022 1428   HDL 45.40 11/29/2022 1428   HDL 41 12/12/2020 1035   CHOLHDL 2 11/29/2022 1428   VLDL 16.0 11/29/2022 1428   LDLCALC 46  11/29/2022 1428   LDLCALC 41 12/12/2020 1035      Wt Readings from Last 3 Encounters:  05/15/23  275 lb 6.4 oz (124.9 kg)  04/16/23 280 lb 4 oz (127.1 kg)  02/01/23 282 lb 2 oz (128 kg)      ASSESSMENT AND PLAN:  1  CAD   Last cath in 2020 patent stents Moderate Dz   Myoview in Sept 2023 showed normal perfusion    Follow      2  HTN  Controlled on current regimen  3.  HL   LDL 46  HDL 45  Trig 39     4  Hx CVA    Keep on ASA and Plavix    5  Renal  Last Cr 2.44  Follows with renal    F/U in Dec / Jan of this winter   Current medicines are reviewed at length with the patient today.  The patient does not have concerns regarding medicines.  Signed, Dietrich Pates, MD  05/15/2023 1:33 PM    [facility] [address] [place], [place], [place]  [address] Phone: [phone]; Fax: [phone]

## 2023-05-15 ENCOUNTER — Ambulatory Visit: Payer: Medicare PPO | Attending: Internal Medicine | Admitting: Internal Medicine

## 2023-05-15 ENCOUNTER — Encounter: Payer: Self-pay | Admitting: Internal Medicine

## 2023-05-15 DIAGNOSIS — I10 Essential (primary) hypertension: Secondary | ICD-10-CM | POA: Diagnosis not present

## 2023-05-15 MED ORDER — AMLODIPINE BESYLATE 10 MG PO TABS
10.0000 mg | ORAL_TABLET | Freq: Every day | ORAL | 3 refills | Status: DC
Start: 2023-05-15 — End: 2024-01-03

## 2023-05-15 MED ORDER — SPIRONOLACTONE 25 MG PO TABS
25.0000 mg | ORAL_TABLET | Freq: Every day | ORAL | 3 refills | Status: DC
Start: 2023-05-15 — End: 2024-03-25

## 2023-05-15 MED ORDER — ATORVASTATIN CALCIUM 80 MG PO TABS: ORAL_TABLET | ORAL | 3 refills | Status: DC

## 2023-05-15 MED ORDER — FUROSEMIDE 40 MG PO TABS: 40.0000 mg | ORAL_TABLET | Freq: Every day | ORAL | 3 refills | Status: DC

## 2023-05-15 NOTE — Patient Instructions (Signed)
Medication Instructions:  Your physician recommends that you continue on your current medications as directed. Please refer to the Current Medication list given to you today.  *If you need a refill on your cardiac medications before your next appointment, please call your pharmacy*  Lab Work: None ordered today.  Testing/Procedures: None ordered today.  Follow-Up: At [facility], you and your health needs are our priority.  As part of our continuing mission to provide you with exceptional heart care, we have created designated Provider Care Teams.  These Care Teams include your primary Cardiologist (physician) and Advanced Practice Providers (APPs -  Physician Assistants and Nurse Practitioners) who all work together to provide you with the care you need, when you need it.  Your next appointment:   6-7 month(s) December 2024/January 2025  The format for your next appointment:   In Person  Provider:   Dietrich Pates, MD {

## 2023-05-16 ENCOUNTER — Telehealth: Payer: Self-pay | Admitting: Pharmacist

## 2023-05-16 NOTE — Telephone Encounter (Signed)
Attempted PA. PA already on file through 11/19/23. Tried to call pt but no answer. Pt was contacted in another encounter.

## 2023-05-16 NOTE — Telephone Encounter (Signed)
-----   Message from [place], [place] L sent at 05/15/2023  1:34 PM EDT ----- Regarding: Repatha Hello,  Patient saw Dr. Tenny Craw today and told her she has not been able to refill her Repatha. She states it was due to an insurance issue on our end. Perhaps it needs prior auth? Not sure, can someone look into this and see if we can get it refilled for her, please?  Thank you, Danford Bad

## 2023-05-20 ENCOUNTER — Telehealth: Payer: Self-pay

## 2023-05-20 ENCOUNTER — Other Ambulatory Visit (HOSPITAL_COMMUNITY): Payer: Self-pay

## 2023-05-20 NOTE — Telephone Encounter (Signed)
-----   Message from Awilda Metro, RPH-CPP sent at 05/15/2023  2:19 PM EDT ----- Regarding: FW: Repatha  ----- Message ----- From: Franchot Gallo Sent: 05/15/2023   1:36 PM EDT To: [facility] Subject: Repatha                                        Hello,  Patient saw Dr. Tenny Craw today and told her she has not been able to refill her Repatha. She states it was due to an insurance issue on our end. Perhaps it needs prior auth? Not sure, can someone look into this and see if we can get it refilled for her, please?  Thank you, Danford Bad

## 2023-05-20 NOTE — Telephone Encounter (Signed)
Called pt, she is aware.

## 2023-05-20 NOTE — Telephone Encounter (Signed)
Per test claim no PA required. [employer] paid 1 month claim with a copay of $40

## 2023-05-28 ENCOUNTER — Other Ambulatory Visit: Payer: Self-pay | Admitting: Pharmacist

## 2023-05-28 NOTE — Progress Notes (Signed)
Pharmacy Quality Measure Review  This patient is appearing on a report for being at risk of failing the adherence measure for diabetes medications this calendar year.   Medication: Trulicity 1.5 mg weekly Last fill date: 05/25/23 for 84 day supply  Insurance report was not up to date. No action needed at this time.   Catie Eppie Gibson, PharmD, BCACP, CPP Clinical Pharmacist [facility] [phone]

## 2023-06-05 ENCOUNTER — Ambulatory Visit (HOSPITAL_COMMUNITY)
Admission: RE | Admit: 2023-06-05 | Discharge: 2023-06-05 | Disposition: A | Discharge status: Home / Self Care | Payer: Medicare PPO | Source: Ambulatory Visit | Attending: Nephrology | Admitting: Nephrology

## 2023-06-05 VITALS — BP 131/70 | HR 67 | Temp 97.3°F | Resp 17

## 2023-06-05 DIAGNOSIS — N184 Chronic kidney disease, stage 4 (severe): Secondary | ICD-10-CM | POA: Diagnosis not present

## 2023-06-05 DIAGNOSIS — D509 Iron deficiency anemia, unspecified: Secondary | ICD-10-CM | POA: Insufficient documentation

## 2023-06-05 DIAGNOSIS — D631 Anemia in chronic kidney disease: Secondary | ICD-10-CM | POA: Diagnosis not present

## 2023-06-05 DIAGNOSIS — D508 Other iron deficiency anemias: Secondary | ICD-10-CM | POA: Insufficient documentation

## 2023-06-05 LAB — IRON AND TIBC
Iron: 66 ug/dL (ref 28–170)
Saturation Ratios: 25 % (ref 10.4–31.8)
TIBC: 266 ug/dL (ref 250–450)
UIBC: 200 ug/dL

## 2023-06-05 LAB — RENAL FUNCTION PANEL
Albumin: 3.5 g/dL (ref 3.5–5.0)
Anion gap: 10 (ref 5–15)
BUN: 32 mg/dL — ABNORMAL HIGH (ref 8–23)
CO2: 26 mmol/L (ref 22–32)
Calcium: 8.7 mg/dL — ABNORMAL LOW (ref 8.9–10.3)
Chloride: 102 mmol/L (ref 98–111)
Creatinine, Ser: 2.31 mg/dL — ABNORMAL HIGH (ref 0.44–1.00)
GFR, Estimated: 22 mL/min — ABNORMAL LOW (ref >60)
Glucose, Bld: 187 mg/dL — ABNORMAL HIGH (ref 70–99)
Phosphorus: 4.2 mg/dL (ref 2.5–4.6)
Potassium: 4.1 mmol/L (ref 3.5–5.1)
Sodium: 138 mmol/L (ref 135–145)

## 2023-06-05 LAB — FERRITIN: Ferritin: 337 ng/mL — ABNORMAL HIGH (ref 11–307)

## 2023-06-05 LAB — POCT HEMOGLOBIN-HEMACUE: Hemoglobin: 10 g/dL — ABNORMAL LOW (ref 12.0–15.0)

## 2023-06-05 MED ORDER — EPOETIN ALFA-EPBX 10000 UNIT/ML IJ SOLN
INTRAMUSCULAR | Status: AC
  Filled 2023-06-05: qty 2

## 2023-06-05 MED ORDER — EPOETIN ALFA-EPBX 10000 UNIT/ML IJ SOLN
20000.0000 [IU] | INTRAMUSCULAR | Status: DC
  Administered 2023-06-05: 20000 [IU] via SUBCUTANEOUS

## 2023-06-07 LAB — PTH, INTACT AND CALCIUM
Calcium, Total (PTH): 8.9 mg/dL (ref 8.7–10.3)
PTH: 91 pg/mL — ABNORMAL HIGH (ref 15–65)

## 2023-06-10 ENCOUNTER — Other Ambulatory Visit: Payer: Self-pay | Admitting: Physician Assistant

## 2023-06-10 DIAGNOSIS — N179 Acute kidney failure, unspecified: Secondary | ICD-10-CM | POA: Diagnosis not present

## 2023-06-10 DIAGNOSIS — E1122 Type 2 diabetes mellitus with diabetic chronic kidney disease: Secondary | ICD-10-CM | POA: Diagnosis not present

## 2023-06-10 DIAGNOSIS — N184 Chronic kidney disease, stage 4 (severe): Secondary | ICD-10-CM | POA: Diagnosis not present

## 2023-06-10 DIAGNOSIS — E785 Hyperlipidemia, unspecified: Secondary | ICD-10-CM | POA: Diagnosis not present

## 2023-06-10 DIAGNOSIS — I129 Hypertensive chronic kidney disease with stage 1 through stage 4 chronic kidney disease, or unspecified chronic kidney disease: Secondary | ICD-10-CM | POA: Diagnosis not present

## 2023-06-10 DIAGNOSIS — D509 Iron deficiency anemia, unspecified: Secondary | ICD-10-CM

## 2023-06-10 DIAGNOSIS — I2510 Atherosclerotic heart disease of native coronary artery without angina pectoris: Secondary | ICD-10-CM | POA: Diagnosis not present

## 2023-06-10 DIAGNOSIS — D631 Anemia in chronic kidney disease: Secondary | ICD-10-CM | POA: Diagnosis not present

## 2023-06-11 ENCOUNTER — Inpatient Hospital Stay: Payer: Medicare PPO | Attending: Physician Assistant

## 2023-06-11 ENCOUNTER — Ambulatory Visit: Payer: Medicare PPO | Admitting: Physician Assistant

## 2023-06-11 DIAGNOSIS — N189 Chronic kidney disease, unspecified: Secondary | ICD-10-CM | POA: Insufficient documentation

## 2023-06-11 DIAGNOSIS — D631 Anemia in chronic kidney disease: Secondary | ICD-10-CM | POA: Insufficient documentation

## 2023-06-11 DIAGNOSIS — D509 Iron deficiency anemia, unspecified: Secondary | ICD-10-CM | POA: Insufficient documentation

## 2023-06-12 ENCOUNTER — Telehealth: Payer: Self-pay | Admitting: Physician Assistant

## 2023-06-15 ENCOUNTER — Other Ambulatory Visit: Payer: Self-pay | Admitting: Nurse Practitioner

## 2023-06-15 DIAGNOSIS — E119 Type 2 diabetes mellitus without complications: Secondary | ICD-10-CM

## 2023-06-18 ENCOUNTER — Inpatient Hospital Stay: Payer: Medicare PPO

## 2023-06-18 ENCOUNTER — Inpatient Hospital Stay (HOSPITAL_BASED_OUTPATIENT_CLINIC_OR_DEPARTMENT_OTHER): Payer: Medicare PPO | Admitting: Physician Assistant

## 2023-06-18 ENCOUNTER — Other Ambulatory Visit: Payer: Self-pay

## 2023-06-18 VITALS — BP 127/68 | HR 78 | Temp 98.2°F | Resp 18 | Wt 283.4 lb

## 2023-06-18 DIAGNOSIS — D509 Iron deficiency anemia, unspecified: Secondary | ICD-10-CM

## 2023-06-18 DIAGNOSIS — D631 Anemia in chronic kidney disease: Secondary | ICD-10-CM | POA: Diagnosis not present

## 2023-06-18 DIAGNOSIS — N189 Chronic kidney disease, unspecified: Secondary | ICD-10-CM | POA: Diagnosis not present

## 2023-06-18 LAB — CBC WITH DIFFERENTIAL (CANCER CENTER ONLY)
Abs Immature Granulocytes: 0.01 10*3/uL (ref 0.00–0.07)
Basophils Absolute: 0 10*3/uL (ref 0.0–0.1)
Basophils Relative: 1 %
Eosinophils Absolute: 0.2 10*3/uL (ref 0.0–0.5)
Eosinophils Relative: 5 %
HCT: 32.1 % — ABNORMAL LOW (ref 36.0–46.0)
Hemoglobin: 9.7 g/dL — ABNORMAL LOW (ref 12.0–15.0)
Immature Granulocytes: 0 %
Lymphocytes Relative: 33 %
Lymphs Abs: 1.3 10*3/uL (ref 0.7–4.0)
MCH: 22.6 pg — ABNORMAL LOW (ref 26.0–34.0)
MCHC: 30.2 g/dL (ref 30.0–36.0)
MCV: 74.7 fL — ABNORMAL LOW (ref 80.0–100.0)
Monocytes Absolute: 0.4 10*3/uL (ref 0.1–1.0)
Monocytes Relative: 9 %
Neutro Abs: 2.1 10*3/uL (ref 1.7–7.7)
Neutrophils Relative %: 52 %
Platelet Count: 200 10*3/uL (ref 150–400)
RBC: 4.3 MIL/uL (ref 3.87–5.11)
RDW: 16.8 % — ABNORMAL HIGH (ref 11.5–15.5)
WBC Count: 4 10*3/uL (ref 4.0–10.5)
nRBC: 0 % (ref 0.0–0.2)

## 2023-06-18 LAB — CMP (CANCER CENTER ONLY)
ALT: 9 U/L (ref 0–44)
AST: 12 U/L — ABNORMAL LOW (ref 15–41)
Albumin: 3.8 g/dL (ref 3.5–5.0)
Alkaline Phosphatase: 124 U/L (ref 38–126)
Anion gap: 7 (ref 5–15)
BUN: 37 mg/dL — ABNORMAL HIGH (ref 8–23)
CO2: 26 mmol/L (ref 22–32)
Calcium: 8.6 mg/dL — ABNORMAL LOW (ref 8.9–10.3)
Chloride: 104 mmol/L (ref 98–111)
Creatinine: 2.59 mg/dL — ABNORMAL HIGH (ref 0.44–1.00)
GFR, Estimated: 19 mL/min — ABNORMAL LOW (ref >60)
Glucose, Bld: 289 mg/dL — ABNORMAL HIGH (ref 70–99)
Potassium: 5.3 mmol/L — ABNORMAL HIGH (ref 3.5–5.1)
Sodium: 137 mmol/L (ref 135–145)
Total Bilirubin: 0.6 mg/dL (ref 0.3–1.2)
Total Protein: 6.2 g/dL — ABNORMAL LOW (ref 6.5–8.1)

## 2023-06-18 LAB — VITAMIN B12: Vitamin B-12: 2075 pg/mL — ABNORMAL HIGH (ref 180–914)

## 2023-06-18 LAB — IRON AND IRON BINDING CAPACITY (CC-WL,HP ONLY)
Iron: 71 ug/dL (ref 28–170)
Saturation Ratios: 29 % (ref 10.4–31.8)
TIBC: 245 ug/dL — ABNORMAL LOW (ref 250–450)
UIBC: 174 ug/dL (ref 148–442)

## 2023-06-18 LAB — RETIC PANEL
Immature Retic Fract: 13.2 % (ref 2.3–15.9)
RBC.: 4.32 MIL/uL (ref 3.87–5.11)
Retic Count, Absolute: 106.3 10*3/uL (ref 19.0–186.0)
Retic Ct Pct: 2.5 % (ref 0.4–3.1)
Reticulocyte Hemoglobin: 22.8 pg — ABNORMAL LOW (ref >27.9)

## 2023-06-18 LAB — FERRITIN: Ferritin: 250 ng/mL (ref 11–307)

## 2023-06-18 NOTE — Progress Notes (Unsigned)
[facility] [phone]   [phone]  HEMATOLOGY AND ONCOLOGY PROGRESS NOTE  Patient Care Team: [name], NP as PCP - General (Pain Medicine) Pricilla Riffle, MD as PCP - Cardiology (Cardiology) Pricilla Riffle, MD as Consulting Physician (Cardiology)  Hematological/Oncological History #Microcytic Anemia #Anemia in Setting of CKD  -07/18/2020: Colonoscopy: one 5 mm polyp in descending colon. Diverticulosis in sigmoid colon and ascending colon.   -11/22/2020: WBC 4.5, Hgb 10.1 (L), MCV 69.6, Plt 235  -11/30/2020: Vitamin B12 186 (L), Retic Ct Pct 1.9%, Abs Retic 87,780 (H), Iron 62, Transferrin 219, iron saturation 20.2% (L), Ferritin 70.4, Folate 12.3. Started vitamin B12 2000 mcg per day.   -01/23/2021:EGD/EUS: Previous surgical intervention-query mini-gastric pouch found in the cardia. Submucosal nodule in the duodenum bulb removed via EMR. Pathology was consistent with polypoid dudoenal tissue showing nodular, mature adipose tissue within the submucosa, consistent with a lipoma. Negative for dysplasia or malignancy.  -03/29/2021: WBC 5.6, Hgb 9.2 (L), MCV 68.6 (L), Plt 251, Ferritin 56.6  -04/04/2021: Establish care with Georga Kaufmann PA-C  04/28/2021-05/26/2021: Received IV venofer x 5 doses.   HISTORY OF PRESENTING ILLNESS:  Alicia [name] 77 y.o. female returns for a follow up for microcytic anemia.  She was last seen on 12/14/2022.  In the interim since her last visit she has had no major changes in her health.  On exam today Alicia [name] reports her energy and appetite are stable. She is able to complete her baseline ADLs independently. She denies easy bruising or signs of bleeding. She denies fevers, chills, sweats, chest pain, cough, nausea, vomiting, diarrhea or constipation. She has no other complaints. Rest of the 10 point ROS is below.   MEDICAL HISTORY:  Past Medical History:  Diagnosis Date   Anemia    Arthritis    "right knee"  (10/21/2015)   CAD (coronary artery disease)    a. Cath 10/21/15: s/p DES to RCA: moderate diffuse stenosis of mod diagonal, diffuse irregularity LCx and LAD. 2D echo 10/21/15: mod LVH, EF 60-65%, no RWMA, calcified MV, mod-severe LAE. 03/18/17 PCI with DES--> 1 diag   Cataract    CKD (chronic kidney disease), stage IV (HCC)    Diabetes (HCC)    Essential hypertension    Family history of adverse reaction to anesthesia    "oldest sister, Malachi Bonds, related to brain formation at back of head; when they put her to sleep it's hard to wake her up"   GERD (gastroesophageal reflux disease)    History of blood transfusion    "related to ruptured tubal pregnancy"   Hyperlipidemia    Hypertensive heart disease    Morbid obesity (HCC)    Stroke (cerebrum) (HCC)    Tubular adenoma of colon    Type II diabetes mellitus (HCC)     SURGICAL HISTORY: Past Surgical History:  Procedure Laterality Date   ABDOMINAL HYSTERECTOMY  1988   fibroid   APPENDECTOMY  1985   BARIATRIC SURGERY  1984   BIOPSY  07/18/2020   Procedure: BIOPSY;  Surgeon: Beverley Fiedler, MD;  Location: [facility];  Service: Gastroenterology;;   CARDIAC CATHETERIZATION N/A 10/21/2015   Procedure: Right/Left Heart Cath and Coronary Angiography;  Surgeon: Tonny Bollman, MD;  Location: [facility] INVASIVE CV LAB;  Service: Cardiovascular;  Laterality: N/A;   CHOLECYSTECTOMY OPEN  1984   COLONOSCOPY WITH PROPOFOL N/A 07/18/2020   Procedure: COLONOSCOPY WITH PROPOFOL;  Surgeon: Beverley Fiedler, MD;  Location: [facility];  Service: Gastroenterology;  Laterality: N/A;   CORONARY PRESSURE/FFR STUDY N/A 09/04/2019   Procedure: INTRAVASCULAR PRESSURE WIRE/FFR STUDY;  Surgeon: Yvonne Kendall, MD;  Location: [facility] INVASIVE CV LAB;  Service: Cardiovascular;  Laterality: N/A;   CORONARY STENT INTERVENTION N/A 03/18/2017   Procedure: Coronary Stent Intervention;  Surgeon: Marykay Lex, MD;  Location: [facility] INVASIVE CV LAB;  Service: Cardiovascular;  Laterality:  N/A;   CORONARY STENT PLACEMENT  03/18/2017   A OPTIMIZE STUDY Drug Eluting Stent (2.5 mm 18 mm - post-dilated to 2.7 mm) was successfully placed   ECTOPIC PREGNANCY SURGERY     ENDOSCOPIC MUCOSAL RESECTION N/A 01/23/2021   Procedure: ENDOSCOPIC MUCOSAL RESECTION;  Surgeon: Lemar Lofty., MD;  Location: [facility];  Service: Gastroenterology;  Laterality: N/A;   ESOPHAGOGASTRODUODENOSCOPY (EGD) WITH PROPOFOL N/A 07/18/2020   Procedure: ESOPHAGOGASTRODUODENOSCOPY (EGD) WITH PROPOFOL;  Surgeon: Beverley Fiedler, MD;  Location: [facility];  Service: Gastroenterology;  Laterality: N/A;   ESOPHAGOGASTRODUODENOSCOPY (EGD) WITH PROPOFOL N/A 01/23/2021   Procedure: ESOPHAGOGASTRODUODENOSCOPY (EGD) WITH PROPOFOL;  Surgeon: Meridee Score Netty Starring., MD;  Location: [facility];  Service: Gastroenterology;  Laterality: N/A;   EUS N/A 01/23/2021   Procedure: UPPER ENDOSCOPIC ULTRASOUND (EUS) RADIAL;  Surgeon: Lemar Lofty., MD;  Location: [facility];  Service: Gastroenterology;  Laterality: N/A;   HEMOSTASIS CLIP PLACEMENT  01/23/2021   Procedure: HEMOSTASIS CLIP PLACEMENT;  Surgeon: Lemar Lofty., MD;  Location: [facility];  Service: Gastroenterology;;   LEFT HEART CATH AND CORONARY ANGIOGRAPHY N/A 03/18/2017   Procedure: Left Heart Cath and Coronary Angiography;  Surgeon: Marykay Lex, MD;  Location: [facility] INVASIVE CV LAB;  Service: Cardiovascular;  Laterality: N/A;   LEFT HEART CATH AND CORONARY ANGIOGRAPHY N/A 09/04/2019   Procedure: LEFT HEART CATH AND CORONARY ANGIOGRAPHY;  Surgeon: Yvonne Kendall, MD;  Location: [facility] INVASIVE CV LAB;  Service: Cardiovascular;  Laterality: N/A;   POLYPECTOMY  07/18/2020   Procedure: POLYPECTOMY;  Surgeon: Beverley Fiedler, MD;  Location: [facility];  Service: Gastroenterology;;   REDUCTION MAMMAPLASTY Bilateral ~ 1976   SUBMUCOSAL LIFTING INJECTION  01/23/2021   Procedure: SUBMUCOSAL LIFTING INJECTION;  Surgeon: Lemar Lofty., MD;   Location: [facility];  Service: Gastroenterology;;   TONSILLECTOMY      SOCIAL HISTORY: Social History   Socioeconomic History   Marital status: Divorced    Spouse name: Not on file   Number of children: 3   Years of education: doctorate   Highest education level: Not on file  Occupational History   Occupation: Teacher/retired  Tobacco Use   Smoking status: Never   Smokeless tobacco: Never  Vaping Use   Vaping status: Never Used  Substance and Sexual Activity   Alcohol use: No   Drug use: No   Sexual activity: Not Currently  Other Topics Concern   Not on file  Social History Narrative   Caffeine use-yes   Regular exercise-no   Social Determinants of Health   Financial Resource Strain: Low Risk  (11/07/2022)   Overall Financial Resource Strain (CARDIA)    Difficulty of Paying Living Expenses: Not hard at all  Food Insecurity: No Food Insecurity (11/07/2022)   Hunger Vital Sign    Worried About Running Out of Food in the Last Year: Never true    Ran Out of Food in the Last Year: Never true  Transportation Needs: No Transportation Needs (11/07/2022)   PRAPARE - Administrator, Civil Service (Medical): No    Lack of Transportation (Non-Medical): No  Physical Activity: Insufficiently Active (11/07/2022)   Exercise Vital Sign    Days of Exercise per Week: 7 days    Minutes of Exercise per Session: 20 min  Stress: No Stress Concern Present (11/07/2022)   [employer] of Occupational Health - Occupational Stress Questionnaire    Feeling of Stress : Not at all  Social Connections: Moderately Isolated (11/07/2022)   Social Connection and Isolation Panel [NHANES]    Frequency of Communication with Friends and Family: More than three times a week    Frequency of Social Gatherings with Friends and Family: More than three times a week    Attends Religious Services: More than 4 times per year    Active Member of [employer] or Organizations: No    Attends Tax inspector Meetings: Never    Marital Status: Divorced  Catering manager Violence: Not At Risk (11/07/2022)   Humiliation, Afraid, Rape, and Kick questionnaire    Fear of Current or Ex-Partner: No    Emotionally Abused: No    Physically Abused: No    Sexually Abused: No    FAMILY HISTORY: Family History  Problem Relation Age of Onset   Diabetes Mother    Heart attack Mother 3       2007   Hypertension Mother    Hyperlipidemia Mother    Heart disease Mother    Heart attack Father    Stroke Father 61   Hypertension Father    Hyperlipidemia Father    Cancer Sister        Gastric cancer in 2013   Stomach cancer Sister        in the cavity behind the stomach   Diabetes Sister    Diabetes Sister    Diabetes Sister    Migraines Daughter        brain surgery   Migraines Daughter    Hypertension Son        weight loss corrected that   Breast cancer Neg Hx    Colon cancer Neg Hx    Inflammatory bowel disease Neg Hx    Liver disease Neg Hx    Esophageal cancer Neg Hx    Pancreatic cancer Neg Hx    Rectal cancer Neg Hx     ALLERGIES:  is allergic to iodinated contrast media and sulfa antibiotics.  MEDICATIONS:  Current Outpatient Medications  Medication Sig Dispense Refill   amLODipine (NORVASC) 10 MG tablet Take 1 tablet (10 mg total) by mouth at bedtime. 90 tablet 3   aspirin EC 81 MG tablet Take 1 tablet (81 mg total) by mouth daily. Swallow whole. 30 tablet 11   atorvastatin (LIPITOR) 80 MG tablet TAKE 1 TABLET BY MOUTH DAILY AT 6 PM. 90 tablet 3   carvedilol (COREG) 25 MG tablet Take 1 tablet (25 mg total) by mouth 2 (two) times daily with a meal. 180 tablet 3   Cholecalciferol (VITAMIN D3) 50 MCG (2000 UT) TABS Take 2,000 Units by mouth daily.     clopidogrel (PLAVIX) 75 MG tablet TAKE 1 TABLET BY MOUTH EVERY DAY 90 tablet 1   doxylamine, Sleep, (UNISOM) 25 MG tablet Take 25 mg by mouth at bedtime as needed for sleep.     Dulaglutide (TRULICITY) 1.5 MG/0.5ML SOPN  Inject 1.5 mg into the skin once a week. 6 mL 1   Evolocumab (REPATHA SURECLICK) 140 MG/ML SOAJ INJECT 1 PEN INTO THE SKIN EVERY 14 (FOURTEEN) DAYS. 2 mL 5   FARXIGA 10 MG TABS tablet Take  10 mg by mouth daily.     ferrous sulfate 325 (65 FE) MG tablet TAKE 1 TABLET BY MOUTH EVERY DAY WITH BREAKFAST 90 tablet 0   furosemide (LASIX) 40 MG tablet Take 1 tablet (40 mg total) by mouth daily. 90 tablet 3   glucose blood (ONETOUCH ULTRA) test strip 1 each by Other route 2 (two) times daily. LAST REFILL SCHEDULE PHYSICAL 200 strip 0   isosorbide mononitrate (IMDUR) 60 MG 24 hr tablet TAKE 1 TABLET BY MOUTH EVERY DAY 90 tablet 1   Magnesium 200 MG TABS Take 200 mg by mouth daily.     nitroGLYCERIN (NITROSTAT) 0.4 MG SL tablet Place 1 tablet (0.4 mg total) under the tongue every 5 (five) minutes as needed for chest pain. 25 tablet 3   ondansetron (ZOFRAN-ODT) 4 MG disintegrating tablet TAKE 1 TABLET BY MOUTH EVERY 8 HOURS AS NEEDED FOR NAUSEA AND VOMITING 30 tablet 0   pantoprazole (PROTONIX) 40 MG tablet TAKE 1 TABLET BY MOUTH EVERY DAY 90 tablet 1   spironolactone (ALDACTONE) 25 MG tablet Take 1 tablet (25 mg total) by mouth daily. 90 tablet 3   vitamin B-12 (CYANOCOBALAMIN) 500 MCG tablet Take 2 tablets (1,000 mcg total) by mouth daily.     No current facility-administered medications for this visit.    REVIEW OF SYSTEMS:   Constitutional: ( - ) fevers, ( - )  chills , ( - ) night sweats Eyes: ( - ) blurriness of vision, ( - ) double vision, ( - ) watery eyes Ears, nose, mouth, throat, and face: ( - ) mucositis, ( - ) sore throat Respiratory: ( - ) cough, ( - ) dyspnea, ( - ) wheezes Cardiovascular: ( - ) palpitation, ( - ) chest discomfort, ( + ) lower extremity swelling Gastrointestinal:  ( - ) nausea, ( - ) heartburn, ( - ) change in bowel habits Skin: ( - ) abnormal skin rashes Lymphatics: ( - ) new lymphadenopathy, ( - ) easy bruising Neurological: ( - ) numbness, ( - ) tingling, ( - ) new  weaknesses Behavioral/Psych: ( - ) mood change, ( - ) new changes  All other systems were reviewed with the patient and are negative.  PHYSICAL EXAMINATION: ECOG PERFORMANCE STATUS: 1 - Symptomatic but completely ambulatory  Vitals:   06/18/23 1350  BP: 127/68  Pulse: 78  Resp: 18  Temp: 98.2 F (36.8 C)  SpO2: 100%      Filed Weights   06/18/23 1350  Weight: 283 lb 6.4 oz (128.5 kg)    GENERAL: well appearing African American female in NAD, obese SKIN: skin color, texture, turgor are normal, no rashes or significant lesions EYES: conjunctiva are pink and non-injected, sclera clear LUNGS: clear to auscultation and percussion with normal breathing effort HEART: regular rate & rhythm and no murmurs and no lower extremity edema Musculoskeletal: no cyanosis of digits and no clubbing  PSYCH: alert & oriented x 3, fluent speech NEURO: no focal motor/sensory deficits  LABORATORY DATA:  I have reviewed the data as listed    Latest Ref Rng & Units 06/18/2023    1:32 PM 06/05/2023   10:55 AM 04/24/2023   11:00 AM  CBC  WBC 4.0 - 10.5 K/uL 4.0     Hemoglobin 12.0 - 15.0 g/dL 9.7  16.1  09.6   Hematocrit 36.0 - 46.0 % 32.1     Platelets 150 - 400 K/uL 200          Latest Ref  Rng & Units 06/18/2023    1:32 PM 06/05/2023   10:51 AM 04/24/2023   10:59 AM  CMP  Glucose 70 - 99 mg/dL 295  621  308   BUN 8 - 23 mg/dL 37  32  37   Creatinine 0.44 - 1.00 mg/dL 6.57  8.46  9.62   Sodium 135 - 145 mmol/L 137  138  139   Potassium 3.5 - 5.1 mmol/L 5.3  4.1  4.3   Chloride 98 - 111 mmol/L 104  102  103   CO2 22 - 32 mmol/L 26  26  27    Calcium 8.9 - 10.3 mg/dL 8.6  8.7    8.9  8.8    8.8   Total Protein 6.5 - 8.1 g/dL 6.2     Total Bilirubin 0.3 - 1.2 mg/dL 0.6     Alkaline Phos 38 - 126 U/L 124     AST 15 - 41 U/L 12     ALT 0 - 44 U/L 9      ASSESSMENT & PLAN Alicia [name] is a 76 y.o. female presenting to the clinic for follow up of microcytic anemia.   #Microcytic  Anemia #Anemia in Setting of CKD  --Multifactorial in the setting of CKD, malabsorption of iron and B12 due to history of gastric bypass.  --Patient denies any signs of bleeding. She underwent colonoscopy in August 2021 and EGD in March 2022 without any evidence of active or recent bleeding.  --Last received IV venofer once weekly x 5 doses from 04/28/2021-05/26/2021. --Currently receives retacrit 20,000 units every 6 weeks through nephrology --Testing from 06/13/21 ruled out beta thalassemia or other hemoglobin variant.  PLAN: --Labs from today show stable anemia with Hgb 9.7, MCV 74.7. No evidence of iron or B12 deficiency.  --Awaiting alpha thalassemia testing due to evaluate persistent microcytosis.  --No indication for bone marrow biopsy at this time. --Continue retacrit injections with nephrology team  --RTC in 6 months with labs.   No orders of the defined types were placed in this encounter.   All questions were answered. The patient knows to call the clinic with any problems, questions or concerns.  I have spent a total of 30 minutes minutes of face-to-face and non-face-to-face time, preparing to see the patient, performing a medically appropriate examination, counseling and educating the patient, ordering tests, documenting clinical information in the electronic health record, and care coordination.   Georga Kaufmann PA-C Dept of Hematology and Oncology [facility] Phone: [phone]

## 2023-06-20 ENCOUNTER — Encounter (HOSPITAL_COMMUNITY): Payer: Self-pay

## 2023-06-20 ENCOUNTER — Encounter: Payer: Self-pay | Admitting: Physician Assistant

## 2023-07-06 ENCOUNTER — Other Ambulatory Visit: Payer: Self-pay | Admitting: Nurse Practitioner

## 2023-07-06 DIAGNOSIS — E119 Type 2 diabetes mellitus without complications: Secondary | ICD-10-CM

## 2023-07-06 DIAGNOSIS — K219 Gastro-esophageal reflux disease without esophagitis: Secondary | ICD-10-CM

## 2023-07-17 ENCOUNTER — Ambulatory Visit (HOSPITAL_COMMUNITY)
Admission: RE | Admit: 2023-07-17 | Discharge: 2023-07-17 | Disposition: A | Discharge status: Home / Self Care | Payer: Medicare PPO | Source: Ambulatory Visit | Attending: Nephrology | Admitting: Nephrology

## 2023-07-17 VITALS — BP 137/82 | HR 85 | Temp 97.2°F | Resp 17

## 2023-07-17 DIAGNOSIS — D631 Anemia in chronic kidney disease: Secondary | ICD-10-CM | POA: Diagnosis not present

## 2023-07-17 DIAGNOSIS — N184 Chronic kidney disease, stage 4 (severe): Secondary | ICD-10-CM | POA: Insufficient documentation

## 2023-07-17 DIAGNOSIS — D508 Other iron deficiency anemias: Secondary | ICD-10-CM | POA: Insufficient documentation

## 2023-07-17 DIAGNOSIS — D509 Iron deficiency anemia, unspecified: Secondary | ICD-10-CM | POA: Insufficient documentation

## 2023-07-17 LAB — FERRITIN: Ferritin: 302 ng/mL (ref 11–307)

## 2023-07-17 LAB — IRON AND TIBC
Iron: 59 ug/dL (ref 28–170)
Saturation Ratios: 22 % (ref 10.4–31.8)
TIBC: 267 ug/dL (ref 250–450)
UIBC: 208 ug/dL

## 2023-07-17 LAB — RENAL FUNCTION PANEL
Albumin: 3.8 g/dL (ref 3.5–5.0)
Anion gap: 9 (ref 5–15)
BUN: 26 mg/dL — ABNORMAL HIGH (ref 8–23)
CO2: 24 mmol/L (ref 22–32)
Calcium: 9.1 mg/dL (ref 8.9–10.3)
Chloride: 104 mmol/L (ref 98–111)
Creatinine, Ser: 2.23 mg/dL — ABNORMAL HIGH (ref 0.44–1.00)
GFR, Estimated: 22 mL/min — ABNORMAL LOW (ref >60)
Glucose, Bld: 126 mg/dL — ABNORMAL HIGH (ref 70–99)
Phosphorus: 4.2 mg/dL (ref 2.5–4.6)
Potassium: 4.8 mmol/L (ref 3.5–5.1)
Sodium: 137 mmol/L (ref 135–145)

## 2023-07-17 LAB — POCT HEMOGLOBIN-HEMACUE: Hemoglobin: 10 g/dL — ABNORMAL LOW (ref 12.0–15.0)

## 2023-07-17 MED ORDER — EPOETIN ALFA-EPBX 10000 UNIT/ML IJ SOLN
20000.0000 [IU] | INTRAMUSCULAR | Status: DC
  Administered 2023-07-17: 20000 [IU] via SUBCUTANEOUS

## 2023-07-17 MED ORDER — EPOETIN ALFA-EPBX 10000 UNIT/ML IJ SOLN
INTRAMUSCULAR | Status: AC
  Filled 2023-07-17: qty 2

## 2023-07-18 LAB — PTH, INTACT AND CALCIUM
Calcium, Total (PTH): 9 mg/dL (ref 8.7–10.3)
PTH: 77 pg/mL — ABNORMAL HIGH (ref 15–65)

## 2023-07-26 ENCOUNTER — Encounter: Payer: Self-pay | Admitting: Nurse Practitioner

## 2023-07-26 ENCOUNTER — Ambulatory Visit: Payer: Medicare PPO | Admitting: Nurse Practitioner

## 2023-07-26 ENCOUNTER — Encounter: Payer: Self-pay | Admitting: *Deleted

## 2023-07-26 VITALS — BP 110/60 | HR 63 | Temp 97.8°F | Ht 65.0 in

## 2023-07-26 DIAGNOSIS — L980 Pyogenic granuloma: Secondary | ICD-10-CM

## 2023-07-26 DIAGNOSIS — Z7984 Long term (current) use of oral hypoglycemic drugs: Secondary | ICD-10-CM | POA: Diagnosis not present

## 2023-07-26 DIAGNOSIS — M25562 Pain in left knee: Secondary | ICD-10-CM | POA: Diagnosis not present

## 2023-07-26 DIAGNOSIS — Z7985 Long-term (current) use of injectable non-insulin antidiabetic drugs: Secondary | ICD-10-CM | POA: Diagnosis not present

## 2023-07-26 DIAGNOSIS — E119 Type 2 diabetes mellitus without complications: Secondary | ICD-10-CM

## 2023-07-26 DIAGNOSIS — R1310 Dysphagia, unspecified: Secondary | ICD-10-CM | POA: Diagnosis not present

## 2023-07-26 DIAGNOSIS — M25561 Pain in right knee: Secondary | ICD-10-CM | POA: Diagnosis not present

## 2023-07-26 DIAGNOSIS — G8929 Other chronic pain: Secondary | ICD-10-CM | POA: Diagnosis not present

## 2023-07-26 LAB — POCT GLYCOSYLATED HEMOGLOBIN (HGB A1C): Hemoglobin A1C: 9 % — AB (ref 4.0–5.6)

## 2023-07-26 NOTE — Progress Notes (Signed)
Established Patient Office Visit  Subjective   Patient ID: Alicia [name], female    DOB: September 24, 1946  Age: 77 y.o. MRN: [id number]  Chief Complaint  Patient presents with   Referral    Pt complains of raised red bump on lip that started 2 weeks ago. Hurts to touch. Requests a referral to Dermatology. Prefers [place] location.     HPI  DM2: patinet currently maintained on truliciyt 1.5mg  once a week. She is checking glucose twice daily at home. States that the highest 110 Low end 78  Patient states she did have to go several weeks without Trulicity due to a stock issue.  States this is her first week back on but was able to get a 61-month supply this time.  Patient is also on Farxiga through her kidney specialist  States that she will take she will take something 3-4 days. States this is her base line  Skin: states that it started approx 2 weeks ago. States that she thought it was a fever blister. States used campho phenique and abreva without relief and the lesion has grown. States that it does bleed.  Some pain at the base with palpation. No injury that she knows of   Trouble swallowing: states that it is intermittent. States that it is once a day. States she can get it to clear may have to massage her trhoat. No vomitng. Mainly solids.  Patient is not a big red meat eater she does fish and chicken.    Review of Systems  Constitutional:  Negative for chills and fever.  Respiratory:  Negative for shortness of breath.   Cardiovascular:  Negative for chest pain.  Gastrointestinal:  Positive for constipation. Negative for abdominal pain, diarrhea, nausea and vomiting.  Neurological:  Negative for headaches.  Psychiatric/Behavioral:  Negative for hallucinations and suicidal ideas.       Objective:     BP 110/60   Pulse 63   Temp 97.8 F (36.6 C) (Temporal)   Ht 5\' 5"  (1.651 m)   SpO2 98%   BMI 47.16 kg/m  BP Readings from Last 3 Encounters:  07/26/23 110/60  07/17/23  137/82  06/18/23 127/68   Wt Readings from Last 3 Encounters:  06/18/23 283 lb 6.4 oz (128.5 kg)  05/15/23 275 lb 6.4 oz (124.9 kg)  04/16/23 280 lb 4 oz (127.1 kg)   SpO2 Readings from Last 3 Encounters:  07/26/23 98%  07/17/23 100%  06/18/23 100%      Physical Exam   Results for orders placed or performed in visit on 07/26/23  POCT glycosylated hemoglobin (Hb A1C)  Result Value Ref Range   Hemoglobin A1C 9.0 (A) 4.0 - 5.6 %   HbA1c POC (<> result, manual entry)     HbA1c, POC (prediabetic range)     HbA1c, POC (controlled diabetic range)        The ASCVD Risk score (Arnett DK, et al., 2019) failed to calculate for the following reasons:   The patient has a prior MI or stroke diagnosis    Assessment & Plan:   Problem List Items Addressed This Visit       Digestive   Dysphagia    Ambiguous in nature.  Patient is unsure if it is all solids or certain solids like meats versus bread.  She will pay more attention.  Follow-up if worsening or not improving consider GI referral      Pyogenic granuloma of lip    Pyogenic granuloma of lip.  Ambulatory referral to dermatology see clinical photo      Relevant Orders   Ambulatory referral to Dermatology     Endocrine   Type 2 diabetes mellitus without complication, without long-term current use of insulin (HCC) - Primary    Currently on Trulicity 1.5 mg and Farxiga 10 mg.  A1c 9.0 today.  Patient had had weeks without having her injectable medication keep medications the same as patient has had labile sugars in the past with hypoglycemia requiring hospitalization.      Relevant Orders   POCT glycosylated hemoglobin (Hb A1C) (Completed)     Other   Chronic pain of both knees    History of the same.  She can follow-up with sports medicine physician Dr. Karleen Hampshire Copland in office discussed further management inclusive of injections.  Patient was given information to schedule at discharge       Return in about 3 months  (around 10/25/2023) for DM recheck.    Audria Nine, NP

## 2023-07-26 NOTE — Assessment & Plan Note (Signed)
Pyogenic granuloma of lip.  Ambulatory referral to dermatology see clinical photo

## 2023-07-26 NOTE — Assessment & Plan Note (Signed)
Currently on Trulicity 1.5 mg and Farxiga 10 mg.  A1c 9.0 today.  Patient had had weeks without having her injectable medication keep medications the same as patient has had labile sugars in the past with hypoglycemia requiring hospitalization.

## 2023-07-26 NOTE — Assessment & Plan Note (Signed)
Ambiguous in nature.  Patient is unsure if it is all solids or certain solids like meats versus bread.  She will pay more attention.  Follow-up if worsening or not improving consider GI referral

## 2023-07-26 NOTE — Assessment & Plan Note (Signed)
History of the same.  She can follow-up with sports medicine physician Dr. Karleen Hampshire Copland in office discussed further management inclusive of injections.  Patient was given information to schedule at discharge

## 2023-07-26 NOTE — Patient Instructions (Signed)
Nice to see you today Your A1C is 9.0% today. I am not going to change your medications  You can schedule with Dr. Karleen Hampshire Copland here in office to discuss injections in the knees  Follow up with me in 3 months, sooner if you need me

## 2023-07-29 NOTE — Telephone Encounter (Signed)
Pt called requesting to speak to Amy. Call back # [phone]

## 2023-07-29 NOTE — Telephone Encounter (Signed)
Patient has not called Dermatology [facility] yet--will do that first  However, if she cannot get in at [facility] soon enough, patient would like to try the location below:  Dermatology specialist in [place] [phone] (phone) [phone] (fax)

## 2023-08-01 DIAGNOSIS — R208 Other disturbances of skin sensation: Secondary | ICD-10-CM | POA: Diagnosis not present

## 2023-08-01 DIAGNOSIS — L980 Pyogenic granuloma: Secondary | ICD-10-CM | POA: Diagnosis not present

## 2023-08-01 DIAGNOSIS — R58 Hemorrhage, not elsewhere classified: Secondary | ICD-10-CM | POA: Diagnosis not present

## 2023-08-07 ENCOUNTER — Other Ambulatory Visit: Payer: Self-pay | Admitting: Nurse Practitioner

## 2023-08-07 ENCOUNTER — Other Ambulatory Visit: Payer: Self-pay | Admitting: Internal Medicine

## 2023-08-07 DIAGNOSIS — K219 Gastro-esophageal reflux disease without esophagitis: Secondary | ICD-10-CM

## 2023-08-11 NOTE — Progress Notes (Unsigned)
Storie Heffern T. Mekhia Brogan, MD, CAQ Sports Medicine [facility] [address] [place], [address]  Phone: [phone]  FAX: [phone]  Alicia [name] - 77 y.o. female  MRN [id number]  Date of Birth: 1946/09/09  Date: 08/12/2023  PCP: [name], NP  Referral: [name], NP  No chief complaint on file.  Subjective:   Alicia [name] is a 77 y.o. very pleasant female patient with There is no height or weight on file to calculate BMI. who presents with the following:  The patient presents with ongoing left-sided knee pain.  She does have some significant moderate to severe osteoarthritis on plain film seen on June 2024 x-rays.  She presents today for ongoing discussion and recommendations for management.    Review of Systems is noted in the HPI, as appropriate  Objective:   There were no vitals taken for this visit.  GEN: No acute distress; alert,appropriate. PULM: Breathing comfortably in no respiratory distress PSYCH: Normally interactive.   Laboratory and Imaging Data:  CLINICAL DATA:  Knee pain for 2 weeks.  No known injury.   EXAM: LEFT KNEE - COMPLETE 4+ VIEW   COMPARISON:  Left knee radiographs 08/22/2009   FINDINGS: There is diffuse decreased bone mineralization. Moderate medial compartment joint space narrowing breast mild high-grade peripheral medial component degenerative osteophytes. Mild lateral compartment joint space narrowing and moderate peripheral lateral osteophytes.   Severe patellofemoral joint space narrowing with moderate peripheral osteophytosis.   No joint effusion. Mild chronic enthesopathic change at the quadriceps insertion on the patella.   No acute fracture or dislocation. Moderate atherosclerotic calcifications.   IMPRESSION: Severe patellofemoral and moderate medial compartment osteoarthritis.     Electronically Signed   By: Neita Garnet M.D.   On: 04/20/2023 11:49 Assessment  and Plan:   ***

## 2023-08-12 ENCOUNTER — Encounter: Payer: Self-pay | Admitting: Family Medicine

## 2023-08-12 ENCOUNTER — Ambulatory Visit: Payer: Medicare PPO | Admitting: Family Medicine

## 2023-08-12 VITALS — BP 120/68 | HR 67 | Temp 98.2°F | Ht 65.0 in

## 2023-08-12 DIAGNOSIS — Z23 Encounter for immunization: Secondary | ICD-10-CM

## 2023-08-12 DIAGNOSIS — E119 Type 2 diabetes mellitus without complications: Secondary | ICD-10-CM

## 2023-08-12 DIAGNOSIS — M1712 Unilateral primary osteoarthritis, left knee: Secondary | ICD-10-CM | POA: Diagnosis not present

## 2023-08-12 MED ORDER — TRIAMCINOLONE ACETONIDE 40 MG/ML IJ SUSP
40.0000 mg | Freq: Once | INTRAMUSCULAR | Status: AC
Start: 2023-08-12 — End: 2023-08-12
  Administered 2023-08-12: 40 mg via INTRA_ARTICULAR

## 2023-08-12 NOTE — Patient Instructions (Signed)
Voltaren 1% gel, over the counter ?You can apply up to 4 times a day ? ?This can be applied to any joint: knee, wrist, fingers, elbows, shoulders, feet and ankles. ?Can apply to any tendon: tennis elbow, achilles, tendon, rotator cuff or any other tendon. ? ?Minimal is absorbed in the bloodstream: ok with oral anti-inflammatory or a blood thinner. ? ?Cost is about 9 dollars  ?

## 2023-08-28 ENCOUNTER — Ambulatory Visit (HOSPITAL_COMMUNITY)
Admission: RE | Admit: 2023-08-28 | Discharge: 2023-08-28 | Disposition: A | Discharge status: Home / Self Care | Payer: Medicare PPO | Source: Ambulatory Visit | Attending: Nephrology | Admitting: Nephrology

## 2023-08-28 VITALS — BP 145/81 | HR 72 | Temp 97.3°F | Resp 16

## 2023-08-28 DIAGNOSIS — D631 Anemia in chronic kidney disease: Secondary | ICD-10-CM | POA: Diagnosis not present

## 2023-08-28 DIAGNOSIS — D508 Other iron deficiency anemias: Secondary | ICD-10-CM

## 2023-08-28 DIAGNOSIS — N184 Chronic kidney disease, stage 4 (severe): Secondary | ICD-10-CM | POA: Diagnosis not present

## 2023-08-28 DIAGNOSIS — D509 Iron deficiency anemia, unspecified: Secondary | ICD-10-CM

## 2023-08-28 LAB — FERRITIN: Ferritin: 232 ng/mL (ref 11–307)

## 2023-08-28 LAB — IRON AND TIBC
Iron: 49 ug/dL (ref 28–170)
Saturation Ratios: 18 % (ref 10.4–31.8)
TIBC: 269 ug/dL (ref 250–450)
UIBC: 220 ug/dL

## 2023-08-28 LAB — RENAL FUNCTION PANEL
Albumin: 3.6 g/dL (ref 3.5–5.0)
Anion gap: 9 (ref 5–15)
BUN: 32 mg/dL — ABNORMAL HIGH (ref 8–23)
CO2: 24 mmol/L (ref 22–32)
Calcium: 8.7 mg/dL — ABNORMAL LOW (ref 8.9–10.3)
Chloride: 108 mmol/L (ref 98–111)
Creatinine, Ser: 2.58 mg/dL — ABNORMAL HIGH (ref 0.44–1.00)
GFR, Estimated: 19 mL/min — ABNORMAL LOW (ref >60)
Glucose, Bld: 127 mg/dL — ABNORMAL HIGH (ref 70–99)
Phosphorus: 3.5 mg/dL (ref 2.5–4.6)
Potassium: 4.7 mmol/L (ref 3.5–5.1)
Sodium: 141 mmol/L (ref 135–145)

## 2023-08-28 MED ORDER — EPOETIN ALFA-EPBX 10000 UNIT/ML IJ SOLN
INTRAMUSCULAR | Status: AC
  Filled 2023-08-28: qty 2

## 2023-08-28 MED ORDER — EPOETIN ALFA-EPBX 10000 UNIT/ML IJ SOLN
20000.0000 [IU] | INTRAMUSCULAR | Status: DC
  Administered 2023-08-28: 20000 [IU] via SUBCUTANEOUS

## 2023-08-29 LAB — POCT HEMOGLOBIN-HEMACUE: Hemoglobin: 9.9 g/dL — ABNORMAL LOW (ref 12.0–15.0)

## 2023-08-30 LAB — PTH, INTACT AND CALCIUM
Calcium, Total (PTH): 8.7 mg/dL (ref 8.7–10.3)
PTH: 98 pg/mL — ABNORMAL HIGH (ref 15–65)

## 2023-09-03 ENCOUNTER — Other Ambulatory Visit: Payer: Self-pay | Admitting: Nurse Practitioner

## 2023-09-03 DIAGNOSIS — K219 Gastro-esophageal reflux disease without esophagitis: Secondary | ICD-10-CM

## 2023-09-30 ENCOUNTER — Other Ambulatory Visit: Payer: Self-pay | Admitting: Nurse Practitioner

## 2023-09-30 DIAGNOSIS — K219 Gastro-esophageal reflux disease without esophagitis: Secondary | ICD-10-CM

## 2023-09-30 DIAGNOSIS — E119 Type 2 diabetes mellitus without complications: Secondary | ICD-10-CM

## 2023-10-07 ENCOUNTER — Other Ambulatory Visit (HOSPITAL_COMMUNITY): Payer: Self-pay | Admitting: *Deleted

## 2023-10-09 ENCOUNTER — Ambulatory Visit (HOSPITAL_COMMUNITY)
Admission: RE | Admit: 2023-10-09 | Discharge: 2023-10-09 | Disposition: A | Discharge status: Home / Self Care | Payer: Medicare PPO | Source: Ambulatory Visit | Attending: Nephrology | Admitting: Nephrology

## 2023-10-09 VITALS — BP 150/90 | HR 86 | Temp 97.1°F | Resp 16

## 2023-10-09 DIAGNOSIS — D509 Iron deficiency anemia, unspecified: Secondary | ICD-10-CM | POA: Diagnosis present

## 2023-10-09 DIAGNOSIS — D508 Other iron deficiency anemias: Secondary | ICD-10-CM | POA: Insufficient documentation

## 2023-10-09 DIAGNOSIS — Z79890 Hormone replacement therapy (postmenopausal): Secondary | ICD-10-CM | POA: Diagnosis not present

## 2023-10-09 DIAGNOSIS — N184 Chronic kidney disease, stage 4 (severe): Secondary | ICD-10-CM | POA: Diagnosis not present

## 2023-10-09 DIAGNOSIS — D631 Anemia in chronic kidney disease: Secondary | ICD-10-CM | POA: Diagnosis not present

## 2023-10-09 LAB — RENAL FUNCTION PANEL
Albumin: 3.6 g/dL (ref 3.5–5.0)
Anion gap: 8 (ref 5–15)
BUN: 31 mg/dL — ABNORMAL HIGH (ref 8–23)
CO2: 24 mmol/L (ref 22–32)
Calcium: 8.8 mg/dL — ABNORMAL LOW (ref 8.9–10.3)
Chloride: 107 mmol/L (ref 98–111)
Creatinine, Ser: 2.29 mg/dL — ABNORMAL HIGH (ref 0.44–1.00)
GFR, Estimated: 22 mL/min — ABNORMAL LOW (ref >60)
Glucose, Bld: 136 mg/dL — ABNORMAL HIGH (ref 70–99)
Phosphorus: 4.2 mg/dL (ref 2.5–4.6)
Potassium: 4.5 mmol/L (ref 3.5–5.1)
Sodium: 139 mmol/L (ref 135–145)

## 2023-10-09 LAB — IRON AND TIBC
Iron: 55 ug/dL (ref 28–170)
Saturation Ratios: 20 % (ref 10.4–31.8)
TIBC: 277 ug/dL (ref 250–450)
UIBC: 222 ug/dL

## 2023-10-09 LAB — POCT HEMOGLOBIN-HEMACUE: Hemoglobin: 10.5 g/dL — ABNORMAL LOW (ref 12.0–15.0)

## 2023-10-09 LAB — FERRITIN: Ferritin: 215 ng/mL (ref 11–307)

## 2023-10-09 MED ORDER — EPOETIN ALFA-EPBX 10000 UNIT/ML IJ SOLN
INTRAMUSCULAR | Status: AC
  Filled 2023-10-09: qty 2

## 2023-10-09 MED ORDER — EPOETIN ALFA-EPBX 10000 UNIT/ML IJ SOLN
20000.0000 [IU] | INTRAMUSCULAR | Status: DC
  Administered 2023-10-09: 20000 [IU] via SUBCUTANEOUS

## 2023-10-10 LAB — PTH, INTACT AND CALCIUM
Calcium, Total (PTH): 8.7 mg/dL (ref 8.7–10.3)
PTH: 84 pg/mL — ABNORMAL HIGH (ref 15–65)

## 2023-10-15 DIAGNOSIS — E1122 Type 2 diabetes mellitus with diabetic chronic kidney disease: Secondary | ICD-10-CM | POA: Diagnosis not present

## 2023-10-15 DIAGNOSIS — E785 Hyperlipidemia, unspecified: Secondary | ICD-10-CM | POA: Diagnosis not present

## 2023-10-15 DIAGNOSIS — I129 Hypertensive chronic kidney disease with stage 1 through stage 4 chronic kidney disease, or unspecified chronic kidney disease: Secondary | ICD-10-CM | POA: Diagnosis not present

## 2023-10-15 DIAGNOSIS — N179 Acute kidney failure, unspecified: Secondary | ICD-10-CM | POA: Diagnosis not present

## 2023-10-15 DIAGNOSIS — I2510 Atherosclerotic heart disease of native coronary artery without angina pectoris: Secondary | ICD-10-CM | POA: Diagnosis not present

## 2023-10-15 DIAGNOSIS — N184 Chronic kidney disease, stage 4 (severe): Secondary | ICD-10-CM | POA: Diagnosis not present

## 2023-10-15 DIAGNOSIS — D631 Anemia in chronic kidney disease: Secondary | ICD-10-CM | POA: Diagnosis not present

## 2023-10-23 ENCOUNTER — Encounter (HOSPITAL_COMMUNITY)
Admission: RE | Admit: 2023-10-23 | Discharge: 2023-10-23 | Disposition: A | Discharge status: Home / Self Care | Payer: Medicare PPO | Source: Ambulatory Visit | Attending: Nephrology | Admitting: Nephrology

## 2023-10-23 DIAGNOSIS — N189 Chronic kidney disease, unspecified: Secondary | ICD-10-CM | POA: Insufficient documentation

## 2023-10-23 DIAGNOSIS — D631 Anemia in chronic kidney disease: Secondary | ICD-10-CM | POA: Diagnosis not present

## 2023-10-23 MED ORDER — SODIUM CHLORIDE 0.9 % IV SOLN
510.0000 mg | INTRAVENOUS | Status: DC
  Administered 2023-10-23: 510 mg via INTRAVENOUS
  Filled 2023-10-23: qty 510

## 2023-10-25 ENCOUNTER — Ambulatory Visit: Payer: Medicare PPO | Admitting: Nurse Practitioner

## 2023-10-26 NOTE — Progress Notes (Unsigned)
Cardiology Office Note   Date:  10/26/2023   ID:  Alicia [name], Alicia [name] 06-Feb-1946, MRN [id number]  PCP:  [name], NP  Cardiologist:   Dietrich Pates, MD    F/U of CAD     History of Present Illness: Alicia [name] is a 77 y.o. female with a history of CAD status post DES to the RCA 10/2015.  Repeat cath 03/18/2017 for chest pain with DES to diagonal 1, 45% mid LAD treated with POBA, prior RCA stent was patent with a normal LVEF.   Pt also has a history of hypertension, hyperlipidemia, DM 2 and syncope (CT MRI and carotid Dopplers were all normal).  Echocardiogram at that time showed normal LVEF at 65 to 70%.  A 30-day monitor showed normal sinus rhythm with no arrhythmias in which the patient was felt to be dehydrated as her syncopal etiology.   Early 01/2019 patient suffered a right brain CVA treated with TPA.  Echocardiogram with normal LVEF and no source of embolus.  LDL was elevated at 139.  She was continued on Plavix and ASA therapies.   In October she had some chest pain. Cardiac cath performed 09/04/2019 which revealed moderate, nonobstructive coronary artery disease.  Most severe lesions were noted to be 50 to 60% in the mid LAD and 50% ostial D1 which were not thought to be hemodynamically significant by DFR.  Her stents were patent in D1 and proximal RCA.  Plan was to optimize medical therapy.  Her carvedilol was increased to 50 mg twice daily.   I saw the pt in jan 2022   Since then she has been seen by B Bhagat.  Complained of increasing dyspnea  Set up for myoview which was normal and echo which was normal   Also seen by Wende Mott     Since seen she has been doing OK   She had to resched appt with me since her husband died      She denies CP   I saw the pt in Jun 2024   Allergies:   Iodinated contrast media and Sulfa antibiotics   Past Medical History:  Diagnosis Date   Anemia    Arthritis    "right knee" (10/21/2015)   CAD (coronary artery disease)    a. Cath  10/21/15: s/p DES to RCA: moderate diffuse stenosis of mod diagonal, diffuse irregularity LCx and LAD. 2D echo 10/21/15: mod LVH, EF 60-65%, no RWMA, calcified MV, mod-severe LAE. 03/18/17 PCI with DES--> 1 diag   Cataract    CKD (chronic kidney disease), stage IV (HCC)    Diabetes (HCC)    Essential hypertension    Family history of adverse reaction to anesthesia    "oldest sister, Malachi Bonds, related to brain formation at back of head; when they put her to sleep it's hard to wake her up"   GERD (gastroesophageal reflux disease)    History of blood transfusion    "related to ruptured tubal pregnancy"   Hyperlipidemia    Hypertensive heart disease    Morbid obesity (HCC)    Stroke (cerebrum) (HCC)    Tubular adenoma of colon    Type II diabetes mellitus (HCC)     Past Surgical History:  Procedure Laterality Date   ABDOMINAL HYSTERECTOMY  1988   fibroid   APPENDECTOMY  1985   BARIATRIC SURGERY  1984   BIOPSY  07/18/2020   Procedure: BIOPSY;  Surgeon: Beverley Fiedler, MD;  Location: [facility];  Service:  Gastroenterology;;   CARDIAC CATHETERIZATION N/A 10/21/2015   Procedure: Right/Left Heart Cath and Coronary Angiography;  Surgeon: Tonny Bollman, MD;  Location: [facility] INVASIVE CV LAB;  Service: Cardiovascular;  Laterality: N/A;   CHOLECYSTECTOMY OPEN  1984   COLONOSCOPY WITH PROPOFOL N/A 07/18/2020   Procedure: COLONOSCOPY WITH PROPOFOL;  Surgeon: Beverley Fiedler, MD;  Location: [facility];  Service: Gastroenterology;  Laterality: N/A;   CORONARY PRESSURE/FFR STUDY N/A 09/04/2019   Procedure: INTRAVASCULAR PRESSURE WIRE/FFR STUDY;  Surgeon: Yvonne Kendall, MD;  Location: [facility] INVASIVE CV LAB;  Service: Cardiovascular;  Laterality: N/A;   CORONARY STENT INTERVENTION N/A 03/18/2017   Procedure: Coronary Stent Intervention;  Surgeon: Marykay Lex, MD;  Location: [facility] INVASIVE CV LAB;  Service: Cardiovascular;  Laterality: N/A;   CORONARY STENT PLACEMENT  03/18/2017   A OPTIMIZE STUDY Drug Eluting  Stent (2.5 mm 18 mm - post-dilated to 2.7 mm) was successfully placed   ECTOPIC PREGNANCY SURGERY     ENDOSCOPIC MUCOSAL RESECTION N/A 01/23/2021   Procedure: ENDOSCOPIC MUCOSAL RESECTION;  Surgeon: Lemar Lofty., MD;  Location: [facility];  Service: Gastroenterology;  Laterality: N/A;   ESOPHAGOGASTRODUODENOSCOPY (EGD) WITH PROPOFOL N/A 07/18/2020   Procedure: ESOPHAGOGASTRODUODENOSCOPY (EGD) WITH PROPOFOL;  Surgeon: Beverley Fiedler, MD;  Location: [facility];  Service: Gastroenterology;  Laterality: N/A;   ESOPHAGOGASTRODUODENOSCOPY (EGD) WITH PROPOFOL N/A 01/23/2021   Procedure: ESOPHAGOGASTRODUODENOSCOPY (EGD) WITH PROPOFOL;  Surgeon: Meridee Score Netty Starring., MD;  Location: [facility];  Service: Gastroenterology;  Laterality: N/A;   EUS N/A 01/23/2021   Procedure: UPPER ENDOSCOPIC ULTRASOUND (EUS) RADIAL;  Surgeon: Lemar Lofty., MD;  Location: [facility];  Service: Gastroenterology;  Laterality: N/A;   HEMOSTASIS CLIP PLACEMENT  01/23/2021   Procedure: HEMOSTASIS CLIP PLACEMENT;  Surgeon: Lemar Lofty., MD;  Location: [facility];  Service: Gastroenterology;;   LEFT HEART CATH AND CORONARY ANGIOGRAPHY N/A 03/18/2017   Procedure: Left Heart Cath and Coronary Angiography;  Surgeon: Marykay Lex, MD;  Location: [facility] INVASIVE CV LAB;  Service: Cardiovascular;  Laterality: N/A;   LEFT HEART CATH AND CORONARY ANGIOGRAPHY N/A 09/04/2019   Procedure: LEFT HEART CATH AND CORONARY ANGIOGRAPHY;  Surgeon: Yvonne Kendall, MD;  Location: [facility] INVASIVE CV LAB;  Service: Cardiovascular;  Laterality: N/A;   POLYPECTOMY  07/18/2020   Procedure: POLYPECTOMY;  Surgeon: Beverley Fiedler, MD;  Location: [facility];  Service: Gastroenterology;;   REDUCTION MAMMAPLASTY Bilateral ~ 1976   SUBMUCOSAL LIFTING INJECTION  01/23/2021   Procedure: SUBMUCOSAL LIFTING INJECTION;  Surgeon: Lemar Lofty., MD;  Location: [facility];  Service: Gastroenterology;;   TONSILLECTOMY        Social History:  The patient  reports that she has never smoked. She has never used smokeless tobacco. She reports that she does not drink alcohol and does not use drugs.   Family History:  The patient's family history includes Cancer in her sister; Diabetes in her mother, sister, sister, and sister; Heart attack in her father; Heart attack (age of onset: 75) in her mother; Heart disease in her mother; Hyperlipidemia in her father and mother; Hypertension in her father, mother, and son; Migraines in her daughter and daughter; Stomach cancer in her sister; Stroke (age of onset: 65) in her father.    ROS:  Please see the history of present illness. All other systems are reviewed and  Negative to the above problem except as noted.    PHYSICAL EXAM: VS:  There were no vitals taken for this visit.  ZOX:WRUEAVWU obese 77 yo  in no acute distress  HEENT: normal  Neck: no obvious JVD,  no carotid bruit Cardiac: RRR; no murmurs.  Tr LE  edema  Respiratory:  clear to auscultation GI: soft, nontender,  No hepatomegaly    EKG:  EKG is not  ordered today.  Prior CV Studies: GATED SPECT MYO PERF W/LEXISCAN STRESS 1D 07/25/2022 Normal perfusion. LVEF 66% with normal wall motion. This is a low risk study. No change compared to a prior study in 2006.   ECHO COMPLETE WO IMAGING ENHANCING AGENT 07/24/2022 EF 60-65, no RWMA, mild asymmetric LVH, GLS -19.7, normal RVSF, normal PASP (RVSP 26.7), mild to moderate LAE, mild MR, RA pressure 8   LEFT HEART CATH 09/04/2019 Conclusions: 1. Moderate, non-obstructive coronary artery disease.  Most severe lesions are 50-60% mid LAD and 50% ostial D1 stenoses, which are not hemodynamically significant by DFR. 2. Widely patent D1 and proximal RCA stents. 3. Normal left ventricular filling pressure.   CARDIAC TELEMETRY MONITORING-INTERPRETATION ONLY 07/31/2017 Poor quality transmission on a few strips Sinus rhythm.  No arrhythmias detected    Lipid Panel     Component Value Date/Time   CHOL 108 11/29/2022 1428   CHOL 94 (L) 12/12/2020 1035   TRIG 80.0 11/29/2022 1428   HDL 45.40 11/29/2022 1428   HDL 41 12/12/2020 1035   CHOLHDL 2 11/29/2022 1428   VLDL 16.0 11/29/2022 1428   LDLCALC 46 11/29/2022 1428   LDLCALC 41 12/12/2020 1035      Wt Readings from Last 3 Encounters:  10/23/23 277 lb (125.6 kg)  06/18/23 283 lb 6.4 oz (128.5 kg)  05/15/23 275 lb 6.4 oz (124.9 kg)      ASSESSMENT AND PLAN:  1  CAD   Last cath in 2020 patent stents Moderate Dz   Myoview in Sept 2023 showed normal perfusion    Follow      2  HTN  Controlled on current regimen  3.  HL   LDL 46  HDL 45  Trig 39     4  Hx CVA    Keep on ASA and Plavix    5  Renal  Last Cr 2.44  Follows with renal    F/U in Dec / Jan of this winter   Current medicines are reviewed at length with the patient today.  The patient does not have concerns regarding medicines.  Signed, Dietrich Pates, MD  10/26/2023 11:01 PM    [facility] [address] [place], [place], [place]  [address] Phone: [phone]; Fax: [phone]

## 2023-10-29 ENCOUNTER — Ambulatory Visit: Payer: Medicare PPO | Admitting: Internal Medicine

## 2023-10-30 ENCOUNTER — Encounter (HOSPITAL_COMMUNITY)
Admission: RE | Admit: 2023-10-30 | Discharge: 2023-10-30 | Disposition: A | Discharge status: Home / Self Care | Payer: Medicare PPO | Source: Ambulatory Visit | Attending: Nephrology | Admitting: Nephrology

## 2023-10-30 DIAGNOSIS — D631 Anemia in chronic kidney disease: Secondary | ICD-10-CM | POA: Diagnosis not present

## 2023-10-30 DIAGNOSIS — N189 Chronic kidney disease, unspecified: Secondary | ICD-10-CM | POA: Diagnosis not present

## 2023-10-30 MED ORDER — SODIUM CHLORIDE 0.9 % IV SOLN
510.0000 mg | INTRAVENOUS | Status: DC
  Administered 2023-10-30: 510 mg via INTRAVENOUS
  Filled 2023-10-30: qty 510

## 2023-10-31 ENCOUNTER — Encounter: Payer: Self-pay | Admitting: Family Medicine

## 2023-10-31 ENCOUNTER — Ambulatory Visit: Payer: Medicare PPO | Admitting: Nurse Practitioner

## 2023-10-31 ENCOUNTER — Ambulatory Visit: Payer: Medicare PPO | Admitting: Family Medicine

## 2023-10-31 VITALS — BP 132/70 | HR 65 | Temp 98.6°F | Ht 65.0 in

## 2023-10-31 DIAGNOSIS — M1712 Unilateral primary osteoarthritis, left knee: Secondary | ICD-10-CM

## 2023-10-31 MED ORDER — TRIAMCINOLONE ACETONIDE 40 MG/ML IJ SUSP
40.0000 mg | Freq: Once | INTRAMUSCULAR | Status: AC
Start: 2023-10-31 — End: 2023-10-31
  Administered 2023-10-31: 40 mg via INTRA_ARTICULAR

## 2023-10-31 NOTE — Progress Notes (Signed)
Alicia Homann T. Giovannina Mun, MD, CAQ [name] at [facility] [address] [place], [address]  Phone: [phone]  FAX: [phone]  Alicia [name] - 77 y.o. female  MRN [id number]  Date of Birth: December 22, 1945  Date: 10/31/2023  PCP: [name], NP  Referral: [name], NP  Chief Complaint  Patient presents with   Knee Pain    Left   Subjective:   Alicia [name] is a 77 y.o. very pleasant female patient with Body mass index is 46.1 kg/m. who presents with the following:  Ongoing L knee pain: She is a very nice older lady who I remember having seen her in September.  At that point I did do an intra-articular knee injection and she got a lot of relief.  She felt really pretty good until a couple of weeks ago.  She did feel a pop in her knee about 2 weeks ago and has since had some pain.  She does have very significant osteoarthritis on her plain film.  She has end-stage degenerative joint disease in the patellofemoral joint and moderate arthritic changes overall.  Normally uses a walker at home.  She is in a wheelchair today.  Review of Systems is noted in the HPI, as appropriate  Objective:   BP 132/70 (BP Location: Left Arm, Patient Position: Sitting)   Pulse 65   Temp 98.6 F (37 C) (Temporal)   Ht 5\' 5"  (1.651 m)   SpO2 99%   BMI 46.10 kg/m   GEN: No acute distress; alert,appropriate. PULM: Breathing comfortably in no respiratory distress PSYCH: Normally interactive.   Examined in a wheelchair. She does have pain with palpation and deep compression of the patella.  She has significant pain on the medial and lateral joint lines. She lacks 6 degrees of extension and she is able to flex her knee to 95 degrees.  Laboratory and Imaging Data:  Assessment and Plan:     ICD-10-CM   1. Primary osteoarthritis of left knee  M17.12 triamcinolone acetonide (KENALOG-40) injection 40 mg     Acute on chronic left-sided knee  osteoarthritis with exacerbation.  For symptomatic relief, I am going to do a corticosteroid injection today.  Aspiration/Injection Procedure Note [name] [birth year] Date of procedure: 10/31/2023  Procedure: Large Joint Aspiration / Injection of Knee, L Indications: Pain  Procedure Details Patient verbally consented to procedure. Risks, benefits, and alternatives explained. Sterilely prepped with Chloraprep. Ethyl cholride used for anesthesia. 9 cc Lidocaine 1% mixed with 1 mL of Kenalog 40 mg injected using the anteromedial approach without difficulty. No complications with procedure and tolerated well. Patient had decreased pain post-injection. Medication: 1 mL of Kenalog 40 mg   Medication Management during today's office visit: Meds ordered this encounter  Medications   triamcinolone acetonide (KENALOG-40) injection 40 mg    Disposition: Follow-up as needed  [employer] One speech-to-text software was used for transcription in this dictation.  Possible transcriptional errors can occur using Animal nutritionist.   Signed,  [name]. [name], MD   Outpatient Encounter Medications as of 10/31/2023  Medication Sig   amLODipine (NORVASC) 10 MG tablet Take 1 tablet (10 mg total) by mouth at bedtime.   aspirin EC 81 MG tablet Take 1 tablet (81 mg total) by mouth daily. Swallow whole.   atorvastatin (LIPITOR) 80 MG tablet TAKE 1 TABLET BY MOUTH DAILY AT 6 PM.   carvedilol (COREG) 25 MG tablet TAKE 1 TABLET (25  MG TOTAL) BY MOUTH TWICE A DAY WITH MEALS   Cholecalciferol (VITAMIN D3) 50 MCG (2000 UT) TABS Take 2,000 Units by mouth daily.   clopidogrel (PLAVIX) 75 MG tablet TAKE 1 TABLET BY MOUTH EVERY DAY   doxylamine, Sleep, (UNISOM) 25 MG tablet Take 25 mg by mouth at bedtime as needed for sleep.   Dulaglutide (TRULICITY) 1.5 MG/0.5ML SOPN Inject 1.5 mg into the skin once a week.   Evolocumab (REPATHA SURECLICK) 140 MG/ML SOAJ INJECT 1 PEN INTO THE SKIN EVERY 14 (FOURTEEN)  DAYS.   FARXIGA 10 MG TABS tablet Take 10 mg by mouth daily.   ferrous sulfate 325 (65 FE) MG tablet TAKE 1 TABLET BY MOUTH EVERY DAY WITH BREAKFAST   furosemide (LASIX) 40 MG tablet Take 1 tablet (40 mg total) by mouth daily.   glucose blood (ONETOUCH ULTRA) test strip 1 each by Other route 2 (two) times daily. LAST REFILL SCHEDULE PHYSICAL   isosorbide mononitrate (IMDUR) 60 MG 24 hr tablet TAKE 1 TABLET BY MOUTH EVERY DAY   Magnesium 200 MG TABS Take 200 mg by mouth daily.   nitroGLYCERIN (NITROSTAT) 0.4 MG SL tablet Place 1 tablet (0.4 mg total) under the tongue every 5 (five) minutes as needed for chest pain.   ondansetron (ZOFRAN-ODT) 4 MG disintegrating tablet TAKE 1 TABLET BY MOUTH EVERY 8 HOURS AS NEEDED FOR NAUSEA AND VOMITING   pantoprazole (PROTONIX) 40 MG tablet TAKE 1 TABLET BY MOUTH EVERY DAY   spironolactone (ALDACTONE) 25 MG tablet Take 1 tablet (25 mg total) by mouth daily.   vitamin B-12 (CYANOCOBALAMIN) 500 MCG tablet Take 2 tablets (1,000 mcg total) by mouth daily.   [EXPIRED] triamcinolone acetonide (KENALOG-40) injection 40 mg    No facility-administered encounter medications on file as of 10/31/2023.

## 2023-11-01 ENCOUNTER — Other Ambulatory Visit: Payer: Self-pay | Admitting: Internal Medicine

## 2023-11-01 ENCOUNTER — Other Ambulatory Visit: Payer: Self-pay | Admitting: Nurse Practitioner

## 2023-11-01 DIAGNOSIS — I2510 Atherosclerotic heart disease of native coronary artery without angina pectoris: Secondary | ICD-10-CM

## 2023-11-01 DIAGNOSIS — E7800 Pure hypercholesterolemia, unspecified: Secondary | ICD-10-CM

## 2023-11-01 DIAGNOSIS — K219 Gastro-esophageal reflux disease without esophagitis: Secondary | ICD-10-CM

## 2023-11-04 ENCOUNTER — Ambulatory Visit: Payer: Medicare PPO

## 2023-11-04 VITALS — Ht 65.0 in | Wt 277.0 lb

## 2023-11-04 DIAGNOSIS — E119 Type 2 diabetes mellitus without complications: Secondary | ICD-10-CM

## 2023-11-04 DIAGNOSIS — Z0000 Encounter for general adult medical examination without abnormal findings: Secondary | ICD-10-CM | POA: Diagnosis not present

## 2023-11-04 MED ORDER — REPATHA SURECLICK 140 MG/ML ~~LOC~~ SOAJ: 140.0000 mg | SUBCUTANEOUS | 3 refills | Status: AC

## 2023-11-04 NOTE — Addendum Note (Signed)
Addended by: Alvina Chou on: 11/04/2023 10:32 AM   Modules accepted: Orders

## 2023-11-04 NOTE — Patient Instructions (Signed)
Ms. Shaddix , Thank you for taking time to come for your Medicare Wellness Visit. I appreciate your ongoing commitment to your health goals. Please review the following plan we discussed and let me know if I can assist you in the future.   Referrals/Orders/Follow-Ups/Clinician Recommendations: none  This is a list of the screening recommended for you and due dates:  Health Maintenance  Topic Date Due   Complete foot exam   03/29/2022   Eye exam for diabetics  09/26/2022   DTaP/Tdap/Td vaccine (2 - Td or Tdap) 10/09/2022   Zoster (Shingles) Vaccine (2 of 2) 11/27/2022   Yearly kidney health urinalysis for diabetes  04/05/2023   COVID-19 Vaccine (5 - 2024-25 season) 07/21/2023   Hemoglobin A1C  01/23/2024   Yearly kidney function blood test for diabetes  10/08/2024   Medicare Annual Wellness Visit  11/03/2024   Pneumonia Vaccine  Completed   Flu Shot  Completed   DEXA scan (bone density measurement)  Completed   Hepatitis C Screening  Completed   HPV Vaccine  Aged Out   Colon Cancer Screening  Discontinued    Advanced directives: (Declined) Advance directive discussed with you today. Even though you declined this today, please call our office should you change your mind, and we can give you the proper paperwork for you to fill out.  Next Medicare Annual Wellness Visit scheduled for next year: Yes 11/04/2024 @ 8:50am telephone

## 2023-11-04 NOTE — Progress Notes (Signed)
Subjective:   Alicia [name] is a 77 y.o. female who presents for Medicare Annual (Subsequent) preventive examination.  Visit Complete: Virtual I connected with  [name] on 11/04/23 by a audio enabled telemedicine application and verified that I am speaking with the correct person using two identifiers.  Patient Location: Home  Provider Location: Office/Clinic  I discussed the limitations of evaluation and management by telemedicine. The patient expressed understanding and agreed to proceed.  Vital Signs: Because this visit was a virtual/telehealth visit, some criteria may be missing or patient reported. Any vitals not documented were not able to be obtained and vitals that have been documented are patient reported.  Patient Medicare AWV questionnaire was completed by the patient on (not done); I have confirmed that all information answered by patient is correct and no changes since this date.  Cardiac Risk Factors include: advanced age (>54men, >5 women);diabetes mellitus;dyslipidemia;hypertension;obesity (BMI >30kg/m2);sedentary lifestyle    Objective:    Today's Vitals   11/04/23 0913  Weight: 277 lb (125.6 kg)  Height: 5\' 5"  (1.651 m)  PainSc: 4    Body mass index is 46.1 kg/m.     11/04/2023    9:34 AM 11/07/2022    2:01 PM 04/18/2022    3:26 PM 04/17/2022   10:57 AM 02/02/2022   10:05 AM 11/08/2021    3:09 PM 06/13/2021   12:37 PM  Advanced Directives  Does Patient Have a Medical Advance Directive? No No No No No No No  Would patient like information on creating a medical advance directive?  No - Patient declined  No - Patient declined   No - Patient declined    Current Medications (verified) Outpatient Encounter Medications as of 11/04/2023  Medication Sig   amLODipine (NORVASC) 10 MG tablet Take 1 tablet (10 mg total) by mouth at bedtime.   aspirin EC 81 MG tablet Take 1 tablet (81 mg total) by mouth daily. Swallow whole.   atorvastatin (LIPITOR) 80 MG  tablet TAKE 1 TABLET BY MOUTH DAILY AT 6 PM.   carvedilol (COREG) 25 MG tablet TAKE 1 TABLET (25 MG TOTAL) BY MOUTH TWICE A DAY WITH MEALS   Cholecalciferol (VITAMIN D3) 50 MCG (2000 UT) TABS Take 2,000 Units by mouth daily.   clopidogrel (PLAVIX) 75 MG tablet TAKE 1 TABLET BY MOUTH EVERY DAY   doxylamine, Sleep, (UNISOM) 25 MG tablet Take 25 mg by mouth at bedtime as needed for sleep.   Dulaglutide (TRULICITY) 1.5 MG/0.5ML SOPN Inject 1.5 mg into the skin once a week.   Evolocumab (REPATHA SURECLICK) 140 MG/ML SOAJ INJECT 140 MG INTO THE SKIN EVERY 14 (FOURTEEN) DAYS.   FARXIGA 10 MG TABS tablet Take 10 mg by mouth daily.   ferrous sulfate 325 (65 FE) MG tablet TAKE 1 TABLET BY MOUTH EVERY DAY WITH BREAKFAST   furosemide (LASIX) 40 MG tablet Take 1 tablet (40 mg total) by mouth daily.   glucose blood (ONETOUCH ULTRA) test strip 1 each by Other route 2 (two) times daily. LAST REFILL SCHEDULE PHYSICAL   isosorbide mononitrate (IMDUR) 60 MG 24 hr tablet TAKE 1 TABLET BY MOUTH EVERY DAY   Magnesium 200 MG TABS Take 200 mg by mouth daily.   nitroGLYCERIN (NITROSTAT) 0.4 MG SL tablet Place 1 tablet (0.4 mg total) under the tongue every 5 (five) minutes as needed for chest pain.   ondansetron (ZOFRAN-ODT) 4 MG disintegrating tablet TAKE 1 TABLET BY MOUTH EVERY 8 HOURS AS NEEDED FOR NAUSEA AND VOMITING  pantoprazole (PROTONIX) 40 MG tablet TAKE 1 TABLET BY MOUTH EVERY DAY   spironolactone (ALDACTONE) 25 MG tablet Take 1 tablet (25 mg total) by mouth daily.   vitamin B-12 (CYANOCOBALAMIN) 500 MCG tablet Take 2 tablets (1,000 mcg total) by mouth daily.   No facility-administered encounter medications on file as of 11/04/2023.    Allergies (verified) Iodinated contrast media and Sulfa antibiotics   History: Past Medical History:  Diagnosis Date   Anemia    Arthritis    "right knee" (10/21/2015)   CAD (coronary artery disease)    a. Cath 10/21/15: s/p DES to RCA: moderate diffuse stenosis of mod  diagonal, diffuse irregularity LCx and LAD. 2D echo 10/21/15: mod LVH, EF 60-65%, no RWMA, calcified MV, mod-severe LAE. 03/18/17 PCI with DES--> 1 diag   Cataract    CKD (chronic kidney disease), stage IV (HCC)    Diabetes (HCC)    Essential hypertension    Family history of adverse reaction to anesthesia    "oldest sister, Malachi Bonds, related to brain formation at back of head; when they put her to sleep it's hard to wake her up"   GERD (gastroesophageal reflux disease)    History of blood transfusion    "related to ruptured tubal pregnancy"   Hyperlipidemia    Hypertensive heart disease    Morbid obesity (HCC)    Stroke (cerebrum) (HCC)    Tubular adenoma of colon    Type II diabetes mellitus (HCC)    Past Surgical History:  Procedure Laterality Date   ABDOMINAL HYSTERECTOMY  1988   fibroid   APPENDECTOMY  1985   BARIATRIC SURGERY  1984   BIOPSY  07/18/2020   Procedure: BIOPSY;  Surgeon: Beverley Fiedler, MD;  Location: [facility];  Service: Gastroenterology;;   CARDIAC CATHETERIZATION N/A 10/21/2015   Procedure: Right/Left Heart Cath and Coronary Angiography;  Surgeon: Tonny Bollman, MD;  Location: [facility] INVASIVE CV LAB;  Service: Cardiovascular;  Laterality: N/A;   CHOLECYSTECTOMY OPEN  1984   COLONOSCOPY WITH PROPOFOL N/A 07/18/2020   Procedure: COLONOSCOPY WITH PROPOFOL;  Surgeon: Beverley Fiedler, MD;  Location: [facility];  Service: Gastroenterology;  Laterality: N/A;   CORONARY PRESSURE/FFR STUDY N/A 09/04/2019   Procedure: INTRAVASCULAR PRESSURE WIRE/FFR STUDY;  Surgeon: Yvonne Kendall, MD;  Location: [facility] INVASIVE CV LAB;  Service: Cardiovascular;  Laterality: N/A;   CORONARY STENT INTERVENTION N/A 03/18/2017   Procedure: Coronary Stent Intervention;  Surgeon: Marykay Lex, MD;  Location: [facility] INVASIVE CV LAB;  Service: Cardiovascular;  Laterality: N/A;   CORONARY STENT PLACEMENT  03/18/2017   A OPTIMIZE STUDY Drug Eluting Stent (2.5 mm 18 mm - post-dilated to 2.7 mm) was  successfully placed   ECTOPIC PREGNANCY SURGERY     ENDOSCOPIC MUCOSAL RESECTION N/A 01/23/2021   Procedure: ENDOSCOPIC MUCOSAL RESECTION;  Surgeon: Lemar Lofty., MD;  Location: [facility];  Service: Gastroenterology;  Laterality: N/A;   ESOPHAGOGASTRODUODENOSCOPY (EGD) WITH PROPOFOL N/A 07/18/2020   Procedure: ESOPHAGOGASTRODUODENOSCOPY (EGD) WITH PROPOFOL;  Surgeon: Beverley Fiedler, MD;  Location: [facility];  Service: Gastroenterology;  Laterality: N/A;   ESOPHAGOGASTRODUODENOSCOPY (EGD) WITH PROPOFOL N/A 01/23/2021   Procedure: ESOPHAGOGASTRODUODENOSCOPY (EGD) WITH PROPOFOL;  Surgeon: Meridee Score Netty Starring., MD;  Location: [facility];  Service: Gastroenterology;  Laterality: N/A;   EUS N/A 01/23/2021   Procedure: UPPER ENDOSCOPIC ULTRASOUND (EUS) RADIAL;  Surgeon: Lemar Lofty., MD;  Location: [facility];  Service: Gastroenterology;  Laterality: N/A;   HEMOSTASIS CLIP PLACEMENT  01/23/2021   Procedure: HEMOSTASIS CLIP  PLACEMENT;  Surgeon: Meridee Score Netty Starring., MD;  Location: [facility];  Service: Gastroenterology;;   LEFT HEART CATH AND CORONARY ANGIOGRAPHY N/A 03/18/2017   Procedure: Left Heart Cath and Coronary Angiography;  Surgeon: Marykay Lex, MD;  Location: [facility] INVASIVE CV LAB;  Service: Cardiovascular;  Laterality: N/A;   LEFT HEART CATH AND CORONARY ANGIOGRAPHY N/A 09/04/2019   Procedure: LEFT HEART CATH AND CORONARY ANGIOGRAPHY;  Surgeon: Yvonne Kendall, MD;  Location: [facility] INVASIVE CV LAB;  Service: Cardiovascular;  Laterality: N/A;   POLYPECTOMY  07/18/2020   Procedure: POLYPECTOMY;  Surgeon: Beverley Fiedler, MD;  Location: [facility];  Service: Gastroenterology;;   REDUCTION MAMMAPLASTY Bilateral ~ 1976   SUBMUCOSAL LIFTING INJECTION  01/23/2021   Procedure: SUBMUCOSAL LIFTING INJECTION;  Surgeon: Lemar Lofty., MD;  Location: [facility];  Service: Gastroenterology;;   TONSILLECTOMY     Family History  Problem Relation Age of Onset    Diabetes Mother    Heart attack Mother 80       2007   Hypertension Mother    Hyperlipidemia Mother    Heart disease Mother    Heart attack Father    Stroke Father 86   Hypertension Father    Hyperlipidemia Father    Cancer Sister        Gastric cancer in 2013   Stomach cancer Sister        in the cavity behind the stomach   Diabetes Sister    Diabetes Sister    Diabetes Sister    Migraines Daughter        brain surgery   Migraines Daughter    Hypertension Son        weight loss corrected that   Breast cancer Neg Hx    Colon cancer Neg Hx    Inflammatory bowel disease Neg Hx    Liver disease Neg Hx    Esophageal cancer Neg Hx    Pancreatic cancer Neg Hx    Rectal cancer Neg Hx    Social History   Socioeconomic History   Marital status: Divorced    Spouse name: Not on file   Number of children: 3   Years of education: doctorate   Highest education level: Not on file  Occupational History   Occupation: Teacher/retired  Tobacco Use   Smoking status: Never   Smokeless tobacco: Never  Vaping Use   Vaping status: Never Used  Substance and Sexual Activity   Alcohol use: No   Drug use: No   Sexual activity: Not Currently  Other Topics Concern   Not on file  Social History Narrative   Caffeine use-yes   Regular exercise-no   Social Drivers of Health   Financial Resource Strain: Low Risk  (11/04/2023)   Overall Financial Resource Strain (CARDIA)    Difficulty of Paying Living Expenses: Not hard at all  Food Insecurity: No Food Insecurity (11/04/2023)   Hunger Vital Sign    Worried About Running Out of Food in the Last Year: Never true    Ran Out of Food in the Last Year: Never true  Transportation Needs: No Transportation Needs (11/04/2023)   PRAPARE - Administrator, Civil Service (Medical): No    Lack of Transportation (Non-Medical): No  Physical Activity: Inactive (11/04/2023)   Exercise Vital Sign    Days of Exercise per Week: 0 days     Minutes of Exercise per Session: 0 min  Stress: No Stress Concern Present (11/04/2023)   [employer]  of Occupational Health - Occupational Stress Questionnaire    Feeling of Stress : Not at all  Social Connections: Moderately Isolated (11/04/2023)   Social Connection and Isolation Panel [NHANES]    Frequency of Communication with Friends and Family: Three times a week    Frequency of Social Gatherings with Friends and Family: More than three times a week    Attends Religious Services: More than 4 times per year    Active Member of [employer] or Organizations: No    Attends Engineer, structural: Never    Marital Status: Divorced    Tobacco Counseling Counseling given: Not Answered  Clinical Intake:  Pre-visit preparation completed: No  Pain : 0-10 Pain Score: 4  Pain Type: Chronic pain Pain Location: Knee (both knees) Pain Descriptors / Indicators: Aching Pain Onset: More than a month ago Pain Frequency: Constant Pain Relieving Factors: injection in knees, stretching and walking helps, Advil at night  BMI - recorded: 46.1 Nutritional Status: BMI > 30  Obese Nutritional Risks: None Diabetes: Yes CBG done?: No Did pt. bring in CBG monitor from home?: No  How often do you need to have someone help you when you read instructions, pamphlets, or other written materials from your doctor or pharmacy?: 1 - Never  Interpreter Needed?: No  Comments: lives with siter and brother Information entered by :: B.Shonnie Poudrier,LPN   Activities of Daily Living    11/04/2023    9:34 AM 11/07/2022    2:02 PM  In your present state of health, do you have any difficulty performing the following activities:  Hearing? 0 0  Vision? 1 0  Difficulty concentrating or making decisions? 0 0  Walking or climbing stairs? 1 1  Dressing or bathing? 0 0  Doing errands, shopping? 0 0  Preparing Food and eating ? N N  Using the Toilet? N N  In the past six months, have you accidently leaked  urine? Y N  Do you have problems with loss of bowel control? N N  Managing your Medications? N N  Managing your Finances? N N  Housekeeping or managing your Housekeeping? Y N    Patient Care Team: [name], NP as PCP - General (Pain Medicine) Pricilla Riffle, MD as PCP - Cardiology (Cardiology) Pricilla Riffle, MD as Consulting Physician (Cardiology) Pa, [facility]  Indicate any recent Medical Services you may have received from other than [facility] providers in the past year (date may be approximate).     Assessment:   This is a routine wellness examination for Alicia [name].  Hearing/Vision screen Hearing Screening - Comments:: Pt says her hearing is good; has hearing aids Vision Screening - Comments:: Pt says her vision is good with glasses but has lost them Dr Buel Ream Opthal   Goals Addressed             This Visit's Progress    DIET - EAT MORE FRUITS AND VEGETABLES   On track    Weight (lb) < 200 lb (90.7 kg)   277 lb (125.6 kg)    Pt says she would like to lose 20 more pounds     COMPLETED: Weight < 200 lb (90.7 kg)   277 lb (125.6 kg)    Pt would like to loe 15 lbs over the next 3 months       Depression Screen    11/04/2023    9:26 AM 04/16/2023   11:52 AM 11/29/2022    2:24 PM 11/07/2022  1:59 PM 11/06/2021    2:17 PM 07/04/2021   12:04 PM 03/28/2020    3:42 PM  PHQ 2/9 Scores  PHQ - 2 Score 0 0 0 0 0 0 0  PHQ- 9 Score  10 6 0 1 5 0    Fall Risk    11/04/2023    9:20 AM 11/07/2022    2:02 PM 11/06/2021    2:19 PM 07/04/2021   11:21 AM 03/28/2020    3:42 PM  Fall Risk   Falls in the past year? 1 0 1 0 1  Number falls in past yr: 0 0 0 0 1  Injury with Fall? 0 0 1  0  Risk for fall due to : History of fall(s);Impaired mobility;Impaired balance/gait No Fall Risks Impaired balance/gait  Impaired balance/gait  Follow up Education provided;Falls prevention discussed Falls prevention discussed;Falls evaluation completed   Falls  evaluation completed;Education provided    MEDICARE RISK AT HOME: Medicare Risk at Home Any stairs in or around the home?: No If so, are there any without handrails?: No Home free of loose throw rugs in walkways, pet beds, electrical cords, etc?: Yes Adequate lighting in your home to reduce risk of falls?: Yes Life alert?: Yes Use of a cane, walker or w/c?: Yes (walker) Grab bars in the bathroom?: Yes Shower chair or bench in shower?: Yes Elevated toilet seat or a handicapped toilet?: Yes  TIMED UP AND GO:  Was the test performed?  No    Cognitive Function:        11/04/2023    9:36 AM 11/07/2022    2:04 PM  6CIT Screen  What Year? 0 points 0 points  What month? 0 points 0 points  What time? 0 points 0 points  Count back from 20 0 points 0 points  Months in reverse 0 points 0 points  Repeat phrase 2 points 0 points  Total Score 2 points 0 points    Immunizations Immunization History  Administered Date(s) Administered   Fluad Quad(high Dose 65+) 09/05/2021, 07/30/2022   Fluad Trivalent(High Dose 65+) 08/12/2023   Influenza Split 10/09/2012   Influenza,inj,Quad PF,6+ Mos 10/10/2015, 08/13/2016   Moderna Covid-19 Fall Seasonal Vaccine 56yrs & older 10/02/2022   PFIZER(Purple Top)SARS-COV-2 Vaccination 01/05/2020, 01/26/2020, 09/20/2020   Pneumococcal Conjugate-13 10/10/2015   Pneumococcal Polysaccharide-23 10/09/2012   Tdap 10/09/2012   Zoster Recombinant(Shingrix) 10/02/2022    TDAP status: Up to date  Flu Vaccine status: Up to date  Pneumococcal vaccine status: Up to date  Covid-19 vaccine status: Completed vaccines  Qualifies for Shingles Vaccine? Yes   Zostavax completed Yes   Shingrix Completed?: Yes  Screening Tests Health Maintenance  Topic Date Due   Diabetic kidney evaluation - Urine ACR  04/05/2023   FOOT EXAM  12/05/2023 (Originally 03/29/2022)   Zoster Vaccines- Shingrix (2 of 2) 02/02/2024 (Originally 11/27/2022)   COVID-19 Vaccine (5 -  2024-25 season) 02/17/2024 (Originally 07/21/2023)   OPHTHALMOLOGY EXAM  02/24/2024 (Originally 09/26/2022)   DTaP/Tdap/Td (2 - Td or Tdap) 11/03/2024 (Originally 10/09/2022)   HEMOGLOBIN A1C  01/23/2024   Diabetic kidney evaluation - eGFR measurement  10/08/2024   Medicare Annual Wellness (AWV)  11/03/2024   Pneumonia Vaccine 94+ Years old  Completed   INFLUENZA VACCINE  Completed   DEXA SCAN  Completed   Hepatitis C Screening  Completed   HPV VACCINES  Aged Out   Colonoscopy  Discontinued    Health Maintenance  Health Maintenance Due  Topic Date Due  Diabetic kidney evaluation - Urine ACR  04/05/2023    Colorectal cancer screening: No longer required.   Mammogram status: No longer required due to age.  Bone Density status: Completed 02/12/2022. Results reflect: Bone density results: NORMAL. Repeat every 5 years.  Lung Cancer Screening: (Low Dose CT Chest recommended if Age 12-80 years, 20 pack-year currently smoking OR have quit w/in 15years.) does not qualify.   Lung Cancer Screening Referral: no  Additional Screening:  Hepatitis C Screening: does not qualify; Completed 12/20/2017  Vision Screening: Recommended annual ophthalmology exams for early detection of glaucoma and other disorders of the eye. Is the patient up to date with their annual eye exam?  Yes  Who is the provider or what is the name of the office in which the patient attends annual eye exams? Dr Amaryllis Dyke If pt is not established with a provider, would they like to be referred to a provider to establish care? No .   Dental Screening: Recommended annual dental exams for proper oral hygiene  Diabetic Foot Exam: Diabetic Foot Exam: Overdue, Pt has been advised about the importance in completing this exam. Pt is scheduled for diabetic foot exam on next visit w/PCP.  Community Resource Referral / Chronic Care Management: CRR required this visit?  No   CCM required this visit?  No    Plan:     I have  personally reviewed and noted the following in the patient's chart:   Medical and social history Use of alcohol, tobacco or illicit drugs  Current medications and supplements including opioid prescriptions. Patient is not currently taking opioid prescriptions. Functional ability and status Nutritional status Physical activity Advanced directives List of other physicians Hospitalizations, surgeries, and ER visits in previous 12 months Vitals Screenings to include cognitive, depression, and falls Referrals and appointments  In addition, I have reviewed and discussed with patient certain preventive protocols, quality metrics, and best practice recommendations. A written personalized care plan for preventive services as well as general preventive health recommendations were provided to patient.     Sue Lush, LPN   [id number]   After Visit Summary: (MyChart) Due to this being a telephonic visit, the after visit summary with patients personalized plan was offered to patient via MyChart   Nurse Notes: The patient states she is doing well and has no concerns or questions at this time. She is managing pain in her knees but sees specialist for such.

## 2023-11-06 ENCOUNTER — Ambulatory Visit: Payer: Medicare PPO | Admitting: Nurse Practitioner

## 2023-11-07 ENCOUNTER — Encounter: Payer: Self-pay | Admitting: Nurse Practitioner

## 2023-11-11 ENCOUNTER — Ambulatory Visit: Payer: Medicare PPO | Admitting: Nurse Practitioner

## 2023-11-11 VITALS — BP 138/86 | HR 66 | Temp 98.2°F | Ht 65.0 in | Wt 277.0 lb

## 2023-11-11 DIAGNOSIS — E119 Type 2 diabetes mellitus without complications: Secondary | ICD-10-CM

## 2023-11-11 DIAGNOSIS — I10 Essential (primary) hypertension: Secondary | ICD-10-CM

## 2023-11-11 DIAGNOSIS — Z7985 Long-term (current) use of injectable non-insulin antidiabetic drugs: Secondary | ICD-10-CM | POA: Diagnosis not present

## 2023-11-11 LAB — POCT GLYCOSYLATED HEMOGLOBIN (HGB A1C): Hemoglobin A1C: 9.2 % — AB (ref 4.0–5.6)

## 2023-11-11 MED ORDER — TRULICITY 3 MG/0.5ML ~~LOC~~ SOAJ: 3.0000 mg | SUBCUTANEOUS | 0 refills | Status: DC

## 2023-11-11 NOTE — Assessment & Plan Note (Signed)
Patient currently maintained on carvedilol, spironolactone, isosorbide, amlodipine.  Blood pressure well-controlled.  Continue medication as prescribed

## 2023-11-11 NOTE — Assessment & Plan Note (Signed)
Patient maintained on 5 to 10 mg and Trulicity 1.5 mg weekly.  A1c was 9 manage time up to 9.2 this time.  Will increase Trulicity to 3 mg weekly.  Patient can use 2 over 1.5 mg injections at a time to equal 3 mg injection until she finishes her supply.  New prescription sent to pharmacy

## 2023-11-11 NOTE — Patient Instructions (Signed)
Nice to see you today I will be in touch with the urine test results once I have them Follow up with me in 3 month, sooner if you need me  You can give yourself 2 of the trulicity 1.5mg  shots to equal 3.0mg . I have sent in an updated prescription tot he pharmacy

## 2023-11-11 NOTE — Progress Notes (Signed)
Established Patient Office Visit  Subjective   Patient ID: Alicia [name], female    DOB: 05/02/1946  Age: 77 y.o. MRN: [id number]  Chief Complaint  Patient presents with   Diabetes      DM2: patinet is currently maintained on farxiga 10 and trulicity 1.5mg  weekly. Patietn was last seen by me on 07/26/2023  States that she is checking her glucose daily 146-172. Still doing the nutrisystem diet  HTN: Patient currently maintained on amlodipine, carvedilol, isosorbide, spironolactone, furosemide.  Patient will check blood pressure intermittently at home.  She denies any lightheadedness or dizziness  Falls: states that she has a walker/rollator/ Sta[employer] that states that twice she wen to sit down and the walker and missed it. The thirs time she is unsure.  Patient denies hitting her head or LOC on any of the falls      Review of Systems  Constitutional:  Negative for chills and fever.  Respiratory:  Negative for shortness of breath.   Cardiovascular:  Negative for chest pain.  Neurological:  Negative for dizziness and headaches.      Objective:     BP 138/86   Pulse 66   Temp 98.2 F (36.8 C) (Oral)   Ht 5\' 5"  (1.651 m)   Wt 277 lb (125.6 kg) Comment: Wheelchair bound  SpO2 98%   BMI 46.10 kg/m  BP Readings from Last 3 Encounters:  11/11/23 138/86  10/31/23 132/70  10/30/23 (!) 165/86   Wt Readings from Last 3 Encounters:  11/11/23 277 lb (125.6 kg)  11/04/23 277 lb (125.6 kg)  10/30/23 277 lb (125.6 kg)   SpO2 Readings from Last 3 Encounters:  11/11/23 98%  10/31/23 99%  10/30/23 100%      Physical Exam Vitals and nursing note reviewed.  Constitutional:      Appearance: Normal appearance.  Cardiovascular:     Rate and Rhythm: Normal rate and regular rhythm.     Heart sounds: Normal heart sounds.  Pulmonary:     Effort: Pulmonary effort is normal.     Breath sounds: Normal breath sounds.  Abdominal:     General: Bowel sounds are normal.   Neurological:     Mental Status: She is alert.      Results for orders placed or performed in visit on 11/11/23  POCT glycosylated hemoglobin (Hb A1C)  Result Value Ref Range   Hemoglobin A1C 9.2 (A) 4.0 - 5.6 %   HbA1c POC (<> result, manual entry)     HbA1c, POC (prediabetic range)     HbA1c, POC (controlled diabetic range)        The ASCVD Risk score (Arnett DK, et al., 2019) failed to calculate for the following reasons:   Risk score cannot be calculated because patient has a medical history suggesting prior/existing ASCVD    Assessment & Plan:   Problem List Items Addressed This Visit       Cardiovascular and Mediastinum   Essential hypertension (Chronic)   Patient currently maintained on carvedilol, spironolactone, isosorbide, amlodipine.  Blood pressure well-controlled.  Continue medication as prescribed        Endocrine   Type 2 diabetes mellitus without complication, without long-term current use of insulin (HCC) - Primary   Patient maintained on 5 to 10 mg and Trulicity 1.5 mg weekly.  A1c was 9 manage time up to 9.2 this time.  Will increase Trulicity to 3 mg weekly.  Patient can use 2 over 1.5 mg injections at a time  to equal 3 mg injection until she finishes her supply.  New prescription sent to pharmacy      Relevant Medications   Dulaglutide (TRULICITY) 3 MG/0.5ML SOAJ   Other Relevant Orders   POCT glycosylated hemoglobin (Hb A1C) (Completed)   Microalbumin / creatinine urine ratio    Return in about 3 months (around 02/09/2024) for DM recheck.    Audria Nine, NP

## 2023-11-21 ENCOUNTER — Ambulatory Visit (HOSPITAL_COMMUNITY)
Admission: RE | Admit: 2023-11-21 | Discharge: 2023-11-21 | Disposition: A | Discharge status: Home / Self Care | Payer: Medicare PPO | Source: Ambulatory Visit | Attending: Nephrology | Admitting: Nephrology

## 2023-11-21 VITALS — BP 147/79 | HR 79 | Temp 97.2°F | Resp 16

## 2023-11-21 DIAGNOSIS — D631 Anemia in chronic kidney disease: Secondary | ICD-10-CM | POA: Diagnosis not present

## 2023-11-21 DIAGNOSIS — D508 Other iron deficiency anemias: Secondary | ICD-10-CM | POA: Insufficient documentation

## 2023-11-21 DIAGNOSIS — D509 Iron deficiency anemia, unspecified: Secondary | ICD-10-CM | POA: Insufficient documentation

## 2023-11-21 LAB — RENAL FUNCTION PANEL
Albumin: 3.7 g/dL (ref 3.5–5.0)
Anion gap: 12 (ref 5–15)
BUN: 38 mg/dL — ABNORMAL HIGH (ref 8–23)
CO2: 26 mmol/L (ref 22–32)
Calcium: 8.9 mg/dL (ref 8.9–10.3)
Chloride: 99 mmol/L (ref 98–111)
Creatinine, Ser: 2.87 mg/dL — ABNORMAL HIGH (ref 0.44–1.00)
GFR, Estimated: 16 mL/min — ABNORMAL LOW (ref >60)
Glucose, Bld: 212 mg/dL — ABNORMAL HIGH (ref 70–99)
Phosphorus: 4.1 mg/dL (ref 2.5–4.6)
Potassium: 4.7 mmol/L (ref 3.5–5.1)
Sodium: 137 mmol/L (ref 135–145)

## 2023-11-21 LAB — IRON AND TIBC
Iron: 98 ug/dL (ref 28–170)
Saturation Ratios: 37 % — ABNORMAL HIGH (ref 10.4–31.8)
TIBC: 267 ug/dL (ref 250–450)
UIBC: 169 ug/dL

## 2023-11-21 LAB — POCT HEMOGLOBIN-HEMACUE: Hemoglobin: 11.2 g/dL — ABNORMAL LOW (ref 12.0–15.0)

## 2023-11-21 LAB — FERRITIN: Ferritin: 858 ng/mL — ABNORMAL HIGH (ref 11–307)

## 2023-11-21 MED ORDER — EPOETIN ALFA-EPBX 10000 UNIT/ML IJ SOLN
INTRAMUSCULAR | Status: AC
  Filled 2023-11-21: qty 2

## 2023-11-21 MED ORDER — EPOETIN ALFA-EPBX 10000 UNIT/ML IJ SOLN
20000.0000 [IU] | INTRAMUSCULAR | Status: DC
  Administered 2023-11-21: 20000 [IU] via SUBCUTANEOUS

## 2023-11-22 LAB — PTH, INTACT AND CALCIUM
Calcium, Total (PTH): 8.9 mg/dL (ref 8.7–10.3)
PTH: 117 pg/mL — ABNORMAL HIGH (ref 15–65)

## 2023-12-09 ENCOUNTER — Telehealth: Payer: Self-pay | Admitting: Physician Assistant

## 2023-12-10 ENCOUNTER — Telehealth: Payer: Self-pay

## 2023-12-10 NOTE — Progress Notes (Signed)
Care Guide Pharmacy Note  12/10/2023 Name: Alicia [name] MRN: [id number] DOB: [birth year]  Referred By: [name], NP Reason for referral: Care Coordination ([facility] Diabetes. )   Alicia [name] is a 78 y.o. year old female who is a primary care patient of [name], NP.  Alicia [name] was referred to the pharmacist for assistance related to: DMII  Successful contact was made with the patient to discuss pharmacy services.  Patient declines engagement at this time. Contact information was provided to the patient should they wish to reach out for assistance at a later time.  [facility]  [facility], [facility] Management Assistant Direct Dial: [phone]  Fax: [phone]

## 2023-12-20 ENCOUNTER — Inpatient Hospital Stay: Payer: Medicare PPO | Attending: Physician Assistant

## 2023-12-20 ENCOUNTER — Other Ambulatory Visit: Payer: Self-pay

## 2023-12-20 ENCOUNTER — Other Ambulatory Visit: Payer: Self-pay | Admitting: Physician Assistant

## 2023-12-20 ENCOUNTER — Encounter (HOSPITAL_COMMUNITY): Payer: Self-pay

## 2023-12-20 ENCOUNTER — Inpatient Hospital Stay: Payer: Medicare PPO | Admitting: Physician Assistant

## 2023-12-20 ENCOUNTER — Encounter: Payer: Self-pay | Admitting: Physician Assistant

## 2023-12-20 VITALS — BP 146/75 | HR 75 | Temp 97.7°F | Resp 18 | Ht 65.0 in | Wt 272.9 lb

## 2023-12-20 DIAGNOSIS — Z800 Family history of malignant neoplasm of digestive organs: Secondary | ICD-10-CM | POA: Insufficient documentation

## 2023-12-20 DIAGNOSIS — Z9884 Bariatric surgery status: Secondary | ICD-10-CM | POA: Insufficient documentation

## 2023-12-20 DIAGNOSIS — D563 Thalassemia minor: Secondary | ICD-10-CM | POA: Diagnosis not present

## 2023-12-20 DIAGNOSIS — D509 Iron deficiency anemia, unspecified: Secondary | ICD-10-CM | POA: Insufficient documentation

## 2023-12-20 DIAGNOSIS — D508 Other iron deficiency anemias: Secondary | ICD-10-CM

## 2023-12-20 DIAGNOSIS — D631 Anemia in chronic kidney disease: Secondary | ICD-10-CM | POA: Diagnosis not present

## 2023-12-20 DIAGNOSIS — N184 Chronic kidney disease, stage 4 (severe): Secondary | ICD-10-CM | POA: Insufficient documentation

## 2023-12-20 LAB — CMP (CANCER CENTER ONLY)
ALT: 16 U/L (ref 0–44)
AST: 17 U/L (ref 15–41)
Albumin: 4.1 g/dL (ref 3.5–5.0)
Alkaline Phosphatase: 98 U/L (ref 38–126)
Anion gap: 7 (ref 5–15)
BUN: 33 mg/dL — ABNORMAL HIGH (ref 8–23)
CO2: 29 mmol/L (ref 22–32)
Calcium: 9 mg/dL (ref 8.9–10.3)
Chloride: 102 mmol/L (ref 98–111)
Creatinine: 2.61 mg/dL — ABNORMAL HIGH (ref 0.44–1.00)
GFR, Estimated: 18 mL/min — ABNORMAL LOW (ref >60)
Glucose, Bld: 154 mg/dL — ABNORMAL HIGH (ref 70–99)
Potassium: 4.6 mmol/L (ref 3.5–5.1)
Sodium: 138 mmol/L (ref 135–145)
Total Bilirubin: 0.7 mg/dL (ref 0.0–1.2)
Total Protein: 6.7 g/dL (ref 6.5–8.1)

## 2023-12-20 LAB — CBC WITH DIFFERENTIAL (CANCER CENTER ONLY)
Abs Immature Granulocytes: 0.01 10*3/uL (ref 0.00–0.07)
Basophils Absolute: 0 10*3/uL (ref 0.0–0.1)
Basophils Relative: 1 %
Eosinophils Absolute: 0.1 10*3/uL (ref 0.0–0.5)
Eosinophils Relative: 2 %
HCT: 39.9 % (ref 36.0–46.0)
Hemoglobin: 11.5 g/dL — ABNORMAL LOW (ref 12.0–15.0)
Immature Granulocytes: 0 %
Lymphocytes Relative: 37 %
Lymphs Abs: 2 10*3/uL (ref 0.7–4.0)
MCH: 22.1 pg — ABNORMAL LOW (ref 26.0–34.0)
MCHC: 28.8 g/dL — ABNORMAL LOW (ref 30.0–36.0)
MCV: 76.6 fL — ABNORMAL LOW (ref 80.0–100.0)
Monocytes Absolute: 0.4 10*3/uL (ref 0.1–1.0)
Monocytes Relative: 7 %
Neutro Abs: 2.9 10*3/uL (ref 1.7–7.7)
Neutrophils Relative %: 53 %
Platelet Count: 208 10*3/uL (ref 150–400)
RBC: 5.21 MIL/uL — ABNORMAL HIGH (ref 3.87–5.11)
RDW: 17.3 % — ABNORMAL HIGH (ref 11.5–15.5)
WBC Count: 5.4 10*3/uL (ref 4.0–10.5)
nRBC: 0 % (ref 0.0–0.2)

## 2023-12-20 LAB — VITAMIN B12: Vitamin B-12: 2309 pg/mL — ABNORMAL HIGH (ref 180–914)

## 2023-12-20 LAB — IRON AND IRON BINDING CAPACITY (CC-WL,HP ONLY)
Iron: 95 ug/dL (ref 28–170)
Saturation Ratios: 37 % — ABNORMAL HIGH (ref 10.4–31.8)
TIBC: 255 ug/dL (ref 250–450)
UIBC: 160 ug/dL (ref 148–442)

## 2023-12-20 LAB — FERRITIN: Ferritin: 664 ng/mL — ABNORMAL HIGH (ref 11–307)

## 2023-12-20 NOTE — Progress Notes (Unsigned)
[facility] [phone]   [phone]  HEMATOLOGY AND ONCOLOGY PROGRESS NOTE  Patient Care Team: [name], NP as PCP - General (Pain Medicine) Pricilla Riffle, MD as PCP - Cardiology (Cardiology) Pricilla Riffle, MD as Consulting Physician (Cardiology) Pa, [facility]  Hematological/Oncological History #Microcytic Anemia #Anemia in Setting of CKD  -07/18/2020: Colonoscopy: one 5 mm polyp in descending colon. Diverticulosis in sigmoid colon and ascending colon.   -11/22/2020: WBC 4.5, Hgb 10.1 (L), MCV 69.6, Plt 235  -11/30/2020: Vitamin B12 186 (L), Retic Ct Pct 1.9%, Abs Retic 87,780 (H), Iron 62, Transferrin 219, iron saturation 20.2% (L), Ferritin 70.4, Folate 12.3. Started vitamin B12 2000 mcg per day.   -01/23/2021:EGD/EUS: Previous surgical intervention-query mini-gastric pouch found in the cardia. Submucosal nodule in the duodenum bulb removed via EMR. Pathology was consistent with polypoid dudoenal tissue showing nodular, mature adipose tissue within the submucosa, consistent with a lipoma. Negative for dysplasia or malignancy.  -03/29/2021: WBC 5.6, Hgb 9.2 (L), MCV 68.6 (L), Plt 251, Ferritin 56.6  -04/04/2021: Establish care with Georga Kaufmann PA-C  04/28/2021-05/26/2021: Received IV venofer x 5 doses.   10/23/2023-10/30/2023: Received IV feraheme 510 mg x 2 doses  HISTORY OF PRESENTING ILLNESS:  Alicia [name] 78 y.o. female returns for a follow up for microcytic anemia.  She was last seen on 06/18/2023.  In the interim since her last visit she has had no major changes in her health.  On exam today Alicia [name] reports her energy levels have improved. She is able to complete her ADLs on her own. She reports her knee pain has improved as well. She uses a cane to help with ambulation. She denies easy bruising or signs of bleeding. She denies fevers, chills, sweats, chest pain, cough, nausea, vomiting, diarrhea or constipation.  She has no other complaints. Rest of the 10 point ROS is below.   MEDICAL HISTORY:  Past Medical History:  Diagnosis Date   Anemia    Arthritis    "right knee" (10/21/2015)   CAD (coronary artery disease)    a. Cath 10/21/15: s/p DES to RCA: moderate diffuse stenosis of mod diagonal, diffuse irregularity LCx and LAD. 2D echo 10/21/15: mod LVH, EF 60-65%, no RWMA, calcified MV, mod-severe LAE. 03/18/17 PCI with DES--> 1 diag   Cataract    CKD (chronic kidney disease), stage IV (HCC)    Diabetes (HCC)    Essential hypertension    Family history of adverse reaction to anesthesia    "oldest sister, Malachi Bonds, related to brain formation at back of head; when they put her to sleep it's hard to wake her up"   GERD (gastroesophageal reflux disease)    History of blood transfusion    "related to ruptured tubal pregnancy"   Hyperlipidemia    Hypertensive heart disease    Morbid obesity (HCC)    Stroke (cerebrum) (HCC)    Tubular adenoma of colon    Type II diabetes mellitus (HCC)     SURGICAL HISTORY: Past Surgical History:  Procedure Laterality Date   ABDOMINAL HYSTERECTOMY  1988   fibroid   APPENDECTOMY  1985   BARIATRIC SURGERY  1984   BIOPSY  07/18/2020   Procedure: BIOPSY;  Surgeon: Beverley Fiedler, MD;  Location: [facility];  Service: Gastroenterology;;   CARDIAC CATHETERIZATION N/A 10/21/2015   Procedure: Right/Left Heart Cath and Coronary Angiography;  Surgeon: Tonny Bollman, MD;  Location: [facility] INVASIVE CV LAB;  Service: Cardiovascular;  Laterality:  N/A;   CHOLECYSTECTOMY OPEN  1984   COLONOSCOPY WITH PROPOFOL N/A 07/18/2020   Procedure: COLONOSCOPY WITH PROPOFOL;  Surgeon: Beverley Fiedler, MD;  Location: [facility];  Service: Gastroenterology;  Laterality: N/A;   CORONARY PRESSURE/FFR STUDY N/A 09/04/2019   Procedure: INTRAVASCULAR PRESSURE WIRE/FFR STUDY;  Surgeon: Yvonne Kendall, MD;  Location: [facility] INVASIVE CV LAB;  Service: Cardiovascular;  Laterality: N/A;   CORONARY STENT  INTERVENTION N/A 03/18/2017   Procedure: Coronary Stent Intervention;  Surgeon: Marykay Lex, MD;  Location: [facility] INVASIVE CV LAB;  Service: Cardiovascular;  Laterality: N/A;   CORONARY STENT PLACEMENT  03/18/2017   A OPTIMIZE STUDY Drug Eluting Stent (2.5 mm 18 mm - post-dilated to 2.7 mm) was successfully placed   ECTOPIC PREGNANCY SURGERY     ENDOSCOPIC MUCOSAL RESECTION N/A 01/23/2021   Procedure: ENDOSCOPIC MUCOSAL RESECTION;  Surgeon: Lemar Lofty., MD;  Location: [facility];  Service: Gastroenterology;  Laterality: N/A;   ESOPHAGOGASTRODUODENOSCOPY (EGD) WITH PROPOFOL N/A 07/18/2020   Procedure: ESOPHAGOGASTRODUODENOSCOPY (EGD) WITH PROPOFOL;  Surgeon: Beverley Fiedler, MD;  Location: [facility];  Service: Gastroenterology;  Laterality: N/A;   ESOPHAGOGASTRODUODENOSCOPY (EGD) WITH PROPOFOL N/A 01/23/2021   Procedure: ESOPHAGOGASTRODUODENOSCOPY (EGD) WITH PROPOFOL;  Surgeon: Meridee Score Netty Starring., MD;  Location: [facility];  Service: Gastroenterology;  Laterality: N/A;   EUS N/A 01/23/2021   Procedure: UPPER ENDOSCOPIC ULTRASOUND (EUS) RADIAL;  Surgeon: Lemar Lofty., MD;  Location: [facility];  Service: Gastroenterology;  Laterality: N/A;   HEMOSTASIS CLIP PLACEMENT  01/23/2021   Procedure: HEMOSTASIS CLIP PLACEMENT;  Surgeon: Lemar Lofty., MD;  Location: [facility];  Service: Gastroenterology;;   LEFT HEART CATH AND CORONARY ANGIOGRAPHY N/A 03/18/2017   Procedure: Left Heart Cath and Coronary Angiography;  Surgeon: Marykay Lex, MD;  Location: [facility] INVASIVE CV LAB;  Service: Cardiovascular;  Laterality: N/A;   LEFT HEART CATH AND CORONARY ANGIOGRAPHY N/A 09/04/2019   Procedure: LEFT HEART CATH AND CORONARY ANGIOGRAPHY;  Surgeon: Yvonne Kendall, MD;  Location: [facility] INVASIVE CV LAB;  Service: Cardiovascular;  Laterality: N/A;   POLYPECTOMY  07/18/2020   Procedure: POLYPECTOMY;  Surgeon: Beverley Fiedler, MD;  Location: [facility];  Service: Gastroenterology;;    REDUCTION MAMMAPLASTY Bilateral ~ 1976   SUBMUCOSAL LIFTING INJECTION  01/23/2021   Procedure: SUBMUCOSAL LIFTING INJECTION;  Surgeon: Lemar Lofty., MD;  Location: [facility];  Service: Gastroenterology;;   TONSILLECTOMY      SOCIAL HISTORY: Social History   Socioeconomic History   Marital status: Divorced    Spouse name: Not on file   Number of children: 3   Years of education: doctorate   Highest education level: Not on file  Occupational History   Occupation: Teacher/retired  Tobacco Use   Smoking status: Never   Smokeless tobacco: Never  Vaping Use   Vaping status: Never Used  Substance and Sexual Activity   Alcohol use: No   Drug use: No   Sexual activity: Not Currently  Other Topics Concern   Not on file  Social History Narrative   Caffeine use-yes   Regular exercise-no   Social Drivers of Health   Financial Resource Strain: Low Risk  (11/04/2023)   Overall Financial Resource Strain (CARDIA)    Difficulty of Paying Living Expenses: Not hard at all  Food Insecurity: No Food Insecurity (11/04/2023)   Hunger Vital Sign    Worried About Running Out of Food in the Last Year: Never true    Ran Out of Food in the Last Year:  Never true  Transportation Needs: No Transportation Needs (11/04/2023)   PRAPARE - Administrator, Civil Service (Medical): No    Lack of Transportation (Non-Medical): No  Physical Activity: Inactive (11/04/2023)   Exercise Vital Sign    Days of Exercise per Week: 0 days    Minutes of Exercise per Session: 0 min  Stress: No Stress Concern Present (11/04/2023)   [employer] of Occupational Health - Occupational Stress Questionnaire    Feeling of Stress : Not at all  Social Connections: Moderately Isolated (11/04/2023)   Social Connection and Isolation Panel [NHANES]    Frequency of Communication with Friends and Family: Three times a week    Frequency of Social Gatherings with Friends and Family: More than three  times a week    Attends Religious Services: More than 4 times per year    Active Member of [employer] or Organizations: No    Attends Banker Meetings: Never    Marital Status: Divorced  Catering manager Violence: Not At Risk (11/04/2023)   Humiliation, Afraid, Rape, and Kick questionnaire    Fear of Current or Ex-Partner: No    Emotionally Abused: No    Physically Abused: No    Sexually Abused: No    FAMILY HISTORY: Family History  Problem Relation Age of Onset   Diabetes Mother    Heart attack Mother 62       2007   Hypertension Mother    Hyperlipidemia Mother    Heart disease Mother    Heart attack Father    Stroke Father 103   Hypertension Father    Hyperlipidemia Father    Cancer Sister        Gastric cancer in 2013   Stomach cancer Sister        in the cavity behind the stomach   Diabetes Sister    Diabetes Sister    Diabetes Sister    Migraines Daughter        brain surgery   Migraines Daughter    Hypertension Son        weight loss corrected that   Breast cancer Neg Hx    Colon cancer Neg Hx    Inflammatory bowel disease Neg Hx    Liver disease Neg Hx    Esophageal cancer Neg Hx    Pancreatic cancer Neg Hx    Rectal cancer Neg Hx     ALLERGIES:  is allergic to iodinated contrast media and sulfa antibiotics.  MEDICATIONS:  Current Outpatient Medications  Medication Sig Dispense Refill   amLODipine (NORVASC) 10 MG tablet Take 1 tablet (10 mg total) by mouth at bedtime. 90 tablet 3   aspirin EC 81 MG tablet Take 1 tablet (81 mg total) by mouth daily. Swallow whole. 30 tablet 11   atorvastatin (LIPITOR) 80 MG tablet TAKE 1 TABLET BY MOUTH DAILY AT 6 PM. 90 tablet 3   carvedilol (COREG) 25 MG tablet TAKE 1 TABLET (25 MG TOTAL) BY MOUTH TWICE A DAY WITH MEALS 90 tablet 3   Cholecalciferol (VITAMIN D3) 50 MCG (2000 UT) TABS Take 2,000 Units by mouth daily.     clopidogrel (PLAVIX) 75 MG tablet TAKE 1 TABLET BY MOUTH EVERY DAY 90 tablet 2   doxylamine,  Sleep, (UNISOM) 25 MG tablet Take 25 mg by mouth at bedtime as needed for sleep.     Dulaglutide (TRULICITY) 3 MG/0.5ML SOAJ Inject 3 mg as directed once a week. 6 mL 0   Evolocumab (REPATHA  SURECLICK) 140 MG/ML SOAJ Inject 140 mg into the skin every 14 (fourteen) days. 6 mL 3   FARXIGA 10 MG TABS tablet Take 10 mg by mouth daily.     ferrous sulfate 325 (65 FE) MG tablet TAKE 1 TABLET BY MOUTH EVERY DAY WITH BREAKFAST 90 tablet 0   furosemide (LASIX) 40 MG tablet Take 1 tablet (40 mg total) by mouth daily. 90 tablet 3   glucose blood (ONETOUCH ULTRA) test strip 1 each by Other route 2 (two) times daily. LAST REFILL SCHEDULE PHYSICAL 200 strip 0   isosorbide mononitrate (IMDUR) 60 MG 24 hr tablet TAKE 1 TABLET BY MOUTH EVERY DAY 90 tablet 2   Magnesium 200 MG TABS Take 200 mg by mouth daily.     nitroGLYCERIN (NITROSTAT) 0.4 MG SL tablet Place 1 tablet (0.4 mg total) under the tongue every 5 (five) minutes as needed for chest pain. 25 tablet 3   ondansetron (ZOFRAN-ODT) 4 MG disintegrating tablet TAKE 1 TABLET BY MOUTH EVERY 8 HOURS AS NEEDED FOR NAUSEA AND VOMITING 30 tablet 0   pantoprazole (PROTONIX) 40 MG tablet TAKE 1 TABLET BY MOUTH EVERY DAY 90 tablet 1   spironolactone (ALDACTONE) 25 MG tablet Take 1 tablet (25 mg total) by mouth daily. 90 tablet 3   vitamin B-12 (CYANOCOBALAMIN) 500 MCG tablet Take 2 tablets (1,000 mcg total) by mouth daily.     No current facility-administered medications for this visit.    REVIEW OF SYSTEMS:   Constitutional: ( - ) fevers, ( - )  chills , ( - ) night sweats Eyes: ( - ) blurriness of vision, ( - ) double vision, ( - ) watery eyes Ears, nose, mouth, throat, and face: ( - ) mucositis, ( - ) sore throat Respiratory: ( - ) cough, ( - ) dyspnea, ( - ) wheezes Cardiovascular: ( - ) palpitation, ( - ) chest discomfort, ( + ) lower extremity swelling Gastrointestinal:  ( - ) nausea, ( - ) heartburn, ( - ) change in bowel habits Skin: ( - ) abnormal skin  rashes Lymphatics: ( - ) new lymphadenopathy, ( - ) easy bruising Neurological: ( - ) numbness, ( - ) tingling, ( - ) new weaknesses Behavioral/Psych: ( - ) mood change, ( - ) new changes  All other systems were reviewed with the patient and are negative.  PHYSICAL EXAMINATION: ECOG PERFORMANCE STATUS: 1 - Symptomatic but completely ambulatory  Vitals:   12/20/23 1506  BP: (!) 146/75  Pulse: 75  Resp: 18  Temp: 97.7 F (36.5 C)  SpO2: 100%      Filed Weights   12/20/23 1506  Weight: 272 lb 14.4 oz (123.8 kg)    GENERAL: well appearing African American female in NAD, obese SKIN: skin color, texture, turgor are normal, no rashes or significant lesions EYES: conjunctiva are pink and non-injected, sclera clear LUNGS: clear to auscultation and percussion with normal breathing effort HEART: regular rate & rhythm and no murmurs and no lower extremity edema Musculoskeletal: no cyanosis of digits and no clubbing  PSYCH: alert & oriented x 3, fluent speech NEURO: no focal motor/sensory deficits  LABORATORY DATA:  I have reviewed the data as listed    Latest Ref Rng & Units 12/20/2023    2:43 PM 11/21/2023   11:23 AM 10/09/2023   11:07 AM  CBC  WBC 4.0 - 10.5 K/uL 5.4     Hemoglobin 12.0 - 15.0 g/dL 40.9  81.1  91.4   Hematocrit  36.0 - 46.0 % 39.9     Platelets 150 - 400 K/uL 208          Latest Ref Rng & Units 12/20/2023    2:43 PM 11/21/2023   11:18 AM 10/09/2023   11:02 AM  CMP  Glucose 70 - 99 mg/dL 147  829  562   BUN 8 - 23 mg/dL 33  38  31   Creatinine 0.44 - 1.00 mg/dL 1.30  8.65  7.84   Sodium 135 - 145 mmol/L 138  137  139   Potassium 3.5 - 5.1 mmol/L 4.6  4.7  4.5   Chloride 98 - 111 mmol/L 102  99  107   CO2 22 - 32 mmol/L 29  26  24    Calcium 8.9 - 10.3 mg/dL 9.0  8.9    8.9  8.8    8.7   Total Protein 6.5 - 8.1 g/dL 6.7     Total Bilirubin 0.0 - 1.2 mg/dL 0.7     Alkaline Phos 38 - 126 U/L 98     AST 15 - 41 U/L 17     ALT 0 - 44 U/L 16       ASSESSMENT & PLAN Alicia [name] is a 78 y.o. female presenting to the clinic for follow up of microcytic anemia.   #Microcytic Anemia #Anemia in Setting of CKD  #Alpha thalassemia minor --Multifactorial in the setting of CKD, malabsorption of iron and B12 due to history of gastric bypass.  --Patient denies any signs of bleeding. She underwent colonoscopy in August 2021 and EGD in March 2022 without any evidence of active or recent bleeding.  --Last received IV venofer once weekly x 5 doses from 04/28/2021-05/26/2021. --Currently receives retacrit 20,000 units every 6 weeks through nephrology --Testing from 06/13/21 ruled out beta thalassemia or other hemoglobin variant.  PLAN: --Labs from today show improved anemia with Hgb 11.5, MCV 76.6. Iron panel shows ferritin 664, iron 95, TIBC255, saturaiton 37%.  B12 level is 2309. --Alpha thalassemia testing from 06/18/2023 confirmed alpha-thalassemia minor which can contribute to her microcytic anemia --No indication for bone marrow biopsy at this time. No need for addition B12 or iron replacement.  --Continue retacrit injections with nephrology team.  --RTC in 6 months with labs.   No orders of the defined types were placed in this encounter.   All questions were answered. The patient knows to call the clinic with any problems, questions or concerns.  I have spent a total of 30 minutes minutes of face-to-face and non-face-to-face time, preparing to see the patient, performing a medically appropriate examination, counseling and educating the patient, ordering tests, documenting clinical information in the electronic health record, and care coordination.   Georga Kaufmann PA-C Dept of Hematology and Oncology [facility] at [facility] Phone: [phone]

## 2023-12-23 ENCOUNTER — Encounter (HOSPITAL_COMMUNITY): Payer: Self-pay

## 2023-12-23 ENCOUNTER — Encounter: Payer: Self-pay | Admitting: Physician Assistant

## 2023-12-29 NOTE — Progress Notes (Deleted)
 Cardiology Office Note:  .   Date:  12/29/2023  ID:  Alicia [name], DOB [birth year], MRN [id number] PCP: Alicia Emms, NP  [facility] Providers Cardiologist:  Alicia Pates, MD {  History of Present Illness: .   Alicia [name] is a 78 y.o. female with a past medical history of CAD status post DES to the RCA 10/2015.  Repeat cardiac cath 03/18/2017 for chest pain with DES to diagonal 1, 45% mid LAD treated with POBA, prior RCA stent was patent with normal LVEF.  Other history includes hypertension, hyperlipidemia, DM2, and syncope (CT MRI and Dopplers were all normal).  Here for follow-up appointment.  Echocardiogram at that time showed normal LVEF 65 to 70%.  30-day monitor showed normal sinus rhythm with no arrhythmias in which the patient was felt to be dehydrated and that was her syncopal etiology.  Early 01/2019 patient suffered a right brain CVA treated with tPA.  Echo with normal LVEF and no source of embolus.  LDL was elevated at 139.  Continue on Plavix and aspirin therapies.  In October 2020 had some chest pain and cardiac cath was performed which revealed moderate, nonobstructive CAD.  Most severe lesion was noted to be 50 to 60% in the mid LAD and 50% in the ostial D1 which was not thought to be hemodynamically significant by DFR.  Her stents were patent D1 and proximal RCA.  Plan was optimize medical therapy.  Carvedilol was increased to 50 mg twice a day.  The patient was seen in follow-up January 2022 and since then have been doing okay.  Complain of increasing dyspnea.  Set up for Myoview which was normal and echo which was normal.  Last seen by Dr. Tenny [name] about 6 months ago and was doing okay at that time.  Had to reschedule her appointment because her husband passed away.  Denied any chest pain.  Today, she***  ROS: ***  Studies Reviewed: [name]        GATED SPECT MYO PERF W/LEXISCAN STRESS 1D 07/25/2022 Normal perfusion. LVEF 66% with normal wall motion. This is a low risk  study. No change compared to a prior study in 2006.   ECHO COMPLETE WO IMAGING ENHANCING AGENT 07/24/2022 EF 60-65, no RWMA, mild asymmetric LVH, GLS -19.7, normal RVSF, normal PASP (RVSP 26.7), mild to moderate LAE, mild MR, RA pressure 8   LEFT HEART CATH 09/04/2019 Conclusions: 1. Moderate, non-obstructive coronary artery disease.  Most severe lesions are 50-60% mid LAD and 50% ostial D1 stenoses, which are not hemodynamically significant by DFR. 2. Widely patent D1 and proximal RCA stents. 3. Normal left ventricular filling pressure.   CARDIAC TELEMETRY MONITORING-INTERPRETATION ONLY 07/31/2017 Poor quality transmission on a few strips Sinus rhythm.  No arrhythmias detected   Risk Assessment/Calculations:   {Does this patient have ATRIAL FIBRILLATION?[phone]} No BP recorded.  {Refresh Note OR Click here to enter BP  :1}***       Physical Exam:   VS:  There were no vitals taken for this visit.   Wt Readings from Last 3 Encounters:  12/20/23 272 lb 14.4 oz (123.8 kg)  11/11/23 277 lb (125.6 kg)  11/04/23 277 lb (125.6 kg)    GEN: Well nourished, well developed in no acute distress NECK: No JVD; No carotid bruits CARDIAC: ***RRR, no murmurs, rubs, gallops RESPIRATORY:  Clear to auscultation without rales, wheezing or rhonchi  ABDOMEN: Soft, non-tender, non-distended EXTREMITIES:  No edema; No deformity   ASSESSMENT AND PLAN: .  CAD HTN HLD History of CVA CKD    {Are you ordering a CV Procedure (e.g. stress test, cath, DCCV, TEE, etc)?   Press F2        :[id number]}  Dispo: ***  Signed, Alicia Dory, PA-C

## 2023-12-30 ENCOUNTER — Ambulatory Visit: Payer: Medicare PPO | Admitting: Physician Assistant

## 2023-12-30 DIAGNOSIS — N184 Chronic kidney disease, stage 4 (severe): Secondary | ICD-10-CM

## 2023-12-30 DIAGNOSIS — Z8673 Personal history of transient ischemic attack (TIA), and cerebral infarction without residual deficits: Secondary | ICD-10-CM

## 2023-12-30 DIAGNOSIS — E785 Hyperlipidemia, unspecified: Secondary | ICD-10-CM

## 2023-12-30 DIAGNOSIS — I10 Essential (primary) hypertension: Secondary | ICD-10-CM

## 2023-12-30 DIAGNOSIS — I2510 Atherosclerotic heart disease of native coronary artery without angina pectoris: Secondary | ICD-10-CM

## 2024-01-02 ENCOUNTER — Ambulatory Visit (HOSPITAL_COMMUNITY)
Admission: RE | Admit: 2024-01-02 | Discharge: 2024-01-02 | Disposition: A | Discharge status: Home / Self Care | Payer: Medicare PPO | Source: Ambulatory Visit | Attending: Nephrology | Admitting: Nephrology

## 2024-01-02 ENCOUNTER — Other Ambulatory Visit: Payer: Self-pay | Admitting: Nurse Practitioner

## 2024-01-02 ENCOUNTER — Other Ambulatory Visit: Payer: Self-pay | Admitting: Internal Medicine

## 2024-01-02 VITALS — BP 142/79 | HR 72 | Temp 97.5°F | Resp 17

## 2024-01-02 DIAGNOSIS — D508 Other iron deficiency anemias: Secondary | ICD-10-CM | POA: Insufficient documentation

## 2024-01-02 DIAGNOSIS — D509 Iron deficiency anemia, unspecified: Secondary | ICD-10-CM | POA: Insufficient documentation

## 2024-01-02 DIAGNOSIS — E119 Type 2 diabetes mellitus without complications: Secondary | ICD-10-CM

## 2024-01-02 DIAGNOSIS — K219 Gastro-esophageal reflux disease without esophagitis: Secondary | ICD-10-CM

## 2024-01-02 DIAGNOSIS — D631 Anemia in chronic kidney disease: Secondary | ICD-10-CM | POA: Insufficient documentation

## 2024-01-02 DIAGNOSIS — N184 Chronic kidney disease, stage 4 (severe): Secondary | ICD-10-CM | POA: Insufficient documentation

## 2024-01-02 DIAGNOSIS — I10 Essential (primary) hypertension: Secondary | ICD-10-CM

## 2024-01-02 LAB — RENAL FUNCTION PANEL
Albumin: 3.6 g/dL (ref 3.5–5.0)
Anion gap: 6 (ref 5–15)
BUN: 27 mg/dL — ABNORMAL HIGH (ref 8–23)
CO2: 25 mmol/L (ref 22–32)
Calcium: 8.7 mg/dL — ABNORMAL LOW (ref 8.9–10.3)
Chloride: 106 mmol/L (ref 98–111)
Creatinine, Ser: 2.54 mg/dL — ABNORMAL HIGH (ref 0.44–1.00)
GFR, Estimated: 19 mL/min — ABNORMAL LOW (ref >60)
Glucose, Bld: 183 mg/dL — ABNORMAL HIGH (ref 70–99)
Phosphorus: 4.3 mg/dL (ref 2.5–4.6)
Potassium: 4.4 mmol/L (ref 3.5–5.1)
Sodium: 137 mmol/L (ref 135–145)

## 2024-01-02 LAB — IRON AND TIBC
Iron: 81 ug/dL (ref 28–170)
Saturation Ratios: 33 % — ABNORMAL HIGH (ref 10.4–31.8)
TIBC: 246 ug/dL — ABNORMAL LOW (ref 250–450)
UIBC: 165 ug/dL

## 2024-01-02 LAB — POCT HEMOGLOBIN-HEMACUE: Hemoglobin: 10.7 g/dL — ABNORMAL LOW (ref 12.0–15.0)

## 2024-01-02 LAB — FERRITIN: Ferritin: 592 ng/mL — ABNORMAL HIGH (ref 11–307)

## 2024-01-02 MED ORDER — EPOETIN ALFA-EPBX 10000 UNIT/ML IJ SOLN
INTRAMUSCULAR | Status: AC
  Filled 2024-01-02: qty 2

## 2024-01-02 MED ORDER — EPOETIN ALFA-EPBX 10000 UNIT/ML IJ SOLN
20000.0000 [IU] | INTRAMUSCULAR | Status: DC
  Administered 2024-01-02: 20000 [IU] via SUBCUTANEOUS

## 2024-01-03 LAB — PTH, INTACT AND CALCIUM
Calcium, Total (PTH): 8.9 mg/dL (ref 8.7–10.3)
PTH: 54 pg/mL (ref 15–65)

## 2024-01-16 DIAGNOSIS — N184 Chronic kidney disease, stage 4 (severe): Secondary | ICD-10-CM | POA: Diagnosis not present

## 2024-01-16 DIAGNOSIS — N179 Acute kidney failure, unspecified: Secondary | ICD-10-CM | POA: Diagnosis not present

## 2024-01-16 DIAGNOSIS — I2510 Atherosclerotic heart disease of native coronary artery without angina pectoris: Secondary | ICD-10-CM | POA: Diagnosis not present

## 2024-01-16 DIAGNOSIS — E1122 Type 2 diabetes mellitus with diabetic chronic kidney disease: Secondary | ICD-10-CM | POA: Diagnosis not present

## 2024-01-16 DIAGNOSIS — D631 Anemia in chronic kidney disease: Secondary | ICD-10-CM | POA: Diagnosis not present

## 2024-01-16 DIAGNOSIS — I129 Hypertensive chronic kidney disease with stage 1 through stage 4 chronic kidney disease, or unspecified chronic kidney disease: Secondary | ICD-10-CM | POA: Diagnosis not present

## 2024-01-16 DIAGNOSIS — E785 Hyperlipidemia, unspecified: Secondary | ICD-10-CM | POA: Diagnosis not present

## 2024-01-31 ENCOUNTER — Encounter: Payer: Self-pay | Admitting: Pharmacist

## 2024-01-31 NOTE — Progress Notes (Signed)
 Chart Review:  Date of review: 01/31/24 Patient was identified as falling into the [facility] for Diabetes control.     Last A1c: 9.2% (11/11/23) Next A1c Due: 02/09/24 Next Scheduled Visit: 02/10/24  Fill Hx: Regular refills dispensed. Due for Repatha, Trulicity refill mid-march  Payor: HU[employer] MEDICARE / Plan: HU[employer] MEDICARE CHOICE PPO / Product Type: *No Product type* /   Rx coverage - YES  Pharmacy Recommendations:  Consider Trulicity titration to 4.0 mg weekly vs transition to agent with greater A1c-reduction (Ozempic/Mounjaro)  If cost is a barrier in 2025:  Novo PAP if income is: <$60,000 (household 1) <$80,000 (household 2) Farxiga PAP if income is: <$44,000 (household 1) <$60,000 (household 2) Repatha assistance via [employer] available if income is: <$73,000 (household 1) <$99,000 (household 2)  Future Appointments  Date Time Provider Department Center  02/10/2024 11:40 AM [name], NP [facility]  02/13/2024 10:45 AM [facility] None  05/29/2024  2:30 PM [facility] LAB [facility] None  05/29/2024  3:00 PM Georga Kaufmann T, PA-C [facility] None  11/04/2024  8:50 AM [facility] ANNUAL WELLNESS VISIT 1 [facility]

## 2024-02-10 ENCOUNTER — Ambulatory Visit: Payer: Medicare PPO | Admitting: Nurse Practitioner

## 2024-02-13 ENCOUNTER — Ambulatory Visit (HOSPITAL_COMMUNITY)
Admission: RE | Admit: 2024-02-13 | Discharge: 2024-02-13 | Disposition: A | Discharge status: Home / Self Care | Payer: Medicare PPO | Source: Ambulatory Visit | Attending: Nephrology | Admitting: Nephrology

## 2024-02-13 VITALS — BP 111/71 | HR 75 | Temp 98.5°F | Resp 17

## 2024-02-13 DIAGNOSIS — N184 Chronic kidney disease, stage 4 (severe): Secondary | ICD-10-CM | POA: Diagnosis not present

## 2024-02-13 DIAGNOSIS — D631 Anemia in chronic kidney disease: Secondary | ICD-10-CM | POA: Diagnosis not present

## 2024-02-13 DIAGNOSIS — Z79890 Hormone replacement therapy (postmenopausal): Secondary | ICD-10-CM | POA: Insufficient documentation

## 2024-02-13 DIAGNOSIS — D509 Iron deficiency anemia, unspecified: Secondary | ICD-10-CM | POA: Insufficient documentation

## 2024-02-13 DIAGNOSIS — D508 Other iron deficiency anemias: Secondary | ICD-10-CM | POA: Insufficient documentation

## 2024-02-13 LAB — RENAL FUNCTION PANEL
Albumin: 3.5 g/dL (ref 3.5–5.0)
Anion gap: 12 (ref 5–15)
BUN: 30 mg/dL — ABNORMAL HIGH (ref 8–23)
CO2: 23 mmol/L (ref 22–32)
Calcium: 8.6 mg/dL — ABNORMAL LOW (ref 8.9–10.3)
Chloride: 104 mmol/L (ref 98–111)
Creatinine, Ser: 2.47 mg/dL — ABNORMAL HIGH (ref 0.44–1.00)
GFR, Estimated: 20 mL/min — ABNORMAL LOW (ref >60)
Glucose, Bld: 135 mg/dL — ABNORMAL HIGH (ref 70–99)
Phosphorus: 4.2 mg/dL (ref 2.5–4.6)
Potassium: 4.1 mmol/L (ref 3.5–5.1)
Sodium: 139 mmol/L (ref 135–145)

## 2024-02-13 LAB — POCT HEMOGLOBIN-HEMACUE: Hemoglobin: 10.3 g/dL — ABNORMAL LOW (ref 12.0–15.0)

## 2024-02-13 LAB — IRON AND TIBC
Iron: 79 ug/dL (ref 28–170)
Saturation Ratios: 33 % — ABNORMAL HIGH (ref 10.4–31.8)
TIBC: 238 ug/dL — ABNORMAL LOW (ref 250–450)
UIBC: 159 ug/dL

## 2024-02-13 LAB — FERRITIN: Ferritin: 541 ng/mL — ABNORMAL HIGH (ref 11–307)

## 2024-02-13 MED ORDER — EPOETIN ALFA-EPBX 10000 UNIT/ML IJ SOLN
20000.0000 [IU] | INTRAMUSCULAR | Status: DC
  Administered 2024-02-13: 20000 [IU] via SUBCUTANEOUS

## 2024-02-13 MED ORDER — EPOETIN ALFA-EPBX 10000 UNIT/ML IJ SOLN
INTRAMUSCULAR | Status: AC
  Filled 2024-02-13: qty 2

## 2024-02-14 LAB — PTH, INTACT AND CALCIUM
Calcium, Total (PTH): 8.1 mg/dL — ABNORMAL LOW (ref 8.7–10.3)
PTH: 98 pg/mL — ABNORMAL HIGH (ref 15–65)

## 2024-02-17 ENCOUNTER — Ambulatory Visit: Admitting: Nurse Practitioner

## 2024-02-17 ENCOUNTER — Ambulatory Visit: Admitting: Family Medicine

## 2024-02-17 VITALS — BP 130/80 | HR 70 | Temp 97.6°F | Ht 65.0 in | Wt 276.0 lb

## 2024-02-17 DIAGNOSIS — Z7984 Long term (current) use of oral hypoglycemic drugs: Secondary | ICD-10-CM | POA: Diagnosis not present

## 2024-02-17 DIAGNOSIS — E119 Type 2 diabetes mellitus without complications: Secondary | ICD-10-CM

## 2024-02-17 DIAGNOSIS — Z7985 Long-term (current) use of injectable non-insulin antidiabetic drugs: Secondary | ICD-10-CM

## 2024-02-17 DIAGNOSIS — I10 Essential (primary) hypertension: Secondary | ICD-10-CM | POA: Diagnosis not present

## 2024-02-17 LAB — LIPID PANEL
Cholesterol: 94 mg/dL (ref 0–200)
HDL: 42.1 mg/dL (ref >39.00)
LDL Cholesterol: 40 mg/dL (ref 0–99)
NonHDL: 52.09
Total CHOL/HDL Ratio: 2
Triglycerides: 61 mg/dL (ref 0.0–149.0)
VLDL: 12.2 mg/dL (ref 0.0–40.0)

## 2024-02-17 LAB — POCT GLYCOSYLATED HEMOGLOBIN (HGB A1C): Hemoglobin A1C: 9.6 % — AB (ref 4.0–5.6)

## 2024-02-17 LAB — TSH: TSH: 0.88 u[IU]/mL (ref 0.35–5.50)

## 2024-02-17 NOTE — Assessment & Plan Note (Addendum)
 Patient currently maintained on Trulicity 3 mg once a week along with Farxiga 10 mg weekly.  Patient's A1c went up.  We discussed that we will do a serum drawl and A1c today.  If still elevated consider adding on either glimepiride or increasing Trulicity.  Patient was eating lots of fruit snacks throughout the day with dinner discussed that natural sugars are greater than processed sugars for the sugar and need to be mindful of the portion and how often she has them

## 2024-02-17 NOTE — Patient Instructions (Addendum)
 Nice to see you today I will be in touch with the labs once I have them Be mindufl of the fruit you are eating a day and at a time Follow up with me in 3 months for your physical , sooner if you need me

## 2024-02-17 NOTE — Progress Notes (Signed)
 Established Patient Office Visit  Subjective   Patient ID: Alicia [name], female    DOB: [birth year]  Age: 78 y.o. MRN: [id number]  Chief Complaint  Patient presents with   Diabetes      DM2: patinet is currenlty maintained trulicity 3mg  and farxiga 10mg  daily  States that she has been checking them every other day. She has been getting the 160 States that she is still doing the nutrisytem and nothing that is real sweet. State that the cookie is around 70 calories . She gets 3 meals a day and 3 snacks. She will have brunch and dinner. She will do one snack and try to stay under 1,200 calories  States that she did miss last weeks dose of Trulicity but that is the only time   Review of Systems  Constitutional:  Negative for chills and fever.  Respiratory:  Negative for shortness of breath.   Cardiovascular:  Negative for chest pain.  Gastrointestinal:        BM at least daily   Neurological:  Negative for headaches.  Psychiatric/Behavioral:  Negative for hallucinations and suicidal ideas.       Objective:     BP 130/80   Pulse 70   Temp 97.6 F (36.4 C) (Oral)   Ht 5\' 5"  (1.651 m)   Wt 276 lb (125.2 kg)   SpO2 98%   BMI 45.93 kg/m    Physical Exam Vitals and nursing note reviewed.  Constitutional:      Appearance: Normal appearance.  Cardiovascular:     Rate and Rhythm: Normal rate and regular rhythm.     Heart sounds: Normal heart sounds.  Pulmonary:     Effort: Pulmonary effort is normal.     Breath sounds: Normal breath sounds.  Abdominal:     General: Bowel sounds are normal.  Neurological:     Mental Status: She is alert.      Results for orders placed or performed in visit on 02/17/24  POCT glycosylated hemoglobin (Hb A1C)  Result Value Ref Range   Hemoglobin A1C 9.6 (A) 4.0 - 5.6 %   HbA1c POC (<> result, manual entry)     HbA1c, POC (prediabetic range)     HbA1c, POC (controlled diabetic range)        The ASCVD Risk score (Arnett DK, et  al., 2019) failed to calculate for the following reasons:   Risk score cannot be calculated because patient has a medical history suggesting prior/existing ASCVD    Assessment & Plan:   Problem List Items Addressed This Visit       Cardiovascular and Mediastinum   Essential hypertension (Chronic)   Relevant Orders   TSH     Endocrine   Type 2 diabetes mellitus without complication, without long-term current use of insulin (HCC) - Primary   Patient currently maintained on Trulicity 3 mg once a week along with Farxiga 10 mg weekly.  Patient's A1c went up.  We discussed that we will do a serum drawl and A1c today.  If still elevated consider adding on either glimepiride or increasing Trulicity.  Patient was eating lots of fruit snacks throughout the day with dinner discussed that natural sugars are greater than processed sugars for the sugar and need to be mindful of the portion and how often she has them         Relevant Orders   POCT glycosylated hemoglobin (Hb A1C) (Completed)   Hemoglobin A1c   Lipid panel  Return in about 3 months (around 05/18/2024) for CPE and Labs.    Audria Nine, NP

## 2024-02-18 ENCOUNTER — Encounter: Payer: Self-pay | Admitting: Nurse Practitioner

## 2024-02-18 ENCOUNTER — Telehealth: Payer: Self-pay | Admitting: Nurse Practitioner

## 2024-02-18 LAB — HEMOGLOBIN A1C: Hgb A1c MFr Bld: 10.3 % — ABNORMAL HIGH (ref 4.6–6.5)

## 2024-02-18 MED ORDER — TRULICITY 4.5 MG/0.5ML ~~LOC~~ SOAJ: 4.5000 mg | SUBCUTANEOUS | 1 refills | Status: DC

## 2024-02-18 NOTE — Telephone Encounter (Signed)
-----   Message from [facility] T sent at 02/18/2024 12:45 PM EDT ----- Called patient she is taking the Trulicity ok at current dose. No side effects. It is covered well by insurance would like new dose called in.

## 2024-03-25 ENCOUNTER — Other Ambulatory Visit: Payer: Self-pay | Admitting: Internal Medicine

## 2024-03-25 ENCOUNTER — Other Ambulatory Visit: Payer: Self-pay | Admitting: Nurse Practitioner

## 2024-03-25 DIAGNOSIS — I10 Essential (primary) hypertension: Secondary | ICD-10-CM

## 2024-03-25 DIAGNOSIS — E119 Type 2 diabetes mellitus without complications: Secondary | ICD-10-CM

## 2024-03-25 DIAGNOSIS — K219 Gastro-esophageal reflux disease without esophagitis: Secondary | ICD-10-CM

## 2024-03-26 ENCOUNTER — Ambulatory Visit (HOSPITAL_COMMUNITY)
Admission: RE | Admit: 2024-03-26 | Discharge: 2024-03-26 | Disposition: A | Discharge status: Home / Self Care | Source: Ambulatory Visit | Attending: Nephrology | Admitting: Nephrology

## 2024-03-26 VITALS — BP 122/67 | HR 71 | Temp 97.0°F | Resp 17

## 2024-03-26 DIAGNOSIS — N184 Chronic kidney disease, stage 4 (severe): Secondary | ICD-10-CM | POA: Diagnosis not present

## 2024-03-26 DIAGNOSIS — D509 Iron deficiency anemia, unspecified: Secondary | ICD-10-CM

## 2024-03-26 DIAGNOSIS — D508 Other iron deficiency anemias: Secondary | ICD-10-CM

## 2024-03-26 DIAGNOSIS — D631 Anemia in chronic kidney disease: Secondary | ICD-10-CM | POA: Insufficient documentation

## 2024-03-26 LAB — POCT HEMOGLOBIN-HEMACUE: Hemoglobin: 9.5 g/dL — ABNORMAL LOW (ref 12.0–15.0)

## 2024-03-26 LAB — IRON AND TIBC
Iron: 75 ug/dL (ref 28–170)
Saturation Ratios: 33 % — ABNORMAL HIGH (ref 10.4–31.8)
TIBC: 231 ug/dL — ABNORMAL LOW (ref 250–450)
UIBC: 156 ug/dL

## 2024-03-26 LAB — RENAL FUNCTION PANEL
Albumin: 3.4 g/dL — ABNORMAL LOW (ref 3.5–5.0)
Anion gap: 9 (ref 5–15)
BUN: 24 mg/dL — ABNORMAL HIGH (ref 8–23)
CO2: 27 mmol/L (ref 22–32)
Calcium: 8.3 mg/dL — ABNORMAL LOW (ref 8.9–10.3)
Chloride: 102 mmol/L (ref 98–111)
Creatinine, Ser: 2.47 mg/dL — ABNORMAL HIGH (ref 0.44–1.00)
GFR, Estimated: 20 mL/min — ABNORMAL LOW (ref >60)
Glucose, Bld: 138 mg/dL — ABNORMAL HIGH (ref 70–99)
Phosphorus: 3.4 mg/dL (ref 2.5–4.6)
Potassium: 3.8 mmol/L (ref 3.5–5.1)
Sodium: 138 mmol/L (ref 135–145)

## 2024-03-26 LAB — FERRITIN: Ferritin: 458 ng/mL — ABNORMAL HIGH (ref 11–307)

## 2024-03-26 MED ORDER — EPOETIN ALFA-EPBX 10000 UNIT/ML IJ SOLN
INTRAMUSCULAR | Status: AC
  Administered 2024-03-26: 20000 [IU] via SUBCUTANEOUS
  Filled 2024-03-26: qty 2

## 2024-03-26 MED ORDER — EPOETIN ALFA-EPBX 10000 UNIT/ML IJ SOLN: 20000.0000 [IU] | INTRAMUSCULAR | Status: DC

## 2024-03-27 LAB — PTH, INTACT AND CALCIUM
Calcium, Total (PTH): 8.3 mg/dL — ABNORMAL LOW (ref 8.7–10.3)
PTH: 78 pg/mL — ABNORMAL HIGH (ref 15–65)

## 2024-04-14 DIAGNOSIS — E1122 Type 2 diabetes mellitus with diabetic chronic kidney disease: Secondary | ICD-10-CM | POA: Diagnosis not present

## 2024-04-14 DIAGNOSIS — E785 Hyperlipidemia, unspecified: Secondary | ICD-10-CM | POA: Diagnosis not present

## 2024-04-14 DIAGNOSIS — I2510 Atherosclerotic heart disease of native coronary artery without angina pectoris: Secondary | ICD-10-CM | POA: Diagnosis not present

## 2024-04-14 DIAGNOSIS — I129 Hypertensive chronic kidney disease with stage 1 through stage 4 chronic kidney disease, or unspecified chronic kidney disease: Secondary | ICD-10-CM | POA: Diagnosis not present

## 2024-04-14 DIAGNOSIS — N184 Chronic kidney disease, stage 4 (severe): Secondary | ICD-10-CM | POA: Diagnosis not present

## 2024-04-14 DIAGNOSIS — N179 Acute kidney failure, unspecified: Secondary | ICD-10-CM | POA: Diagnosis not present

## 2024-04-14 DIAGNOSIS — D631 Anemia in chronic kidney disease: Secondary | ICD-10-CM | POA: Diagnosis not present

## 2024-05-07 ENCOUNTER — Ambulatory Visit (HOSPITAL_COMMUNITY)
Admission: RE | Admit: 2024-05-07 | Discharge: 2024-05-07 | Disposition: A | Discharge status: Home / Self Care | Source: Ambulatory Visit | Attending: Nephrology | Admitting: Nephrology

## 2024-05-07 VITALS — BP 116/67 | HR 74 | Temp 97.8°F | Resp 18

## 2024-05-07 DIAGNOSIS — D508 Other iron deficiency anemias: Secondary | ICD-10-CM | POA: Insufficient documentation

## 2024-05-07 DIAGNOSIS — D509 Iron deficiency anemia, unspecified: Secondary | ICD-10-CM | POA: Diagnosis not present

## 2024-05-07 DIAGNOSIS — N184 Chronic kidney disease, stage 4 (severe): Secondary | ICD-10-CM | POA: Insufficient documentation

## 2024-05-07 DIAGNOSIS — D631 Anemia in chronic kidney disease: Secondary | ICD-10-CM | POA: Insufficient documentation

## 2024-05-07 LAB — FERRITIN: Ferritin: 370 ng/mL — ABNORMAL HIGH (ref 11–307)

## 2024-05-07 LAB — RENAL FUNCTION PANEL
Albumin: 3.7 g/dL (ref 3.5–5.0)
Anion gap: 10 (ref 5–15)
BUN: 26 mg/dL — ABNORMAL HIGH (ref 8–23)
CO2: 26 mmol/L (ref 22–32)
Calcium: 9.1 mg/dL (ref 8.9–10.3)
Chloride: 104 mmol/L (ref 98–111)
Creatinine, Ser: 2.58 mg/dL — ABNORMAL HIGH (ref 0.44–1.00)
GFR, Estimated: 19 mL/min — ABNORMAL LOW (ref >60)
Glucose, Bld: 136 mg/dL — ABNORMAL HIGH (ref 70–99)
Phosphorus: 4.9 mg/dL — ABNORMAL HIGH (ref 2.5–4.6)
Potassium: 4.7 mmol/L (ref 3.5–5.1)
Sodium: 140 mmol/L (ref 135–145)

## 2024-05-07 LAB — POCT HEMOGLOBIN-HEMACUE: Hemoglobin: 9.9 g/dL — ABNORMAL LOW (ref 12.0–15.0)

## 2024-05-07 LAB — IRON AND TIBC
Iron: 55 ug/dL (ref 28–170)
Saturation Ratios: 22 % (ref 10.4–31.8)
TIBC: 251 ug/dL (ref 250–450)
UIBC: 196 ug/dL

## 2024-05-07 MED ORDER — EPOETIN ALFA-EPBX 10000 UNIT/ML IJ SOLN
20000.0000 [IU] | INTRAMUSCULAR | Status: DC
  Administered 2024-05-07: 20000 [IU] via SUBCUTANEOUS

## 2024-05-07 MED ORDER — EPOETIN ALFA-EPBX 10000 UNIT/ML IJ SOLN
INTRAMUSCULAR | Status: AC
  Filled 2024-05-07: qty 2

## 2024-05-08 LAB — PTH, INTACT AND CALCIUM
Calcium, Total (PTH): 8.8 mg/dL (ref 8.7–10.3)
PTH: 109 pg/mL — ABNORMAL HIGH (ref 15–65)

## 2024-05-11 ENCOUNTER — Telehealth: Payer: Self-pay | Admitting: Pharmacy Technician

## 2024-05-11 NOTE — Telephone Encounter (Signed)
 Auth Submission: APPROVED Site of care: Site of care: [facility] Payer: [employer] MEDICARE Medication & CPT/J Code(s) submitted: [id number] RETACRIT  Diagnosis Code:  Route of submission (phone, fax, portal):  Phone # Fax # Auth type: Buy/Bill HB Units/visits requested: 20000u q 6 weeks Reference number: [id number] Approval from: 11/20/23 to 11/18/24    Dagoberto Armour, CPhT

## 2024-05-18 ENCOUNTER — Ambulatory Visit: Admitting: Nurse Practitioner

## 2024-05-20 ENCOUNTER — Ambulatory Visit: Admitting: Nurse Practitioner

## 2024-05-20 ENCOUNTER — Encounter: Payer: Self-pay | Admitting: Nurse Practitioner

## 2024-05-20 VITALS — BP 114/62 | HR 70 | Temp 97.6°F | Ht 65.0 in | Wt 263.0 lb

## 2024-05-20 DIAGNOSIS — E785 Hyperlipidemia, unspecified: Secondary | ICD-10-CM | POA: Diagnosis not present

## 2024-05-20 DIAGNOSIS — N184 Chronic kidney disease, stage 4 (severe): Secondary | ICD-10-CM | POA: Diagnosis not present

## 2024-05-20 DIAGNOSIS — G4733 Obstructive sleep apnea (adult) (pediatric): Secondary | ICD-10-CM | POA: Diagnosis not present

## 2024-05-20 DIAGNOSIS — E119 Type 2 diabetes mellitus without complications: Secondary | ICD-10-CM | POA: Diagnosis not present

## 2024-05-20 DIAGNOSIS — Z7984 Long term (current) use of oral hypoglycemic drugs: Secondary | ICD-10-CM

## 2024-05-20 DIAGNOSIS — Z1231 Encounter for screening mammogram for malignant neoplasm of breast: Secondary | ICD-10-CM

## 2024-05-20 DIAGNOSIS — Z23 Encounter for immunization: Secondary | ICD-10-CM

## 2024-05-20 DIAGNOSIS — I10 Essential (primary) hypertension: Secondary | ICD-10-CM | POA: Diagnosis not present

## 2024-05-20 DIAGNOSIS — K219 Gastro-esophageal reflux disease without esophagitis: Secondary | ICD-10-CM | POA: Diagnosis not present

## 2024-05-20 DIAGNOSIS — D509 Iron deficiency anemia, unspecified: Secondary | ICD-10-CM

## 2024-05-20 DIAGNOSIS — Z9884 Bariatric surgery status: Secondary | ICD-10-CM

## 2024-05-20 DIAGNOSIS — I5032 Chronic diastolic (congestive) heart failure: Secondary | ICD-10-CM

## 2024-05-20 DIAGNOSIS — Z0001 Encounter for general adult medical examination with abnormal findings: Secondary | ICD-10-CM

## 2024-05-20 DIAGNOSIS — H6123 Impacted cerumen, bilateral: Secondary | ICD-10-CM | POA: Diagnosis not present

## 2024-05-20 DIAGNOSIS — Z0000 Encounter for general adult medical examination without abnormal findings: Secondary | ICD-10-CM

## 2024-05-20 LAB — MICROALBUMIN / CREATININE URINE RATIO
Creatinine,U: 99 mg/dL
Microalb Creat Ratio: 12.1 mg/g (ref 0.0–30.0)
Microalb, Ur: 1.2 mg/dL (ref 0.0–1.9)

## 2024-05-20 LAB — COMPREHENSIVE METABOLIC PANEL WITH GFR
ALT: 8 U/L (ref 0–35)
AST: 13 U/L (ref 0–37)
Albumin: 4.1 g/dL (ref 3.5–5.2)
Alkaline Phosphatase: 89 U/L (ref 39–117)
BUN: 36 mg/dL — ABNORMAL HIGH (ref 6–23)
CO2: 30 meq/L (ref 19–32)
Calcium: 9 mg/dL (ref 8.4–10.5)
Chloride: 104 meq/L (ref 96–112)
Creatinine, Ser: 2.41 mg/dL — ABNORMAL HIGH (ref 0.40–1.20)
GFR: 19.02 mL/min — ABNORMAL LOW (ref >60.00)
Glucose, Bld: 137 mg/dL — ABNORMAL HIGH (ref 70–99)
Potassium: 4.4 meq/L (ref 3.5–5.1)
Sodium: 142 meq/L (ref 135–145)
Total Bilirubin: 0.8 mg/dL (ref 0.2–1.2)
Total Protein: 6.4 g/dL (ref 6.0–8.3)

## 2024-05-20 LAB — LIPID PANEL
Cholesterol: 94 mg/dL (ref 0–200)
HDL: 38.2 mg/dL — ABNORMAL LOW (ref >39.00)
LDL Cholesterol: 44 mg/dL (ref 0–99)
NonHDL: 56.22
Total CHOL/HDL Ratio: 2
Triglycerides: 59 mg/dL (ref 0.0–149.0)
VLDL: 11.8 mg/dL (ref 0.0–40.0)

## 2024-05-20 LAB — POCT GLYCOSYLATED HEMOGLOBIN (HGB A1C): Hemoglobin A1C: 7.3 % — AB (ref 4.0–5.6)

## 2024-05-20 LAB — CBC
HCT: 33.6 % — ABNORMAL LOW (ref 36.0–46.0)
Hemoglobin: 10.6 g/dL — ABNORMAL LOW (ref 12.0–15.0)
MCHC: 31.5 g/dL (ref 30.0–36.0)
MCV: 72.2 fl — ABNORMAL LOW (ref 78.0–100.0)
Platelets: 222 10*3/uL (ref 150.0–400.0)
RBC: 4.66 Mil/uL (ref 3.87–5.11)
RDW: 17.6 % — ABNORMAL HIGH (ref 11.5–15.5)
WBC: 4.9 10*3/uL (ref 4.0–10.5)

## 2024-05-20 LAB — VITAMIN D 25 HYDROXY (VIT D DEFICIENCY, FRACTURES): VITD: 59.76 ng/mL (ref 30.00–100.00)

## 2024-05-20 LAB — TSH: TSH: 0.89 u[IU]/mL (ref 0.35–5.50)

## 2024-05-20 LAB — VITAMIN B12: Vitamin B-12: 752 pg/mL (ref 211–911)

## 2024-05-20 NOTE — Assessment & Plan Note (Signed)
 History the same with endoscopy on file.  Patient on Protonix  40 mg daily.  Continue

## 2024-05-20 NOTE — Assessment & Plan Note (Signed)
 Patient currently maintained on amlodipine  10 mg daily, isosorbide  mononitrate 60 mg daily, spironolactone  25 mg daily.  She is monitored and followed by cardiology.  Continue taking medication as prescribed follow-up specialist as recommended

## 2024-05-20 NOTE — Patient Instructions (Signed)
 Nice to see you today I am going to keep your medicines the same Follow up with me in 3 months, sooner if you need me

## 2024-05-20 NOTE — Assessment & Plan Note (Signed)
 Discussed age-appropriate immunizations and screening exams.  Did review patient's personal, surgical, social, family history.  Patient is up-to-date on all age-appropriate vaccinations she would like.  Update pneumonia vaccine today.  Patient is aged out of CRC screening for cervical cancer screening.  Order placed for mammogram for breast cancer screening.  Patient was given information at discharge about preventative healthcare maintenance with anticipatory guidance

## 2024-05-20 NOTE — Assessment & Plan Note (Signed)
 Pending vitamin D  and B12 level today

## 2024-05-20 NOTE — Assessment & Plan Note (Signed)
 Pending lipid panel history of same.  Patient currently maintained on Repatha  and followed by cardiology continue

## 2024-05-20 NOTE — Assessment & Plan Note (Signed)
 History of the same being followed at nephrology and hematology.  Patient is on oral iron  replacement

## 2024-05-20 NOTE — Assessment & Plan Note (Signed)
 History of the same she was coincidently criteria for CPAP

## 2024-05-20 NOTE — Assessment & Plan Note (Signed)
 Patient currently maintained on Farxiga 10 mg daily and Trulicity  4.5 mg daily.  A1c 7.3%.  She does have a history of hypoglycemia goal for her to be A1c under 8.  Continue medication as prescribed

## 2024-05-20 NOTE — Assessment & Plan Note (Signed)
 Verbal consent obtained.  Patient was prepped per office policy.  Bilateral ears irrigated.  Impactions were removed.  Patient tolerated procedure well

## 2024-05-20 NOTE — Progress Notes (Signed)
 +  Established Patient Office Visit  Subjective   Patient ID: Alicia [name], female    DOB: 04-May-1946  Age: 78 y.o. MRN: [id number]  Chief Complaint  Patient presents with   Annual Exam    HPI   DM2: currently on trulicity  4.5mg  weekly, farxiga 10 daily. Has not been checking sugars at home.   CHF: on carvedilol  25 mg BID, isosorbide  mononitrate 60 milligram, spironlactone 25 milligram.  Patient currently followed by cardiology  HLD: Patient currently followed by cardiology and maintained on Repatha  injection every 2 weeks  GERD: on protonix  40mg  daily.  Patient had upper endoscopic ultrasound that came back for lipoma prior to that and endoscopy on 07/19/2019  CKD: Patient is followed by nephrology.  Dr. Kellie Goldsborough  for complete physical and follow up of chronic conditions.  Immunizations: -Tetanus: Completed in 2013 -Influenza: Out of season -Shingles: Completed Shingrix series -Pneumonia: Due for Prevnar 20 updated today  Diet: Fair diet. States that she stopped nutrisystem. She is doing 3 meals a day and one snack. She is drinking coffee in the am and water  Exercise: No regular exercise.  Eye exam: needs updating   Dental exam: Needs updating   Colonoscopy: Completed in 07/18/2020, no recommended recall Lung Cancer Screening: N/A  Pap Smear: Aged out, hysterectomy  Mammogram: 04/03/2019, due for repeat in 1 year, due.  States she had a reminder letter  DEXA: 02/12/2022 that was normal repeat due  Advanced directive: Does not have either   Sleep: going to bed around 1am and get up around 7am. States that she does not feel rested. Statse that she does not qulaify for a CPAP      Review of Systems  Constitutional:  Negative for chills and fever.  Respiratory:  Negative for shortness of breath.   Cardiovascular:  Negative for chest pain and leg swelling.  Gastrointestinal:  Negative for abdominal pain, blood in stool, constipation, diarrhea, nausea and  vomiting.       BM daily  Genitourinary:  Negative for dysuria and hematuria.  Neurological:  Negative for tingling and headaches.  Psychiatric/Behavioral:  Negative for hallucinations and suicidal ideas.       Objective:     BP 114/62   Pulse 70   Temp 97.6 F (36.4 C) (Oral)   Ht 5' 5 (1.651 m)   Wt 263 lb (119.3 kg)   SpO2 99%   BMI 43.77 kg/m  BP Readings from Last 3 Encounters:  05/20/24 114/62  05/07/24 116/67  03/26/24 122/67   Wt Readings from Last 3 Encounters:  05/20/24 263 lb (119.3 kg)  02/17/24 276 lb (125.2 kg)  12/20/23 272 lb 14.4 oz (123.8 kg)   SpO2 Readings from Last 3 Encounters:  05/20/24 99%  05/07/24 100%  03/26/24 100%      Physical Exam Vitals and nursing note reviewed.  Constitutional:      Appearance: Normal appearance.  HENT:     Right Ear: Ear canal and external ear normal. There is impacted cerumen.     Left Ear: Ear canal and external ear normal. There is impacted cerumen.     Mouth/Throat:     Mouth: Mucous membranes are moist.     Pharynx: Oropharynx is clear.  Eyes:     Extraocular Movements: Extraocular movements intact.     Pupils: Pupils are equal, round, and reactive to light.  Cardiovascular:     Rate and Rhythm: Normal rate and regular rhythm.     Pulses:  Normal pulses.     Heart sounds: Normal heart sounds.  Pulmonary:     Effort: Pulmonary effort is normal.     Breath sounds: Normal breath sounds.  Abdominal:     General: Bowel sounds are normal. There is no distension.     Palpations: There is no mass.     Tenderness: There is no abdominal tenderness.     Hernia: No hernia is present.  Musculoskeletal:     Right lower leg: No edema.     Left lower leg: No edema.  Lymphadenopathy:     Cervical: No cervical adenopathy.  Skin:    General: Skin is warm.  Neurological:     General: No focal deficit present.     Mental Status: She is alert.     Deep Tendon Reflexes:     Reflex Scores:      Bicep reflexes  are 2+ on the right side and 2+ on the left side.      Patellar reflexes are 2+ on the right side and 2+ on the left side.    Comments: Bilateral upper and lower extremity strength 5/5  Psychiatric:        Mood and Affect: Mood normal.        Behavior: Behavior normal.        Thought Content: Thought content normal.        Judgment: Judgment normal.    Title   Diabetic Foot Exam - detailed Is there a history of foot ulcer?: No Is there a foot ulcer now?: No Is there swelling?: No Is there elevated skin temperature?: No Is there abnormal foot shape?: No Is there a claw toe deformity?: No Are the toenails long?: Yes Are the toenails thick?: Yes Are the toenails ingrown?: No Pulse Foot Exam completed.: Yes   Right Posterior Tibialis: Present Left posterior Tibialis: Present   Right Dorsalis Pedis: Present Left Dorsalis Pedis: Present     Sensory Foot Exam Completed.: Yes Semmes-Weinstein Monofilament Test + means has sensation and - means no sensation   R Site 6: Neg L Site 6: Neg     Image components are not supported.   Image components are not supported. Image components are not supported.  [place] Comments All sites with sensation minus site 6 on bilateral feet  Long and thick toenails  Dry skin with callus to the plantar surface on left foot      Results for orders placed or performed in visit on 05/20/24  POCT glycosylated hemoglobin (Hb A1C)  Result Value Ref Range   Hemoglobin A1C 7.3 (A) 4.0 - 5.6 %   HbA1c POC (<> result, manual entry)     HbA1c, POC (prediabetic range)     HbA1c, POC (controlled diabetic range)        The ASCVD Risk score (Arnett DK, et al., 2019) failed to calculate for the following reasons:   Risk score cannot be calculated because patient has a medical history suggesting prior/existing ASCVD    Assessment & Plan:   Problem List Items Addressed This Visit       Cardiovascular and Mediastinum   Essential  hypertension (Chronic)   Patient currently maintained on amlodipine  10 mg daily, isosorbide  mononitrate 60 mg daily, spironolactone  25 mg daily.  She is monitored and followed by cardiology.  Continue taking medication as prescribed follow-up specialist as recommended      Relevant Orders   CBC   Comprehensive metabolic panel with GFR   TSH  Lipid panel   Chronic heart failure with preserved ejection fraction (HCC)   History of the same followed by cardiology.  On beta-blocker        Respiratory   OSA (obstructive sleep apnea)   History of the same she was coincidently criteria for CPAP        Digestive   GERD (gastroesophageal reflux disease)   History the same with endoscopy on file.  Patient on Protonix  40 mg daily.  Continue        Endocrine   Type 2 diabetes mellitus without complication, without long-term current use of insulin  (HCC)   Patient currently maintained on Farxiga 10 mg daily and Trulicity  4.5 mg daily.  A1c 7.3%.  She does have a history of hypoglycemia goal for her to be A1c under 8.  Continue medication as prescribed      Relevant Orders   POCT glycosylated hemoglobin (Hb A1C) (Completed)   CBC   Vitamin B12   Comprehensive metabolic panel with GFR   Microalbumin / creatinine urine ratio   Lipid panel     Nervous and Auditory   Bilateral impacted cerumen   Verbal consent obtained.  Patient was prepped per office policy.  Bilateral ears irrigated.  Impactions were removed.  Patient tolerated procedure well      Relevant Orders   Ear Lavage     Genitourinary   CKD (chronic kidney disease) stage 4, GFR 15-29 ml/min (HCC)   History of the same currently followed by nephrology through [facility].  Continue        Other   Hyperlipidemia (Chronic)   Pending lipid panel history of same.  Patient currently maintained on Repatha  and followed by cardiology continue      History of bariatric surgery   Pending vitamin D  and B12 level today       Relevant Orders   Vitamin B12   VITAMIN D  25 Hydroxy (Vit-D Deficiency, Fractures)   Iron  deficiency anemia   History of the same being followed at nephrology and hematology.  Patient is on oral iron  replacement      Preventative health care - Primary   Discussed age-appropriate immunizations and screening exams.  Did review patient's personal, surgical, social, family history.  Patient is up-to-date on all age-appropriate vaccinations she would like.  Update pneumonia vaccine today.  Patient is aged out of CRC screening for cervical cancer screening.  Order placed for mammogram for breast cancer screening.  Patient was given information at discharge about preventative healthcare maintenance with anticipatory guidance      Relevant Orders   CBC   Comprehensive metabolic panel with GFR   TSH   Other Visit Diagnoses       Need for pneumococcal 20-valent conjugate vaccination       Relevant Orders   Pneumococcal conjugate vaccine 20-valent (Prevnar 20) (Completed)     Screening mammogram for breast cancer       Relevant Orders   MM 3D SCREENING MAMMOGRAM BILATERAL BREAST       Return in about 3 months (around 08/20/2024) for DM recheck.    Adina Crandall, NP

## 2024-05-20 NOTE — Assessment & Plan Note (Signed)
 History of the same followed by cardiology.  On beta-blocker

## 2024-05-20 NOTE — Assessment & Plan Note (Signed)
 History of the same currently followed by nephrology through [facility].  Continue

## 2024-05-25 ENCOUNTER — Ambulatory Visit: Payer: Self-pay | Admitting: Nurse Practitioner

## 2024-05-29 ENCOUNTER — Other Ambulatory Visit: Payer: Medicare PPO

## 2024-05-29 ENCOUNTER — Ambulatory Visit: Payer: Medicare PPO | Admitting: Physician Assistant

## 2024-06-18 ENCOUNTER — Ambulatory Visit (HOSPITAL_COMMUNITY)
Admission: RE | Admit: 2024-06-18 | Discharge: 2024-06-18 | Disposition: A | Discharge status: Home / Self Care | Source: Ambulatory Visit | Attending: Nephrology | Admitting: Nephrology

## 2024-06-18 VITALS — BP 133/72 | HR 67 | Temp 97.5°F | Resp 16

## 2024-06-18 DIAGNOSIS — D631 Anemia in chronic kidney disease: Secondary | ICD-10-CM | POA: Diagnosis not present

## 2024-06-18 DIAGNOSIS — N184 Chronic kidney disease, stage 4 (severe): Secondary | ICD-10-CM | POA: Insufficient documentation

## 2024-06-18 DIAGNOSIS — D509 Iron deficiency anemia, unspecified: Secondary | ICD-10-CM

## 2024-06-18 DIAGNOSIS — D508 Other iron deficiency anemias: Secondary | ICD-10-CM

## 2024-06-18 LAB — RENAL FUNCTION PANEL
Albumin: 3.6 g/dL (ref 3.5–5.0)
Anion gap: 9 (ref 5–15)
BUN: 32 mg/dL — ABNORMAL HIGH (ref 8–23)
CO2: 25 mmol/L (ref 22–32)
Calcium: 8.8 mg/dL — ABNORMAL LOW (ref 8.9–10.3)
Chloride: 106 mmol/L (ref 98–111)
Creatinine, Ser: 2.53 mg/dL — ABNORMAL HIGH (ref 0.44–1.00)
GFR, Estimated: 19 mL/min — ABNORMAL LOW (ref >60)
Glucose, Bld: 126 mg/dL — ABNORMAL HIGH (ref 70–99)
Phosphorus: 4.6 mg/dL (ref 2.5–4.6)
Potassium: 4.4 mmol/L (ref 3.5–5.1)
Sodium: 140 mmol/L (ref 135–145)

## 2024-06-18 LAB — FERRITIN: Ferritin: 433 ng/mL — ABNORMAL HIGH (ref 11–307)

## 2024-06-18 LAB — IRON AND TIBC
Iron: 62 ug/dL (ref 28–170)
Saturation Ratios: 25 % (ref 10.4–31.8)
TIBC: 253 ug/dL (ref 250–450)
UIBC: 191 ug/dL

## 2024-06-18 LAB — POCT HEMOGLOBIN-HEMACUE: Hemoglobin: 9.8 g/dL — ABNORMAL LOW (ref 12.0–15.0)

## 2024-06-18 MED ORDER — EPOETIN ALFA-EPBX 10000 UNIT/ML IJ SOLN
20000.0000 [IU] | INTRAMUSCULAR | Status: DC
  Administered 2024-06-18: 20000 [IU] via SUBCUTANEOUS

## 2024-06-18 MED ORDER — EPOETIN ALFA-EPBX 10000 UNIT/ML IJ SOLN
INTRAMUSCULAR | Status: AC
  Filled 2024-06-18: qty 2

## 2024-06-19 LAB — PTH, INTACT AND CALCIUM
Calcium, Total (PTH): 8.9 mg/dL (ref 8.7–10.3)
PTH: 103 pg/mL — ABNORMAL HIGH (ref 15–65)

## 2024-06-25 ENCOUNTER — Other Ambulatory Visit: Payer: Self-pay | Admitting: Physician Assistant

## 2024-06-25 DIAGNOSIS — D509 Iron deficiency anemia, unspecified: Secondary | ICD-10-CM

## 2024-06-26 ENCOUNTER — Inpatient Hospital Stay: Attending: Physician Assistant

## 2024-06-26 ENCOUNTER — Inpatient Hospital Stay: Admitting: Physician Assistant

## 2024-06-26 VITALS — BP 134/70 | HR 73 | Temp 97.5°F | Resp 18 | Wt 262.3 lb

## 2024-06-26 DIAGNOSIS — D631 Anemia in chronic kidney disease: Secondary | ICD-10-CM | POA: Insufficient documentation

## 2024-06-26 DIAGNOSIS — N189 Chronic kidney disease, unspecified: Secondary | ICD-10-CM | POA: Diagnosis not present

## 2024-06-26 DIAGNOSIS — D563 Thalassemia minor: Secondary | ICD-10-CM | POA: Diagnosis not present

## 2024-06-26 DIAGNOSIS — Z9884 Bariatric surgery status: Secondary | ICD-10-CM | POA: Diagnosis not present

## 2024-06-26 DIAGNOSIS — Z800 Family history of malignant neoplasm of digestive organs: Secondary | ICD-10-CM | POA: Insufficient documentation

## 2024-06-26 DIAGNOSIS — D509 Iron deficiency anemia, unspecified: Secondary | ICD-10-CM | POA: Insufficient documentation

## 2024-06-26 DIAGNOSIS — N184 Chronic kidney disease, stage 4 (severe): Secondary | ICD-10-CM | POA: Diagnosis not present

## 2024-06-26 LAB — CMP (CANCER CENTER ONLY)
ALT: 9 U/L (ref 0–44)
AST: 13 U/L — ABNORMAL LOW (ref 15–41)
Albumin: 4.1 g/dL (ref 3.5–5.0)
Alkaline Phosphatase: 95 U/L (ref 38–126)
Anion gap: 10 (ref 5–15)
BUN: 42 mg/dL — ABNORMAL HIGH (ref 8–23)
CO2: 27 mmol/L (ref 22–32)
Calcium: 8.6 mg/dL — ABNORMAL LOW (ref 8.9–10.3)
Chloride: 100 mmol/L (ref 98–111)
Creatinine: 2.81 mg/dL — ABNORMAL HIGH (ref 0.44–1.00)
GFR, Estimated: 17 mL/min — ABNORMAL LOW (ref >60)
Glucose, Bld: 141 mg/dL — ABNORMAL HIGH (ref 70–99)
Potassium: 4.4 mmol/L (ref 3.5–5.1)
Sodium: 137 mmol/L (ref 135–145)
Total Bilirubin: 0.6 mg/dL (ref 0.0–1.2)
Total Protein: 6.7 g/dL (ref 6.5–8.1)

## 2024-06-26 LAB — CBC WITH DIFFERENTIAL (CANCER CENTER ONLY)
Abs Immature Granulocytes: 0.01 K/uL (ref 0.00–0.07)
Basophils Absolute: 0 K/uL (ref 0.0–0.1)
Basophils Relative: 1 %
Eosinophils Absolute: 0.1 K/uL (ref 0.0–0.5)
Eosinophils Relative: 3 %
HCT: 35.7 % — ABNORMAL LOW (ref 36.0–46.0)
Hemoglobin: 10.7 g/dL — ABNORMAL LOW (ref 12.0–15.0)
Immature Granulocytes: 0 %
Lymphocytes Relative: 38 %
Lymphs Abs: 1.7 K/uL (ref 0.7–4.0)
MCH: 22.4 pg — ABNORMAL LOW (ref 26.0–34.0)
MCHC: 30 g/dL (ref 30.0–36.0)
MCV: 74.7 fL — ABNORMAL LOW (ref 80.0–100.0)
Monocytes Absolute: 0.4 K/uL (ref 0.1–1.0)
Monocytes Relative: 10 %
Neutro Abs: 2.1 K/uL (ref 1.7–7.7)
Neutrophils Relative %: 48 %
Platelet Count: 238 K/uL (ref 150–400)
RBC: 4.78 MIL/uL (ref 3.87–5.11)
RDW: 17.5 % — ABNORMAL HIGH (ref 11.5–15.5)
WBC Count: 4.4 K/uL (ref 4.0–10.5)
nRBC: 0 % (ref 0.0–0.2)

## 2024-06-28 ENCOUNTER — Encounter (HOSPITAL_COMMUNITY): Payer: Self-pay

## 2024-06-28 ENCOUNTER — Encounter: Payer: Self-pay | Admitting: Physician Assistant

## 2024-06-28 NOTE — Progress Notes (Signed)
 [facility] [phone]   [phone]  HEMATOLOGY AND ONCOLOGY PROGRESS NOTE  Patient Care Team: Wendee Lynwood HERO, NP as PCP - General (Pain Medicine) Okey Vina GAILS, MD as PCP - Cardiology (Cardiology) Okey Vina GAILS, MD as Consulting Physician (Cardiology) Pa, [facility]  Hematological/Oncological History #Microcytic Anemia #Anemia in Setting of CKD  -07/18/2020: Colonoscopy: one 5 mm polyp in descending colon. Diverticulosis in sigmoid colon and ascending colon.   -11/22/2020: WBC 4.5, Hgb 10.1 (L), MCV 69.6, Plt 235  -11/30/2020: Vitamin B12 186 (L), Retic Ct Pct 1.9%, Abs Retic 87,780 (H), Iron  62, Transferrin 219, iron  saturation 20.2% (L), Ferritin 70.4, Folate 12.3. Started vitamin B12 2000 mcg per day.   -01/23/2021:EGD/EUS: Previous surgical intervention-query mini-gastric pouch found in the cardia. Submucosal nodule in the duodenum bulb removed via EMR. Pathology was consistent with polypoid dudoenal tissue showing nodular, mature adipose tissue within the submucosa, consistent with a lipoma. Negative for dysplasia or malignancy.  -03/29/2021: WBC 5.6, Hgb 9.2 (L), MCV 68.6 (L), Plt 251, Ferritin 56.6  -04/04/2021: Establish care with Johnston Police PA-C  04/28/2021-05/26/2021: Received IV venofer  x 5 doses.   10/23/2023-10/30/2023: Received IV feraheme  510 mg x 2 doses  HISTORY OF PRESENTING ILLNESS:  Alicia [name] 78 y.o. female returns for a follow up for microcytic anemia.  She was last seen on 12/19/2023.  In the interim since her last visit she has had no major changes in her health.  On exam today Alicia [name] reports she is doing well without any new changes in her health. Her energy and appetite are overall stable. She is able to complete her ADLs on her own. She denies nausea, vomiting or bowel habit changes. She denies easy bruising or signs of bleeding. She denies fevers, chills, sweats, chest pain, cough, nausea,  vomiting, diarrhea or constipation. She has no other complaints. Rest of the 10 point ROS is below.   MEDICAL HISTORY:  Past Medical History:  Diagnosis Date   Anemia    Arthritis    right knee (10/21/2015)   CAD (coronary artery disease)    a. Cath 10/21/15: s/p DES to RCA: moderate diffuse stenosis of mod diagonal, diffuse irregularity LCx and LAD. 2D echo 10/21/15: mod LVH, EF 60-65%, no RWMA, calcified MV, mod-severe LAE. 03/18/17 PCI with DES--> 1 diag   Cataract    CKD (chronic kidney disease), stage IV (HCC)    Diabetes (HCC)    Essential hypertension    Family history of adverse reaction to anesthesia    oldest sister, Meade, related to brain formation at back of head; when they put her to sleep it's hard to wake her up   GERD (gastroesophageal reflux disease)    History of blood transfusion    related to ruptured tubal pregnancy   Hyperlipidemia    Hypertensive heart disease    Morbid obesity (HCC)    Stroke (cerebrum) (HCC)    Tubular adenoma of colon    Type II diabetes mellitus (HCC)     SURGICAL HISTORY: Past Surgical History:  Procedure Laterality Date   ABDOMINAL HYSTERECTOMY  1988   fibroid   APPENDECTOMY  1985   BARIATRIC SURGERY  1984   BIOPSY  07/18/2020   Procedure: BIOPSY;  Surgeon: Albertus Gordy HERO, MD;  Location: [facility];  Service: Gastroenterology;;   CARDIAC CATHETERIZATION N/A 10/21/2015   Procedure: Right/Left Heart Cath and Coronary Angiography;  Surgeon: Ozell Fell, MD;  Location: [facility] INVASIVE CV LAB;  Service: Cardiovascular;  Laterality: N/A;   CHOLECYSTECTOMY OPEN  1984   COLONOSCOPY WITH PROPOFOL  N/A 07/18/2020   Procedure: COLONOSCOPY WITH PROPOFOL ;  Surgeon: Albertus Gordy HERO, MD;  Location: [facility];  Service: Gastroenterology;  Laterality: N/A;   CORONARY PRESSURE/FFR STUDY N/A 09/04/2019   Procedure: INTRAVASCULAR PRESSURE WIRE/FFR STUDY;  Surgeon: Mady Bruckner, MD;  Location: [facility] INVASIVE CV LAB;  Service: Cardiovascular;   Laterality: N/A;   CORONARY STENT INTERVENTION N/A 03/18/2017   Procedure: Coronary Stent Intervention;  Surgeon: Alm LELON Clay, MD;  Location: [facility] INVASIVE CV LAB;  Service: Cardiovascular;  Laterality: N/A;   CORONARY STENT PLACEMENT  03/18/2017   A OPTIMIZE STUDY Drug Eluting Stent (2.5 mm 18 mm - post-dilated to 2.7 mm) was successfully placed   ECTOPIC PREGNANCY SURGERY     ENDOSCOPIC MUCOSAL RESECTION N/A 01/23/2021   Procedure: ENDOSCOPIC MUCOSAL RESECTION;  Surgeon: Wilhelmenia Aloha Raddle., MD;  Location: [facility];  Service: Gastroenterology;  Laterality: N/A;   ESOPHAGOGASTRODUODENOSCOPY (EGD) WITH PROPOFOL  N/A 07/18/2020   Procedure: ESOPHAGOGASTRODUODENOSCOPY (EGD) WITH PROPOFOL ;  Surgeon: Albertus Gordy HERO, MD;  Location: [facility];  Service: Gastroenterology;  Laterality: N/A;   ESOPHAGOGASTRODUODENOSCOPY (EGD) WITH PROPOFOL  N/A 01/23/2021   Procedure: ESOPHAGOGASTRODUODENOSCOPY (EGD) WITH PROPOFOL ;  Surgeon: Wilhelmenia Aloha Raddle., MD;  Location: [facility];  Service: Gastroenterology;  Laterality: N/A;   EUS N/A 01/23/2021   Procedure: UPPER ENDOSCOPIC ULTRASOUND (EUS) RADIAL;  Surgeon: Wilhelmenia Aloha Raddle., MD;  Location: [facility];  Service: Gastroenterology;  Laterality: N/A;   HEMOSTASIS CLIP PLACEMENT  01/23/2021   Procedure: HEMOSTASIS CLIP PLACEMENT;  Surgeon: Wilhelmenia Aloha Raddle., MD;  Location: [facility];  Service: Gastroenterology;;   LEFT HEART CATH AND CORONARY ANGIOGRAPHY N/A 03/18/2017   Procedure: Left Heart Cath and Coronary Angiography;  Surgeon: Alm LELON Clay, MD;  Location: [facility] INVASIVE CV LAB;  Service: Cardiovascular;  Laterality: N/A;   LEFT HEART CATH AND CORONARY ANGIOGRAPHY N/A 09/04/2019   Procedure: LEFT HEART CATH AND CORONARY ANGIOGRAPHY;  Surgeon: Mady Bruckner, MD;  Location: [facility] INVASIVE CV LAB;  Service: Cardiovascular;  Laterality: N/A;   POLYPECTOMY  07/18/2020   Procedure: POLYPECTOMY;  Surgeon: Albertus Gordy HERO, MD;  Location: [facility];  Service: Gastroenterology;;   REDUCTION MAMMAPLASTY Bilateral ~ 1976   SUBMUCOSAL LIFTING INJECTION  01/23/2021   Procedure: SUBMUCOSAL LIFTING INJECTION;  Surgeon: Wilhelmenia Aloha Raddle., MD;  Location: [facility];  Service: Gastroenterology;;   TONSILLECTOMY      SOCIAL HISTORY: Social History   Socioeconomic History   Marital status: Divorced    Spouse name: Not on file   Number of children: 3   Years of education: doctorate   Highest education level: Not on file  Occupational History   Occupation: Teacher/retired  Tobacco Use   Smoking status: Never   Smokeless tobacco: Never  Vaping Use   Vaping status: Never Used  Substance and Sexual Activity   Alcohol use: No   Drug use: No   Sexual activity: Not Currently  Other Topics Concern   Not on file  Social History Narrative   Caffeine use-yes   Regular exercise-no   Social Drivers of Health   Financial Resource Strain: Low Risk  (11/04/2023)   Overall Financial Resource Strain (CARDIA)    Difficulty of Paying Living Expenses: Not hard at all  Food Insecurity: No Food Insecurity (11/04/2023)   Hunger Vital Sign    Worried About Running Out of Food in the Last Year: Never true    Ran Out of Food  in the Last Year: Never true  Transportation Needs: No Transportation Needs (11/04/2023)   PRAPARE - Administrator, Civil Service (Medical): No    Lack of Transportation (Non-Medical): No  Physical Activity: Inactive (11/04/2023)   Exercise Vital Sign    Days of Exercise per Week: 0 days    Minutes of Exercise per Session: 0 min  Stress: No Stress Concern Present (11/04/2023)   [employer] of Occupational Health - Occupational Stress Questionnaire    Feeling of Stress : Not at all  Social Connections: Moderately Isolated (11/04/2023)   Social Connection and Isolation Panel    Frequency of Communication with Friends and Family: Three times a week    Frequency of Social Gatherings with  Friends and Family: More than three times a week    Attends Religious Services: More than 4 times per year    Active Member of [employer] or Organizations: No    Attends Banker Meetings: Never    Marital Status: Divorced  Catering manager Violence: Not At Risk (11/04/2023)   Humiliation, Afraid, Rape, and Kick questionnaire    Fear of Current or Ex-Partner: No    Emotionally Abused: No    Physically Abused: No    Sexually Abused: No    FAMILY HISTORY: Family History  Problem Relation Age of Onset   Diabetes Mother    Heart attack Mother 65       2007   Hypertension Mother    Hyperlipidemia Mother    Heart disease Mother    Heart attack Father    Stroke Father 21   Hypertension Father    Hyperlipidemia Father    Cancer Sister        Gastric cancer in 2013   Stomach cancer Sister        in the cavity behind the stomach   Diabetes Sister    Diabetes Sister    Diabetes Sister    Migraines Daughter        brain surgery   Migraines Daughter    Hypertension Son        weight loss corrected that   Breast cancer Neg Hx    Colon cancer Neg Hx    Inflammatory bowel disease Neg Hx    Liver disease Neg Hx    Esophageal cancer Neg Hx    Pancreatic cancer Neg Hx    Rectal cancer Neg Hx     ALLERGIES:  is allergic to iodinated contrast media and sulfa antibiotics.  MEDICATIONS:  Current Outpatient Medications  Medication Sig Dispense Refill   amLODipine  (NORVASC ) 10 MG tablet TAKE 1 TABLET BY MOUTH EVERYDAY AT BEDTIME 90 tablet 1   aspirin  EC 81 MG tablet Take 1 tablet (81 mg total) by mouth daily. Swallow whole. 30 tablet 11   atorvastatin  (LIPITOR ) 80 MG tablet TAKE 1 TABLET BY MOUTH DAILY AT 6 PM. 90 tablet 3   carvedilol  (COREG ) 25 MG tablet TAKE 1 TABLET (25 MG TOTAL) BY MOUTH TWICE A DAY WITH MEALS 180 tablet 1   Cholecalciferol  (VITAMIN D3) 50 MCG (2000 UT) TABS Take 2,000 Units by mouth daily.     clopidogrel  (PLAVIX ) 75 MG tablet TAKE 1 TABLET BY MOUTH EVERY  DAY 90 tablet 2   doxylamine , Sleep, (UNISOM ) 25 MG tablet Take 25 mg by mouth at bedtime as needed for sleep.     Dulaglutide  (TRULICITY ) 4.5 MG/0.5ML SOAJ Inject 4.5 mg as directed once a week. 9 mL 1   Evolocumab  (  REPATHA  SURECLICK) 140 MG/ML SOAJ Inject 140 mg into the skin every 14 (fourteen) days. 6 mL 3   FARXIGA 10 MG TABS tablet Take 10 mg by mouth daily.     ferrous sulfate  325 (65 FE) MG tablet TAKE 1 TABLET BY MOUTH EVERY DAY WITH BREAKFAST 90 tablet 0   furosemide  (LASIX ) 40 MG tablet Take 1 tablet (40 mg total) by mouth daily. 90 tablet 3   glucose blood (ONETOUCH ULTRA) test strip 1 each by Other route 2 (two) times daily. LAST REFILL SCHEDULE PHYSICAL 200 strip 0   isosorbide  mononitrate (IMDUR ) 60 MG 24 hr tablet TAKE 1 TABLET BY MOUTH EVERY DAY 90 tablet 2   Magnesium  200 MG TABS Take 200 mg by mouth daily.     nitroGLYCERIN  (NITROSTAT ) 0.4 MG SL tablet Place 1 tablet (0.4 mg total) under the tongue every 5 (five) minutes as needed for chest pain. 25 tablet 3   ondansetron  (ZOFRAN -ODT) 4 MG disintegrating tablet TAKE 1 TABLET BY MOUTH EVERY 8 HOURS AS NEEDED FOR NAUSEA AND VOMITING 30 tablet 0   pantoprazole  (PROTONIX ) 40 MG tablet TAKE 1 TABLET BY MOUTH EVERY DAY 90 tablet 1   spironolactone  (ALDACTONE ) 25 MG tablet TAKE 1 TABLET (25 MG TOTAL) BY MOUTH DAILY. 90 tablet 0   vitamin B-12 (CYANOCOBALAMIN ) 500 MCG tablet Take 2 tablets (1,000 mcg total) by mouth daily.     No current facility-administered medications for this visit.    REVIEW OF SYSTEMS:   Constitutional: ( - ) fevers, ( - )  chills , ( - ) night sweats Eyes: ( - ) blurriness of vision, ( - ) double vision, ( - ) watery eyes Ears, nose, mouth, throat, and face: ( - ) mucositis, ( - ) sore throat Respiratory: ( - ) cough, ( - ) dyspnea, ( - ) wheezes Cardiovascular: ( - ) palpitation, ( - ) chest discomfort, ( + ) lower extremity swelling Gastrointestinal:  ( - ) nausea, ( - ) heartburn, ( - ) change in bowel  habits Skin: ( - ) abnormal skin rashes Lymphatics: ( - ) new lymphadenopathy, ( - ) easy bruising Neurological: ( - ) numbness, ( - ) tingling, ( - ) new weaknesses Behavioral/Psych: ( - ) mood change, ( - ) new changes  All other systems were reviewed with the patient and are negative.  PHYSICAL EXAMINATION: ECOG PERFORMANCE STATUS: 1 - Symptomatic but completely ambulatory  Vitals:   06/26/24 1502  BP: 134/70  Pulse: 73  Resp: 18  Temp: (!) 97.5 F (36.4 C)  SpO2: 98%      Filed Weights   06/26/24 1502  Weight: 262 lb 4.8 oz (119 kg)    GENERAL: well appearing African American female in NAD, obese SKIN: skin color, texture, turgor are normal, no rashes or significant lesions EYES: conjunctiva are pink and non-injected, sclera clear LUNGS: clear to auscultation and percussion with normal breathing effort HEART: regular rate & rhythm and no murmurs and no lower extremity edema Musculoskeletal: no cyanosis of digits and no clubbing  PSYCH: alert & oriented x 3, fluent speech NEURO: no focal motor/sensory deficits  LABORATORY DATA:  I have reviewed the data as listed    Latest Ref Rng & Units 06/26/2024    2:50 PM 06/18/2024   10:42 AM 05/20/2024   12:25 PM  CBC  WBC 4.0 - 10.5 K/uL 4.4   4.9   Hemoglobin 12.0 - 15.0 g/dL 89.2  9.8  89.3  Hematocrit 36.0 - 46.0 % 35.7   33.6   Platelets 150 - 400 K/uL 238   222.0        Latest Ref Rng & Units 06/26/2024    2:50 PM 06/18/2024   10:38 AM 05/20/2024   12:25 PM  CMP  Glucose 70 - 99 mg/dL 858  873  862   BUN 8 - 23 mg/dL 42  32  36   Creatinine 0.44 - 1.00 mg/dL 7.18  7.46  7.58   Sodium 135 - 145 mmol/L 137  140  142   Potassium 3.5 - 5.1 mmol/L 4.4  4.4  4.4   Chloride 98 - 111 mmol/L 100  106  104   CO2 22 - 32 mmol/L 27  25  30    Calcium  8.9 - 10.3 mg/dL 8.6  8.8    8.9  9.0   Total Protein 6.5 - 8.1 g/dL 6.7   6.4   Total Bilirubin 0.0 - 1.2 mg/dL 0.6   0.8   Alkaline Phos 38 - 126 U/L 95   89   AST 15 - 41  U/L 13   13   ALT 0 - 44 U/L 9   8    ASSESSMENT & PLAN Alicia [name] is a 78 y.o. female presenting to the clinic for follow up of microcytic anemia.   #Microcytic Anemia #Anemia in Setting of CKD  #Alpha thalassemia minor --Multifactorial in the setting of CKD, malabsorption of iron  and B12 due to history of gastric bypass.  --Patient denies any signs of bleeding. She underwent colonoscopy in August 2021 and EGD in March 2022 without any evidence of active or recent bleeding.  --Last received IV venofer  once weekly x 5 doses from 04/28/2021-05/26/2021. --Currently receives retacrit  20,000 units every 6 weeks through nephrology --Testing from 06/13/21 ruled out beta thalassemia or other hemoglobin variant.  --Alpha thalassemia testing from 06/18/2023 confirmed alpha-thalassemia minor which can contribute to her microcytic anemia PLAN: --Labs from today show stable anemia with Hgb between 9-10.  -- Iron  panel from 06/18/2024 showed no deficiency with iron  62, saturation 25%, ferritin 433.  --No indication for bone marrow biopsy or IV iron  infusion at this time.  --Continue retacrit  injections q 6 weeks with nephrology team.  --RTC in 6 months with labs.   No orders of the defined types were placed in this encounter.   All questions were answered. The patient knows to call the clinic with any problems, questions or concerns.  I have spent a total of 25 minutes minutes of face-to-face and non-face-to-face time, preparing to see the patient, performing a medically appropriate examination, counseling and educating the patient, ordering tests, documenting clinical information in the electronic health record, and care coordination.   Johnston Police PA-C Dept of Hematology and Oncology [facility] Phone: [phone]

## 2024-07-05 ENCOUNTER — Other Ambulatory Visit: Payer: Self-pay | Admitting: Nurse Practitioner

## 2024-07-05 DIAGNOSIS — K219 Gastro-esophageal reflux disease without esophagitis: Secondary | ICD-10-CM

## 2024-07-10 DIAGNOSIS — I129 Hypertensive chronic kidney disease with stage 1 through stage 4 chronic kidney disease, or unspecified chronic kidney disease: Secondary | ICD-10-CM | POA: Diagnosis not present

## 2024-07-10 DIAGNOSIS — N184 Chronic kidney disease, stage 4 (severe): Secondary | ICD-10-CM | POA: Diagnosis not present

## 2024-07-10 DIAGNOSIS — D631 Anemia in chronic kidney disease: Secondary | ICD-10-CM | POA: Diagnosis not present

## 2024-07-10 DIAGNOSIS — I2510 Atherosclerotic heart disease of native coronary artery without angina pectoris: Secondary | ICD-10-CM | POA: Diagnosis not present

## 2024-07-10 DIAGNOSIS — E785 Hyperlipidemia, unspecified: Secondary | ICD-10-CM | POA: Diagnosis not present

## 2024-07-10 DIAGNOSIS — E1122 Type 2 diabetes mellitus with diabetic chronic kidney disease: Secondary | ICD-10-CM | POA: Diagnosis not present

## 2024-07-27 ENCOUNTER — Other Ambulatory Visit (HOSPITAL_COMMUNITY): Payer: Self-pay | Admitting: Nephrology

## 2024-07-27 DIAGNOSIS — D631 Anemia in chronic kidney disease: Secondary | ICD-10-CM | POA: Insufficient documentation

## 2024-07-30 ENCOUNTER — Inpatient Hospital Stay (HOSPITAL_COMMUNITY)
Admission: RE | Admit: 2024-07-30 | Discharge: 2024-07-30 | Disposition: A | Discharge status: Home / Self Care | Source: Ambulatory Visit | Attending: Nephrology

## 2024-07-30 VITALS — BP 138/76 | HR 72 | Temp 98.6°F | Resp 16

## 2024-07-30 DIAGNOSIS — N184 Chronic kidney disease, stage 4 (severe): Secondary | ICD-10-CM | POA: Diagnosis not present

## 2024-07-30 DIAGNOSIS — D631 Anemia in chronic kidney disease: Secondary | ICD-10-CM | POA: Insufficient documentation

## 2024-07-30 LAB — RENAL FUNCTION PANEL
Albumin: 3.6 g/dL (ref 3.5–5.0)
Anion gap: 11 (ref 5–15)
BUN: 33 mg/dL — ABNORMAL HIGH (ref 8–23)
CO2: 25 mmol/L (ref 22–32)
Calcium: 8.6 mg/dL — ABNORMAL LOW (ref 8.9–10.3)
Chloride: 102 mmol/L (ref 98–111)
Creatinine, Ser: 2.47 mg/dL — ABNORMAL HIGH (ref 0.44–1.00)
GFR, Estimated: 20 mL/min — ABNORMAL LOW (ref >60)
Glucose, Bld: 162 mg/dL — ABNORMAL HIGH (ref 70–99)
Phosphorus: 4.9 mg/dL — ABNORMAL HIGH (ref 2.5–4.6)
Potassium: 4 mmol/L (ref 3.5–5.1)
Sodium: 138 mmol/L (ref 135–145)

## 2024-07-30 LAB — FERRITIN: Ferritin: 362 ng/mL — ABNORMAL HIGH (ref 11–307)

## 2024-07-30 LAB — IRON AND TIBC
Iron: 69 ug/dL (ref 28–170)
Saturation Ratios: 27 % (ref 10.4–31.8)
TIBC: 256 ug/dL (ref 250–450)
UIBC: 187 ug/dL

## 2024-07-30 MED ORDER — EPOETIN ALFA-EPBX 20000 UNIT/ML IJ SOLN
INTRAMUSCULAR | Status: AC
  Filled 2024-07-30: qty 1

## 2024-07-30 MED ORDER — EPOETIN ALFA-EPBX 20000 UNIT/ML IJ SOLN
20000.0000 [IU] | Freq: Once | INTRAMUSCULAR | Status: AC
  Administered 2024-07-30: 20000 [IU] via SUBCUTANEOUS

## 2024-07-31 LAB — POCT HEMOGLOBIN-HEMACUE: Hemoglobin: 10.7 g/dL — ABNORMAL LOW (ref 12.0–15.0)

## 2024-07-31 LAB — PTH, INTACT AND CALCIUM
Calcium, Total (PTH): 8.6 mg/dL — ABNORMAL LOW (ref 8.7–10.3)
PTH: 102 pg/mL — ABNORMAL HIGH (ref 15–65)

## 2024-08-02 ENCOUNTER — Other Ambulatory Visit: Payer: Self-pay | Admitting: Internal Medicine

## 2024-08-08 ENCOUNTER — Other Ambulatory Visit: Payer: Self-pay | Admitting: Internal Medicine

## 2024-08-09 ENCOUNTER — Other Ambulatory Visit: Payer: Self-pay | Admitting: Internal Medicine

## 2024-08-18 ENCOUNTER — Ambulatory Visit: Payer: Self-pay

## 2024-08-18 NOTE — Telephone Encounter (Signed)
 Noted. I appreciate Dr Brayton Layman evaluation

## 2024-08-18 NOTE — Telephone Encounter (Signed)
 Scheduled with Dr. Watt for injection.  FYI Only or Action Required?: FYI only for provider.  Patient was last seen in primary care on 05/20/2024 by Wendee Lynwood HERO, NP.  Called Nurse Triage reporting Knee Pain.  Symptoms began several years ago.  Interventions attempted: Rest, hydration, or home remedies.  Symptoms are: gradually worsening.  Triage Disposition: See PCP When Office is Open (Within 3 Days)  Patient/caregiver understands and will follow disposition?: Yes Reason for Disposition  [1] MODERATE pain (e.g., interferes with normal activities, limping) AND [2] present > 3 days  Answer Assessment - Initial Assessment Questions Chronic left knee pain, worse x 1 week. States due for injection.   1. LOCATION and RADIATION: Where is the pain located?      Left knee  2. SEVERITY: How bad is the pain? What does it keep you from doing?   (Scale 1-10; or mild, moderate, severe)     Severe when standing  3. ONSET: When did the pain start? Does it come and go, or is it there all the time?     Worse x 1 week, chronic x years  4. AGGRAVATING FACTORS: What makes the knee pain worse? (e.g., walking, climbing stairs, running)     Standing/walking  Protocols used: Knee Pain-A-AH Copied from CRM #[id number]. Topic: Clinical - Red Word Triage >> Aug 18, 2024  9:26 AM Carlyon D wrote: Red Word that prompted transfer to Nurse Triage: Left knee pain pt gets knee injections for pain denied scheduling due tot he pain.

## 2024-08-19 ENCOUNTER — Ambulatory Visit: Admitting: Family Medicine

## 2024-08-19 ENCOUNTER — Encounter: Payer: Self-pay | Admitting: Family Medicine

## 2024-08-19 VITALS — BP 118/66 | HR 50 | Temp 99.3°F | Ht 65.0 in

## 2024-08-19 DIAGNOSIS — M1712 Unilateral primary osteoarthritis, left knee: Secondary | ICD-10-CM | POA: Diagnosis not present

## 2024-08-19 MED ORDER — TRIAMCINOLONE ACETONIDE 40 MG/ML IJ SUSP
40.0000 mg | Freq: Once | INTRAMUSCULAR | Status: AC
  Administered 2024-08-19: 40 mg via INTRA_ARTICULAR

## 2024-08-19 NOTE — Progress Notes (Signed)
 Clifford Coudriet T. Lelon Ikard, MD, CAQ Sports Medicine [facility] at [facility] [address] [place], [address]  Phone: [phone]  FAX: [phone]  Alicia [name] - 78 y.o. female  MRN [id number]  Date of Birth: [birth year]  Date: 08/19/2024  PCP: Wendee Lynwood HERO, NP  Referral: Wendee Lynwood HERO, NP  Chief Complaint  Patient presents with   Knee Pain    Left/Injection   Subjective:   Alicia [name] is a 78 y.o. very pleasant female patient with Body mass index is 43.65 kg/m. who presents with the following:  Discussed the use of AI scribe software for clinical note transcription with the patient, who gave verbal consent to proceed.  History of Present Illness Alicia [name] is a 78 year old female with osteoarthritis who presents with acute exacerbation of left knee pain.  She experiences severe pain in her left knee, primarily around the joint line and behind the kneecap, which has been previously diagnosed as having end-stage arthritis with bone-on-bone contact. The pain is described as being all over the knee.  Approximately ten months ago, she received a joint injection that provided relief at that time. She has not undergone any knee surgery on the affected side.  She uses a cane for mobility, which she acquired from an [employer].    Review of Systems is noted in the HPI, as appropriate  Objective:   BP 118/66   Pulse (!) 50   Temp 99.3 F (37.4 C) (Temporal)   Ht 5' 5 (1.651 m)   SpO2 98%   BMI 43.65 kg/m   GEN: No acute distress; alert,appropriate. PULM: Breathing comfortably in no respiratory distress PSYCH: Normally interactive.  Notable medial and lateral joint line tenderness  Laboratory and Imaging Data:  Assessment and Plan:     ICD-10-CM   1. Primary osteoarthritis of left knee  M17.12 triamcinolone  acetonide (KENALOG -40) injection 40 mg     Procedure only Assessment & Plan Acute on chronic left knee  osteoarthritis with exacerbation End-stage arthritis with bone-on-bone contact behind the patella and moderate arthritis in joint lines. Previous joint injection.  - Administer intraarticular steroid injection to the left knee.  Aspiration/Injection Procedure Note Alicia [name] 07-26-1946 Date of procedure: 08/19/2024  Procedure: Large Joint Aspiration / Injection of Knee, L Indications: Pain  Procedure Details Patient verbally consented to procedure. Risks, benefits, and alternatives explained. Sterilely prepped with Chloraprep. Ethyl cholride used for anesthesia. 9 cc Lidocaine  1% mixed with 1 mL of Kenalog  40 mg injected using the anteromedial approach without difficulty. No complications with procedure and tolerated well. Patient had decreased pain post-injection. Medication: 1 mL of Kenalog  40 mg   Medication Management during today's office visit: Meds ordered this encounter  Medications   triamcinolone  acetonide (KENALOG -40) injection 40 mg   There are no discontinued medications.  Orders placed today for conditions managed today: No orders of the defined types were placed in this encounter.   Disposition: No follow-ups on file.  [employer] speech-to-text software was used for transcription in this dictation.  Possible transcriptional errors can occur using Animal nutritionist.   Signed,  Jacques DASEN. Eriyanna Kofoed, MD   Outpatient Encounter Medications as of 08/19/2024  Medication Sig   amLODipine  (NORVASC ) 10 MG tablet TAKE 1 TABLET BY MOUTH EVERYDAY AT BEDTIME   aspirin  EC 81 MG tablet Take 1 tablet (81 mg total) by mouth daily. Swallow whole.   atorvastatin  (LIPITOR ) 80 MG tablet TAKE  1 TABLET BY MOUTH DAILY AT 6 PM.   carvedilol  (COREG ) 25 MG tablet TAKE 1 TABLET (25 MG TOTAL) BY MOUTH TWICE A DAY WITH MEALS   Cholecalciferol  (VITAMIN D3) 50 MCG (2000 UT) TABS Take 2,000 Units by mouth daily.   clopidogrel  (PLAVIX ) 75 MG tablet TAKE 1 TABLET BY MOUTH EVERY DAY    doxylamine , Sleep, (UNISOM ) 25 MG tablet Take 25 mg by mouth at bedtime as needed for sleep.   Dulaglutide  (TRULICITY ) 4.5 MG/0.5ML SOAJ Inject 4.5 mg as directed once a week.   Evolocumab  (REPATHA  SURECLICK) 140 MG/ML SOAJ Inject 140 mg into the skin every 14 (fourteen) days.   FARXIGA 10 MG TABS tablet Take 10 mg by mouth daily.   ferrous sulfate  325 (65 FE) MG tablet TAKE 1 TABLET BY MOUTH EVERY DAY WITH BREAKFAST   furosemide  (LASIX ) 40 MG tablet TAKE 1 TABLET BY MOUTH EVERY DAY   glucose blood (ONETOUCH ULTRA) test strip 1 each by Other route 2 (two) times daily. LAST REFILL SCHEDULE PHYSICAL   isosorbide  mononitrate (IMDUR ) 60 MG 24 hr tablet TAKE 1 TABLET BY MOUTH EVERY DAY   Magnesium  200 MG TABS Take 200 mg by mouth daily.   nitroGLYCERIN  (NITROSTAT ) 0.4 MG SL tablet Place 1 tablet (0.4 mg total) under the tongue every 5 (five) minutes as needed for chest pain.   ondansetron  (ZOFRAN -ODT) 4 MG disintegrating tablet TAKE 1 TABLET BY MOUTH EVERY 8 HOURS AS NEEDED FOR NAUSEA AND VOMITING   pantoprazole  (PROTONIX ) 40 MG tablet TAKE 1 TABLET BY MOUTH EVERY DAY   spironolactone  (ALDACTONE ) 25 MG tablet TAKE 1 TABLET (25 MG TOTAL) BY MOUTH DAILY.   vitamin B-12 (CYANOCOBALAMIN ) 500 MCG tablet Take 2 tablets (1,000 mcg total) by mouth daily.   [EXPIRED] triamcinolone  acetonide (KENALOG -40) injection 40 mg    No facility-administered encounter medications on file as of 08/19/2024.

## 2024-08-20 ENCOUNTER — Other Ambulatory Visit: Payer: Self-pay | Admitting: Internal Medicine

## 2024-09-10 ENCOUNTER — Ambulatory Visit (HOSPITAL_COMMUNITY)
Admission: RE | Admit: 2024-09-10 | Discharge: 2024-09-10 | Disposition: A | Discharge status: Home / Self Care | Source: Ambulatory Visit | Attending: Nephrology | Admitting: Nephrology

## 2024-09-10 VITALS — BP 150/80 | HR 62 | Temp 97.3°F | Resp 16

## 2024-09-10 DIAGNOSIS — N184 Chronic kidney disease, stage 4 (severe): Secondary | ICD-10-CM | POA: Diagnosis not present

## 2024-09-10 DIAGNOSIS — D631 Anemia in chronic kidney disease: Secondary | ICD-10-CM | POA: Insufficient documentation

## 2024-09-10 LAB — RENAL FUNCTION PANEL
Albumin: 3.4 g/dL — ABNORMAL LOW (ref 3.5–5.0)
Anion gap: 11 (ref 5–15)
BUN: 23 mg/dL (ref 8–23)
CO2: 24 mmol/L (ref 22–32)
Calcium: 8.2 mg/dL — ABNORMAL LOW (ref 8.9–10.3)
Chloride: 103 mmol/L (ref 98–111)
Creatinine, Ser: 2.34 mg/dL — ABNORMAL HIGH (ref 0.44–1.00)
GFR, Estimated: 21 mL/min — ABNORMAL LOW (ref >60)
Glucose, Bld: 142 mg/dL — ABNORMAL HIGH (ref 70–99)
Phosphorus: 3.8 mg/dL (ref 2.5–4.6)
Potassium: 3.4 mmol/L — ABNORMAL LOW (ref 3.5–5.1)
Sodium: 138 mmol/L (ref 135–145)

## 2024-09-10 LAB — IRON AND TIBC
Iron: 51 ug/dL (ref 28–170)
Saturation Ratios: 22 % (ref 10.4–31.8)
TIBC: 232 ug/dL — ABNORMAL LOW (ref 250–450)
UIBC: 181 ug/dL

## 2024-09-10 LAB — POCT HEMOGLOBIN-HEMACUE: Hemoglobin: 10.1 g/dL — ABNORMAL LOW (ref 12.0–15.0)

## 2024-09-10 LAB — FERRITIN: Ferritin: 414 ng/mL — ABNORMAL HIGH (ref 11–307)

## 2024-09-10 MED ORDER — EPOETIN ALFA-EPBX 20000 UNIT/ML IJ SOLN
20000.0000 [IU] | Freq: Once | INTRAMUSCULAR | Status: AC
  Administered 2024-09-10: 20000 [IU] via SUBCUTANEOUS

## 2024-09-10 MED ORDER — EPOETIN ALFA-EPBX 20000 UNIT/ML IJ SOLN
INTRAMUSCULAR | Status: AC
  Filled 2024-09-10: qty 1

## 2024-09-11 LAB — PTH, INTACT AND CALCIUM
Calcium, Total (PTH): 8.6 mg/dL — ABNORMAL LOW (ref 8.7–10.3)
PTH: 96 pg/mL — ABNORMAL HIGH (ref 15–65)

## 2024-09-29 ENCOUNTER — Encounter: Payer: Self-pay | Admitting: Pharmacist

## 2024-09-29 NOTE — Progress Notes (Signed)
 Pharmacy Quality Measure Review  This patient is appearing on a report for being at risk of failing the adherence measure for cholesterol (statin) medications this calendar year.   Medication: atorvastatin  80 mg Last fill date: 05/03/24 for 90 day supply  Insurance report was not up to date. No action needed at this time.  Medication has been refilled as of 09/28/24 x90 ds.

## 2024-10-22 ENCOUNTER — Inpatient Hospital Stay (HOSPITAL_COMMUNITY)
Admission: RE | Admit: 2024-10-22 | Discharge: 2024-10-22 | Disposition: A | Source: Ambulatory Visit | Attending: Nephrology

## 2024-10-22 VITALS — BP 129/78 | HR 72 | Temp 97.7°F | Resp 16

## 2024-10-22 DIAGNOSIS — E785 Hyperlipidemia, unspecified: Secondary | ICD-10-CM | POA: Diagnosis not present

## 2024-10-22 DIAGNOSIS — E1122 Type 2 diabetes mellitus with diabetic chronic kidney disease: Secondary | ICD-10-CM | POA: Diagnosis not present

## 2024-10-22 DIAGNOSIS — I129 Hypertensive chronic kidney disease with stage 1 through stage 4 chronic kidney disease, or unspecified chronic kidney disease: Secondary | ICD-10-CM | POA: Diagnosis not present

## 2024-10-22 DIAGNOSIS — N184 Chronic kidney disease, stage 4 (severe): Secondary | ICD-10-CM | POA: Insufficient documentation

## 2024-10-22 DIAGNOSIS — I2510 Atherosclerotic heart disease of native coronary artery without angina pectoris: Secondary | ICD-10-CM | POA: Diagnosis not present

## 2024-10-22 DIAGNOSIS — D631 Anemia in chronic kidney disease: Secondary | ICD-10-CM | POA: Diagnosis not present

## 2024-10-22 LAB — POCT HEMOGLOBIN-HEMACUE: Hemoglobin: 10.2 g/dL — ABNORMAL LOW (ref 12.0–15.0)

## 2024-10-22 MED ORDER — EPOETIN ALFA-EPBX 20000 UNIT/ML IJ SOLN
INTRAMUSCULAR | Status: AC
  Filled 2024-10-22: qty 1

## 2024-10-22 MED ORDER — EPOETIN ALFA-EPBX 20000 UNIT/ML IJ SOLN
20000.0000 [IU] | Freq: Once | INTRAMUSCULAR | Status: AC
  Administered 2024-10-22: 20000 [IU] via SUBCUTANEOUS

## 2024-10-23 ENCOUNTER — Telehealth (HOSPITAL_COMMUNITY): Payer: Self-pay | Admitting: Pharmacy Technician

## 2024-10-23 ENCOUNTER — Other Ambulatory Visit (HOSPITAL_COMMUNITY): Payer: Self-pay | Admitting: Nephrology

## 2024-10-23 NOTE — Telephone Encounter (Signed)
 Auth Submission: NO AUTH NEEDED Site of care: [facility] Payer: [employer] MEDICARE Medication & CPT/J Code(s) submitted: Venofer  (Iron  Sucrose) J1756 Diagnosis Code: D63.1 Route of submission (phone, fax, portal):  Phone # Fax # Auth type: Buy/Bill HB Units/visits requested: 200mg  x 2 doses Reference number:  Approval from: 10/23/2024 to 01/16/25    Dagoberto Armour, CPhT [facility] Phone: [phone] 10/23/2024

## 2024-11-04 ENCOUNTER — Ambulatory Visit (INDEPENDENT_AMBULATORY_CARE_PROVIDER_SITE_OTHER): Payer: Medicare PPO

## 2024-11-04 VITALS — Ht 65.0 in | Wt 259.0 lb

## 2024-11-04 DIAGNOSIS — Z0000 Encounter for general adult medical examination without abnormal findings: Secondary | ICD-10-CM | POA: Diagnosis not present

## 2024-11-04 NOTE — Progress Notes (Cosign Needed)
 Chief Complaint  Patient presents with   Medicare Wellness     Subjective:   Alicia [name] is a 78 y.o. female who presents for a Medicare Annual Wellness Visit.  Visit info / Clinical Intake: Medicare Wellness Visit Type:: Subsequent Annual Wellness Visit Persons participating in visit and providing information:: patient Medicare Wellness Visit Mode:: Telephone If telephone:: video declined Since this visit was completed virtually, some vitals may be partially provided or unavailable. Missing vitals are due to the limitations of the virtual format.: Unable to obtain vitals - no equipment If Telephone or Video please confirm:: I connected with patient using audio/video enable telemedicine. I verified patient identity with two identifiers, discussed telehealth limitations, and patient agreed to proceed. Patient Location:: home Provider Location:: office Interpreter Needed?: No Pre-visit prep was completed: yes AWV questionnaire completed by patient prior to visit?: no Living arrangements:: with family/others Patient's Overall Health Status Rating: (!) fair Typical amount of pain: some Does pain affect daily life?: (!) yes Are you currently prescribed opioids?: no  Dietary Habits and Nutritional Risks How many meals a day?: 2 Eats fruit and vegetables daily?: yes Most meals are obtained by: preparing own meals Diabetic:: (!) yes Any non-healing wounds?: no How often do you check your BS?: as needed Would you like to be referred to a Nutritionist or for Diabetic Management? : no  Functional Status Activities of Daily Living (to include ambulation/medication): Independent Ambulation: Independent with device- listed below Home Assistive Devices/Equipment: Cane Medication Administration: Independent Home Management (perform basic housework or laundry): Independent Manage your own finances?: yes Primary transportation is: family / friends Concerns about vision?: (!) yes (needs  new glasses:going to make appt) Concerns about hearing?: (!) yes Uses hearing aids?: (!) yes Hear whispered voice?: (!) no *in-person visit only*  Fall Screening Falls in the past year?: 1 Number of falls in past year: 0 Was there an injury with Fall?: 0 Fall Risk Category Calculator: 1 Patient Fall Risk Level: Low Fall Risk  Fall Risk Patient at Risk for Falls Due to: Impaired balance/gait; Impaired mobility; Orthopedic patient Fall risk Follow up: Falls evaluation completed; Education provided; Falls prevention discussed  Home and Transportation Safety: All rugs have non-skid backing?: N/A, no rugs All stairs or steps have railings?: N/A, no stairs Grab bars in the bathtub or shower?: yes Have non-skid surface in bathtub or shower?: yes Good home lighting?: yes Regular seat belt use?: yes Hospital stays in the last year:: no  Cognitive Assessment Difficulty concentrating, remembering, or making decisions? : no Will 6CIT or Mini Cog be Completed: yes What year is it?: 0 points What month is it?: 0 points Give patient an address phrase to remember (5 components): [address] [place]  About what time is it?: 0 points Count backwards from 20 to 1: 0 points Say the months of the year in reverse: 0 points Repeat the address phrase from earlier: 2 points 6 CIT Score: 2 points  Advance Directives (For Healthcare) Does Patient Have a Medical Advance Directive?: No Would patient like information on creating a medical advance directive?: No - Patient declined  Reviewed/Updated  Reviewed/Updated: Reviewed All (Medical, Surgical, Family, Medications, Allergies, Care Teams, Patient Goals)    Allergies (verified) Iodinated contrast media and Sulfa antibiotics   Current Medications (verified) Outpatient Encounter Medications as of 11/04/2024  Medication Sig   amLODipine  (NORVASC ) 10 MG tablet TAKE 1 TABLET BY MOUTH EVERYDAY AT BEDTIME   aspirin  EC 81 MG tablet Take 1  tablet (81 mg total) by mouth daily. Swallow whole.   atorvastatin  (LIPITOR ) 80 MG tablet TAKE 1 TABLET BY MOUTH DAILY AT 6 PM.   carvedilol  (COREG ) 25 MG tablet TAKE 1 TABLET (25 MG TOTAL) BY MOUTH TWICE A DAY WITH MEALS   Cholecalciferol  (VITAMIN D3) 50 MCG (2000 UT) TABS Take 2,000 Units by mouth daily.   clopidogrel  (PLAVIX ) 75 MG tablet TAKE 1 TABLET BY MOUTH EVERY DAY   doxylamine , Sleep, (UNISOM ) 25 MG tablet Take 25 mg by mouth at bedtime as needed for sleep.   Dulaglutide  (TRULICITY ) 4.5 MG/0.5ML SOAJ Inject 4.5 mg as directed once a week.   Evolocumab  (REPATHA  SURECLICK) 140 MG/ML SOAJ Inject 140 mg into the skin every 14 (fourteen) days.   FARXIGA 10 MG TABS tablet Take 10 mg by mouth daily.   furosemide  (LASIX ) 40 MG tablet TAKE 1 TABLET BY MOUTH EVERY DAY   glucose blood (ONETOUCH ULTRA) test strip 1 each by Other route 2 (two) times daily. LAST REFILL SCHEDULE PHYSICAL   isosorbide  mononitrate (IMDUR ) 60 MG 24 hr tablet TAKE 1 TABLET BY MOUTH EVERY DAY   Magnesium  200 MG TABS Take 200 mg by mouth daily.   nitroGLYCERIN  (NITROSTAT ) 0.4 MG SL tablet Place 1 tablet (0.4 mg total) under the tongue every 5 (five) minutes as needed for chest pain.   ondansetron  (ZOFRAN -ODT) 4 MG disintegrating tablet TAKE 1 TABLET BY MOUTH EVERY 8 HOURS AS NEEDED FOR NAUSEA AND VOMITING   pantoprazole  (PROTONIX ) 40 MG tablet TAKE 1 TABLET BY MOUTH EVERY DAY   spironolactone  (ALDACTONE ) 25 MG tablet TAKE 1 TABLET (25 MG TOTAL) BY MOUTH DAILY.   vitamin B-12 (CYANOCOBALAMIN ) 500 MCG tablet Take 2 tablets (1,000 mcg total) by mouth daily.   ferrous sulfate  325 (65 FE) MG tablet TAKE 1 TABLET BY MOUTH EVERY DAY WITH BREAKFAST (Patient not taking: Reported on 11/04/2024)   No facility-administered encounter medications on file as of 11/04/2024.    History: Past Medical History:  Diagnosis Date   Anemia    Arthritis    right knee (10/21/2015)   CAD (coronary artery disease)    a. Cath 10/21/15: s/p  DES to RCA: moderate diffuse stenosis of mod diagonal, diffuse irregularity LCx and LAD. 2D echo 10/21/15: mod LVH, EF 60-65%, no RWMA, calcified MV, mod-severe LAE. 03/18/17 PCI with DES--> 1 diag   Cataract    CKD (chronic kidney disease), stage IV (HCC)    Diabetes (HCC)    Essential hypertension    Family history of adverse reaction to anesthesia    oldest sister, Meade, related to brain formation at back of head; when they put her to sleep it's hard to wake her up   GERD (gastroesophageal reflux disease)    History of blood transfusion    related to ruptured tubal pregnancy   Hyperlipidemia    Hypertensive heart disease    Morbid obesity (HCC)    Stroke (cerebrum) (HCC)    Tubular adenoma of colon    Type II diabetes mellitus (HCC)    Past Surgical History:  Procedure Laterality Date   ABDOMINAL HYSTERECTOMY  1988   fibroid   APPENDECTOMY  1985   BARIATRIC SURGERY  1984   BIOPSY  07/18/2020   Procedure: BIOPSY;  Surgeon: Albertus Gordy HERO, MD;  Location: [facility];  Service: Gastroenterology;;   CARDIAC CATHETERIZATION N/A 10/21/2015   Procedure: Right/Left Heart Cath and Coronary Angiography;  Surgeon: Ozell Fell, MD;  Location: [facility] INVASIVE CV LAB;  Service:  Cardiovascular;  Laterality: N/A;   CHOLECYSTECTOMY OPEN  1984   COLONOSCOPY WITH PROPOFOL  N/A 07/18/2020   Procedure: COLONOSCOPY WITH PROPOFOL ;  Surgeon: Albertus Gordy HERO, MD;  Location: [facility];  Service: Gastroenterology;  Laterality: N/A;   CORONARY PRESSURE/FFR STUDY N/A 09/04/2019   Procedure: INTRAVASCULAR PRESSURE WIRE/FFR STUDY;  Surgeon: Mady Bruckner, MD;  Location: [facility] INVASIVE CV LAB;  Service: Cardiovascular;  Laterality: N/A;   CORONARY STENT INTERVENTION N/A 03/18/2017   Procedure: Coronary Stent Intervention;  Surgeon: Alm LELON Clay, MD;  Location: [facility] INVASIVE CV LAB;  Service: Cardiovascular;  Laterality: N/A;   CORONARY STENT PLACEMENT  03/18/2017   A OPTIMIZE STUDY Drug Eluting Stent (2.5 mm  18 mm - post-dilated to 2.7 mm) was successfully placed   ECTOPIC PREGNANCY SURGERY     ENDOSCOPIC MUCOSAL RESECTION N/A 01/23/2021   Procedure: ENDOSCOPIC MUCOSAL RESECTION;  Surgeon: Wilhelmenia Aloha Raddle., MD;  Location: [facility];  Service: Gastroenterology;  Laterality: N/A;   ESOPHAGOGASTRODUODENOSCOPY (EGD) WITH PROPOFOL  N/A 07/18/2020   Procedure: ESOPHAGOGASTRODUODENOSCOPY (EGD) WITH PROPOFOL ;  Surgeon: Albertus Gordy HERO, MD;  Location: [facility];  Service: Gastroenterology;  Laterality: N/A;   ESOPHAGOGASTRODUODENOSCOPY (EGD) WITH PROPOFOL  N/A 01/23/2021   Procedure: ESOPHAGOGASTRODUODENOSCOPY (EGD) WITH PROPOFOL ;  Surgeon: Wilhelmenia Aloha Raddle., MD;  Location: [facility];  Service: Gastroenterology;  Laterality: N/A;   EUS N/A 01/23/2021   Procedure: UPPER ENDOSCOPIC ULTRASOUND (EUS) RADIAL;  Surgeon: Wilhelmenia Aloha Raddle., MD;  Location: [facility];  Service: Gastroenterology;  Laterality: N/A;   HEMOSTASIS CLIP PLACEMENT  01/23/2021   Procedure: HEMOSTASIS CLIP PLACEMENT;  Surgeon: Wilhelmenia Aloha Raddle., MD;  Location: [facility];  Service: Gastroenterology;;   LEFT HEART CATH AND CORONARY ANGIOGRAPHY N/A 03/18/2017   Procedure: Left Heart Cath and Coronary Angiography;  Surgeon: Alm LELON Clay, MD;  Location: [facility] INVASIVE CV LAB;  Service: Cardiovascular;  Laterality: N/A;   LEFT HEART CATH AND CORONARY ANGIOGRAPHY N/A 09/04/2019   Procedure: LEFT HEART CATH AND CORONARY ANGIOGRAPHY;  Surgeon: Mady Bruckner, MD;  Location: [facility] INVASIVE CV LAB;  Service: Cardiovascular;  Laterality: N/A;   POLYPECTOMY  07/18/2020   Procedure: POLYPECTOMY;  Surgeon: Albertus Gordy HERO, MD;  Location: [facility];  Service: Gastroenterology;;   REDUCTION MAMMAPLASTY Bilateral ~ 1976   SUBMUCOSAL LIFTING INJECTION  01/23/2021   Procedure: SUBMUCOSAL LIFTING INJECTION;  Surgeon: Wilhelmenia Aloha Raddle., MD;  Location: [facility];  Service: Gastroenterology;;   TONSILLECTOMY     Family History   Problem Relation Age of Onset   Diabetes Mother    Heart attack Mother 26       2007   Hypertension Mother    Hyperlipidemia Mother    Heart disease Mother    Heart attack Father    Stroke Father 70   Hypertension Father    Hyperlipidemia Father    Cancer Sister        Gastric cancer in 2013   Stomach cancer Sister        in the cavity behind the stomach   Diabetes Sister    Diabetes Sister    Diabetes Sister    Migraines Daughter        brain surgery   Migraines Daughter    Hypertension Son        weight loss corrected that   Breast cancer Neg Hx    Colon cancer Neg Hx    Inflammatory bowel disease Neg Hx    Liver disease Neg Hx    Esophageal cancer Neg Hx  Pancreatic cancer Neg Hx    Rectal cancer Neg Hx    Social History   Occupational History   Occupation: Teacher/retired  Tobacco Use   Smoking status: Never   Smokeless tobacco: Never  Vaping Use   Vaping status: Never Used  Substance and Sexual Activity   Alcohol use: No   Drug use: No   Sexual activity: Not Currently   Tobacco Counseling Counseling given: Not Answered  SDOH Screenings   Food Insecurity: No Food Insecurity (11/04/2024)  Housing: Unknown (11/04/2024)  Transportation Needs: No Transportation Needs (11/04/2024)  Utilities: Not At Risk (11/04/2024)  Alcohol Screen: Low Risk (11/04/2024)  Depression (PHQ2-9): Low Risk (11/04/2024)  Financial Resource Strain: Low Risk (11/04/2024)  Physical Activity: Insufficiently Active (11/04/2024)  Social Connections: Moderately Integrated (11/04/2024)  Stress: No Stress Concern Present (11/04/2024)  Tobacco Use: Low Risk (11/04/2024)  Health Literacy: Adequate Health Literacy (11/04/2024)   See flowsheets for full screening details  Depression Screen PHQ 2 & 9 Depression Scale- Over the past 2 weeks, how often have you been bothered by any of the following problems? Little interest or pleasure in doing things: 0 Feeling down, depressed, or  hopeless (PHQ Adolescent also includes...irritable): 0 PHQ-2 Total Score: 0 Trouble falling or staying asleep, or sleeping too much: 3 Feeling tired or having little energy: 2 Poor appetite or overeating (PHQ Adolescent also includes...weight loss): 3 Feeling bad about yourself - or that you are a failure or have let yourself or your family down: 0 Trouble concentrating on things, such as reading the newspaper or watching television (PHQ Adolescent also includes...like school work): 1 Moving or speaking so slowly that other people could have noticed. Or the opposite - being so fidgety or restless that you have been moving around a lot more than usual: 0 Thoughts that you would be better off dead, or of hurting yourself in some way: 0 PHQ-9 Total Score: 9 If you checked off any problems, how difficult have these problems made it for you to do your work, take care of things at home, or get along with other people?: Somewhat difficult     Goals Addressed             This Visit's Progress    COMPLETED: DIET - EAT MORE FRUITS AND VEGETABLES       I want to lose a littl emore weight               Objective:    Today's Vitals   11/04/24 0856  Weight: 259 lb (117.5 kg)  Height: 5' 5 (1.651 m)   Body mass index is 43.1 kg/m.  Hearing/Vision screen Vision Screening - Comments:: Not UTD w/visits to [facility] (has to make appts) Immunizations and Health Maintenance Health Maintenance  Topic Date Due   OPHTHALMOLOGY EXAM  09/26/2022   DTaP/Tdap/Td (2 - Td or Tdap) 10/09/2022   Zoster Vaccines- Shingrix (2 of 2) 11/27/2022   COVID-19 Vaccine (5 - 2025-26 season) 07/20/2024   Medicare Annual Wellness (AWV)  11/03/2024   Influenza Vaccine  02/16/2025 (Originally 06/19/2024)   HEMOGLOBIN A1C  11/20/2024   Diabetic kidney evaluation - Urine ACR  05/20/2025   FOOT EXAM  05/20/2025   Diabetic kidney evaluation - eGFR measurement  09/10/2025   Pneumococcal Vaccine: 50+ Years   Completed   Bone Density Scan  Completed   Hepatitis C Screening  Completed   Meningococcal B Vaccine  Aged Out   Mammogram  Discontinued   Colonoscopy  Discontinued        Assessment/Plan:  This is a routine wellness examination for Suleika.  Patient Care Team: Wendee Lynwood HERO, NP as PCP - General (Pain Medicine) Okey Vina GAILS, MD as PCP - Cardiology (Cardiology) Okey Vina GAILS, MD as Consulting Physician (Cardiology) [facility], [facility]  I have personally reviewed and noted the following in the patients chart:   Medical and social history Use of alcohol, tobacco or illicit drugs  Current medications and supplements including opioid prescriptions. Functional ability and status Nutritional status Physical activity Advanced directives List of other physicians Hospitalizations, surgeries, and ER visits in previous 12 months Vitals Screenings to include cognitive, depression, and falls Referrals and appointments  No orders of the defined types were placed in this encounter.  In addition, I have reviewed and discussed with patient certain preventive protocols, quality metrics, and best practice recommendations. A written personalized care plan for preventive services as well as general preventive health recommendations were provided to patient.   Erminio LITTIE Saris, LPN   [id number]   No follow-ups on file.  After Visit Summary: (MyChart) Due to this being a telephonic visit, the after visit summary with patients personalized plan was offered to patient via MyChart   Nurse Notes: No voiced or noted concerns at this time Patient advised to keep follow-up appointment with PCP (FEB 2026) Appointment(s) made: (AWV/CPR Dec 2026) HM Addressed: Vaccines Due: does not desire Covid and Shingles vaccine at this time  PT DOES INQUIRE IF SHE CAN GET A CONTINUOUS GLUCOSE MONITOR FOR BLOOD SUGAR READINGS

## 2024-11-04 NOTE — Patient Instructions (Signed)
 Ms. Puett,  Thank you for taking the time for your Medicare Wellness Visit. I appreciate your continued commitment to your health goals. Please review the care plan we discussed, and feel free to reach out if I can assist you further.  Please note that Annual Wellness Visits do not include a physical exam. Some assessments may be limited, especially if the visit was conducted virtually. If needed, we may recommend an in-person follow-up with your provider.  Ongoing Care Seeing your primary care provider every 3 to 6 months helps us  monitor your health and provide consistent, personalized care.   Referrals If a referral was made during today's visit and you haven't received any updates within two weeks, please contact the referred provider directly to check on the status.  Recommended Screenings:  Health Maintenance  Topic Date Due   Eye exam for diabetics  09/26/2022   DTaP/Tdap/Td vaccine (2 - Td or Tdap) 10/09/2022   Zoster (Shingles) Vaccine (2 of 2) 11/27/2022   COVID-19 Vaccine (5 - 2025-26 season) 07/20/2024   Medicare Annual Wellness Visit  11/03/2024   Flu Shot  02/16/2025*   Hemoglobin A1C  11/20/2024   Yearly kidney health urinalysis for diabetes  05/20/2025   Complete foot exam   05/20/2025   Yearly kidney function blood test for diabetes  09/10/2025   Pneumococcal Vaccine for age over 41  Completed   Osteoporosis screening with Bone Density Scan  Completed   Hepatitis C Screening  Completed   Meningitis B Vaccine  Aged Out   Breast Cancer Screening  Discontinued   Colon Cancer Screening  Discontinued  *Topic was postponed. The date shown is not the original due date.       11/04/2024    9:06 AM  Advanced Directives  Does Patient Have a Medical Advance Directive? No  Would patient like information on creating a medical advance directive? No - Patient declined    Vision: Annual vision screenings are recommended for early detection of glaucoma, cataracts, and  diabetic retinopathy. These exams can also reveal signs of chronic conditions such as diabetes and high blood pressure.  Dental: Annual dental screenings help detect early signs of oral cancer, gum disease, and other conditions linked to overall health, including heart disease and diabetes.

## 2024-11-23 ENCOUNTER — Telehealth (HOSPITAL_COMMUNITY): Payer: Self-pay

## 2024-11-23 NOTE — Telephone Encounter (Signed)
 Auth Submission: APPROVED Site of care: Site of care: [facility] Payer: [employer] Medicare Medication & CPT/J Code(s) submitted: Retacrit  (V4893) Diagnosis Code: D63.1, N18.4 Route of submission (phone, fax, portal): portal Phone # Fax # Auth type: Buy/Bill HB Units/visits requested: 20000 units q6weeks Reference number: [id number] Approval from: 11/20/24 to 11/18/25

## 2024-11-24 ENCOUNTER — Ambulatory Visit: Payer: Self-pay

## 2024-11-24 NOTE — Telephone Encounter (Signed)
 FYI Only or Action Required?: FYI only for provider: appointment scheduled on 11/25/24.  Patient was last seen in primary care on 08/19/2024 by Watt Mirza, MD.  Called Nurse Triage reporting Knee Pain.  Symptoms began several days ago.  Interventions attempted: OTC medications: Advil .  Symptoms are: unchanged.  Triage Disposition: See HCP Within 4 Hours (Or PCP Triage)  Patient/caregiver understands and will follow disposition?: Yes         Copied from CRM #[id number]. Topic: Clinical - Red Word Triage >> Nov 24, 2024 10:04 AM Maisie BROCKS wrote: Red Word that prompted transfer to Nurse Triage: extreme knee pain Reason for Disposition  [1] SEVERE pain (e.g., excruciating, unable to walk) AND [2] not improved after 2 hours of pain medicine  Answer Assessment - Initial Assessment Questions 1. LOCATION and RADIATION: Where is the pain located?      Left knee   2. QUALITY: What does the pain feel like?  (e.g., sharp, dull, aching, burning)     Pulling sensation   3. SEVERITY: How bad is the pain? What does it keep you from doing?   (Scale 1-10; or mild, moderate, severe)     10/10  4. ONSET: When did the pain start? Does it come and go, or is it there all the time?     3 days ago  5. RECURRENT: Have you had this pain before? If Yes, ask: When, and what happened then?     Yes, chronic pain. Gets injections q3 months   6. SETTING: Has there been any recent work, exercise or other activity that involved that part of the body?      No   7. AGGRAVATING FACTORS: What makes the knee pain worse? (e.g., walking, climbing stairs, running)     Walking, sitting longer than 5 minutes    8. ASSOCIATED SYMPTOMS: Is there any swelling or redness of the knee?     No   9. OTHER SYMPTOMS: Do you have any other symptoms? (e.g., calf pain, chest pain, difficulty breathing, fever)     No     Patient called in to triage with complaints of left knee pain.  This has  been ongoing for x 3 days  The patient stated she fell over the New Years holiday.  For home care, the patient is taking Advil. Mild relief noted.    Soonest available appointment scheduled for further evaluation on 1/7; Patient agrees with the plan of care, and will reach out if symptoms worsen or persist.  Protocols used: Knee Pain-A-AH

## 2024-11-24 NOTE — Telephone Encounter (Signed)
 noted

## 2024-11-24 NOTE — Telephone Encounter (Signed)
 Next Appt With Family Medicine Rubie Crandall, NP) 11/25/2024 at 12:00 PM

## 2024-11-25 ENCOUNTER — Other Ambulatory Visit: Payer: Self-pay | Admitting: Nurse Practitioner

## 2024-11-25 ENCOUNTER — Ambulatory Visit: Admitting: Nurse Practitioner

## 2024-11-25 ENCOUNTER — Ambulatory Visit (INDEPENDENT_AMBULATORY_CARE_PROVIDER_SITE_OTHER)
Admission: RE | Admit: 2024-11-25 | Discharge: 2024-11-25 | Disposition: A | Discharge status: Home / Self Care | Source: Ambulatory Visit | Attending: Nurse Practitioner | Admitting: Nurse Practitioner

## 2024-11-25 VITALS — BP 122/88 | HR 80 | Temp 98.2°F | Ht 65.0 in

## 2024-11-25 DIAGNOSIS — W19XXXA Unspecified fall, initial encounter: Secondary | ICD-10-CM

## 2024-11-25 DIAGNOSIS — E119 Type 2 diabetes mellitus without complications: Secondary | ICD-10-CM

## 2024-11-25 DIAGNOSIS — M25562 Pain in left knee: Secondary | ICD-10-CM

## 2024-11-25 LAB — POCT GLYCOSYLATED HEMOGLOBIN (HGB A1C): Hemoglobin A1C: 7 % — AB (ref 4.0–5.6)

## 2024-11-25 NOTE — Patient Instructions (Signed)
 Nice to see you today  I want to see you in 4 months for your diabetes I will be in touch with the labs once I have them  You can use over the counter voltaren gel and tylenol  as needed You can also use heat and ice

## 2024-11-25 NOTE — Progress Notes (Signed)
 "  Established Patient Office Visit  Subjective   Patient ID: Alicia [name], female    DOB: January 16, 1946  Age: 79 y.o. MRN: [id number]  Chief Complaint  Patient presents with   Follow-up   Knee Pain    Pt complains of sharp pain in left knee that started after a fall on 11/20/24.   Medication Refill    Carvedilol  and pantoprazole .     Discussed the use of AI scribe software for clinical note transcription with the patient, who gave verbal consent to proceed.  History of Present Illness Alicia [name] is a 79 year old female with diabetes who presents with knee pain following a fall.  She fell in the kitchen the day after [employer], landing on a hardwood floor. Although she did not land directly on her knee, she had to get on her knees to use a chair to stand up. Since the fall, her left knee pain has worsened, and she experienced significant pain yesterday, making it difficult to walk. The pain is located in the front of the knee and sometimes radiates down the leg. She has been using heat and ice, which provided some relief, and has been taking Advil for pain management.  She has a history of diabetes with well-controlled blood sugar levels, ranging between 88 and 110. She checks her blood sugar once a day, and her last A1c was 7.0. She is currently taking Farxiga and Trulicity  4.5 mg once a week. No hypoglycemic episodes or dizziness. Regular bowel movements despite the use of Trulicity .  No numbness, tingling, fever, chills, chest pain, shortness of breath, or issues with bowel movements.     Review of Systems  Constitutional:  Negative for chills and fever.  Respiratory:  Negative for shortness of breath.   Cardiovascular:  Negative for chest pain.  Gastrointestinal:  Negative for abdominal pain and constipation.  Musculoskeletal:  Positive for falls and joint pain.  Neurological:  Negative for tingling and weakness.      Objective:     BP 122/88   Pulse 80   Temp 98.2 F  (36.8 C) (Oral)   Ht 5' 5 (1.651 m)   SpO2 95%   BMI 43.10 kg/m  BP Readings from Last 3 Encounters:  11/25/24 122/88  10/22/24 129/78  09/10/24 (!) 150/80   Wt Readings from Last 3 Encounters:  11/04/24 259 lb (117.5 kg)  06/26/24 262 lb 4.8 oz (119 kg)  05/20/24 263 lb (119.3 kg)   SpO2 Readings from Last 3 Encounters:  11/25/24 95%  10/22/24 99%  09/10/24 100%      Physical Exam Vitals and nursing note reviewed.  Constitutional:      Appearance: Normal appearance.  Cardiovascular:     Rate and Rhythm: Normal rate and regular rhythm.     Heart sounds: Normal heart sounds.  Pulmonary:     Effort: Pulmonary effort is normal.     Breath sounds: Normal breath sounds.  Abdominal:     General: Bowel sounds are normal.  Musculoskeletal:        General: Tenderness present.     Left knee: Bony tenderness and crepitus present. Tenderness present over the medial joint line and lateral joint line. Normal pulse.       Legs:  Neurological:     Mental Status: She is alert.      Results for orders placed or performed in visit on 11/25/24  POCT glycosylated hemoglobin (Hb A1C)  Result Value Ref Range  Hemoglobin A1C 7.0 (A) 4.0 - 5.6 %   HbA1c POC (<> result, manual entry)     HbA1c, POC (prediabetic range)     HbA1c, POC (controlled diabetic range)        The ASCVD Risk score (Arnett DK, et al., 2019) failed to calculate for the following reasons:   Risk score cannot be calculated because patient has a medical history suggesting prior/existing ASCVD   * - Cholesterol units were assumed    Assessment & Plan:   Problem List Items Addressed This Visit       Endocrine   Type 2 diabetes mellitus without complication, without long-term current use of insulin  (HCC) - Primary   Relevant Orders   POCT glycosylated hemoglobin (Hb A1C) (Completed)   Other Visit Diagnoses       Fall, initial encounter         Acute pain of left knee          Assessment and  Plan Assessment & Plan Left knee pain with possible ligamentous injury Left knee pain post-fall with anterior tenderness and popping sounds, suggesting possible ligamentous injury. Differential includes ligamentous injury or arthritis. - Ordered x-ray of left knee to assess for ligamentous injury or bone chip. - Advised Tylenol  for pain management instead of Advil due to kidney concerns. - Recommended topical Voltaren gel for pain relief. - Continue use of heat and ice for comfort.  Type 2 diabetes mellitus Well-controlled with current regimen. Blood glucose levels range from 88 to 110 mg/dL, J8r is 7%. - Continue current diabetes management regimen with Farxiga and Trulicity . - Scheduled follow-up in four months for diabetes management.  Return in about 4 months (around 03/25/2025) for DM recheck.    Adina Crandall, NP  "

## 2024-11-27 ENCOUNTER — Other Ambulatory Visit: Payer: Self-pay | Admitting: Nurse Practitioner

## 2024-11-27 ENCOUNTER — Ambulatory Visit: Payer: Self-pay | Admitting: Nurse Practitioner

## 2024-12-03 ENCOUNTER — Ambulatory Visit (HOSPITAL_COMMUNITY)
Admission: RE | Admit: 2024-12-03 | Discharge: 2024-12-03 | Disposition: A | Discharge status: Home / Self Care | Source: Ambulatory Visit | Attending: Nephrology | Admitting: Nephrology

## 2024-12-03 ENCOUNTER — Ambulatory Visit (HOSPITAL_COMMUNITY)
Admission: RE | Admit: 2024-12-03 | Discharge: 2024-12-03 | Disposition: A | Source: Ambulatory Visit | Attending: Nephrology

## 2024-12-03 VITALS — BP 176/97 | HR 69 | Temp 97.6°F | Resp 16

## 2024-12-03 DIAGNOSIS — N184 Chronic kidney disease, stage 4 (severe): Secondary | ICD-10-CM | POA: Diagnosis present

## 2024-12-03 DIAGNOSIS — D631 Anemia in chronic kidney disease: Secondary | ICD-10-CM | POA: Insufficient documentation

## 2024-12-03 LAB — RENAL FUNCTION PANEL
Albumin: 4.4 g/dL (ref 3.5–5.0)
Anion gap: 12 (ref 5–15)
BUN: 36 mg/dL — ABNORMAL HIGH (ref 8–23)
CO2: 28 mmol/L (ref 22–32)
Calcium: 9.2 mg/dL (ref 8.9–10.3)
Chloride: 100 mmol/L (ref 98–111)
Creatinine, Ser: 2.84 mg/dL — ABNORMAL HIGH (ref 0.44–1.00)
GFR, Estimated: 17 mL/min — ABNORMAL LOW
Glucose, Bld: 98 mg/dL (ref 70–99)
Phosphorus: 3.8 mg/dL (ref 2.5–4.6)
Potassium: 4.1 mmol/L (ref 3.5–5.1)
Sodium: 140 mmol/L (ref 135–145)

## 2024-12-03 LAB — IRON AND TIBC
Iron: 70 ug/dL (ref 28–170)
Saturation Ratios: 25 % (ref 10.4–31.8)
TIBC: 283 ug/dL (ref 250–450)
UIBC: 212 ug/dL

## 2024-12-03 LAB — FERRITIN: Ferritin: 638 ng/mL — ABNORMAL HIGH (ref 11–307)

## 2024-12-03 MED ORDER — EPOETIN ALFA-EPBX 20000 UNIT/ML IJ SOLN
INTRAMUSCULAR | Status: AC
  Filled 2024-12-03: qty 1

## 2024-12-03 MED ORDER — IRON SUCROSE 200 MG IVPB - SIMPLE MED
200.0000 mg | Freq: Once | Status: AC
  Administered 2024-12-03: 200 mg via INTRAVENOUS

## 2024-12-03 MED ORDER — IRON SUCROSE 200 MG IVPB - SIMPLE MED
Status: AC
  Filled 2024-12-03: qty 110

## 2024-12-03 MED ORDER — EPOETIN ALFA-EPBX 20000 UNIT/ML IJ SOLN
20000.0000 [IU] | Freq: Once | INTRAMUSCULAR | Status: AC
  Administered 2024-12-03: 20000 [IU] via SUBCUTANEOUS

## 2024-12-04 LAB — PTH, INTACT AND CALCIUM
Calcium, Total (PTH): 9.1 mg/dL (ref 8.7–10.3)
PTH: 70 pg/mL — ABNORMAL HIGH (ref 15–65)

## 2024-12-04 LAB — POCT HEMOGLOBIN-HEMACUE: Hemoglobin: 11.1 g/dL — ABNORMAL LOW (ref 12.0–15.0)

## 2024-12-07 ENCOUNTER — Other Ambulatory Visit (HOSPITAL_COMMUNITY): Payer: Self-pay | Admitting: Nephrology

## 2024-12-07 NOTE — Progress Notes (Signed)
 Received udated Retacrit  orders  Dose: 10000 units SQ every 6 weeks  Labs: POCT HgB every visit; renal panel, ferritin, iron  panel and iPTH every 6 weeks  Last dose 12/03/2024 Next dose is due 01/14/25  Sherry Pennant, PharmD, MPH, BCPS, CPP Clinical Pharmacist

## 2024-12-09 ENCOUNTER — Telehealth: Payer: Self-pay | Admitting: Hematology and Oncology

## 2024-12-09 NOTE — Telephone Encounter (Signed)
 Spoke to pt about moving appt up

## 2024-12-10 ENCOUNTER — Ambulatory Visit (HOSPITAL_COMMUNITY)
Admission: RE | Admit: 2024-12-10 | Discharge: 2024-12-10 | Disposition: A | Discharge status: Home / Self Care | Source: Ambulatory Visit | Attending: Nephrology | Admitting: Nephrology

## 2024-12-10 VITALS — BP 161/87 | HR 65 | Temp 97.6°F | Resp 16

## 2024-12-10 DIAGNOSIS — N184 Chronic kidney disease, stage 4 (severe): Secondary | ICD-10-CM

## 2024-12-10 DIAGNOSIS — D631 Anemia in chronic kidney disease: Secondary | ICD-10-CM

## 2024-12-10 MED ORDER — IRON SUCROSE 200 MG IVPB - SIMPLE MED
200.0000 mg | Freq: Once | Status: AC
  Administered 2024-12-10: 200 mg via INTRAVENOUS

## 2024-12-10 MED ORDER — IRON SUCROSE 200 MG IVPB - SIMPLE MED
Status: AC
  Filled 2024-12-10: qty 110

## 2024-12-10 MED ORDER — EPOETIN ALFA-EPBX 10000 UNIT/ML IJ SOLN: 10000.0000 [IU] | Freq: Once | INTRAMUSCULAR | Status: DC

## 2024-12-13 ENCOUNTER — Other Ambulatory Visit: Payer: Self-pay | Admitting: Internal Medicine

## 2024-12-17 ENCOUNTER — Other Ambulatory Visit: Payer: Self-pay | Admitting: Internal Medicine

## 2024-12-18 MED ORDER — FUROSEMIDE 40 MG PO TABS: 40.0000 mg | ORAL_TABLET | Freq: Every day | ORAL | 3 refills | Status: AC

## 2024-12-18 NOTE — Telephone Encounter (Signed)
 Patient is aware she needs an appointment  Last office  04/2023. Medication refilled

## 2024-12-23 ENCOUNTER — Telehealth: Payer: Self-pay | Admitting: Hematology and Oncology

## 2024-12-23 NOTE — Telephone Encounter (Signed)
 Pt called to have appt rescheduled

## 2024-12-25 ENCOUNTER — Ambulatory Visit: Admitting: Hematology and Oncology

## 2024-12-25 ENCOUNTER — Other Ambulatory Visit

## 2024-12-25 ENCOUNTER — Inpatient Hospital Stay: Admitting: Hematology and Oncology

## 2024-12-25 ENCOUNTER — Inpatient Hospital Stay

## 2024-12-30 ENCOUNTER — Ambulatory Visit: Admitting: Nurse Practitioner

## 2025-01-14 ENCOUNTER — Inpatient Hospital Stay: Admitting: Hematology and Oncology

## 2025-01-14 ENCOUNTER — Encounter (HOSPITAL_COMMUNITY)

## 2025-01-14 ENCOUNTER — Inpatient Hospital Stay

## 2025-11-05 ENCOUNTER — Ambulatory Visit

## 2025-11-05 ENCOUNTER — Encounter: Admitting: Nurse Practitioner
